# Patient Record
Sex: Male | Born: 1945 | Race: Black or African American | Hispanic: No | Smoking: Former smoker
Health system: Southern US, Community
[De-identification: ages and names within clinical notes are randomized; demographics above are authoritative.]

## PROBLEM LIST (undated history)

## (undated) DIAGNOSIS — C160 Malignant neoplasm of cardia: Secondary | ICD-10-CM

## (undated) DIAGNOSIS — E785 Hyperlipidemia, unspecified: Secondary | ICD-10-CM

## (undated) DIAGNOSIS — G4730 Sleep apnea, unspecified: Secondary | ICD-10-CM

## (undated) DIAGNOSIS — K5790 Diverticulosis of intestine, part unspecified, without perforation or abscess without bleeding: Secondary | ICD-10-CM

## (undated) DIAGNOSIS — R011 Cardiac murmur, unspecified: Secondary | ICD-10-CM

## (undated) DIAGNOSIS — A048 Other specified bacterial intestinal infections: Secondary | ICD-10-CM

## (undated) DIAGNOSIS — Z9221 Personal history of antineoplastic chemotherapy: Secondary | ICD-10-CM

## (undated) DIAGNOSIS — Z923 Personal history of irradiation: Secondary | ICD-10-CM

## (undated) DIAGNOSIS — D126 Benign neoplasm of colon, unspecified: Secondary | ICD-10-CM

## (undated) DIAGNOSIS — D649 Anemia, unspecified: Secondary | ICD-10-CM

## (undated) DIAGNOSIS — E119 Type 2 diabetes mellitus without complications: Secondary | ICD-10-CM

## (undated) DIAGNOSIS — I10 Essential (primary) hypertension: Secondary | ICD-10-CM

## (undated) DIAGNOSIS — K219 Gastro-esophageal reflux disease without esophagitis: Secondary | ICD-10-CM

## (undated) DIAGNOSIS — G709 Myoneural disorder, unspecified: Secondary | ICD-10-CM

## (undated) DIAGNOSIS — I4891 Unspecified atrial fibrillation: Secondary | ICD-10-CM

## (undated) HISTORY — DX: Sleep apnea, unspecified: G47.30

## (undated) HISTORY — PX: TONSILLECTOMY: SUR1361

## (undated) HISTORY — DX: Essential (primary) hypertension: I10

## (undated) HISTORY — DX: Personal history of irradiation: Z92.3

## (undated) HISTORY — DX: Diverticulosis of intestine, part unspecified, without perforation or abscess without bleeding: K57.90

## (undated) HISTORY — PX: ELBOW BURSA SURGERY: SHX615

## (undated) HISTORY — DX: Unspecified atrial fibrillation: I48.91

## (undated) HISTORY — DX: Other specified bacterial intestinal infections: A04.8

## (undated) HISTORY — DX: Myoneural disorder, unspecified: G70.9

## (undated) HISTORY — DX: Malignant neoplasm of cardia: C16.0

## (undated) HISTORY — DX: Cardiac murmur, unspecified: R01.1

## (undated) HISTORY — DX: Type 2 diabetes mellitus without complications: E11.9

## (undated) HISTORY — DX: Anemia, unspecified: D64.9

## (undated) HISTORY — PX: COLONOSCOPY: SHX174

## (undated) HISTORY — DX: Benign neoplasm of colon, unspecified: D12.6

## (undated) HISTORY — DX: Gastro-esophageal reflux disease without esophagitis: K21.9

## (undated) HISTORY — DX: Personal history of antineoplastic chemotherapy: Z92.21

## (undated) HISTORY — DX: Hyperlipidemia, unspecified: E78.5

## (undated) HISTORY — PX: CATARACT EXTRACTION: SUR2

---

## 1978-01-31 HISTORY — PX: LEG SURGERY: SHX1003

## 2002-10-08 ENCOUNTER — Emergency Department (HOSPITAL_COMMUNITY): Admission: EM | Admit: 2002-10-08 | Discharge: 2002-10-09 | Payer: Self-pay | Admitting: Emergency Medicine

## 2002-10-08 ENCOUNTER — Encounter: Payer: Self-pay | Admitting: Emergency Medicine

## 2002-12-11 ENCOUNTER — Encounter: Payer: Self-pay | Admitting: Internal Medicine

## 2004-03-12 ENCOUNTER — Emergency Department (HOSPITAL_COMMUNITY): Admission: EM | Admit: 2004-03-12 | Discharge: 2004-03-12 | Payer: Self-pay | Admitting: Emergency Medicine

## 2004-03-15 ENCOUNTER — Ambulatory Visit (HOSPITAL_BASED_OUTPATIENT_CLINIC_OR_DEPARTMENT_OTHER): Admission: RE | Admit: 2004-03-15 | Discharge: 2004-03-15 | Payer: Self-pay | Admitting: *Deleted

## 2004-03-19 ENCOUNTER — Ambulatory Visit: Payer: Self-pay

## 2004-06-24 ENCOUNTER — Ambulatory Visit: Payer: Self-pay | Admitting: Internal Medicine

## 2004-10-20 ENCOUNTER — Ambulatory Visit: Payer: Self-pay | Admitting: Internal Medicine

## 2004-10-27 ENCOUNTER — Ambulatory Visit: Payer: Self-pay | Admitting: Internal Medicine

## 2005-02-16 ENCOUNTER — Ambulatory Visit: Payer: Self-pay | Admitting: Internal Medicine

## 2005-02-17 ENCOUNTER — Observation Stay (HOSPITAL_COMMUNITY): Admission: EM | Admit: 2005-02-17 | Discharge: 2005-02-18 | Payer: Self-pay | Admitting: Emergency Medicine

## 2005-02-25 ENCOUNTER — Ambulatory Visit: Payer: Self-pay | Admitting: Internal Medicine

## 2005-03-01 ENCOUNTER — Encounter: Admission: RE | Admit: 2005-03-01 | Discharge: 2005-05-30 | Payer: Self-pay | Admitting: Internal Medicine

## 2005-03-11 ENCOUNTER — Ambulatory Visit: Payer: Self-pay | Admitting: Internal Medicine

## 2005-03-19 ENCOUNTER — Encounter: Payer: Self-pay | Admitting: Pulmonary Disease

## 2005-03-19 ENCOUNTER — Ambulatory Visit (HOSPITAL_BASED_OUTPATIENT_CLINIC_OR_DEPARTMENT_OTHER): Admission: RE | Admit: 2005-03-19 | Discharge: 2005-03-19 | Payer: Self-pay | Admitting: Internal Medicine

## 2005-03-21 ENCOUNTER — Ambulatory Visit: Payer: Self-pay

## 2005-03-21 ENCOUNTER — Encounter: Payer: Self-pay | Admitting: Cardiology

## 2005-03-23 ENCOUNTER — Ambulatory Visit: Payer: Self-pay | Admitting: Pulmonary Disease

## 2005-04-14 ENCOUNTER — Ambulatory Visit: Payer: Self-pay | Admitting: Pulmonary Disease

## 2005-04-25 ENCOUNTER — Ambulatory Visit: Payer: Self-pay | Admitting: Internal Medicine

## 2005-05-12 ENCOUNTER — Ambulatory Visit: Payer: Self-pay | Admitting: Pulmonary Disease

## 2005-10-18 ENCOUNTER — Ambulatory Visit: Payer: Self-pay | Admitting: Internal Medicine

## 2005-10-25 ENCOUNTER — Ambulatory Visit: Payer: Self-pay | Admitting: Internal Medicine

## 2005-11-11 ENCOUNTER — Ambulatory Visit: Payer: Self-pay | Admitting: Pulmonary Disease

## 2006-01-02 ENCOUNTER — Ambulatory Visit: Payer: Self-pay | Admitting: Internal Medicine

## 2006-11-02 ENCOUNTER — Ambulatory Visit: Payer: Self-pay | Admitting: Pulmonary Disease

## 2007-10-31 DIAGNOSIS — E785 Hyperlipidemia, unspecified: Secondary | ICD-10-CM | POA: Insufficient documentation

## 2007-10-31 DIAGNOSIS — D126 Benign neoplasm of colon, unspecified: Secondary | ICD-10-CM | POA: Insufficient documentation

## 2007-10-31 DIAGNOSIS — I480 Paroxysmal atrial fibrillation: Secondary | ICD-10-CM | POA: Insufficient documentation

## 2007-10-31 DIAGNOSIS — Z8679 Personal history of other diseases of the circulatory system: Secondary | ICD-10-CM

## 2007-11-01 ENCOUNTER — Ambulatory Visit: Payer: Self-pay | Admitting: Pulmonary Disease

## 2008-02-07 ENCOUNTER — Ambulatory Visit: Payer: Self-pay | Admitting: Internal Medicine

## 2008-02-21 ENCOUNTER — Encounter: Payer: Self-pay | Admitting: Internal Medicine

## 2008-02-21 ENCOUNTER — Ambulatory Visit: Payer: Self-pay | Admitting: Internal Medicine

## 2008-02-21 LAB — HM COLONOSCOPY

## 2008-02-23 ENCOUNTER — Encounter: Payer: Self-pay | Admitting: Internal Medicine

## 2008-06-09 ENCOUNTER — Telehealth: Payer: Self-pay | Admitting: Internal Medicine

## 2008-06-10 ENCOUNTER — Telehealth: Payer: Self-pay | Admitting: Internal Medicine

## 2008-09-22 ENCOUNTER — Ambulatory Visit: Payer: Self-pay | Admitting: Internal Medicine

## 2008-09-22 LAB — CONVERTED CEMR LAB
Alkaline Phosphatase: 50 units/L (ref 39–117)
BUN: 18 mg/dL (ref 6–23)
Basophils Absolute: 0 10*3/uL (ref 0.0–0.1)
Bilirubin, Direct: 0.1 mg/dL (ref 0.0–0.3)
CO2: 29 meq/L (ref 19–32)
Calcium: 9.5 mg/dL (ref 8.4–10.5)
Chloride: 106 meq/L (ref 96–112)
Cholesterol: 129 mg/dL (ref 0–200)
Creatinine, Ser: 1.3 mg/dL (ref 0.4–1.5)
Eosinophils Absolute: 0.2 10*3/uL (ref 0.0–0.7)
HDL: 48.4 mg/dL (ref >39.00)
Hemoglobin, Urine: NEGATIVE
LDL Cholesterol: 62 mg/dL (ref 0–99)
Lymphocytes Relative: 33.6 % (ref 12.0–46.0)
MCHC: 34 g/dL (ref 30.0–36.0)
MCV: 87.8 fL (ref 78.0–100.0)
Monocytes Absolute: 0.7 10*3/uL (ref 0.1–1.0)
Neutrophils Relative %: 51.4 % (ref 43.0–77.0)
Nitrite: NEGATIVE
PSA: 0.72 ng/mL (ref 0.10–4.00)
Platelets: 188 10*3/uL (ref 150.0–400.0)
RDW: 13.4 % (ref 11.5–14.6)
Specific Gravity, Urine: 1.025 (ref 1.000–1.030)
Total Bilirubin: 0.9 mg/dL (ref 0.3–1.2)
Total CHOL/HDL Ratio: 3
Total Protein, Urine: NEGATIVE mg/dL
Total Protein: 7.3 g/dL (ref 6.0–8.3)
Triglycerides: 91 mg/dL (ref 0.0–149.0)
pH: 6 (ref 5.0–8.0)

## 2008-09-29 ENCOUNTER — Ambulatory Visit: Payer: Self-pay | Admitting: Internal Medicine

## 2008-10-31 ENCOUNTER — Ambulatory Visit: Payer: Self-pay | Admitting: Pulmonary Disease

## 2008-10-31 DIAGNOSIS — G4733 Obstructive sleep apnea (adult) (pediatric): Secondary | ICD-10-CM | POA: Insufficient documentation

## 2009-01-31 DIAGNOSIS — A048 Other specified bacterial intestinal infections: Secondary | ICD-10-CM

## 2009-01-31 HISTORY — DX: Other specified bacterial intestinal infections: A04.8

## 2009-06-16 ENCOUNTER — Ambulatory Visit: Payer: Self-pay | Admitting: Internal Medicine

## 2009-06-16 DIAGNOSIS — R079 Chest pain, unspecified: Secondary | ICD-10-CM | POA: Insufficient documentation

## 2009-06-16 DIAGNOSIS — K219 Gastro-esophageal reflux disease without esophagitis: Secondary | ICD-10-CM | POA: Insufficient documentation

## 2009-06-17 ENCOUNTER — Telehealth: Payer: Self-pay | Admitting: Internal Medicine

## 2009-06-17 LAB — CONVERTED CEMR LAB
ALT: 19 units/L (ref 0–53)
Alkaline Phosphatase: 51 units/L (ref 39–117)
Basophils Relative: 0.5 % (ref 0.0–3.0)
Bilirubin Urine: NEGATIVE
Bilirubin, Direct: 0.1 mg/dL (ref 0.0–0.3)
Chloride: 105 meq/L (ref 96–112)
Creatinine, Ser: 1.2 mg/dL (ref 0.4–1.5)
Eosinophils Relative: 2.7 % (ref 0.0–5.0)
Hemoglobin, Urine: NEGATIVE
Hemoglobin: 12.4 g/dL — ABNORMAL LOW (ref 13.0–17.0)
Lymphocytes Relative: 28.6 % (ref 12.0–46.0)
MCV: 85.5 fL (ref 78.0–100.0)
Monocytes Absolute: 0.6 10*3/uL (ref 0.1–1.0)
Neutrophils Relative %: 57.4 % (ref 43.0–77.0)
RBC: 4.19 M/uL — ABNORMAL LOW (ref 4.22–5.81)
Sodium: 141 meq/L (ref 135–145)
Total Protein, Urine: NEGATIVE mg/dL
Total Protein: 7.2 g/dL (ref 6.0–8.3)
Urine Glucose: NEGATIVE mg/dL
WBC: 5.6 10*3/uL (ref 4.5–10.5)

## 2009-06-18 ENCOUNTER — Telehealth (INDEPENDENT_AMBULATORY_CARE_PROVIDER_SITE_OTHER): Payer: Self-pay | Admitting: *Deleted

## 2009-06-22 ENCOUNTER — Ambulatory Visit: Payer: Self-pay

## 2009-06-22 ENCOUNTER — Encounter (HOSPITAL_COMMUNITY): Admission: RE | Admit: 2009-06-22 | Discharge: 2009-09-02 | Payer: Self-pay | Admitting: Internal Medicine

## 2009-06-22 ENCOUNTER — Ambulatory Visit: Payer: Self-pay | Admitting: Cardiology

## 2009-06-30 ENCOUNTER — Ambulatory Visit: Payer: Self-pay | Admitting: Internal Medicine

## 2009-06-30 DIAGNOSIS — A048 Other specified bacterial intestinal infections: Secondary | ICD-10-CM | POA: Insufficient documentation

## 2009-08-17 ENCOUNTER — Ambulatory Visit: Payer: Self-pay | Admitting: Internal Medicine

## 2009-10-30 ENCOUNTER — Ambulatory Visit: Payer: Self-pay | Admitting: Pulmonary Disease

## 2009-11-13 ENCOUNTER — Ambulatory Visit: Payer: Self-pay | Admitting: Internal Medicine

## 2009-11-13 DIAGNOSIS — R42 Dizziness and giddiness: Secondary | ICD-10-CM | POA: Insufficient documentation

## 2009-11-15 ENCOUNTER — Emergency Department (HOSPITAL_COMMUNITY): Admission: EM | Admit: 2009-11-15 | Discharge: 2009-11-15 | Payer: Self-pay | Admitting: Emergency Medicine

## 2009-12-11 ENCOUNTER — Telehealth: Payer: Self-pay | Admitting: Internal Medicine

## 2009-12-22 ENCOUNTER — Ambulatory Visit: Payer: Self-pay | Admitting: Internal Medicine

## 2009-12-22 LAB — CONVERTED CEMR LAB
Alkaline Phosphatase: 47 units/L (ref 39–117)
Basophils Relative: 0.6 % (ref 0.0–3.0)
Bilirubin, Direct: 0.1 mg/dL (ref 0.0–0.3)
CO2: 28 meq/L (ref 19–32)
Calcium: 9.6 mg/dL (ref 8.4–10.5)
Creatinine, Ser: 1.3 mg/dL (ref 0.4–1.5)
Eosinophils Absolute: 0.2 10*3/uL (ref 0.0–0.7)
HDL: 42.5 mg/dL (ref >39.00)
Hemoglobin, Urine: NEGATIVE
LDL Cholesterol: 63 mg/dL (ref 0–99)
Lymphocytes Relative: 39 % (ref 12.0–46.0)
MCHC: 33.1 g/dL (ref 30.0–36.0)
Neutrophils Relative %: 45.6 % (ref 43.0–77.0)
PSA: 0.91 ng/mL (ref 0.10–4.00)
RBC: 4.75 M/uL (ref 4.22–5.81)
Specific Gravity, Urine: 1.02 (ref 1.000–1.030)
Total Bilirubin: 0.4 mg/dL (ref 0.3–1.2)
Total CHOL/HDL Ratio: 3
Total Protein, Urine: NEGATIVE mg/dL
Total Protein: 6.7 g/dL (ref 6.0–8.3)
Triglycerides: 98 mg/dL (ref 0.0–149.0)
Urine Glucose: NEGATIVE mg/dL
WBC: 5.5 10*3/uL (ref 4.5–10.5)
pH: 6.5 (ref 5.0–8.0)

## 2009-12-31 ENCOUNTER — Ambulatory Visit: Payer: Self-pay | Admitting: Internal Medicine

## 2009-12-31 DIAGNOSIS — E162 Hypoglycemia, unspecified: Secondary | ICD-10-CM | POA: Insufficient documentation

## 2009-12-31 DIAGNOSIS — R7309 Other abnormal glucose: Secondary | ICD-10-CM | POA: Insufficient documentation

## 2009-12-31 DIAGNOSIS — H6120 Impacted cerumen, unspecified ear: Secondary | ICD-10-CM | POA: Insufficient documentation

## 2009-12-31 DIAGNOSIS — D485 Neoplasm of uncertain behavior of skin: Secondary | ICD-10-CM | POA: Insufficient documentation

## 2010-01-31 DIAGNOSIS — C160 Malignant neoplasm of cardia: Secondary | ICD-10-CM

## 2010-01-31 HISTORY — DX: Malignant neoplasm of cardia: C16.0

## 2010-02-02 ENCOUNTER — Ambulatory Visit
Admission: RE | Admit: 2010-02-02 | Discharge: 2010-02-02 | Payer: Self-pay | Source: Home / Self Care | Attending: Internal Medicine | Admitting: Internal Medicine

## 2010-02-02 ENCOUNTER — Encounter: Payer: Self-pay | Admitting: Internal Medicine

## 2010-03-02 NOTE — Assessment & Plan Note (Signed)
Summary: CPX / NWS  #   Vital Signs:  Patient profile:   65 year old male Height:      71 inches Weight:      224 pounds BMI:     31.35 Temp:     97.7 degrees F oral Pulse rate:   76 / minute Pulse rhythm:   regular Resp:     16 per minute BP sitting:   120 / 70  (left arm) Cuff size:   regular  Vitals Entered By: Lanier Prude, Beverly Gust) (December 31, 2009 10:38 AM) CC: CPX Is Patient Diabetic? No   Primary Care Provider:  Tresa Garter MD  CC:  CPX.  History of Present Illness: The patient presents for a preventive health examination   Current Medications (verified): 1)  Antara 130 Mg Caps (Fenofibrate Micronized) .... Take 1 Tablet By Mouth Once A Day 2)  Diltiazem Hcl Er Beads 180 Mg Xr24h-Cap (Diltiazem Hcl Er Beads) .... Take 1 Tablet By Mouth Once A Day 3)  Aspirin Low Dose 81 Mg Tabs (Aspirin) .... Take 1 Tablet By Mouth Once A Day 4)  Vitamin D3 1000 Unit  Tabs (Cholecalciferol) .Marland Kitchen.. 1 By Mouth Daily 5)  Meclizine Hcl 12.5 Mg Tabs (Meclizine Hcl) .Marland Kitchen.. 1-2 By Mouth Qid As Needed Dizziness  Allergies (verified): No Known Drug Allergies  Past History:  Past Surgical History: Last updated: 06/16/2009 Tonsillectomy  Family History: Last updated: 09/29/2008 No CAD  Social History: Last updated: 09/29/2008 pt is divorced. pt has children. Patient states former smoker.  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    Physical Exam  General:  Well-developed,well-nourished,in no acute distress; alert,appropriate and cooperative throughout examination Head:  Normocephalic and atraumatic without obvious abnormalities. No apparent alopecia or balding. Eyes:  No corneal or conjunctival inflammation noted. EOMI. Perrla.  Ears:  R wax Nose:  External nasal examination shows no deformity or inflammation. Nasal mucosa are pink and moist without lesions or exudates. Mouth:  Oral mucosa and oropharynx without lesions or exudates.  Teeth in good repair. Neck:  No deformities, masses, or tenderness noted. Lungs:  Normal respiratory effort, chest expands symmetrically. Lungs are clear to auscultation, no crackles or wheezes. Heart:  Normal rate and regular rhythm. S1 and S2 normal without gallop, murmur, click, rub or other extra sounds. Abdomen:  Bowel sounds positive,abdomen soft and non-tender without masses, organomegaly or hernias noted. Rectal:  No external abnormalities noted. Normal sphincter tone. No rectal masses or tenderness. Genitalia:  Testes bilaterally descended without nodularity, tenderness or masses. No scrotal masses or lesions. No penis lesions or urethral discharge. Prostate:  Prostate gland firm and smooth, no enlargement, nodularity, tenderness, mass, asymmetry or induration. Msk:  No deformity or scoliosis noted of thoracic or lumbar spine.   Pulses:  R and L carotid,radial,femoral,dorsalis pedis and posterior tibial pulses are full and equal bilaterally Extremities:  No clubbing, cyanosis, edema, or deformity noted with normal full range of motion  of all joints.   Neurologic:  wnl Skin:  R flank/abd dark mole 4 mm irreg Psych:  Cognition and judgment appear intact. Alert and cooperative with  normal attention span and concentration. No apparent delusions, illusions, hallucinations   Impression & Recommendations:  Problem # 1:  PHYSICAL EXAMINATION (ICD-V70.0) Assessment New Health and age related issues were discussed. Available screening tests and vaccinations were discussed as well. Healthy life style including good diet and exercise was discussed.  The labs were reviewed with the patient.  He had an EKG in Oct 2011 at [facility]  Problem # 2:  VERTIGO (ICD-780.4) resolved Assessment: Improved  His updated medication list for this problem includes:    Meclizine Hcl 12.5 Mg Tabs (Meclizine hcl) .Marland Kitchen... 1-2 by mouth qid as needed dizziness  Problem # 3:  ATRIAL FIBRILLATION, HX OF (ICD-V12.59) Assessment: Comment Only On the regimen of medicine(s) reflected in the chart    Problem # 4:  DYSLIPIDEMIA (ICD-272.4) Assessment: Unchanged  His updated medication list for this problem includes:    Antara 130 Mg Caps (Fenofibrate micronized) .Marland Kitchen... Take 1 tablet by mouth once a day  Labs Reviewed: SGOT: 28 (12/22/2009)   SGPT: 20 (12/22/2009)   HDL:42.50 (12/22/2009), 48.40 (09/22/2008)  LDL:63 (12/22/2009), 62 (09/22/2008)  Chol:125 (12/22/2009), 129 (09/22/2008)  Trig:98.0 (12/22/2009), 91.0 (09/22/2008)  Problem # 5:  HYPERGLYCEMIA (ICD-790.29) Assessment: New Loose wt Recheck in 6 months   Problem # 6:  CERUMEN IMPACTION (ICD-380.4) R irrigate at home  Problem # 7:  NEOPLASM OF UNCERTAIN BEHAVIOR OF SKIN (ICD-238.2) Assessment: New skin biopsy   Complete Medication List: 1)  Antara 130 Mg Caps (Fenofibrate micronized) .... Take 1 tablet by mouth once a day 2)  Diltiazem Hcl Er Beads 180 Mg Xr24h-cap (Diltiazem hcl er beads) .... Take 1 tablet by mouth once a day 3)  Aspirin Low Dose 81 Mg Tabs (Aspirin) .... Take 1 tablet by mouth once a day 4)  Vitamin D3 1000 Unit Tabs (Cholecalciferol) .Marland Kitchen.. 1 by mouth daily 5)  Meclizine Hcl 12.5 Mg Tabs (Meclizine hcl) .Marland Kitchen.. 1-2 by  mouth qid as needed dizziness  Other Orders: Zoster (Shingles) Vaccine Live [phone]) Admin 1st Vaccine (03474)  Patient Instructions: 1)  Skin bx w/me 1-2 months 2)  Please schedule a follow-up appointment in 6 months. 3)  BMP prior to visit, ICD-9: 4)  HbgA1C prior to visit, ICD-9:790.29 Prescriptions: MECLIZINE HCL 12.5 MG TABS (MECLIZINE HCL) 1-2 by mouth qid as needed dizziness  #60 x 1   Entered and Authorized by:   Tresa Garter MD   Signed by:   Tresa Garter MD on 12/31/2009   Method used:   Print then Give to Patient   RxID:   [id number] DILTIAZEM HCL ER BEADS 180 MG XR24H-CAP (DILTIAZEM HCL ER BEADS) Take 1 tablet by mouth once a day  #30 Capsule x 11   Entered and Authorized by:   Tresa Garter MD   Signed by:   Tresa Garter MD on 12/31/2009   Method used:   Print then Give to Patient   RxID:   [id number] ANTARA 130 MG CAPS (FENOFIBRATE MICRONIZED) Take 1 tablet by mouth once a day  #30 Capsule x 11   Entered and Authorized by:   Tresa Garter MD   Signed by:   Tresa Garter MD on 12/31/2009   Method used:   Print then Give to Patient   RxID:   [id number]    Orders Added:  1)  Est. Patient age 57-64 [62] 2)  Zoster (Shingles) Vaccine Live [phone] 3)  Admin 1st Vaccine [phone]   Immunization History:  Tetanus/Td Immunization History:    Tetanus/Td:  historical (03/18/2004)  Immunizations Administered:  Zostavax # 1:    Vaccine Type: Zostavax    Site: left deltoid    Mfr: Merck    Dose: 0.75ml    Route: [place]    Given by: Lanier Prude, CMA(AAMA)    Exp. Date: 09/04/2010    Lot #: [id number]    VIS given: 11/12/04 given December 31, 2009.    Immunization History:  Tetanus/Td Immunization History:    Tetanus/Td:  Historical (03/18/2004)  Immunizations Administered:  Zostavax # 1:    Vaccine Type: Zostavax    Site: left deltoid    Mfr: Merck    Dose: 0.18ml    Route: [place]    Given by: Lanier Prude, CMA(AAMA)    Exp. Date: 09/04/2010    Lot #: [id number]    VIS given: 11/12/04 given December 31, 2009.

## 2010-03-02 NOTE — Assessment & Plan Note (Signed)
Summary: THINGS SPINS WHEN LIE DOWN   STC   Vital Signs:  Patient profile:   65 year old male Height:      71 inches Weight:      224 pounds BMI:     31.35 Temp:     98.0 degrees F oral Pulse rate:   76 / minute Pulse rhythm:   regular Resp:     16 per minute BP sitting:   120 / 80  (left arm) Cuff size:   regular  Vitals Entered By: Lanier Prude, Beverly Gust) (November 13, 2009 2:44 PM) CC: dizziness while bending over and lying down Is Patient Diabetic? No   Primary Care Provider:  Georgina Quint Itzel Mckibbin MD  CC:  dizziness while bending over and lying down.  History of Present Illness: C/o spinning sensation with head turning since Tue - severe. No URI. No falls. No HA  Current Medications (verified): 1)  Antara 130 Mg Caps (Fenofibrate Micronized) .... Take 1 Tablet By Mouth Once A Day 2)  Diltiazem Hcl Er Beads 180 Mg Xr24h-Cap (Diltiazem Hcl Er Beads) .... Take 1 Tablet By Mouth Once A Day 3)  Aspirin Low Dose 81 Mg Tabs (Aspirin) .... Take 1 Tablet By Mouth Once A Day 4)  Vitamin D3 1000 Unit  Tabs (Cholecalciferol) .Marland Kitchen.. 1 By Mouth Daily  Allergies (verified): No Known Drug Allergies  Past History:  Past Medical History: Last updated: 06/30/2009 Current Problems:  ATRIAL FIBRILLATION, HX OF (ICD-V12.59) POLYP, COLON (ICD-211.3) DYSLIPIDEMIA (ICD-272.4) SLEEP APNEA (ICD-780.57) Colon 02/2008 Dr Marina Goodell Glaucoma Cataract GERD H pyloti (+) 2011  Social History: Last updated: 09/29/2008 pt is divorced. pt has children. Patient states former smoker.  Occupation:physical work  Review of Systems  The patient denies fever, chest pain, syncope, and headaches.    Physical Exam  General:  Well-developed,well-nourished,in no acute distress; alert,appropriate and cooperative throughout examination Ears:  External ear exam shows no significant lesions or deformities.  Otoscopic examination reveals clear canals, tympanic membranes are intact bilaterally without  bulging, retraction, inflammation or discharge. Hearing is grossly normal bilaterally. Nose:  External nasal examination shows no deformity or inflammation. Nasal mucosa are pink and moist without lesions or exudates. Mouth:  Oral mucosa and oropharynx without lesions or exudates.  Teeth in good repair. Lungs:  Normal respiratory effort, chest expands symmetrically. Lungs are clear to auscultation, no crackles or wheezes. Heart:  Normal rate and regular rhythm. S1 and S2 normal without gallop, murmur, click, rub or other extra sounds. Abdomen:  Bowel sounds positive,abdomen soft and non-tender without masses, organomegaly or hernias noted. Neurologic:  H-P (+) on R Skin:  Intact without suspicious lesions or rashes Psych:  Cognition and judgment appear intact. Alert and cooperative with normal attention span and concentration. No apparent delusions, illusions, hallucinations   Impression & Recommendations:  Problem # 1:  VERTIGO (ICD-780.4) BPV Assessment New See "Patient Instructions".  His updated medication list for this problem includes:    Meclizine Hcl 12.5 Mg Tabs (Meclizine hcl) .Marland Kitchen... 1-2 by mouth qid as needed dizziness  Problem # 2:  ATRIAL FIBRILLATION, HX OF (ICD-V12.59) Assessment: Comment Only Doing well  Complete Medication List: 1)  Antara 130 Mg Caps (Fenofibrate micronized) .... Take 1 tablet by mouth once a day 2)  Diltiazem Hcl Er Beads 180 Mg Xr24h-cap (Diltiazem hcl er beads) .... Take 1 tablet by mouth once a day 3)  Aspirin Low Dose 81 Mg Tabs (Aspirin) .... Take 1 tablet by mouth once a day 4)  Vitamin D3 1000 Unit Tabs (Cholecalciferol) .Marland Kitchen.. 1 by mouth daily 5)  Meclizine Hcl 12.5 Mg Tabs (Meclizine hcl) .Marland Kitchen.. 1-2 by mouth qid as needed dizziness  Patient Instructions: 1)  Francee Piccolo - Daroff exercise  2)  Call if you are not better in a reasonable amount of time or if worse.  Prescriptions: MECLIZINE HCL 12.5 MG TABS (MECLIZINE HCL) 1-2 by mouth qid as needed  dizziness  #60 x 1   Entered and Authorized by:   Tresa Garter MD   Signed by:   Tresa Garter MD on 11/13/2009   Method used:   Electronically to        [facility]  [address] [phone]* (retail)       [address]       [place], [place]  [address]       Ph: [phone]       Fax: [phone]   RxID:   [id number]

## 2010-03-02 NOTE — Assessment & Plan Note (Signed)
Summary: Cardiology Nuclear Study  Nuclear Med Background Indications for Stress Test: Evaluation for Ischemia   History: Echo  History Comments: '07 Echo:normal LVF; h/o afib, OSA  Symptoms: Chest Pain, Chest Tightness  Symptoms Comments: Last episode of ZO:XWRU week   Nuclear Pre-Procedure Cardiac Risk Factors: History of Smoking, Lipids Caffeine/Decaff Intake: None NPO After: 10:00 PM Lungs: Clear IV 0.9% NS with Angio Cath: 18g     IV Site: (R) AC IV Started by: Stanton Kidney EMT-P Chest Size (in) 46     Height (in): 71 Weight (lb): 221 BMI: 30.93 Tech Comments: No a.m. meds. taken  Nuclear Med Study 1 or 2 day study:  1 day     Stress Test Type:  Stress Reading MD:  Willa Rough, MD     Referring MD:  Sonda Primes, MD Resting Radionuclide:  Technetium 106m Tetrofosmin     Resting Radionuclide Dose:  10.5 mCi  Stress Radionuclide:  Technetium 66m Tetrofosmin     Stress Radionuclide Dose:  33 mCi   Stress Protocol Exercise Time (min):  7:16 min     Max HR:  144 bpm     Predicted Max HR:  156 bpm  Max Systolic BP: 185 mm Hg     Percent Max HR:  92.31 %     METS: 9.1 Rate Pressure Product:  04540    Stress Test Technologist:  Rea College CMA-N     Nuclear Technologist:  Domenic Polite CNMT  Rest Procedure  Myocardial perfusion imaging was performed at rest 45 minutes following the intravenous administration of Myoview Technetium 84m Tetrofosmin.  Stress Procedure  The patient exercised for 7:16.  The patient stopped due to fatigue and denied any chest pain.  There were no diagnostic ST-T wave changes, only occasional PVC's with couplets and occasional PAC's.  Myoview was injected at peak exercise and myocardial perfusion imaging was performed after a brief delay.  QPS Raw Data Images:  Patient motion noted; appropriate software correction applied. Stress Images:  There is decreased activity in the inferoapical wall Rest Images:  Similar to stress Subtraction  (SDS):  Some reversibility in the inferior wall Transient Ischemic Dilatation:  .95  (Normal <1.22)  Lung/Heart Ratio:  .27  (Normal <0.45)  Quantitative Gated Spect Images QGS EDV:  111 ml QGS ESV:  42 ml QGS EF:  62 % QGS cine images:  Good motion.  Findings Abnormal      Overall Impression  Exercise Capacity: Fair exercise capacity. BP Response: Normal blood pressure response. Clinical Symptoms: SOB ECG Impression: No significant ST segment change suggestive of ischemia. Overall Impression Comments: There is interference from visceral nuclear activity that may affect the findings. However, the images and quantitative data suggests that the is inferior ischemia and possibly mild scar also.

## 2010-03-02 NOTE — Progress Notes (Signed)
Summary: RESULTS  Phone Note From Other Clinic   Summary of Call: Pt informed of results, rx sent in  Initial call taken by: Lamar Sprinkles, CMA,  Jun 17, 2009 5:30 PM    New/Updated Medications: PREVPAC  MISC (AMOXICILL-CLARITHRO-LANSOPRAZ) as directed Prescriptions: PREVPAC  MISC (AMOXICILL-CLARITHRO-LANSOPRAZ) as directed  #1 x 0   Entered by:   Lamar Sprinkles, CMA   Authorized by:   Tresa Garter MD   Signed by:   Lamar Sprinkles, CMA on 06/17/2009   Method used:   [facility]        [facility]  [address] [phone]* (retail)       [address]       [place], [place]  [address]       Ph: [phone]       Fax: [phone]   RxID:   [id number]

## 2010-03-02 NOTE — Progress Notes (Signed)
Summary: Nuclear Pre-Procedure  Phone Note Outgoing Call Call back at [facility] Phone [phone]   Call placed by: Stanton Kidney, EMT-P,  Jun 18, 2009 12:16 PM Call placed to: Patient Action Taken: Phone Call Completed Summary of Call: Reviewed information on Myoview Information Sheet (see scanned document for further details).  Spoke with Patient.    Nuclear Med Background Indications for Stress Test: Evaluation for Ischemia   History: Echo  History Comments: '07 Echo: NL LV Fx  Symptoms: Chest Pain, Chest Tightness    Nuclear Pre-Procedure Cardiac Risk Factors: History of Smoking, Lipids Height (in): 71

## 2010-03-02 NOTE — Assessment & Plan Note (Signed)
Summary: 2 WK FU  STC   Vital Signs:  Patient profile:   65 year old male Height:      71 inches (180.34 cm) Weight:      219.2 pounds (99.64 kg) O2 Sat:      96 % on Room air Temp:     98.6 degrees F (37.00 degrees C) oral Pulse rate:   54 / minute BP sitting:   110 / 72  (left arm) Cuff size:   large  Vitals Entered By: Orlan Leavens (Jun 30, 2009 10:24 AM)  O2 Flow:  Room air CC: 2 week f/u Is Patient Diabetic? No Pain Assessment Patient in pain? no        CC:  2 week f/u.  History of Present Illness: F/u CP, GERD and H pilori (+) test Doing better after Rx No CP lately  Current Medications (verified): 1)  Antara 130 Mg Caps (Fenofibrate Micronized) .... Take 1 Tablet By Mouth Once A Day 2)  Diltiazem Hcl Er Beads 180 Mg Xr24h-Cap (Diltiazem Hcl Er Beads) .... Take 1 Tablet By Mouth Once A Day 3)  Aspirin Low Dose 81 Mg Tabs (Aspirin) .... Take 1 Tablet By Mouth Once A Day 4)  Vitamin D3 1000 Unit  Tabs (Cholecalciferol) .Marland Kitchen.. 1 By Mouth Daily 5)  Vimovo 500-20 Mg Tbec (Naproxen-Esomeprazole) .Marland Kitchen.. 1 By Mouth  Two Times A Day Pc As Needed Pain 6)  Prevpac  Misc (Amoxicill-Clarithro-Lansopraz) .... As Directed  Allergies (verified): No Known Drug Allergies  Past History:  Social History: Last updated: 09/29/2008 pt is divorced. pt has children. Patient states former smoker.  Occupation:physical work  Past Medical History: Current Problems:  ATRIAL FIBRILLATION, HX OF (ICD-V12.59) POLYP, COLON (ICD-211.3) DYSLIPIDEMIA (ICD-272.4) SLEEP APNEA (ICD-780.57) Colon 02/2008 Dr Marina Goodell Glaucoma Cataract GERD H pyloti (+) 2011  Review of Systems  The patient denies chest pain, dyspnea on exertion, abdominal pain, and melena.    Physical Exam  General:  Well-developed,well-nourished,in no acute distress; alert,appropriate and cooperative throughout examination Nose:  External nasal examination shows no deformity or inflammation. Nasal mucosa are pink and moist  without lesions or exudates. Mouth:  Oral mucosa and oropharynx without lesions or exudates.  Teeth in good repair. Neck:  No deformities, masses, or tenderness noted. Lungs:  Normal respiratory effort, chest expands symmetrically. Lungs are clear to auscultation, no crackles or wheezes. Heart:  Normal rate and regular rhythm. S1 and S2 normal without gallop, murmur, click, rub or other extra sounds. Abdomen:  Bowel sounds positive,abdomen soft and non-tender without masses, organomegaly or hernias noted. Msk:  No deformity or scoliosis noted of thoracic or lumbar spine.   Extremities:  No clubbing, cyanosis, edema, or deformity noted with normal full range of motion of all joints.   Neurologic:  No cranial nerve deficits noted. Station and gait are normal. Plantar reflexes are down-going bilaterally. DTRs are symmetrical throughout. Sensory, motor and coordinative functions appear intact. Skin:  Intact without suspicious lesions or rashes Psych:  Cognition and judgment appear intact. Alert and cooperative with normal attention span and concentration. No apparent delusions, illusions, hallucinations   Impression & Recommendations:  Problem # 1:  CHEST PAIN (ICD-786.50) Assessment Improved CL with possible inferior ischemia - appt w/Dr Tenny Craw is pending   Problem # 2:  ATRIAL FIBRILLATION, HX OF (ICD-V12.59) Assessment: Comment Only On prescription drug  therapy   Problem # 3:  GERD (ICD-530.81) Assessment: Improved  His updated medication list for this problem includes:    Prevpac  Misc (Amoxicill-clarithro-lansopraz) .Marland Kitchen... As directed    Omeprazole 40 Mg Cpdr (Omeprazole) .Marland Kitchen... 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 Take 1 tablet by mouth once a day 2)  Diltiazem Hcl Er Beads 180 Mg Xr24h-cap (Diltiazem  hcl er beads) .... Take 1 tablet by mouth once a day 3)  Aspirin Low Dose 81 Mg Tabs (Aspirin) .... Take 1 tablet by mouth once a day 4)  Vitamin D3 1000 Unit Tabs (Cholecalciferol) .Marland Kitchen.. 1 by mouth daily 5)  Prevpac Misc (Amoxicill-clarithro-lansopraz) .... As directed 6)  Omeprazole 40 Mg Cpdr (Omeprazole) .Marland Kitchen.. 1 by mouth qam for indigestion x 6 wks then prn  Patient Instructions: 1)  Please schedule a follow-up appointment in 6 months. Prescriptions: OMEPRAZOLE 40 MG CPDR (OMEPRAZOLE) 1 by mouth qam for indigestion x 6 wks then prn  #30 x 3   Entered and Authorized by:   Tresa Garter MD   Signed by:   Tresa Garter MD on 06/30/2009   Method used:   Electronically to        [facility] [phone]* (retail)       [address]       [place], [place]  [address]       Ph: [phone]       Fax: [phone]   RxID:   [id number]

## 2010-03-02 NOTE — Progress Notes (Signed)
Summary: rx refill  Phone Note Refill Request   Refills Requested: Medication #1:  ANTARA 130 MG CAPS Take 1 tablet by mouth once a day  Medication #2:  DILTIAZEM HCL ER BEADS 180 MG XR24H-CAP Take 1 tablet by mouth once a day [facility]  [id number]   Method Requested: Telephone to Pharmacy Initial call taken by: Roe Coombs,  December 11, 2009 11:42 AM  Follow-up for Phone Call       Follow-up by: Judithe Modest CMA,  December 11, 2009 2:03 PM    Prescriptions: DILTIAZEM HCL ER BEADS 180 MG XR24H-CAP (DILTIAZEM HCL ER BEADS) Take 1 tablet by mouth once a day  #30 Each x 4   Entered by:   Judithe Modest CMA   Authorized by:   Sherrill Raring, MD, [facility]   Signed by:   Judithe Modest CMA on 12/11/2009   Method used:   [facility]  [address] [id number]ail[facility]       [address]       [place], [place]  [address]       Ph: [phone]       Fax: [phone]   RxID:   [id number] ANTARA 130 MG CAPS (FENOFIBRATE MICRONIZED) Take 1 tablet by mouth once a day  #30 Each x 4   Entered by:   Judithe Modest CMA   Authorized by:   Sherrill Raring, MD, [facility]   Signed by:   Judithe Modest CMA on 12/11/2009   Method used:   [facility]        [facility]  [address] [id number]ail[facility]       [address]       [place], [place]  [address]       Ph: [phone]       Fax: [phone]   RxID:   [id number]

## 2010-03-02 NOTE — Assessment & Plan Note (Signed)
Summary: rov for osa   Visit Type:  Follow-up Primary Provider/Referring Provider:  Tresa Garter MD  CC:  1 year follow up. Pt states he uses his cpap 7/7 nights x 6-8 hrs a night. Pt states he is needing a new head gear for his cpap. Pt states he has no problems with his machine and pressure is set fine. Marland Kitchen  History of Present Illness: the pt comes in today for f/u of his known osa.  He is wearing cpap compliantly, and has no issues with mask tolerance or pressure.  He feels that he is sleeping well, and has adequate daytime alertness.  His only complaint is concerning his headgear, which he feels needs to be replaced.  His weight is up 3 pounds.  Current Medications (verified): 1)  Antara 130 Mg Caps (Fenofibrate Micronized) .... Take 1 Tablet By Mouth Once A Day 2)  Diltiazem Hcl Er Beads 180 Mg Xr24h-Cap (Diltiazem Hcl Er Beads) .... Take 1 Tablet By Mouth Once A Day 3)  Aspirin Low Dose 81 Mg Tabs (Aspirin) .... Take 1 Tablet By Mouth Once A Day 4)  Vitamin D3 1000 Unit  Tabs (Cholecalciferol) .Marland Kitchen.. 1 By Mouth Daily  Allergies (verified): No Known Drug Allergies  Past History:  Past medical, surgical, family and social histories (including risk factors) reviewed, and no changes noted (except as noted below).  Past Medical History: Reviewed history from 06/30/2009 and no changes required. Current Problems:  ATRIAL FIBRILLATION, HX OF (ICD-V12.59) POLYP, COLON (ICD-211.3) DYSLIPIDEMIA (ICD-272.4) SLEEP APNEA (ICD-780.57) Colon 02/2008 Dr Marina Goodell Glaucoma Cataract GERD H pyloti (+) 2011  Past Surgical History: Reviewed history from 06/16/2009 and no changes required. Tonsillectomy  Family History: Reviewed history from 09/29/2008 and no changes required. No CAD  Social History: Reviewed history from 09/29/2008 and no changes required. pt is divorced. pt has children. Patient states former smoker.  Occupation:physical work  Review of Systems  The patient  denies shortness of breath with activity, shortness of breath at rest, productive cough, non-productive cough, coughing up blood, chest pain, irregular heartbeats, acid heartburn, indigestion, loss of appetite, weight change, abdominal pain, difficulty swallowing, sore throat, tooth/dental problems, headaches, nasal congestion/difficulty breathing through nose, sneezing, itching, ear ache, anxiety, depression, hand/feet swelling, joint stiffness or pain, rash, change in color of mucus, and fever.    Vital Signs:  Patient profile:   65 year old male Height:      71 inches Weight:      225 pounds O2 Sat:      95 % on Room air Temp:     97.9 degrees F oral Pulse rate:   49 / minute BP sitting:   124 / 78  (right arm) Cuff size:   large  Vitals Entered By: Carver Fila (October 30, 2009 9:07 AM)  O2 Flow:  Room air CC: 1 year follow up. Pt states he uses his cpap 7/7 nights x 6-8 hrs a night. Pt states he is needing a new head gear for his cpap. Pt states he has no problems with his machine and pressure is set fine.  Comments meds and allergies updated Phone number updated Carver Fila  October 30, 2009 9:08 AM    Physical Exam  General:  ow male in nad Nose:  no skin breakdown or pressure necrosis from cpap mask Extremities:  no edema or cyanosis  Neurologic:  alert and oriented,  moves all 4. does not appear sleepy.   Impression & Recommendations:  Problem # 1:  OBSTRUCTIVE SLEEP APNEA (ICD-327.23) the pt is doing well with cpap, and feels that he sleeps well with adequate daytime alertness.  Will arrange for him to get a new mask and headgear, and I have encouraged him to work on weight loss.  Will see him back in one year for followup.  Other Orders: Est. Patient Level III ([address]109) DME Referral (DME)  Patient Instructions: 1)  continue on cpap 2)  work on weight loss 3)  followup with in one year.

## 2010-03-02 NOTE — Assessment & Plan Note (Signed)
Summary: EC6   Visit Type:  Follow-up Primary Provider:  Tresa Garter MD  CC:  No complains.  History of Present Illness: IDENTIFICATION:  [name] is a 65 year old gentleman who I saw back in February with history of palpitations, dyslipidemia by report, atrial fibrillation.   I saw him last in 2007. He was seen recenlty by A. Plotinikov.  He had chest pressure, gas senstation.  Swallowing hurt. He was treated for H Pylori and his symptoms resolved.  He denies any problems with exertion.  No dyspnea.  No palpitations.  Current Medications (verified): 1)  Antara 130 Mg Caps (Fenofibrate Micronized) .... Take 1 Tablet By Mouth Once A Day 2)  Diltiazem Hcl Er Beads 180 Mg Xr24h-Cap (Diltiazem Hcl Er Beads) .... Take 1 Tablet By Mouth Once A Day 3)  Aspirin Low Dose 81 Mg Tabs (Aspirin) .... Take 1 Tablet By Mouth Once A Day 4)  Vitamin D3 1000 Unit  Tabs (Cholecalciferol) .Marland Kitchen.. 1 By Mouth Daily  Allergies (verified): No Known Drug Allergies  Past History:  Past medical, surgical, family and social histories (including risk factors) reviewed, and no changes noted (except as noted below).  Past Medical History: Reviewed history from 06/30/2009 and no changes required. Current Problems:  ATRIAL FIBRILLATION, HX OF (ICD-V12.59) POLYP, COLON (ICD-211.3) DYSLIPIDEMIA (ICD-272.4) SLEEP APNEA (ICD-780.57) Colon 02/2008 Dr Marina Goodell Glaucoma Cataract GERD H pyloti (+) 2011  Past Surgical History: Reviewed history from 06/16/2009 and no changes required. Tonsillectomy  Family History: Reviewed history from 09/29/2008 and no changes required. No CAD  Social History: Reviewed history from 09/29/2008 and no changes required. pt is divorced. pt has children. Patient states former smoker.  Occupation:physical work  Review of Systems       All systems reviewed.  negative to the above problem except as noted above.  Vital Signs:  Patient profile:   65 year old  male Height:      71 inches Weight:      222.75 pounds BMI:     31.18 Pulse rate:   52 / minute Pulse rhythm:   regular Resp:     18 per minute BP sitting:   124 / 79  (left arm) Cuff size:   large  Vitals Entered By: Vikki Ports (August 17, 2009 11:47 AM)  Physical Exam  Additional Exam:  Patient is in NAD HEENT:  Normocephalic, atraumatic. EOMI, PERRLA.  Neck: JVP is normal. No thyromegaly. No bruits.  Lungs: clear to auscultation. No rales no wheezes.  Heart: Regular rate and rhythm. Normal S1, S2. No S3.   No significant murmurs. PMI not displaced.  Abdomen:  Supple, nontender. Normal bowel sounds. No masses. No hepatomegaly.  Extremities:   Good distal pulses throughout. No lower extremity edema.  Musculoskeletal :moving all extremities.  Neuro:   alert and oriented x3.    Impression & Recommendations:  Problem # 1:  CHEST PAIN (ICD-786.50) Sounds GI in nature.  Resolved after rx for H pylori.  Problem # 2:  ATRIAL FIBRILLATION, HX OF (ICD-V12.59) No symptomatic recurrence.  Keep on curent regimen.  Problem # 3:  DYSLIPIDEMIA (ICD-272.4) Lipids excellent on current regimen. His updated medication list for this problem includes:    Antara 130 Mg Caps (Fenofibrate micronized) .Marland Kitchen... Take 1 tablet by mouth once a day  Patient Instructions: 1)  Follow up in 2 years or as needed.

## 2010-03-02 NOTE — Assessment & Plan Note (Signed)
Summary: pain in center of chest x1 month/cd   Vital Signs:  Patient profile:   65 year old male Height:      71 inches Weight:      225.50 pounds BMI:     31.56 O2 Sat:      97 % on Room air Temp:     97.0 degrees F oral Pulse rate:   61 / minute BP sitting:   114 / 70  (left arm) Cuff size:   regular  Vitals Entered By: Lucious Groves (Jun 16, 2009 10:21 AM)  O2 Flow:  Room air CC: C/O chest pain x1 month./kb Is Patient Diabetic? No Pain Assessment Patient in pain? no      Comments Patient states that the discomfort is related to his breathing and feels like a chest cold without other symptoms./kb   CC:  C/O chest pain x1 month./kb.  History of Present Illness: C/o tightness in mid chest off and on all day - like a chest cold, nagging. He is having it now. It would last x hrs. No worse w/exertion. No SOB. C/o GERD.  Current Medications (verified): 1)  Antara 130 Mg Caps (Fenofibrate Micronized) .... Take 1 Tablet By Mouth Once A Day 2)  Diltiazem Hcl Er Beads 180 Mg Xr24h-Cap (Diltiazem Hcl Er Beads) .... Take 1 Tablet By Mouth Once A Day 3)  Aspirin Low Dose 81 Mg Tabs (Aspirin) .... Take 1 Tablet By Mouth Once A Day 4)  Vitamin D3 1000 Unit  Tabs (Cholecalciferol) .Marland Kitchen.. 1 By Mouth Daily  Allergies (verified): No Known Drug Allergies  Past History:  Family History: Last updated: 09/29/2008 No CAD  Social History: Last updated: 09/29/2008 pt is divorced. pt has children. Patient states former smoker.  Occupation:physical work  Past Medical History: Current Problems:  ATRIAL FIBRILLATION, HX OF (ICD-V12.59) POLYP, COLON (ICD-211.3) DYSLIPIDEMIA (ICD-272.4) SLEEP APNEA (ICD-780.57) Colon 02/2008 Dr Marina Goodell Glaucoma Cataract GERD  Past Surgical History: Tonsillectomy  Family History: Reviewed history from 09/29/2008 and no changes required. No CAD  Social History: Reviewed history from 09/29/2008 and no changes required. pt is divorced. pt has  children. Patient states former smoker.  Occupation:physical work  Review of Systems       The patient complains of chest pain.  The patient denies fever, hoarseness, syncope, dyspnea on exertion, and abdominal pain.    Physical Exam  General:  Well-developed,well-nourished,in no acute distress; alert,appropriate and cooperative throughout examination Nose:  External nasal examination shows no deformity or inflammation. Nasal mucosa are pink and moist without lesions or exudates. Mouth:  Oral mucosa and oropharynx without lesions or exudates.  Teeth in good repair. Neck:  No deformities, masses, or tenderness noted. Lungs:  Normal respiratory effort, chest expands symmetrically. Lungs are clear to auscultation, no crackles or wheezes. Heart:  Normal rate and regular rhythm. S1 and S2 normal without gallop, murmur, click, rub or other extra sounds. Abdomen:  Bowel sounds positive,abdomen soft and non-tender without masses, organomegaly or hernias noted. Msk:  No deformity or scoliosis noted of thoracic or lumbar spine.   Extremities:  No clubbing, cyanosis, edema, or deformity noted with normal full range of motion of all joints.   Neurologic:  No cranial nerve deficits noted. Station and gait are normal. Plantar reflexes are down-going bilaterally. DTRs are symmetrical throughout. Sensory, motor and coordinative functions appear intact. Skin:  Intact without suspicious lesions or rashes Psych:  Cognition and judgment appear intact. Alert and cooperative with normal attention span and concentration. No  apparent delusions, illusions, hallucinations   Impression & Recommendations:  Problem # 1:  CHEST PAIN (ICD-786.50) likely noncadiac. R/o pUD Assessment New  Just in case: baby ASA  Orders: T-2 View CXR, Same Day (71020.5TC) EKG w/ Interpretation (93000) Cardiolite (Cardiolite) TLB-BMP (Basic Metabolic Panel-BMET) (80048-METABOL) TLB-CBC Platelet - w/Differential  (85025-CBCD) TLB-Hepatic/Liver Function Pnl (80076-HEPATIC) TLB-TSH (Thyroid Stimulating Hormone) (84443-TSH) TLB-Udip ONLY (81003-UDIP) TLB-H. Pylori Abs(Helicobacter Pylori) (86677-HELICO) TLB-CK Total Only(Creatine Kinase/CPK) (82550-CK)  Problem # 2:  GERD (ICD-530.81) Assessment: Deteriorated  see Meds  His updated medication list for this problem includes:    Prevpac Misc (Amoxicill-clarithro-lansopraz) .Marland Kitchen... As directed  Problem # 3:  DYSLIPIDEMIA (ICD-272.4) Assessment: Comment Only  His updated medication list for this problem includes:    Antara 130 Mg Caps (Fenofibrate micronized) .Marland Kitchen... Take 1 tablet by mouth once a day  Problem # 4:  ATRIAL FIBRILLATION, HX OF (ICD-V12.59) Assessment: Comment Only  Complete Medication List: 1)  Antara 130 Mg Caps (Fenofibrate micronized) .... Take 1 tablet by mouth once a day 2)  Diltiazem Hcl Er Beads 180 Mg Xr24h-cap (Diltiazem hcl er beads) .... Take 1 tablet by mouth once a day 3)  Aspirin Low Dose 81 Mg Tabs (Aspirin) .... Take 1 tablet by mouth once a day 4)  Vitamin D3 1000 Unit Tabs (Cholecalciferol) .Marland Kitchen.. 1 by mouth daily 5)  Vimovo 500-20 Mg Tbec (Naproxen-esomeprazole) .Marland Kitchen.. 1 by mouth  two times a day pc as needed pain 6)  Prevpac Misc (Amoxicill-clarithro-lansopraz) .... As directed  Patient Instructions: 1)  Please schedule a follow-up appointment in 2 weeks. 2)  Call if you are not better in a reasonable amount of time or if worse. Go to ER if feeling really bad!  Prescriptions: VIMOVO 500-20 MG TBEC (NAPROXEN-ESOMEPRAZOLE) 1 by mouth  two times a day pc as needed pain  #60 x 3   Entered and Authorized by:   Tresa Garter MD   Signed by:   Tresa Garter MD on 06/16/2009   Method used:   Print then Give to Patient   RxID:   [id number]

## 2010-03-04 ENCOUNTER — Telehealth: Payer: Self-pay | Admitting: Internal Medicine

## 2010-03-04 NOTE — Assessment & Plan Note (Signed)
Summary: 1-2 MTH SKIN BX--D/T--STC   Vital Signs:  Patient profile:   65 year old male Height:      71 inches Weight:      224 pounds BMI:     31.35 Temp:     98.5 degrees F oral Pulse rate:   68 / minute Pulse rhythm:   regular Resp:     16 per minute BP sitting:   130 / 84  (left arm) Cuff size:   regular  Vitals Entered By: Lanier Prude, CMA(AAMA) (February 02, 2010 9:48 AM)  Procedure Note Last Tetanus: Historical (03/18/2004)  Mole Biopsy/Removal: The patient complains of changing mole. Indication: suspicious lesion Consent signed: yes  Procedure # 1: shave biopsy    Size (in cm): 0.3 x 0.4    Region: lateral    Location: R flank    Comment: Risks including but not limited by incomplete procedure, bleeding, infection, recurrence were discussed with the patient. Consent form was signed. Tolerated well. Complicatons - none.      Instrument used: dermablade    Anesthesia: 0.5 ml 1% lidocaine w/epinephrine  Cleaned and prepped with: alcohol and betadine Wound dressing: neosporin and bandaid Instructions: daily dressing changes  CC: mole removal Is Patient Diabetic? No   Primary Care Provider:  Tresa Garter MD  CC:  mole removal.  History of Present Illness: Skin biopsy   Current Medications (verified): 1)  Antara 130 Mg Caps (Fenofibrate Micronized) .... Take 1 Tablet By Mouth Once A Day 2)  Diltiazem Hcl Er Beads 180 Mg Xr24h-Cap (Diltiazem Hcl Er Beads) .... Take 1 Tablet By Mouth Once A Day 3)  Aspirin Low Dose 81 Mg Tabs (Aspirin) .... Take 1 Tablet By Mouth Once A Day 4)  Vitamin D3 1000 Unit  Tabs (Cholecalciferol) .Marland Kitchen.. 1 By Mouth Daily 5)  Meclizine Hcl 12.5 Mg Tabs (Meclizine Hcl) .Marland Kitchen.. 1-2 By Mouth Qid As Needed Dizziness  Allergies (verified): No Known Drug Allergies  Physical Exam  Skin:  3-4 mm R flank mole   Impression & Recommendations:  Problem # 1:  NEOPLASM OF UNCERTAIN BEHAVIOR OF SKIN (ICD-238.2) Assessment  Deteriorated  Orders: Shave Skin Lesion < 0.5 cm/trunk/arm/leg (11300)  Complete Medication List: 1)  Antara 130 Mg Caps (Fenofibrate micronized) .... Take 1 tablet by mouth once a day 2)  Diltiazem Hcl Er Beads 180 Mg Xr24h-cap (Diltiazem hcl er beads) .... Take 1 tablet by mouth once a day 3)  Aspirin Low Dose 81 Mg Tabs (Aspirin) .... Take 1 tablet by mouth once a day 4)  Vitamin D3 1000 Unit Tabs (Cholecalciferol) .Marland Kitchen.. 1 by mouth daily 5)  Meclizine Hcl 12.5 Mg Tabs (Meclizine hcl) .Marland Kitchen.. 1-2 by mouth qid as needed dizziness   Orders Added: 1)  Shave Skin Lesion < 0.5 cm/trunk/arm/leg [11300]

## 2010-03-04 NOTE — Miscellaneous (Signed)
Summary: Mole Removal/[facility]  Mole Removal/[facility]   Imported By: Sherian Rein 02/04/2010 12:27:02  _____________________________________________________________________  External Attachment:    Type:   Image     Comment:   External Document

## 2010-03-10 NOTE — Progress Notes (Signed)
Summary: Rf Omeprazole  Phone Note From Pharmacy   Caller: [facility]  [address] [phone]* Summary of Call: rec Rf req for Omeprazole 40mg . use as needed for indigestion. This med was removed. Please advise ok to RF and siq/quantity. Initial call taken by: Lanier Prude, [facility]),  March 04, 2010 4:39 PM  Follow-up for Phone Call        ok to restart x 6 ref Follow-up by: Tresa Garter MD,  March 05, 2010 9:50 AM    New/Updated Medications: OMEPRAZOLE 40 MG CPDR (OMEPRAZOLE) use as needed for indigestion Prescriptions: OMEPRAZOLE 40 MG CPDR (OMEPRAZOLE) use as needed for indigestion  #30 x 5   Entered by:   Lanier Prude, CMA(AAMA)   Authorized by:   Tresa Garter MD   Signed by:   Lanier Prude, CMA(AAMA) on 03/05/2010   Method used:   [facility] to        [facility]  [address] [phone]* (retail)       [address]       [place], [place]  [address]       Ph: [phone]       Fax: [phone]   RxID:   [id number]

## 2010-04-14 LAB — POCT I-STAT, CHEM 8
Chloride: 105 mEq/L (ref 96–112)
Creatinine, Ser: 1.3 mg/dL (ref 0.4–1.5)
Glucose, Bld: 111 mg/dL — ABNORMAL HIGH (ref 70–99)
HCT: 46 % (ref 39.0–52.0)
Potassium: 4.5 mEq/L (ref 3.5–5.1)
Sodium: 142 mEq/L (ref 135–145)

## 2010-04-14 LAB — POCT CARDIAC MARKERS
CKMB, poc: 1 ng/mL (ref 1.0–8.0)
Myoglobin, poc: 97.9 ng/mL (ref 12–200)
Troponin i, poc: <0.05 ng/mL (ref 0.00–0.09)

## 2010-04-14 LAB — CBC
Hemoglobin: 14 g/dL (ref 13.0–17.0)
Platelets: 269 10*3/uL (ref 150–400)
RBC: 5.37 MIL/uL (ref 4.22–5.81)
WBC: 6.8 10*3/uL (ref 4.0–10.5)

## 2010-04-14 LAB — DIFFERENTIAL
Lymphocytes Relative: 25 % (ref 12–46)
Lymphs Abs: 1.7 10*3/uL (ref 0.7–4.0)
Monocytes Relative: 9 % (ref 3–12)
Neutro Abs: 4.4 10*3/uL (ref 1.7–7.7)
Neutrophils Relative %: 65 % (ref 43–77)

## 2010-06-18 NOTE — Procedures (Signed)
NAME:  Jon [name], Jon [name] NO.:  [id number]   MEDICAL RECORD NO.:  [id number]          PATIENT TYPE:  OUT   LOCATION:  SLEEP CENTER                 FACILITY:  [facility]   PHYSICIAN:  Marcelyn Bruins, M.D. [facility] DATE OF BIRTH:  [birth year]   DATE OF STUDY:  03/19/2005                              NOCTURNAL POLYSOMNOGRAM   REFERRING PHYSICIAN:  Dr. Georgina Quint. Plotnikov.   DATE OF STUDY:  March 19, 2005.   INDICATION FOR STUDY:  Hypersomnia with sleep apnea.   EPWORTH SCORE:  8.   SLEEP ARCHITECTURE:  The patient had a total sleep time of 432 minutes with  very little slow wave sleep and significantly decreased REM. Sleep onset  latency was very short at 3-1/2 minutes and REM onset was prolonged at 173  minutes. Sleep efficiency was normal at 97%.   RESPIRATORY DATA:  The patient was found to have 19 hypopneas and 516 apneas  for a respiratory disturbance index of 74 events per hour. There was very  loud snoring noted throughout and the events were not positional.   OXYGEN DATA:  The patient O2 desaturation as low as 54% with his obstructive  events especially during REM.   CARDIAC DATA:  No clinically significant cardiac arrhythmias.   MOVEMENT/PARASOMNIAS:  None.   IMPRESSION/RECOMMENDATIONS:  1.  Severe obstructive sleep apnea/hypopnea syndrome with a respiratory      disturbance index of 74 events per hour and O2 desaturation as low as      54%. Treatment for this degree of sleep apnea should focus primarily on      weight loss as well as C-PAP. The patient should be counseled regarding      sleepiness while driving or operating other heavy equipment.           ______________________________  Marcelyn Bruins, M.D. [facility]  Diplomate, American Board of Sleep  Medicine     KC/MEDQ  D:  03/28/2005 16:43:27  T:  03/29/2005 00:44:48  Job:  [id number]

## 2010-06-18 NOTE — Discharge Summary (Signed)
NAME:  Jon [name], Jon [name] NO.:  [id number]   MEDICAL RECORD NO.:  [id number]          PATIENT TYPE:  INP   LOCATION:  6526                         FACILITY:  [facility]   PHYSICIAN:  Doylene Canning. Ladona Ridgel, M.D.  DATE OF BIRTH:  July 07, 1945   DATE OF ADMISSION:  02/16/2005  DATE OF DISCHARGE:  02/18/2005                                 DISCHARGE SUMMARY   He has no known drug allergies.   PRINCIPAL DIAGNOSES:  1.  Admitted with atrial fibrillation, rapid ventricular rate.  2.  One prior episode of atrial fibrillation greater than one year ago.  He      was hospitalized.  3.  Converted to sinus rhythm on flecainide 300 mg x1 dose.  4.  No thromboembolic risk factors.  5.  Possible obstructive sleep apnea.  Primary care giver possible referral      for sleep study.   SECONDARY DIAGNOSES:  1.  Ongoing tobacco habituation.  2.  Hypertriglyceridemia.   PROCEDURES:  None.   BRIEF HISTORY:  Mr. Glassco is a 65 year old male.  He has a history of  one documented episode of atrial fibrillation.  He was hospitalized at that  time.  He has had no significant symptoms for over one year.  On the day  prior to admission he was in his usual state of health.  He presented  January 17 secondary to palpitation.  Electrocardiogram in the emergency  room found atrial fibrillation, rapid ventricular rate 130-140 beats per  minute.  IV Cardizem slowed the rate but after several hours he was admitted  for additional evaluation.  Patient denies chest pain, shortness of breath,  or syncope.  He has no thromboembolic risk factors, specifically no  diabetes, no hypertension, no family history of stroke, no history of  hypothyroidism.  Patient is denying fevers, chills, other recent symptoms.  Patient has been in atrial fibrillation the last several hours with no  evidence of heart disease in his medical history.  Plan is to give the  patient a single dose of flecainide 300 mg for possible acute  termination of  his dysrhythmia.  Patient can tell when he is in atrial fibrillation.  He is  sensitive to the palpitation.   HOSPITAL COURSE:  Patient admitted through the emergency room January 17.  He was found to be in atrial fibrillation, rapid ventricular rate on IV  Cardizem.  Cardizem treatment slowed the rate, but did not stop the atrial  fibrillation or slow it dramatically.  Patient was seen in consultation by  Dr. Lewayne Bunting, electrophysiology cardiologist and recommended flecainide  300 mg one-time dose.  Cardiac markers were also checked.  They were in  serial fashion less than 0.05, less than 0.05, then 0.02.  Patient did  convert to sinus rhythm after giving flecainide and back-up medication was  continued as oral Cardizem 180 mg daily.  Patient discharging January 19  maintaining sinus rhythm on oral Cardizem.  We will also maintain on oral  aspirin.  Patient has a history of obesity and pronounced snoring.  It is  possible that he has obstructive sleep  apnea and this will possibly be  assessed as an outpatient.   LABORATORIES:  The BNP on admission was less than 30.  This complete blood  count on admission white cells 8.2, hemoglobin 15.8, hematocrit 45.5, and  platelets 244.  Comprehensive metabolic panel:  Sodium 138, potassium 3.8,  chloride 105, carbonate 26, glucose 164, BUN 13, creatinine 1.3.  PT 12.8,  INR 0.9, lipid profile on January 19:  Cholesterol 165, triglycerides 260.  Patient is on __________ .  HDL cholesterol is 30, LDL cholesterol 83.  Comprehensive panel was taken almost at midnight with glucose of 164  __________  question that this patient has glucose intolerance.  This is  also a possible follow-up opportunity as well.  Patient discharges on the  following medicines.   DISCHARGE MEDICATIONS:  1.  Diltiazem 180 mg daily.  This is a new medication.  2.  Enteric-coated aspirin 325 mg daily.  3.  Continue __________  as before this  admission.   He will follow up with Dr. Posey Rea February 28, 2005 at 9:30.  Once again,  assessment possibly for hyperglycemia and also for obstructive sleep apnea.  He will see Dr. Tenny Craw March 11, 2005 at 4:15.  He is, of course, to call  the office of [facility] or report to the hospital if he has  recurrence of his tachy arrhythmia.      Maple Mirza, P.A.    ______________________________  Doylene Canning. Ladona Ridgel, M.D.    GM/MEDQ  D:  02/18/2005  T:  02/18/2005  Job:  [id number]   cc:   Pricilla Riffle, M.D.  [address]  [address]  [place]  [place] [address]   Georgina Quint. Plotnikov, M.D. [facility]  [address]  [place]  [place] [address]

## 2010-06-18 NOTE — Op Note (Signed)
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   PROCEDURE:  Repair of flexor digitorum profundus tendon and radial digital  nerve right middle finger.   SURGEON:  Lowell Bouton, M.D.   ANESTHESIA:  General.   OPERATIVE FINDINGS:  The patient had a complete transection of the FDP  tendon. The tendon had retracted back to beneath the A2 pulley. The level of  repair was at A5. The radial digital nerve was transected.   PROCEDURE:  Under general anesthesia with a tourniquet on the right arm, the  right hand was prepped and draped in the usual fashion. After elevating the  limb, the tourniquet was inflated to 250 mmHg. The previous sutures were  removed and the laceration was extended proximally and distally in a zigzag  fashion. Blunt dissection was carried through the subcutaneous tissues and 4-  0 silk retention sutures were inserted for retraction. Blunt dissection was  carried down to the flexor sheath and the distal stump of the tendon was  identified. There was approximately 2 cm of the tendon distally. An attempt  was then made to milk the proximal end of the tendon distally in the sheath  while a fine hemostat was inserted beneath the pulley to retrieve the  tendon. This was unsuccessful and so an incision was extended proximally.  The tendon was identified beneath the A2 pulley through a transverse  incision in the flexor sheath at A3. The  tendon was then brought out through  A3 and a 3-0 Ethibond suture in a Kessler fashion was applied. The tendon  was then threaded out beneath the A4 to the level of A5 using a fine  hemostat and the suture. The tendon was easily brought out to the area to be  repaired and a 25 gauge needle was inserted to hold the tendon in place. The  tendon repair was then performed using the 3-0 Ethibond in a Kessler  fashion. It was reinforced with simple 3-0 Ethibond sutures as it was distal  to the A4 pulley. The microscope was then brought in and the radial digital  nerve was dissected out bluntly. An epineurial repair was performed with 9-0  nylon suture using microscope magnification. Three sutures were inserted in  the nerve. The microscope was then removed from the field. The wound was  irrigated copiously with saline and the skin was closed with 4-0 nylon  suture. Sterile dressings were applied followed by a dorsal splint with the  wrist and fingers flexed. 0.5% Marcaine was inserted and the digit for pain  control. The patient went  to the recovery room awake and stable in good  condition.     EMM/MEDQ  D:  03/15/2004  T:  03/15/2004  Job:  [id number]

## 2010-06-18 NOTE — Assessment & Plan Note (Signed)
[facility]                             PRIMARY CARE OFFICE NOTE   Jon [name], Jon [name]                   MRN:          [id number]  DATE:10/25/2005                            DOB:          1945/09/18    The patient is a 65 year old male who presents for a wellness examination.   ALLERGIES:  None.  Past medical history, family history, social history as per October 27, 2004, note.   Current medicines are reviewed with the patient.   REVIEW OF SYSTEMS:  Negative for chest pain or shortness of breath.  No  syncope.  No neurologic complaints.  No GI symptoms.  The rest is negative.   PHYSICAL EXAMINATION:  VITAL SIGNS:  Blood pressure 118/78, pulse 52, temp  97.6, weight 214 pounds (was 254).  GENERAL:  He looks well.  He is in no acute distress.  HEENT:  Moist mucosa.  NECK:  Supple, no thyromegaly or bruit.  LUNGS:  Clear.  No wheezes or rales.  HEART:  S1, S2.  No murmur, no gallop.  ABDOMEN:  Soft, nontender, no organomegaly, no masses felt.  LOWER EXTREMITIES:  Without edema.  NEUROLOGIC:  He is awake, alert, and oriented.  Denies being depressed.  RECTAL:  Normal prostate, stool blood negative.  No masses, no nodules.  SKIN:  Clear.   LABORATORY DATA:  October 18, 2005, CBC normal with glucose of 101.  CMET  normal.  Cholesterol 122, LDL 70.  TSH normal.  Urinalysis normal.  PSA  0.073.   ASSESSMENT/PLAN:  1. Normal wellness examination.  Age/health related issues discussed.      Healthy lifestyle discussed.  Zostavax information discussed.  Last      tetanus shot in 2006, refused a flu shot.  Repeat exam in 12 months.  I      complimented him on his excellent weight loss based on portion      reduction.  2. Colon polyps.  He needs to reschedule colonoscopy with Dr. Marina Goodell.  We      will start paperwork.  3. Elevated glucose.  He lost a lot of weight.  The problem is corrected.      Follow up with me in 12 months.        ______________________________  Georgina Quint. Plotnikov, MD      AVP/MedQ  DD:  11/03/2005  DT:  11/04/2005  Job #:  [id number]   cc:   Wilhemina Bonito. Eda Keys., MD

## 2010-06-18 NOTE — H&P (Signed)
NAME:  KOLTIN, WEHMEYER NO.:  [id number]   MEDICAL RECORD NO.:  [id number]          PATIENT TYPE:  INP   LOCATION:  6526                         FACILITY:  [facility]   PHYSICIAN:  Doylene Canning. Ladona Ridgel, M.D.  DATE OF BIRTH:  02/01/1945   DATE OF ADMISSION:  02/16/2005  DATE OF DISCHARGE:                                HISTORY & PHYSICAL   ADMITTING DIAGNOSES:  Atrial fibrillation with a rapid ventricular response.   CHIEF COMPLAINT:  Palpitations.   HISTORY OF PRESENT ILLNESS:  This patient is a 65 year old male with a  history of one documented episode of atrial fibrillation in the past.  He  has had no significant symptoms for over one year.  He was in his usual  state of health until the day prior to admission when he presented to the  hospital secondary to palpitations.  He was subsequently found to be in  atrial fibrillation with a rapid ventricular response at about 130 to 140  beats per minute.  He was treated with intravenous Cardizem with some  slowing of his ventricular response, but after several hours of this was  admitted for additional evaluation.  The patient denies chest pain or  shortness of breath. He denies syncope. He denies any other thromboembolic  risk factors, specifically no diabetes, no hypertension, no family history  of stroke.  No history of hyperthyroidism.  The patient denies fevers or  chills or any other recent symptoms.   PAST MEDICAL HISTORY:  Notable for his paroxysmal atrial fibrillation which  was initially diagnosed in 2005.  He has had one other hospitalization for  this.   SOCIAL HISTORY:  The patient smokes 1/2 pack of cigarettes a day and has  done so for most of his adult life.  He denies alcohol abuse.  He denies  recreational drug use.   FAMILY HISTORY:  Noncontributory.   REVIEW OF SYSTEMS:  Negative for vision or hearing problems.  Denies nausea,  vomiting, diarrhea, constipation, polyuria, polydipsia, heat or cold  intolerance, recent skin problems, arthritic complaints, hemoptysis,  claudication, night sweats, any neurological problems.  He denies any easy  bruisability or other hematologic problems.   PHYSICAL EXAMINATION:  He is a pleasant, well appearing 65 year old man in  on distress.  VITAL SIGNS:  Blood pressure was 101/70, pulse 100 and irregularly  irregular. Respirations were 18.  Temperature is 98.  HEENT:  Normocephalic, atraumatic.  Pupils equal and round.  Oropharynx is  moist.  Sclerae anicteric.  No jugular venous distention.  There is no  thyromegaly.  Trachea was midline.  Carotids are 2+ and symmetric.  LUNGS:  Clear bilaterally to auscultation.  There is no wheezes, rales or  rhonchi present.  There is no increased work of breathing.  CARDIOVASCULAR:  Irregular regular rhythm with normal S1 and S2.  The PMI  was not laterally displaced, nor was it enlarged.  There were no murmurs,  rubs or gallops present.  ABDOMEN:  Soft, nontender, nondistended.  There is no organomegaly.  Bowel  sounds are present.  EXTREMITIES:  Demonstrate no cyanosis, clubbing or  edema.  Pulses were 2+  and symmetric.  NEUROLOGICAL:  Alert and oriented x 3 with cranial nerves intact.  Strength  was 5/5 and symmetric.   The EKG demonstrates atrial fibrillation with a controlled ventricular  response between 90 and 100 beats per minute on intravenous Cardizem.   IMPRESSION:  1.  Paroxysmal atrial fibrillation.  2.  Tobacco abuse.   DISCUSSION:  The patient has maintained atrial fibrillation for the last  several hours.  He has no any evidence of any heart disease.  Will add a  single dose of flecainide to see if we can acutely terminate him.  The  patient clearly feels when he goes into fibrillation and has only been in  fibrillation for less than 12 hours.  We have given one shot of IV Lovenox.           ______________________________  Doylene Canning. Ladona Ridgel, M.D.     GWT/MEDQ  D:  02/17/2005  T:   02/17/2005  Job:  [id number]   cc:   Pricilla Riffle, M.D.  [address]  [address]  [place]  [place] [address]   Georgina Quint. Plotnikov, M.D. [facility]  [address]  [place]  [place] [address]

## 2010-06-18 NOTE — Assessment & Plan Note (Signed)
[facility]                            CARDIOLOGY OFFICE NOTE   TYNAN, BOESEL                   MRN:          [id number]  DATE:01/02/2006                            DOB:          02/05/1945    IDENTIFICATION:  [name] is a 66 year old gentleman who I saw  back in February with history of palpitations, dyslipidemia by report,  atrial fibrillation.   Since I have seen him, he has done well.  He really denies any  significant palpitations.  He is active and notes no change in the  ability to do things, no chest pain, no shortness of breath.   CURRENT MEDICATIONS:  1. Antara 130 mg daily.  2. Diltiazem 180 mg daily.  3. Aspirin 325 mg daily.   PHYSICAL EXAM:  On exam, the patient is in no distress.  Blood pressure is 133/79.  Pulse is 55 and regular.  Weight is 218, down  from 254.  LUNGS:  Clear.  CARDIAC:  Regular rate and rhythm, S1 and S2.  No S3.  No murmurs.  ABDOMEN:  Benign.  EXTREMITIES:  No edema.   TWELVE-LEAD EKG:  Normal sinus bradycardia, 50 beats per minute.   LABORATORY DATA:  Total cholesterol of 122, triglycerides of 56, HDL of  41, LDL of 70.   IMPRESSION:  1. Mr. Tinnell is a 65 year old gentleman with a history of atrial      fibrillation.  He has a normal echocardiogram.  Left atrium is      minimally dilated on my evaluation.   He has been relatively asymptomatic. I would continue to follow as  needed and would not set a definite followup unless he starts having  more problems.   1. Dyslipidemia, on  Antara, excellent control now and I think the      biggest thing was his weight loss and I have congratulated him on      this.  I would continue this for now and leave this up to Dr. Sonda Primes.   I will not set a definite followup.  Again, his heart rate is a little  on the low side today, but he is asymptomatic.  If it becomes a problem,  I would consider cutting back on the diltiazem,  but would not for now.   Again, followup as needed.     Pricilla Riffle, MD, [facility]  Electronically Signed    PVR/MedQ  DD: 01/02/2006  DT: 01/03/2006  Job #: [id number]   cc:   Georgina Quint. Plotnikov, MD

## 2010-06-24 ENCOUNTER — Other Ambulatory Visit: Payer: Self-pay

## 2010-07-01 ENCOUNTER — Ambulatory Visit: Payer: Self-pay | Admitting: Internal Medicine

## 2010-07-02 ENCOUNTER — Other Ambulatory Visit: Payer: Self-pay | Admitting: Internal Medicine

## 2010-07-02 ENCOUNTER — Other Ambulatory Visit (INDEPENDENT_AMBULATORY_CARE_PROVIDER_SITE_OTHER): Payer: Medicare Other

## 2010-07-02 DIAGNOSIS — E119 Type 2 diabetes mellitus without complications: Secondary | ICD-10-CM

## 2010-07-02 LAB — BASIC METABOLIC PANEL
Chloride: 103 mEq/L (ref 96–112)
Creatinine, Ser: 1.3 mg/dL (ref 0.4–1.5)
GFR: 72.52 mL/min (ref >60.00)
Potassium: 4.3 mEq/L (ref 3.5–5.1)

## 2010-07-02 LAB — HEMOGLOBIN A1C: Hgb A1c MFr Bld: 6.9 % — ABNORMAL HIGH (ref 4.6–6.5)

## 2010-07-09 ENCOUNTER — Encounter: Payer: Self-pay | Admitting: Internal Medicine

## 2010-07-09 ENCOUNTER — Ambulatory Visit (INDEPENDENT_AMBULATORY_CARE_PROVIDER_SITE_OTHER): Payer: Medicare Other | Admitting: Internal Medicine

## 2010-07-09 DIAGNOSIS — R7309 Other abnormal glucose: Secondary | ICD-10-CM

## 2010-07-09 DIAGNOSIS — R1310 Dysphagia, unspecified: Secondary | ICD-10-CM

## 2010-07-09 DIAGNOSIS — Z8679 Personal history of other diseases of the circulatory system: Secondary | ICD-10-CM

## 2010-07-09 DIAGNOSIS — I10 Essential (primary) hypertension: Secondary | ICD-10-CM

## 2010-07-09 MED ORDER — DILTIAZEM HCL ER 240 MG PO CP24: 240.0000 mg | ORAL_CAPSULE | Freq: Every day | ORAL | Status: DC

## 2010-07-09 NOTE — Progress Notes (Signed)
  Subjective:    Patient ID: Jon [name], male    DOB: 11-16-1945, 65 y.o.   MRN: [id number]  HPI  The patient presents for a follow-up of  chronic hypertension, chronic dyslipidemia, type 2 pre-diabetes controlled with medicines   Review of Systems  Constitutional: Negative for appetite change, fatigue and unexpected weight change.  HENT: Negative for nosebleeds, congestion, sore throat, sneezing, trouble swallowing and neck pain.   Eyes: Negative for itching and visual disturbance.  Respiratory: Negative for cough.   Cardiovascular: Negative for chest pain, palpitations and leg swelling.  Gastrointestinal: Negative for nausea, diarrhea, blood in stool and abdominal distention.       Food gets stuck at times  Genitourinary: Negative for frequency and hematuria.  Musculoskeletal: Negative for back pain, joint swelling and gait problem.  Skin: Negative for rash.  Neurological: Negative for dizziness, tremors, speech difficulty and weakness.  Psychiatric/Behavioral: Negative for sleep disturbance, dysphoric mood and agitation. The patient is not nervous/anxious.        Objective:   Physical Exam  Constitutional: He is oriented to person, place, and time. He appears well-developed.  HENT:  Mouth/Throat: Oropharynx is clear and moist.  Eyes: Conjunctivae are normal. Pupils are equal, round, and reactive to light.  Neck: Normal range of motion. No JVD present. No thyromegaly present.  Cardiovascular: Normal rate, regular rhythm, normal heart sounds and intact distal pulses.  Exam reveals no gallop and no friction rub.   No murmur heard. Pulmonary/Chest: Effort normal and breath sounds normal. No respiratory distress. He has no wheezes. He has no rales. He exhibits no tenderness.  Abdominal: Soft. Bowel sounds are normal. He exhibits no distension and no mass. There is no tenderness. There is no rebound and no guarding.  Musculoskeletal: Normal range of motion. He exhibits no edema  and no tenderness.  Lymphadenopathy:    He has no cervical adenopathy.  Neurological: He is alert and oriented to person, place, and time. He has normal reflexes. No cranial nerve deficit. He exhibits normal muscle tone. Coordination normal.  Skin: Skin is warm and dry. No rash noted.  Psychiatric: He has a normal mood and affect. His behavior is normal. Judgment and thought content normal.   Lab Results  Component Value Date   WBC 5.5 12/22/2009   HGB 12.6* 12/22/2009   HCT 38.1* 12/22/2009   PLT 255.0 12/22/2009   CHOL 125 12/22/2009   TRIG 98.0 12/22/2009   HDL 42.50 12/22/2009   ALT 20 12/22/2009   AST 28 12/22/2009   NA 138 07/02/2010   K 4.3 07/02/2010   CL 103 07/02/2010   CREATININE 1.3 07/02/2010   BUN 20 07/02/2010   CO2 27 07/02/2010   TSH 0.72 12/22/2009   PSA 0.91 12/22/2009   HGBA1C 6.9* 07/02/2010            Assessment & Plan:

## 2010-07-09 NOTE — Assessment & Plan Note (Signed)
Watching CBGs 

## 2010-07-09 NOTE — Assessment & Plan Note (Signed)
Increase Dilacor

## 2010-07-09 NOTE — Assessment & Plan Note (Signed)
GI cons Dr Marina Goodell

## 2010-07-09 NOTE — Assessment & Plan Note (Signed)
On Rx 

## 2010-08-17 ENCOUNTER — Encounter: Payer: Self-pay | Admitting: *Deleted

## 2010-08-24 ENCOUNTER — Encounter: Payer: Self-pay | Admitting: Internal Medicine

## 2010-08-24 ENCOUNTER — Ambulatory Visit (INDEPENDENT_AMBULATORY_CARE_PROVIDER_SITE_OTHER): Payer: Medicare Other | Admitting: Internal Medicine

## 2010-08-24 ENCOUNTER — Other Ambulatory Visit: Payer: Self-pay | Admitting: Internal Medicine

## 2010-08-24 VITALS — BP 128/72 | HR 88 | Ht 71.0 in | Wt 218.0 lb

## 2010-08-24 DIAGNOSIS — Z86010 Personal history of colonic polyps: Secondary | ICD-10-CM

## 2010-08-24 DIAGNOSIS — K219 Gastro-esophageal reflux disease without esophagitis: Secondary | ICD-10-CM

## 2010-08-24 DIAGNOSIS — R1310 Dysphagia, unspecified: Secondary | ICD-10-CM

## 2010-08-24 NOTE — Patient Instructions (Signed)
EGD Dil. Scheduled at  [facility] 08/25/10 2:00 pm arrive at 1:00 pm on 4th floor EGD and Dilation brochures given for you to read.

## 2010-08-24 NOTE — Progress Notes (Signed)
HISTORY OF PRESENT ILLNESS:  Jon [name] is a 65 y.o. male with hypertension, dyslipidemia, and GERD. He has been followed in this office previously for a history of adenomatous polyps of the colon in November 2004 and again in January 2010. He is now sent regarding problems with intermittent solid food dysphagia of several years duration. However, the problem has been more frequent in the last 8 months. Currently experiencing several episodes per week. He has been on that resolved 40 mg daily for about 8 months. He denies nausea, vomiting, abdominal pain, or weight loss. His overall health has been stable. Review of laboratories from June 2012 reveal normal comprehensive metabolic panel except for elevated glucose. Hemoglobin A1c 6.9%. Hemoglobin 12.6. Normal LFTs and TSH.  REVIEW OF SYSTEMS:  All non-GI ROS negative.  Past Medical History  Diagnosis Date  . Atrial fibrillation   . Colon polyp   . Other and unspecified hyperlipidemia   . Unspecified sleep apnea   . Glaucoma   . Cataract   . GERD (gastroesophageal reflux disease)   . H. pylori infection 2011  . Diverticulosis     Past Surgical History  Procedure Date  . Tonsillectomy   . Elbow bursa surgery     Social History Jon [name]  reports that he has quit smoking. He has never used smokeless tobacco. He reports that he drinks about 3.6 ounces of alcohol per week. He reports that he does not use illicit drugs.  family history includes Arthritis in his mother and Diabetes in his brother and father.  There is no history of Coronary artery disease and Colon cancer.  No Known Allergies     PHYSICAL EXAMINATION: Vital signs: BP 128/72  Pulse 88  Ht 5\' 11"  (1.803 m)  Wt 218 lb (98.884 kg)  BMI 30.40 kg/m2  Constitutional: generally well-appearing, no acute distress Psychiatric: alert and oriented x3, cooperative Eyes: extraocular movements intact, anicteric, conjunctiva pink Mouth: oral pharynx moist, no  lesions Neck: supple no lymphadenopathy Cardiovascular: heart regular rate and rhythm, no murmur Lungs: clear to auscultation bilaterally Abdomen: soft, nontender, nondistended, no obvious ascites, no peritoneal signs, normal bowel sounds, no organomegaly Extremities: no lower extremity edema bilaterally Skin: no lesions on visible extremities Neuro: No focal deficits. No asterixis.     ASSESSMENT:  #1. Intermittent solid food dysphagia. Progressive. Rule out peptic stricture. Rule out carcinoma #2. GERD. On omeprazole #3. History of adenomatous colon polyps. Surveillance up to date.   PLAN:  #1. Upper endoscopy with probable esophageal dilation.The nature of the procedure, as well as the risks, benefits, and alternatives were carefully and thoroughly reviewed with the patient. Ample time for discussion and questions allowed. The patient understood, was satisfied, and agreed to proceed.  #2. Continue omeprazole #3. Reflux precautions #4. Surveillance colonoscopy due around January 2015

## 2010-08-25 ENCOUNTER — Other Ambulatory Visit (INDEPENDENT_AMBULATORY_CARE_PROVIDER_SITE_OTHER): Payer: Medicare Other

## 2010-08-25 ENCOUNTER — Encounter: Payer: Self-pay | Admitting: Internal Medicine

## 2010-08-25 ENCOUNTER — Other Ambulatory Visit: Payer: Self-pay | Admitting: Internal Medicine

## 2010-08-25 ENCOUNTER — Ambulatory Visit (AMBULATORY_SURGERY_CENTER): Payer: Medicare Other | Admitting: Internal Medicine

## 2010-08-25 ENCOUNTER — Telehealth: Payer: Self-pay

## 2010-08-25 DIAGNOSIS — C160 Malignant neoplasm of cardia: Secondary | ICD-10-CM

## 2010-08-25 DIAGNOSIS — R1310 Dysphagia, unspecified: Secondary | ICD-10-CM

## 2010-08-25 DIAGNOSIS — K228 Other specified diseases of esophagus: Secondary | ICD-10-CM

## 2010-08-25 DIAGNOSIS — K2289 Other specified disease of esophagus: Secondary | ICD-10-CM

## 2010-08-25 DIAGNOSIS — R7309 Other abnormal glucose: Secondary | ICD-10-CM

## 2010-08-25 DIAGNOSIS — Z8679 Personal history of other diseases of the circulatory system: Secondary | ICD-10-CM

## 2010-08-25 DIAGNOSIS — K219 Gastro-esophageal reflux disease without esophagitis: Secondary | ICD-10-CM

## 2010-08-25 DIAGNOSIS — C169 Malignant neoplasm of stomach, unspecified: Secondary | ICD-10-CM

## 2010-08-25 LAB — COMPREHENSIVE METABOLIC PANEL
ALT: 22 U/L (ref 0–53)
CO2: 28 mEq/L (ref 19–32)
Calcium: 8.9 mg/dL (ref 8.4–10.5)
Chloride: 107 mEq/L (ref 96–112)
GFR: 78.85 mL/min (ref >60.00)
Sodium: 141 mEq/L (ref 135–145)
Total Bilirubin: 0.5 mg/dL (ref 0.3–1.2)
Total Protein: 7.1 g/dL (ref 6.0–8.3)

## 2010-08-25 MED ORDER — SODIUM CHLORIDE 0.9 % IV SOLN: 500.0000 mL | INTRAVENOUS | Status: DC

## 2010-08-25 NOTE — Telephone Encounter (Signed)
Pt scheduled for CT of CAP with contrast for 08/27/10@2 :30pm @[facility] . Pt to arrive at 2:15pm. Clydie Braun in recovery in the [facility] to provide pt with contrast and instructions. Pt to go by the lab prior to leaving today for BMET. Orders entered.

## 2010-08-25 NOTE — Progress Notes (Signed)
  CAT Scan arranged for July 27th at 230. Contrast given to carepartner. Instructions for scan given with carepartners verbalizing understanding.

## 2010-08-26 ENCOUNTER — Telehealth: Payer: Self-pay | Admitting: *Deleted

## 2010-08-26 NOTE — Telephone Encounter (Signed)

## 2010-08-27 ENCOUNTER — Ambulatory Visit (HOSPITAL_COMMUNITY)
Admission: RE | Admit: 2010-08-27 | Discharge: 2010-08-27 | Disposition: A | Discharge status: Home / Self Care | Payer: Medicare Other | Source: Ambulatory Visit | Attending: Internal Medicine | Admitting: Internal Medicine

## 2010-08-27 DIAGNOSIS — I2510 Atherosclerotic heart disease of native coronary artery without angina pectoris: Secondary | ICD-10-CM | POA: Insufficient documentation

## 2010-08-27 DIAGNOSIS — K228 Other specified diseases of esophagus: Secondary | ICD-10-CM

## 2010-08-27 DIAGNOSIS — K2289 Other specified disease of esophagus: Secondary | ICD-10-CM

## 2010-08-27 DIAGNOSIS — K229 Disease of esophagus, unspecified: Secondary | ICD-10-CM | POA: Insufficient documentation

## 2010-08-27 MED ORDER — IOHEXOL 300 MG/ML  SOLN
100.0000 mL | Freq: Once | INTRAMUSCULAR | Status: AC | PRN
  Administered 2010-08-27: 100 mL via INTRAVENOUS

## 2010-08-31 ENCOUNTER — Telehealth: Payer: Self-pay

## 2010-08-31 NOTE — Telephone Encounter (Signed)
Referral made to Dr. Truett Perna, record faxed to [phone].

## 2010-09-08 ENCOUNTER — Ambulatory Visit
Admission: RE | Admit: 2010-09-08 | Discharge: 2010-09-08 | Disposition: A | Discharge status: Home / Self Care | Payer: Medicare Other | Source: Ambulatory Visit | Attending: Radiation Oncology | Admitting: Radiation Oncology

## 2010-09-08 ENCOUNTER — Encounter (HOSPITAL_BASED_OUTPATIENT_CLINIC_OR_DEPARTMENT_OTHER): Payer: Medicare Other | Admitting: Oncology

## 2010-09-08 ENCOUNTER — Other Ambulatory Visit: Payer: Self-pay | Admitting: Oncology

## 2010-09-08 DIAGNOSIS — K219 Gastro-esophageal reflux disease without esophagitis: Secondary | ICD-10-CM | POA: Insufficient documentation

## 2010-09-08 DIAGNOSIS — Z510 Encounter for antineoplastic radiation therapy: Secondary | ICD-10-CM | POA: Insufficient documentation

## 2010-09-08 DIAGNOSIS — C160 Malignant neoplasm of cardia: Secondary | ICD-10-CM | POA: Insufficient documentation

## 2010-09-08 DIAGNOSIS — Z7982 Long term (current) use of aspirin: Secondary | ICD-10-CM | POA: Insufficient documentation

## 2010-09-08 DIAGNOSIS — G4733 Obstructive sleep apnea (adult) (pediatric): Secondary | ICD-10-CM

## 2010-09-08 DIAGNOSIS — E785 Hyperlipidemia, unspecified: Secondary | ICD-10-CM

## 2010-09-08 DIAGNOSIS — Z87891 Personal history of nicotine dependence: Secondary | ICD-10-CM | POA: Insufficient documentation

## 2010-09-08 DIAGNOSIS — E119 Type 2 diabetes mellitus without complications: Secondary | ICD-10-CM

## 2010-09-08 DIAGNOSIS — H409 Unspecified glaucoma: Secondary | ICD-10-CM | POA: Insufficient documentation

## 2010-09-08 DIAGNOSIS — Z79899 Other long term (current) drug therapy: Secondary | ICD-10-CM | POA: Insufficient documentation

## 2010-09-08 DIAGNOSIS — C159 Malignant neoplasm of esophagus, unspecified: Secondary | ICD-10-CM

## 2010-09-08 DIAGNOSIS — G4730 Sleep apnea, unspecified: Secondary | ICD-10-CM | POA: Insufficient documentation

## 2010-09-14 ENCOUNTER — Other Ambulatory Visit: Payer: Self-pay | Admitting: Oncology

## 2010-09-14 ENCOUNTER — Encounter (HOSPITAL_BASED_OUTPATIENT_CLINIC_OR_DEPARTMENT_OTHER): Payer: Medicare Other | Admitting: Oncology

## 2010-09-14 DIAGNOSIS — C159 Malignant neoplasm of esophagus, unspecified: Secondary | ICD-10-CM

## 2010-09-14 LAB — COMPREHENSIVE METABOLIC PANEL
AST: 19 U/L (ref 0–37)
Alkaline Phosphatase: 46 U/L (ref 39–117)
BUN: 20 mg/dL (ref 6–23)
Calcium: 9.8 mg/dL (ref 8.4–10.5)
Creatinine, Ser: 1.25 mg/dL (ref 0.50–1.35)
Total Bilirubin: 0.4 mg/dL (ref 0.3–1.2)

## 2010-09-14 LAB — CBC WITH DIFFERENTIAL/PLATELET
Basophils Absolute: 0 10*3/uL (ref 0.0–0.1)
EOS%: 3.4 % (ref 0.0–7.0)
HCT: 32.8 % — ABNORMAL LOW (ref 38.4–49.9)
HGB: 10.9 g/dL — ABNORMAL LOW (ref 13.0–17.1)
LYMPH%: 29.1 % (ref 14.0–49.0)
MCH: 23.2 pg — ABNORMAL LOW (ref 27.2–33.4)
MCV: 70.1 fL — ABNORMAL LOW (ref 79.3–98.0)
MONO%: 9.2 % (ref 0.0–14.0)
NEUT%: 57.8 % (ref 39.0–75.0)

## 2010-09-17 ENCOUNTER — Encounter (HOSPITAL_COMMUNITY)
Admission: RE | Admit: 2010-09-17 | Discharge: 2010-09-17 | Disposition: A | Discharge status: Home / Self Care | Payer: Medicare Other | Source: Ambulatory Visit | Attending: Oncology | Admitting: Oncology

## 2010-09-17 ENCOUNTER — Encounter: Payer: Self-pay | Admitting: *Deleted

## 2010-09-17 DIAGNOSIS — C159 Malignant neoplasm of esophagus, unspecified: Secondary | ICD-10-CM

## 2010-09-17 DIAGNOSIS — C169 Malignant neoplasm of stomach, unspecified: Secondary | ICD-10-CM | POA: Insufficient documentation

## 2010-09-21 ENCOUNTER — Encounter (HOSPITAL_COMMUNITY)
Admission: RE | Admit: 2010-09-21 | Discharge: 2010-09-21 | Disposition: A | Discharge status: Home / Self Care | Payer: Medicare Other | Source: Ambulatory Visit | Attending: Oncology | Admitting: Oncology

## 2010-09-21 DIAGNOSIS — C169 Malignant neoplasm of stomach, unspecified: Secondary | ICD-10-CM | POA: Insufficient documentation

## 2010-09-21 MED ORDER — FLUDEOXYGLUCOSE F - 18 (FDG) INJECTION
17.1000 | Freq: Once | INTRAVENOUS | Status: AC | PRN
  Administered 2010-09-21: 17.1 via INTRAVENOUS

## 2010-09-22 ENCOUNTER — Encounter (HOSPITAL_BASED_OUTPATIENT_CLINIC_OR_DEPARTMENT_OTHER): Payer: Medicare Other | Admitting: Oncology

## 2010-09-22 DIAGNOSIS — C160 Malignant neoplasm of cardia: Secondary | ICD-10-CM

## 2010-09-22 DIAGNOSIS — Z5111 Encounter for antineoplastic chemotherapy: Secondary | ICD-10-CM

## 2010-09-24 ENCOUNTER — Encounter (INDEPENDENT_AMBULATORY_CARE_PROVIDER_SITE_OTHER): Payer: Self-pay | Admitting: General Surgery

## 2010-09-24 ENCOUNTER — Ambulatory Visit (INDEPENDENT_AMBULATORY_CARE_PROVIDER_SITE_OTHER): Payer: Medicare Other | Admitting: General Surgery

## 2010-09-24 VITALS — BP 112/72 | HR 56 | Temp 97.2°F | Ht 71.0 in | Wt 213.1 lb

## 2010-09-24 DIAGNOSIS — C169 Malignant neoplasm of stomach, unspecified: Secondary | ICD-10-CM | POA: Insufficient documentation

## 2010-09-24 LAB — GLUCOSE, CAPILLARY: Glucose-Capillary: 110 mg/dL — ABNORMAL HIGH (ref 70–99)

## 2010-09-24 NOTE — Assessment & Plan Note (Signed)
Getting neoadjuvant chemoradiation. Plan rescan after treatment. Reviewed esophagogastrectomy with patient; risks, benefits, surgical procedure, post op course, and need for dx laparoscopy.   Refer to Dr. Edwyna Shell for combined treatment. Follow up in 5 weeks.

## 2010-09-24 NOTE — Progress Notes (Signed)
Chief Complaint  Patient presents with  . Other    new pt- eval of gastric cancer     HISTORY: Patient presents with dysphagia to solids.  He felt as if his food was sticking in his esophagus.  He had mild anemia.  His primary MD referred him to GI for endoscopy.  Dr. Marina Goodell found a large ulcerated mass at the GE jxn and in the gastric cardia.   This was biopsy proven adenocarcinoma.  He has seen Dr. Truett Perna and Mitzi Hansen and was discussed at GI tumor board.  He is receiving neoadjuvant chemoradiation.  He has no evidence of metastatic disease, but does have questionable lymphatic involvement.  Since finding out about the mass, he has been more careful about his food selection and has not had any more dysphagia.  He has not lost weight.  He has not had nausea or vomiting.    Past Medical History  Diagnosis Date  . Atrial fibrillation   . Colon polyp   . Other and unspecified hyperlipidemia   . Unspecified sleep apnea   . Glaucoma   . Cataract   . GERD (gastroesophageal reflux disease)   . H. pylori infection 2011  . Diverticulosis   . Cancer     Gastroesophageal   . Sleep apnea      Current Outpatient Prescriptions  Medication Sig Dispense Refill  . aspirin 81 MG EC tablet Take 81 mg by mouth daily.        Marland Kitchen BROMDAY 0.09 % (DAILY) SOLN       . Cholecalciferol 1000 UNITS tablet Take 1,000 Units by mouth daily.        Marland Kitchen diltiazem (CARDIZEM CD) 240 MG 24 hr capsule       . diltiazem (DILACOR XR) 240 MG 24 hr capsule Take 1 capsule (240 mg total) by mouth daily.  30 capsule  11  . fenofibrate micronized (ANTARA) 130 MG capsule Take 130 mg by mouth daily.        . IRON PO Take 65 mg elemental calcium/kg/hr by mouth daily.        Marland Kitchen omeprazole (PRILOSEC) 40 MG capsule USE AS NEEDED FOR INDIGESTION  30 capsule  5  . prochlorperazine (COMPAZINE) 10 MG tablet as needed.       Current Facility-Administered Medications  Medication Dose Route Frequency Provider Last Rate Last Dose  . 0.9 %   sodium chloride infusion  500 mL Intravenous Continuous Yancey Flemings, MD         No Known Allergies   Family History  Problem Relation Age of Onset  . Coronary artery disease Neg Hx   . Arthritis Mother   . Diabetes Brother   . Diabetes Father   . Colon cancer Neg Hx      History   Social History  . Marital Status: Divorced    Spouse Name: N/A    Number of Children: 2  . Years of Education: N/A   Occupational History  . Physical wk    Social History Main Topics  . Smoking status: Former Games developer  . Smokeless tobacco: Never Used  . Alcohol Use: 3.6 oz/week    2 Glasses of wine, 2 Cans of beer, 2 Shots of liquor per week  . Drug Use: No  . Sexually Active: Yes   Other Topics Concern  . None   Social History Narrative  . None     REVIEW OF SYSTEMS - PERTINENT POSITIVES ONLY: ROS otherwise negative times 11 systems.  EXAMCeasar Mons Vitals:   09/24/10 0853  BP: 112/72  Pulse: 56  Temp: 97.2 F (36.2 C)    HENT:  Head: Normocephalic and atraumatic.  Eyes: Conjunctivae are normal. Pupils are equal, round, and reactive to light. No scleral icterus.  Neck: Normal range of motion. Neck supple. No tracheal deviation present. No thyromegaly present.  Cardiovascular: Normal rate, regular rhythm, normal heart sounds and intact distal pulses.  Exam reveals no gallop and no friction rub.   No murmur heard. Respiratory: Effort normal and breath sounds normal. No respiratory distress. No wheezes, rales or rhonchi, no chest wall tenderness.  GI: Soft. Bowel sounds are normal. The abdomen is soft and nontender.  There is no rebound and no guarding.  Musculoskeletal: Normal range of motion. Extremities are nontender.  Lymphadenopathy: No cervical, preauricular, postauricular or axillary adenopathy is present Neurological: Alert and oriented to person, place, and time. Coordination normal.  Skin: Skin is warm and dry. No rash noted. No diaphoresis. No erythema. No pallor.    Psychiatric: Normal mood and affect. Behavior is normal. Judgment and thought content normal.    LABORATORY RESULTS: Path and endoscopy reports reviewed.  Adenocarcinoma with intestinal differentiation.     RADIOLOGY RESULTS: Ct scan chest/abd/pelvis images and report reviewed.  Thickening of GE jxn at site of mass.  Gastrohepatic lymph nodes.  Indeterminate retrocrural nodes.   Primary adenocarcinoma of GE junction Getting neoadjuvant chemoradiation. Plan rescan after treatment. Reviewed esophagogastrectomy with patient; risks, benefits, surgical procedure, post op course, and need for dx laparoscopy.   Refer to Dr. Edwyna Shell for combined treatment. Follow up in 5 weeks.       Maudry Diego MD Surgical Oncology, General and Endocrine Surgery [facility]      Visit Diagnoses: 1. Primary adenocarcinoma of GE junction     Primary Care Physician: Sonda Primes, MD, MD

## 2010-09-27 ENCOUNTER — Encounter: Payer: Self-pay | Admitting: Internal Medicine

## 2010-09-27 ENCOUNTER — Ambulatory Visit (INDEPENDENT_AMBULATORY_CARE_PROVIDER_SITE_OTHER): Payer: Medicare Other | Admitting: Internal Medicine

## 2010-09-27 VITALS — BP 110/60 | HR 64 | Ht 71.0 in | Wt 210.0 lb

## 2010-09-27 DIAGNOSIS — R1310 Dysphagia, unspecified: Secondary | ICD-10-CM

## 2010-09-27 DIAGNOSIS — C160 Malignant neoplasm of cardia: Secondary | ICD-10-CM

## 2010-09-27 NOTE — Progress Notes (Signed)
HISTORY OF PRESENT ILLNESS:  Jon [name] is a 65 y.o. male with general medical problems as listed below. He was evaluated in the office 08/24/2010 regarding progressive intermittent solid food dysphagia. He subsequently underwent upper endoscopy on July 25 and was found to have a bulky tumor involving the gastric cardia and distal esophagus. Biopsies revealed adenocarcinoma. He has subsequently been seen by oncology, radiation oncology, and general surgery. CT scan revealed thickening in the area of the tumor as well as gastrohepatic lymph nodes. Current plans are for neoadjuvant chemoradiation therapy with possible surgical resection thereafter. He has undergone 1 session of chemotherapy and is currently undergoing radiation therapy. He tells me that he is tolerating treatment well. He states that his dysphagia is no worse, actually better. No other GI complaints.  REVIEW OF SYSTEMS:  All non-GI ROS negative except for insomnia  Past Medical History  Diagnosis Date  . Atrial fibrillation   . Colon polyp   . Other and unspecified hyperlipidemia   . Unspecified sleep apnea   . Glaucoma   . Cataract   . GERD (gastroesophageal reflux disease)   . H. pylori infection 2011  . Diverticulosis   . Cancer     Gastroesophageal   . Sleep apnea     Past Surgical History  Procedure Date  . Tonsillectomy   . Elbow bursa surgery   . Colonoscopy   . Leg surgery 1980    right leg   . Cataract extraction     bilateral    Social History MONTAVIUS SUBRAMANIAM  reports that he quit smoking about 5 years ago. He has never used smokeless tobacco. He reports that he drinks about 3.6 ounces of alcohol per week. He reports that he does not use illicit drugs.  family history includes Arthritis in his mother and Diabetes in his brother and father.  There is no history of Coronary artery disease and Colon cancer.  No Known Allergies     PHYSICAL EXAMINATION:  Vital signs: BP 110/60  Pulse 64   Ht 5\' 11"  (1.803 m)  Wt 210 lb (95.255 kg)  BMI 29.29 kg/m2 General: Well-developed, well-nourished, no acute distress HEENT: Sclerae are anicteric, conjunctiva pink. Oral mucosa intact Lungs: Clear Heart: Regular Abdomen: soft, nontender, nondistended, no obvious ascites, no peritoneal signs, normal bowel sounds. No organomegaly. Extremities: No edema Psychiatric: alert and oriented x3. Cooperative    ASSESSMENT:  #1. Adenocarcinoma of the gastroesophageal junction. Currently undergoing neoadjuvant therapy which he is tolerating well.   PLAN:  #1. Treatment plans per his primary team. If he were to develop problems with odynophagia or significant dysphagia, he may require temporary PEG placement. However, right now, he is doing well. GI followup as needed per his oncology and surgical oncology team

## 2010-09-29 ENCOUNTER — Encounter (HOSPITAL_BASED_OUTPATIENT_CLINIC_OR_DEPARTMENT_OTHER): Payer: Medicare Other | Admitting: Oncology

## 2010-09-29 ENCOUNTER — Other Ambulatory Visit: Payer: Self-pay | Admitting: Oncology

## 2010-09-29 DIAGNOSIS — C160 Malignant neoplasm of cardia: Secondary | ICD-10-CM

## 2010-09-29 DIAGNOSIS — C159 Malignant neoplasm of esophagus, unspecified: Secondary | ICD-10-CM

## 2010-09-29 DIAGNOSIS — Z5111 Encounter for antineoplastic chemotherapy: Secondary | ICD-10-CM

## 2010-09-29 LAB — CBC WITH DIFFERENTIAL/PLATELET
BASO%: 0.6 % (ref 0.0–2.0)
Basophils Absolute: 0 10*3/uL (ref 0.0–0.1)
EOS%: 4.3 % (ref 0.0–7.0)
HGB: 11.5 g/dL — ABNORMAL LOW (ref 13.0–17.1)
MCH: 22.9 pg — ABNORMAL LOW (ref 27.2–33.4)
NEUT#: 2.5 10*3/uL (ref 1.5–6.5)
RDW: 18.1 % — ABNORMAL HIGH (ref 11.0–14.6)
WBC: 3.3 10*3/uL — ABNORMAL LOW (ref 4.0–10.3)
lymph#: 0.3 10*3/uL — ABNORMAL LOW (ref 0.9–3.3)

## 2010-10-01 ENCOUNTER — Encounter: Payer: Self-pay | Admitting: Thoracic Surgery

## 2010-10-01 ENCOUNTER — Ambulatory Visit (INDEPENDENT_AMBULATORY_CARE_PROVIDER_SITE_OTHER): Payer: Medicare Other | Admitting: Thoracic Surgery

## 2010-10-01 VITALS — BP 108/69 | HR 56 | Resp 20 | Ht 71.0 in | Wt 210.0 lb

## 2010-10-01 DIAGNOSIS — C169 Malignant neoplasm of stomach, unspecified: Secondary | ICD-10-CM

## 2010-10-01 NOTE — Progress Notes (Signed)
HPI this 38 male was having some dysphagia. He underwent endoscopy by Dr. Marina Goodell which revealed A. adenocarcinoma of the gastric gastroesophageal junction. The tumor was at the gastroesophageal junction but also extended into the gastric cardia. A PET scan showed increased uptake at the gastric cardia and the distal most gastroesophageal junction. There was no uptake in the perigastric nodes. He was presented and cancer conference and started on neoadjuvant chemotherapy therapy and radiation therapy. He was seen by Dr. Donell Beers in consultation. He is referred here for consultation regarding resection. Since it was at the gastroesophageal junction ,he will require at least a 40-50% gastrectomy and distal esophagectomy with reconstruction in the right chest. 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 He will complete his neoadjuvant therapy in 5 weeks. I discussed the risk and complications of this surgery with him today. I will see him back again in 5 weeks for further discussion. He will need to have cardiac clearance done by Dr. Dietrich Pates. His PET scan and pulmonary function test at that time. I will coordinate his surgery with Dr. Donell Beers. Current Outpatient Prescriptions  Medication Sig Dispense Refill  . Cholecalciferol 1000 UNITS tablet Take 1,000 Units by mouth daily.        Marland Kitchen diltiazem (DILACOR XR) 240 MG 24 hr capsule Take 1 capsule (240 mg total) by mouth daily.  30 capsule  11  . fenofibrate micronized (ANTARA) 130 MG capsule Take 130 mg by mouth daily.        . IRON PO Take 65 mg elemental calcium/kg/hr by mouth daily.        Marland Kitchen omeprazole (PRILOSEC) 40 MG capsule Take 1 mg by mouth daily.       . prochlorperazine (COMPAZINE) 10 MG tablet Take 10 mg by mouth as needed.       Marland Kitchen aspirin 81 MG EC tablet Take 81 mg by mouth daily.         Current Facility-Administered Medications  Medication Dose Route Frequency Provider Last Rate Last Dose  . 0.9 %  sodium chloride infusion  500 mL Intravenous Continuous  Yancey Flemings, MD          Physical Exam  Constitutional: He is oriented to person, place, and time. He appears well-developed and well-nourished.  HENT:  Head: Normocephalic and atraumatic.  Right Ear: External ear normal.  Left Ear: External ear normal.  Eyes: Pupils are equal, round, and reactive to light.  Neck: Normal range of motion. Neck supple.  Cardiovascular: Normal rate and regular rhythm.   Murmur heard. Pulmonary/Chest: Effort normal and breath sounds normal. He exhibits no tenderness.  Abdominal: Soft. Bowel sounds are normal. He exhibits no distension and no mass. There is no tenderness.  Musculoskeletal: Normal range of motion.  Lymphadenopathy:    He has no cervical adenopathy.  Neurological: He is alert and oriented to person, place, and time.  Skin: Skin is warm and dry.  Psychiatric: He has a normal mood and affect. His behavior is normal. Judgment and thought content normal.     Diagnostic tests: PET scan shows increased uptake in the gastric cardia and the distal gastroesophageal junction.   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

## 2010-10-06 ENCOUNTER — Encounter (HOSPITAL_BASED_OUTPATIENT_CLINIC_OR_DEPARTMENT_OTHER): Payer: Medicare Other | Admitting: Oncology

## 2010-10-06 DIAGNOSIS — Z5111 Encounter for antineoplastic chemotherapy: Secondary | ICD-10-CM

## 2010-10-06 DIAGNOSIS — C160 Malignant neoplasm of cardia: Secondary | ICD-10-CM

## 2010-10-13 ENCOUNTER — Encounter (HOSPITAL_BASED_OUTPATIENT_CLINIC_OR_DEPARTMENT_OTHER): Payer: Medicare Other | Admitting: Oncology

## 2010-10-13 ENCOUNTER — Other Ambulatory Visit: Payer: Self-pay | Admitting: Oncology

## 2010-10-13 DIAGNOSIS — Z5111 Encounter for antineoplastic chemotherapy: Secondary | ICD-10-CM

## 2010-10-13 DIAGNOSIS — C159 Malignant neoplasm of esophagus, unspecified: Secondary | ICD-10-CM

## 2010-10-13 DIAGNOSIS — C160 Malignant neoplasm of cardia: Secondary | ICD-10-CM

## 2010-10-13 LAB — COMPREHENSIVE METABOLIC PANEL
Albumin: 3.9 g/dL (ref 3.5–5.2)
CO2: 26 mEq/L (ref 19–32)
Chloride: 101 mEq/L (ref 96–112)
Glucose, Bld: 107 mg/dL — ABNORMAL HIGH (ref 70–99)
Potassium: 3.9 mEq/L (ref 3.5–5.3)
Sodium: 136 mEq/L (ref 135–145)
Total Protein: 6.9 g/dL (ref 6.0–8.3)

## 2010-10-13 LAB — CBC WITH DIFFERENTIAL/PLATELET
Basophils Absolute: 0 10*3/uL (ref 0.0–0.1)
Eosinophils Absolute: 0.1 10*3/uL (ref 0.0–0.5)
HGB: 12.4 g/dL — ABNORMAL LOW (ref 13.0–17.1)
MCV: 71.7 fL — ABNORMAL LOW (ref 79.3–98.0)
NEUT#: 1.7 10*3/uL (ref 1.5–6.5)
RDW: 19.8 % — ABNORMAL HIGH (ref 11.0–14.6)
lymph#: 0.4 10*3/uL — ABNORMAL LOW (ref 0.9–3.3)

## 2010-10-20 ENCOUNTER — Encounter (HOSPITAL_BASED_OUTPATIENT_CLINIC_OR_DEPARTMENT_OTHER): Payer: Medicare Other | Admitting: Oncology

## 2010-10-20 ENCOUNTER — Other Ambulatory Visit: Payer: Self-pay | Admitting: Oncology

## 2010-10-20 DIAGNOSIS — C160 Malignant neoplasm of cardia: Secondary | ICD-10-CM

## 2010-10-20 DIAGNOSIS — Z5111 Encounter for antineoplastic chemotherapy: Secondary | ICD-10-CM

## 2010-10-20 DIAGNOSIS — C159 Malignant neoplasm of esophagus, unspecified: Secondary | ICD-10-CM

## 2010-10-20 LAB — CBC WITH DIFFERENTIAL/PLATELET
BASO%: 0.6 % (ref 0.0–2.0)
Eosinophils Absolute: 0.1 10*3/uL (ref 0.0–0.5)
MONO#: 0.3 10*3/uL (ref 0.1–0.9)
MONO%: 16.5 % — ABNORMAL HIGH (ref 0.0–14.0)
NEUT#: 1.2 10*3/uL — ABNORMAL LOW (ref 1.5–6.5)
RBC: 4.98 10*6/uL (ref 4.20–5.82)
RDW: 20.6 % — ABNORMAL HIGH (ref 11.0–14.6)
WBC: 1.8 10*3/uL — ABNORMAL LOW (ref 4.0–10.3)
nRBC: 0 % (ref 0–0)

## 2010-10-27 ENCOUNTER — Other Ambulatory Visit: Payer: Self-pay | Admitting: Oncology

## 2010-10-27 ENCOUNTER — Encounter (HOSPITAL_BASED_OUTPATIENT_CLINIC_OR_DEPARTMENT_OTHER): Payer: Medicare Other | Admitting: Oncology

## 2010-10-27 DIAGNOSIS — Z5111 Encounter for antineoplastic chemotherapy: Secondary | ICD-10-CM

## 2010-10-27 DIAGNOSIS — C159 Malignant neoplasm of esophagus, unspecified: Secondary | ICD-10-CM

## 2010-10-27 DIAGNOSIS — C160 Malignant neoplasm of cardia: Secondary | ICD-10-CM

## 2010-10-27 DIAGNOSIS — D709 Neutropenia, unspecified: Secondary | ICD-10-CM

## 2010-10-27 LAB — COMPREHENSIVE METABOLIC PANEL

## 2010-10-27 LAB — CBC WITH DIFFERENTIAL/PLATELET
BASO%: 0.6 % (ref 0.0–2.0)
HCT: 35.9 % — ABNORMAL LOW (ref 38.4–49.9)
HGB: 12 g/dL — ABNORMAL LOW (ref 13.0–17.1)
MCHC: 33.4 g/dL (ref 32.0–36.0)
MONO#: 0.3 10*3/uL (ref 0.1–0.9)
NEUT%: 63.8 % (ref 39.0–75.0)
RDW: 21.2 % — ABNORMAL HIGH (ref 11.0–14.6)
WBC: 1.6 10*3/uL — ABNORMAL LOW (ref 4.0–10.3)
lymph#: 0.2 10*3/uL — ABNORMAL LOW (ref 0.9–3.3)

## 2010-10-29 ENCOUNTER — Ambulatory Visit: Payer: Self-pay | Admitting: Pulmonary Disease

## 2010-11-02 ENCOUNTER — Other Ambulatory Visit: Payer: Self-pay | Admitting: *Deleted

## 2010-11-02 ENCOUNTER — Other Ambulatory Visit: Payer: Medicare Other

## 2010-11-02 DIAGNOSIS — I10 Essential (primary) hypertension: Secondary | ICD-10-CM

## 2010-11-02 DIAGNOSIS — Z8679 Personal history of other diseases of the circulatory system: Secondary | ICD-10-CM

## 2010-11-02 DIAGNOSIS — R7309 Other abnormal glucose: Secondary | ICD-10-CM

## 2010-11-03 ENCOUNTER — Other Ambulatory Visit (INDEPENDENT_AMBULATORY_CARE_PROVIDER_SITE_OTHER): Payer: Medicare Other

## 2010-11-03 ENCOUNTER — Encounter: Payer: Self-pay | Admitting: Thoracic Surgery

## 2010-11-03 ENCOUNTER — Other Ambulatory Visit: Payer: Self-pay | Admitting: Thoracic Surgery

## 2010-11-03 ENCOUNTER — Ambulatory Visit (INDEPENDENT_AMBULATORY_CARE_PROVIDER_SITE_OTHER): Payer: Medicare Other | Admitting: Thoracic Surgery

## 2010-11-03 ENCOUNTER — Encounter (HOSPITAL_BASED_OUTPATIENT_CLINIC_OR_DEPARTMENT_OTHER): Payer: Medicare Other | Admitting: Oncology

## 2010-11-03 ENCOUNTER — Other Ambulatory Visit: Payer: Self-pay | Admitting: Oncology

## 2010-11-03 VITALS — BP 104/68 | HR 64 | Resp 16 | Ht 71.0 in | Wt 210.0 lb

## 2010-11-03 DIAGNOSIS — Z8679 Personal history of other diseases of the circulatory system: Secondary | ICD-10-CM

## 2010-11-03 DIAGNOSIS — C160 Malignant neoplasm of cardia: Secondary | ICD-10-CM

## 2010-11-03 DIAGNOSIS — C169 Malignant neoplasm of stomach, unspecified: Secondary | ICD-10-CM

## 2010-11-03 DIAGNOSIS — Z5111 Encounter for antineoplastic chemotherapy: Secondary | ICD-10-CM

## 2010-11-03 DIAGNOSIS — C159 Malignant neoplasm of esophagus, unspecified: Secondary | ICD-10-CM

## 2010-11-03 DIAGNOSIS — R7309 Other abnormal glucose: Secondary | ICD-10-CM

## 2010-11-03 DIAGNOSIS — I10 Essential (primary) hypertension: Secondary | ICD-10-CM

## 2010-11-03 LAB — CBC WITH DIFFERENTIAL/PLATELET
Basophils Absolute: 0 10*3/uL (ref 0.0–0.1)
Eosinophils Absolute: 0.1 10*3/uL (ref 0.0–0.5)
HGB: 12.3 g/dL — ABNORMAL LOW (ref 13.0–17.1)
LYMPH%: 20.8 % (ref 14.0–49.0)
MCV: 73.1 fL — ABNORMAL LOW (ref 79.3–98.0)
MONO#: 0.3 10*3/uL (ref 0.1–0.9)
MONO%: 17.7 % — ABNORMAL HIGH (ref 0.0–14.0)
NEUT#: 1.1 10*3/uL — ABNORMAL LOW (ref 1.5–6.5)
Platelets: 171 10*3/uL (ref 140–400)
RBC: 5.02 10*6/uL (ref 4.20–5.82)
WBC: 1.9 10*3/uL — ABNORMAL LOW (ref 4.0–10.3)
nRBC: 0 % (ref 0–0)

## 2010-11-03 LAB — COMPREHENSIVE METABOLIC PANEL
ALT: 22 U/L (ref 0–53)
AST: 26 U/L (ref 0–37)
Alkaline Phosphatase: 51 U/L (ref 39–117)
BUN: 10 mg/dL (ref 6–23)
Creatinine, Ser: 1 mg/dL (ref 0.4–1.5)
Total Bilirubin: 0.5 mg/dL (ref 0.3–1.2)

## 2010-11-03 NOTE — Progress Notes (Signed)
HPI the patient returns after receiving radiation and chemotherapy. This stopped 1 week ago. He had minimal symptoms with his radiation and chemotherapy. We plan to repeat his PET scan in 3-4 weeks. We will obtain cardiac clearance from his cardiologist Dr. Tenny Craw. We will get pulmonary function test. We will see him back again in 3-4 weeks and schedule his esophagogastrectomy accordingly.   Current Outpatient Prescriptions  Medication Sig Dispense Refill  . aspirin 81 MG EC tablet Take 81 mg by mouth daily.        . Cholecalciferol 1000 UNITS tablet Take 1,000 Units by mouth daily.        Marland Kitchen diltiazem (DILACOR XR) 240 MG 24 hr capsule Take 1 capsule (240 mg total) by mouth daily.  30 capsule  11  . fenofibrate micronized (ANTARA) 130 MG capsule Take 130 mg by mouth daily.        . IRON PO Take 65 mg elemental calcium/kg/hr by mouth daily.        Marland Kitchen omeprazole (PRILOSEC) 40 MG capsule Take 1 mg by mouth daily.       . prochlorperazine (COMPAZINE) 10 MG tablet Take 10 mg by mouth as needed.        Current Facility-Administered Medications  Medication Dose Route Frequency Provider Last Rate Last Dose  . 0.9 %  sodium chloride infusion  500 mL Intravenous Continuous Yancey Flemings, MD         Review of Systems: Unchanged   Physical Exam  Cardiovascular: Normal rate, regular rhythm and normal heart sounds.   Pulmonary/Chest: Effort normal. No respiratory distress.     Diagnostic Tests: None PET scan ordered   Impression: Gastroesophageal cancer status post chemotherapy and radiate   Plan: Return in 3-4 weeks

## 2010-11-09 ENCOUNTER — Encounter: Payer: Self-pay | Admitting: Pulmonary Disease

## 2010-11-09 ENCOUNTER — Ambulatory Visit (INDEPENDENT_AMBULATORY_CARE_PROVIDER_SITE_OTHER): Payer: Medicare Other | Admitting: Pulmonary Disease

## 2010-11-09 ENCOUNTER — Ambulatory Visit (INDEPENDENT_AMBULATORY_CARE_PROVIDER_SITE_OTHER): Payer: Medicare Other | Admitting: Internal Medicine

## 2010-11-09 ENCOUNTER — Encounter: Payer: Self-pay | Admitting: Internal Medicine

## 2010-11-09 VITALS — BP 108/60 | HR 84 | Temp 97.9°F | Resp 16 | Wt 208.0 lb

## 2010-11-09 VITALS — BP 108/70 | HR 61 | Temp 98.2°F | Ht 71.0 in | Wt 211.6 lb

## 2010-11-09 DIAGNOSIS — C169 Malignant neoplasm of stomach, unspecified: Secondary | ICD-10-CM

## 2010-11-09 DIAGNOSIS — Z8679 Personal history of other diseases of the circulatory system: Secondary | ICD-10-CM

## 2010-11-09 DIAGNOSIS — G4733 Obstructive sleep apnea (adult) (pediatric): Secondary | ICD-10-CM

## 2010-11-09 DIAGNOSIS — K219 Gastro-esophageal reflux disease without esophagitis: Secondary | ICD-10-CM

## 2010-11-09 DIAGNOSIS — R1310 Dysphagia, unspecified: Secondary | ICD-10-CM

## 2010-11-09 NOTE — Assessment & Plan Note (Signed)
The patient is doing well with CPAP, and denies any issues with his mask fit or pressure.  He is sleeping well, and is satisfied with his daytime alertness.  I have asked him to continue on CPAP, and to keep up with the mask changes and supplies.  He will followup with me in one year.

## 2010-11-09 NOTE — Patient Instructions (Signed)
Continue on cpap followup with me in one year.

## 2010-11-09 NOTE — Progress Notes (Signed)
  Subjective:    Patient ID: Jon [name], male    DOB: 11-21-1945, 65 y.o.   MRN: [id number]  HPI The patient comes in today for followup of his obstructive sleep apnea.  He is wearing CPAP compliantly, and is satisfied with his sleep and daytime alertness.  He is due for new nasal pillows, but otherwise has kept up with his supplies.  He is having no issues with his CPAP machine.   Review of Systems  Constitutional: Negative for fever and unexpected weight change.  HENT: Negative for ear pain, nosebleeds, congestion, sore throat, rhinorrhea, sneezing, trouble swallowing, dental problem, postnasal drip and sinus pressure.   Eyes: Negative for redness and itching.  Respiratory: Negative for cough, chest tightness, shortness of breath and wheezing.   Cardiovascular: Negative for palpitations and leg swelling.  Gastrointestinal: Negative for nausea and vomiting.  Genitourinary: Negative for dysuria.  Musculoskeletal: Negative for joint swelling.  Skin: Negative for rash.  Neurological: Negative for headaches.  Hematological: Does not bruise/bleed easily.  Psychiatric/Behavioral: Negative for dysphoric mood. The patient is not nervous/anxious.        Objective:   Physical Exam Well-developed male in no acute distress Nose without purulence or discharge. No skin breakdown or pressure necrosis from the CPAP mask Lower extremities without edema, no cyanosis noted Alert and oriented, does not appear to be sleepy, moves all 4 extremities.       Assessment & Plan:

## 2010-11-09 NOTE — Assessment & Plan Note (Signed)
8/12 PET: IMPRESSION:  Abnormal malignant range F D G uptake at the site of biopsy-proven  gastric cancer. Small perigastric lymph nodes are not FDG avid  above background level, which may reflect benignity or false  negative appearance due to their small size.  No FDG avid metastatic disease is visualized in the neck, chest, or  pelvis.  Original Report Authenticated By: Harrel Lemon, M.D.    Chemo and XRT

## 2010-11-09 NOTE — Assessment & Plan Note (Signed)
Continue with current prescription therapy as reflected on the Med list.  

## 2010-11-09 NOTE — Assessment & Plan Note (Signed)
Controlled Continue with current prescription therapy as reflected on the Med list.  

## 2010-11-09 NOTE — Progress Notes (Signed)
  Subjective:    Patient ID: Jon [name], male    DOB: 1945/05/31, 65 y.o.   MRN: [id number]  HPI  The patient presents for a follow-up of  chronic hypertension, chronic dyslipidemia controlled with medicines. F/u w/gastric ca now treated w/XRT and chemo. No wt loss per pt - good appetite    Review of Systems  Constitutional: Negative for appetite change, fatigue and unexpected weight change.  HENT: Negative for nosebleeds, congestion, sore throat, sneezing, trouble swallowing and neck pain.   Eyes: Negative for itching and visual disturbance.  Respiratory: Negative for cough.   Cardiovascular: Negative for chest pain, palpitations and leg swelling.  Gastrointestinal: Negative for nausea, diarrhea, blood in stool and abdominal distention.  Genitourinary: Negative for frequency and hematuria.  Musculoskeletal: Negative for back pain, joint swelling and gait problem.  Skin: Negative for rash.  Neurological: Negative for dizziness, tremors, speech difficulty and weakness.  Psychiatric/Behavioral: Negative for sleep disturbance, dysphoric mood and agitation. The patient is not nervous/anxious.    Wt Readings from Last 3 Encounters:  11/09/10 208 lb (94.348 kg)  11/03/10 210 lb (95.255 kg)  10/01/10 210 lb (95.255 kg)       Objective:   Physical Exam  Constitutional: He is oriented to person, place, and time. He appears well-developed.  HENT:  Mouth/Throat: Oropharynx is clear and moist.  Eyes: Conjunctivae are normal. Pupils are equal, round, and reactive to light.  Neck: Normal range of motion. No JVD present. No thyromegaly present.  Cardiovascular: Normal rate, regular rhythm, normal heart sounds and intact distal pulses.  Exam reveals no gallop and no friction rub.   No murmur heard. Pulmonary/Chest: Effort normal and breath sounds normal. No respiratory distress. He has no wheezes. He has no rales. He exhibits no tenderness.  Abdominal: Soft. Bowel sounds are normal. He  exhibits no distension and no mass. There is no tenderness. There is no rebound and no guarding.  Musculoskeletal: Normal range of motion. He exhibits no edema and no tenderness.  Lymphadenopathy:    He has no cervical adenopathy.  Neurological: He is alert and oriented to person, place, and time. He has normal reflexes. No cranial nerve deficit. He exhibits normal muscle tone. Coordination normal.  Skin: Skin is warm and dry. No rash noted.  Psychiatric: He has a normal mood and affect. His behavior is normal. Judgment and thought content normal.    Lab Results  Component Value Date   WBC 5.5 12/22/2009   HGB 12.3* 11/03/2010   HCT 36.7* 11/03/2010   PLT 171 11/03/2010   GLUCOSE 112* 11/03/2010   CHOL 125 12/22/2009   TRIG 98.0 12/22/2009   HDL 42.50 12/22/2009   LDLCALC 63 12/22/2009   ALT 22 11/03/2010   AST 26 11/03/2010   NA 138 11/03/2010   K 4.3 11/03/2010   CL 106 11/03/2010   CREATININE 1.0 11/03/2010   BUN 10 11/03/2010   CO2 27 11/03/2010   TSH 0.72 12/22/2009   PSA 0.91 12/22/2009   HGBA1C 7.8* 11/03/2010         Assessment & Plan:

## 2010-11-09 NOTE — Assessment & Plan Note (Signed)
Dr Shelle Iron -- on CPAP

## 2010-11-09 NOTE — Assessment & Plan Note (Signed)
resolved 

## 2010-11-10 ENCOUNTER — Encounter (HOSPITAL_BASED_OUTPATIENT_CLINIC_OR_DEPARTMENT_OTHER): Payer: Medicare Other | Admitting: Oncology

## 2010-11-10 ENCOUNTER — Other Ambulatory Visit: Payer: Self-pay | Admitting: Oncology

## 2010-11-10 DIAGNOSIS — C160 Malignant neoplasm of cardia: Secondary | ICD-10-CM

## 2010-11-10 DIAGNOSIS — C159 Malignant neoplasm of esophagus, unspecified: Secondary | ICD-10-CM

## 2010-11-10 DIAGNOSIS — Z5111 Encounter for antineoplastic chemotherapy: Secondary | ICD-10-CM

## 2010-11-10 LAB — CBC WITH DIFFERENTIAL/PLATELET
BASO%: 1.2 % (ref 0.0–2.0)
LYMPH%: 24.2 % (ref 14.0–49.0)
MCHC: 34.5 g/dL (ref 32.0–36.0)
MCV: 74 fL — ABNORMAL LOW (ref 79.3–98.0)
MONO%: 29.2 % — ABNORMAL HIGH (ref 0.0–14.0)
Platelets: 193 10*3/uL (ref 140–400)
RBC: 4.89 10*6/uL (ref 4.20–5.82)
nRBC: 1 % — ABNORMAL HIGH (ref 0–0)

## 2010-11-15 ENCOUNTER — Ambulatory Visit (INDEPENDENT_AMBULATORY_CARE_PROVIDER_SITE_OTHER): Payer: Medicare Other | Admitting: Internal Medicine

## 2010-11-15 ENCOUNTER — Encounter: Payer: Self-pay | Admitting: Internal Medicine

## 2010-11-15 VITALS — BP 111/73 | HR 69 | Ht 71.0 in | Wt 207.0 lb

## 2010-11-15 DIAGNOSIS — E785 Hyperlipidemia, unspecified: Secondary | ICD-10-CM

## 2010-11-15 DIAGNOSIS — Z8679 Personal history of other diseases of the circulatory system: Secondary | ICD-10-CM

## 2010-11-15 DIAGNOSIS — C169 Malignant neoplasm of stomach, unspecified: Secondary | ICD-10-CM

## 2010-11-15 DIAGNOSIS — I10 Essential (primary) hypertension: Secondary | ICD-10-CM

## 2010-11-15 NOTE — Progress Notes (Signed)
HPI Patient is a 65 year old with a history of PAF and dyslipidemia.  I saw him in clinic in July 2011.  Echo in 2007 showed normal LV function.   He is now being evaluated for surgery. Breathing is good.  Walking  No chest tightness or excess SOB.   No palpitaions.  No dizziness.  No Known Allergies  Current Outpatient Prescriptions  Medication Sig Dispense Refill  . aspirin 81 MG EC tablet Take 81 mg by mouth daily.        . Cholecalciferol 1000 UNITS tablet Take 1,000 Units by mouth daily.        Marland Kitchen diltiazem (CARDIZEM CD) 240 MG 24 hr capsule 1 tab daily      . fenofibrate micronized (ANTARA) 130 MG capsule Take 130 mg by mouth daily.        . IRON PO Take 65 mg elemental calcium/kg/hr by mouth daily.        Marland Kitchen omeprazole (PRILOSEC) 40 MG capsule Take 1 mg by mouth daily.        Current Facility-Administered Medications  Medication Dose Route Frequency Provider Last Rate Last Dose  . 0.9 %  sodium chloride infusion  500 mL Intravenous Continuous Yancey Flemings, MD        Past Medical History  Diagnosis Date  . Atrial fibrillation   . Colon polyp   . Other and unspecified hyperlipidemia   . Unspecified sleep apnea   . Glaucoma   . Cataract   . GERD (gastroesophageal reflux disease)   . H. pylori infection 2011  . Diverticulosis   . Sleep apnea   . Cancer     Gastroesophageal     Past Surgical History  Procedure Date  . Tonsillectomy   . Elbow bursa surgery   . Colonoscopy   . Leg surgery 1980    right leg   . Cataract extraction     bilateral    Family History  Problem Relation Age of Onset  . Coronary artery disease Neg Hx   . Arthritis Mother   . Diabetes Brother   . Diabetes Father   . Colon cancer Neg Hx     History   Social History  . Marital Status: Divorced    Spouse Name: N/A    Number of Children: 2  . Years of Education: N/A   Occupational History  . retired    Social History Main Topics  . Smoking status: Former Smoker -- 1.0 packs/day for  40 years    Types: Cigarettes    Quit date: 01/31/2005  . Smokeless tobacco: Never Used  . Alcohol Use: 3.6 oz/week    2 Glasses of wine, 2 Cans of beer, 2 Shots of liquor per week  . Drug Use: No  . Sexually Active: Yes   Other Topics Concern  . Not on file   Social History Narrative  . No narrative on file    Review of Systems:  All systems reviewed.  They are negative to the above problem except as previously stated.  Vital Signs: BP 111/73  Pulse 69  Ht 5\' 11"  (1.803 m)  Wt 207 lb (93.895 kg)  BMI 28.87 kg/m2  Physical Exam  Patient is in NAD   HEENT:  Normocephalic, atraumatic. EOMI, PERRLA.  Neck: JVP is normal. No thyromegaly. No bruits.  Lungs: clear to auscultation. No rales no wheezes.  Heart: Regular rate and rhythm. Normal S1, S2. No S3.   No significant murmurs. PMI not  displaced.  Abdomen:  Supple, nontender. Normal bowel sounds. No masses. No hepatomegaly.  Extremities:   Good distal pulses throughout. No lower extremity edema.  Musculoskeletal :moving all extremities.  Neuro:   alert and oriented x3.  CN II-XII grossly intact.  EKG:  Sinus rhythm.   Assessment and Plan:

## 2010-11-15 NOTE — Assessment & Plan Note (Signed)
Continue Antara.

## 2010-11-15 NOTE — Assessment & Plan Note (Signed)
Patient has undergone XRT and Chemo.  Being evaluated for possible surgery. From a cardiac standpoint I think he should do OK.  He could have afib in the post op period but this can be controlled medically.   No other cardiac testing needed.

## 2010-11-15 NOTE — Assessment & Plan Note (Signed)
In SR now.  No symptoms to suggest recurrent afib.  Keep on ASA.

## 2010-11-15 NOTE — Assessment & Plan Note (Signed)
Good control

## 2010-11-22 ENCOUNTER — Ambulatory Visit (HOSPITAL_COMMUNITY)
Admission: RE | Admit: 2010-11-22 | Discharge: 2010-11-22 | Disposition: A | Discharge status: Home / Self Care | Payer: Medicare Other | Source: Ambulatory Visit | Attending: Thoracic Surgery | Admitting: Thoracic Surgery

## 2010-11-22 DIAGNOSIS — Z79899 Other long term (current) drug therapy: Secondary | ICD-10-CM | POA: Insufficient documentation

## 2010-11-22 DIAGNOSIS — C159 Malignant neoplasm of esophagus, unspecified: Secondary | ICD-10-CM | POA: Insufficient documentation

## 2010-11-22 DIAGNOSIS — Z006 Encounter for examination for normal comparison and control in clinical research program: Secondary | ICD-10-CM | POA: Insufficient documentation

## 2010-11-22 DIAGNOSIS — C169 Malignant neoplasm of stomach, unspecified: Secondary | ICD-10-CM

## 2010-11-22 LAB — PULMONARY FUNCTION TEST

## 2010-11-22 MED ORDER — FLUDEOXYGLUCOSE F - 18 (FDG) INJECTION
15.1000 | Freq: Once | INTRAVENOUS | Status: AC | PRN
  Administered 2010-11-22: 15.1 via INTRAVENOUS

## 2010-11-24 ENCOUNTER — Encounter: Payer: Self-pay | Admitting: Thoracic Surgery

## 2010-11-24 ENCOUNTER — Ambulatory Visit (INDEPENDENT_AMBULATORY_CARE_PROVIDER_SITE_OTHER): Payer: Medicare Other | Admitting: Thoracic Surgery

## 2010-11-24 VITALS — BP 128/81 | HR 72 | Resp 18 | Ht 71.0 in | Wt 210.0 lb

## 2010-11-24 DIAGNOSIS — C160 Malignant neoplasm of cardia: Secondary | ICD-10-CM

## 2010-11-24 NOTE — Progress Notes (Signed)
HPI the patient returns after his PET scan. The PET scan shows resolution of uptake in the stomach. He appears to got an excellent response and maybe complete response to the radiation and chemotherapy. I last Dr. Yancey Flemings to rescope him. If he has had a complete response the question is whether to proceed with the esophagogastrectomy or not. I will discuss this with his medical oncologist as well as Dr. Donell Beers. I will see him back again in 3 weeks. He saw Dr. Tenny Craw and has a cardiac clearance. His pulmonary function test normal. Current Outpatient Prescriptions  Medication Sig Dispense Refill  . Cholecalciferol 1000 UNITS tablet Take 1,000 Units by mouth daily.        Marland Kitchen diltiazem (CARDIZEM CD) 240 MG 24 hr capsule 1 tab daily      . fenofibrate micronized (ANTARA) 130 MG capsule Take 130 mg by mouth daily.        . IRON PO Take 65 mg elemental calcium/kg/hr by mouth daily.        Marland Kitchen omeprazole (PRILOSEC) 40 MG capsule Take 1 mg by mouth daily.        Current Facility-Administered Medications  Medication Dose Route Frequency Provider Last Rate Last Dose  . 0.9 %  sodium chloride infusion  500 mL Intravenous Continuous Yancey Flemings, MD         Review of Systems: Unchanged   Physical Exam  Cardiovascular: Normal rate, regular rhythm and normal heart sounds.   Pulmonary/Chest: Effort normal and breath sounds normal.     Diagnostic Tests: PET scan shows complete resolution of uptake at GE junction   Impression: Adenocarcinoma of the GE junction status post radiation and chemotherapy   Plan: Return in 3 week repeat endoscopy

## 2010-11-25 ENCOUNTER — Other Ambulatory Visit: Payer: Self-pay | Admitting: Oncology

## 2010-11-25 ENCOUNTER — Encounter (HOSPITAL_BASED_OUTPATIENT_CLINIC_OR_DEPARTMENT_OTHER): Payer: Medicare Other | Admitting: Oncology

## 2010-11-25 DIAGNOSIS — Z923 Personal history of irradiation: Secondary | ICD-10-CM

## 2010-11-25 DIAGNOSIS — D509 Iron deficiency anemia, unspecified: Secondary | ICD-10-CM

## 2010-11-25 DIAGNOSIS — D709 Neutropenia, unspecified: Secondary | ICD-10-CM

## 2010-11-25 DIAGNOSIS — Z5111 Encounter for antineoplastic chemotherapy: Secondary | ICD-10-CM

## 2010-11-25 DIAGNOSIS — C160 Malignant neoplasm of cardia: Secondary | ICD-10-CM

## 2010-11-25 DIAGNOSIS — R970 Elevated carcinoembryonic antigen [CEA]: Secondary | ICD-10-CM

## 2010-11-25 DIAGNOSIS — C159 Malignant neoplasm of esophagus, unspecified: Secondary | ICD-10-CM

## 2010-11-25 LAB — CBC WITH DIFFERENTIAL/PLATELET
BASO%: 0.5 % (ref 0.0–2.0)
Eosinophils Absolute: 0.4 10*3/uL (ref 0.0–0.5)
LYMPH%: 28.8 % (ref 14.0–49.0)
MCHC: 34.3 g/dL (ref 32.0–36.0)
MONO#: 0.8 10*3/uL (ref 0.1–0.9)
MONO%: 17.9 % — ABNORMAL HIGH (ref 0.0–14.0)
NEUT#: 2.1 10*3/uL (ref 1.5–6.5)
Platelets: 241 10*3/uL (ref 140–400)
RBC: 4.64 10*6/uL (ref 4.20–5.82)
RDW: 26 % — ABNORMAL HIGH (ref 11.0–14.6)
WBC: 4.7 10*3/uL (ref 4.0–10.3)

## 2010-11-25 LAB — CEA: CEA: 4.7 ng/mL (ref 0.0–5.0)

## 2010-11-26 ENCOUNTER — Ambulatory Visit
Admission: RE | Admit: 2010-11-26 | Discharge: 2010-11-26 | Disposition: A | Discharge status: Home / Self Care | Payer: Medicare Other | Source: Ambulatory Visit | Attending: Radiation Oncology | Admitting: Radiation Oncology

## 2010-12-03 ENCOUNTER — Encounter: Payer: Self-pay | Admitting: Thoracic Surgery

## 2010-12-15 ENCOUNTER — Encounter: Payer: Self-pay | Admitting: Thoracic Surgery

## 2010-12-15 ENCOUNTER — Ambulatory Visit (INDEPENDENT_AMBULATORY_CARE_PROVIDER_SITE_OTHER): Payer: Medicare Other | Admitting: Thoracic Surgery

## 2010-12-15 ENCOUNTER — Telehealth: Payer: Self-pay | Admitting: Internal Medicine

## 2010-12-15 VITALS — BP 113/76 | HR 59 | Resp 16 | Ht 69.0 in | Wt 209.0 lb

## 2010-12-15 DIAGNOSIS — C160 Malignant neoplasm of cardia: Secondary | ICD-10-CM

## 2010-12-15 NOTE — Telephone Encounter (Signed)
Dr, Edwyna Shell would like Jon [name] to have an EGD in next few days as PET scan after chemoXRT has not shown any activity and trying to determine need for surgery.  He thought he had called about it and sent a note but is not certain of all that.   He called office today but said he was unable to reach Dr. Marina Goodell

## 2010-12-15 NOTE — Progress Notes (Signed)
HPI the patient returns today. Unfortunately he did not get his endoscopy. I called Dr. Leone Payor. Dr. Marina Goodell was not available. Dr. Chancy Hurter will hopefully arrange to do the endoscopy as soon as possible. I will see the patient back after the endoscopy.   Current Outpatient Prescriptions  Medication Sig Dispense Refill  . Cholecalciferol 1000 UNITS tablet Take 1,000 Units by mouth daily.        Marland Kitchen diltiazem (CARDIZEM CD) 240 MG 24 hr capsule 1 tab daily      . fenofibrate micronized (ANTARA) 130 MG capsule Take 130 mg by mouth daily.        . IRON PO Take 65 mg elemental calcium/kg/hr by mouth daily.        Marland Kitchen omeprazole (PRILOSEC) 40 MG capsule Take 1 mg by mouth daily.        Current Facility-Administered Medications  Medication Dose Route Frequency Provider Last Rate Last Dose  . 0.9 %  sodium chloride infusion  500 mL Intravenous Continuous Yancey Flemings, MD         Review of Systems: Unchanged   Physical Exam  Cardiovascular: Normal rate, regular rhythm and normal heart sounds.   Pulmonary/Chest: Effort normal and breath sounds normal.     Diagnostic Tests: None   Impression: Status post radiation and chemotherapy for gastroesophageal cancer.   Plan: Return after endoscopy.

## 2010-12-15 NOTE — Telephone Encounter (Signed)
Pt scheduled for previsit tomorrow at 8am, scheduled for EGD with Dr. Marina Goodell 12/17/10@4pm . Pt aware of appt dates and times.

## 2010-12-16 ENCOUNTER — Ambulatory Visit (AMBULATORY_SURGERY_CENTER): Payer: Medicare Other | Admitting: *Deleted

## 2010-12-16 VITALS — Ht 71.0 in | Wt 214.0 lb

## 2010-12-16 DIAGNOSIS — C160 Malignant neoplasm of cardia: Secondary | ICD-10-CM

## 2010-12-17 ENCOUNTER — Encounter: Payer: Self-pay | Admitting: Internal Medicine

## 2010-12-17 ENCOUNTER — Ambulatory Visit (AMBULATORY_SURGERY_CENTER): Payer: Medicare Other | Admitting: Internal Medicine

## 2010-12-17 VITALS — BP 109/74 | HR 53 | Temp 98.2°F | Resp 18 | Ht 71.0 in | Wt 210.0 lb

## 2010-12-17 DIAGNOSIS — K259 Gastric ulcer, unspecified as acute or chronic, without hemorrhage or perforation: Secondary | ICD-10-CM

## 2010-12-17 DIAGNOSIS — Z85028 Personal history of other malignant neoplasm of stomach: Secondary | ICD-10-CM

## 2010-12-17 DIAGNOSIS — C160 Malignant neoplasm of cardia: Secondary | ICD-10-CM

## 2010-12-17 MED ORDER — SODIUM CHLORIDE 0.9 % IV SOLN: 500.0000 mL | INTRAVENOUS | Status: DC

## 2010-12-17 NOTE — Patient Instructions (Signed)
Please refer to the blue and neon green sheets for instructions regarding diet and activity for the rest of today.  Resume previous medicines Follow soft diet- see handout

## 2010-12-20 ENCOUNTER — Encounter: Payer: Self-pay | Admitting: *Deleted

## 2010-12-20 ENCOUNTER — Telehealth: Payer: Self-pay

## 2010-12-20 NOTE — Telephone Encounter (Signed)

## 2010-12-21 ENCOUNTER — Encounter: Payer: Self-pay | Admitting: Thoracic Surgery

## 2010-12-21 ENCOUNTER — Telehealth: Payer: Self-pay | Admitting: Internal Medicine

## 2010-12-21 ENCOUNTER — Ambulatory Visit (INDEPENDENT_AMBULATORY_CARE_PROVIDER_SITE_OTHER): Payer: Medicare Other | Admitting: Thoracic Surgery

## 2010-12-21 VITALS — BP 112/72 | HR 61 | Resp 16 | Ht 71.0 in | Wt 210.0 lb

## 2010-12-21 DIAGNOSIS — C159 Malignant neoplasm of esophagus, unspecified: Secondary | ICD-10-CM

## 2010-12-21 NOTE — Progress Notes (Signed)
HPI patient returns today after his endoscopy. The endoscopy still shows an ulcer at the GE junction and cardia. Biopsies are still pending. I feel of since the ulcers field there is a patient with the esophagogastrectomy. He is somewhat reluctant to have the surgery. We will wait on the final biopsies. We need to proceed with surgery fairly soon because it has been a while since he had his radiation and chemotherapy he will probably require either transhiatal esophagogastrectomy or an Ivor Lewis reconstruction. I will call the patient is an is negative biopsy results. I discussed the findings with Dr. Marina Goodell.   Current Outpatient Prescriptions  Medication Sig Dispense Refill  . aspirin 81 MG tablet Take 81 mg by mouth daily.        . Cholecalciferol 1000 UNITS tablet Take 1,000 Units by mouth daily.       Marland Kitchen diltiazem (CARDIZEM CD) 240 MG 24 hr capsule 1 tab daily      . fenofibrate micronized (ANTARA) 130 MG capsule Take 130 mg by mouth daily.        . IRON PO Take 65 mg elemental calcium/kg/hr by mouth 2 (two) times daily.       Marland Kitchen omeprazole (PRILOSEC) 40 MG capsule Take 1 mg by mouth daily.        Current Facility-Administered Medications  Medication Dose Route Frequency Provider Last Rate Last Dose  . 0.9 %  sodium chloride infusion  500 mL Intravenous Continuous Yancey Flemings, MD         Review of Systems: No change   Physical Exam abdomen is soft no palpable masses lungs are clear to auscultation   Diagnostic Tests: Endoscopy by Dr. Marina Goodell biopsies pending   Impression: Adenocarcinoma of the gastroesophageal junction   Plan: Will discuss esophagogastrectomy

## 2010-12-21 NOTE — Telephone Encounter (Signed)
Dr. Edwyna Shell wanted to speak with Dr. Marina Goodell. Dr. Marina Goodell aware.

## 2010-12-28 ENCOUNTER — Telehealth: Payer: Self-pay | Admitting: Internal Medicine

## 2010-12-28 NOTE — Telephone Encounter (Signed)
Discussed results letter with pt from his biopsy. Pt aware.

## 2010-12-29 ENCOUNTER — Ambulatory Visit (INDEPENDENT_AMBULATORY_CARE_PROVIDER_SITE_OTHER): Payer: Medicare Other | Admitting: Thoracic Surgery

## 2010-12-29 ENCOUNTER — Encounter: Payer: Self-pay | Admitting: Thoracic Surgery

## 2010-12-29 VITALS — BP 138/83 | HR 62 | Resp 18 | Ht 71.0 in | Wt 218.0 lb

## 2010-12-29 DIAGNOSIS — C159 Malignant neoplasm of esophagus, unspecified: Secondary | ICD-10-CM

## 2010-12-29 NOTE — Progress Notes (Signed)
HPI the patient returns for discussion with biopsy findings the biopsy results were negative. I discussed the risk and benefits of the esophagogastrectomy with the patient. I feel because of persistent ulcer at the gastroesophageal junction then a resection is necessary. However the patient does not want to proceed at the present time. I explained to them that this would make subsequent surgery more difficult. I also explained that the chance of a cure would go down if there is a recurrence at the ulcer site. He is not want surgery at the present time. I plan to see him again in 2 months with a CT scan of the chest. He may need to have a repeat endoscopy at that time.  Current Outpatient Prescriptions  Medication Sig Dispense Refill  . aspirin 81 MG tablet Take 81 mg by mouth daily.        . Cholecalciferol 1000 UNITS tablet Take 1,000 Units by mouth daily.       Marland Kitchen diltiazem (CARDIZEM CD) 240 MG 24 hr capsule 1 tab daily      . fenofibrate micronized (ANTARA) 130 MG capsule Take 130 mg by mouth daily.        . IRON PO Take 65 mg elemental calcium/kg/hr by mouth 2 (two) times daily.       Marland Kitchen omeprazole (PRILOSEC) 40 MG capsule Take 1 mg by mouth daily.        Current Facility-Administered Medications  Medication Dose Route Frequency Provider Last Rate Last Dose  . 0.9 %  sodium chloride infusion  500 mL Intravenous Continuous Yancey Flemings, MD         Review of Systems: Unchanged   Physical Exam  Cardiovascular: Normal rate, regular rhythm and normal heart sounds.   Pulmonary/Chest: Effort normal and breath sounds normal.  Abdominal: Soft. Bowel sounds are normal. He exhibits no mass.     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

## 2010-12-31 ENCOUNTER — Ambulatory Visit (HOSPITAL_BASED_OUTPATIENT_CLINIC_OR_DEPARTMENT_OTHER): Payer: Medicare Other | Admitting: Nurse Practitioner

## 2010-12-31 ENCOUNTER — Other Ambulatory Visit: Payer: Self-pay | Admitting: Oncology

## 2010-12-31 ENCOUNTER — Other Ambulatory Visit (HOSPITAL_BASED_OUTPATIENT_CLINIC_OR_DEPARTMENT_OTHER): Payer: Medicare Other

## 2010-12-31 VITALS — BP 115/79 | HR 50 | Temp 97.4°F | Ht 71.0 in | Wt 215.4 lb

## 2010-12-31 DIAGNOSIS — D709 Neutropenia, unspecified: Secondary | ICD-10-CM

## 2010-12-31 DIAGNOSIS — C169 Malignant neoplasm of stomach, unspecified: Secondary | ICD-10-CM

## 2010-12-31 DIAGNOSIS — C159 Malignant neoplasm of esophagus, unspecified: Secondary | ICD-10-CM

## 2010-12-31 DIAGNOSIS — Z5111 Encounter for antineoplastic chemotherapy: Secondary | ICD-10-CM

## 2010-12-31 DIAGNOSIS — C160 Malignant neoplasm of cardia: Secondary | ICD-10-CM

## 2010-12-31 LAB — CBC WITH DIFFERENTIAL/PLATELET
Basophils Absolute: 0 10*3/uL (ref 0.0–0.1)
EOS%: 4.2 % (ref 0.0–7.0)
Eosinophils Absolute: 0.2 10*3/uL (ref 0.0–0.5)
HGB: 14.3 g/dL (ref 13.0–17.1)
LYMPH%: 23.8 % (ref 14.0–49.0)
MCH: 28.6 pg (ref 27.2–33.4)
MCV: 83.3 fL (ref 79.3–98.0)
MONO%: 15.6 % — ABNORMAL HIGH (ref 0.0–14.0)
NEUT#: 2.2 10*3/uL (ref 1.5–6.5)
NEUT%: 56.1 % (ref 39.0–75.0)
Platelets: 239 10*3/uL (ref 140–400)

## 2010-12-31 NOTE — Progress Notes (Signed)
OFFICE PROGRESS NOTE  Interval history:  [name] returns as scheduled. He underwent an upper endoscopy on 12/17/2010. The very distal portion of the esophagus was ulcerated. The proximal stomach revealed a malignant appearing ulcerative mass in the region of the cardia/gastroesophageal junction. The overall bulk of the lesion was significantly improved but still appeared quite abnormal and large (4 cm). Multiple biopsies were taken. Pathology showed ulcerated squamous and columnar/glandular mucosa. Negative for dysplasia or malignancy.  [name] reports that he feels well. He denies dysphagia. He has a good appetite. He is gaining weight. No nausea or vomiting. Bowels moving regularly. He denies fever, cough or shortness of breath.   Objective: Blood pressure 115/79, pulse 50, temperature 97.4 F (36.3 C), temperature source Oral, height 5\' 11"  (1.803 m), weight 215 lb 6.4 oz (97.705 kg).  Oropharynx is without thrush or ulceration. No palpable cervical, supraclavicular or axillary lymph nodes. Breath sounds slightly coarse at the left base. Regular cardiac rhythm. Abdomen soft and nontender. No organomegaly. Extremities are without edema.   Lab Results: Lab Results  Component Value Date   WBC 3.9* 12/31/2010   HGB 14.3 12/31/2010   HCT 41.7 12/31/2010   MCV 83.3 12/31/2010   PLT 239 12/31/2010    Chemistry:      Component Value Date/Time   NA 138 11/03/2010 0804   K 4.3 11/03/2010 0804   CL 106 11/03/2010 0804   CO2 27 11/03/2010 0804   GLUCOSE 112* 11/03/2010 0804   BUN 10 11/03/2010 0804   CREATININE 1.0 11/03/2010 0804   CALCIUM 9.1 11/03/2010 0804   PROT 6.4 11/03/2010 0804   ALBUMIN 3.8 11/03/2010 0804   AST 26 11/03/2010 0804   ALT 22 11/03/2010 0804   ALKPHOS 51 11/03/2010 0804   BILITOT 0.5 11/03/2010 0804   GFRNONAA 70.11 12/22/2009 0951     Studies/Results: No results found.  Medications: I have reviewed the patient's current  medications.  Assessment/Plan:  1. 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  Staging CT scan 08/27/2010 revealed small gastrohepatic ligament, periportal and retrocrural lymph nodes without other evidence of metastatic disease.  Staging PET scan on 09/21/2010 showed abnormal malignant range FDG uptake at the site of the biopsy-proven gastric cancer.  Small perigastric lymph nodes were not FDG avid above the background level.  The radiologist commented that this may reflect a benign etiology or a false-negative appearance due to the small lymph node size.  There was no evidence for distant metastatic disease.  The CEA was markedly elevated at 482 on 09/14/2010.  He completed radiation 09/20/2010 through 10/29/2010.  He received concurrent weekly Taxol/carboplatin chemotherapy 09/22/2010 through 10/27/2010.  Restaging PET scan 11/22/2010 showed interval resolution of previously demonstrated hypermetabolic activity within the proximal stomach.  There was no evidence of metastatic disease. 2. Status post upper endoscopy 12/17/2010-the very distal portion of the esophagus was ulcerated. The proximal stomach revealed a malignant appearing ulcerative mass in the region of the cardia/gastroesophageal junction. The overall bulk of the lesion was significantly improved but still quite abnormal and large. Multiple biopsies were taken. Pathology was negative for malignancy. 3. Elevated CEA (482) on 09/14/2010.  Repeat CEA on 11/25/2010 was normal at 4.7. 4. Dysphagia secondary to the lower esophagus/upper gastric tumor, resolved. 5. Microcytic anemia.  He continues oral iron. Hemoglobin is in normal range on labs today. 6. Sleep apnea. 7. Early diabetes. 8. History of atrial fibrillation. 9. Hyperlipidemia. 10. History  of colon polyps. 11. History of Helicobacter pylori infection in 2011. 12. History of neutropenia  secondary to chemotherapy, resolved.  Disposition-Jon [name] appears well. He was recently seen by Dr. Edwyna Shell. Per Dr. Scheryl Darter note dated 12/29/2010, resection was recommended due to the persistent ulcer at the gastroesophageal junction. Jon [name] has declined surgery. He will return for a followup visit with Dr. Edwyna Shell in 2 months with CT scans of the chest and abdomen. We are scheduling a followup visit with Dr. Truett Perna in 3 months and will obtain a CEA at that time. Jon [name] will contact the office in the interim with any problems.  Plan reviewed with Dr. Truett Perna.    Lonna Cobb ANP/GNP-BC

## 2011-01-06 ENCOUNTER — Other Ambulatory Visit: Payer: Self-pay | Admitting: Internal Medicine

## 2011-01-10 ENCOUNTER — Other Ambulatory Visit: Payer: Self-pay | Admitting: Thoracic Surgery

## 2011-01-10 DIAGNOSIS — C159 Malignant neoplasm of esophagus, unspecified: Secondary | ICD-10-CM

## 2011-02-15 ENCOUNTER — Encounter: Payer: Self-pay | Admitting: Thoracic Surgery

## 2011-02-15 ENCOUNTER — Other Ambulatory Visit: Payer: Self-pay | Admitting: Thoracic Surgery

## 2011-02-15 ENCOUNTER — Ambulatory Visit (INDEPENDENT_AMBULATORY_CARE_PROVIDER_SITE_OTHER): Payer: Medicare Other | Admitting: Thoracic Surgery

## 2011-02-15 ENCOUNTER — Ambulatory Visit
Admission: RE | Admit: 2011-02-15 | Discharge: 2011-02-15 | Disposition: A | Discharge status: Home / Self Care | Payer: Medicare Other | Source: Ambulatory Visit | Attending: Thoracic Surgery | Admitting: Thoracic Surgery

## 2011-02-15 VITALS — BP 135/81 | HR 50 | Resp 18 | Ht 71.0 in | Wt 218.0 lb

## 2011-02-15 DIAGNOSIS — C159 Malignant neoplasm of esophagus, unspecified: Secondary | ICD-10-CM

## 2011-02-15 MED ORDER — IOHEXOL 300 MG/ML  SOLN: 20.0000 mL | Freq: Once | INTRAMUSCULAR | Status: AC | PRN

## 2011-02-15 MED ORDER — IOHEXOL 300 MG/ML  SOLN
125.0000 mL | Freq: Once | INTRAMUSCULAR | Status: AC | PRN
  Administered 2011-02-15: 125 mL via INTRAVENOUS

## 2011-02-15 NOTE — Progress Notes (Signed)
HPI patient returns for followup. CT scan shows no evidence of recurrence of his cancer. He's had no symptoms. To get a gastric ulcer I think this needs to be rescoped in about 3 months. I also will repeat a PET scan at that time. He will see Dr. Truett Perna this next month.   Current Outpatient Prescriptions  Medication Sig Dispense Refill  . ANTARA 130 MG capsule TAKE ONE CAPSULE BY MOUTH EVERY DAY  30 capsule  11  . aspirin 81 MG tablet Take 81 mg by mouth daily.        . Cholecalciferol 1000 UNITS tablet Take 1,000 Units by mouth daily.       Marland Kitchen diltiazem (CARDIZEM CD) 240 MG 24 hr capsule 1 tab daily      . IRON PO Take 65 mg elemental calcium/kg/hr by mouth 2 (two) times daily.       Marland Kitchen omeprazole (PRILOSEC) 40 MG capsule Take 1 mg by mouth daily.        Current Facility-Administered Medications  Medication Dose Route Frequency Provider Last Rate Last Dose  . 0.9 %  sodium chloride infusion  500 mL Intravenous Continuous Yancey Flemings, MD       Facility-Administered Medications Ordered in Other Visits  Medication Dose Route Frequency Provider Last Rate Last Dose  . iohexol (OMNIPAQUE) 300 MG/ML solution 125 mL  125 mL Intravenous Once PRN Medication Radiologist   125 mL at 02/15/11 1036  . iohexol (OMNIPAQUE) 300 MG/ML solution 20 mL  20 mL Oral Once PRN Medication Radiologist         Review of Systems: No symptoms   Physical Exam lungs are clear to auscultation percussion   Diagnostic Tests: CT scan showed no evidence of recurrence of his esophageal cancer   Impression: Esophageal cancer adenocarcinoma status post chemotherapy and radiation surgery refused   Plan return in 3 months with a PET scan consider endoscopy:

## 2011-02-16 ENCOUNTER — Other Ambulatory Visit: Payer: Self-pay | Admitting: *Deleted

## 2011-02-16 MED ORDER — OMEPRAZOLE 40 MG PO CPDR: 1.0000 mg | DELAYED_RELEASE_CAPSULE | Freq: Every day | ORAL | Status: DC

## 2011-03-01 ENCOUNTER — Encounter: Payer: Self-pay | Admitting: *Deleted

## 2011-03-01 DIAGNOSIS — C160 Malignant neoplasm of cardia: Secondary | ICD-10-CM | POA: Insufficient documentation

## 2011-03-01 DIAGNOSIS — Z9221 Personal history of antineoplastic chemotherapy: Secondary | ICD-10-CM | POA: Insufficient documentation

## 2011-03-01 DIAGNOSIS — J700 Acute pulmonary manifestations due to radiation: Secondary | ICD-10-CM | POA: Insufficient documentation

## 2011-03-04 ENCOUNTER — Ambulatory Visit
Admission: RE | Admit: 2011-03-04 | Discharge: 2011-03-04 | Disposition: A | Discharge status: Home / Self Care | Payer: Medicare Other | Source: Ambulatory Visit | Attending: Radiation Oncology | Admitting: Radiation Oncology

## 2011-03-04 ENCOUNTER — Encounter: Payer: Self-pay | Admitting: Radiation Oncology

## 2011-03-04 VITALS — BP 131/82 | HR 54 | Temp 97.9°F | Resp 20 | Ht 71.0 in | Wt 225.6 lb

## 2011-03-04 DIAGNOSIS — C159 Malignant neoplasm of esophagus, unspecified: Secondary | ICD-10-CM

## 2011-03-04 NOTE — Progress Notes (Signed)
Pt denies fatigue,  nausea, pain, diff w/eating or bm's. States he has been doing well.

## 2011-03-04 NOTE — Progress Notes (Signed)
CC:   Jon [name], M.D. Jon [name], M.D. Jon [name]. Jon Goodell, MD Jon Quint Plotnikov, MD  DIAGNOSIS:  Adenocarcinoma of the GE junction/gastric cardia.  INTERVAL HISTORY:  Jon [name] returns to clinic today for followup. He previously completed his course of chemo radiotherapy and his last radiation date was 10/29/2010.  The patient today states that he is doing very well clinically.  He denies any abdominal pain or distant pain elsewhere.  Likewise, he states his appetite is good, his energy level is quite good and he denies any other GI issues such as nausea or vomiting.  The patient underwent an upper endoscopy on 12/17/2010.  This showed some ulceration in the esophageal region.  There still was a malignant appearing ulcerative mass but this had significantly improved but still remained reasonably bulky at 4 cm.  Multiple biopsies were taken and these were all negative for dysplasia or malignancy.  Jon [name] has decided against resection at this time and he is in followup mode currently with repeat scans planned including it is my understanding a repeat PET scan.  PHYSICAL EXAMINATION:  Vital signs:  Weight 225 pounds.  Blood pressure 131/82, pulse 54, respiratory rate 20, temperature 97.9.  General:  Well- developed male in no acute distress.  Neck:  Supple without any lymphadenopathy.  Cardiovascular:  Regular rate and rhythm. Respiratory:  Clear to auscultation bilaterally.  GI:  Abdomen soft, nontender, normal bowel sounds.  Extremities:  No edema present.  IMPRESSION AND PLAN:  Jon [name] is doing well clinically at this time.  He relates no symptoms today relevant to his diagnosis of cancer. I am pleased with his response and the negative biopsies, although the patient does have a residual mass which certainly remains worrisome for possible recurrence or progression at this site.  I look forward to seeing the results of his upcoming scans and these may  provide some further information and guidance with respect to the appropriate course for him whether surgery would be recommended at some point.  I am going to have Jon [name] return to our clinic in 6 months for continued followup.  I spent 10 minutes with Jon [name] today, the majority of which was spent counseling him on his diagnosis of cancer and coordinating his care.    ______________________________ Radene Gunning, M.D., Ph.D. JSM/MEDQ  D:  03/04/2011  T:  03/04/2011  Job:  [id number]

## 2011-03-15 ENCOUNTER — Ambulatory Visit (INDEPENDENT_AMBULATORY_CARE_PROVIDER_SITE_OTHER): Payer: Medicare Other | Admitting: Internal Medicine

## 2011-03-15 ENCOUNTER — Encounter: Payer: Self-pay | Admitting: Internal Medicine

## 2011-03-15 VITALS — BP 118/70 | HR 64 | Temp 97.3°F | Resp 16 | Wt 223.0 lb

## 2011-03-15 DIAGNOSIS — C169 Malignant neoplasm of stomach, unspecified: Secondary | ICD-10-CM

## 2011-03-15 DIAGNOSIS — Z0000 Encounter for general adult medical examination without abnormal findings: Secondary | ICD-10-CM

## 2011-03-15 DIAGNOSIS — Z8679 Personal history of other diseases of the circulatory system: Secondary | ICD-10-CM

## 2011-03-15 DIAGNOSIS — K219 Gastro-esophageal reflux disease without esophagitis: Secondary | ICD-10-CM

## 2011-03-15 DIAGNOSIS — Z23 Encounter for immunization: Secondary | ICD-10-CM

## 2011-03-15 DIAGNOSIS — N320 Bladder-neck obstruction: Secondary | ICD-10-CM

## 2011-03-15 DIAGNOSIS — E785 Hyperlipidemia, unspecified: Secondary | ICD-10-CM

## 2011-03-15 NOTE — Progress Notes (Signed)
Patient ID: Jon [name], male   DOB: 04-29-1945, 66 y.o.   MRN: [id number]  Subjective:    Patient ID: Jon [name], male    DOB: 1945-11-02, 66 y.o.   MRN: [id number]  HPI  The patient presents for a follow-up of  chronic hypertension, chronic dyslipidemia controlled with medicines. 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 No wt loss per pt - good appetite  Wt Readings from Last 3 Encounters:  03/15/11 223 lb (101.152 kg)  03/04/11 225 lb 9.6 oz (102.331 kg)  02/15/11 218 lb (98.884 kg)     Review of Systems  Constitutional: Negative for appetite change, fatigue and unexpected weight change.  HENT: Negative for nosebleeds, congestion, sore throat, sneezing, trouble swallowing and neck pain.   Eyes: Negative for itching and visual disturbance.  Respiratory: Negative for cough.   Cardiovascular: Negative for chest pain, palpitations and leg swelling.  Gastrointestinal: Negative for nausea, diarrhea, blood in stool and abdominal distention.  Genitourinary: Negative for frequency and hematuria.  Musculoskeletal: Negative for back pain, joint swelling and gait problem.  Skin: Negative for rash.  Neurological: Negative for dizziness, tremors, speech difficulty and weakness.  Psychiatric/Behavioral: Negative for sleep disturbance, dysphoric mood and agitation. The patient is not nervous/anxious.    Wt Readings from Last 3 Encounters:  03/15/11 223 lb (101.152 kg)  03/04/11 225 lb 9.6 oz (102.331 kg)  02/15/11 218 lb (98.884 kg)       Objective:   Physical Exam  Constitutional: He is oriented to person, place, and time. He appears well-developed.  HENT:  Mouth/Throat: Oropharynx is clear and moist.  Eyes: Conjunctivae are normal. Pupils are equal, round, and reactive to light.  Neck: Normal range of motion. No JVD present. No thyromegaly present.  Cardiovascular: Normal rate, regular rhythm, normal heart sounds and intact distal pulses.  Exam reveals no  gallop and no friction rub.   No murmur heard. Pulmonary/Chest: Effort normal and breath sounds normal. No respiratory distress. He has no wheezes. He has no rales. He exhibits no tenderness.  Abdominal: Soft. Bowel sounds are normal. He exhibits no distension and no mass. There is no tenderness. There is no rebound and no guarding.  Musculoskeletal: Normal range of motion. He exhibits no edema and no tenderness.  Lymphadenopathy:    He has no cervical adenopathy.  Neurological: He is alert and oriented to person, place, and time. He has normal reflexes. No cranial nerve deficit. He exhibits normal muscle tone. Coordination normal.  Skin: Skin is warm and dry. No rash noted.  Psychiatric: He has a normal mood and affect. His behavior is normal. Judgment and thought content normal.     Labs ok  CT 1/13 CHEST  Findings: On the lung window images there is parenchymal opacity  within the posterior medial left lower lobe with air bronchograms.  This most likely represents pneumonia. If the patient has  undergone radiation, then this could represent radiation fibrosis.  The right lung is clear. No suspicious lung nodule is seen. No  pleural effusion is noted.  On soft tissue window images, there is a low attenuation nodule in  the lower pole of the right lobe of thyroid posterior medially of  approximately 16 mm in diameter. Ultrasound of the thyroid could  be performed if necessary clinically. The thoracic aorta opacifies  with no complicating features and the origins of the great vessels  are patent. There are coronary artery calcifications noted. The  pulmonary arteries opacify  less well, but no significant  abnormality is seen. No mediastinal or hilar adenopathy is noted.  There is very minimal soft tissue prominence at the  gastroesophageal junction which is of questionable significance.  No definite mass is seen.  IMPRESSION:  1. Very minimal soft tissue prominence at the  gastroesophageal  junction. No definite mass.  2. Parenchymal opacity in the left lower lobe. Probable  pneumonia. Correlate clinically.  3. Right lower lobe thyroid nodule of 16 mm in diameter.  4. Coronary artery calcifications.  CT ABDOMEN  Findings: Somewhat prominent soft tissue is again noted at the  gastroesophageal junction, but on sagittal and coronal images is  less impressive than on axial scans. No definite gastroesophageal  mass is seen. The liver enhances with minimal inhomogeneity and no  focal abnormality. No ductal dilatation is seen. No calcified  gallstones are seen. The pancreas is normal in size and the  pancreatic duct is not dilated. The adrenal glands and spleen are  unremarkable. The kidneys enhance and multiple right renal cysts  again are noted with a left lower pole renal cyst stable as well.  The abdominal aorta is normal in caliber. No adenopathy is seen.  The origins of the celiac axis, SMA, renal arteries and IMA are  patent. No bony abnormality is seen.  IMPRESSION:  1. No definite gastroesophageal mass is evident on sagittal and  coronal images.  2. Stable renal cysts.  Original Report Authenticated By: Juline Patch, M.D.        Assessment & Plan:

## 2011-03-15 NOTE — Assessment & Plan Note (Signed)
Doing well  - cont Rx 

## 2011-03-15 NOTE — Assessment & Plan Note (Signed)
  On diet  

## 2011-03-15 NOTE — Assessment & Plan Note (Signed)
Continue with current prescription therapy as reflected on the Med list.  

## 2011-03-15 NOTE — Assessment & Plan Note (Signed)
CT CHEST 1/13 Findings: On the lung window images there is parenchymal opacity  within the posterior medial left lower lobe with air bronchograms.  This most likely represents pneumonia. If the patient has  undergone radiation, then this could represent radiation fibrosis.  The right lung is clear. No suspicious lung nodule is seen. No  pleural effusion is noted.  On soft tissue window images, there is a low attenuation nodule in  the lower pole of the right lobe of thyroid posterior medially of  approximately 16 mm in diameter. Ultrasound of the thyroid could  be performed if necessary clinically. The thoracic aorta opacifies  with no complicating features and the origins of the great vessels  are patent. There are coronary artery calcifications noted. The  pulmonary arteries opacify less well, but no significant  abnormality is seen. No mediastinal or hilar adenopathy is noted.  There is very minimal soft tissue prominence at the  gastroesophageal junction which is of questionable significance.  No definite mass is seen.  IMPRESSION:  1. Very minimal soft tissue prominence at the gastroesophageal  junction. No definite mass.  2. Parenchymal opacity in the left lower lobe. Probable  pneumonia. Correlate clinically.  3. Right lower lobe thyroid nodule of 16 mm in diameter.  4. Coronary artery calcifications.  CT ABDOMEN  Findings: Somewhat prominent soft tissue is again noted at the  gastroesophageal junction, but on sagittal and coronal images is  less impressive than on axial scans. No definite gastroesophageal  mass is seen. The liver enhances with minimal inhomogeneity and no  focal abnormality. No ductal dilatation is seen. No calcified  gallstones are seen. The pancreas is normal in size and the  pancreatic duct is not dilated. The adrenal glands and spleen are  unremarkable. The kidneys enhance and multiple right renal cysts  again are noted with a left lower pole renal cyst  stable as well.  The abdominal aorta is normal in caliber. No adenopathy is seen.  The origins of the celiac axis, SMA, renal arteries and IMA are  patent. No bony abnormality is seen.  IMPRESSION:  1. No definite gastroesophageal mass is evident on sagittal and  coronal images.  2. Stable renal cysts.  Original Report Authenticated By: Juline Patch, M.D.

## 2011-03-31 ENCOUNTER — Ambulatory Visit (HOSPITAL_BASED_OUTPATIENT_CLINIC_OR_DEPARTMENT_OTHER): Payer: Medicare Other | Admitting: Oncology

## 2011-03-31 ENCOUNTER — Other Ambulatory Visit: Payer: Medicare Other | Admitting: Lab

## 2011-03-31 ENCOUNTER — Telehealth: Payer: Self-pay | Admitting: Oncology

## 2011-03-31 VITALS — BP 129/84 | HR 52 | Temp 97.6°F | Ht 71.0 in | Wt 221.4 lb

## 2011-03-31 DIAGNOSIS — C160 Malignant neoplasm of cardia: Secondary | ICD-10-CM

## 2011-03-31 DIAGNOSIS — C169 Malignant neoplasm of stomach, unspecified: Secondary | ICD-10-CM

## 2011-03-31 DIAGNOSIS — C159 Malignant neoplasm of esophagus, unspecified: Secondary | ICD-10-CM

## 2011-03-31 LAB — CBC WITH DIFFERENTIAL/PLATELET
Basophils Absolute: 0 10*3/uL (ref 0.0–0.1)
Eosinophils Absolute: 0.2 10*3/uL (ref 0.0–0.5)
HCT: 42.8 % (ref 38.4–49.9)
HGB: 15.2 g/dL (ref 13.0–17.1)
MCV: 80 fL (ref 79.3–98.0)
NEUT#: 2.3 10*3/uL (ref 1.5–6.5)
NEUT%: 53 % (ref 39.0–75.0)
RDW: 14.3 % (ref 11.0–14.6)
lymph#: 1.1 10*3/uL (ref 0.9–3.3)

## 2011-03-31 LAB — CEA: CEA: 1.5 ng/mL (ref 0.0–5.0)

## 2011-03-31 NOTE — Progress Notes (Signed)
OFFICE PROGRESS NOTE   INTERVAL HISTORY:   He returns as scheduled. He denies dysphagia. He denies fever, cough, and shortness of breath. A restaging CT of the chest and abdomen on 02/15/2011 revealed a parenchymal opacity in the posterior left lower lobe with air bronchograms. This was felt to represent pneumonia or radiation change. No suspicious lung nodules. No pleural effusion. Very minimal soft tissue prominence at the GE junction. No focal abnormality of the liver. No adenopathy.  Objective:  Vital signs in last 24 hours:  Blood pressure 129/84, pulse 52, temperature 97.6 F (36.4 C), temperature source Oral, height 5\' 11"  (1.803 m), weight 221 lb 6.4 oz (100.426 kg).    HEENT: Neck without mass Lymphatics: No cervical, supraclavicular, or axillary nodes Resp: Inspiratory rhonchi at the left lower posterior chest. No respiratory distress Cardio: Regular rate and rhythm GI: No hepatomegaly, no mass, nontender Vascular:  No leg edema   Lab Results:  Lab Results  Component Value Date   WBC 4.4 03/31/2011   HGB 15.2 03/31/2011   HCT 42.8 03/31/2011   MCV 80.0 03/31/2011   PLT 185 03/31/2011   CEA pending    Medications: I have reviewed the patient's current medications.  Assessment/Plan: 1. 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 Staging CT scan 08/27/2010 revealed small gastrohepatic ligament, periportal and retrocrural lymph nodes without other evidence of metastatic disease. Staging PET scan on 09/21/2010 showed abnormal malignant range FDG uptake at the site of the biopsy-proven gastric cancer. Small perigastric lymph nodes were not FDG avid above the background level. The radiologist commented that this may reflect a benign etiology or a false-negative appearance due to the small lymph node size. There was no evidence for distant metastatic disease. The CEA was markedly elevated at  482 on 09/14/2010. He completed radiation 09/20/2010 through 10/29/2010. He received concurrent weekly Taxol/carboplatin chemotherapy 09/22/2010 through 10/27/2010. Restaging PET scan 11/22/2010 showed interval resolution of previously demonstrated hypermetabolic activity within the proximal stomach. There was no evidence of metastatic disease. 2. Status post upper endoscopy 12/17/2010-the very distal portion of the esophagus was ulcerated. The proximal stomach revealed a malignant appearing ulcerative mass in the region of the cardia/gastroesophageal junction. The overall bulk of the lesion was significantly improved but still quite abnormal and large. Multiple biopsies were taken. Pathology was negative for malignancy. 3. Elevated CEA (482) on 09/14/2010. Repeat CEA on 11/25/2010 was normal at 4.7. 4. Dysphagia secondary to the lower esophagus/upper gastric tumor, resolved. 5. Microcytic anemia. The hemoglobin is now normal. 6. Sleep apnea. 7. Early diabetes. 8. History of atrial fibrillation. 9. Hyperlipidemia. 10. History of colon polyps. 11. History of Helicobacter pylori infection in 2011. 12. History of neutropenia secondary to chemotherapy, resolved. 13. CT of the chest on 02/15/2011 with left lower lung density/air bronchograms-? Radiation change.   Disposition:  He remains in clinical remission from the esophagus cancer. We will followup on the CEA from today. He will return for an office visit and CEA in 3 months. I suspect the left lower lung changes are related to radiation. He will contact us if he develops respiratory symptoms .   Lucile Shutters, MD  03/31/2011  1:27 PM

## 2011-03-31 NOTE — Telephone Encounter (Signed)
Gv pt appt for may2013 

## 2011-03-31 NOTE — Telephone Encounter (Signed)
I called pt and told him Dr Truett Perna wants him to resume his FeSo4 daily for low MCV. He voices understanding

## 2011-04-08 ENCOUNTER — Other Ambulatory Visit: Payer: Self-pay | Admitting: Thoracic Surgery

## 2011-04-08 DIAGNOSIS — C159 Malignant neoplasm of esophagus, unspecified: Secondary | ICD-10-CM

## 2011-05-05 ENCOUNTER — Other Ambulatory Visit (HOSPITAL_COMMUNITY): Payer: Medicare Other

## 2011-05-10 ENCOUNTER — Encounter (HOSPITAL_COMMUNITY)
Admission: RE | Admit: 2011-05-10 | Discharge: 2011-05-10 | Disposition: A | Discharge status: Home / Self Care | Payer: Medicare Other | Source: Ambulatory Visit | Attending: Thoracic Surgery | Admitting: Thoracic Surgery

## 2011-05-10 DIAGNOSIS — C159 Malignant neoplasm of esophagus, unspecified: Secondary | ICD-10-CM | POA: Insufficient documentation

## 2011-05-10 DIAGNOSIS — N281 Cyst of kidney, acquired: Secondary | ICD-10-CM | POA: Insufficient documentation

## 2011-05-10 MED ORDER — FLUDEOXYGLUCOSE F - 18 (FDG) INJECTION
17.9000 | Freq: Once | INTRAVENOUS | Status: AC | PRN
  Administered 2011-05-10: 17.9 via INTRAVENOUS

## 2011-05-18 ENCOUNTER — Encounter: Payer: Self-pay | Admitting: Thoracic Surgery

## 2011-05-18 ENCOUNTER — Ambulatory Visit (INDEPENDENT_AMBULATORY_CARE_PROVIDER_SITE_OTHER): Payer: Medicare Other | Admitting: Thoracic Surgery

## 2011-05-18 VITALS — BP 136/86 | HR 60 | Resp 18 | Ht 71.0 in | Wt 224.0 lb

## 2011-05-18 DIAGNOSIS — C159 Malignant neoplasm of esophagus, unspecified: Secondary | ICD-10-CM

## 2011-05-18 NOTE — Progress Notes (Signed)
HPI the patient returns for followup. He underwent radiation and chemotherapy for his esophageal cancer. PET scan today showed no evidence of any activity now 3 months since his last PET scan. He is swallowing without difficulty. He will be seen by Dr. Truett Perna in the near future. I have called Lonna Cobb to arrange followup with Dr. Truett Perna in the future. If he has any recurrence of his cancer he can be referred back to Dr. Tyrone Sage the   Current Outpatient Prescriptions  Medication Sig Dispense Refill  . ANTARA 130 MG capsule TAKE ONE CAPSULE BY MOUTH EVERY DAY  30 capsule  11  . aspirin 81 MG tablet Take 81 mg by mouth daily.        . Cholecalciferol 1000 UNITS tablet Take 1,000 Units by mouth daily.       Marland Kitchen diltiazem (CARDIZEM CD) 240 MG 24 hr capsule 1 tab daily      . omeprazole (PRILOSEC) 40 MG capsule Take 1 capsule (40 mg total) by mouth daily.  30 capsule  5     Review of Systems: Unchanged   Physical Exam lungs are clear to auscultation percussion   Diagnostic Tests: PET scan showed no evidence of recurrence of his cancer   Impression: Esophageal cancer status post radiation and chemotherapy   Plan: Followup by Dr. Truett Perna

## 2011-06-29 ENCOUNTER — Telehealth: Payer: Self-pay | Admitting: Oncology

## 2011-06-29 ENCOUNTER — Ambulatory Visit (HOSPITAL_BASED_OUTPATIENT_CLINIC_OR_DEPARTMENT_OTHER): Payer: Medicare Other | Admitting: Nurse Practitioner

## 2011-06-29 ENCOUNTER — Other Ambulatory Visit (HOSPITAL_BASED_OUTPATIENT_CLINIC_OR_DEPARTMENT_OTHER): Payer: Medicare Other | Admitting: Lab

## 2011-06-29 ENCOUNTER — Ambulatory Visit: Payer: Medicare Other

## 2011-06-29 VITALS — BP 126/80 | HR 53 | Temp 98.3°F | Ht 71.0 in | Wt 218.7 lb

## 2011-06-29 DIAGNOSIS — C169 Malignant neoplasm of stomach, unspecified: Secondary | ICD-10-CM

## 2011-06-29 DIAGNOSIS — C159 Malignant neoplasm of esophagus, unspecified: Secondary | ICD-10-CM

## 2011-06-29 DIAGNOSIS — R718 Other abnormality of red blood cells: Secondary | ICD-10-CM

## 2011-06-29 LAB — CBC WITH DIFFERENTIAL/PLATELET
BASO%: 0.3 % (ref 0.0–2.0)
Basophils Absolute: 0 10*3/uL (ref 0.0–0.1)
EOS%: 4 % (ref 0.0–7.0)
HCT: 42.7 % (ref 38.4–49.9)
HGB: 14.5 g/dL (ref 13.0–17.1)
LYMPH%: 21 % (ref 14.0–49.0)
MCH: 28.8 pg (ref 27.2–33.4)
MCHC: 34 g/dL (ref 32.0–36.0)
NEUT%: 62.6 % (ref 39.0–75.0)
Platelets: 230 10*3/uL (ref 140–400)

## 2011-06-29 NOTE — Progress Notes (Signed)
OFFICE PROGRESS NOTE  Interval history:  Jon [name] returns as scheduled. He feels well. He has a good appetite. He is gaining weight. He denies dysphagia. No nausea or vomiting. No reflux symptoms. He denies fever, cough and shortness of breath. No bowel or bladder problems.  He discontinued oral iron after approximately 1 month due to "gas".   Objective: Blood pressure 126/80, pulse 53, temperature 98.3 F (36.8 C), temperature source Oral, height 5\' 11"  (1.803 m), weight 218 lb 11.2 oz (99.202 kg).  Oropharynx is without thrush or ulceration. No palpable cervical, supraclavicular or axillary lymph nodes. Inspiratory rales at the left lung base. No respiratory distress. Regular cardiac rhythm. Abdomen soft and nontender. No hepatomegaly. Extremities are without edema.  Lab Results: Lab Results  Component Value Date   WBC 4.4 03/31/2011   HGB 15.2 03/31/2011   HCT 42.8 03/31/2011   MCV 80.0 03/31/2011   PLT 185 03/31/2011    Chemistry:    Chemistry      Component Value Date/Time   NA 138 11/03/2010 0804   K 4.3 11/03/2010 0804   CL 106 11/03/2010 0804   CO2 27 11/03/2010 0804   BUN 15 02/15/2011 0846   CREATININE 1.20 02/15/2011 0846   CREATININE 1.0 11/03/2010 0804      Component Value Date/Time   CALCIUM 9.1 11/03/2010 0804   ALKPHOS 51 11/03/2010 0804   AST 26 11/03/2010 0804   ALT 22 11/03/2010 0804   BILITOT 0.5 11/03/2010 0804       Studies/Results: No results found.  Medications: I have reviewed the patient's current medications.  Assessment/Plan:  1. 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 Staging CT scan 08/27/2010 revealed small gastrohepatic ligament, periportal and retrocrural lymph nodes without other evidence of metastatic disease. Staging PET scan on 09/21/2010 showed abnormal malignant range FDG uptake at the site of the biopsy-proven gastric cancer. Small perigastric  lymph nodes were not FDG avid above the background level. The radiologist commented that this may reflect a benign etiology or a false-negative appearance due to the small lymph node size. There was no evidence for distant metastatic disease. The CEA was markedly elevated at 482 on 09/14/2010. He completed radiation 09/20/2010 through 10/29/2010. He received concurrent weekly Taxol/carboplatin chemotherapy 09/22/2010 through 10/27/2010. Restaging PET scan 11/22/2010 showed interval resolution of previously demonstrated hypermetabolic activity within the proximal stomach. There was no evidence of metastatic disease. Restaging PET scan 05/10/2011 showed no evidence of gastroesophageal carcinoma recurrence or metastasis. 2. Status post upper endoscopy 12/17/2010-the very distal portion of the esophagus was ulcerated. The proximal stomach revealed a malignant appearing ulcerative mass in the region of the cardia/gastroesophageal junction. The overall bulk of the lesion was significantly improved but still quite abnormal and large. Multiple biopsies were taken. Pathology was negative for malignancy. 3. Elevated CEA (482) on 09/14/2010. Repeat CEA on 11/25/2010 was normal at 4.7. CEA on 03/31/2011 normal at 1.5. 4. Dysphagia secondary to the lower esophagus/upper gastric tumor, resolved. 5. Microcytic anemia. The hemoglobin was normal on 03/31/2011. There was persistent red cell microcytosis. He was unable to tolerate oral iron. We are obtaining a repeat CBC today. 6. Sleep apnea. 7. Early diabetes. 8. History of atrial fibrillation. 9. Hyperlipidemia. 10. History of colon polyps. 11. History of Helicobacter pylori infection in 2011. 12. History of neutropenia secondary to chemotherapy, resolved. 13. CT of the chest on 02/15/2011 with left lower lung density/air bronchograms-? Radiation change.  Disposition-Mr.  [name] appears well. He remains in remission from the esophagus cancer. We will followup on  the CEA and CBC from today. He will return for a followup visit in 3 months. He will contact the office in the interim with any problems.  Plan reviewed with Dr. Truett Perna.  Lonna Cobb ANP/GNP-BC

## 2011-06-29 NOTE — Telephone Encounter (Signed)
appt made and printed for pt aom °

## 2011-07-01 ENCOUNTER — Other Ambulatory Visit: Payer: Self-pay | Admitting: Internal Medicine

## 2011-07-04 ENCOUNTER — Telehealth: Payer: Self-pay | Admitting: *Deleted

## 2011-07-04 NOTE — Telephone Encounter (Signed)
Called pt with CEA results. He understands to follow up as scheduled.

## 2011-07-04 NOTE — Telephone Encounter (Signed)
Message copied by Caleb Popp on Mon Jul 04, 2011 10:17 AM ------      Message from: Ladene Artist      Created: Sat Jul 02, 2011  9:35 AM       Please call patient, cea is normal, f/u as scheduled

## 2011-07-06 ENCOUNTER — Other Ambulatory Visit (INDEPENDENT_AMBULATORY_CARE_PROVIDER_SITE_OTHER): Payer: Medicare Other

## 2011-07-06 DIAGNOSIS — Z0000 Encounter for general adult medical examination without abnormal findings: Secondary | ICD-10-CM

## 2011-07-06 DIAGNOSIS — E785 Hyperlipidemia, unspecified: Secondary | ICD-10-CM

## 2011-07-06 DIAGNOSIS — Z125 Encounter for screening for malignant neoplasm of prostate: Secondary | ICD-10-CM

## 2011-07-06 DIAGNOSIS — C169 Malignant neoplasm of stomach, unspecified: Secondary | ICD-10-CM

## 2011-07-06 DIAGNOSIS — Z79899 Other long term (current) drug therapy: Secondary | ICD-10-CM

## 2011-07-06 DIAGNOSIS — Z8679 Personal history of other diseases of the circulatory system: Secondary | ICD-10-CM

## 2011-07-06 DIAGNOSIS — Z23 Encounter for immunization: Secondary | ICD-10-CM

## 2011-07-06 DIAGNOSIS — N320 Bladder-neck obstruction: Secondary | ICD-10-CM

## 2011-07-06 LAB — HEPATIC FUNCTION PANEL
AST: 25 U/L (ref 0–37)
Albumin: 4.3 g/dL (ref 3.5–5.2)
Alkaline Phosphatase: 78 U/L (ref 39–117)
Bilirubin, Direct: 0.1 mg/dL (ref 0.0–0.3)

## 2011-07-06 LAB — URINALYSIS
Hgb urine dipstick: NEGATIVE
Nitrite: NEGATIVE
Total Protein, Urine: NEGATIVE
Urine Glucose: NEGATIVE
pH: 6.5 (ref 5.0–8.0)

## 2011-07-06 LAB — BASIC METABOLIC PANEL
BUN: 19 mg/dL (ref 6–23)
Chloride: 105 mEq/L (ref 96–112)
GFR: 69.77 mL/min (ref >60.00)
Potassium: 4.2 mEq/L (ref 3.5–5.1)
Sodium: 139 mEq/L (ref 135–145)

## 2011-07-06 LAB — CBC WITH DIFFERENTIAL/PLATELET
Basophils Relative: 0.3 % (ref 0.0–3.0)
Eosinophils Relative: 3.9 % (ref 0.0–5.0)
HCT: 44.1 % (ref 39.0–52.0)
Hemoglobin: 14.9 g/dL (ref 13.0–17.0)
Lymphs Abs: 1.1 10*3/uL (ref 0.7–4.0)
MCV: 86.6 fl (ref 78.0–100.0)
Monocytes Absolute: 0.6 10*3/uL (ref 0.1–1.0)
Monocytes Relative: 12.8 % — ABNORMAL HIGH (ref 3.0–12.0)
Neutro Abs: 2.7 10*3/uL (ref 1.4–7.7)
Platelets: 218 10*3/uL (ref 150.0–400.0)
RBC: 5.09 Mil/uL (ref 4.22–5.81)
WBC: 4.6 10*3/uL (ref 4.5–10.5)

## 2011-07-06 LAB — PSA: PSA: 0.92 ng/mL (ref 0.10–4.00)

## 2011-07-06 LAB — LIPID PANEL
HDL: 39.6 mg/dL (ref >39.00)
Total CHOL/HDL Ratio: 4
VLDL: 52.6 mg/dL — ABNORMAL HIGH (ref 0.0–40.0)

## 2011-07-06 LAB — TSH: TSH: 0.66 u[IU]/mL (ref 0.35–5.50)

## 2011-07-13 ENCOUNTER — Ambulatory Visit (INDEPENDENT_AMBULATORY_CARE_PROVIDER_SITE_OTHER): Payer: Medicare Other | Admitting: Internal Medicine

## 2011-07-13 ENCOUNTER — Encounter: Payer: Self-pay | Admitting: Internal Medicine

## 2011-07-13 VITALS — BP 114/68 | HR 52 | Temp 97.2°F | Resp 16 | Wt 221.2 lb

## 2011-07-13 DIAGNOSIS — I10 Essential (primary) hypertension: Secondary | ICD-10-CM

## 2011-07-13 DIAGNOSIS — E785 Hyperlipidemia, unspecified: Secondary | ICD-10-CM

## 2011-07-13 DIAGNOSIS — IMO0002 Reserved for concepts with insufficient information to code with codable children: Secondary | ICD-10-CM

## 2011-07-13 DIAGNOSIS — Z0000 Encounter for general adult medical examination without abnormal findings: Secondary | ICD-10-CM

## 2011-07-13 DIAGNOSIS — C169 Malignant neoplasm of stomach, unspecified: Secondary | ICD-10-CM

## 2011-07-13 DIAGNOSIS — Z8679 Personal history of other diseases of the circulatory system: Secondary | ICD-10-CM

## 2011-07-13 DIAGNOSIS — IMO0001 Reserved for inherently not codable concepts without codable children: Secondary | ICD-10-CM

## 2011-07-13 DIAGNOSIS — E1165 Type 2 diabetes mellitus with hyperglycemia: Secondary | ICD-10-CM

## 2011-07-13 DIAGNOSIS — D485 Neoplasm of uncertain behavior of skin: Secondary | ICD-10-CM

## 2011-07-13 MED ORDER — METFORMIN HCL 500 MG PO TABS: 500.0000 mg | ORAL_TABLET | Freq: Every day | ORAL | Status: DC

## 2011-07-13 NOTE — Assessment & Plan Note (Signed)
Continue with current prescription therapy as reflected on the Med list.  

## 2011-07-13 NOTE — Assessment & Plan Note (Signed)
Per oncology °

## 2011-07-13 NOTE — Assessment & Plan Note (Signed)

## 2011-07-13 NOTE — Progress Notes (Signed)
Subjective:    Patient ID: Jon [name], male    DOB: 1945/11/26, 66 y.o.   MRN: [id number]  HPI  The patient is here for a wellness exam.  The patient presents for a follow-up of  chronic hypertension, chronic dyslipidemia controlled with medicines. 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 No wt loss per pt - good appetite  Wt Readings from Last 3 Encounters:  07/13/11 221 lb 4 oz (100.358 kg)  06/29/11 218 lb 11.2 oz (99.202 kg)  05/18/11 224 lb (101.606 kg)   BP Readings from Last 3 Encounters:  07/13/11 114/68  06/29/11 126/80  05/18/11 136/86     Review of Systems  Constitutional: Negative for appetite change, fatigue and unexpected weight change.  HENT: Negative for nosebleeds, congestion, sore throat, sneezing, trouble swallowing and neck pain.   Eyes: Negative for itching and visual disturbance.  Respiratory: Negative for cough.   Cardiovascular: Negative for chest pain, palpitations and leg swelling.  Gastrointestinal: Negative for nausea, diarrhea, blood in stool and abdominal distention.  Genitourinary: Negative for frequency and hematuria.  Musculoskeletal: Negative for back pain, joint swelling and gait problem.  Skin: Negative for rash.  Neurological: Negative for dizziness, tremors, speech difficulty and weakness.  Psychiatric/Behavioral: Negative for disturbed wake/sleep cycle, dysphoric mood and agitation. The patient is not nervous/anxious.         Objective:   Physical Exam  Constitutional: He is oriented to person, place, and time. He appears well-developed.  HENT:  Mouth/Throat: Oropharynx is clear and moist.  Eyes: Conjunctivae are normal. Pupils are equal, round, and reactive to light.  Neck: Normal range of motion. No JVD present. No thyromegaly present.  Cardiovascular: Normal rate, regular rhythm, normal heart sounds and intact distal pulses.  Exam reveals no gallop and no friction rub.   No murmur heard. Pulmonary/Chest:  Effort normal and breath sounds normal. No respiratory distress. He has no wheezes. He has no rales. He exhibits no tenderness.  Abdominal: Soft. Bowel sounds are normal. He exhibits no distension and no mass. There is no tenderness. There is no rebound and no guarding.  Musculoskeletal: Normal range of motion. He exhibits no edema and no tenderness.  Lymphadenopathy:    He has no cervical adenopathy.  Neurological: He is alert and oriented to person, place, and time. He has normal reflexes. No cranial nerve deficit. He exhibits normal muscle tone. Coordination normal.  Skin: Skin is warm and dry. No rash noted.  Psychiatric: He has a normal mood and affect. His behavior is normal. Judgment and thought content normal.  Growth on R thigh R shoulder is tender w/ROM  Lab Results  Component Value Date   WBC 4.6 07/06/2011   HGB 14.9 07/06/2011   HCT 44.1 07/06/2011   PLT 218.0 07/06/2011   GLUCOSE 177* 07/06/2011   CHOL 140 07/06/2011   TRIG 263.0* 07/06/2011   HDL 39.60 07/06/2011   LDLDIRECT 50.9 07/06/2011   LDLCALC 63 12/22/2009   ALT 27 07/06/2011   AST 25 07/06/2011   NA 139 07/06/2011   K 4.2 07/06/2011   CL 105 07/06/2011   CREATININE 1.3 07/06/2011   BUN 19 07/06/2011   CO2 27 07/06/2011   TSH 0.66 07/06/2011   PSA 0.92 07/06/2011   HGBA1C 7.8* 11/03/2010       CT 1/13 CHEST  Findings: On the lung window images there is parenchymal opacity  within the posterior medial left lower lobe with air bronchograms.  This most  likely represents pneumonia. If the patient has  undergone radiation, then this could represent radiation fibrosis.  The right lung is clear. No suspicious lung nodule is seen. No  pleural effusion is noted.  On soft tissue window images, there is a low attenuation nodule in  the lower pole of the right lobe of thyroid posterior medially of  approximately 16 mm in diameter. Ultrasound of the thyroid could  be performed if necessary clinically. The thoracic aorta opacifies  with no  complicating features and the origins of the great vessels  are patent. There are coronary artery calcifications noted. The  pulmonary arteries opacify less well, but no significant  abnormality is seen. No mediastinal or hilar adenopathy is noted.  There is very minimal soft tissue prominence at the  gastroesophageal junction which is of questionable significance.  No definite mass is seen.  IMPRESSION:  1. Very minimal soft tissue prominence at the gastroesophageal  junction. No definite mass.  2. Parenchymal opacity in the left lower lobe. Probable  pneumonia. Correlate clinically.  3. Right lower lobe thyroid nodule of 16 mm in diameter.  4. Coronary artery calcifications.  CT ABDOMEN  Findings: Somewhat prominent soft tissue is again noted at the  gastroesophageal junction, but on sagittal and coronal images is  less impressive than on axial scans. No definite gastroesophageal  mass is seen. The liver enhances with minimal inhomogeneity and no  focal abnormality. No ductal dilatation is seen. No calcified  gallstones are seen. The pancreas is normal in size and the  pancreatic duct is not dilated. The adrenal glands and spleen are  unremarkable. The kidneys enhance and multiple right renal cysts  again are noted with a left lower pole renal cyst stable as well.  The abdominal aorta is normal in caliber. No adenopathy is seen.  The origins of the celiac axis, SMA, renal arteries and IMA are  patent. No bony abnormality is seen.  IMPRESSION:  1. No definite gastroesophageal mass is evident on sagittal and  coronal images.  2. Stable renal cysts.  Original Report Authenticated By: Juline Patch, M.D.        Assessment & Plan:

## 2011-07-13 NOTE — Assessment & Plan Note (Signed)
Start Metformin

## 2011-07-13 NOTE — Assessment & Plan Note (Signed)
  On diet  

## 2011-07-13 NOTE — Assessment & Plan Note (Signed)
Skin bx w/me 

## 2011-08-22 ENCOUNTER — Encounter: Payer: Self-pay | Admitting: Internal Medicine

## 2011-08-22 ENCOUNTER — Ambulatory Visit (INDEPENDENT_AMBULATORY_CARE_PROVIDER_SITE_OTHER): Payer: Medicare Other | Admitting: Internal Medicine

## 2011-08-22 VITALS — BP 140/80 | HR 72 | Temp 97.5°F | Resp 16 | Wt 221.0 lb

## 2011-08-22 DIAGNOSIS — D485 Neoplasm of uncertain behavior of skin: Secondary | ICD-10-CM

## 2011-08-22 HISTORY — PX: SKIN BIOPSY: SHX1

## 2011-08-22 NOTE — Progress Notes (Signed)
  Subjective:    Patient ID: Jon [name], male    DOB: [birth year], 66 y.o.   MRN: [id number]  HPI  Skin bx  Review of Systems     Objective:   Physical Exam    Procedure Note :     Procedure :  Skin biopsy   Indication:   Suspicious lesion(s)   Risks including unsuccessful procedure , bleeding, infection, bruising, scar, a need for another complete procedure and others were explained to the patient in detail as well as the benefits. Informed consent was obtained and signed.   The patient was placed in a decubitus position.  Lesion #1 on  R anter prox thigh measuring 22x11 mm on a stalk   Skin over lesion #1  was prepped with Betadine and alcohol  and anesthetized with 1 cc of 2% lidocaine and epinephrine, using a 25-gauge 1 inch needle.  Shave biopsy with a sterile Dermablade was carried out in the usual fashion. Hyfrecator was used to destroy the rest of the lesion potentially left behind and for hemostasis. Band-Aid was applied with antibiotic ointment.          Assessment & Plan:

## 2011-08-22 NOTE — Assessment & Plan Note (Signed)
Skin bx 

## 2011-08-22 NOTE — Patient Instructions (Addendum)
Postprocedure instructions :    A Band-Aid should be  changed twice daily. You can take a shower tomorrow.  Keep the wounds clean. You can wash them with liquid soap and water. Pat dry with gauze or a Kleenex tissue  Before applying antibiotic ointment and a Band-Aid.   You need to report immediately  if fever, chills or any signs of infection develop.    The biopsy results should be available in 1 -2 weeks. 

## 2011-08-22 NOTE — Addendum Note (Signed)
Addended by: Merrilyn Puma on: 08/22/2011 01:16 PM   Modules accepted: Orders

## 2011-08-23 ENCOUNTER — Telehealth: Payer: Self-pay | Admitting: Internal Medicine

## 2011-08-23 NOTE — Telephone Encounter (Signed)
Stacey, please, inform patient that his skin bx was benign Thx  

## 2011-08-24 NOTE — Telephone Encounter (Signed)
Left detailed mess informing of below.  

## 2011-08-26 ENCOUNTER — Encounter: Payer: Self-pay | Admitting: Radiation Oncology

## 2011-09-02 ENCOUNTER — Ambulatory Visit
Admission: RE | Admit: 2011-09-02 | Discharge: 2011-09-02 | Disposition: A | Discharge status: Home / Self Care | Payer: Medicare Other | Source: Ambulatory Visit | Attending: Radiation Oncology | Admitting: Radiation Oncology

## 2011-09-02 ENCOUNTER — Encounter: Payer: Self-pay | Admitting: Radiation Oncology

## 2011-09-02 VITALS — BP 116/80 | HR 63 | Temp 98.4°F | Resp 20 | Wt 221.8 lb

## 2011-09-02 DIAGNOSIS — C159 Malignant neoplasm of esophagus, unspecified: Secondary | ICD-10-CM

## 2011-09-02 NOTE — Progress Notes (Signed)
Pt denies pain, fatigue, states his appetite has increased.

## 2011-09-03 NOTE — Progress Notes (Signed)
  Radiation Oncology         [phone] ________________________________  Name: Jon [name] MRN: [id number]  Date: 09/02/2011  DOB: [birth year]  Follow-Up Visit Note  CC: Sonda Primes, MD  Ladene Artist, MD  Diagnosis:   Adenocarcinoma of the GE junction/gastric cardia  Narrative:  The patient returns today for routine follow-up.  Patient indicates that he has continued to do very well. Prior radiotherapy completed in September of 2012. He denies any GI complaints at this time. No difficulty swallowing and no pain in the upper abdominal region. The patient did have a PET scan completed in April of 2013. This looks 3 good with no evidence of recurrence or metastasis at that time.                              ALLERGIES:   has no known allergies.  Meds: Current Outpatient Prescriptions  Medication Sig Dispense Refill  . ANTARA 130 MG capsule TAKE ONE CAPSULE BY MOUTH EVERY DAY  30 capsule  11  . aspirin 81 MG tablet Take 81 mg by mouth daily.        . Cholecalciferol 1000 UNITS tablet Take 1,000 Units by mouth daily.       Marland Kitchen diltiazem (CARDIZEM CD) 240 MG 24 hr capsule TAKE 1 CAPSULE BY MOUTH DAILY  30 capsule  5  . metFORMIN (GLUCOPHAGE) 500 MG tablet Take 1 tablet (500 mg total) by mouth daily with breakfast.  90 tablet  3  . omeprazole (PRILOSEC) 40 MG capsule Take 1 capsule (40 mg total) by mouth daily.  30 capsule  5    Physical Findings: The patient is in no acute distress. Patient is alert and oriented.  weight is 221 lb 12.8 oz (100.608 kg). His oral temperature is 98.4 F (36.9 C). His blood pressure is 116/80 and his pulse is 63. His respiration is 20. Marland Kitchen   General: Well-developed, in no acute distress HEENT: Normocephalic, atraumatic Cardiovascular: Regular rate and rhythm Respiratory: Clear to auscultation bilaterally GI: Soft, nontender, normal bowel sounds Extremities: No edema present   Lab Findings: Lab Results  Component Value Date   WBC 4.6 07/06/2011     HGB 14.9 07/06/2011   HCT 44.1 07/06/2011   MCV 86.6 07/06/2011   PLT 218.0 07/06/2011     Radiographic Findings: No results found.  Impression:    Pleasant 66 year old male status post chemoradiotherapy for his adenocarcinoma of the GE junction.  Plan:  The patient is doing excellent and remains clinically NED at this time. The patient has decided against resection and he is continuing to be followed. Thus far he is done 3 well. We discussed followup options and we will have him return to our clinic on a when necessary basis.  I spent 10 minutes with the patient today, the majority of which was spent counseling the patient on the diagnosis of cancer and coordinating care.   Radene Gunning, M.D., Ph.D.

## 2011-09-29 ENCOUNTER — Telehealth: Payer: Self-pay | Admitting: Oncology

## 2011-09-29 ENCOUNTER — Other Ambulatory Visit (HOSPITAL_BASED_OUTPATIENT_CLINIC_OR_DEPARTMENT_OTHER): Payer: Medicare Other

## 2011-09-29 ENCOUNTER — Ambulatory Visit (HOSPITAL_BASED_OUTPATIENT_CLINIC_OR_DEPARTMENT_OTHER): Payer: Medicare Other | Admitting: Oncology

## 2011-09-29 VITALS — BP 123/79 | HR 51 | Temp 97.0°F | Resp 20 | Ht 71.0 in | Wt 215.4 lb

## 2011-09-29 DIAGNOSIS — C169 Malignant neoplasm of stomach, unspecified: Secondary | ICD-10-CM

## 2011-09-29 DIAGNOSIS — D649 Anemia, unspecified: Secondary | ICD-10-CM

## 2011-09-29 DIAGNOSIS — D509 Iron deficiency anemia, unspecified: Secondary | ICD-10-CM

## 2011-09-29 DIAGNOSIS — R718 Other abnormality of red blood cells: Secondary | ICD-10-CM

## 2011-09-29 DIAGNOSIS — E119 Type 2 diabetes mellitus without complications: Secondary | ICD-10-CM

## 2011-09-29 DIAGNOSIS — C160 Malignant neoplasm of cardia: Secondary | ICD-10-CM

## 2011-09-29 LAB — CBC WITH DIFFERENTIAL/PLATELET
Basophils Absolute: 0 10*3/uL (ref 0.0–0.1)
Eosinophils Absolute: 0.2 10*3/uL (ref 0.0–0.5)
HCT: 41.5 % (ref 38.4–49.9)
HGB: 14.7 g/dL (ref 13.0–17.1)
MCH: 28.7 pg (ref 27.2–33.4)
MCV: 80.9 fL (ref 79.3–98.0)
MONO%: 12.1 % (ref 0.0–14.0)
NEUT#: 2.3 10*3/uL (ref 1.5–6.5)
NEUT%: 57.8 % (ref 39.0–75.0)
RDW: 14.6 % (ref 11.0–14.6)

## 2011-09-29 NOTE — Telephone Encounter (Signed)
Gave pt appt calendar  for December 2013 lab and MD 

## 2011-09-29 NOTE — Progress Notes (Signed)
   [facility]    OFFICE PROGRESS NOTE   INTERVAL HISTORY:   He returns as scheduled. No complaint. No dysphagia. No dyspnea or cough. Good appetite and energy level.  Objective:  Vital signs in last 24 hours:  Blood pressure 123/79, pulse 51, temperature 97 F (36.1 C), temperature source Oral, resp. rate 20, height 5\' 11"  (1.803 m), weight 215 lb 6.4 oz (97.705 kg).    HEENT: Neck without mass, oropharynx without visible mass Lymphatics: No cervical, supraclavicular, or axillary nodes Resp: Inspiratory rails/rhonchi at the left posterior base, no respiratory distress Cardio: Regular rate and rhythm GI: No hepatosplenomegaly, nontender Vascular: No leg edema   Lab Results:  Lab Results  Component Value Date   WBC 4.0 09/29/2011   HGB 14.7 09/29/2011   HCT 41.5 09/29/2011   MCV 80.9 09/29/2011   PLT 189 09/29/2011   ANC 2.3  CEA on 06/29/2011- 0.6  Medications: I have reviewed the patient's current medications.  Assessment/Plan: 1. 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 Staging CT scan 08/27/2010 revealed small gastrohepatic ligament, periportal and retrocrural lymph nodes without other evidence of metastatic disease. Staging PET scan on 09/21/2010 showed abnormal malignant range FDG uptake at the site of the biopsy-proven gastric cancer. Small perigastric lymph nodes were not FDG avid above the background level. The radiologist commented that this may reflect a benign etiology or a false-negative appearance due to the small lymph node size. There was no evidence for distant metastatic disease. The CEA was markedly elevated at 482 on 09/14/2010. He completed radiation 09/20/2010 through 10/29/2010. He received concurrent weekly Taxol/carboplatin chemotherapy 09/22/2010 through 10/27/2010. Restaging PET scan 11/22/2010 showed interval resolution of previously demonstrated  hypermetabolic activity within the proximal stomach. There was no evidence of metastatic disease. Restaging PET scan 05/10/2011 showed no evidence of gastroesophageal carcinoma recurrence or metastasis. 2. Status post upper endoscopy 12/17/2010-the very distal portion of the esophagus was ulcerated. The proximal stomach revealed a malignant appearing ulcerative mass in the region of the cardia/gastroesophageal junction. The overall bulk of the lesion was significantly improved but still quite abnormal and large. Multiple biopsies were taken. Pathology was negative for malignancy. 3. Elevated CEA (482) on 09/14/2010. Repeat CEA on 11/25/2010 was normal at 4.7. CEA normal on 06/29/2011 4. Dysphagia secondary to the lower esophagus/upper gastric tumor, resolved. 5. Microcytic anemia. The hemoglobin was normal on 03/31/2011. There was persistent red cell microcytosis. The hemoglobin is normal today with a borderline low MCV 6. Sleep apnea. 7. Early diabetes. 8. History of atrial fibrillation. 9. Hyperlipidemia. 10. History of colon polyps. 11. History of Helicobacter pylori infection in 2011. 12. History of neutropenia secondary to chemotherapy, resolved. 13. CT of the chest on 02/15/2011 with left lower lung density/air bronchograms-? Radiation change.   Disposition:  He remains in clinical remission from the esophagus cancer. The CEA was not checked today. We will as Dr. Posey Rea to check a CEA with the next scheduled lab. Mr. Mabus will return for an office and lab visit in 4 months.   Thornton Papas, MD  09/29/2011  5:22 PM

## 2011-09-29 NOTE — Patient Instructions (Addendum)
[facility] 1945-08-06 [id number]  [facility] Discharge Instructions  Your exam findings, labs and results were discussed with your MD today.  Filed Vitals:   09/29/11 0941  BP: 123/79  Pulse: 51  Temp: 97 F (36.1 C)  Resp: 20   Current outpatient prescriptions:ANTARA 130 MG capsule, TAKE ONE CAPSULE BY MOUTH EVERY DAY, Disp: 30 capsule, Rfl: 11;  aspirin 81 MG tablet, Take 81 mg by mouth daily.  , Disp: , Rfl: ;  Cholecalciferol 1000 UNITS tablet, Take 1,000 Units by mouth daily. , Disp: , Rfl: ;  diltiazem (CARDIZEM CD) 240 MG 24 hr capsule, TAKE 1 CAPSULE BY MOUTH DAILY, Disp: 30 capsule, Rfl: 5 metFORMIN (GLUCOPHAGE) 500 MG tablet, Take 1 tablet (500 mg total) by mouth daily with breakfast., Disp: 90 tablet, Rfl: 3;  omeprazole (PRILOSEC) 40 MG capsule, Take 1 capsule (40 mg total) by mouth daily., Disp: 30 capsule, Rfl: 5  Please visit scheduling to obtain calendar for future appointments.  Please call the [facility] at [phone] during business hours should you have any further questions or need assistance in obtaining follow-up care. If you have a medical emergency, please dial 911.  Special Instructions:

## 2011-09-30 ENCOUNTER — Other Ambulatory Visit: Payer: Self-pay | Admitting: *Deleted

## 2011-09-30 ENCOUNTER — Telehealth: Payer: Self-pay | Admitting: Internal Medicine

## 2011-09-30 DIAGNOSIS — R1310 Dysphagia, unspecified: Secondary | ICD-10-CM

## 2011-09-30 NOTE — Telephone Encounter (Signed)
Will it be ok for the patient to do a lab at [facility] for Dr Alcide Evener.  The lab is CEA with dx code of 787.2?  They will put the lab in the EPIC system.

## 2011-09-30 NOTE — Telephone Encounter (Signed)
Sure.  Thx.

## 2011-10-05 NOTE — Telephone Encounter (Signed)
LMOM for [name] at Dr. Annabell Howells office

## 2011-10-17 ENCOUNTER — Ambulatory Visit (INDEPENDENT_AMBULATORY_CARE_PROVIDER_SITE_OTHER): Payer: Medicare Other | Admitting: Internal Medicine

## 2011-10-17 ENCOUNTER — Encounter: Payer: Self-pay | Admitting: Internal Medicine

## 2011-10-17 VITALS — BP 138/98 | HR 68 | Temp 97.4°F | Resp 16 | Wt 220.0 lb

## 2011-10-17 DIAGNOSIS — C169 Malignant neoplasm of stomach, unspecified: Secondary | ICD-10-CM

## 2011-10-17 DIAGNOSIS — I10 Essential (primary) hypertension: Secondary | ICD-10-CM

## 2011-10-17 DIAGNOSIS — M25519 Pain in unspecified shoulder: Secondary | ICD-10-CM

## 2011-10-17 DIAGNOSIS — IMO0001 Reserved for inherently not codable concepts without codable children: Secondary | ICD-10-CM

## 2011-10-17 DIAGNOSIS — C160 Malignant neoplasm of cardia: Secondary | ICD-10-CM

## 2011-10-17 DIAGNOSIS — M25511 Pain in right shoulder: Secondary | ICD-10-CM

## 2011-10-17 DIAGNOSIS — E1165 Type 2 diabetes mellitus with hyperglycemia: Secondary | ICD-10-CM

## 2011-10-17 NOTE — Assessment & Plan Note (Signed)
Continue with current prescription therapy as reflected on the Med list.  

## 2011-10-17 NOTE — Progress Notes (Signed)
   Subjective:    Patient ID: Jon [name], male    DOB: 04/18/1945, 66 y.o.   MRN: [id number]  HPI   The patient presents for a follow-up of  chronic hypertension, chronic dyslipidemia controlled with medicines. 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 No wt loss per pt - good appetite.Co R shoulder pain x 1.5 years - no change...  Wt Readings from Last 3 Encounters:  10/17/11 220 lb (99.791 kg)  09/29/11 215 lb 6.4 oz (97.705 kg)  09/02/11 221 lb 12.8 oz (100.608 kg)   BP Readings from Last 3 Encounters:  10/17/11 138/98  09/29/11 123/79  09/02/11 116/80     Review of Systems  Constitutional: Negative for appetite change, fatigue and unexpected weight change.  HENT: Negative for nosebleeds, congestion, sore throat, sneezing, trouble swallowing and neck pain.   Eyes: Negative for itching and visual disturbance.  Respiratory: Negative for cough.   Cardiovascular: Negative for chest pain, palpitations and leg swelling.  Gastrointestinal: Negative for nausea, diarrhea, blood in stool and abdominal distention.  Genitourinary: Negative for frequency and hematuria.  Musculoskeletal: Negative for back pain, joint swelling and gait problem.  Skin: Negative for rash.  Neurological: Negative for dizziness, tremors, speech difficulty and weakness.  Psychiatric/Behavioral: Negative for disturbed wake/sleep cycle, dysphoric mood and agitation. The patient is not nervous/anxious.         Objective:   Physical Exam  Constitutional: He is oriented to person, place, and time. He appears well-developed.  HENT:  Mouth/Throat: Oropharynx is clear and moist.  Eyes: Conjunctivae normal are normal. Pupils are equal, round, and reactive to light.  Neck: Normal range of motion. No JVD present. No thyromegaly present.  Cardiovascular: Normal rate, regular rhythm, normal heart sounds and intact distal pulses.  Exam reveals no gallop and no friction rub.   No murmur  heard. Pulmonary/Chest: Effort normal and breath sounds normal. No respiratory distress. He has no wheezes. He has no rales. He exhibits no tenderness.  Abdominal: Soft. Bowel sounds are normal. He exhibits no distension and no mass. There is no tenderness. There is no rebound and no guarding.  Musculoskeletal: Normal range of motion. He exhibits no edema and no tenderness.  Lymphadenopathy:    He has no cervical adenopathy.  Neurological: He is alert and oriented to person, place, and time. He has normal reflexes. No cranial nerve deficit. He exhibits normal muscle tone. Coordination normal.  Skin: Skin is warm and dry. No rash noted.  Psychiatric: He has a normal mood and affect. His behavior is normal. Judgment and thought content normal.   R shoulder is tender i the bicipital groove  Lab Results  Component Value Date   WBC 4.0 09/29/2011   HGB 14.7 09/29/2011   HCT 41.5 09/29/2011   PLT 189 09/29/2011   GLUCOSE 177* 07/06/2011   CHOL 140 07/06/2011   TRIG 263.0* 07/06/2011   HDL 39.60 07/06/2011   LDLDIRECT 50.9 07/06/2011   LDLCALC 63 12/22/2009   ALT 27 07/06/2011   AST 25 07/06/2011   NA 139 07/06/2011   K 4.2 07/06/2011   CL 105 07/06/2011   CREATININE 1.3 07/06/2011   BUN 19 07/06/2011   CO2 27 07/06/2011   TSH 0.66 07/06/2011   PSA 0.92 07/06/2011   HGBA1C 7.8* 11/03/2010             Assessment & Plan:

## 2011-10-17 NOTE — Assessment & Plan Note (Signed)
F/u w/onc is pending - Dr Truett Perna

## 2011-10-17 NOTE — Assessment & Plan Note (Signed)
F/u w/Dr Truett Perna is pending

## 2011-10-17 NOTE — Assessment & Plan Note (Signed)
2012 chronic biceps tendon is tender He declined injection

## 2011-10-25 ENCOUNTER — Other Ambulatory Visit: Payer: Self-pay | Admitting: Internal Medicine

## 2011-10-25 ENCOUNTER — Other Ambulatory Visit: Payer: Self-pay | Admitting: General Practice

## 2011-10-25 MED ORDER — OMEPRAZOLE 40 MG PO CPDR: 40.0000 mg | DELAYED_RELEASE_CAPSULE | Freq: Every day | ORAL | Status: DC

## 2011-11-10 ENCOUNTER — Encounter: Payer: Self-pay | Admitting: Pulmonary Disease

## 2011-11-10 ENCOUNTER — Ambulatory Visit (INDEPENDENT_AMBULATORY_CARE_PROVIDER_SITE_OTHER): Payer: Medicare Other | Admitting: Pulmonary Disease

## 2011-11-10 VITALS — BP 118/72 | HR 47 | Temp 97.5°F | Ht 71.0 in | Wt 224.2 lb

## 2011-11-10 DIAGNOSIS — G4733 Obstructive sleep apnea (adult) (pediatric): Secondary | ICD-10-CM

## 2011-11-10 NOTE — Patient Instructions (Addendum)
Continue on cpap, and call if having any issues. Work on weight loss followup with me in one year.

## 2011-11-10 NOTE — Progress Notes (Signed)
  Subjective:    Patient ID: Jon [name], male    DOB: [birth year], 66 y.o.   MRN: [id number]  HPI Patient comes in today for followup of his known obstructive sleep apnea.  He is wearing CPAP compliantly, and is having no issues with his mask fit or pressure.  He feels that he sleeps well, and is satisfied with his daytime alertness.   Review of Systems  Constitutional: Negative for fever and unexpected weight change.  HENT: Negative for ear pain, nosebleeds, congestion, sore throat, rhinorrhea, sneezing, trouble swallowing, dental problem, postnasal drip and sinus pressure.   Eyes: Negative for redness and itching.  Respiratory: Negative for cough, chest tightness, shortness of breath and wheezing.   Cardiovascular: Negative for palpitations and leg swelling.  Gastrointestinal: Negative for nausea and vomiting.  Genitourinary: Negative for dysuria.  Musculoskeletal: Negative for joint swelling.  Skin: Negative for rash.  Neurological: Negative for headaches.  Hematological: Does not bruise/bleed easily.  Psychiatric/Behavioral: Negative for dysphoric mood. The patient is not nervous/anxious.        Objective:   Physical Exam Overweight male in no acute distress Nose without purulent discharge noted No skin breakdown or pressure necrosis from the CPAP mask Lower extremities without edema, no cyanosis Alert and oriented, moves all 4 extremities.       Assessment & Plan:

## 2011-11-10 NOTE — Assessment & Plan Note (Signed)
The patient is doing well with CPAP currently.  He is having no issues with his equipment, and feels that he sleeps well.  I have asked him to work on weight loss, and to followup with me in one year.

## 2011-12-19 ENCOUNTER — Other Ambulatory Visit: Payer: Self-pay | Admitting: Internal Medicine

## 2012-01-19 ENCOUNTER — Ambulatory Visit (HOSPITAL_BASED_OUTPATIENT_CLINIC_OR_DEPARTMENT_OTHER): Payer: Medicare Other | Admitting: Oncology

## 2012-01-19 ENCOUNTER — Telehealth: Payer: Self-pay | Admitting: Oncology

## 2012-01-19 ENCOUNTER — Other Ambulatory Visit (HOSPITAL_BASED_OUTPATIENT_CLINIC_OR_DEPARTMENT_OTHER): Payer: Medicare Other | Admitting: Lab

## 2012-01-19 VITALS — BP 130/90 | HR 51 | Temp 97.5°F | Resp 20 | Ht 71.0 in | Wt 225.0 lb

## 2012-01-19 DIAGNOSIS — C159 Malignant neoplasm of esophagus, unspecified: Secondary | ICD-10-CM

## 2012-01-19 DIAGNOSIS — C160 Malignant neoplasm of cardia: Secondary | ICD-10-CM

## 2012-01-19 DIAGNOSIS — C169 Malignant neoplasm of stomach, unspecified: Secondary | ICD-10-CM

## 2012-01-19 DIAGNOSIS — D649 Anemia, unspecified: Secondary | ICD-10-CM

## 2012-01-19 DIAGNOSIS — E119 Type 2 diabetes mellitus without complications: Secondary | ICD-10-CM

## 2012-01-19 LAB — CBC WITH DIFFERENTIAL/PLATELET
Eosinophils Absolute: 0.2 10*3/uL (ref 0.0–0.5)
HCT: 43.8 % (ref 38.4–49.9)
LYMPH%: 26.1 % (ref 14.0–49.0)
MCHC: 33.8 g/dL (ref 32.0–36.0)
MCV: 85.8 fL (ref 79.3–98.0)
MONO#: 0.6 10*3/uL (ref 0.1–0.9)
MONO%: 13.3 % (ref 0.0–14.0)
NEUT#: 2.7 10*3/uL (ref 1.5–6.5)
NEUT%: 55.5 % (ref 39.0–75.0)
Platelets: 199 10*3/uL (ref 140–400)
WBC: 4.8 10*3/uL (ref 4.0–10.3)

## 2012-01-19 NOTE — Progress Notes (Signed)
   [facility]    OFFICE PROGRESS NOTE   INTERVAL HISTORY:   He returns as scheduled. Good appetite and energy level. No complaint. No dysphagia.  Objective:  Vital signs in last 24 hours:  Blood pressure 130/90, pulse 51, temperature 97.5 F (36.4 C), temperature source Oral, resp. rate 20, height 5\' 11"  (1.803 m), weight 225 lb (102.059 kg).    HEENT: Neck without mass Lymphatics: No cervical, supraclavicular, axillary, or inguinal nodes Resp: Inspiratory rails at the left lower posterior chest, no respiratory distress Cardio: Regular rate and rhythm GI: No hepatosplenomegaly, nontender, no mass Vascular: No leg edema   Lab Results:  Lab Results  Component Value Date   WBC 4.8 01/19/2012   HGB 14.8 01/19/2012   HCT 43.8 01/19/2012   MCV 85.8 01/19/2012   PLT 199 01/19/2012   CEA pending    Medications: I have reviewed the patient's current medications.  Assessment/Plan: 1. 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 Staging CT scan 08/27/2010 revealed small gastrohepatic ligament, periportal and retrocrural lymph nodes without other evidence of metastatic disease. Staging PET scan on 09/21/2010 showed abnormal malignant range FDG uptake at the site of the biopsy-proven gastric cancer. Small perigastric lymph nodes were not FDG avid above the background level. The radiologist commented that this may reflect a benign etiology or a false-negative appearance due to the small lymph node size. There was no evidence for distant metastatic disease. The CEA was markedly elevated at 482 on 09/14/2010. He completed radiation 09/20/2010 through 10/29/2010. He received concurrent weekly Taxol/carboplatin chemotherapy 09/22/2010 through 10/27/2010. Restaging PET scan 11/22/2010 showed interval resolution of previously demonstrated hypermetabolic activity within the proximal stomach.  There was no evidence of metastatic disease. Restaging PET scan 05/10/2011 showed no evidence of gastroesophageal carcinoma recurrence or metastasis. 2. Status post upper endoscopy 12/17/2010-the very distal portion of the esophagus was ulcerated. The proximal stomach revealed a malignant appearing ulcerative mass in the region of the cardia/gastroesophageal junction. The overall bulk of the lesion was significantly improved but still quite abnormal and large. Multiple biopsies were taken. Pathology was negative for malignancy. 3. Elevated CEA (482) on 09/14/2010. Repeat CEA on 11/25/2010 was normal at 4.7. CEA normal on 06/29/2011 4. Dysphagia secondary to the lower esophagus/upper gastric tumor, resolved. 5. Microcytic anemia. The hemoglobin was normal on 03/31/2011. There was persistent red cell microcytosis. The hemoglobin is normal today . 6. Sleep apnea. 7. Early diabetes. 8. History of atrial fibrillation. 9. Hyperlipidemia. 10. History of colon polyps. 11. History of Helicobacter pylori infection in 2011. 12. History of neutropenia secondary to chemotherapy, resolved. 13. CT of the chest on 02/15/2011 with left lower lung density/air bronchograms-? Radiation change.  Disposition:  He remains in clinical remission from gastroesophageal cancer. We will followup on the CEA from today. Mr. Nephew will return for an office and lab visit in 4 months.   Thornton Papas, MD  01/19/2012  11:15 AM

## 2012-01-19 NOTE — Telephone Encounter (Signed)
appts made and printed for pt aom °

## 2012-01-24 ENCOUNTER — Telehealth: Payer: Self-pay | Admitting: *Deleted

## 2012-01-24 NOTE — Telephone Encounter (Signed)
Message copied by Phillis Knack on Tue Jan 24, 2012 10:16 AM ------      Message from: Wandalee Ferdinand      Created: Tue Jan 24, 2012 10:11 AM                   ----- Message -----         From: Ladene Artist, MD         Sent: 01/21/2012   8:35 PM           To: Wandalee Ferdinand, RN, Glori Luis, RN, #            Please call patient, cea is normal, f/u as scheduled

## 2012-01-24 NOTE — Telephone Encounter (Signed)
Notified of cea results as noted below.

## 2012-02-16 ENCOUNTER — Ambulatory Visit (INDEPENDENT_AMBULATORY_CARE_PROVIDER_SITE_OTHER): Payer: Medicare Other | Admitting: Internal Medicine

## 2012-02-16 ENCOUNTER — Other Ambulatory Visit (INDEPENDENT_AMBULATORY_CARE_PROVIDER_SITE_OTHER): Payer: Medicare Other

## 2012-02-16 ENCOUNTER — Encounter: Payer: Self-pay | Admitting: Internal Medicine

## 2012-02-16 VITALS — BP 138/80 | HR 72 | Temp 97.4°F | Resp 16 | Wt 226.0 lb

## 2012-02-16 DIAGNOSIS — E1165 Type 2 diabetes mellitus with hyperglycemia: Secondary | ICD-10-CM

## 2012-02-16 DIAGNOSIS — IMO0001 Reserved for inherently not codable concepts without codable children: Secondary | ICD-10-CM

## 2012-02-16 DIAGNOSIS — E119 Type 2 diabetes mellitus without complications: Secondary | ICD-10-CM | POA: Insufficient documentation

## 2012-02-16 DIAGNOSIS — I10 Essential (primary) hypertension: Secondary | ICD-10-CM

## 2012-02-16 DIAGNOSIS — C160 Malignant neoplasm of cardia: Secondary | ICD-10-CM

## 2012-02-16 LAB — BASIC METABOLIC PANEL
BUN: 16 mg/dL (ref 6–23)
CO2: 28 mEq/L (ref 19–32)
Calcium: 9.4 mg/dL (ref 8.4–10.5)
Creatinine, Ser: 1.3 mg/dL (ref 0.4–1.5)
Glucose, Bld: 160 mg/dL — ABNORMAL HIGH (ref 70–99)

## 2012-02-16 LAB — HEPATIC FUNCTION PANEL
ALT: 33 U/L (ref 0–53)
Albumin: 4.3 g/dL (ref 3.5–5.2)
Total Protein: 7.1 g/dL (ref 6.0–8.3)

## 2012-02-16 NOTE — Assessment & Plan Note (Signed)
Doing well F/u with Dr Truett Perna

## 2012-02-16 NOTE — Assessment & Plan Note (Signed)
Labs

## 2012-02-16 NOTE — Assessment & Plan Note (Signed)
Continue with current prescription therapy as reflected on the Med list.  

## 2012-02-16 NOTE — Progress Notes (Signed)
   Subjective:    HPI   The patient presents for a follow-up of  chronic hypertension, chronic dyslipidemia controlled with medicines. 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 No wt loss per pt - good appetite. F/u on R shoulder pain >1.5 years - better...  Wt Readings from Last 3 Encounters:  02/16/12 226 lb (102.513 kg)  01/19/12 225 lb (102.059 kg)  11/10/11 224 lb 3.2 oz (101.696 kg)   BP Readings from Last 3 Encounters:  02/16/12 138/80  01/19/12 130/90  11/10/11 118/72     Review of Systems  Constitutional: Negative for appetite change, fatigue and unexpected weight change.  HENT: Negative for nosebleeds, congestion, sore throat, sneezing, trouble swallowing and neck pain.   Eyes: Negative for itching and visual disturbance.  Respiratory: Negative for cough.   Cardiovascular: Negative for chest pain, palpitations and leg swelling.  Gastrointestinal: Negative for nausea, diarrhea, blood in stool and abdominal distention.  Genitourinary: Negative for frequency and hematuria.  Musculoskeletal: Negative for back pain, joint swelling and gait problem.  Skin: Negative for rash.  Neurological: Negative for dizziness, tremors, speech difficulty and weakness.  Psychiatric/Behavioral: Negative for sleep disturbance, dysphoric mood and agitation. The patient is not nervous/anxious.         Objective:   Physical Exam  Constitutional: He is oriented to person, place, and time. He appears well-developed.  HENT:  Mouth/Throat: Oropharynx is clear and moist.  Eyes: Conjunctivae normal are normal. Pupils are equal, round, and reactive to light.  Neck: Normal range of motion. No JVD present. No thyromegaly present.  Cardiovascular: Normal rate, regular rhythm, normal heart sounds and intact distal pulses.  Exam reveals no gallop and no friction rub.   No murmur heard. Pulmonary/Chest: Effort normal and breath sounds normal. No respiratory distress. He has no wheezes.  He has no rales. He exhibits no tenderness.  Abdominal: Soft. Bowel sounds are normal. He exhibits no distension and no mass. There is no tenderness. There is no rebound and no guarding.  Musculoskeletal: Normal range of motion. He exhibits no edema and no tenderness.  Lymphadenopathy:    He has no cervical adenopathy.  Neurological: He is alert and oriented to person, place, and time. He has normal reflexes. No cranial nerve deficit. He exhibits normal muscle tone. Coordination normal.  Skin: Skin is warm and dry. No rash noted.  Psychiatric: He has a normal mood and affect. His behavior is normal. Judgment and thought content normal.   R shoulder is less tender in the bicipital groove  Lab Results  Component Value Date   WBC 4.8 01/19/2012   HGB 14.8 01/19/2012   HCT 43.8 01/19/2012   PLT 199 01/19/2012   GLUCOSE 177* 07/06/2011   CHOL 140 07/06/2011   TRIG 263.0* 07/06/2011   HDL 39.60 07/06/2011   LDLDIRECT 50.9 07/06/2011   LDLCALC 63 12/22/2009   ALT 27 07/06/2011   AST 25 07/06/2011   NA 139 07/06/2011   K 4.2 07/06/2011   CL 105 07/06/2011   CREATININE 1.3 07/06/2011   BUN 19 07/06/2011   CO2 27 07/06/2011   TSH 0.66 07/06/2011   PSA 0.92 07/06/2011   HGBA1C 7.8* 11/03/2010             Assessment & Plan:

## 2012-02-19 ENCOUNTER — Encounter: Payer: Self-pay | Admitting: Internal Medicine

## 2012-04-13 ENCOUNTER — Other Ambulatory Visit: Payer: Self-pay | Admitting: Internal Medicine

## 2012-05-10 ENCOUNTER — Ambulatory Visit (INDEPENDENT_AMBULATORY_CARE_PROVIDER_SITE_OTHER): Payer: Medicare Other | Admitting: Internal Medicine

## 2012-05-10 ENCOUNTER — Ambulatory Visit (HOSPITAL_BASED_OUTPATIENT_CLINIC_OR_DEPARTMENT_OTHER): Payer: Medicare Other | Admitting: Oncology

## 2012-05-10 ENCOUNTER — Encounter: Payer: Self-pay | Admitting: Internal Medicine

## 2012-05-10 ENCOUNTER — Other Ambulatory Visit (HOSPITAL_BASED_OUTPATIENT_CLINIC_OR_DEPARTMENT_OTHER): Payer: Medicare Other | Admitting: Lab

## 2012-05-10 ENCOUNTER — Telehealth: Payer: Self-pay | Admitting: Oncology

## 2012-05-10 VITALS — BP 131/82 | HR 46 | Temp 97.3°F | Resp 18 | Ht 71.0 in | Wt 221.9 lb

## 2012-05-10 VITALS — BP 131/78 | HR 49 | Ht >= 80 in | Wt 226.5 lb

## 2012-05-10 DIAGNOSIS — C169 Malignant neoplasm of stomach, unspecified: Secondary | ICD-10-CM

## 2012-05-10 DIAGNOSIS — I4891 Unspecified atrial fibrillation: Secondary | ICD-10-CM

## 2012-05-10 DIAGNOSIS — Z8509 Personal history of malignant neoplasm of other digestive organs: Secondary | ICD-10-CM

## 2012-05-10 DIAGNOSIS — I10 Essential (primary) hypertension: Secondary | ICD-10-CM

## 2012-05-10 LAB — CBC WITH DIFFERENTIAL/PLATELET
BASO%: 0.5 % (ref 0.0–2.0)
LYMPH%: 32.3 % (ref 14.0–49.0)
MCHC: 32.9 g/dL (ref 32.0–36.0)
MONO#: 0.6 10*3/uL (ref 0.1–0.9)
Platelets: 226 10*3/uL (ref 140–400)
RBC: 5.45 10*6/uL (ref 4.20–5.82)
RDW: 15.2 % — ABNORMAL HIGH (ref 11.0–14.6)
WBC: 5 10*3/uL (ref 4.0–10.3)
lymph#: 1.6 10*3/uL (ref 0.9–3.3)

## 2012-05-10 LAB — CEA: CEA: 1.2 ng/mL (ref 0.0–5.0)

## 2012-05-10 NOTE — Progress Notes (Addendum)
HPI Patient is a 67 yo with a history of PAF, HTN, DM.  I saw him in 2012. His last spell of documented Afib is in 2007. He denies palpitations  No SOB He is also followed by Mancel Bale for Ca.  Doing well he says.  No GI blood loss.  No Known Allergies  Current Outpatient Prescriptions  Medication Sig Dispense Refill  . aspirin 81 MG tablet Take 81 mg by mouth daily.        . Cholecalciferol 1000 UNITS tablet Take 1,000 Units by mouth daily.       Marland Kitchen diltiazem (CARDIZEM CD) 240 MG 24 hr capsule TAKE 1 CAPSULE BY MOUTH DAILY  30 capsule  5  . fenofibrate micronized (ANTARA) 130 MG capsule TAKE ONE CAPSULE BY MOUTH EVERY DAY  30 capsule  5  . metFORMIN (GLUCOPHAGE) 500 MG tablet Take 1 tablet (500 mg total) by mouth daily with breakfast.  90 tablet  3  . omeprazole (PRILOSEC) 40 MG capsule Take 1 capsule (40 mg total) by mouth daily.  30 capsule  5   No current facility-administered medications for this visit.    Past Medical History  Diagnosis Date  . Atrial fibrillation   . Other and unspecified hyperlipidemia   . Unspecified sleep apnea   . Glaucoma(365)   . Cataract   . GERD (gastroesophageal reflux disease)   . H. pylori infection 2011  . Diverticulosis   . Adenomatous colon polyp   . Anemia   . Heart murmur   . Gastroesophageal cancer 2012    Gastroesophageal /radiation Tx and Chemo  . History of radiation therapy 09/20/10 thru 10/29/10    gastroesophageal junction/gastric cardia  . Status post chemotherapy 09/22/10 thru 10/27/10    concurrent w/radiation    Past Surgical History  Procedure Laterality Date  . Tonsillectomy    . Elbow bursa surgery    . Colonoscopy    . Leg surgery  1980    right leg   . Cataract extraction      bilateral  . Skin biopsy  08/22/11    shave biopsy =benign    Family History  Problem Relation Age of Onset  . Coronary artery disease Neg Hx   . Arthritis Mother   . Diabetes Brother   . Diabetes Father   . Colon cancer Neg Hx      History   Social History  . Marital Status: Divorced    Spouse Name: N/A    Number of Children: 2  . Years of Education: N/A   Occupational History  . retired    Social History Main Topics  . Smoking status: Former Smoker -- 1.00 packs/day for 40 years    Types: Cigarettes    Quit date: 01/31/2005  . Smokeless tobacco: Never Used  . Alcohol Use: 3.6 oz/week    2 Glasses of wine, 2 Cans of beer, 2 Shots of liquor per week  . Drug Use: No  . Sexually Active: Yes   Other Topics Concern  . Not on file   Social History Narrative  . No narrative on file    Review of Systems:  All systems reviewed.  They are negative to the above problem except as previously stated.  Vital Signs: BP 131/78  Pulse 49  Ht 7' (2.134 m)  Wt 226 lb 8 oz (102.74 kg)  BMI 22.56 kg/m2  SpO2 98%  Physical Exam  HEENT:  Normocephalic, atraumatic. EOMI, PERRLA.  Neck: JVP is normal.  No bruits.  Lungs: clear to auscultation. No rales no wheezes.  Heart: Regular rate and rhythm. Normal S1, S2. No S3.   No significant murmurs. PMI not displaced.  Abdomen:  Supple, nontender. Normal bowel sounds. No masses. No hepatomegaly.  Extremities:   Good distal pulses throughout. No lower extremity edema.  Musculoskeletal :moving all extremities.  Neuro:   alert and oriented x3.  CN II-XII grossly intact.  EKG SB 49.  Assessment and Plan:  1.  PAF  Last spell was in 2007  Will review previous records.  I would not change meds now.  2.  HTN  BP is excellent.  F/U 1 year.  05/29/12.  Review of MUSE EKG from 2007 showed atrial fibrillation.  Ptient went to ER with palpitations. No other documented spells.  No symptoms.   I would follow.

## 2012-05-10 NOTE — Telephone Encounter (Signed)
gv pt appt schedule for August.  °

## 2012-05-10 NOTE — Progress Notes (Signed)
   [facility]    OFFICE PROGRESS NOTE   INTERVAL HISTORY:   He returns as scheduled. He feels well. No complaint. No dysphagia. No bleeding.  Objective:  Vital signs in last 24 hours:  Blood pressure 131/82, pulse 46, temperature 97.3 F (36.3 C), temperature source Oral, resp. rate 18, height 5\' 11"  (1.803 m), weight 221 lb 14.4 oz (100.653 kg).    HEENT: neck without mass Lymphatics: no cervical, supraclavicular, or axillary nodes Resp: inspiratory rales at the left posterior base, no respiratory distress Cardio: regular rate and rhythm GI: no hepatomegaly, nontender, no mass Vascular: no leg edema      Lab Results:  Lab Results  Component Value Date   WBC 5.0 05/10/2012   HGB 15.1 05/10/2012   HCT 45.8 05/10/2012   MCV 84.0 05/10/2012   PLT 226 05/10/2012  ANC 2.6    Medications: I have reviewed the patient's current medications.  Assessment/Plan: 1. 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 Staging CT scan 08/27/2010 revealed small gastrohepatic ligament, periportal and retrocrural lymph nodes without other evidence of metastatic disease. Staging PET scan on 09/21/2010 showed abnormal malignant range FDG uptake at the site of the biopsy-proven gastric cancer. Small perigastric lymph nodes were not FDG avid above the background level. The radiologist commented that this may reflect a benign etiology or a false-negative appearance due to the small lymph node size. There was no evidence for distant metastatic disease. The CEA was markedly elevated at 482 on 09/14/2010. He completed radiation 09/20/2010 through 10/29/2010. He received concurrent weekly Taxol/carboplatin chemotherapy 09/22/2010 through 10/27/2010. Restaging PET scan 11/22/2010 showed interval resolution of previously demonstrated hypermetabolic activity within the proximal stomach. There was no evidence of  metastatic disease. Restaging PET scan 05/10/2011 showed no evidence of gastroesophageal carcinoma recurrence or metastasis. 2. Status post upper endoscopy 12/17/2010-the very distal portion of the esophagus was ulcerated. The proximal stomach revealed a malignant appearing ulcerative mass in the region of the cardia/gastroesophageal junction. The overall bulk of the lesion was significantly improved but still quite abnormal and large. Multiple biopsies were taken. Pathology was negative for malignancy. 3. Elevated CEA (482) on 09/14/2010. Repeat CEA on 11/25/2010 was normal at 4.7. CEA normal 05/10/2012 at 1.2 4. Dysphagia secondary to the lower esophagus/upper gastric tumor, resolved. 5. Microcytic anemia. The hemoglobin was normal on 03/31/2011. There was persistent red cell microcytosis. The hemoglobin is normal today . 6. Sleep apnea. 7. Early diabetes. 8. History of atrial fibrillation. 9. Hyperlipidemia. 10. History of colon polyps. 11. History of Helicobacter pylori infection in 2011. 12. History of neutropenia secondary to chemotherapy, resolved. 13. CT of the chest on 02/15/2011 with left lower lung density/air bronchograms-? Radiation change.  Disposition:  He remains in clinical remission from GE junction carcinoma. Mr. Shock will return for an office visit and CEA in 4 months.   Thornton Papas, MD  05/10/2012  9:24 PM

## 2012-05-15 ENCOUNTER — Telehealth: Payer: Self-pay | Admitting: *Deleted

## 2012-05-15 NOTE — Telephone Encounter (Signed)
Message copied by Wandalee Ferdinand on Tue May 15, 2012 11:28 AM ------      Message from: Thornton Papas B      Created: Thu May 10, 2012  9:47 PM       Please call patient, cea is normal ------

## 2012-05-15 NOTE — Telephone Encounter (Signed)
Notified via VM that labs are normal.

## 2012-06-16 ENCOUNTER — Other Ambulatory Visit: Payer: Self-pay | Admitting: Internal Medicine

## 2012-07-14 ENCOUNTER — Other Ambulatory Visit: Payer: Self-pay | Admitting: Internal Medicine

## 2012-07-16 ENCOUNTER — Other Ambulatory Visit: Payer: Self-pay | Admitting: Internal Medicine

## 2012-07-22 IMAGING — PT NM PET TUM IMG SKULL BASE T - THIGH
6 series · 25 of 25 positions shown · non-contrast
Comparison: PET CT 09/21/2010; CTs of the chest, abdomen and pelvis
08/27/2010.

CLINICAL DATA: Restaging subsequent therapy for gastric cancer
status post completion of chemotherapy and radiation therapy

NUCLEAR MEDICINE FDG PET CT TUMOR SUBSEQUENT IMAGING- (SKULL BASE
THROUGH THIGHS)
TECHNIQUE: 15.1 mCi F-18 FDG was injected intravenously via the
left antecubital fossa.  Full-ring PET imaging was performed from
the skull base through the mid-thighs 63  minutes after injection.
CT data was obtained and used for attenuation correction and
anatomic localization only.  (This was not acquired as a diagnostic
CT examination.)
Fasting Blood Glucose:  128

[Series 1: pet ac · axial · 3.3mm · 4.69mm/px · z∈[-899,-29]mm · 5 of 267 slices shown]
[im 1/267]
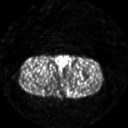
[im 67/267]
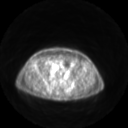
[im 134/267]
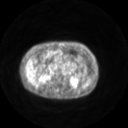
[im 200/267]
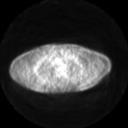
[im 267/267]
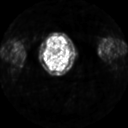

[Series 2: pet nac · axial · 3.3mm · 4.69mm/px · z∈[-899,-29]mm · 6 of 267 slices shown]
[im 1/267]
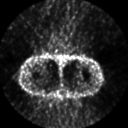
[im 54/267]
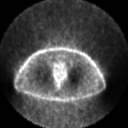
[im 107/267]
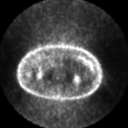
[im 160/267]
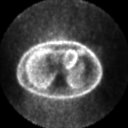
[im 213/267]
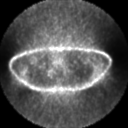
[im 267/267]
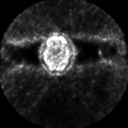

[Series 2: ct images · axial · 3.8mm · 0.98mm/px · z∈[-899,-30]mm · 5 of 260 slices shown]
[im 1/260]
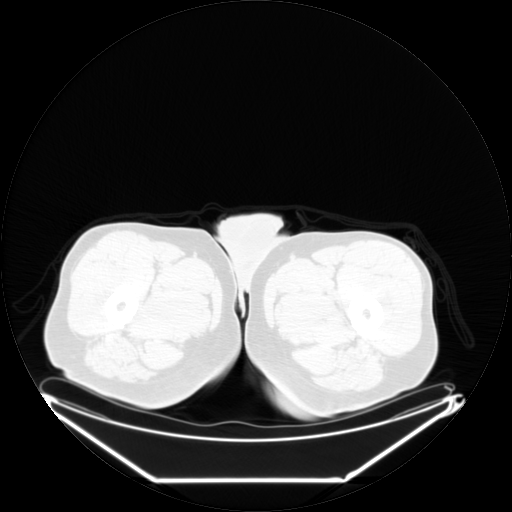
[im 65/260]
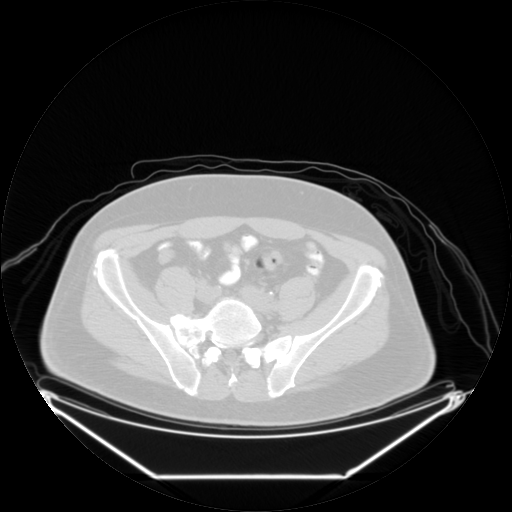
[im 130/260]
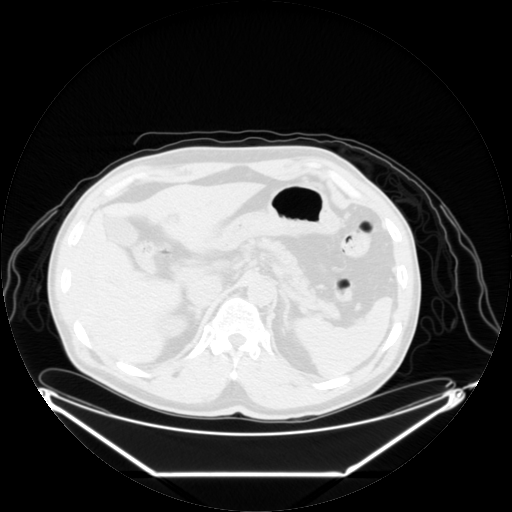
[im 195/260]
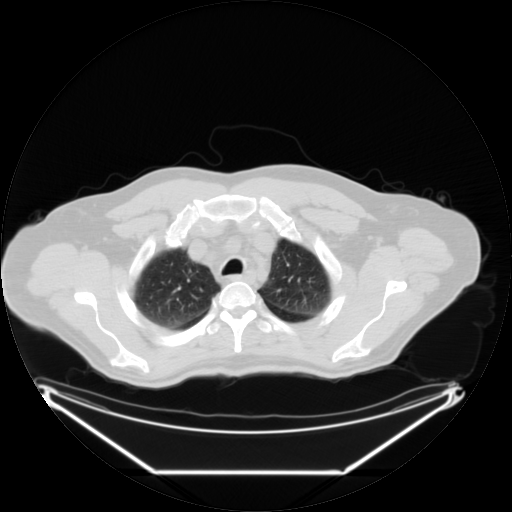
[im 260/260  brain]
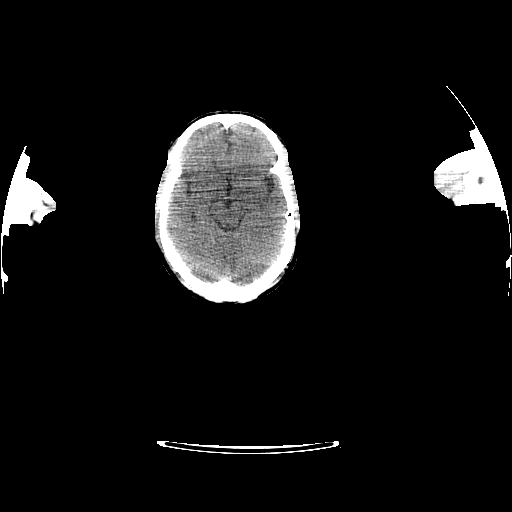

[Series 123: mip · coronal · 3.3mm · 4.69mm/px · 1 of 30 slices shown]
[im 1/30]
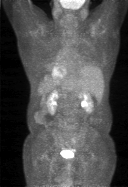

[Series 151: reformatted · axial · 3.3mm · 3.91mm/px · z∈[-899,-29]mm · 6 of 265 slices shown (1 of 2)]
[im 1/265]
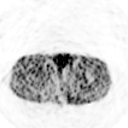
[im 53/265]
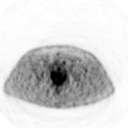
[im 106/265]
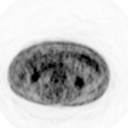
[im 159/265]
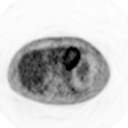
[im 212/265]
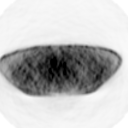
[im 265/265]
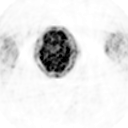

[Series 153: reformatted · coronal · 4.7mm · 6.98mm/px · 2 of 71 slices shown (2 of 2)]
[im 1/71]
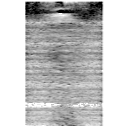
[im 71/71]
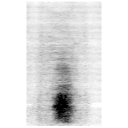

[25 of 25 positions shown; findings below may reference images not displayed]

FINDINGS: There is no significant residual hypermetabolic activity
proximally in the gastric walls.  The activity in this area is
similar to background with an SUV max of 4.4. There is no
hypermetabolic nodal activity within the abdomen.  The hepatic
activity remains slightly heterogeneous without focal lesion.

There is no suspicious hypermetabolic could be within the chest,
abdomen or pelvis.  Slightly heterogeneous thyroid activity is
unchanged.  The lungs are clear.  Coronary artery disease,
bilateral renal cysts and a small paraumbilical hernia containing
fat are again noted. Some fluid is noted within the left iliopsoas
bursa.  There are no suspicious osseous findings.
IMPRESSION: 1.  Interval resolution of previously demonstrated hypermetabolic
activity within the proximal stomach.
2.  No evidence of metastatic disease.
3.  Stable incidental CT findings.

## 2012-08-15 ENCOUNTER — Other Ambulatory Visit: Payer: Self-pay | Admitting: Internal Medicine

## 2012-08-16 ENCOUNTER — Ambulatory Visit (INDEPENDENT_AMBULATORY_CARE_PROVIDER_SITE_OTHER): Payer: Medicare Other | Admitting: Internal Medicine

## 2012-08-16 ENCOUNTER — Other Ambulatory Visit (INDEPENDENT_AMBULATORY_CARE_PROVIDER_SITE_OTHER): Payer: Medicare Other

## 2012-08-16 ENCOUNTER — Encounter: Payer: Self-pay | Admitting: Internal Medicine

## 2012-08-16 VITALS — BP 122/90 | HR 68 | Temp 97.4°F | Resp 16 | Wt 229.0 lb

## 2012-08-16 DIAGNOSIS — C160 Malignant neoplasm of cardia: Secondary | ICD-10-CM

## 2012-08-16 DIAGNOSIS — R202 Paresthesia of skin: Secondary | ICD-10-CM

## 2012-08-16 DIAGNOSIS — E785 Hyperlipidemia, unspecified: Secondary | ICD-10-CM

## 2012-08-16 DIAGNOSIS — R209 Unspecified disturbances of skin sensation: Secondary | ICD-10-CM

## 2012-08-16 DIAGNOSIS — E119 Type 2 diabetes mellitus without complications: Secondary | ICD-10-CM

## 2012-08-16 DIAGNOSIS — Z8679 Personal history of other diseases of the circulatory system: Secondary | ICD-10-CM

## 2012-08-16 DIAGNOSIS — C169 Malignant neoplasm of stomach, unspecified: Secondary | ICD-10-CM

## 2012-08-16 LAB — BASIC METABOLIC PANEL
Calcium: 9.7 mg/dL (ref 8.4–10.5)
GFR: 70.15 mL/min (ref >60.00)
Glucose, Bld: 139 mg/dL — ABNORMAL HIGH (ref 70–99)
Potassium: 3.9 mEq/L (ref 3.5–5.1)
Sodium: 136 mEq/L (ref 135–145)

## 2012-08-16 LAB — HEPATIC FUNCTION PANEL
AST: 26 U/L (ref 0–37)
Albumin: 4.5 g/dL (ref 3.5–5.2)
Alkaline Phosphatase: 75 U/L (ref 39–117)
Total Bilirubin: 0.4 mg/dL (ref 0.3–1.2)

## 2012-08-16 LAB — HEMOGLOBIN A1C: Hgb A1c MFr Bld: 7.8 % — ABNORMAL HIGH (ref 4.6–6.5)

## 2012-08-16 NOTE — Assessment & Plan Note (Signed)
Continue with current prescription therapy as reflected on the Med list.  

## 2012-08-16 NOTE — Progress Notes (Signed)
Patient ID: Jon [name], male   DOB: Jun 06, 1945, 67 y.o.   MRN: [id number]   Subjective:    HPI   The patient presents for a follow-up of  chronic hypertension, chronic dyslipidemia controlled with medicines. 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 No wt loss per pt - good appetite. F/u on R shoulder pain >1.5 years - better...  Wt Readings from Last 3 Encounters:  08/16/12 229 lb (103.874 kg)  05/10/12 221 lb 14.4 oz (100.653 kg)  05/10/12 226 lb 8 oz (102.74 kg)   BP Readings from Last 3 Encounters:  08/16/12 122/90  05/10/12 131/82  05/10/12 131/78     Review of Systems  Constitutional: Negative for appetite change, fatigue and unexpected weight change.  HENT: Negative for nosebleeds, congestion, sore throat, sneezing, trouble swallowing and neck pain.   Eyes: Negative for itching and visual disturbance.  Respiratory: Negative for cough.   Cardiovascular: Negative for chest pain, palpitations and leg swelling.  Gastrointestinal: Negative for nausea, diarrhea, blood in stool and abdominal distention.  Genitourinary: Negative for frequency and hematuria.  Musculoskeletal: Negative for back pain, joint swelling and gait problem.  Skin: Negative for rash.  Neurological: Negative for dizziness, tremors, speech difficulty and weakness.  Psychiatric/Behavioral: Negative for sleep disturbance, dysphoric mood and agitation. The patient is not nervous/anxious.         Objective:   Physical Exam  Constitutional: He is oriented to person, place, and time. He appears well-developed.  HENT:  Mouth/Throat: Oropharynx is clear and moist.  Eyes: Conjunctivae are normal. Pupils are equal, round, and reactive to light.  Neck: Normal range of motion. No JVD present. No thyromegaly present.  Cardiovascular: Normal rate, regular rhythm, normal heart sounds and intact distal pulses.  Exam reveals no gallop and no friction rub.   No murmur heard. Pulmonary/Chest:  Effort normal and breath sounds normal. No respiratory distress. He has no wheezes. He has no rales. He exhibits no tenderness.  Abdominal: Soft. Bowel sounds are normal. He exhibits no distension and no mass. There is no tenderness. There is no rebound and no guarding.  Musculoskeletal: Normal range of motion. He exhibits no edema and no tenderness.  Lymphadenopathy:    He has no cervical adenopathy.  Neurological: He is alert and oriented to person, place, and time. He has normal reflexes. No cranial nerve deficit. He exhibits normal muscle tone. Coordination normal.  Skin: Skin is warm and dry. No rash noted.  Psychiatric: He has a normal mood and affect. His behavior is normal. Judgment and thought content normal.  Thick toenails R shoulder is not tender in the bicipital groove  Lab Results  Component Value Date   WBC 5.0 05/10/2012   HGB 15.1 05/10/2012   HCT 45.8 05/10/2012   PLT 226 05/10/2012   GLUCOSE 160* 02/16/2012   CHOL 140 07/06/2011   TRIG 263.0* 07/06/2011   HDL 39.60 07/06/2011   LDLDIRECT 50.9 07/06/2011   LDLCALC 63 12/22/2009   ALT 33 02/16/2012   AST 32 02/16/2012   NA 136 02/16/2012   K 4.0 02/16/2012   CL 101 02/16/2012   CREATININE 1.3 02/16/2012   BUN 16 02/16/2012   CO2 28 02/16/2012   TSH 0.66 07/06/2011   PSA 0.92 07/06/2011   HGBA1C 7.3* 02/16/2012             Assessment & Plan:

## 2012-08-16 NOTE — Assessment & Plan Note (Signed)
Chemo and XRT s/p 

## 2012-08-16 NOTE — Assessment & Plan Note (Signed)
Chemo and XRT s/p

## 2012-08-16 NOTE — Assessment & Plan Note (Signed)
Doing well 

## 2012-09-10 ENCOUNTER — Ambulatory Visit (HOSPITAL_BASED_OUTPATIENT_CLINIC_OR_DEPARTMENT_OTHER): Payer: Medicare Other | Admitting: Oncology

## 2012-09-10 ENCOUNTER — Other Ambulatory Visit (HOSPITAL_BASED_OUTPATIENT_CLINIC_OR_DEPARTMENT_OTHER): Payer: Medicare Other

## 2012-09-10 ENCOUNTER — Telehealth: Payer: Self-pay | Admitting: Oncology

## 2012-09-10 VITALS — BP 129/76 | HR 51 | Temp 98.3°F | Resp 20 | Ht 71.0 in | Wt 226.8 lb

## 2012-09-10 DIAGNOSIS — C160 Malignant neoplasm of cardia: Secondary | ICD-10-CM

## 2012-09-10 DIAGNOSIS — C169 Malignant neoplasm of stomach, unspecified: Secondary | ICD-10-CM

## 2012-09-10 LAB — CBC WITH DIFFERENTIAL/PLATELET
Basophils Absolute: 0 10*3/uL (ref 0.0–0.1)
Eosinophils Absolute: 0.2 10*3/uL (ref 0.0–0.5)
HGB: 15.3 g/dL (ref 13.0–17.1)
MONO#: 0.7 10*3/uL (ref 0.1–0.9)
NEUT#: 2.6 10*3/uL (ref 1.5–6.5)
Platelets: 238 10*3/uL (ref 140–400)
RBC: 5.26 10*6/uL (ref 4.20–5.82)
RDW: 15 % — ABNORMAL HIGH (ref 11.0–14.6)
WBC: 5.4 10*3/uL (ref 4.0–10.3)

## 2012-09-10 LAB — CEA: CEA: 1.1 ng/mL (ref 0.0–5.0)

## 2012-09-10 NOTE — Progress Notes (Signed)
   [facility]    OFFICE PROGRESS NOTE   INTERVAL HISTORY:   He returns as scheduled. No dysphagia. No complaint.  Objective:  Vital signs in last 24 hours:  Blood pressure 129/76, pulse 51, temperature 98.3 F (36.8 C), temperature source Oral, resp. rate 20, height 5\' 11"  (1.803 m), weight 226 lb 12.8 oz (102.876 kg).    HEENT: Neck without mass Lymphatics: No cervical, supraclavicular, or axillary nodes Resp: Lungs with inspiratory rales at the left base, no respiratory distress Cardio: Regular rate and rhythm GI: No hepatosplenomegaly, nontender Vascular: No leg edema   Lab Results:  Lab Results  Component Value Date   WBC 5.4 09/10/2012   HGB 15.3 09/10/2012   HCT 44.4 09/10/2012   MCV 84.4 09/10/2012   PLT 238 09/10/2012   ANC 2.6 CEA pending   Medications: I have reviewed the patient's current medications.  Assessment/Plan: 1. 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 Staging CT scan 08/27/2010 revealed small gastrohepatic ligament, periportal and retrocrural lymph nodes without other evidence of metastatic disease. Staging PET scan on 09/21/2010 showed abnormal malignant range FDG uptake at the site of the biopsy-proven gastric cancer. Small perigastric lymph nodes were not FDG avid above the background level. The radiologist commented that this may reflect a benign etiology or a false-negative appearance due to the small lymph node size. There was no evidence for distant metastatic disease. The CEA was markedly elevated at 482 on 09/14/2010. He completed radiation 09/20/2010 through 10/29/2010. He received concurrent weekly Taxol/carboplatin chemotherapy 09/22/2010 through 10/27/2010. Restaging PET scan 11/22/2010 showed interval resolution of previously demonstrated hypermetabolic activity within the proximal stomach. There was no evidence of metastatic disease.  Restaging PET scan 05/10/2011 showed no evidence of gastroesophageal carcinoma recurrence or metastasis. 2. Status post upper endoscopy 12/17/2010-the very distal portion of the esophagus was ulcerated. The proximal stomach revealed a malignant appearing ulcerative mass in the region of the cardia/gastroesophageal junction. The overall bulk of the lesion was significantly improved but still quite abnormal and large. Multiple biopsies were taken. Pathology was negative for malignancy. 3. Elevated CEA (482) on 09/14/2010. Repeat CEA on 11/25/2010 was normal at 4.7. CEA normal 05/10/2012 at 1.2 4. Dysphagia secondary to the lower esophagus/upper gastric tumor, resolved. 5. Microcytic anemia. The hemoglobin was normal on 03/31/2011. There was persistent red cell microcytosis. The hemoglobin is normal today . 6. Sleep apnea. 7. Early diabetes. 8. History of atrial fibrillation. 9. Hyperlipidemia. 10. History of colon polyps. 11. History of Helicobacter pylori infection in 2011. 12. History of neutropenia secondary to chemotherapy, resolved. 13. CT of the chest on 02/15/2011 with left lower lung density/air bronchograms-? Radiation change.   Disposition:  He remains in clinical remission from the GE junction /gastric cardia adenocarcinoma. We will followup on the CEA from today. He is now greater than 2 years out from diagnosis. He will return for an office visit in 6 months.  Thornton Papas, MD  09/10/2012  10:06 AM

## 2012-09-10 NOTE — Telephone Encounter (Signed)
Gave pt appt for lab and MD on Februaryb 2015

## 2012-11-13 ENCOUNTER — Encounter: Payer: Self-pay | Admitting: Pulmonary Disease

## 2012-11-13 ENCOUNTER — Ambulatory Visit (INDEPENDENT_AMBULATORY_CARE_PROVIDER_SITE_OTHER): Payer: Medicare Other | Admitting: Pulmonary Disease

## 2012-11-13 VITALS — BP 108/70 | HR 55 | Temp 98.4°F | Ht 71.0 in | Wt 228.6 lb

## 2012-11-13 DIAGNOSIS — G4733 Obstructive sleep apnea (adult) (pediatric): Secondary | ICD-10-CM

## 2012-11-13 NOTE — Patient Instructions (Signed)
Continue with cpap, and keep up with mask changes and supplies. Work on weight loss followup with me in one year if doing well.  

## 2012-11-13 NOTE — Progress Notes (Signed)
  Subjective:    Patient ID: Jon [name], male    DOB: 07/06/1945, 67 y.o.   MRN: [id number]  HPI Patient comes in today for followup of his obstructive sleep apnea.  He is wearing CPAP compliance, and is having no issues with his mask or pressure.  He feels that he sleeps well with the device, and is satisfied with his daytime alertness.  He has been keeping up with his mask changes and supplies.  His weight is up 4 pounds from last visit.   Review of Systems  Constitutional: Negative for fever and unexpected weight change.  HENT: Negative for congestion, dental problem, ear pain, nosebleeds, postnasal drip, rhinorrhea, sinus pressure, sneezing, sore throat and trouble swallowing.   Eyes: Negative for redness and itching.  Respiratory: Negative for cough, chest tightness, shortness of breath and wheezing.   Cardiovascular: Negative for palpitations and leg swelling.  Gastrointestinal: Negative for nausea and vomiting.  Genitourinary: Negative for dysuria.  Musculoskeletal: Negative for joint swelling.  Skin: Negative for rash.  Neurological: Negative for headaches.  Hematological: Does not bruise/bleed easily.  Psychiatric/Behavioral: Negative for dysphoric mood. The patient is not nervous/anxious.        Objective:   Physical Exam Overweight male in no acute distress Nose without purulence or discharge noted Neck without lymphadenopathy or thyromegaly No skin breakdown or pressure necrosis from the CPAP mask Lower extremities without edema, no cyanosis Alert and oriented, does not appear to be sleepy, moves all 4 extremities.       Assessment & Plan:

## 2012-11-13 NOTE — Assessment & Plan Note (Signed)
The patient is doing well with CPAP, and reports no issues with his equipment.  He is satisfied with his sleep and daytime alertness.  I have encouraged him to work aggressively on weight loss.

## 2012-12-07 ENCOUNTER — Other Ambulatory Visit: Payer: Self-pay

## 2012-12-07 MED ORDER — OMEPRAZOLE 40 MG PO CPDR: 40.0000 mg | DELAYED_RELEASE_CAPSULE | Freq: Every day | ORAL | Status: DC

## 2012-12-07 MED ORDER — FENOFIBRATE MICRONIZED 130 MG PO CAPS: ORAL_CAPSULE | ORAL | Status: DC

## 2012-12-07 MED ORDER — DILTIAZEM HCL ER COATED BEADS 240 MG PO CP24: ORAL_CAPSULE | ORAL | Status: DC

## 2012-12-07 NOTE — Telephone Encounter (Signed)
Diltiazem, Prilosec, and Fenofibrate were all refilled.

## 2012-12-18 ENCOUNTER — Encounter: Payer: Self-pay | Admitting: Internal Medicine

## 2012-12-18 ENCOUNTER — Ambulatory Visit (INDEPENDENT_AMBULATORY_CARE_PROVIDER_SITE_OTHER): Payer: Medicare Other | Admitting: Internal Medicine

## 2012-12-18 ENCOUNTER — Other Ambulatory Visit (INDEPENDENT_AMBULATORY_CARE_PROVIDER_SITE_OTHER): Payer: Medicare Other

## 2012-12-18 VITALS — BP 120/88 | HR 72 | Temp 98.3°F | Resp 16 | Ht 71.0 in | Wt 227.0 lb

## 2012-12-18 DIAGNOSIS — E119 Type 2 diabetes mellitus without complications: Secondary | ICD-10-CM

## 2012-12-18 DIAGNOSIS — C169 Malignant neoplasm of stomach, unspecified: Secondary | ICD-10-CM

## 2012-12-18 DIAGNOSIS — Z0000 Encounter for general adult medical examination without abnormal findings: Secondary | ICD-10-CM

## 2012-12-18 DIAGNOSIS — Z136 Encounter for screening for cardiovascular disorders: Secondary | ICD-10-CM

## 2012-12-18 DIAGNOSIS — K219 Gastro-esophageal reflux disease without esophagitis: Secondary | ICD-10-CM

## 2012-12-18 DIAGNOSIS — N320 Bladder-neck obstruction: Secondary | ICD-10-CM

## 2012-12-18 DIAGNOSIS — Z23 Encounter for immunization: Secondary | ICD-10-CM

## 2012-12-18 DIAGNOSIS — C160 Malignant neoplasm of cardia: Secondary | ICD-10-CM

## 2012-12-18 LAB — LDL CHOLESTEROL, DIRECT: Direct LDL: 54.1 mg/dL

## 2012-12-18 LAB — URINALYSIS
Bilirubin Urine: NEGATIVE
Hgb urine dipstick: NEGATIVE
Ketones, ur: NEGATIVE
Leukocytes, UA: NEGATIVE
Total Protein, Urine: NEGATIVE
Urine Glucose: NEGATIVE

## 2012-12-18 LAB — LIPID PANEL
HDL: 36 mg/dL — ABNORMAL LOW (ref >39.00)
Total CHOL/HDL Ratio: 4
Triglycerides: 202 mg/dL — ABNORMAL HIGH (ref 0.0–149.0)
VLDL: 40.4 mg/dL — ABNORMAL HIGH (ref 0.0–40.0)

## 2012-12-18 LAB — BASIC METABOLIC PANEL
BUN: 19 mg/dL (ref 6–23)
CO2: 31 mEq/L (ref 19–32)
Chloride: 102 mEq/L (ref 96–112)
Creatinine, Ser: 1.2 mg/dL (ref 0.4–1.5)
Sodium: 137 mEq/L (ref 135–145)

## 2012-12-18 LAB — CBC WITH DIFFERENTIAL/PLATELET
Basophils Absolute: 0 10*3/uL (ref 0.0–0.1)
Basophils Relative: 0.2 % (ref 0.0–3.0)
Eosinophils Absolute: 0.3 10*3/uL (ref 0.0–0.7)
MCHC: 34.1 g/dL (ref 30.0–36.0)
MCV: 85 fl (ref 78.0–100.0)
Monocytes Absolute: 0.7 10*3/uL (ref 0.1–1.0)
Neutro Abs: 3.5 10*3/uL (ref 1.4–7.7)
Neutrophils Relative %: 55 % (ref 43.0–77.0)
RBC: 5.31 Mil/uL (ref 4.22–5.81)
RDW: 15.1 % — ABNORMAL HIGH (ref 11.5–14.6)

## 2012-12-18 LAB — TSH: TSH: 0.57 u[IU]/mL (ref 0.35–5.50)

## 2012-12-18 LAB — HEMOGLOBIN A1C: Hgb A1c MFr Bld: 7.8 % — ABNORMAL HIGH (ref 4.6–6.5)

## 2012-12-18 MED ORDER — METFORMIN HCL 500 MG PO TABS: 500.0000 mg | ORAL_TABLET | Freq: Two times a day (BID) | ORAL | Status: DC

## 2012-12-18 NOTE — Assessment & Plan Note (Signed)
Per Dr Sherrill 

## 2012-12-18 NOTE — Progress Notes (Signed)
Pre visit review using our clinic review tool, if applicable. No additional management support is needed unless otherwise documented below in the visit note. 

## 2012-12-18 NOTE — Assessment & Plan Note (Signed)

## 2012-12-18 NOTE — Assessment & Plan Note (Addendum)
Increase Metformin to bid 

## 2012-12-18 NOTE — Assessment & Plan Note (Signed)
Continue with current prescription therapy as reflected on the Med list.  

## 2012-12-18 NOTE — Progress Notes (Signed)
Subjective:    HPI  The patient is here for a wellness exam.   The patient presents for a follow-up of  chronic hypertension, chronic dyslipidemia controlled with medicines. 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 No wt loss per pt - good appetite  Wt Readings from Last 3 Encounters:  12/18/12 227 lb (102.967 kg)  11/13/12 228 lb 9.6 oz (103.692 kg)  09/10/12 226 lb 12.8 oz (102.876 kg)   BP Readings from Last 3 Encounters:  12/18/12 120/88  11/13/12 108/70  09/10/12 129/76     Review of Systems  Constitutional: Negative for appetite change, fatigue and unexpected weight change.  HENT: Negative for congestion, nosebleeds, sneezing, sore throat and trouble swallowing.   Eyes: Negative for itching and visual disturbance.  Respiratory: Negative for cough.   Cardiovascular: Negative for chest pain, palpitations and leg swelling.  Gastrointestinal: Negative for nausea, diarrhea, blood in stool and abdominal distention.  Genitourinary: Negative for frequency and hematuria.  Musculoskeletal: Negative for back pain, gait problem, joint swelling and neck pain.  Skin: Negative for rash.  Neurological: Negative for dizziness, tremors, speech difficulty and weakness.  Psychiatric/Behavioral: Negative for sleep disturbance, dysphoric mood and agitation. The patient is not nervous/anxious.         Objective:   Physical Exam  Constitutional: He is oriented to person, place, and time. He appears well-developed.  HENT:  Mouth/Throat: Oropharynx is clear and moist.  Eyes: Conjunctivae are normal. Pupils are equal, round, and reactive to light.  Neck: Normal range of motion. No JVD present. No thyromegaly present.  Cardiovascular: Normal rate, regular rhythm, normal heart sounds and intact distal pulses.  Exam reveals no gallop and no friction rub.   No murmur heard. Pulmonary/Chest: Effort normal and breath sounds normal. No respiratory distress. He has no wheezes. He  has no rales. He exhibits no tenderness.  Abdominal: Soft. Bowel sounds are normal. He exhibits no distension and no mass. There is no tenderness. There is no rebound and no guarding.  Musculoskeletal: Normal range of motion. He exhibits no edema and no tenderness.  Lymphadenopathy:    He has no cervical adenopathy.  Neurological: He is alert and oriented to person, place, and time. He has normal reflexes. No cranial nerve deficit. He exhibits normal muscle tone. Coordination normal.  Skin: Skin is warm and dry. No rash noted.  Psychiatric: He has a normal mood and affect. His behavior is normal. Judgment and thought content normal.     Lab Results  Component Value Date   WBC 5.4 09/10/2012   HGB 15.3 09/10/2012   HCT 44.4 09/10/2012   PLT 238 09/10/2012   GLUCOSE 139* 08/16/2012   CHOL 140 07/06/2011   TRIG 263.0* 07/06/2011   HDL 39.60 07/06/2011   LDLDIRECT 50.9 07/06/2011   LDLCALC 63 12/22/2009   ALT 31 08/16/2012   AST 26 08/16/2012   NA 136 08/16/2012   K 3.9 08/16/2012   CL 100 08/16/2012   CREATININE 1.3 08/16/2012   BUN 15 08/16/2012   CO2 27 08/16/2012   TSH 0.66 07/06/2011   PSA 0.92 07/06/2011   HGBA1C 7.8* 08/16/2012       CT 1/13 CHEST  Findings: On the lung window images there is parenchymal opacity  within the posterior medial left lower lobe with air bronchograms.  This most likely represents pneumonia. If the patient has  undergone radiation, then this could represent radiation fibrosis.  The right lung is clear. No suspicious lung  nodule is seen. No  pleural effusion is noted.  On soft tissue window images, there is a low attenuation nodule in  the lower pole of the right lobe of thyroid posterior medially of  approximately 16 mm in diameter. Ultrasound of the thyroid could  be performed if necessary clinically. The thoracic aorta opacifies  with no complicating features and the origins of the great vessels  are patent. There are coronary artery calcifications noted. The   pulmonary arteries opacify less well, but no significant  abnormality is seen. No mediastinal or hilar adenopathy is noted.  There is very minimal soft tissue prominence at the  gastroesophageal junction which is of questionable significance.  No definite mass is seen.  IMPRESSION:  1. Very minimal soft tissue prominence at the gastroesophageal  junction. No definite mass.  2. Parenchymal opacity in the left lower lobe. Probable  pneumonia. Correlate clinically.  3. Right lower lobe thyroid nodule of 16 mm in diameter.  4. Coronary artery calcifications.  CT ABDOMEN  Findings: Somewhat prominent soft tissue is again noted at the  gastroesophageal junction, but on sagittal and coronal images is  less impressive than on axial scans. No definite gastroesophageal  mass is seen. The liver enhances with minimal inhomogeneity and no  focal abnormality. No ductal dilatation is seen. No calcified  gallstones are seen. The pancreas is normal in size and the  pancreatic duct is not dilated. The adrenal glands and spleen are  unremarkable. The kidneys enhance and multiple right renal cysts  again are noted with a left lower pole renal cyst stable as well.  The abdominal aorta is normal in caliber. No adenopathy is seen.  The origins of the celiac axis, SMA, renal arteries and IMA are  patent. No bony abnormality is seen.  IMPRESSION:  1. No definite gastroesophageal mass is evident on sagittal and  coronal images.  2. Stable renal cysts.  Original Report Authenticated By: Juline Patch, M.D.        Assessment & Plan:

## 2012-12-18 NOTE — Assessment & Plan Note (Signed)
CEA/CT

## 2012-12-19 ENCOUNTER — Other Ambulatory Visit: Payer: Self-pay | Admitting: *Deleted

## 2012-12-19 DIAGNOSIS — E119 Type 2 diabetes mellitus without complications: Secondary | ICD-10-CM

## 2013-01-04 ENCOUNTER — Telehealth: Payer: Self-pay | Admitting: Internal Medicine

## 2013-01-04 NOTE — Telephone Encounter (Signed)
Patient Information:  Caller Name: Cristina  Phone: [phone]  Patient: Jon [name]  Gender: Male  DOB: [birth year]  Age: 67 Years  PCP: Plotnikov, Alex (Adults only)  Office Follow Up:  Does the office need to follow up with this patient?: Yes  Instructions For The Office: No appt available at the office .  Please contact to assist with appt. Care advice provided and call back parameters  RN Note:  No appt available at the office .  Please contact to assist with appt. Care advice provided and call back parameters  Symptoms  Reason For Call & Symptoms: Patient states he is having pain in right hip and pain goes down the front and side of right leg and stops above the ankle.  Onset Thanksgiving 12/27/12 and worsened over the last week . He believes this is sciatic because he has had it on his left side.  He states no comfortable position. During the day he feels fine. Evening worsens.  Denies injury. In office on 12/18/12 and Metformin dosage increased.  Reviewed Health History In EMR: Yes  Reviewed Medications In EMR: Yes  Reviewed Allergies In EMR: Yes  Reviewed Surgeries / Procedures: Yes  Date of Onset of Symptoms: 12/27/2012  Guideline(s) Used:  Leg Pain  Disposition Per Guideline:   See Within 3 Days in Office  Reason For Disposition Reached:   Moderate pain (e.g., interferes with normal activities, limping) and present > 3 days  Advice Given:  Pain Medicines:  For pain relief, you can take either acetaminophen, ibuprofen, or naproxen.  They are over-the-counter (OTC) pain drugs. You can buy them at the drugstore.  Ibuprofen (e.g., Motrin, Advil):  Another choice is to take 600 mg (three 200 mg pills) by mouth every 8 hours.  Call Back If:  Moderate pain (e.g., limping) lasts more than 3 days  Signs of infection occur (e.g., spreading redness, warmth, fever)  You become worse.  Local Heat  (Bathtub option): If stiffness lasts more than 48 hours, relax in a hot  bath for 20 minutes twice a day and gently exercise the involved part under water.  Apply Heat to the Area:   Beginning 48 hours after an injury, apply a warm washcloth or heating pad for 10 minutes 3 times a day.  This will help increase blood flow and improve healing.  RN Overrode Recommendation:  Make Appointment  No appt available at the office .  Please contact to assist with appt. Care advice provided and call back parameters

## 2013-01-06 NOTE — Telephone Encounter (Signed)
OV next wk w/any provider Thx

## 2013-01-07 NOTE — Telephone Encounter (Signed)
Pt scheduled appoint 12.9.14 @ 4:15 w/Plotnikov

## 2013-01-08 ENCOUNTER — Encounter: Payer: Self-pay | Admitting: Internal Medicine

## 2013-01-08 ENCOUNTER — Ambulatory Visit (INDEPENDENT_AMBULATORY_CARE_PROVIDER_SITE_OTHER): Payer: Medicare Other | Admitting: Internal Medicine

## 2013-01-08 VITALS — BP 140/80 | HR 80 | Temp 96.9°F | Resp 16 | Wt 216.0 lb

## 2013-01-08 DIAGNOSIS — E119 Type 2 diabetes mellitus without complications: Secondary | ICD-10-CM

## 2013-01-08 DIAGNOSIS — M5431 Sciatica, right side: Secondary | ICD-10-CM

## 2013-01-08 DIAGNOSIS — M5430 Sciatica, unspecified side: Secondary | ICD-10-CM

## 2013-01-08 DIAGNOSIS — G5701 Lesion of sciatic nerve, right lower limb: Secondary | ICD-10-CM | POA: Insufficient documentation

## 2013-01-08 DIAGNOSIS — G5700 Lesion of sciatic nerve, unspecified lower limb: Secondary | ICD-10-CM

## 2013-01-08 MED ORDER — TRAMADOL HCL 50 MG PO TABS: 50.0000 mg | ORAL_TABLET | Freq: Two times a day (BID) | ORAL | Status: DC | PRN

## 2013-01-08 MED ORDER — PREDNISONE 10 MG PO TABS: ORAL_TABLET | ORAL | Status: DC

## 2013-01-08 NOTE — Progress Notes (Signed)
Subjective:    HPI  C/o severe pain in R hip/buttock irrad down R leg and dorsal foot x 10 days> Not better. Ibuprofen helped a little...   The patient presents for a follow-up of  chronic hypertension, chronic dyslipidemia controlled with medicines. 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 No wt loss per pt - good appetite  Wt Readings from Last 3 Encounters:  01/08/13 216 lb (97.977 kg)  12/18/12 227 lb (102.967 kg)  11/13/12 228 lb 9.6 oz (103.692 kg)   BP Readings from Last 3 Encounters:  01/08/13 140/80  12/18/12 120/88  11/13/12 108/70     Review of Systems  Constitutional: Negative for appetite change, fatigue and unexpected weight change.  HENT: Negative for congestion, nosebleeds, sneezing, sore throat and trouble swallowing.   Eyes: Negative for itching and visual disturbance.  Respiratory: Negative for cough.   Cardiovascular: Negative for chest pain, palpitations and leg swelling.  Gastrointestinal: Negative for nausea, diarrhea, blood in stool and abdominal distention.  Genitourinary: Negative for frequency and hematuria.  Musculoskeletal: Negative for back pain, gait problem, joint swelling and neck pain.  Skin: Negative for rash.  Neurological: Negative for dizziness, tremors, speech difficulty and weakness.  Psychiatric/Behavioral: Negative for sleep disturbance, dysphoric mood and agitation. The patient is not nervous/anxious.         Objective:   Physical Exam  Constitutional: He is oriented to person, place, and time. He appears well-developed.  HENT:  Mouth/Throat: Oropharynx is clear and moist.  Eyes: Conjunctivae are normal. Pupils are equal, round, and reactive to light.  Neck: Normal range of motion. No JVD present. No thyromegaly present.  Cardiovascular: Normal rate, regular rhythm, normal heart sounds and intact distal pulses.  Exam reveals no gallop and no friction rub.   No murmur heard. Pulmonary/Chest: Effort normal and  breath sounds normal. No respiratory distress. He has no wheezes. He has no rales. He exhibits no tenderness.  Abdominal: Soft. Bowel sounds are normal. He exhibits no distension and no mass. There is no tenderness. There is no rebound and no guarding.  Musculoskeletal: Normal range of motion. He exhibits no edema and no tenderness.  Lymphadenopathy:    He has no cervical adenopathy.  Neurological: He is alert and oriented to person, place, and time. He has normal reflexes. No cranial nerve deficit. He exhibits normal muscle tone. Coordination normal.  Skin: Skin is warm and dry. No rash noted.  Psychiatric: He has a normal mood and affect. His behavior is normal. Judgment and thought content normal.   R buttock is tender Strait leg elev is neg B MS/DTRs WNL B No rash  Lab Results  Component Value Date   WBC 6.4 12/18/2012   HGB 15.4 12/18/2012   HCT 45.1 12/18/2012   PLT 260.0 12/18/2012   GLUCOSE 146* 12/18/2012   CHOL 141 12/18/2012   TRIG 202.0* 12/18/2012   HDL 36.00* 12/18/2012   LDLDIRECT 54.1 12/18/2012   LDLCALC 63 12/22/2009   ALT 31 08/16/2012   AST 26 08/16/2012   NA 137 12/18/2012   K 4.6 12/18/2012   CL 102 12/18/2012   CREATININE 1.2 12/18/2012   BUN 19 12/18/2012   CO2 31 12/18/2012   TSH 0.57 12/18/2012   PSA 0.96 12/18/2012   HGBA1C 7.8* 12/18/2012       CT 1/13 CHEST  Findings: On the lung window images there is parenchymal opacity  within the posterior medial left lower lobe with air bronchograms.  This  most likely represents pneumonia. If the patient has  undergone radiation, then this could represent radiation fibrosis.  The right lung is clear. No suspicious lung nodule is seen. No  pleural effusion is noted.  On soft tissue window images, there is a low attenuation nodule in  the lower pole of the right lobe of thyroid posterior medially of  approximately 16 mm in diameter. Ultrasound of the thyroid could  be performed if necessary clinically.  The thoracic aorta opacifies  with no complicating features and the origins of the great vessels  are patent. There are coronary artery calcifications noted. The  pulmonary arteries opacify less well, but no significant  abnormality is seen. No mediastinal or hilar adenopathy is noted.  There is very minimal soft tissue prominence at the  gastroesophageal junction which is of questionable significance.  No definite mass is seen.  IMPRESSION:  1. Very minimal soft tissue prominence at the gastroesophageal  junction. No definite mass.  2. Parenchymal opacity in the left lower lobe. Probable  pneumonia. Correlate clinically.  3. Right lower lobe thyroid nodule of 16 mm in diameter.  4. Coronary artery calcifications.  CT ABDOMEN  Findings: Somewhat prominent soft tissue is again noted at the  gastroesophageal junction, but on sagittal and coronal images is  less impressive than on axial scans. No definite gastroesophageal  mass is seen. The liver enhances with minimal inhomogeneity and no  focal abnormality. No ductal dilatation is seen. No calcified  gallstones are seen. The pancreas is normal in size and the  pancreatic duct is not dilated. The adrenal glands and spleen are  unremarkable. The kidneys enhance and multiple right renal cysts  again are noted with a left lower pole renal cyst stable as well.  The abdominal aorta is normal in caliber. No adenopathy is seen.  The origins of the celiac axis, SMA, renal arteries and IMA are  patent. No bony abnormality is seen.  IMPRESSION:  1. No definite gastroesophageal mass is evident on sagittal and  coronal images.  2. Stable renal cysts.  Original Report Authenticated By: Juline Patch, M.D.        Assessment & Plan:

## 2013-01-08 NOTE — Patient Instructions (Addendum)
Exercises for piriformis syndrome

## 2013-01-08 NOTE — Assessment & Plan Note (Signed)
Continue with current prescription therapy as reflected on the Med list.  

## 2013-01-08 NOTE — Assessment & Plan Note (Addendum)
12/14 severe Exercises for piriformis syndrome Prednisone 10 mg: take 4 tabs a day x 3 days; then 3 tabs a day x 4 days; then 2 tabs a day x 4 days, then 1 tab a day x 6 days, then stop. Take pc. Tramadol prn  Potential benefits of a short term steroid and opioid  use as well as potential risks  and complications were explained to the patient and were aknowledged.  Imaging if not better

## 2013-01-08 NOTE — Progress Notes (Signed)
Pre visit review using our clinic review tool, if applicable. No additional management support is needed unless otherwise documented below in the visit note. 

## 2013-02-05 ENCOUNTER — Encounter: Payer: Self-pay | Admitting: Internal Medicine

## 2013-02-15 ENCOUNTER — Telehealth: Payer: Self-pay | Admitting: *Deleted

## 2013-02-15 NOTE — Telephone Encounter (Signed)
Ok to switch Thx

## 2013-02-15 NOTE — Telephone Encounter (Signed)
Fenofibrate is not covered by ins. Fenofibric 135 mg is covered. Can we change rx?

## 2013-02-18 MED ORDER — FENOFIBRIC ACID 135 MG PO CPDR: 1.0000 | DELAYED_RELEASE_CAPSULE | Freq: Every day | ORAL | Status: DC

## 2013-02-18 NOTE — Telephone Encounter (Signed)
New Rx sent. Left detailed mess informing pt.

## 2013-02-25 ENCOUNTER — Encounter: Payer: Self-pay | Admitting: Internal Medicine

## 2013-03-13 ENCOUNTER — Ambulatory Visit (AMBULATORY_SURGERY_CENTER): Payer: Self-pay | Admitting: *Deleted

## 2013-03-13 VITALS — Ht 71.0 in | Wt 226.4 lb

## 2013-03-13 DIAGNOSIS — Z86010 Personal history of colonic polyps: Secondary | ICD-10-CM

## 2013-03-13 MED ORDER — MOVIPREP 100 G PO SOLR: ORAL | Status: DC

## 2013-03-13 NOTE — Progress Notes (Signed)
Patient denies any allergies to eggs or soy. Patient denies any problems with anesthesia.  

## 2013-03-15 ENCOUNTER — Other Ambulatory Visit (HOSPITAL_BASED_OUTPATIENT_CLINIC_OR_DEPARTMENT_OTHER): Payer: Medicare Other

## 2013-03-15 ENCOUNTER — Ambulatory Visit (HOSPITAL_BASED_OUTPATIENT_CLINIC_OR_DEPARTMENT_OTHER): Payer: Medicare Other | Admitting: Oncology

## 2013-03-15 ENCOUNTER — Telehealth: Payer: Self-pay | Admitting: Oncology

## 2013-03-15 VITALS — BP 121/87 | HR 59 | Temp 98.4°F | Resp 18 | Ht 71.0 in | Wt 224.4 lb

## 2013-03-15 DIAGNOSIS — D509 Iron deficiency anemia, unspecified: Secondary | ICD-10-CM

## 2013-03-15 DIAGNOSIS — C169 Malignant neoplasm of stomach, unspecified: Secondary | ICD-10-CM

## 2013-03-15 DIAGNOSIS — C160 Malignant neoplasm of cardia: Secondary | ICD-10-CM

## 2013-03-15 DIAGNOSIS — E119 Type 2 diabetes mellitus without complications: Secondary | ICD-10-CM

## 2013-03-15 DIAGNOSIS — D649 Anemia, unspecified: Secondary | ICD-10-CM

## 2013-03-15 DIAGNOSIS — I4891 Unspecified atrial fibrillation: Secondary | ICD-10-CM

## 2013-03-15 LAB — CBC WITH DIFFERENTIAL/PLATELET
BASO%: 0.4 % (ref 0.0–2.0)
Basophils Absolute: 0 10*3/uL (ref 0.0–0.1)
EOS%: 3.8 % (ref 0.0–7.0)
Eosinophils Absolute: 0.3 10*3/uL (ref 0.0–0.5)
HCT: 45.7 % (ref 38.4–49.9)
HGB: 15.2 g/dL (ref 13.0–17.1)
LYMPH#: 2.2 10*3/uL (ref 0.9–3.3)
LYMPH%: 32.1 % (ref 14.0–49.0)
MCH: 28.4 pg (ref 27.2–33.4)
MCHC: 33.2 g/dL (ref 32.0–36.0)
MCV: 85.7 fL (ref 79.3–98.0)
MONO#: 0.9 10*3/uL (ref 0.1–0.9)
MONO%: 13.4 % (ref 0.0–14.0)
NEUT#: 3.4 10*3/uL (ref 1.5–6.5)
NEUT%: 50.3 % (ref 39.0–75.0)
Platelets: 283 10*3/uL (ref 140–400)
RBC: 5.33 10*6/uL (ref 4.20–5.82)
RDW: 14.6 % (ref 11.0–14.6)
WBC: 6.8 10*3/uL (ref 4.0–10.3)

## 2013-03-15 NOTE — Progress Notes (Signed)
   Jon [name]    OFFICE PROGRESS NOTE   INTERVAL HISTORY:   He returns for scheduled followup of esophagus cancer. He feels well. Good appetite and energy level. No dysphagia. No complaint. He is scheduled for colonoscopy later this month. Objective:  Vital signs in last 24 hours:  Blood pressure 121/87, pulse 59, temperature 98.4 F (36.9 C), temperature source Oral, resp. rate 18, height 5\' 11"  (1.803 m), weight 224 lb 6.4 oz (101.787 kg), SpO2 98.00%.    HEENT: Neck without mass Lymphatics: No cervical, supraclavicular, or axillary nodes Resp: Decreased breath sounds with end inspiratory coarse rhonchi at the left base, no respiratory distress Cardio: Regular rate and rhythm GI: No hepatosplenomegaly, nontender, no mass Vascular: No leg edema   Lab Results:  Lab Results  Component Value Date   WBC 6.8 03/15/2013   HGB 15.2 03/15/2013   HCT 45.7 03/15/2013   MCV 85.7 03/15/2013   PLT 283 03/15/2013   NEUTROABS 3.4 03/15/2013   CEA on 09/10/2012-1.1   Medications: I have reviewed the patient's current medications.  Assessment/Plan: 1. 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 Staging CT scan 08/27/2010 revealed small gastrohepatic ligament, periportal and retrocrural lymph nodes without other evidence of metastatic disease. Staging PET scan on 09/21/2010 showed abnormal malignant range FDG uptake at the site of the biopsy-proven gastric cancer. Small perigastric lymph nodes were not FDG avid above the background level. The radiologist commented that this may reflect a benign etiology or a false-negative appearance due to the small lymph node size. There was no evidence for distant metastatic disease. The CEA was markedly elevated at 482 on 09/14/2010. He completed radiation 09/20/2010 through 10/29/2010. He received concurrent weekly Taxol/carboplatin chemotherapy  09/22/2010 through 10/27/2010. Restaging PET scan 11/22/2010 showed interval resolution of previously demonstrated hypermetabolic activity within the proximal stomach. There was no evidence of metastatic disease. Restaging PET scan 05/10/2011 showed no evidence of gastroesophageal carcinoma recurrence or metastasis. 2. Status post upper endoscopy 12/17/2010-the very distal portion of the esophagus was ulcerated. The proximal stomach revealed a malignant appearing ulcerative mass in the region of the cardia/gastroesophageal junction. The overall bulk of the lesion was significantly improved but still quite abnormal and large. Multiple biopsies were taken. Pathology was negative for malignancy. 3. Elevated CEA (482) on 09/14/2010. Repeat CEA on 09/10/2012-1.1 4. Dysphagia secondary to the lower esophagus/upper gastric tumor, resolved. 5. Microcytic anemia. The hemoglobin was normal on 03/31/2011. There was persistent red cell microcytosis. The hemoglobin is normal today . 6. Sleep apnea. 7. Early diabetes. 8. History of atrial fibrillation. 9. Hyperlipidemia. 10. History of colon polyps. 11. History of Helicobacter pylori infection in 2011. 12. History of neutropenia secondary to chemotherapy, resolved. 13. CT of the chest on 02/15/2011 with left lower lung density/air bronchograms-? Radiation change.   Disposition:  Jon [name] remains in clinical remission from esophagus cancer. We will followup on the CEA from today. He will return for an office and lab visit in 6 months.   Betsy Coder, MD  03/15/2013  3:42 PM

## 2013-03-15 NOTE — Telephone Encounter (Signed)
gv adn printed appt sched anda vs for pt for Aug °

## 2013-03-16 LAB — CEA: CEA: 1.6 ng/mL (ref 0.0–5.0)

## 2013-03-18 ENCOUNTER — Telehealth: Payer: Self-pay | Admitting: *Deleted

## 2013-03-18 ENCOUNTER — Telehealth: Payer: Self-pay | Admitting: Internal Medicine

## 2013-03-18 NOTE — Telephone Encounter (Signed)
Told patient I would leave a free Moviprep voucher up front for him to pick up.  Patient agreed

## 2013-03-18 NOTE — Telephone Encounter (Signed)
Left VM to call back re: labs  

## 2013-03-18 NOTE — Telephone Encounter (Signed)
Made aware that CEA is normal.

## 2013-03-22 ENCOUNTER — Encounter: Payer: Self-pay | Admitting: Internal Medicine

## 2013-03-27 ENCOUNTER — Ambulatory Visit (AMBULATORY_SURGERY_CENTER): Payer: Medicare Other | Admitting: Internal Medicine

## 2013-03-27 ENCOUNTER — Encounter: Payer: Self-pay | Admitting: Internal Medicine

## 2013-03-27 VITALS — BP 122/69 | HR 52 | Temp 98.4°F | Resp 25 | Ht 71.0 in | Wt 226.0 lb

## 2013-03-27 DIAGNOSIS — Z860100 Personal history of colon polyps, unspecified: Secondary | ICD-10-CM

## 2013-03-27 DIAGNOSIS — D126 Benign neoplasm of colon, unspecified: Secondary | ICD-10-CM

## 2013-03-27 LAB — GLUCOSE, CAPILLARY
Glucose-Capillary: 113 mg/dL — ABNORMAL HIGH (ref 70–99)
Glucose-Capillary: 103 mg/dL — ABNORMAL HIGH (ref 70–99)

## 2013-03-27 MED ORDER — SODIUM CHLORIDE 0.9 % IV SOLN: 500.0000 mL | INTRAVENOUS | Status: DC

## 2013-03-27 NOTE — Patient Instructions (Signed)
YOU HAD AN ENDOSCOPIC PROCEDURE TODAY AT THE [facility]: Refer to the procedure report that was given to you for any specific questions about what was found during the examination.  If the procedure report does not answer your questions, please call your gastroenterologist to clarify.  If you requested that your care partner not be given the details of your procedure findings, then the procedure report has been included in a sealed envelope for you to review at your convenience later.  YOU SHOULD EXPECT: Some feelings of bloating in the abdomen. Passage of more gas than usual.  Walking can help get rid of the air that was put into your GI tract during the procedure and reduce the bloating. If you had a lower endoscopy (such as a colonoscopy or flexible sigmoidoscopy) you may notice spotting of blood in your stool or on the toilet paper. If you underwent a bowel prep for your procedure, then you may not have a normal bowel movement for a few days.  DIET: Your first meal following the procedure should be a light meal and then it is ok to progress to your normal diet.  A half-sandwich or bowl of soup is an example of a good first meal.  Heavy or fried foods are harder to digest and may make you feel nauseous or bloated.  Likewise meals heavy in dairy and vegetables can cause extra gas to form and this can also increase the bloating.  Drink plenty of fluids but you should avoid alcoholic beverages for 24 hours.  ACTIVITY: Your care partner should take you home directly after the procedure.  You should plan to take it easy, moving slowly for the rest of the day.  You can resume normal activity the day after the procedure however you should NOT DRIVE or use heavy machinery for 24 hours (because of the sedation medicines used during the test).    SYMPTOMS TO REPORT IMMEDIATELY: A gastroenterologist can be reached at any hour.  During normal business hours, 8:30 AM to 5:00 PM Monday through Friday,  call [phone].  After hours and on weekends, please call the GI answering service at [phone] who will take a message and have the physician on call contact you.   Following lower endoscopy (colonoscopy or flexible sigmoidoscopy):  Excessive amounts of blood in the stool  Significant tenderness or worsening of abdominal pains  Swelling of the abdomen that is new, acute  Fever of 100F or higher    FOLLOW UP: If any biopsies were taken you will be contacted by phone or by letter within the next 1-3 weeks.  Call your gastroenterologist if you have not heard about the biopsies in 3 weeks.  Our staff will call the home number listed on your records the next business day following your procedure to check on you and address any questions or concerns that you may have at that time regarding the information given to you following your procedure. This is a courtesy call and so if there is no answer at the home number and we have not heard from you through the emergency physician on call, we will assume that you have returned to your regular daily activities without incident.  SIGNATURES/CONFIDENTIALITY: You and/or your care partner have signed paperwork which will be entered into your electronic medical record.  These signatures attest to the fact that that the information above on your After Visit Summary has been reviewed and is understood.  Full responsibility of the confidentiality   this discharge information lies with you and/or your care-partner.   Information on polyps and diverticulosis given to you today 

## 2013-03-27 NOTE — Progress Notes (Signed)
Called to room to assist during endoscopic procedure.  Patient ID and intended procedure confirmed with present staff. Received instructions for my participation in the procedure from the performing physician.  

## 2013-03-27 NOTE — Progress Notes (Signed)
No complaints noted in the recovery room. Maw   

## 2013-03-27 NOTE — Op Note (Signed)
[name]  [address] [place], [id number]   COLONOSCOPY PROCEDURE REPORT  [name]: Reason, Jon  MR#: [id number] BIRTHDATE: 1945-09-12 , 67  yrs. old GENDER: Male ENDOSCOPIST: Eustace Quail, MD REFERRED PX:[id number] Program Recall PROCEDURE DATE:  03/27/2013 PROCEDURE:   Colonoscopy with snare polypectomy x 2 First Screening Colonoscopy - Avg.  risk and is 50 yrs.  old or older - No.  Prior Negative Screening - Now for repeat screening. N/A  History of Adenoma - Now for follow-up colonoscopy & has been > or = to 3 yrs.  Yes hx of adenoma.  Has been 3 or more years since last colonoscopy.  Polyps Removed Today? Yes. ASA CLASS:   Class III INDICATIONS:[name]'s personal history of adenomatous colon polyps right disease November 2004 3 adenomas; January 2010 F/U. MEDICATIONS: MAC sedation, administered by CRNA and propofol (Diprivan) 250mg  IV DESCRIPTION OF PROCEDURE:   After Jon risks benefits and alternatives of Jon procedure were thoroughly explained, informed consent was obtained.  A digital rectal exam revealed no abnormalities of Jon rectum.   Jon LB [id number]  endoscope was introduced through Jon anus and advanced to Jon cecum, which was identified by both Jon appendix and ileocecal valve. No adverse events experienced.   Jon quality of Jon prep was excellent, using MoviPrep  Jon instrument was then slowly withdrawn as Jon colon was fully examined.  COLON FINDINGS: Mild melanosis was found throughout Jon entire examined colon.   Two diminutive polyps were found in Jon descending colon.  A polypectomy was performed with a cold snare. Jon resection was complete and Jon polyp tissue was completely retrieved.   Moderate diverticulosis was noted Jon finding was in Jon left colon.   Jon colon mucosa was otherwise normal. Retroflexed views revealed internal hemorrhoids. Jon time to cecum=6 minutes 55 seconds.  Withdrawal time=13 minutes 30  seconds. Jon scope was withdrawn and Jon procedure completed. COMPLICATIONS: There were no complications. ENDOSCOPIC IMPRESSION: 1.   Mild melanosis was found throughout Jon entire examined colon 2.   Two diminutive polyps were found in Jon descending colon; polypectomy was performed with a cold snare 3.   Moderate diverticulosis was noted in Jon left colon 4.   Jon colon mucosa was otherwise normal RECOMMENDATIONS: 1.  Follow up colonoscopy in 5 years   eSigned:  Eustace Quail, MD 03/27/2013 11:56 AM   cc: [place].  Plotnikov, MD, Kavin Leech, MD, and Jon [name]

## 2013-03-29 ENCOUNTER — Telehealth: Payer: Self-pay | Admitting: *Deleted

## 2013-03-29 NOTE — Telephone Encounter (Signed)
  Follow up Call-  Call back number 03/27/2013 12/17/2010 08/25/2010  Post procedure Call Back phone  # [phone] OK TO LEAVE MESSAGE [phone]  Permission to leave phone message Yes - -     Patient questions:  Do you have a fever, pain , or abdominal swelling? no Pain Score  0 *  Have you tolerated food without any problems? yes  Have you been able to return to your normal activities? yes  Do you have any questions about your discharge instructions: Diet   no Medications  no Follow up visit  no  Do you have questions or concerns about your Care? no  Actions: * If pain score is 4 or above: No action needed, pain <4.

## 2013-04-03 ENCOUNTER — Encounter: Payer: Self-pay | Admitting: Internal Medicine

## 2013-05-31 ENCOUNTER — Encounter: Payer: Self-pay | Admitting: Internal Medicine

## 2013-06-06 ENCOUNTER — Other Ambulatory Visit: Payer: Self-pay | Admitting: Internal Medicine

## 2013-06-18 ENCOUNTER — Encounter: Payer: Self-pay | Admitting: Internal Medicine

## 2013-06-18 ENCOUNTER — Ambulatory Visit (INDEPENDENT_AMBULATORY_CARE_PROVIDER_SITE_OTHER): Payer: Medicare Other | Admitting: Internal Medicine

## 2013-06-18 ENCOUNTER — Other Ambulatory Visit (INDEPENDENT_AMBULATORY_CARE_PROVIDER_SITE_OTHER): Payer: Medicare Other

## 2013-06-18 VITALS — BP 152/88 | HR 72 | Temp 98.2°F | Resp 16 | Wt 223.0 lb

## 2013-06-18 DIAGNOSIS — K219 Gastro-esophageal reflux disease without esophagitis: Secondary | ICD-10-CM

## 2013-06-18 DIAGNOSIS — E119 Type 2 diabetes mellitus without complications: Secondary | ICD-10-CM

## 2013-06-18 DIAGNOSIS — Z8679 Personal history of other diseases of the circulatory system: Secondary | ICD-10-CM

## 2013-06-18 DIAGNOSIS — C169 Malignant neoplasm of stomach, unspecified: Secondary | ICD-10-CM

## 2013-06-18 DIAGNOSIS — C160 Malignant neoplasm of cardia: Secondary | ICD-10-CM

## 2013-06-18 DIAGNOSIS — N529 Male erectile dysfunction, unspecified: Secondary | ICD-10-CM | POA: Insufficient documentation

## 2013-06-18 LAB — BASIC METABOLIC PANEL
BUN: 20 mg/dL (ref 6–23)
CHLORIDE: 104 meq/L (ref 96–112)
CO2: 27 mEq/L (ref 19–32)
Calcium: 9.9 mg/dL (ref 8.4–10.5)
Creatinine, Ser: 1.4 mg/dL (ref 0.4–1.5)
GFR: 63.24 mL/min (ref >60.00)
Glucose, Bld: 127 mg/dL — ABNORMAL HIGH (ref 70–99)
Potassium: 4.1 mEq/L (ref 3.5–5.1)
Sodium: 139 mEq/L (ref 135–145)

## 2013-06-18 LAB — HEPATIC FUNCTION PANEL
ALT: 26 U/L (ref 0–53)
AST: 29 U/L (ref 0–37)
Albumin: 4.4 g/dL (ref 3.5–5.2)
Alkaline Phosphatase: 64 U/L (ref 39–117)
BILIRUBIN DIRECT: 0 mg/dL (ref 0.0–0.3)
BILIRUBIN TOTAL: 0.3 mg/dL (ref 0.2–1.2)
Total Protein: 7.1 g/dL (ref 6.0–8.3)

## 2013-06-18 LAB — TSH: TSH: 0.42 u[IU]/mL (ref 0.35–4.50)

## 2013-06-18 LAB — HEMOGLOBIN A1C: Hgb A1c MFr Bld: 7.7 % — ABNORMAL HIGH (ref 4.6–6.5)

## 2013-06-18 MED ORDER — AVANAFIL 100 MG PO TABS: 100.0000 mg | ORAL_TABLET | Freq: Every day | ORAL | Status: DC | PRN

## 2013-06-18 NOTE — Assessment & Plan Note (Signed)
Wt Readings from Last 3 Encounters:  06/18/13 223 lb (101.152 kg)  03/27/13 226 lb (102.513 kg)  03/15/13 224 lb 6.4 oz (101.787 kg)

## 2013-06-18 NOTE — Assessment & Plan Note (Signed)
Doing well on Rx.

## 2013-06-18 NOTE — Progress Notes (Signed)
Pre visit review using our clinic review tool, if applicable. No additional management support is needed unless otherwise documented below in the visit note. 

## 2013-06-18 NOTE — Progress Notes (Signed)
Subjective:    HPI    The patient presents for a follow-up of  chronic hypertension, chronic dyslipidemia controlled with medicines. 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 No wt loss per pt - good appetite C/o ED  Wt Readings from Last 3 Encounters:  06/18/13 223 lb (101.152 kg)  03/27/13 226 lb (102.513 kg)  03/15/13 224 lb 6.4 oz (101.787 kg)   BP Readings from Last 3 Encounters:  06/18/13 152/88  03/27/13 122/69  03/15/13 121/87     Review of Systems  Constitutional: Negative for appetite change, fatigue and unexpected weight change.  HENT: Negative for congestion, nosebleeds, sneezing, sore throat and trouble swallowing.   Eyes: Negative for itching and visual disturbance.  Respiratory: Negative for cough.   Cardiovascular: Negative for chest pain, palpitations and leg swelling.  Gastrointestinal: Negative for nausea, diarrhea, blood in stool and abdominal distention.  Genitourinary: Negative for frequency and hematuria.  Musculoskeletal: Negative for back pain, gait problem, joint swelling and neck pain.  Skin: Negative for rash.  Neurological: Negative for dizziness, tremors, speech difficulty and weakness.  Psychiatric/Behavioral: Negative for sleep disturbance, dysphoric mood and agitation. The patient is not nervous/anxious.         Objective:   Physical Exam  Constitutional: He is oriented to person, place, and time. He appears well-developed.  HENT:  Mouth/Throat: Oropharynx is clear and moist.  Eyes: Conjunctivae are normal. Pupils are equal, round, and reactive to light.  Neck: Normal range of motion. No JVD present. No thyromegaly present.  Cardiovascular: Normal rate, regular rhythm, normal heart sounds and intact distal pulses.  Exam reveals no gallop and no friction rub.   No murmur heard. Pulmonary/Chest: Effort normal and breath sounds normal. No respiratory distress. He has no wheezes. He has no rales. He exhibits no tenderness.   Abdominal: Soft. Bowel sounds are normal. He exhibits no distension and no mass. There is no tenderness. There is no rebound and no guarding.  Musculoskeletal: Normal range of motion. He exhibits no edema and no tenderness.  Lymphadenopathy:    He has no cervical adenopathy.  Neurological: He is alert and oriented to person, place, and time. He has normal reflexes. No cranial nerve deficit. He exhibits normal muscle tone. Coordination normal.  Skin: Skin is warm and dry. No rash noted.  Psychiatric: He has a normal mood and affect. His behavior is normal. Judgment and thought content normal.     Lab Results  Component Value Date   WBC 6.8 03/15/2013   HGB 15.2 03/15/2013   HCT 45.7 03/15/2013   PLT 283 03/15/2013   GLUCOSE 146* 12/18/2012   CHOL 141 12/18/2012   TRIG 202.0* 12/18/2012   HDL 36.00* 12/18/2012   LDLDIRECT 54.1 12/18/2012   LDLCALC 63 12/22/2009   ALT 31 08/16/2012   AST 26 08/16/2012   NA 137 12/18/2012   K 4.6 12/18/2012   CL 102 12/18/2012   CREATININE 1.2 12/18/2012   BUN 19 12/18/2012   CO2 31 12/18/2012   TSH 0.57 12/18/2012   PSA 0.96 12/18/2012   HGBA1C 7.8* 12/18/2012       CT 1/13 CHEST  Findings: On the lung window images there is parenchymal opacity  within the posterior medial left lower lobe with air bronchograms.  This most likely represents pneumonia. If the patient has  undergone radiation, then this could represent radiation fibrosis.  The right lung is clear. No suspicious lung nodule is seen. No  pleural effusion is  noted.  On soft tissue window images, there is a low attenuation nodule in  the lower pole of the right lobe of thyroid posterior medially of  approximately 16 mm in diameter. Ultrasound of the thyroid could  be performed if necessary clinically. The thoracic aorta opacifies  with no complicating features and the origins of the great vessels  are patent. There are coronary artery calcifications noted. The  pulmonary arteries  opacify less well, but no significant  abnormality is seen. No mediastinal or hilar adenopathy is noted.  There is very minimal soft tissue prominence at the  gastroesophageal junction which is of questionable significance.  No definite mass is seen.  IMPRESSION:  1. Very minimal soft tissue prominence at the gastroesophageal  junction. No definite mass.  2. Parenchymal opacity in the left lower lobe. Probable  pneumonia. Correlate clinically.  3. Right lower lobe thyroid nodule of 16 mm in diameter.  4. Coronary artery calcifications.  CT ABDOMEN  Findings: Somewhat prominent soft tissue is again noted at the  gastroesophageal junction, but on sagittal and coronal images is  less impressive than on axial scans. No definite gastroesophageal  mass is seen. The liver enhances with minimal inhomogeneity and no  focal abnormality. No ductal dilatation is seen. No calcified  gallstones are seen. The pancreas is normal in size and the  pancreatic duct is not dilated. The adrenal glands and spleen are  unremarkable. The kidneys enhance and multiple right renal cysts  again are noted with a left lower pole renal cyst stable as well.  The abdominal aorta is normal in caliber. No adenopathy is seen.  The origins of the celiac axis, SMA, renal arteries and IMA are  patent. No bony abnormality is seen.  IMPRESSION:  1. No definite gastroesophageal mass is evident on sagittal and  coronal images.  2. Stable renal cysts.  Original Report Authenticated By: Joretta Bachelor, M.D.        Assessment & Plan:

## 2013-06-18 NOTE — Assessment & Plan Note (Signed)
Doing well 

## 2013-06-18 NOTE — Assessment & Plan Note (Signed)
Labs

## 2013-06-18 NOTE — Assessment & Plan Note (Signed)
Continue with current prescription therapy as reflected on the Med list.  

## 2013-06-18 NOTE — Assessment & Plan Note (Signed)
Try Stendra 100 mg prn

## 2013-08-14 ENCOUNTER — Other Ambulatory Visit: Payer: Self-pay | Admitting: Internal Medicine

## 2013-09-12 ENCOUNTER — Ambulatory Visit (HOSPITAL_BASED_OUTPATIENT_CLINIC_OR_DEPARTMENT_OTHER): Payer: Medicare Other | Admitting: Nurse Practitioner

## 2013-09-12 ENCOUNTER — Telehealth: Payer: Self-pay | Admitting: Nurse Practitioner

## 2013-09-12 ENCOUNTER — Other Ambulatory Visit (HOSPITAL_BASED_OUTPATIENT_CLINIC_OR_DEPARTMENT_OTHER): Payer: Medicare Other

## 2013-09-12 VITALS — BP 129/81 | HR 51 | Temp 97.0°F | Resp 19 | Ht 71.0 in | Wt 223.0 lb

## 2013-09-12 DIAGNOSIS — I4891 Unspecified atrial fibrillation: Secondary | ICD-10-CM

## 2013-09-12 DIAGNOSIS — C160 Malignant neoplasm of cardia: Secondary | ICD-10-CM

## 2013-09-12 DIAGNOSIS — D509 Iron deficiency anemia, unspecified: Secondary | ICD-10-CM

## 2013-09-12 DIAGNOSIS — K5730 Diverticulosis of large intestine without perforation or abscess without bleeding: Secondary | ICD-10-CM

## 2013-09-12 DIAGNOSIS — R1310 Dysphagia, unspecified: Secondary | ICD-10-CM

## 2013-09-12 DIAGNOSIS — D649 Anemia, unspecified: Secondary | ICD-10-CM

## 2013-09-12 DIAGNOSIS — E119 Type 2 diabetes mellitus without complications: Secondary | ICD-10-CM

## 2013-09-12 DIAGNOSIS — C169 Malignant neoplasm of stomach, unspecified: Secondary | ICD-10-CM

## 2013-09-12 LAB — CBC WITH DIFFERENTIAL/PLATELET
BASO%: 0.9 % (ref 0.0–2.0)
BASOS ABS: 0.1 10*3/uL (ref 0.0–0.1)
EOS ABS: 0.3 10*3/uL (ref 0.0–0.5)
EOS%: 4.5 % (ref 0.0–7.0)
HCT: 45.6 % (ref 38.4–49.9)
HEMOGLOBIN: 14.7 g/dL (ref 13.0–17.1)
LYMPH%: 30.3 % (ref 14.0–49.0)
MCH: 27.7 pg (ref 27.2–33.4)
MCHC: 32.2 g/dL (ref 32.0–36.0)
MCV: 86.1 fL (ref 79.3–98.0)
MONO#: 0.6 10*3/uL (ref 0.1–0.9)
MONO%: 9.5 % (ref 0.0–14.0)
NEUT%: 54.8 % (ref 39.0–75.0)
NEUTROS ABS: 3.6 10*3/uL (ref 1.5–6.5)
PLATELETS: 277 10*3/uL (ref 140–400)
RBC: 5.3 10*6/uL (ref 4.20–5.82)
RDW: 14.9 % — ABNORMAL HIGH (ref 11.0–14.6)
WBC: 6.6 10*3/uL (ref 4.0–10.3)
lymph#: 2 10*3/uL (ref 0.9–3.3)

## 2013-09-12 NOTE — Progress Notes (Signed)
[place] OFFICE PROGRESS NOTE   Diagnosis:  Esophagus cancer.  INTERVAL HISTORY:   Jon [name] returns as scheduled. He feels well. No interim illnesses or infections. He reports a good appetite. His weight is stable. He has a good energy level. He denies pain. No dysphagia or odynophagia. He denies fever, cough and shortness of breath. Bowels moving regularly.  Objective:  Vital signs in last 24 hours:  Blood pressure 129/81, pulse 51, temperature 97 F (36.1 C), temperature source Oral, resp. rate 19, height 5\' 11"  (1.803 m), weight 223 lb (101.152 kg).    HEENT: No thrush or ulcers. No neck mass. Lymphatics: No palpable cervical, supraclavicular or axillary lymph nodes. Resp: Diminished breath sounds and faint rales at the left lung base. No respiratory distress. Cardio: Regular rate and rhythm. GI: Abdomen is soft and nontender. No organomegaly. No mass. Vascular: No leg edema.   Lab Results:  Lab Results  Component Value Date   WBC 6.6 09/12/2013   HGB 14.7 09/12/2013   HCT 45.6 09/12/2013   MCV 86.1 09/12/2013   PLT 277 09/12/2013   NEUTROABS 3.6 09/12/2013   CEA pending. Imaging:  No results found.  Medications: I have reviewed the patient's current medications.  Assessment/Plan: 1. 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 Staging CT scan 08/27/2010 revealed small gastrohepatic ligament, periportal and retrocrural lymph nodes without other evidence of metastatic disease. Staging PET scan on 09/21/2010 showed abnormal malignant range FDG uptake at the site of the biopsy-proven gastric cancer. Small perigastric lymph nodes were not FDG avid above the background level. The radiologist commented that this may reflect a benign etiology or a false-negative appearance due to the small lymph node size. There was no evidence for distant metastatic disease. The CEA  was markedly elevated at 482 on 09/14/2010. He completed radiation 09/20/2010 through 10/29/2010. He received concurrent weekly Taxol/carboplatin chemotherapy 09/22/2010 through 10/27/2010. Restaging PET scan 11/22/2010 showed interval resolution of previously demonstrated hypermetabolic activity within the proximal stomach. There was no evidence of metastatic disease. Restaging PET scan 05/10/2011 showed no evidence of gastroesophageal carcinoma recurrence or metastasis. 2. Status post upper endoscopy 12/17/2010-the very distal portion of the esophagus was ulcerated. The proximal stomach revealed a malignant appearing ulcerative mass in the region of the cardia/gastroesophageal junction. The overall bulk of the lesion was significantly improved but still quite abnormal and large. Multiple biopsies were taken. Pathology was negative for malignancy. 3. Elevated CEA (482) on 09/14/2010. Repeat CEA on 09/10/2012-1.1 4. Dysphagia secondary to the lower esophagus/upper gastric tumor, resolved. 5. Microcytic anemia. The hemoglobin was normal on 03/31/2011. There was persistent red cell microcytosis. The hemoglobin is normal today . 6. Sleep apnea. 7. Early diabetes. 8. History of atrial fibrillation. 9. Hyperlipidemia. 10. History of colon polyps. 11. History of Helicobacter pylori infection in 2011. 12. History of neutropenia secondary to chemotherapy, resolved. 13. CT of the chest on 02/15/2011 with left lower lung density/air bronchograms-? Radiation change. 14. Colonoscopy 03/27/2013. Mild melanosis found throughout the entire examined colon. 2 diminutive polyps found in the descending colon (Tubular adenoma. No high-grade dysplasia or malignancy noted). Moderate diverticulosis in the left colon. Followup colonoscopy in 5 years.   Disposition: Jon [name] remains in clinical remission from esophagus cancer. We will followup on the CEA from today. He will return for a followup visit and labs in 6  months.    Ned Card ANP/GNP-BC   09/12/2013  11:35  AM

## 2013-09-12 NOTE — Telephone Encounter (Signed)
, °

## 2013-09-13 ENCOUNTER — Telehealth: Payer: Self-pay | Admitting: *Deleted

## 2013-09-13 LAB — CEA: CEA: 1.1 ng/mL (ref 0.0–5.0)

## 2013-09-13 NOTE — Telephone Encounter (Signed)
Notified of normal CEA. 

## 2013-09-13 NOTE — Telephone Encounter (Signed)
Message copied by Tania Ade on Fri Sep 13, 2013  1:09 PM ------      Message from: Ladell Pier      Created: Fri Sep 13, 2013  1:06 PM       Please call patient, cea is normal ------

## 2013-11-14 ENCOUNTER — Ambulatory Visit (INDEPENDENT_AMBULATORY_CARE_PROVIDER_SITE_OTHER): Payer: Medicare Other | Admitting: Pulmonary Disease

## 2013-11-14 ENCOUNTER — Encounter: Payer: Self-pay | Admitting: Pulmonary Disease

## 2013-11-14 VITALS — BP 122/78 | HR 67 | Temp 97.6°F | Ht 71.0 in | Wt 225.2 lb

## 2013-11-14 DIAGNOSIS — G4733 Obstructive sleep apnea (adult) (pediatric): Secondary | ICD-10-CM

## 2013-11-14 NOTE — Assessment & Plan Note (Signed)
The pt is doing great on cpap, and no issues with mask or pressure.  He is sleeping well, and feels rested during the day.  I have asked him to continue with this, and to keep up with mask changes and supplies.

## 2013-11-14 NOTE — Patient Instructions (Signed)
Continue on cpap, and keep up with mask changes and supplies. followup with me again in one year 

## 2013-11-14 NOTE — Progress Notes (Signed)
   Subjective:    Patient ID: RAYNOR CALCATERRA, male    DOB: 11/06/1945, 68 y.o.   MRN: [id number]  HPI The patient comes in today for followup of his obstructive sleep apnea. He is wearing CPAP compliantly, and is having no issues with his mask or pressure.  He is sleeping well with his device, and is satisfied with his daytime alertness.   Review of Systems  Constitutional: Negative for fever and unexpected weight change.  HENT: Negative for congestion, dental problem, ear pain, nosebleeds, postnasal drip, rhinorrhea, sinus pressure, sneezing, sore throat and trouble swallowing.   Eyes: Negative for redness and itching.  Respiratory: Negative for cough, chest tightness, shortness of breath and wheezing.   Cardiovascular: Negative for palpitations and leg swelling.  Gastrointestinal: Negative for nausea and vomiting.  Genitourinary: Negative for dysuria.  Musculoskeletal: Negative for joint swelling.  Skin: Negative for rash.  Neurological: Negative for headaches.  Hematological: Does not bruise/bleed easily.  Psychiatric/Behavioral: Negative for dysphoric mood. The patient is not nervous/anxious.        Objective:   Physical Exam Well-developed male in no acute distress Nose without purulence or discharge noted Neck without lymphadenopathy or thyromegaly No skin breakdown or pressure necrosis from the CPAP mask Lower extremities without edema, no cyanosis Alert and oriented, moves all 4 extremities.       Assessment & Plan:

## 2013-11-29 ENCOUNTER — Other Ambulatory Visit: Payer: Self-pay | Admitting: Internal Medicine

## 2013-12-18 ENCOUNTER — Ambulatory Visit (INDEPENDENT_AMBULATORY_CARE_PROVIDER_SITE_OTHER): Payer: Medicare Other | Admitting: Internal Medicine

## 2013-12-18 ENCOUNTER — Encounter: Payer: Self-pay | Admitting: Internal Medicine

## 2013-12-18 ENCOUNTER — Other Ambulatory Visit (INDEPENDENT_AMBULATORY_CARE_PROVIDER_SITE_OTHER): Payer: Medicare Other

## 2013-12-18 VITALS — BP 120/80 | HR 63 | Temp 98.5°F | Wt 222.0 lb

## 2013-12-18 DIAGNOSIS — I10 Essential (primary) hypertension: Secondary | ICD-10-CM

## 2013-12-18 DIAGNOSIS — E119 Type 2 diabetes mellitus without complications: Secondary | ICD-10-CM

## 2013-12-18 DIAGNOSIS — C160 Malignant neoplasm of cardia: Secondary | ICD-10-CM

## 2013-12-18 LAB — HEMOGLOBIN A1C: Hgb A1c MFr Bld: 7.4 % — ABNORMAL HIGH (ref 4.6–6.5)

## 2013-12-18 LAB — BASIC METABOLIC PANEL
BUN: 22 mg/dL (ref 6–23)
CHLORIDE: 104 meq/L (ref 96–112)
CO2: 27 meq/L (ref 19–32)
CREATININE: 1.3 mg/dL (ref 0.4–1.5)
Calcium: 9.7 mg/dL (ref 8.4–10.5)
GFR: 69.26 mL/min (ref >60.00)
Glucose, Bld: 119 mg/dL — ABNORMAL HIGH (ref 70–99)
Potassium: 3.9 mEq/L (ref 3.5–5.1)
Sodium: 140 mEq/L (ref 135–145)

## 2013-12-18 LAB — HEPATIC FUNCTION PANEL
ALT: 22 U/L (ref 0–53)
AST: 23 U/L (ref 0–37)
Albumin: 4.6 g/dL (ref 3.5–5.2)
Alkaline Phosphatase: 54 U/L (ref 39–117)
BILIRUBIN DIRECT: 0.1 mg/dL (ref 0.0–0.3)
Total Bilirubin: 0.8 mg/dL (ref 0.2–1.2)
Total Protein: 7.2 g/dL (ref 6.0–8.3)

## 2013-12-18 NOTE — Assessment & Plan Note (Signed)
Labs  Continue with current prescription therapy as reflected on the Med list.  

## 2013-12-18 NOTE — Progress Notes (Signed)
Subjective:    HPI    The patient presents for a follow-up of  chronic hypertension, chronic dyslipidemia controlled with medicines. 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 No wt loss per pt - good appetite F/u ED  Wt Readings from Last 3 Encounters:  12/18/13 222 lb (100.699 kg)  11/14/13 225 lb 3.2 oz (102.15 kg)  09/12/13 223 lb (101.152 kg)   BP Readings from Last 3 Encounters:  12/18/13 120/80  11/14/13 122/78  09/12/13 129/81     Review of Systems  Constitutional: Negative for appetite change, fatigue and unexpected weight change.  HENT: Negative for congestion, nosebleeds, sneezing, sore throat and trouble swallowing.   Eyes: Negative for itching and visual disturbance.  Respiratory: Negative for cough.   Cardiovascular: Negative for chest pain, palpitations and leg swelling.  Gastrointestinal: Negative for nausea, diarrhea, blood in stool and abdominal distention.  Genitourinary: Negative for frequency and hematuria.  Musculoskeletal: Negative for back pain, joint swelling, gait problem and neck pain.  Skin: Negative for rash.  Neurological: Negative for dizziness, tremors, speech difficulty and weakness.  Psychiatric/Behavioral: Negative for sleep disturbance, dysphoric mood and agitation. The patient is not nervous/anxious.         Objective:   Physical Exam  Constitutional: He is oriented to person, place, and time. He appears well-developed. No distress.  NAD  HENT:  Mouth/Throat: Oropharynx is clear and moist.  Eyes: Conjunctivae are normal. Pupils are equal, round, and reactive to light.  Neck: Normal range of motion. No JVD present. No thyromegaly present.  Cardiovascular: Normal rate, regular rhythm, normal heart sounds and intact distal pulses.  Exam reveals no gallop and no friction rub.   No murmur heard. Pulmonary/Chest: Effort normal and breath sounds normal. No respiratory distress. He has no wheezes. He has no rales. He  exhibits no tenderness.  Abdominal: Soft. Bowel sounds are normal. He exhibits no distension and no mass. There is no tenderness. There is no rebound and no guarding.  Musculoskeletal: Normal range of motion. He exhibits no edema or tenderness.  Lymphadenopathy:    He has no cervical adenopathy.  Neurological: He is alert and oriented to person, place, and time. He has normal reflexes. No cranial nerve deficit. He exhibits normal muscle tone. He displays a negative Romberg sign. Coordination and gait normal.  No meningeal signs  Skin: Skin is warm and dry. No rash noted.  Psychiatric: He has a normal mood and affect. His behavior is normal. Judgment and thought content normal.     Lab Results  Component Value Date   WBC 6.6 09/12/2013   HGB 14.7 09/12/2013   HCT 45.6 09/12/2013   PLT 277 09/12/2013   GLUCOSE 127* 06/18/2013   CHOL 141 12/18/2012   TRIG 202.0* 12/18/2012   HDL 36.00* 12/18/2012   LDLDIRECT 54.1 12/18/2012   LDLCALC 63 12/22/2009   ALT 26 06/18/2013   AST 29 06/18/2013   NA 139 06/18/2013   K 4.1 06/18/2013   CL 104 06/18/2013   CREATININE 1.4 06/18/2013   BUN 20 06/18/2013   CO2 27 06/18/2013   TSH 0.42 06/18/2013   PSA 0.96 12/18/2012   HGBA1C 7.7* 06/18/2013       CT 1/13 CHEST  Findings: On the lung window images there is parenchymal opacity  within the posterior medial left lower lobe with air bronchograms.  This most likely represents pneumonia. If the patient has  undergone radiation, then this could represent radiation fibrosis.  The  right lung is clear. No suspicious lung nodule is seen. No  pleural effusion is noted.  On soft tissue window images, there is a low attenuation nodule in  the lower pole of the right lobe of thyroid posterior medially of  approximately 16 mm in diameter. Ultrasound of the thyroid could  be performed if necessary clinically. The thoracic aorta opacifies  with no complicating features and the origins of the great  vessels  are patent. There are coronary artery calcifications noted. The  pulmonary arteries opacify less well, but no significant  abnormality is seen. No mediastinal or hilar adenopathy is noted.  There is very minimal soft tissue prominence at the  gastroesophageal junction which is of questionable significance.  No definite mass is seen.  IMPRESSION:  1. Very minimal soft tissue prominence at the gastroesophageal  junction. No definite mass.  2. Parenchymal opacity in the left lower lobe. Probable  pneumonia. Correlate clinically.  3. Right lower lobe thyroid nodule of 16 mm in diameter.  4. Coronary artery calcifications.  CT ABDOMEN  Findings: Somewhat prominent soft tissue is again noted at the  gastroesophageal junction, but on sagittal and coronal images is  less impressive than on axial scans. No definite gastroesophageal  mass is seen. The liver enhances with minimal inhomogeneity and no  focal abnormality. No ductal dilatation is seen. No calcified  gallstones are seen. The pancreas is normal in size and the  pancreatic duct is not dilated. The adrenal glands and spleen are  unremarkable. The kidneys enhance and multiple right renal cysts  again are noted with a left lower pole renal cyst stable as well.  The abdominal aorta is normal in caliber. No adenopathy is seen.  The origins of the celiac axis, SMA, renal arteries and IMA are  patent. No bony abnormality is seen.  IMPRESSION:  1. No definite gastroesophageal mass is evident on sagittal and  coronal images.  2. Stable renal cysts.  Original Report Authenticated By: Joretta Bachelor, M.D.        Assessment & Plan:

## 2013-12-18 NOTE — Assessment & Plan Note (Signed)
Clinically doing well F/u w/Dr Benay Spice

## 2013-12-18 NOTE — Progress Notes (Signed)
Pre visit review using our clinic review tool, if applicable. No additional management support is needed unless otherwise documented below in the visit note. 

## 2014-01-22 ENCOUNTER — Other Ambulatory Visit: Payer: Self-pay | Admitting: Internal Medicine

## 2014-01-29 ENCOUNTER — Other Ambulatory Visit: Payer: Self-pay | Admitting: Internal Medicine

## 2014-02-03 ENCOUNTER — Other Ambulatory Visit: Payer: Self-pay

## 2014-03-11 ENCOUNTER — Other Ambulatory Visit: Payer: Self-pay | Admitting: Internal Medicine

## 2014-03-11 MED ORDER — FENOFIBRATE 150 MG PO CAPS: 1.0000 | ORAL_CAPSULE | Freq: Every day | ORAL | Status: DC

## 2014-03-11 NOTE — Telephone Encounter (Signed)
Patient states that the Choline Fenofibrate (FENOFIBRIC ACID) 135 MG CPDR [[id number]] has been removed from his insurance formulary. He is asking that this be changed to an alternative so that he might be able to get insurance covg for the RX

## 2014-03-11 NOTE — Telephone Encounter (Signed)
Called pt no answer x's 10 rings.../lmb 

## 2014-03-11 NOTE — Telephone Encounter (Signed)
OK lipofen 150 mg/d Thx

## 2014-03-11 NOTE — Telephone Encounter (Signed)
Called pt spoke with wife inform md sent [facility].../lmb

## 2014-03-12 ENCOUNTER — Telehealth: Payer: Self-pay | Admitting: Internal Medicine

## 2014-03-12 MED ORDER — GEMFIBROZIL 600 MG PO TABS: 600.0000 mg | ORAL_TABLET | Freq: Two times a day (BID) | ORAL | Status: DC

## 2014-03-12 NOTE — Telephone Encounter (Signed)
Patient states that new RX is not on his formulary either. He has verified that these three medications are on the formulary. gemsibrozil or  omega-3-acid or  fenofibrate

## 2014-03-12 NOTE — Telephone Encounter (Signed)
Ok Gemfibrozil Done Thx

## 2014-03-12 NOTE — Telephone Encounter (Signed)
Called pt no answer LMOM with md response below. rx has been sent to pharmacy...Jon [name]

## 2014-03-25 ENCOUNTER — Telehealth: Payer: Self-pay | Admitting: Oncology

## 2014-03-25 ENCOUNTER — Ambulatory Visit (HOSPITAL_BASED_OUTPATIENT_CLINIC_OR_DEPARTMENT_OTHER): Payer: Medicare Other | Admitting: Oncology

## 2014-03-25 ENCOUNTER — Other Ambulatory Visit (HOSPITAL_BASED_OUTPATIENT_CLINIC_OR_DEPARTMENT_OTHER): Payer: Medicare Other

## 2014-03-25 VITALS — BP 133/80 | HR 52 | Temp 98.5°F | Resp 18 | Ht 71.0 in | Wt 220.7 lb

## 2014-03-25 DIAGNOSIS — C169 Malignant neoplasm of stomach, unspecified: Secondary | ICD-10-CM

## 2014-03-25 DIAGNOSIS — C160 Malignant neoplasm of cardia: Secondary | ICD-10-CM

## 2014-03-25 DIAGNOSIS — Z8509 Personal history of malignant neoplasm of other digestive organs: Secondary | ICD-10-CM

## 2014-03-25 LAB — CBC WITH DIFFERENTIAL/PLATELET
BASO%: 0.6 % (ref 0.0–2.0)
Basophils Absolute: 0 10*3/uL (ref 0.0–0.1)
EOS ABS: 0.2 10*3/uL (ref 0.0–0.5)
EOS%: 3.5 % (ref 0.0–7.0)
HCT: 48.6 % (ref 38.4–49.9)
HGB: 15.8 g/dL (ref 13.0–17.1)
LYMPH%: 33.4 % (ref 14.0–49.0)
MCH: 27.8 pg (ref 27.2–33.4)
MCHC: 32.4 g/dL (ref 32.0–36.0)
MCV: 85.9 fL (ref 79.3–98.0)
MONO#: 0.8 10*3/uL (ref 0.1–0.9)
MONO%: 13.1 % (ref 0.0–14.0)
NEUT#: 2.9 10*3/uL (ref 1.5–6.5)
NEUT%: 49.4 % (ref 39.0–75.0)
Platelets: 332 10*3/uL (ref 140–400)
RBC: 5.66 10*6/uL (ref 4.20–5.82)
RDW: 15 % — AB (ref 11.0–14.6)
WBC: 5.8 10*3/uL (ref 4.0–10.3)
lymph#: 2 10*3/uL (ref 0.9–3.3)

## 2014-03-25 NOTE — Progress Notes (Signed)
[place] OFFICE PROGRESS NOTE   Diagnosis: Gastroesophageal cancer  INTERVAL HISTORY:   Mr. Ropp returns as scheduled. He feels well. No dysphagia. No complaint. He has noted an enlarging mole at the right upper back.  Objective:  Vital signs in last 24 hours:  Blood pressure 133/80, pulse 52, temperature 98.5 F (36.9 C), temperature source Oral, resp. rate 18, height 5\' 11"  (1.803 m), weight 220 lb 11.2 oz (100.109 kg), SpO2 98 %.    HEENT: Neck without mass Lymphatics: No cervical, supra-clavicular, or axillary nodes Resp: End inspiratory rhonchi at the left posterior base, no respiratory distress Cardio: Regular rate and rhythm GI: No hepatosplenomegaly, nontender, no mass Vascular: No leg edema  Skin: 1 cm dark brown/black nodular mole at the right upper back with mild surrounding induration     Lab Results:  Lab Results  Component Value Date   WBC 5.8 03/25/2014   HGB 15.8 03/25/2014   HCT 48.6 03/25/2014   MCV 85.9 03/25/2014   PLT 332 03/25/2014   NEUTROABS 2.9 03/25/2014     Lab Results  Component Value Date   CEA 1.1 09/12/2013   Medications: I have reviewed the patient's current medications.  Assessment/Plan: 1. 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 Staging CT scan 08/27/2010 revealed small gastrohepatic ligament, periportal and retrocrural lymph nodes without other evidence of metastatic disease. Staging PET scan on 09/21/2010 showed abnormal malignant range FDG uptake at the site of the biopsy-proven gastric cancer. Small perigastric lymph nodes were not FDG avid above the background level. The radiologist commented that this may reflect a benign etiology or a false-negative appearance due to the small lymph node size. There was no evidence for distant metastatic disease. The CEA was markedly elevated at 482 on 09/14/2010. He completed  radiation 09/20/2010 through 10/29/2010. He received concurrent weekly Taxol/carboplatin chemotherapy 09/22/2010 through 10/27/2010. Restaging PET scan 11/22/2010 showed interval resolution of previously demonstrated hypermetabolic activity within the proximal stomach. There was no evidence of metastatic disease. Restaging PET scan 05/10/2011 showed no evidence of gastroesophageal carcinoma recurrence or metastasis. 2. Status post upper endoscopy 12/17/2010-the very distal portion of the esophagus was ulcerated. The proximal stomach revealed a malignant appearing ulcerative mass in the region of the cardia/gastroesophageal junction. The overall bulk of the lesion was significantly improved but still quite abnormal and large. Multiple biopsies were taken. Pathology was negative for malignancy. 3. Elevated CEA (482) on 09/14/2010.  4. Dysphagia secondary to the lower esophagus/upper gastric tumor, resolved. 5. Microcytic anemia. The hemoglobin was normal on 03/31/2011. There was persistent red cell microcytosis. The hemoglobin is normal today . 6. Sleep apnea. 7. Early diabetes. 8. History of atrial fibrillation. 9. Hyperlipidemia. 10. History of colon polyps. 11. History of Helicobacter pylori infection in 2011. 12. History of neutropenia secondary to chemotherapy, resolved. 13. CT of the chest on 02/15/2011 with left lower lung density/air bronchograms-? Radiation change. 14. Colonoscopy 03/27/2013. Mild melanosis found throughout the entire examined colon. 2 diminutive polyps found in the descending colon (Tubular adenoma. No high-grade dysplasia or malignancy noted). Moderate diverticulosis in the left colon. Followup colonoscopy in 5 years. 15. Nodular hyperpigmented mole at the right upper back-he is scheduled to see Dr. Alain Marion within the next month   Disposition:  Mr. Boateng remains in clinical remission from gastroesophageal carcinoma. We will follow-up on the CEA from today. He will  return for an office visit and CEA in 6 months.  He has a hyperpigmented nodular mole at the right upper back. I recommended he schedule an appointment  with Dr. Alain Marion for removal of this mole.   Betsy Coder, MD  03/25/2014  11:42 AM

## 2014-03-25 NOTE — Telephone Encounter (Signed)
Gave avs & calendar for August °

## 2014-03-26 LAB — CEA: CEA: 1.6 ng/mL (ref 0.0–5.0)

## 2014-03-27 ENCOUNTER — Telehealth: Payer: Self-pay | Admitting: *Deleted

## 2014-03-27 NOTE — Telephone Encounter (Addendum)
-----   Message from Ladell Pier, MD sent at 03/26/2014 12:02 PM EST ----- Please call patient, cea is normal  03/27/14 @ 2:05 pm:  [name] called back.  Message given that his CEA is normal.

## 2014-04-23 LAB — HM DIABETES EYE EXAM

## 2014-05-19 ENCOUNTER — Other Ambulatory Visit: Payer: Self-pay | Admitting: Internal Medicine

## 2014-06-09 ENCOUNTER — Encounter: Payer: Self-pay | Admitting: Internal Medicine

## 2014-06-18 ENCOUNTER — Encounter: Payer: Self-pay | Admitting: Internal Medicine

## 2014-06-18 ENCOUNTER — Ambulatory Visit (INDEPENDENT_AMBULATORY_CARE_PROVIDER_SITE_OTHER): Payer: Medicare Other | Admitting: Internal Medicine

## 2014-06-18 VITALS — BP 120/80 | HR 60 | Ht 71.0 in | Wt 221.0 lb

## 2014-06-18 DIAGNOSIS — N320 Bladder-neck obstruction: Secondary | ICD-10-CM

## 2014-06-18 DIAGNOSIS — Z0000 Encounter for general adult medical examination without abnormal findings: Secondary | ICD-10-CM | POA: Diagnosis not present

## 2014-06-18 DIAGNOSIS — E119 Type 2 diabetes mellitus without complications: Secondary | ICD-10-CM

## 2014-06-18 DIAGNOSIS — Z23 Encounter for immunization: Secondary | ICD-10-CM | POA: Diagnosis not present

## 2014-06-18 DIAGNOSIS — N528 Other male erectile dysfunction: Secondary | ICD-10-CM | POA: Diagnosis not present

## 2014-06-18 MED ORDER — SILDENAFIL CITRATE 20 MG PO TABS: 20.0000 mg | ORAL_TABLET | Freq: Every day | ORAL | Status: DC | PRN

## 2014-06-18 NOTE — Assessment & Plan Note (Signed)
A1c Metformin

## 2014-06-18 NOTE — Progress Notes (Signed)
   Subjective:    HPI  The patient is here for a wellness exam.   The patient presents for a follow-up of  chronic hypertension, chronic dyslipidemia controlled with medicines. 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 No wt loss per pt - good appetite. C/o ED  Wt Readings from Last 3 Encounters:  06/18/14 221 lb (100.245 kg)  03/25/14 220 lb 11.2 oz (100.109 kg)  12/18/13 222 lb (100.699 kg)   BP Readings from Last 3 Encounters:  06/18/14 120/80  03/25/14 133/80  12/18/13 120/80     Review of Systems  Constitutional: Negative for appetite change, fatigue and unexpected weight change.  HENT: Negative for congestion, nosebleeds, sneezing, sore throat and trouble swallowing.   Eyes: Negative for itching and visual disturbance.  Respiratory: Negative for cough.   Cardiovascular: Negative for chest pain, palpitations and leg swelling.  Gastrointestinal: Negative for nausea, diarrhea, blood in stool and abdominal distention.  Genitourinary: Negative for frequency and hematuria.  Musculoskeletal: Negative for back pain, joint swelling, gait problem and neck pain.  Skin: Negative for rash.  Neurological: Negative for dizziness, tremors, speech difficulty and weakness.  Psychiatric/Behavioral: Negative for sleep disturbance, dysphoric mood and agitation. The patient is not nervous/anxious.         Objective:   Physical Exam  Constitutional: He is oriented to person, place, and time. He appears well-developed.  HENT:  Mouth/Throat: Oropharynx is clear and moist.  Eyes: Conjunctivae are normal. Pupils are equal, round, and reactive to light.  Neck: Normal range of motion. No JVD present. No thyromegaly present.  Cardiovascular: Normal rate, regular rhythm, normal heart sounds and intact distal pulses.  Exam reveals no gallop and no friction rub.   No murmur heard. Pulmonary/Chest: Effort normal and breath sounds normal. No respiratory distress. He has no wheezes.  He has no rales. He exhibits no tenderness.  Abdominal: Soft. Bowel sounds are normal. He exhibits no distension and no mass. There is no tenderness. There is no rebound and no guarding.  Musculoskeletal: Normal range of motion. He exhibits no edema or tenderness.  Lymphadenopathy:    He has no cervical adenopathy.  Neurological: He is alert and oriented to person, place, and time. He has normal reflexes. No cranial nerve deficit. He exhibits normal muscle tone. Coordination normal.  Skin: Skin is warm and dry. No rash noted.  Psychiatric: He has a normal mood and affect. His behavior is normal. Judgment and thought content normal.     Lab Results  Component Value Date   WBC 5.8 03/25/2014   HGB 15.8 03/25/2014   HCT 48.6 03/25/2014   PLT 332 03/25/2014   GLUCOSE 119* 12/18/2013   CHOL 141 12/18/2012   TRIG 202.0* 12/18/2012   HDL 36.00* 12/18/2012   LDLDIRECT 54.1 12/18/2012   LDLCALC 63 12/22/2009   ALT 22 12/18/2013   AST 23 12/18/2013   NA 140 12/18/2013   K 3.9 12/18/2013   CL 104 12/18/2013   CREATININE 1.3 12/18/2013   BUN 22 12/18/2013   CO2 27 12/18/2013   TSH 0.42 06/18/2013   PSA 0.96 12/18/2012   HGBA1C 7.4* 12/18/2013            Assessment & Plan:

## 2014-06-18 NOTE — Assessment & Plan Note (Signed)
Sildanafil 20-100 mg prn

## 2014-06-18 NOTE — Progress Notes (Signed)
Pre visit review using our clinic review tool, if applicable. No additional management support is needed unless otherwise documented below in the visit note. 

## 2014-06-18 NOTE — Patient Instructions (Signed)

## 2014-06-18 NOTE — Assessment & Plan Note (Signed)
Here for medicare wellness/physical  Diet: heart healthy  Physical activity: not sedentary  Depression/mood screen: negative  Hearing: intact to whispered voice  Visual acuity: grossly normal, performs annual eye exam  ADLs: capable  Fall risk: none  Home safety: good  Cognitive evaluation: intact to orientation, naming, recall and repetition  EOL planning: adv directives, full code/ I agree  I have personally reviewed and have noted  1. The patient's medical, surgical and social history  2. Their use of alcohol, tobacco or illicit drugs  3. Their current medications and supplements  4. The patient's functional ability including ADL's, fall risks, home safety risks and hearing or visual impairment.  5. Diet and physical activities  6. Evidence for depression or mood disorders 7. The roster of all physicians providing medical care to patient - is listed in the Snapshot section of the chart and reviewed today.    Today patient counseled on age appropriate routine health concerns for screening and prevention, each reviewed and up to date or declined. Immunizations reviewed and up to date or declined. Labs ordered and reviewed. Risk factors for depression reviewed and negative. Hearing function and visual acuity are intact. ADLs screened and addressed as needed. Functional ability and level of safety reviewed and appropriate. Education, counseling and referrals performed based on assessed risks today. Patient provided with a copy of personalized plan for preventive services.     dT

## 2014-06-19 ENCOUNTER — Other Ambulatory Visit (INDEPENDENT_AMBULATORY_CARE_PROVIDER_SITE_OTHER): Payer: Medicare Other

## 2014-06-19 DIAGNOSIS — N528 Other male erectile dysfunction: Secondary | ICD-10-CM

## 2014-06-19 DIAGNOSIS — Z0000 Encounter for general adult medical examination without abnormal findings: Secondary | ICD-10-CM | POA: Diagnosis not present

## 2014-06-19 DIAGNOSIS — N320 Bladder-neck obstruction: Secondary | ICD-10-CM

## 2014-06-19 DIAGNOSIS — E119 Type 2 diabetes mellitus without complications: Secondary | ICD-10-CM | POA: Diagnosis not present

## 2014-06-19 LAB — CBC WITH DIFFERENTIAL/PLATELET
BASOS PCT: 0.3 % (ref 0.0–3.0)
Basophils Absolute: 0 10*3/uL (ref 0.0–0.1)
EOS ABS: 0.2 10*3/uL (ref 0.0–0.7)
Eosinophils Relative: 2.5 % (ref 0.0–5.0)
HCT: 46.4 % (ref 39.0–52.0)
HEMOGLOBIN: 15.8 g/dL (ref 13.0–17.0)
Lymphocytes Relative: 24.7 % (ref 12.0–46.0)
Lymphs Abs: 1.7 10*3/uL (ref 0.7–4.0)
MCHC: 34.1 g/dL (ref 30.0–36.0)
MCV: 84 fl (ref 78.0–100.0)
MONOS PCT: 11 % (ref 3.0–12.0)
Monocytes Absolute: 0.8 10*3/uL (ref 0.1–1.0)
NEUTROS ABS: 4.3 10*3/uL (ref 1.4–7.7)
Neutrophils Relative %: 61.5 % (ref 43.0–77.0)
Platelets: 327 10*3/uL (ref 150.0–400.0)
RBC: 5.53 Mil/uL (ref 4.22–5.81)
RDW: 15.3 % (ref 11.5–15.5)
WBC: 7 10*3/uL (ref 4.0–10.5)

## 2014-06-19 LAB — URINALYSIS
Bilirubin Urine: NEGATIVE
HGB URINE DIPSTICK: NEGATIVE
Ketones, ur: NEGATIVE
Leukocytes, UA: NEGATIVE
NITRITE: NEGATIVE
PH: 7.5 (ref 5.0–8.0)
Specific Gravity, Urine: 1.015 (ref 1.000–1.030)
TOTAL PROTEIN, URINE-UPE24: NEGATIVE
URINE GLUCOSE: NEGATIVE
UROBILINOGEN UA: 0.2 (ref 0.0–1.0)

## 2014-06-19 LAB — HEPATIC FUNCTION PANEL
ALT: 22 U/L (ref 0–53)
AST: 25 U/L (ref 0–37)
Albumin: 4.7 g/dL (ref 3.5–5.2)
Alkaline Phosphatase: 56 U/L (ref 39–117)
Bilirubin, Direct: 0.1 mg/dL (ref 0.0–0.3)
TOTAL PROTEIN: 7.2 g/dL (ref 6.0–8.3)
Total Bilirubin: 0.6 mg/dL (ref 0.2–1.2)

## 2014-06-19 LAB — PSA: PSA: 1.17 ng/mL (ref 0.10–4.00)

## 2014-06-19 LAB — BASIC METABOLIC PANEL
BUN: 16 mg/dL (ref 6–23)
CALCIUM: 10 mg/dL (ref 8.4–10.5)
CO2: 28 meq/L (ref 19–32)
CREATININE: 1.06 mg/dL (ref 0.40–1.50)
Chloride: 101 mEq/L (ref 96–112)
GFR: 89.08 mL/min (ref >60.00)
Glucose, Bld: 114 mg/dL — ABNORMAL HIGH (ref 70–99)
POTASSIUM: 4.4 meq/L (ref 3.5–5.1)
Sodium: 136 mEq/L (ref 135–145)

## 2014-06-19 LAB — LIPID PANEL
CHOL/HDL RATIO: 3
CHOLESTEROL: 127 mg/dL (ref 0–200)
HDL: 38.7 mg/dL — ABNORMAL LOW (ref >39.00)
LDL CALC: 66 mg/dL (ref 0–99)
NonHDL: 88.3
Triglycerides: 110 mg/dL (ref 0.0–149.0)
VLDL: 22 mg/dL (ref 0.0–40.0)

## 2014-06-19 LAB — TSH: TSH: 0.48 u[IU]/mL (ref 0.35–4.50)

## 2014-06-19 LAB — HEMOGLOBIN A1C: Hgb A1c MFr Bld: 7.3 % — ABNORMAL HIGH (ref 4.6–6.5)

## 2014-06-23 ENCOUNTER — Telehealth: Payer: Self-pay | Admitting: Oncology

## 2014-06-23 NOTE — Telephone Encounter (Signed)
Due to LT PAL - moved 8/19 lb/LT to 8/18. Left message for patient and mailed schedule.

## 2014-06-26 ENCOUNTER — Telehealth: Payer: Self-pay | Admitting: *Deleted

## 2014-06-26 NOTE — Telephone Encounter (Signed)
Sildenafil Citrate 20MG  PA is denied. Pharmacy informed. Please advise.

## 2014-06-27 NOTE — Telephone Encounter (Signed)
No rx is covered for dx ED.

## 2014-06-27 NOTE — Telephone Encounter (Signed)
What is covered? Thx

## 2014-09-18 ENCOUNTER — Telehealth: Payer: Self-pay | Admitting: Oncology

## 2014-09-18 ENCOUNTER — Ambulatory Visit (HOSPITAL_BASED_OUTPATIENT_CLINIC_OR_DEPARTMENT_OTHER): Payer: Medicare Other | Admitting: Nurse Practitioner

## 2014-09-18 ENCOUNTER — Other Ambulatory Visit (HOSPITAL_BASED_OUTPATIENT_CLINIC_OR_DEPARTMENT_OTHER): Payer: Medicare Other

## 2014-09-18 VITALS — BP 129/83 | HR 52 | Temp 98.2°F | Resp 18 | Ht 71.0 in | Wt 218.5 lb

## 2014-09-18 DIAGNOSIS — Z8509 Personal history of malignant neoplasm of other digestive organs: Secondary | ICD-10-CM

## 2014-09-18 DIAGNOSIS — C169 Malignant neoplasm of stomach, unspecified: Secondary | ICD-10-CM

## 2014-09-18 DIAGNOSIS — C160 Malignant neoplasm of cardia: Secondary | ICD-10-CM

## 2014-09-18 LAB — CEA: CEA: 0.9 ng/mL (ref 0.0–5.0)

## 2014-09-18 NOTE — Telephone Encounter (Signed)
Gave and printed appt sched and avs fo rpt for Feb 2017 °

## 2014-09-18 NOTE — Progress Notes (Signed)
[facility] OFFICE PROGRESS NOTE   Diagnosis:  Gastroesophageal cancer  INTERVAL HISTORY:   Jon [name] returns as scheduled. He feels well. He has a good appetite and good energy level. No dysphagia. He denies shortness of breath. No bowel or bladder problems.  Objective:  Vital signs in last 24 hours:  Blood pressure 129/83, pulse 52, temperature 98.2 F (36.8 C), temperature source Oral, resp. rate 18, height 5\' 11"  (1.803 m), weight 218 lb 8 oz (99.111 kg), SpO2 98 %.    HEENT: No thrush or ulcers. No neck mass. Lymphatics: No palpable cervical, supra clavicular, axillary or inguinal lymph nodes. Resp: Faint rales left lung base. No respiratory distress. Cardio: Regular rate and rhythm. GI: Abdomen soft and nontender. No organomegaly. No mass. Vascular: No leg edema. Skin: Approximate 7 mm dark brown mole right upper back.    Lab Results:  Lab Results  Component Value Date   WBC 7.0 06/19/2014   HGB 15.8 06/19/2014   HCT 46.4 06/19/2014   MCV 84.0 06/19/2014   PLT 327.0 06/19/2014   NEUTROABS 4.3 06/19/2014    Imaging:  No results found.  Medications: I have reviewed the patient's current medications.  Assessment/Plan: 1. 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 Staging CT scan 08/27/2010 revealed small gastrohepatic ligament, periportal and retrocrural lymph nodes without other evidence of metastatic disease. Staging PET scan on 09/21/2010 showed abnormal malignant range FDG uptake at the site of the biopsy-proven gastric cancer. Small perigastric lymph nodes were not FDG avid above the background level. The radiologist commented that this may reflect a benign etiology or a false-negative appearance due to the small lymph node size. There was no evidence for distant metastatic disease. The CEA was markedly elevated at 482 on 09/14/2010. He completed  radiation 09/20/2010 through 10/29/2010. He received concurrent weekly Taxol/carboplatin chemotherapy 09/22/2010 through 10/27/2010. Restaging PET scan 11/22/2010 showed interval resolution of previously demonstrated hypermetabolic activity within the proximal stomach. There was no evidence of metastatic disease. Restaging PET scan 05/10/2011 showed no evidence of gastroesophageal carcinoma recurrence or metastasis. 2. Status post upper endoscopy 12/17/2010-the very distal portion of the esophagus was ulcerated. The proximal stomach revealed a malignant appearing ulcerative mass in the region of the cardia/gastroesophageal junction. The overall bulk of the lesion was significantly improved but still quite abnormal and large. Multiple biopsies were taken. Pathology was negative for malignancy. 3. Elevated CEA (482) on 09/14/2010.  4. Dysphagia secondary to the lower esophagus/upper gastric tumor, resolved. 5. Microcytic anemia. The hemoglobin was normal on 03/31/2011. There was persistent red cell microcytosis. Hemoglobin and MCV within normal range on 06/19/2014. 6. Sleep apnea. 7. Early diabetes. 8. History of atrial fibrillation. 9. Hyperlipidemia. 10. History of colon polyps. 11. History of Helicobacter pylori infection in 2011. 12. History of neutropenia secondary to chemotherapy, resolved. 13. CT of the chest on 02/15/2011 with left lower lung density/air bronchograms-? Radiation change. 14. Colonoscopy 03/27/2013. Mild melanosis found throughout the entire examined colon. 2 diminutive polyps found in the descending colon (Tubular adenoma. No high-grade dysplasia or malignancy noted). Moderate diverticulosis in the left colon. Followup colonoscopy in 5 years. 15. Nodular hyperpigmented mole at the right upper back 03/25/2014. He reports the mole has been evaluated.   Disposition: Jon [name] remains in clinical remission from gastroesophageal carcinoma. We will follow-up on the CEA from  today. He will return for a follow-up visit and CEA in 6 months.  Ned Card ANP/GNP-BC   09/18/2014  9:09 AM

## 2014-09-19 ENCOUNTER — Other Ambulatory Visit: Payer: Medicare Other

## 2014-09-19 ENCOUNTER — Ambulatory Visit: Payer: Medicare Other | Admitting: Nurse Practitioner

## 2014-09-19 ENCOUNTER — Telehealth: Payer: Self-pay | Admitting: *Deleted

## 2014-09-19 NOTE — Telephone Encounter (Signed)
-----   Message from Jon Pier, MD sent at 09/18/2014  7:34 PM EDT ----- Please call patient, cea is normal

## 2014-09-19 NOTE — Telephone Encounter (Signed)
Pt made aware of CEA results; appreciated call; no other needs identified.

## 2014-10-03 ENCOUNTER — Other Ambulatory Visit: Payer: Self-pay | Admitting: *Deleted

## 2014-10-03 MED ORDER — DILTIAZEM HCL ER COATED BEADS 240 MG PO CP24: 240.0000 mg | ORAL_CAPSULE | Freq: Every day | ORAL | Status: DC

## 2014-10-07 ENCOUNTER — Other Ambulatory Visit: Payer: Self-pay | Admitting: *Deleted

## 2014-10-07 MED ORDER — OMEPRAZOLE 40 MG PO CPDR: 40.0000 mg | DELAYED_RELEASE_CAPSULE | Freq: Every day | ORAL | Status: DC

## 2014-11-07 ENCOUNTER — Other Ambulatory Visit: Payer: Self-pay | Admitting: Internal Medicine

## 2014-11-17 ENCOUNTER — Ambulatory Visit: Payer: Medicare Other | Admitting: Pulmonary Disease

## 2014-11-17 ENCOUNTER — Ambulatory Visit (INDEPENDENT_AMBULATORY_CARE_PROVIDER_SITE_OTHER): Payer: Medicare Other | Admitting: Pulmonary Disease

## 2014-11-17 ENCOUNTER — Encounter: Payer: Self-pay | Admitting: Pulmonary Disease

## 2014-11-17 ENCOUNTER — Ambulatory Visit (INDEPENDENT_AMBULATORY_CARE_PROVIDER_SITE_OTHER)
Admission: RE | Admit: 2014-11-17 | Discharge: 2014-11-17 | Disposition: A | Discharge status: Home / Self Care | Payer: Medicare Other | Source: Ambulatory Visit | Attending: Pulmonary Disease | Admitting: Pulmonary Disease

## 2014-11-17 VITALS — BP 122/78 | HR 60 | Ht 71.0 in | Wt 219.6 lb

## 2014-11-17 DIAGNOSIS — J700 Acute pulmonary manifestations due to radiation: Secondary | ICD-10-CM | POA: Diagnosis not present

## 2014-11-17 DIAGNOSIS — G4733 Obstructive sleep apnea (adult) (pediatric): Secondary | ICD-10-CM | POA: Diagnosis not present

## 2014-11-17 NOTE — Patient Instructions (Signed)
CPAP supplies will be renewed x 1 year CXR today

## 2014-11-17 NOTE — Progress Notes (Addendum)
   Subjective:    Patient ID: Jon [name], male    DOB: December 13, 1945, 69 y.o.   MRN: [id number]  HPI  Chief Complaint  Patient presents with  . Follow-up    Former [place] pt following for OSA.  pt states he is doing well. pt usijng CPAP every night for about 6 - 8 hours. pilllows and pressure good for pt. no concerns at this time. DME: [facility]   He has resmed Escape device with nasal pillows, doing well No sleepiness, denies snoring. Feels rested, no dryness  mask ok, pr ok, has been getting supplies on time Download in office shows good usage , ? pr  He underwent chemo-RT for esophageal adenoCA in 2012 ([facility] & [facility] Pastor) complicated by LLL radiation pneumonitis  NPSG 2007:  AHI 74/hr On cpap  Review of Systems neg for any significant sore throat, dysphagia, itching, sneezing, nasal congestion or excess/ purulent secretions, fever, chills, sweats, unintended wt loss, pleuritic or exertional cp, hempoptysis, orthopnea pnd or change in chronic leg swelling. Also denies presyncope, palpitations, heartburn, abdominal pain, nausea, vomiting, diarrhea or change in bowel or urinary habits, dysuria,hematuria, rash, arthralgias, visual complaints, headache, numbness weakness or ataxia.     Objective:   Physical Exam  Gen. Pleasant, well-nourished, in no distress ENT - no lesions, no post nasal drip Neck: No JVD, no thyromegaly, no carotid bruits Lungs: no use of accessory muscles, no dullness to percussion, left lower L rales, no rhonchi  Cardiovascular: Rhythm regular, heart sounds  normal, no murmurs or gallops, no peripheral edema Musculoskeletal: No deformities, no cyanosis or clubbing        Assessment & Plan:

## 2014-11-17 NOTE — Assessment & Plan Note (Signed)
FU CXR today

## 2014-11-17 NOTE — Assessment & Plan Note (Signed)
Weight loss encouraged, compliance with goal of at least 4-6 hrs every night is the expectation. Advised against medications with sedative side effects Cautioned against driving when sleepy - understanding that sleepiness will vary on a day to day basis  

## 2014-11-29 ENCOUNTER — Other Ambulatory Visit: Payer: Self-pay | Admitting: Internal Medicine

## 2014-12-12 ENCOUNTER — Other Ambulatory Visit: Payer: Self-pay | Admitting: Pulmonary Disease

## 2014-12-12 DIAGNOSIS — G4733 Obstructive sleep apnea (adult) (pediatric): Secondary | ICD-10-CM

## 2014-12-12 DIAGNOSIS — Z9989 Dependence on other enabling machines and devices: Principal | ICD-10-CM

## 2014-12-19 ENCOUNTER — Ambulatory Visit (INDEPENDENT_AMBULATORY_CARE_PROVIDER_SITE_OTHER): Payer: Medicare Other | Admitting: Internal Medicine

## 2014-12-19 ENCOUNTER — Encounter: Payer: Self-pay | Admitting: Internal Medicine

## 2014-12-19 ENCOUNTER — Other Ambulatory Visit (INDEPENDENT_AMBULATORY_CARE_PROVIDER_SITE_OTHER): Payer: Medicare Other

## 2014-12-19 VITALS — BP 120/68 | HR 51 | Wt 219.0 lb

## 2014-12-19 DIAGNOSIS — E119 Type 2 diabetes mellitus without complications: Secondary | ICD-10-CM

## 2014-12-19 DIAGNOSIS — E785 Hyperlipidemia, unspecified: Secondary | ICD-10-CM

## 2014-12-19 DIAGNOSIS — C169 Malignant neoplasm of stomach, unspecified: Secondary | ICD-10-CM

## 2014-12-19 DIAGNOSIS — I10 Essential (primary) hypertension: Secondary | ICD-10-CM

## 2014-12-19 LAB — HEPATIC FUNCTION PANEL
ALBUMIN: 4.6 g/dL (ref 3.5–5.2)
ALT: 22 U/L (ref 0–53)
AST: 23 U/L (ref 0–37)
Alkaline Phosphatase: 66 U/L (ref 39–117)
Bilirubin, Direct: 0.1 mg/dL (ref 0.0–0.3)
Total Bilirubin: 0.5 mg/dL (ref 0.2–1.2)
Total Protein: 7.3 g/dL (ref 6.0–8.3)

## 2014-12-19 LAB — BASIC METABOLIC PANEL
BUN: 19 mg/dL (ref 6–23)
CALCIUM: 9.9 mg/dL (ref 8.4–10.5)
CO2: 28 mEq/L (ref 19–32)
Chloride: 102 mEq/L (ref 96–112)
Creatinine, Ser: 1.11 mg/dL (ref 0.40–1.50)
GFR: 84.34 mL/min (ref >60.00)
GLUCOSE: 148 mg/dL — AB (ref 70–99)
Potassium: 4.3 mEq/L (ref 3.5–5.1)
SODIUM: 139 meq/L (ref 135–145)

## 2014-12-19 LAB — LIPID PANEL
CHOLESTEROL: 135 mg/dL (ref 0–200)
HDL: 36.7 mg/dL — ABNORMAL LOW (ref >39.00)
LDL CALC: 75 mg/dL (ref 0–99)
NonHDL: 98.53
TRIGLYCERIDES: 116 mg/dL (ref 0.0–149.0)
Total CHOL/HDL Ratio: 4
VLDL: 23.2 mg/dL (ref 0.0–40.0)

## 2014-12-19 LAB — HEMOGLOBIN A1C: Hgb A1c MFr Bld: 8.1 % — ABNORMAL HIGH (ref 4.6–6.5)

## 2014-12-19 NOTE — Assessment & Plan Note (Signed)
Chronic Cardizem 

## 2014-12-19 NOTE — Assessment & Plan Note (Signed)
CEA was nl 

## 2014-12-19 NOTE — Assessment & Plan Note (Signed)
Lopid 

## 2014-12-19 NOTE — Progress Notes (Signed)
Subjective:  Patient ID: Jon [name], male    DOB: 03/05/1945  Age: 69 y.o. MRN: [id number]  CC: No chief complaint on file.   HPI Jon [name] presents for a well exam  Outpatient Prescriptions Prior to Visit  Medication Sig Dispense Refill  . aspirin 325 MG EC tablet Take 325 mg by mouth daily.    . Cholecalciferol 1000 UNITS tablet Take 1,000 Units by mouth daily.     Marland Kitchen diltiazem (CARDIZEM CD) 240 MG 24 hr capsule Take 1 capsule (240 mg total) by mouth daily. 90 capsule 1  . gemfibrozil (LOPID) 600 MG tablet Take 1 tablet (600 mg total) by mouth 2 (two) times daily before a meal. 60 tablet 11  . metFORMIN (GLUCOPHAGE) 500 MG tablet TAKE 1 TABLET BY MOUTH TWICE DAILY WITH A MEAL. 180 tablet 2  . omeprazole (PRILOSEC) 40 MG capsule Take 1 capsule (40 mg total) by mouth daily. 90 capsule 2  . diltiazem (CARDIZEM CD) 240 MG 24 hr capsule TAKE ONE CAPSULE BY MOUTH EVERY DAY 30 capsule 5   No facility-administered medications prior to visit.    ROS Review of Systems  Constitutional: Negative for appetite change, fatigue and unexpected weight change.  HENT: Negative for congestion, nosebleeds, sneezing, sore throat and trouble swallowing.   Eyes: Negative for itching and visual disturbance.  Respiratory: Negative for cough.   Cardiovascular: Negative for chest pain, palpitations and leg swelling.  Gastrointestinal: Negative for nausea, diarrhea, blood in stool and abdominal distention.  Genitourinary: Negative for frequency and hematuria.  Musculoskeletal: Negative for back pain, joint swelling, gait problem and neck pain.  Skin: Negative for rash.  Neurological: Negative for dizziness, tremors, speech difficulty and weakness.  Psychiatric/Behavioral: Negative for suicidal ideas, sleep disturbance, dysphoric mood and agitation. The patient is not nervous/anxious.     Objective:  BP 120/68 mmHg  Pulse 51  Wt 219 lb (99.338 kg)  SpO2 95%  BP Readings from Last 3  Encounters:  12/19/14 120/68  11/17/14 122/78  09/18/14 129/83    Wt Readings from Last 3 Encounters:  12/19/14 219 lb (99.338 kg)  11/17/14 219 lb 9.6 oz (99.61 kg)  09/18/14 218 lb 8 oz (99.111 kg)    Physical Exam  Constitutional: He is oriented to person, place, and time. He appears well-developed. No distress.  NAD  HENT:  Mouth/Throat: Oropharynx is clear and moist.  Eyes: Conjunctivae are normal. Pupils are equal, round, and reactive to light.  Neck: Normal range of motion. No JVD present. No thyromegaly present.  Cardiovascular: Normal rate, regular rhythm, normal heart sounds and intact distal pulses.  Exam reveals no gallop and no friction rub.   No murmur heard. Pulmonary/Chest: Effort normal and breath sounds normal. No respiratory distress. He has no wheezes. He has no rales. He exhibits no tenderness.  Abdominal: Soft. Bowel sounds are normal. He exhibits no distension and no mass. There is no tenderness. There is no rebound and no guarding.  Musculoskeletal: Normal range of motion. He exhibits no edema or tenderness.  Lymphadenopathy:    He has no cervical adenopathy.  Neurological: He is alert and oriented to person, place, and time. He has normal reflexes. No cranial nerve deficit. He exhibits normal muscle tone. He displays a negative Romberg sign. Coordination and gait normal.  Skin: Skin is warm and dry. No rash noted.  Psychiatric: He has a normal mood and affect. His behavior is normal. Judgment and thought content normal.    Lab  Results  Component Value Date   WBC 7.0 06/19/2014   HGB 15.8 06/19/2014   HCT 46.4 06/19/2014   PLT 327.0 06/19/2014   GLUCOSE 114* 06/19/2014   CHOL 127 06/19/2014   TRIG 110.0 06/19/2014   HDL 38.70* 06/19/2014   LDLDIRECT 54.1 12/18/2012   LDLCALC 66 06/19/2014   ALT 22 06/19/2014   AST 25 06/19/2014   NA 136 06/19/2014   K 4.4 06/19/2014   CL 101 06/19/2014   CREATININE 1.06 06/19/2014   BUN 16 06/19/2014   CO2 28  06/19/2014   TSH 0.48 06/19/2014   PSA 1.17 06/19/2014   HGBA1C 7.3* 06/19/2014    Dg Chest 2 View  11/17/2014  CLINICAL DATA:  Radiation pneumonitis. Crackles at the left base. Personal history of gastroesophageal carcinoma and radiation. EXAM: CHEST - 2 VIEW COMPARISON:  CT of the chest 02/15/2011. FINDINGS: Minimal left basilar scarring is stable, compatible with radiation change. Heart size is normal. The lungs are otherwise clear. The visualized soft tissues and bony thorax are unremarkable. IMPRESSION: 1. Stable scarring at the left lung base is likely related to prior radiation therapy. 2. No acute cardiopulmonary disease. Electronically Signed   By: San Morelle M.D.   On: 11/17/2014 10:18    Assessment & Plan:   Diagnoses and all orders for this visit:  Type 2 diabetes mellitus without complication, without long-term current use of insulin (HCC) -     Basic metabolic panel; Future -     Hemoglobin A1c; Future -     Hepatic function panel; Future -     Lipid panel; Future  Essential hypertension -     Basic metabolic panel; Future -     Hemoglobin A1c; Future -     Hepatic function panel; Future -     Lipid panel; Future  Primary adenocarcinoma of GE junction -     Basic metabolic panel; Future -     Hemoglobin A1c; Future -     Hepatic function panel; Future -     Lipid panel; Future  Dyslipidemia -     Basic metabolic panel; Future -     Hemoglobin A1c; Future -     Hepatic function panel; Future -     Lipid panel; Future  I am having Jon [name] maintain his Cholecalciferol, aspirin, gemfibrozil, diltiazem, omeprazole, and metFORMIN.  No orders of the defined types were placed in this encounter.     Follow-up: Return in about 6 months (around 06/18/2015) for Wellness Exam.  Walker Kehr, MD

## 2014-12-19 NOTE — Assessment & Plan Note (Signed)
Metformin Labs 

## 2014-12-19 NOTE — Progress Notes (Signed)
Pre visit review using our clinic review tool, if applicable. No additional management support is needed unless otherwise documented below in the visit note. 

## 2014-12-21 ENCOUNTER — Other Ambulatory Visit: Payer: Self-pay | Admitting: Internal Medicine

## 2014-12-21 MED ORDER — METFORMIN HCL 500 MG PO TABS: 500.0000 mg | ORAL_TABLET | Freq: Three times a day (TID) | ORAL | Status: DC

## 2015-02-09 ENCOUNTER — Other Ambulatory Visit: Payer: Self-pay | Admitting: Internal Medicine

## 2015-03-16 ENCOUNTER — Ambulatory Visit (HOSPITAL_BASED_OUTPATIENT_CLINIC_OR_DEPARTMENT_OTHER): Payer: Medicare Other | Admitting: Oncology

## 2015-03-16 ENCOUNTER — Other Ambulatory Visit (HOSPITAL_BASED_OUTPATIENT_CLINIC_OR_DEPARTMENT_OTHER): Payer: Medicare Other

## 2015-03-16 ENCOUNTER — Telehealth: Payer: Self-pay | Admitting: Oncology

## 2015-03-16 VITALS — BP 125/78 | HR 56 | Temp 97.8°F | Resp 18 | Ht 71.0 in | Wt 217.7 lb

## 2015-03-16 DIAGNOSIS — C160 Malignant neoplasm of cardia: Secondary | ICD-10-CM

## 2015-03-16 DIAGNOSIS — Z8509 Personal history of malignant neoplasm of other digestive organs: Secondary | ICD-10-CM | POA: Diagnosis not present

## 2015-03-16 NOTE — Progress Notes (Signed)
  [facility] OFFICE PROGRESS NOTE   Diagnosis: Gastroesophageal carcinoma  INTERVAL HISTORY:   [name] returns as scheduled. He feels well. No dysphagia. No complaint. Good appetite.  Objective:  Vital signs in last 24 hours:  Blood pressure 125/78, pulse 56, temperature 97.8 F (36.6 C), temperature source Oral, resp. rate 18, height 5\' 11"  (1.803 m), weight 217 lb 11.2 oz (98.748 kg), SpO2 97 %.    HEENT: Neck without mass Lymphatics: No cervical, supraclavicular, or axillary nodes Resp: In inspiratory rhonchi at the left posterior base, no respiratory distress Cardio: Regular rate and rhythm GI: No hepatomegaly, no mass, nontender Vascular: No leg edema  Skin: 1 cm dark brown raised mole at the right upper back    Lab Results:  CEA pending    Medications: I have reviewed the patient's current medications.  Assessment/Plan: 1. 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 Staging CT scan 08/27/2010 revealed small gastrohepatic ligament, periportal and retrocrural lymph nodes without other evidence of metastatic disease. Staging PET scan on 09/21/2010 showed abnormal malignant range FDG uptake at the site of the biopsy-proven gastric cancer. Small perigastric lymph nodes were not FDG avid above the background level. The radiologist commented that this may reflect a benign etiology or a false-negative appearance due to the small lymph node size. There was no evidence for distant metastatic disease. The CEA was markedly elevated at 482 on 09/14/2010. He completed radiation 09/20/2010 through 10/29/2010. He received concurrent weekly Taxol/carboplatin chemotherapy 09/22/2010 through 10/27/2010. Restaging PET scan 11/22/2010 showed interval resolution of previously demonstrated hypermetabolic activity within the proximal stomach. There was no evidence of metastatic disease.  Restaging PET scan 05/10/2011 showed no evidence of gastroesophageal carcinoma recurrence or metastasis. 2. Status post upper endoscopy 12/17/2010-the very distal portion of the esophagus was ulcerated. The proximal stomach revealed a malignant appearing ulcerative mass in the region of the cardia/gastroesophageal junction. The overall bulk of the lesion was significantly improved but still quite abnormal and large. Multiple biopsies were taken. Pathology was negative for malignancy. 3. Elevated CEA (482) on 09/14/2010.  4. Dysphagia secondary to the lower esophagus/upper gastric tumor, resolved. 5. Microcytic anemia. The hemoglobin was normal on 03/31/2011. There was persistent red cell microcytosis. Hemoglobin and MCV within normal range on 06/19/2014. 6. Sleep apnea. 7. Early diabetes. 8. History of atrial fibrillation. 9. Hyperlipidemia. 10. History of colon polyps. 11. History of Helicobacter pylori infection in 2011. 12. History of neutropenia secondary to chemotherapy, resolved. 13. CT of the chest on 02/15/2011 with left lower lung density/air bronchograms-? Radiation change. 14. Colonoscopy 03/27/2013. Mild melanosis found throughout the entire examined colon. 2 diminutive polyps found in the descending colon (Tubular adenoma. No high-grade dysplasia or malignancy noted). Moderate diverticulosis in the left colon. Followup colonoscopy in 5 years. 15. Nodular hyperpigmented mole at the right upper back 03/25/2014. He reports the mole has been evaluated.  Disposition:  Mr. Gruver remains in clinical remission from gastroesophageal carcinoma. We will follow-up on the CEA from today. He will return for an office visit and CEA in 6 months. I recommended he asked Dr.Plotnikov to evaluate the right upper back mole.   Betsy Coder, MD  03/16/2015  9:13 AM

## 2015-03-16 NOTE — Telephone Encounter (Signed)
per pof to sch pt appt-gave pt copy of avs °

## 2015-03-17 LAB — CEA (PARALLEL TESTING): CEA: 1.1 ng/mL

## 2015-03-17 LAB — CEA: CEA: 3.9 ng/mL (ref 0.0–4.7)

## 2015-03-18 ENCOUNTER — Telehealth: Payer: Self-pay | Admitting: *Deleted

## 2015-03-18 NOTE — Telephone Encounter (Signed)
Per Dr. Benay Spice; notified pt that cea is normal, f/u as scheduled.  Pt verbalized understanding.

## 2015-03-18 NOTE — Telephone Encounter (Signed)
-----   Message from Jon Pier, MD sent at 03/17/2015  1:11 PM EST ----- Please call patient, cea is normal, f/u as scheduled

## 2015-03-19 DIAGNOSIS — G4733 Obstructive sleep apnea (adult) (pediatric): Secondary | ICD-10-CM | POA: Diagnosis not present

## 2015-04-10 ENCOUNTER — Telehealth: Payer: Self-pay | Admitting: *Deleted

## 2015-04-10 NOTE — Telephone Encounter (Signed)
Received call pt states he is wanting to see if its ok for him to go back on Fenofibrate (capsule) instead of taking the Gemfibrozil because its better for him to swallow. Inform pt md is out of office today will return back Monday. May be Monday before msg will be address...[name]

## 2015-04-12 NOTE — Telephone Encounter (Signed)
Ok if it is covered Risk manager

## 2015-04-13 MED ORDER — FENOFIBRATE MICRONIZED 130 MG PO CAPS: ORAL_CAPSULE | ORAL | Status: DC

## 2015-04-13 NOTE — Telephone Encounter (Signed)
Called pt was told by lady was not home. Left msg w/her to have him to return call back...Jon [name]

## 2015-04-13 NOTE — Telephone Encounter (Signed)
Pt return call back gave him md response. Inform rx was sent to [facility]...Jon [name]

## 2015-04-14 ENCOUNTER — Telehealth: Payer: Self-pay

## 2015-04-14 NOTE — Telephone Encounter (Signed)
Pt called the insurance and they told him it was denied because when he was on it previously he was taken the 134 mg and this prescription is for 130 mg. Can you please write another one for 134 mg

## 2015-04-14 NOTE — Telephone Encounter (Signed)
PA denied. Pt advised via personal VM

## 2015-04-14 NOTE — Telephone Encounter (Signed)
PA initiated via CoverMyMeds Key [id number]

## 2015-04-16 MED ORDER — FENOFIBRATE MICRONIZED 134 MG PO CAPS: 134.0000 mg | ORAL_CAPSULE | Freq: Every day | ORAL | Status: DC

## 2015-04-16 MED ORDER — FENOFIBRIC ACID 135 MG PO CPDR: 1.0000 | DELAYED_RELEASE_CAPSULE | Freq: Every day | ORAL | Status: DC

## 2015-04-16 NOTE — Telephone Encounter (Addendum)
Ok 134 mg - emailed. It was 135 mg on the chart Done Thx

## 2015-04-16 NOTE — Addendum Note (Signed)
Addended by: [name] on: 04/16/2015 05:56 PM   Modules accepted: Orders, Medications

## 2015-04-24 DIAGNOSIS — H40023 Open angle with borderline findings, high risk, bilateral: Secondary | ICD-10-CM | POA: Diagnosis not present

## 2015-04-24 DIAGNOSIS — H11153 Pinguecula, bilateral: Secondary | ICD-10-CM | POA: Diagnosis not present

## 2015-04-24 DIAGNOSIS — E119 Type 2 diabetes mellitus without complications: Secondary | ICD-10-CM | POA: Diagnosis not present

## 2015-04-24 LAB — HM DIABETES EYE EXAM

## 2015-04-30 ENCOUNTER — Encounter: Payer: Self-pay | Admitting: Internal Medicine

## 2015-06-18 ENCOUNTER — Other Ambulatory Visit: Payer: Self-pay

## 2015-06-18 ENCOUNTER — Ambulatory Visit (INDEPENDENT_AMBULATORY_CARE_PROVIDER_SITE_OTHER): Payer: Medicare Other

## 2015-06-18 VITALS — BP 106/76 | HR 51 | Ht 71.0 in | Wt 217.5 lb

## 2015-06-18 DIAGNOSIS — Z0000 Encounter for general adult medical examination without abnormal findings: Secondary | ICD-10-CM

## 2015-06-18 DIAGNOSIS — Z7289 Other problems related to lifestyle: Secondary | ICD-10-CM | POA: Diagnosis not present

## 2015-06-18 DIAGNOSIS — E119 Type 2 diabetes mellitus without complications: Secondary | ICD-10-CM

## 2015-06-18 MED ORDER — DILTIAZEM HCL ER COATED BEADS 240 MG PO CP24: 240.0000 mg | ORAL_CAPSULE | Freq: Every day | ORAL | Status: DC

## 2015-06-18 NOTE — Progress Notes (Addendum)
Subjective:   Jon [name] is a 70 y.o. male who presents for Medicare Annual/Subsequent preventive examination.  Review of Systems:   HRA assessment completed during visit; [place]  The Patient was informed that this wellness visit is to identify risk and educate on how to reduce risk for increase disease through lifestyle changes.   ROS deferred to CPE exam with physician completed recently Family and medical hx given below;    Feels health is great; still mobile  Lifestyle review:   DM2 8.1 and started on Metformin ac tid; was taking one per day Dyslipidemia (cho 135; trig 116; HDL 36 and LDL 75)  Educated regarding HDL  A1c checks; in the am 100-120; 2 hours pc meal down to 140 depends on what he is doing Compliant with cpap; states he sleeps well and uses every night   Psychosocial Single; has a friend; currently ill   Tobacco: no  ETOH' one drink a week; shot  Medication review/ Adherent;   BMI: 30.2  Diet; not much fruit Breakfast; fiber one cereal; banana Lunch; Meat; salad;  Eat's very seldom Supper; Working on and trying to eat less Loves potatoes and rice; salads in the evening;  Now has salads and lean meats;   Exercise;   Used to ride bikes; 10 miles a day; a lot of hills;  Stopped due to friend having medical issues; Plans to start back as one of his goals   HOME SAFETY; one level home No falls; no  Removal of clutter clearing paths through the home,   Railing as needed discussed / no tub in the home  Community safety; YES Smoke detectors YES Firearms safety reviewed and will keep in a safe place if these exist. Driving accidents; no;   Advised to use sun protection / wears hat   Stressors (1-5) - 0 stress  Depression: Denies feeling depressed or hopeless; voices pleasure in daily life; no   Cognitive;  Manages checkbook, medications; no failures of task Ad8 score reviewed for issues;  Issues making decisions; no  Less  interest in hobbies / activities" no  Repeats questions, stories; family complaining: NO  Trouble using ordinary gadgets; microwave; computer: no  Forgets the month or year: no  Mismanaging finances: no  Missing apt: no but does write them down  Daily problems with thinking of memory NO Ad8 score is 0   Mobilization and Functional losses from last year to this year? Ok   Sleep pattern changes; sleeps well on cpap; gets 70 yo 7 hours of sleep   Urinary or fecal incontinence reviewed: no   Counseling Health Maintenance Hep c; educated  Colonoscopy; 03/2013/ repeat in 5 years   EKG: 05/2012 Hearing: 2000hz  in both ears  Ophthalmology exam: this year; Dr. Rosana Hoes with Dr Herbert Deaner;  No problems; wears glasses  Immunizations Due: up to date x flu; declines flu shot annually  Advanced Directive; yes; will bring copy for chart  Health Recommendations and Referrals Will start exercising in June   Barriers to Success  Current Care Team reviewed and updated  Cardiac Risk Factors include: advanced age (>54men, >21 women);diabetes mellitus;dyslipidemia;male gender;obesity (BMI >30kg/m2) (not sedentary; stay busy but will try to engage in regular exericise starting in June)     Objective:    Vitals: BP 106/76 mmHg  Pulse 51  Ht 5\' 11"  (1.803 m)  Wt 217 lb 8 oz (98.657 kg)  BMI 30.35 kg/m2  SpO2 97%  Body mass index is 30.35 kg/(m^2).  Tobacco History  Smoking status  . Former Smoker -- 1.00 packs/day for 40 years  . Types: Cigarettes  . Quit date: 01/31/2005  Smokeless tobacco  . Never Used     Counseling given: Yes   Past Medical History  Diagnosis Date  . Atrial fibrillation ([place])   . Other and unspecified hyperlipidemia   . Unspecified sleep apnea   . Glaucoma   . Cataract   . GERD (gastroesophageal reflux disease)   . H. pylori infection 2011  . Diverticulosis   . Adenomatous colon polyp   . Anemia   . Heart murmur   . 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[place]) 2012     Gastroesophageal /radiation Tx and Chemo  . History of radiation therapy 09/20/10 thru 10/29/10    gastroesophageal junction/gastric cardia  . Status post chemotherapy 09/22/10 thru 10/27/10    concurrent w/radiation  . Diabetes mellitus without complication [facility])    Past Surgical History  Procedure Laterality Date  . Tonsillectomy    . Elbow bursa surgery    . Colonoscopy    . Leg surgery  1980    right leg   . Cataract extraction      bilateral  . Skin biopsy  08/22/11    shave biopsy =benign   Family History  Problem Relation Age of Onset  . Coronary artery disease Neg Hx   . Arthritis Mother   . Diabetes Brother   . Diabetes Father   . Colon cancer Neg Hx    History  Sexual Activity  . Sexual Activity: Yes    Outpatient Encounter Prescriptions as of 06/18/2015  Medication Sig  . aspirin 325 MG EC tablet Take 325 mg by mouth daily.  . Cholecalciferol 1000 UNITS tablet Take 1,000 Units by mouth daily.   Marland Kitchen diltiazem (CARDIZEM CD) 240 MG 24 hr capsule Take 1 capsule (240 mg total) by mouth daily.  . fenofibrate micronized (LOFIBRA) 134 MG capsule Take 1 capsule (134 mg total) by mouth daily before breakfast.  . metFORMIN (GLUCOPHAGE) 500 MG tablet Take 1 tablet (500 mg total) by mouth 3 (three) times daily.  Marland Kitchen omeprazole (PRILOSEC) 40 MG capsule Take 1 capsule (40 mg total) by mouth daily.  . Choline Fenofibrate (FENOFIBRIC ACID) 135 MG CPDR Take 1 capsule by mouth daily. (Patient not taking: Reported on 06/18/2015)   No facility-administered encounter medications on file as of 06/18/2015.    Activities of Daily Living In your present state of health, do you have any difficulty performing the following activities: 06/18/2015  Hearing? N  Vision? N  Difficulty concentrating or making decisions? N  Walking or climbing stairs? N  Dressing or bathing? N  Doing errands, shopping? N  Preparing Food and eating ? N  Using the Toilet? N  In the past six months, have you  accidently leaked urine? N  Do you have problems with loss of bowel control? N  Managing your Medications? N  Managing your Finances? N  Housekeeping or managing your Housekeeping? N    Patient Care Team: [name], MD as PCP - General Irene Shipper, MD (Gastroenterology) Fay Records, MD (Cardiology) Kathee Delton, MD (Pulmonary Disease) Ladell Pier, MD as Consulting Physician (Internal Medicine) Kyung Rudd, MD as Consulting Physician (Radiation Oncology) Stark Klein, MD as Consulting Physician (General Surgery) Irene Shipper, MD as Consulting Physician (Gastroenterology) Nicanor Alcon, MD (Thoracic Surgery)   Assessment:     Exercise Activities and Dietary recommendations Current Exercise Habits: Home  exercise routine, Intensity: Mild, Exercise limited by: Other - see comments  Goals    . Exercise 150 minutes per week (moderate activity)     Will start riding bike again in the evening; Weight; goal is 205 - 210;  Start date 1st of June; will try to ride 3 time a week  Wt loss 7 to 10 lbs       . 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 reviewed for goal setting: Includes; family hx; BP control <140/80; monitoring renal disease; obesity (BMI >30); sedentary lifestyle Educated regarding Metabolic syndrome / Excess weight around the waist; can lose with walking vigoursly x 30 min or biking x 30 / reach for 5 days a week  Waist circumference for Men >40   Triglycerides > 150 HDL < 50 ( 36 is your score)  BP > 130/85 Glucose > 100 Plan Lifestyle changes  Heart Healthy Diet; less sugar  Mediterranean Diet: eating primarily plant-based food such as fruits and vegetables, whole grains, legumes and nuts; replacing butter with healthy fats such as olive oil and canola oil Using herbs and spices instead of salt to flavor food Limiting red meat to no more than a few times a month Eating fish and poultry  at least 2 times a week Getting plenty of exercise  BP Education: <120/80; any elevation will warrant further checks  BP can be elevated by lack of quality sleep OTC cold medications  Hormone Therapy post menopause  Eating to much sodium Processed foods (daily intake 2500mg  or one teaspoon of salt) Drinking to much alcohol  Smoking Lack of physical activity             Fall Risk Fall Risk  06/18/2015 06/18/2014 09/12/2013  Falls in the past year? No No No   Depression Screen PHQ 2/9 Scores 06/18/2015 06/18/2014  PHQ - 2 Score 0 0    Cognitive Testing No flowsheet data found.  Ad8 score is 0   Immunization History  Administered Date(s) Administered  . Pneumococcal Conjugate-13 12/18/2012  . Pneumococcal Polysaccharide-23 03/15/2011  . Td 03/18/2004, 06/18/2014  . Zoster 12/31/2009   Screening Tests Health Maintenance  Topic Date Due  . Hepatitis C Screening  04-Dec-1945  . URINE MICROALBUMIN  06/08/1955  . FOOT EXAM  12/18/2013  . HEMOGLOBIN A1C  06/18/2015  . INFLUENZA VACCINE  12/19/2015 (Originally 09/01/2015)  . OPHTHALMOLOGY EXAM  04/23/2016  . COLONOSCOPY  03/27/2018  . TETANUS/TDAP  06/17/2024  . ZOSTAVAX  Completed  . PNA vac Low Risk Adult  Completed      Plan:     Put in micro albumin/ hep c for next blood draw Agrees to [facility]; referral made  Goal is to get A1c back down to normal and come off meds  Discussed exercise and DM; agreed to start riding his bike;  Educated regarding the completion of his AD   During the course of the visit the patient was educated and counseled about the following appropriate screening and preventive services:   Vaccines to include Pneumoccal, Influenza, Hepatitis B, Td, Zostavax, HCV/ does not take the flu shots; all others up to date  Electrocardiogram /05/2012  Cardiovascular Disease/ DM; wants to lose 10 lbs;   Colorectal cancer screening/ 5 years 03/2018  Diabetes screening/  inc' d medicine; enc exercise as he would like to come off medicine  Prostate Cancer Screening deferred  Glaucoma screening eye exam this year and was neg ;  Nutrition counseling / Did agree to go to the [facility]; will put in a referral   Smoking cessation counseling/ na/  Patient Instructions (the written plan) was given to the patient.    Wynetta Fines, RN  06/18/2015   Medical screening examination/treatment/procedure(s) were performed by non-physician practitioner and as supervising physician I was immediately available for consultation/collaboration. I agree with above. Walker Kehr, MD

## 2015-06-18 NOTE — Patient Instructions (Addendum)
Mr. Greenhagen , Thank you for taking time to come for your Medicare Wellness Visit. I appreciate your ongoing commitment to your health goals. Please review the following plan we discussed and let me know if I can assist you in the future.   Has AD and will try to complete [place] offers free advance directive forms, as well as assistance in completing the forms themselves.  For assistance, contact the Spiritual Care Department at [phone], or the Clinical Social Work Department at [phone].  [facility] to [facility] Address: [address], [place], [place] [address]  Phone: [phone]    These are the goals we discussed: Goals    . Exercise 150 minutes per week (moderate activity)     Will start riding bike again in the evening; Weight; goal is 205 - 210;  Start date 1st of June; will try to ride 3 time a week  Wt loss 7 to 10 lbs       . 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 reviewed for goal setting: Includes; family hx; BP control <140/80; monitoring renal disease; obesity (BMI >30); sedentary lifestyle Educated regarding Metabolic syndrome / Excess weight around the waist; can lose with walking vigoursly x 30 min or biking x 30 / reach for 5 days a week  Waist circumference for Men >40   Triglycerides > 150 HDL < 50 ( 36 is your score)  BP > 130/85 Glucose > 100 Plan Lifestyle changes  Heart Healthy Diet; less sugar  Mediterranean Diet: eating primarily plant-based food such as fruits and vegetables, whole grains, legumes and nuts; replacing butter with healthy fats such as olive oil and canola oil Using herbs and spices instead of salt to flavor food Limiting red meat to no more than a few times a month Eating fish and poultry at least 2 times a week Getting plenty of exercise  BP Education: <120/80; any elevation will warrant further checks  BP can be  elevated by lack of quality sleep OTC cold medications  Hormone Therapy post menopause  Eating to much sodium Processed foods (daily intake 2500mg  or one teaspoon of salt) Drinking to much alcohol  Smoking Lack of physical activity              This is a list of the screening recommended for you and due dates:  Health Maintenance  Topic Date Due  .  Hepatitis C: One time screening is recommended by Center for Disease Control  (CDC) for  adults born from [birth year] through 1965.   10-20-1945  . Urine Protein Check  06[birth year]08/1955  . Complete foot exam   12/18/2013  . Hemoglobin A1C  06/18/2015  . Flu Shot  12/19/2015*  . Eye exam for diabetics  04/23/2016  . Colon Cancer Screening  03/27/2018  . Tetanus Vaccine  06/17/2024  . Shingles Vaccine  Completed  . Pneumonia vaccines  Completed  *Topic was postponed. The date shown is not the original due date.     Fat and Cholesterol Restricted Diet High levels of fat and cholesterol in your blood may lead to various health problems, such as diseases of the heart, blood vessels, gallbladder, liver, and pancreas. Fats are concentrated sources of energy that come in various forms. Certain types of fat, including saturated fat, may be harmful in excess. Cholesterol is a substance needed by your body in small amounts.  Your body makes all the cholesterol it needs. Excess cholesterol comes from the food you eat. When you have high levels of cholesterol and saturated fat in your blood, health problems can develop because the excess fat and cholesterol will gather along the walls of your blood vessels, causing them to narrow. Choosing the right foods will help you control your intake of fat and cholesterol. This will help keep the levels of these substances in your blood within normal limits and reduce your risk of disease. WHAT IS MY PLAN? Your health care provider recommends that you:  Get no more than __________ % of the total calories in your daily  diet from fat.  Limit your intake of saturated fat to less than ______% of your total calories each day.  Limit the amount of cholesterol in your diet to less than _________mg per day. WHAT TYPES OF FAT SHOULD I CHOOSE?  Choose healthy fats more often. Choose monounsaturated and polyunsaturated fats, such as olive and canola oil, flaxseeds, walnuts, almonds, and seeds.  Eat more omega-3 fats. Good choices include salmon, mackerel, sardines, tuna, flaxseed oil, and ground flaxseeds. Aim to eat fish at least two times a week.  Limit saturated fats. Saturated fats are primarily found in animal products, such as meats, butter, and cream. Plant sources of saturated fats include palm oil, palm kernel oil, and coconut oil.  Avoid foods with partially hydrogenated oils in them. These contain trans fats. Examples of foods that contain trans fats are stick margarine, some tub margarines, cookies, crackers, and other baked goods. WHAT GENERAL GUIDELINES DO I NEED TO FOLLOW? These guidelines for healthy eating will help you control your intake of fat and cholesterol:  Check food labels carefully to identify foods with trans fats or high amounts of saturated fat.  Fill one half of your plate with vegetables and green salads.  Fill one fourth of your plate with whole grains. Look for the word "whole" as the first word in the ingredient list.  Fill one fourth of your plate with lean protein foods.  Limit fruit to two servings a day. Choose fruit instead of juice.  Eat more foods that contain soluble fiber. Examples of foods that contain this type of fiber are apples, broccoli, carrots, beans, peas, and barley. Aim to get 20-30 g of fiber per day.  Eat more home-cooked food and less restaurant, buffet, and fast food.  Limit or avoid alcohol.  Limit foods high in starch and sugar.  Limit fried foods.  Cook foods using methods other than frying. Baking, boiling, grilling, and broiling are all  great options.  Lose weight if you are overweight. Losing just 5-10% of your initial body weight can help your overall health and prevent diseases such as diabetes and heart disease. WHAT FOODS CAN I EAT? Grains Whole grains, such as whole wheat or whole grain breads, crackers, cereals, and pasta. Unsweetened oatmeal, bulgur, barley, quinoa, or brown rice. Corn or whole wheat flour tortillas. Vegetables Fresh or frozen vegetables (raw, steamed, roasted, or grilled). Green salads. Fruits All fresh, canned (in natural juice), or frozen fruits. Meat and Other Protein Products Ground beef (85% or leaner), grass-fed beef, or beef trimmed of fat. Skinless chicken or [place]. Ground chicken or [place]. Pork trimmed of fat. All fish and seafood. Eggs. Dried beans, peas, or lentils. Unsalted nuts or seeds. Unsalted canned or dry beans. Dairy Low-fat dairy products, such as skim or 1% milk, 2% or reduced-fat cheeses, low-fat ricotta or cottage cheese,  or plain low-fat yogurt. Fats and Oils Tub margarines without trans fats. Light or reduced-fat mayonnaise and salad dressings. Avocado. Olive, canola, sesame, or safflower oils. Natural peanut or almond butter (choose ones without added sugar and oil). The items listed above may not be a complete list of recommended foods or beverages. Contact your dietitian for more options. WHAT FOODS ARE NOT RECOMMENDED? Grains White bread. White pasta. White rice. Cornbread. Bagels, pastries, and croissants. Crackers that contain trans fat. Vegetables White potatoes. Corn. Creamed or fried vegetables. Vegetables in a cheese sauce. Fruits Dried fruits. Canned fruit in light or heavy syrup. Fruit juice. Meat and Other Protein Products Fatty cuts of meat. Ribs, chicken wings, bacon, sausage, bologna, salami, chitterlings, fatback, hot dogs, bratwurst, and packaged luncheon meats. Liver and organ meats. Dairy Whole or 2% milk, cream, half-and-half, and cream cheese.  Whole milk cheeses. Whole-fat or sweetened yogurt. Full-fat cheeses. Nondairy creamers and whipped toppings. Processed cheese, cheese spreads, or cheese curds. Sweets and Desserts Corn syrup, sugars, honey, and molasses. Candy. Jam and jelly. Syrup. Sweetened cereals. Cookies, pies, cakes, donuts, muffins, and ice cream. Fats and Oils Butter, stick margarine, lard, shortening, ghee, or bacon fat. Coconut, palm kernel, or palm oils. Beverages Alcohol. Sweetened drinks (such as sodas, lemonade, and fruit drinks or punches). The items listed above may not be a complete list of foods and beverages to avoid. Contact your dietitian for more information.   This information is not intended to replace advice given to you by your health care provider. Make sure you discuss any questions you have with your health care provider.   Document Released: 01/17/2005 Document Revised: 02/07/2014 Document Reviewed: 04/17/2013 Elsevier Interactive Patient Education 2016 [place].  Diabetes and Foot Care Diabetes may cause you to have problems because of poor blood supply (circulation) to your feet and legs. This may cause the skin on your feet to become thinner, break easier, and heal more slowly. Your skin may become dry, and the skin may peel and crack. You may also have nerve damage in your legs and feet causing decreased feeling in them. You may not notice minor injuries to your feet that could lead to infections or more serious problems. Taking care of your feet is one of the most important things you can do for yourself.  HOME CARE INSTRUCTIONS  Wear shoes at all times, even in the house. Do not go barefoot. Bare feet are easily injured.  Check your feet daily for blisters, cuts, and redness. If you cannot see the bottom of your feet, use a mirror or ask someone for help.  Wash your feet with warm water (do not use hot water) and mild soap. Then pat your feet and the areas between your toes until they  are completely dry. Do not soak your feet as this can dry your skin.  Apply a moisturizing lotion or petroleum jelly (that does not contain alcohol and is unscented) to the skin on your feet and to dry, brittle toenails. Do not apply lotion between your toes.  Trim your toenails straight across. Do not dig under them or around the cuticle. File the edges of your nails with an emery board or nail file.  Do not cut corns or calluses or try to remove them with medicine.  Wear clean socks or stockings every day. Make sure they are not too tight. Do not wear knee-high stockings since they may decrease blood flow to your legs.  Wear shoes that fit properly and  have enough cushioning. To break in new shoes, wear them for just a few hours a day. This prevents you from injuring your feet. Always look in your shoes before you put them on to be sure there are no objects inside.  Do not cross your legs. This may decrease the blood flow to your feet.  If you find a minor scrape, cut, or break in the skin on your feet, keep it and the skin around it clean and dry. These areas may be cleansed with mild soap and water. Do not cleanse the area with peroxide, alcohol, or iodine.  When you remove an adhesive bandage, be sure not to damage the skin around it.  If you have a wound, look at it several times a day to make sure it is healing.  Do not use heating pads or hot water bottles. They may burn your skin. If you have lost feeling in your feet or legs, you may not know it is happening until it is too late.  Make sure your health care provider performs a complete foot exam at least annually or more often if you have foot problems. Report any cuts, sores, or bruises to your health care provider immediately. SEEK MEDICAL CARE IF:   You have an injury that is not healing.  You have cuts or breaks in the skin.  You have an ingrown nail.  You notice redness on your legs or feet.  You feel burning or  tingling in your legs or feet.  You have pain or cramps in your legs and feet.  Your legs or feet are numb.  Your feet always feel cold. SEEK IMMEDIATE MEDICAL CARE IF:   There is increasing redness, swelling, or pain in or around a wound.  There is a red line that goes up your leg.  Pus is coming from a wound.  You develop a fever or as directed by your health care provider.  You notice a bad smell coming from an ulcer or wound.   This information is not intended to replace advice given to you by your health care provider. Make sure you discuss any questions you have with your health care provider.   Document Released: 01/15/2000 Document Revised: 09/19/2012 Document Reviewed: 06/26/2012 Elsevier Interactive Patient Education 2016 [place] in the Home  Falls can cause injuries. They can happen to people of all ages. There are many things you can do to make your home safe and to help prevent falls.  WHAT CAN I DO ON THE OUTSIDE OF MY HOME?  Regularly fix the edges of walkways and driveways and fix any cracks.  Remove anything that might make you trip as you walk through a door, such as a raised step or threshold.  Trim any bushes or trees on the path to your home.  Use bright outdoor lighting.  Clear any walking paths of anything that might make someone trip, such as rocks or tools.  Regularly check to see if handrails are loose or broken. Make sure that both sides of any steps have handrails.  Any raised decks and porches should have guardrails on the edges.  Have any leaves, snow, or ice cleared regularly.  Use sand or salt on walking paths during winter.  Clean up any spills in your garage right away. This includes oil or grease spills. WHAT CAN I DO IN THE BATHROOM?   Use night lights.  Install grab bars by the toilet and in the  tub and shower. Do not use towel bars as grab bars.  Use non-skid mats or decals in the tub or shower.  If  you need to sit down in the shower, use a plastic, non-slip stool.  Keep the floor dry. Clean up any water that spills on the floor as soon as it happens.  Remove soap buildup in the tub or shower regularly.  Attach bath mats securely with double-sided non-slip rug tape.  Do not have throw rugs and other things on the floor that can make you trip. WHAT CAN I DO IN THE BEDROOM?  Use night lights.  Make sure that you have a light by your bed that is easy to reach.  Do not use any sheets or blankets that are too big for your bed. They should not hang down onto the floor.  Have a firm chair that has side arms. You can use this for support while you get dressed.  Do not have throw rugs and other things on the floor that can make you trip. WHAT CAN I DO IN THE KITCHEN?  Clean up any spills right away.  Avoid walking on wet floors.  Keep items that you use a lot in easy-to-reach places.  If you need to reach something above you, use a strong step stool that has a grab bar.  Keep electrical cords out of the way.  Do not use floor polish or wax that makes floors slippery. If you must use wax, use non-skid floor wax.  Do not have throw rugs and other things on the floor that can make you trip. WHAT CAN I DO WITH MY STAIRS?  Do not leave any items on the stairs.  Make sure that there are handrails on both sides of the stairs and use them. Fix handrails that are broken or loose. Make sure that handrails are as long as the stairways.  Check any carpeting to make sure that it is firmly attached to the stairs. Fix any carpet that is loose or worn.  Avoid having throw rugs at the top or bottom of the stairs. If you do have throw rugs, attach them to the floor with carpet tape.  Make sure that you have a light switch at the top of the stairs and the bottom of the stairs. If you do not have them, ask someone to add them for you. WHAT ELSE CAN I DO TO HELP PREVENT FALLS?  Wear shoes  that:  Do not have high heels.  Have rubber bottoms.  Are comfortable and fit you well.  Are closed at the toe. Do not wear sandals.  If you use a stepladder:  Make sure that it is fully opened. Do not climb a closed stepladder.  Make sure that both sides of the stepladder are locked into place.  Ask someone to hold it for you, if possible.  Clearly mark and make sure that you can see:  Any grab bars or handrails.  First and last steps.  Where the edge of each step is.  Use tools that help you move around (mobility aids) if they are needed. These include:  Canes.  Walkers.  Scooters.  Crutches.  Turn on the lights when you go into a dark area. Replace any light bulbs as soon as they burn out.  Set up your furniture so you have a clear path. Avoid moving your furniture around.  If any of your floors are uneven, fix them.  If there are any pets  around you, be aware of where they are.  Review your medicines with your doctor. Some medicines can make you feel dizzy. This can increase your chance of falling. Ask your doctor what other things that you can do to help prevent falls.   This information is not intended to replace advice given to you by your health care provider. Make sure you discuss any questions you have with your health care provider.   Document Released: 11/13/2008 Document Revised: 06/03/2014 Document Reviewed: 02/21/2014 Elsevier Interactive Patient Education 2016 [place] Maintenance, Male A healthy lifestyle and preventative care can promote health and wellness.  Maintain regular health, dental, and eye exams.  Eat a healthy diet. Foods like vegetables, fruits, whole grains, low-fat dairy products, and lean protein foods contain the nutrients you need and are low in calories. Decrease your intake of foods high in solid fats, added sugars, and salt. Get information about a proper diet from your health care provider, if  necessary.  Regular physical exercise is one of the most important things you can do for your health. Most adults should get at least 150 minutes of moderate-intensity exercise (any activity that increases your heart rate and causes you to sweat) each week. In addition, most adults need muscle-strengthening exercises on 2 or more days a week.   Maintain a healthy weight. The body mass index (BMI) is a screening tool to identify possible weight problems. It provides an estimate of body fat based on height and weight. Your health care provider can find your BMI and can help you achieve or maintain a healthy weight. For males 20 years and older:  A BMI below 18.5 is considered underweight.  A BMI of 18.5 to 24.9 is normal.  A BMI of 25 to 29.9 is considered overweight.  A BMI of 30 and above is considered obese.  Maintain normal blood lipids and cholesterol by exercising and minimizing your intake of saturated fat. Eat a balanced diet with plenty of fruits and vegetables. Blood tests for lipids and cholesterol should begin at age 21 and be repeated every 5 years. If your lipid or cholesterol levels are high, you are over age 29, or you are at high risk for heart disease, you may need your cholesterol levels checked more frequently.Ongoing high lipid and cholesterol levels should be treated with medicines if diet and exercise are not working.  If you smoke, find out from your health care provider how to quit. If you do not use tobacco, do not start.  Lung cancer screening is recommended for adults aged 40-80 years who are at high risk for developing lung cancer because of a history of smoking. A yearly low-dose CT scan of the lungs is recommended for people who have at least a 30-pack-year history of smoking and are current smokers or have quit within the past 15 years. A pack year of smoking is smoking an average of 1 pack of cigarettes a day for 1 year (for example, a 30-pack-year history of  smoking could mean smoking 1 pack a day for 30 years or 2 packs a day for 15 years). Yearly screening should continue until the smoker has stopped smoking for at least 15 years. Yearly screening should be stopped for people who develop a health problem that would prevent them from having lung cancer treatment.  If you choose to drink alcohol, do not have more than 2 drinks per day. One drink is considered to be 12 oz (360 mL) of  beer, 5 oz (150 mL) of wine, or 1.5 oz (45 mL) of liquor.  Avoid the use of street drugs. Do not share needles with anyone. Ask for help if you need support or instructions about stopping the use of drugs.  High blood pressure causes heart disease and increases the risk of stroke. High blood pressure is more likely to develop in:  People who have blood pressure in the end of the normal range (100-139/85-89 mm Hg).  People who are overweight or obese.  People who are African American.  If you are 52-3 years of age, have your blood pressure checked every 3-5 years. If you are 59 years of age or older, have your blood pressure checked every year. You should have your blood pressure measured twice--once when you are at a hospital or clinic, and once when you are not at a hospital or clinic. Record the average of the two measurements. To check your blood pressure when you are not at a hospital or clinic, you can use:  An automated blood pressure machine at a pharmacy.  A home blood pressure monitor.  If you are 45-53 years old, ask your health care provider if you should take aspirin to prevent heart disease.  Diabetes screening involves taking a blood sample to check your fasting blood sugar level. This should be done once every 3 years after age 64 if you are at a normal weight and without risk factors for diabetes. Testing should be considered at a younger age or be carried out more frequently if you are overweight and have at least 1 risk factor for  diabetes.  Colorectal cancer can be detected and often prevented. Most routine colorectal cancer screening begins at the age of 78 and continues through age 23. However, your health care provider may recommend screening at an earlier age if you have risk factors for colon cancer. On a yearly basis, your health care provider may provide home test kits to check for hidden blood in the stool. A small camera at the end of a tube may be used to directly examine the colon (sigmoidoscopy or colonoscopy) to detect the earliest forms of colorectal cancer. Talk to your health care provider about this at age 38 when routine screening begins. A direct exam of the colon should be repeated every 5-10 years through age 23, unless early forms of precancerous polyps or small growths are found.  People who are at an increased risk for hepatitis B should be screened for this virus. You are considered at high risk for hepatitis B if:  You were born in a country where hepatitis B occurs often. Talk with your health care provider about which countries are considered high risk.  Your parents were born in a high-risk country and you have not received a shot to protect against hepatitis B (hepatitis B vaccine).  You have HIV or AIDS.  You use needles to inject street drugs.  You live with, or have sex with, someone who has hepatitis B.  You are a man who has sex with other men (MSM).  You get hemodialysis treatment.  You take certain medicines for conditions like cancer, organ transplantation, and autoimmune conditions.  Hepatitis C blood testing is recommended for all people born from [birth year] through 1965 and any individual with known risk factors for hepatitis C.  Healthy men should no longer receive prostate-specific antigen (PSA) blood tests as part of routine cancer screening. Talk to your health care provider about prostate cancer  screening.  Testicular cancer screening is not recommended for adolescents or  adult males who have no symptoms. Screening includes self-exam, a health care provider exam, and other screening tests. Consult with your health care provider about any symptoms you have or any concerns you have about testicular cancer.  Practice safe sex. Use condoms and avoid high-risk sexual practices to reduce the spread of sexually transmitted infections (STIs).  You should be screened for STIs, including gonorrhea and chlamydia if:  You are sexually active and are younger than 24 years.  You are older than 24 years, and your health care provider tells you that you are at risk for this type of infection.  Your sexual activity has changed since you were last screened, and you are at an increased risk for chlamydia or gonorrhea. Ask your health care provider if you are at risk.  If you are at risk of being infected with HIV, it is recommended that you take a prescription medicine daily to prevent HIV infection. This is called pre-exposure prophylaxis (PrEP). You are considered at risk if:  You are a man who has sex with other men (MSM).  You are a heterosexual man who is sexually active with multiple partners.  You take drugs by injection.  You are sexually active with a partner who has HIV.  Talk with your health care provider about whether you are at high risk of being infected with HIV. If you choose to begin PrEP, you should first be tested for HIV. You should then be tested every 3 months for as long as you are taking PrEP.  Use sunscreen. Apply sunscreen liberally and repeatedly throughout the day. You should seek shade when your shadow is shorter than you. Protect yourself by wearing long sleeves, pants, a wide-brimmed hat, and sunglasses year round whenever you are outdoors.  Tell your health care provider of new moles or changes in moles, especially if there is a change in shape or color. Also, tell your health care provider if a mole is larger than the size of a pencil  eraser.  A one-time screening for abdominal aortic aneurysm (AAA) and surgical repair of large AAAs by ultrasound is recommended for men aged 21-75 years who are current or former smokers.  Stay current with your vaccines (immunizations).   This information is not intended to replace advice given to you by your health care provider. Make sure you discuss any questions you have with your health care provider.   Document Released: 07/16/2007 Document Revised: 02/07/2014 Document Reviewed: 06/14/2010 Elsevier Interactive Patient Education [employer].

## 2015-07-08 DIAGNOSIS — G4733 Obstructive sleep apnea (adult) (pediatric): Secondary | ICD-10-CM | POA: Diagnosis not present

## 2015-07-14 ENCOUNTER — Encounter: Payer: Medicare Other | Attending: Internal Medicine | Admitting: Dietician

## 2015-07-14 ENCOUNTER — Encounter: Payer: Self-pay | Admitting: Dietician

## 2015-07-14 VITALS — Ht 71.0 in | Wt 214.0 lb

## 2015-07-14 DIAGNOSIS — E119 Type 2 diabetes mellitus without complications: Secondary | ICD-10-CM | POA: Insufficient documentation

## 2015-07-14 DIAGNOSIS — E118 Type 2 diabetes mellitus with unspecified complications: Secondary | ICD-10-CM

## 2015-07-14 NOTE — Progress Notes (Signed)
Diabetes Self-Management Education  Visit Type: First/Initial  Appt. Start Time: 0800 Appt. End Time: 0915  07/14/2015  [name], identified by name and date of birth, is a 70 y.o. male with a diagnosis of Diabetes: Type 2.   Patient is here alone.  He would like to learn how to better control his blood sugar and states that it seems that consistent exercise is the key and has more difficulty with this in the winter.  He states that his father had diabetes and lost a leg.  Other hx includes GERD, hyperlipidemia, OSA on C-pap and a history of esophageal and stomach cancer in 2012.  He has been able to maintain his weight since starting on the C-pap.  He lives with his wife.  They share shopping and cooking.  His wife has a L-VAD.  They prepare foods without added salt and he adds only a little during the summer.  He uses very little refined sugar.  ASSESSMENT  Height 5\' 11"  (1.803 m), weight 214 lb (97.07 kg). Body mass index is 29.86 kg/(m^2).      Diabetes Self-Management Education - 07/14/15 0802    Visit Information   Visit Type First/Initial   Initial Visit   Diabetes Type Type 2   Are you currently following a meal plan? No   Are you taking your medications as prescribed? Yes   Date Diagnosed 2007   Health Coping   How would you rate your overall health? Good   Psychosocial Assessment   Patient Belief/Attitude about Diabetes Afraid   Self-care barriers None   Self-management support Doctor's office   Other persons present Patient   Patient Concerns Glycemic Control;Nutrition/Meal planning   Special Needs None   Preferred Learning Style No preference indicated   Learning Readiness Ready   How often do you need to have someone help you when you read instructions, pamphlets, or other written materials from your doctor or pharmacy? 1 - Never   What is the last grade level you completed in school? 3 years ciollage   Pre-Education Assessment   Patient understands  the diabetes disease and treatment process. Needs Review   Patient understands incorporating nutritional management into lifestyle. Needs Review   Patient undertands incorporating physical activity into lifestyle. Needs Review   Patient understands using medications safely. Demonstrates understanding / competency   Patient understands monitoring blood glucose, interpreting and using results Demonstrates understanding / competency   Patient understands prevention, detection, and treatment of acute complications. Demonstrates understanding / competency   Patient understands prevention, detection, and treatment of chronic complications. Demonstrates understanding / competency   Patient understands how to develop strategies to address psychosocial issues. Demonstrates understanding / competency   Patient understands how to develop strategies to promote health/change behavior. Needs Review   Complications   Last HgB A1C per patient/outside source 8.1 %  11/16   How often do you check your blood sugar? 1-2 times/day   Fasting Blood glucose range (mg/dL) 70-129   Number of hypoglycemic episodes per month 0   Number of hyperglycemic episodes per week 0   Have you had a dilated eye exam in the past 12 months? Yes   Have you had a dental exam in the past 12 months? Yes   Are you checking your feet? Yes   How many days per week are you checking your feet? 7   Dietary Intake   Breakfast fiber one cereal with whole milk, or the cereal in smoothie, grapes or  berries and/or banana  8-9   Lunch lunch meat or leftover sandwich on [employer] or rye bread and soup or veges, potatoes or rice  12-1   Snack (afternoon) nuts, fruit   Dinner vegetables, salad, chicken, cornbread  6   Snack (evening) alcohol mixed with fruit smoothie or juice  before 8   Beverage(s) coffee, diet    green tea, diet soda, water, variety of alcohol (3 shots per week) mixed with juice or tea or diet soda or watermelon or canteloupe  smoothie   Exercise   Exercise Type Light (walking / raking leaves)  stationary bike, light weights, yard work   How many days per week to you exercise? 6   How many minutes per day do you exercise? 30   Total minutes per week of exercise 180   Patient Education   Previous Diabetes Education Yes (please comment)  1- years ago saw RD   Disease state  Definition of diabetes, type 1 and 2, and the diagnosis of diabetes;Explored patient's options for treatment of their diabetes   Nutrition management  Role of diet in the treatment of diabetes and the relationship between the three main macronutrients and blood glucose level;Food label reading, portion sizes and measuring food.;Meal options for control of blood glucose level and chronic complications.   Physical activity and exercise  Role of exercise on diabetes management, blood pressure control and cardiac health.   Monitoring Identified appropriate SMBG and/or A1C goals.;Yearly dilated eye exam   Individualized Goals (developed by patient)   Nutrition General guidelines for healthy choices and portions discussed   Physical Activity Exercise 5-7 days per week;30 minutes per day   Medications take my medication as prescribed   Monitoring  test my blood glucose as discussed  Consider rotating and checking after different meals.   Reducing Risk examine blood glucose patterns   Health Coping Not Applicable   Post-Education Assessment   Patient understands the diabetes disease and treatment process. Demonstrates understanding / competency   Patient understands incorporating nutritional management into lifestyle. Demonstrates understanding / competency   Patient undertands incorporating physical activity into lifestyle. Demonstrates understanding / competency   Patient understands using medications safely. Demonstrates understanding / competency   Patient understands monitoring blood glucose, interpreting and using results Demonstrates  understanding / competency   Patient understands prevention, detection, and treatment of acute complications. Demonstrates understanding / competency   Patient understands prevention, detection, and treatment of chronic complications. Demonstrates understanding / competency   Patient understands how to develop strategies to address psychosocial issues. Demonstrates understanding / competency   Patient understands how to develop strategies to promote health/change behavior. Demonstrates understanding / competency   Outcomes   Expected Outcomes Demonstrated interest in learning. Expect positive outcomes   Future DMSE PRN   Program Status Completed      Individualized Plan for Diabetes Self-Management Training:   Learning Objective:  Patient will have a greater understanding of diabetes self-management. Patient education plan is to attend individual and/or group sessions per assessed needs and concerns.   Plan:   Patient Instructions  Continue your healthy eating habits. Continue to stay active most days.  Be sure in the winter to find something you enjoy for exercise.  Aim for at least 30 minutes most days. Watch your intake of juice and fruit.  Be mindful of the portion size.  Generally avoid drinking carbohydrates.  Aim for 4 Carb Choices per meal (60 grams) +/- 1 either way  Aim for 0-2  Carbs per snack if hungry  Include protein in moderation with your meals and snacks Consider reading food labels for Total Carbohydrate and Fat Grams of foods Consider checking BG at alternate times per day as directed by MD  Consider taking medication as directed by MD    Expected Outcomes:  Demonstrated interest in learning. Expect positive outcomes  Education material provided: Living Well with Diabetes, Food label handouts, A1C conversion sheet, Meal plan card, My Plate and Snack sheet  If problems or questions, patient to contact team via:  Phone and Email  Future DSME appointment: PRN

## 2015-07-14 NOTE — Patient Instructions (Signed)
Continue your healthy eating habits. Continue to stay active most days.  Be sure in the winter to find something you enjoy for exercise.  Aim for at least 30 minutes most days. Watch your intake of juice and fruit.  Be mindful of the portion size.  Generally avoid drinking carbohydrates.  Aim for 4 Carb Choices per meal (60 grams) +/- 1 either way  Aim for 0-2 Carbs per snack if hungry  Include protein in moderation with your meals and snacks Consider reading food labels for Total Carbohydrate and Fat Grams of foods Consider checking BG at alternate times per day as directed by MD  Consider taking medication as directed by MD

## 2015-09-15 ENCOUNTER — Ambulatory Visit: Payer: Medicare Other | Admitting: Nurse Practitioner

## 2015-09-15 ENCOUNTER — Other Ambulatory Visit: Payer: Medicare Other

## 2015-09-16 ENCOUNTER — Telehealth: Payer: Self-pay

## 2015-09-16 NOTE — Telephone Encounter (Signed)
Pt called stating he just forgot his appt and wants to r/s. inbasket sent.

## 2015-09-21 ENCOUNTER — Other Ambulatory Visit: Payer: Self-pay | Admitting: *Deleted

## 2015-09-21 MED ORDER — METFORMIN HCL 500 MG PO TABS: 500.0000 mg | ORAL_TABLET | Freq: Three times a day (TID) | ORAL | 0 refills | Status: DC

## 2015-10-13 ENCOUNTER — Other Ambulatory Visit: Payer: Medicare Other

## 2015-10-13 ENCOUNTER — Ambulatory Visit: Payer: Medicare Other | Admitting: Nurse Practitioner

## 2015-10-21 ENCOUNTER — Other Ambulatory Visit (HOSPITAL_BASED_OUTPATIENT_CLINIC_OR_DEPARTMENT_OTHER): Payer: Medicare Other

## 2015-10-21 ENCOUNTER — Ambulatory Visit (HOSPITAL_BASED_OUTPATIENT_CLINIC_OR_DEPARTMENT_OTHER): Payer: Medicare Other | Admitting: Nurse Practitioner

## 2015-10-21 ENCOUNTER — Telehealth: Payer: Self-pay | Admitting: Nurse Practitioner

## 2015-10-21 VITALS — BP 126/76 | HR 57 | Temp 98.8°F | Resp 16 | Ht 71.0 in | Wt 216.8 lb

## 2015-10-21 DIAGNOSIS — E119 Type 2 diabetes mellitus without complications: Secondary | ICD-10-CM

## 2015-10-21 DIAGNOSIS — C160 Malignant neoplasm of cardia: Secondary | ICD-10-CM

## 2015-10-21 DIAGNOSIS — G4730 Sleep apnea, unspecified: Secondary | ICD-10-CM

## 2015-10-21 DIAGNOSIS — E785 Hyperlipidemia, unspecified: Secondary | ICD-10-CM

## 2015-10-21 DIAGNOSIS — Z8509 Personal history of malignant neoplasm of other digestive organs: Secondary | ICD-10-CM | POA: Diagnosis not present

## 2015-10-21 DIAGNOSIS — D649 Anemia, unspecified: Secondary | ICD-10-CM | POA: Diagnosis not present

## 2015-10-21 LAB — CEA (IN HOUSE-CHCC): CEA (CHCC-In House): 1.37 ng/mL (ref 0.00–5.00)

## 2015-10-21 NOTE — Telephone Encounter (Signed)
Avs report and appointment schedule given to patient,per 10/21/15 los. °

## 2015-10-21 NOTE — Progress Notes (Signed)
[facility] OFFICE PROGRESS NOTE   Diagnosis:  Gastroesophageal carcinoma  INTERVAL HISTORY:   Mr. Dematteo returns as scheduled. He feels well. No complaints. He denies dysphagia. No pain. No shortness of breath. Bowels moving regularly. He has a good appetite.  Objective:  Vital signs in last 24 hours:  Blood pressure 126/76, pulse (!) 57, temperature 98.8 F (37.1 C), temperature source Oral, resp. rate 16, height 5\' 11"  (1.803 m), weight 216 lb 12.8 oz (98.3 kg), SpO2 98 %.    HEENT: No thrush or ulcers. No neck mass. Lymphatics: No palpable cervical, supraclavicular or axillary lymph nodes. Resp: Lungs clear bilaterally. Cardio: Regular rate and rhythm. GI: Abdomen soft and nontender. No hepatomegaly. Vascular: No leg edema.   Lab Results:  Lab Results  Component Value Date   WBC 7.0 06/19/2014   HGB 15.8 06/19/2014   HCT 46.4 06/19/2014   MCV 84.0 06/19/2014   PLT 327.0 06/19/2014   NEUTROABS 4.3 06/19/2014    Imaging:  No results found.  Medications: I have reviewed the patient's current medications.  Assessment/Plan: 1. 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 Staging CT scan 08/27/2010 revealed small gastrohepatic ligament, periportal and retrocrural lymph nodes without other evidence of metastatic disease. Staging PET scan on 09/21/2010 showed abnormal malignant range FDG uptake at the site of the biopsy-proven gastric cancer. Small perigastric lymph nodes were not FDG avid above the background level. The radiologist commented that this may reflect a benign etiology or a false-negative appearance due to the small lymph node size. There was no evidence for distant metastatic disease. The CEA was markedly elevated at 482 on 09/14/2010. He completed radiation 09/20/2010 through 10/29/2010. He received concurrent weekly Taxol/carboplatin chemotherapy  09/22/2010 through 10/27/2010. Restaging PET scan 11/22/2010 showed interval resolution of previously demonstrated hypermetabolic activity within the proximal stomach. There was no evidence of metastatic disease. Restaging PET scan 05/10/2011 showed no evidence of gastroesophageal carcinoma recurrence or metastasis. 2. Status post upper endoscopy 12/17/2010-the very distal portion of the esophagus was ulcerated. The proximal stomach revealed a malignant appearing ulcerative mass in the region of the cardia/gastroesophageal junction. The overall bulk of the lesion was significantly improved but still quite abnormal and large. Multiple biopsies were taken. Pathology was negative for malignancy. 3. Elevated CEA (482) on 09/14/2010.  4. Dysphagia secondary to the lower esophagus/upper gastric tumor, resolved. 5. Microcytic anemia. The hemoglobin was normal on 03/31/2011. There was persistent red cell microcytosis. Hemoglobin and MCV within normal range on 06/19/2014. 6. Sleep apnea. 7. Early diabetes. 8. History of atrial fibrillation. 9. Hyperlipidemia. 10. History of colon polyps. 11. History of Helicobacter pylori infection in 2011. 12. History of neutropenia secondary to chemotherapy, resolved. 13. CT of the chest on 02/15/2011 with left lower lung density/air bronchograms-? Radiation change. 14. Colonoscopy 03/27/2013. Mild melanosis found throughout the entire examined colon. 2 diminutive polyps found in the descending colon (Tubular adenoma. No high-grade dysplasia or malignancy noted). Moderate diverticulosis in the left colon. Followup colonoscopy in 5 years. 15. Nodular hyperpigmented mole at the right upper back 03/25/2014. He reports the mole has been evaluated.   Disposition: Jon [name] remains in clinical remission from gastroesophageal carcinoma. We will follow-up on the CEA from today. He will return for an office visit and CEA in 8 months. He will contact the office in the interim  with any problems.  Plan reviewed with Dr. Benay Spice.    Ned Card ANP/GNP-BC  10/21/2015  2:19 PM

## 2015-10-22 ENCOUNTER — Telehealth: Payer: Self-pay | Admitting: *Deleted

## 2015-10-22 LAB — CEA: CEA1: 3.8 ng/mL (ref 0.0–4.7)

## 2015-10-22 NOTE — Telephone Encounter (Signed)
Per Dr. Benay Spice, patient notified that cea is normal.  Patient appreciative of call and has no questions or concerns at this time.

## 2015-10-22 NOTE — Telephone Encounter (Signed)
-----   Message from Ladell Pier, MD sent at 10/21/2015  9:25 PM EDT ----- Please call patient, Jon [name] is normal

## 2015-10-23 ENCOUNTER — Telehealth: Payer: Self-pay | Admitting: *Deleted

## 2015-10-23 NOTE — Telephone Encounter (Signed)
-----   Message from Ladell Pier, MD sent at 10/22/2015  8:36 PM EDT ----- Please call patient, cea is normal

## 2015-10-23 NOTE — Telephone Encounter (Signed)
Per Dr. Benay Spice, patient notified that cea is normal.  Patient appreciative of call and has no questions or concerns at this time.

## 2015-11-02 ENCOUNTER — Ambulatory Visit: Payer: Medicare Other | Admitting: Pulmonary Disease

## 2015-11-05 ENCOUNTER — Encounter: Payer: Self-pay | Admitting: Pulmonary Disease

## 2015-11-05 ENCOUNTER — Ambulatory Visit (INDEPENDENT_AMBULATORY_CARE_PROVIDER_SITE_OTHER): Payer: Medicare Other | Admitting: Pulmonary Disease

## 2015-11-05 DIAGNOSIS — G4733 Obstructive sleep apnea (adult) (pediatric): Secondary | ICD-10-CM | POA: Diagnosis not present

## 2015-11-05 DIAGNOSIS — J700 Acute pulmonary manifestations due to radiation: Secondary | ICD-10-CM | POA: Diagnosis not present

## 2015-11-05 NOTE — Addendum Note (Signed)
Addended by: Mathis Dad on: 11/05/2015 02:29 PM   Modules accepted: Orders

## 2015-11-05 NOTE — Assessment & Plan Note (Signed)
Stable,  He does not want the flu shot today His pneumonia vaccinations are up-to-date

## 2015-11-05 NOTE — Assessment & Plan Note (Signed)
His current device is 70 years old but seems to be working well. He will call us for a new machine if this malfunctions.  compliance with goal of at least 4-6 hrs every night is the expectation. Advised against medications with sedative side effects Cautioned against driving when sleepy - understanding that sleepiness will vary on a day to day basis

## 2015-11-05 NOTE — Patient Instructions (Signed)
Call us if you have any trouble with your machine

## 2015-11-05 NOTE — Progress Notes (Signed)
   Subjective:    Patient ID: Jon [name], male    DOB: 01-Jun-1945, 70 y.o.   MRN: [id number]  HPI  70 year old for follow-up of OSA He underwent chemo-RT for esophageal adenoCA in 2012 ([facility]rill & Hen[facility] Pastor) complicated by LLL radiation pneumonitis  11/05/2015  Chief Complaint  Patient presents with  . Follow-up    Doing well on CPAP.  no concerns.  (no download available)    He has resmed Escape device with nasal pillows - He has this device for 10 years  Denies any problems with mask or device He has been getting supplies from DME without any issues No sleepiness, denies snoring. Feels rested, no dryness  Download 2016  showed good usage , ? pr No download available this time since he did not bring the cord  He denies any dyspnea or cough Chest x-ray 11/2014 showed minimal left basal scarring   NPSG 2007:  AHI 74/hr On cpap  Review of Systems Patient denies significant dyspnea,cough, hemoptysis,  chest pain, palpitations, pedal edema, orthopnea, paroxysmal nocturnal dyspnea, lightheadedness, nausea, vomiting, abdominal or  leg pains      Objective:   Physical Exam  Gen. Pleasant, well-nourished, in no distress ENT - no lesions, no post nasal drip Neck: No JVD, no thyromegaly, no carotid bruits Lungs: no use of accessory muscles, no dullness to percussion, clear without rales or rhonchi  Cardiovascular: Rhythm regular, heart sounds  normal, no murmurs or gallops, no peripheral edema Musculoskeletal: No deformities, no cyanosis or clubbing        Assessment & Plan:

## 2015-11-16 ENCOUNTER — Other Ambulatory Visit: Payer: Self-pay | Admitting: *Deleted

## 2015-11-16 MED ORDER — METFORMIN HCL 500 MG PO TABS: 500.0000 mg | ORAL_TABLET | Freq: Three times a day (TID) | ORAL | 1 refills | Status: DC

## 2015-11-16 NOTE — Telephone Encounter (Signed)
Rec'd call pt states pharmacy told him his medication refill was denied, and wanting to know why. Verified med inform pt due to record he is over due for appt. He states he has made his yearly appt for Nov 20th. Inform can send enough until he see MD on 11/20. Rx sent to [facility]...Jon [name]

## 2015-11-23 ENCOUNTER — Emergency Department (HOSPITAL_COMMUNITY)
Admission: EM | Admit: 2015-11-23 | Discharge: 2015-11-24 | Disposition: A | Discharge status: Home / Self Care | Payer: Medicare Other | Attending: Emergency Medicine | Admitting: Emergency Medicine

## 2015-11-23 ENCOUNTER — Other Ambulatory Visit: Payer: Self-pay

## 2015-11-23 ENCOUNTER — Encounter (HOSPITAL_COMMUNITY): Payer: Self-pay | Admitting: Emergency Medicine

## 2015-11-23 ENCOUNTER — Emergency Department (HOSPITAL_COMMUNITY): Payer: Medicare Other

## 2015-11-23 DIAGNOSIS — Z7982 Long term (current) use of aspirin: Secondary | ICD-10-CM | POA: Insufficient documentation

## 2015-11-23 DIAGNOSIS — Z8501 Personal history of malignant neoplasm of esophagus: Secondary | ICD-10-CM | POA: Insufficient documentation

## 2015-11-23 DIAGNOSIS — Z7984 Long term (current) use of oral hypoglycemic drugs: Secondary | ICD-10-CM | POA: Diagnosis not present

## 2015-11-23 DIAGNOSIS — E119 Type 2 diabetes mellitus without complications: Secondary | ICD-10-CM | POA: Diagnosis not present

## 2015-11-23 DIAGNOSIS — Z85828 Personal history of other malignant neoplasm of skin: Secondary | ICD-10-CM | POA: Diagnosis not present

## 2015-11-23 DIAGNOSIS — R0789 Other chest pain: Secondary | ICD-10-CM | POA: Diagnosis not present

## 2015-11-23 DIAGNOSIS — T782XXA Anaphylactic shock, unspecified, initial encounter: Secondary | ICD-10-CM | POA: Diagnosis not present

## 2015-11-23 DIAGNOSIS — R21 Rash and other nonspecific skin eruption: Secondary | ICD-10-CM | POA: Diagnosis present

## 2015-11-23 DIAGNOSIS — Z87891 Personal history of nicotine dependence: Secondary | ICD-10-CM | POA: Diagnosis not present

## 2015-11-23 DIAGNOSIS — R0602 Shortness of breath: Secondary | ICD-10-CM | POA: Diagnosis not present

## 2015-11-23 LAB — COMPREHENSIVE METABOLIC PANEL
ALBUMIN: 4.4 g/dL (ref 3.5–5.0)
ALT: 29 U/L (ref 17–63)
ANION GAP: 10 (ref 5–15)
AST: 34 U/L (ref 15–41)
Alkaline Phosphatase: 52 U/L (ref 38–126)
BILIRUBIN TOTAL: 0.8 mg/dL (ref 0.3–1.2)
BUN: 16 mg/dL (ref 6–20)
CHLORIDE: 102 mmol/L (ref 101–111)
CO2: 25 mmol/L (ref 22–32)
Calcium: 9.4 mg/dL (ref 8.9–10.3)
Creatinine, Ser: 1.52 mg/dL — ABNORMAL HIGH (ref 0.61–1.24)
GFR calc Af Amer: 52 mL/min — ABNORMAL LOW (ref >60)
GFR, EST NON AFRICAN AMERICAN: 45 mL/min — AB (ref >60)
Glucose, Bld: 186 mg/dL — ABNORMAL HIGH (ref 65–99)
POTASSIUM: 4.1 mmol/L (ref 3.5–5.1)
Sodium: 137 mmol/L (ref 135–145)
TOTAL PROTEIN: 6.7 g/dL (ref 6.5–8.1)

## 2015-11-23 LAB — CBC WITH DIFFERENTIAL/PLATELET
BASOS ABS: 0 10*3/uL (ref 0.0–0.1)
BASOS PCT: 0 %
EOS PCT: 2 %
Eosinophils Absolute: 0.2 10*3/uL (ref 0.0–0.7)
HEMATOCRIT: 49.6 % (ref 39.0–52.0)
Hemoglobin: 17.4 g/dL — ABNORMAL HIGH (ref 13.0–17.0)
Lymphocytes Relative: 35 %
Lymphs Abs: 3.3 10*3/uL (ref 0.7–4.0)
MCH: 29 pg (ref 26.0–34.0)
MCHC: 35.1 g/dL (ref 30.0–36.0)
MCV: 82.7 fL (ref 78.0–100.0)
MONO ABS: 0.6 10*3/uL (ref 0.1–1.0)
MONOS PCT: 6 %
NEUTROS ABS: 5.4 10*3/uL (ref 1.7–7.7)
Neutrophils Relative %: 57 %
PLATELETS: 342 10*3/uL (ref 150–400)
RBC: 6 MIL/uL — ABNORMAL HIGH (ref 4.22–5.81)
RDW: 14.4 % (ref 11.5–15.5)
WBC: 9.5 10*3/uL (ref 4.0–10.5)

## 2015-11-23 MED ORDER — METHYLPREDNISOLONE SODIUM SUCC 125 MG IJ SOLR
125.0000 mg | Freq: Once | INTRAMUSCULAR | Status: AC
  Administered 2015-11-23: 125 mg via INTRAVENOUS
  Filled 2015-11-23: qty 2

## 2015-11-23 MED ORDER — FAMOTIDINE IN NACL 20-0.9 MG/50ML-% IV SOLN
20.0000 mg | Freq: Once | INTRAVENOUS | Status: AC
  Administered 2015-11-23: 20 mg via INTRAVENOUS
  Filled 2015-11-23: qty 50

## 2015-11-23 MED ORDER — EPINEPHRINE 0.3 MG/0.3ML IJ SOAJ
INTRAMUSCULAR | Status: AC
  Administered 2015-11-23: 0.3 mg
  Filled 2015-11-23: qty 0.3

## 2015-11-23 MED ORDER — DIPHENHYDRAMINE HCL 50 MG/ML IJ SOLN
50.0000 mg | Freq: Once | INTRAMUSCULAR | Status: AC
  Administered 2015-11-23: 50 mg via INTRAVENOUS
  Filled 2015-11-23: qty 1

## 2015-11-23 MED ORDER — EPINEPHRINE PF 1 MG/10ML IJ SOSY
0.3000 mg | PREFILLED_SYRINGE | Freq: Once | INTRAMUSCULAR | Status: DC
  Filled 2015-11-23: qty 10

## 2015-11-23 NOTE — ED Notes (Signed)
Dr. Billy Fischer ( EDP ) notified / evaluated pt. At triage .

## 2015-11-23 NOTE — ED Triage Notes (Signed)
Pt. reports itchy generalized skin rashes/hives this evening with SOB , airway intact / no oral swelling .

## 2015-11-24 MED ORDER — PREDNISONE 10 MG PO TABS: 30.0000 mg | ORAL_TABLET | Freq: Every day | ORAL | 0 refills | Status: AC

## 2015-11-24 MED ORDER — EPINEPHRINE 0.3 MG/0.3ML IJ SOAJ: 0.3000 mg | Freq: Once | INTRAMUSCULAR | 1 refills | Status: AC

## 2015-11-24 NOTE — ED Provider Notes (Signed)
**Jon [name] De-Identified via Obfuscation** Jon Jon [name]   CSN: [id number] Arrival date & time: 11/23/15  2029     History   Chief Complaint Chief Complaint  Patient presents with  . Urticaria  . Shortness of Breath    HPI Jon Jon [name] is a 70 y.o. Jon [name].  HPI   70yo Jon [name] with history of gastroesophageal cancer in remission, hyperlipidemia, atrial fibrillation, DM presents with sudden onset Jon [name], sensation of tightness in upper chest. Symptoms began together around 730PM. Severe skin itching diffusely and Jon [name]. No n/v/lightheadedness/throat swelling/change in voice/dysphagia.  No hx of allergic reaction in past. Had red cabbage at 4PM. Reports he was around a dog at the time but has not had allergic reactions to them in the past. Denies known other trigger related to food, pets, insect bites, nor medications.   Past Medical History:  Diagnosis Date  . Adenomatous colon polyp   . Anemia   . Atrial fibrillation ([place])   . Cataract   . Diabetes mellitus without complication ([place])   . Diverticulosis   . 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[place]) 2012   Gastroesophageal /radiation Tx and Chemo  . GERD (gastroesophageal reflux disease)   . Glaucoma   . H. pylori infection 2011  . Heart murmur   . History of radiation therapy 09/20/10 thru 10/29/10   gastroesophageal junction/gastric cardia  . Other and unspecified hyperlipidemia   . Sleep apnea   . Status post chemotherapy 09/22/10 thru 10/27/10   concurrent w/radiation  . Unspecified sleep apnea     Patient Active Problem List   Diagnosis Date Noted  . Erectile dysfunction 06/18/2013  . Piriformis syndrome of right side 01/08/2013  . Diabetes mellitus, type 2 ([place]) 02/16/2012  . Shoulder pain, right 10/17/2011  . Malignant neoplasm of other specified part of esophagus 09/03/2011  . Well adult exam 07/13/2011  . 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[place])   . 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[place])   . Status post chemotherapy   . 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 Dysphagia 07/09/2010  . Hypertension 07/09/2010  . Neoplasm of uncertain behavior of skin 12/31/2009  . CERUMEN IMPACTION 12/31/2009  . HYPERGLYCEMIA 12/31/2009  . VERTIGO 11/13/2009  . HELICOBACTER PYLORI INFECTION 06/30/2009  . GERD 06/16/2009  . CHEST PAIN 06/16/2009  . Obstructive sleep apnea 10/31/2008  . POLYP, COLON 10/31/2007  . Dyslipidemia 10/31/2007  . ATRIAL FIBRILLATION, HX OF 10/31/2007    Past Surgical History:  Procedure Laterality Date  . CATARACT EXTRACTION     bilateral  . COLONOSCOPY    . ELBOW BURSA SURGERY    . LEG SURGERY  1980   right leg   . SKIN BIOPSY  08/22/11   shave biopsy =benign  . TONSILLECTOMY         Home Medications    Prior to Admission medications   Medication Sig Start Date End Date Taking? Authorizing Provider  aspirin 325 MG EC tablet Take 325 mg by mouth daily. 05/10/12  Yes Historical Provider, MD  Cholecalciferol 1000 UNITS tablet Take 1,000 Units by mouth daily.    Yes Historical Provider, MD  diltiazem (CARDIZEM CD) 240 MG 24 hr capsule Take 1 capsule (240 mg total) by mouth daily. 06/18/15  Yes [name], MD  fenofibrate micronized (LOFIBRA) 134 MG capsule Take 1 capsule (134 mg total) by mouth daily before breakfast. 04/16/15  Yes [name], MD  metFORMIN (GLUCOPHAGE) 500 MG tablet Take 1 tablet (500 mg total) by mouth 3 (three) times daily. Must keep Nov 20th  appt for future refills 11/16/15  Yes [name], MD  Multiple Vitamin (MULTIVITAMIN WITH MINERALS) TABS tablet Take 1 tablet by mouth daily.   Yes Historical Provider, MD  omeprazole (PRILOSEC) 40 MG capsule Take 1 capsule (40 mg total) by mouth daily. 10/07/14  Yes Evie Lacks Plotnikov, MD  EPINEPHrine 0.3 mg/0.3 mL IJ SOAJ injection Inject 0.3 mLs (0.3 mg total) into the muscle once. 11/24/15 11/24/15  Gareth Morgan, MD  predniSONE (DELTASONE) 10 MG tablet Take 3 tablets (30 mg total) by mouth daily. 11/24/15 11/26/15  Gareth Morgan, MD    Family History Family History  Problem Relation Age of Onset  . Arthritis Mother   . Diabetes Father   . Diabetes Brother   . Coronary artery disease Neg Hx   . Colon cancer Neg Hx     Social History Social History  Substance Use Topics  . Smoking status: Former Smoker    Packs/day: 1.00    Years: 40.00    Types: Cigarettes    Quit date: 01/31/2005  . Smokeless tobacco: Never Used  . Alcohol use 0.0 oz/week     Comment: 2 drinks last 6 months per pt     Allergies   Review of patient's allergies indicates no known allergies.   Review of Systems Review of Systems  Constitutional: Negative for fever.  HENT: Negative for sore throat.   Eyes: Negative for visual disturbance.  Respiratory: Positive for chest tightness and shortness of breath.   Cardiovascular: Negative for chest pain.  Gastrointestinal: Negative for abdominal pain, constipation, diarrhea, nausea and vomiting.  Genitourinary: Negative for difficulty urinating.  Musculoskeletal: Negative for back pain and neck stiffness.  Skin: Positive for Jon [name].  Neurological: Negative for syncope, light-headedness and headaches.     Physical Exam Updated Vital Signs BP 126/85   Pulse 64   Temp 98 F (36.7 C) (Oral)   Resp 14   SpO2 95%   Physical Exam  Constitutional: He is oriented to person, place, and time. He appears well-developed and well-nourished. No distress.  HENT:  Head: Normocephalic and atraumatic.  Mouth/Throat: Oropharynx is clear and moist.  Eyes: Conjunctivae and EOM are normal.  Neck: Normal range of motion.  Cardiovascular: Normal rate, regular rhythm, normal heart sounds and intact distal pulses.  Exam reveals no gallop and no friction rub.   No murmur heard. Pulmonary/Chest: Effort normal and breath sounds normal. No stridor. No respiratory distress. He has no wheezes. He has no rales. He exhibits no tenderness.  Abdominal: Soft. He exhibits no distension. There is no  tenderness. There is no guarding.  Musculoskeletal: He exhibits no edema.  Neurological: He is alert and oriented to person, place, and time.  Skin: Skin is warm and dry. Jon [name] (urticaria over arms, abdomen) noted. He is not diaphoretic.  Nursing Jon [name] and vitals reviewed.    ED Treatments / Results  Labs (all labs ordered are listed, but only abnormal results are displayed) Labs Reviewed  CBC WITH DIFFERENTIAL/PLATELET - Abnormal; Notable for the following:       Result Value   RBC 6.00 (*)    Hemoglobin 17.4 (*)    All other components within normal limits  COMPREHENSIVE METABOLIC PANEL - Abnormal; Notable for the following:    Glucose, Bld 186 (*)    Creatinine, Ser 1.52 (*)    GFR calc non Af Amer 45 (*)    GFR calc Af Amer 52 (*)    All other components within normal limits  EKG  EKG Interpretation  Date/Time:  Monday November 23 2015 20:35:12 EDT Ventricular Rate:  69 PR Interval:  172 QRS Duration: 98 QT Interval:  388 QTC Calculation: 415 R Axis:   37 Text Interpretation:  Normal sinus rhythm Normal ECG No significant change since last tracing Confirmed by [facility] MD, [place] ([id number]) on 11/23/2015 11:57:25 PM Also confirmed by [facility] MD, Maurizio Geno ([id number]), editor [place] [facility], Leda Gauze [phone])  on 11/24/2015 7:19:14 AM       Radiology Dg Chest 2 View  Result Date: 11/23/2015 CLINICAL DATA:  60-year-old Jon [name] with shortness of breath EXAM: CHEST  2 VIEW COMPARISON:  Chest radiograph dated 11/17/2014 FINDINGS: The heart size and mediastinal contours are within normal limits. Both lungs are clear. The visualized skeletal structures are unremarkable. IMPRESSION: No active cardiopulmonary disease. Electronically Signed   By: Anner Crete M.D.   On: 11/23/2015 22:41    Procedures Procedures (including critical care time)  Medications Ordered in ED Medications  diphenhydrAMINE (BENADRYL) injection 50 mg (50 mg Intravenous Given 11/23/15 2120)  famotidine (PEPCID) IVPB  20 mg premix (0 mg Intravenous Stopped 11/24/15 0100)  methylPREDNISolone sodium succinate (SOLU-MEDROL) 125 mg/2 mL injection 125 mg (125 mg Intravenous Given 11/23/15 2120)  EPINEPHrine (EPI-PEN) 0.3 mg/0.3 mL injection (0.3 mg  Given 11/23/15 2122)     Initial Impression / Assessment and Plan / ED Course  I have reviewed the triage vital signs and the nursing notes.  Pertinent labs & imaging results that were available during my care of the patient were reviewed by me and considered in my medical decision making (see chart for details).  Clinical Course   70yo Jon [name] with history of gastroesophageal cancer in remission, hyperlipidemia, atrial fibrillation, DM presents with sudden onset Jon [name], sensation of tightness in upper chest. Initial blood pressures 0000000 systolic in triage, with baseline BP in 120s per family and most recent visit.  Given concern for urticaria, hypotension, sensation of tightness/dyspnea, concern for anaphylaxis and patient given .3mg  IM epinephrine, 50mg  benadryl, 125mg  solumedrol, famotidine IV.  Following this, patient with improvement of symptoms. EKG without acute changes, doubt other etiology of chest tightness/dyspnea given clinical findings.  Jon [name] and sensation in chest resolved. Blood pressures improved.  Clinical signs consistent with anaphylaxis however unclear trigger.  Discussed continued evaluation and avoidance of triggers, when to use epipen.Gave rx for prednisone, epipen, recommended benadryl and zantac.Patient discharged in stable condition with understanding of reasons to return.   Final Clinical Impressions(s) / ED Diagnoses   Final diagnoses:  Anaphylaxis, initial encounter    New Prescriptions Discharge Medication List as of 11/24/2015  1:56 AM    START taking these medications   Details  EPINEPHrine 0.3 mg/0.3 mL IJ SOAJ injection Inject 0.3 mLs (0.3 mg total) into the muscle once., Starting Tue 11/24/2015, Print    predniSONE (DELTASONE) 10 MG  tablet Take 3 tablets (30 mg total) by mouth daily., Starting Tue 11/24/2015, Until Thu 11/26/2015, Print         Gareth Morgan, MD 11/24/15 2116

## 2015-12-15 ENCOUNTER — Telehealth: Payer: Self-pay | Admitting: *Deleted

## 2015-12-15 ENCOUNTER — Other Ambulatory Visit (INDEPENDENT_AMBULATORY_CARE_PROVIDER_SITE_OTHER): Payer: Medicare Other

## 2015-12-15 DIAGNOSIS — R7989 Other specified abnormal findings of blood chemistry: Secondary | ICD-10-CM | POA: Diagnosis not present

## 2015-12-15 DIAGNOSIS — Z0000 Encounter for general adult medical examination without abnormal findings: Secondary | ICD-10-CM | POA: Diagnosis not present

## 2015-12-15 LAB — LIPID PANEL
CHOL/HDL RATIO: 4
Cholesterol: 159 mg/dL (ref 0–200)
HDL: 37 mg/dL — AB (ref >39.00)
NonHDL: 122.24
Triglycerides: 222 mg/dL — ABNORMAL HIGH (ref 0.0–149.0)
VLDL: 44.4 mg/dL — AB (ref 0.0–40.0)

## 2015-12-15 LAB — CBC WITH DIFFERENTIAL/PLATELET
BASOS ABS: 0 10*3/uL (ref 0.0–0.1)
Basophils Relative: 0.5 % (ref 0.0–3.0)
EOS PCT: 4.2 % (ref 0.0–5.0)
Eosinophils Absolute: 0.3 10*3/uL (ref 0.0–0.7)
HEMATOCRIT: 45.8 % (ref 39.0–52.0)
HEMOGLOBIN: 15.4 g/dL (ref 13.0–17.0)
LYMPHS PCT: 35.7 % (ref 12.0–46.0)
Lymphs Abs: 2.5 10*3/uL (ref 0.7–4.0)
MCHC: 33.5 g/dL (ref 30.0–36.0)
MCV: 84.8 fl (ref 78.0–100.0)
MONOS PCT: 13.8 % — AB (ref 3.0–12.0)
Monocytes Absolute: 1 10*3/uL (ref 0.1–1.0)
NEUTROS PCT: 45.8 % (ref 43.0–77.0)
Neutro Abs: 3.2 10*3/uL (ref 1.4–7.7)
Platelets: 329 10*3/uL (ref 150.0–400.0)
RBC: 5.41 Mil/uL (ref 4.22–5.81)
RDW: 15.1 % (ref 11.5–15.5)
WBC: 7 10*3/uL (ref 4.0–10.5)

## 2015-12-15 LAB — URINALYSIS, ROUTINE W REFLEX MICROSCOPIC
BILIRUBIN URINE: NEGATIVE
Hgb urine dipstick: NEGATIVE
KETONES UR: NEGATIVE
LEUKOCYTES UA: NEGATIVE
NITRITE: NEGATIVE
PH: 7 (ref 5.0–8.0)
RBC / HPF: NONE SEEN (ref 0–?)
Specific Gravity, Urine: 1.015 (ref 1.000–1.030)
TOTAL PROTEIN, URINE-UPE24: NEGATIVE
UROBILINOGEN UA: 1 (ref 0.0–1.0)
Urine Glucose: NEGATIVE

## 2015-12-15 LAB — BASIC METABOLIC PANEL
BUN: 19 mg/dL (ref 6–23)
CALCIUM: 10.2 mg/dL (ref 8.4–10.5)
CO2: 29 mEq/L (ref 19–32)
Chloride: 100 mEq/L (ref 96–112)
Creatinine, Ser: 1.34 mg/dL (ref 0.40–1.50)
GFR: 67.67 mL/min (ref >60.00)
GLUCOSE: 129 mg/dL — AB (ref 70–99)
Potassium: 4.5 mEq/L (ref 3.5–5.1)
SODIUM: 139 meq/L (ref 135–145)

## 2015-12-15 LAB — HEMOGLOBIN A1C: Hgb A1c MFr Bld: 7.7 % — ABNORMAL HIGH (ref 4.6–6.5)

## 2015-12-15 LAB — HEPATIC FUNCTION PANEL
ALBUMIN: 4.7 g/dL (ref 3.5–5.2)
ALK PHOS: 63 U/L (ref 39–117)
ALT: 27 U/L (ref 0–53)
AST: 22 U/L (ref 0–37)
BILIRUBIN TOTAL: 0.4 mg/dL (ref 0.2–1.2)
Bilirubin, Direct: 0.1 mg/dL (ref 0.0–0.3)
Total Protein: 7 g/dL (ref 6.0–8.3)

## 2015-12-15 LAB — TSH: TSH: 0.55 u[IU]/mL (ref 0.35–4.50)

## 2015-12-15 LAB — PSA: PSA: 1.43 ng/mL (ref 0.10–4.00)

## 2015-12-15 LAB — LDL CHOLESTEROL, DIRECT: Direct LDL: 52 mg/dL

## 2015-12-15 NOTE — Telephone Encounter (Signed)
Pt walked in to office requesting CPE labs. Labs entered. Pt informed by front desk staff.

## 2015-12-17 ENCOUNTER — Other Ambulatory Visit: Payer: Self-pay | Admitting: *Deleted

## 2015-12-17 MED ORDER — OMEPRAZOLE 40 MG PO CPDR: 40.0000 mg | DELAYED_RELEASE_CAPSULE | Freq: Every day | ORAL | 0 refills | Status: DC

## 2015-12-18 ENCOUNTER — Telehealth: Payer: Self-pay | Admitting: Emergency Medicine

## 2015-12-18 MED ORDER — DILTIAZEM HCL ER COATED BEADS 240 MG PO CP24: 240.0000 mg | ORAL_CAPSULE | Freq: Every day | ORAL | 0 refills | Status: DC

## 2015-12-18 NOTE — Telephone Encounter (Signed)
Pt called and needs prescription refills on his Cartia and omeprazole (PRILOSEC) 40 MG capsule. Pharmacy is [facility]. Please advise thanks.

## 2015-12-18 NOTE — Telephone Encounter (Signed)
Omeprazole was sent on 12/17/15. Cardizem sent today. See meds.

## 2015-12-21 ENCOUNTER — Encounter: Payer: Self-pay | Admitting: Internal Medicine

## 2015-12-21 ENCOUNTER — Encounter: Payer: Self-pay | Admitting: Pulmonary Disease

## 2015-12-21 ENCOUNTER — Ambulatory Visit (INDEPENDENT_AMBULATORY_CARE_PROVIDER_SITE_OTHER): Payer: Medicare Other | Admitting: Internal Medicine

## 2015-12-21 VITALS — BP 100/68 | HR 56 | Wt 222.0 lb

## 2015-12-21 DIAGNOSIS — E119 Type 2 diabetes mellitus without complications: Secondary | ICD-10-CM

## 2015-12-21 DIAGNOSIS — D751 Secondary polycythemia: Secondary | ICD-10-CM

## 2015-12-21 DIAGNOSIS — Z0000 Encounter for general adult medical examination without abnormal findings: Secondary | ICD-10-CM | POA: Diagnosis not present

## 2015-12-21 MED ORDER — METFORMIN HCL 1000 MG PO TABS: 1000.0000 mg | ORAL_TABLET | Freq: Two times a day (BID) | ORAL | 3 refills | Status: DC

## 2015-12-21 NOTE — Assessment & Plan Note (Addendum)
Poss error Repeat labs in 3-4 months Denies OSA

## 2015-12-21 NOTE — Assessment & Plan Note (Signed)
We discussed age appropriate health related issues, including available/recomended screening tests and vaccinations. We discussed a need for adhering to healthy diet and exercise. Labs were reviewed . All questions were answered. 

## 2015-12-21 NOTE — Progress Notes (Signed)
Subjective:  Patient ID: Jon [name], male    DOB: December 13, 1945  Age: 70 y.o. MRN: [id number]  CC: No chief complaint on file.   HPI Jon [name] presents for a well exam.  Outpatient Medications Prior to Visit  Medication Sig Dispense Refill  . aspirin 325 MG EC tablet Take 325 mg by mouth daily.    . Cholecalciferol 1000 UNITS tablet Take 1,000 Units by mouth daily.     Marland Kitchen diltiazem (CARDIZEM CD) 240 MG 24 hr capsule Take 1 capsule (240 mg total) by mouth daily. 90 capsule 0  . fenofibrate micronized (LOFIBRA) 134 MG capsule Take 1 capsule (134 mg total) by mouth daily before breakfast. 30 capsule 11  . metFORMIN (GLUCOPHAGE) 500 MG tablet Take 1 tablet (500 mg total) by mouth 3 (three) times daily. Must keep Nov 20th appt for future refills 90 tablet 1  . Multiple Vitamin (MULTIVITAMIN WITH MINERALS) TABS tablet Take 1 tablet by mouth daily.    Marland Kitchen omeprazole (PRILOSEC) 40 MG capsule Take 1 capsule (40 mg total) by mouth daily. Keep physical appt for future refills 30 capsule 0   No facility-administered medications prior to visit.     ROS Review of Systems  Constitutional: Negative for appetite change, fatigue and unexpected weight change.  HENT: Negative for congestion, nosebleeds, sneezing, sore throat and trouble swallowing.   Eyes: Negative for itching and visual disturbance.  Respiratory: Negative for cough.   Cardiovascular: Negative for chest pain, palpitations and leg swelling.  Gastrointestinal: Negative for abdominal distention, blood in stool, diarrhea and nausea.  Genitourinary: Negative for frequency and hematuria.  Musculoskeletal: Negative for back pain, gait problem, joint swelling and neck pain.  Skin: Negative for rash.  Neurological: Negative for dizziness, tremors, speech difficulty and weakness.  Psychiatric/Behavioral: Negative for agitation, dysphoric mood and sleep disturbance. The patient is not nervous/anxious.     Objective:  BP 100/68    Pulse (!) 56   Wt 222 lb (100.7 kg)   SpO2 93%   BMI 30.96 kg/m   BP Readings from Last 3 Encounters:  12/21/15 100/68  11/24/15 126/85  11/05/15 110/72    Wt Readings from Last 3 Encounters:  12/21/15 222 lb (100.7 kg)  11/05/15 221 lb 12.8 oz (100.6 kg)  10/21/15 216 lb 12.8 oz (98.3 kg)    Physical Exam  Constitutional: He is oriented to person, place, and time. He appears well-developed. No distress.  NAD  HENT:  Mouth/Throat: Oropharynx is clear and moist.  Eyes: Conjunctivae are normal. Pupils are equal, round, and reactive to light.  Neck: Normal range of motion. No JVD present. No thyromegaly present.  Cardiovascular: Normal rate, regular rhythm, normal heart sounds and intact distal pulses.  Exam reveals no gallop and no friction rub.   No murmur heard. Pulmonary/Chest: Effort normal and breath sounds normal. No respiratory distress. He has no wheezes. He has no rales. He exhibits no tenderness.  Abdominal: Soft. Bowel sounds are normal. He exhibits no distension and no mass. There is no tenderness. There is no rebound and no guarding.  Genitourinary: Prostate normal. Rectal exam shows guaiac negative stool.  Musculoskeletal: Normal range of motion. He exhibits no edema or tenderness.  Lymphadenopathy:    He has no cervical adenopathy.  Neurological: He is alert and oriented to person, place, and time. He has normal reflexes. No cranial nerve deficit. He exhibits normal muscle tone. He displays a negative Romberg sign. Coordination and gait normal.  Skin: Skin is  warm and dry. No rash noted.  Psychiatric: He has a normal mood and affect. His behavior is normal. Judgment and thought content normal.    Lab Results  Component Value Date   WBC 7.0 12/15/2015   HGB 15.4 12/15/2015   HCT 45.8 12/15/2015   PLT 329.0 12/15/2015   GLUCOSE 129 (H) 12/15/2015   CHOL 159 12/15/2015   TRIG 222.0 (H) 12/15/2015   HDL 37.00 (L) 12/15/2015   LDLDIRECT 52.0 12/15/2015    LDLCALC 75 12/19/2014   ALT 27 12/15/2015   AST 22 12/15/2015   NA 139 12/15/2015   K 4.5 12/15/2015   CL 100 12/15/2015   CREATININE 1.34 12/15/2015   BUN 19 12/15/2015   CO2 29 12/15/2015   TSH 0.55 12/15/2015   PSA 1.43 12/15/2015   HGBA1C 7.7 (H) 12/15/2015    Dg Chest 2 View  Result Date: 11/23/2015 CLINICAL DATA:  58-year-old male with shortness of breath EXAM: CHEST  2 VIEW COMPARISON:  Chest radiograph dated 11/17/2014 FINDINGS: The heart size and mediastinal contours are within normal limits. Both lungs are clear. The visualized skeletal structures are unremarkable. IMPRESSION: No active cardiopulmonary disease. Electronically Signed   By: Anner Crete M.D.   On: 11/23/2015 22:41    Assessment & Plan:   There are no diagnoses linked to this encounter. I am having Jon [name] maintain his Cholecalciferol, aspirin, fenofibrate micronized, metFORMIN, multivitamin with minerals, omeprazole, and diltiazem.  No orders of the defined types were placed in this encounter.    Follow-up: No Follow-up on file.  Jon Kehr, MD

## 2015-12-21 NOTE — Assessment & Plan Note (Signed)
Increase Metformin to 1000 mg bid (not at goal)

## 2015-12-21 NOTE — Progress Notes (Signed)
Pre visit review using our clinic review tool, if applicable. No additional management support is needed unless otherwise documented below in the visit note. 

## 2015-12-21 NOTE — Patient Instructions (Signed)

## 2015-12-28 ENCOUNTER — Telehealth: Payer: Self-pay

## 2015-12-28 MED ORDER — OMEPRAZOLE 40 MG PO CPDR: 40.0000 mg | DELAYED_RELEASE_CAPSULE | Freq: Every day | ORAL | 1 refills | Status: DC

## 2015-12-28 NOTE — Telephone Encounter (Signed)
[facility] rq rf for omeprazole capsules. erx sent

## 2016-01-04 ENCOUNTER — Other Ambulatory Visit: Payer: Self-pay | Admitting: *Deleted

## 2016-01-04 MED ORDER — DILTIAZEM HCL ER COATED BEADS 240 MG PO CP24: 240.0000 mg | ORAL_CAPSULE | Freq: Every day | ORAL | 3 refills | Status: DC

## 2016-01-14 ENCOUNTER — Other Ambulatory Visit: Payer: Self-pay | Admitting: *Deleted

## 2016-01-14 MED ORDER — METFORMIN HCL 1000 MG PO TABS: 1000.0000 mg | ORAL_TABLET | Freq: Two times a day (BID) | ORAL | 3 refills | Status: DC

## 2016-04-11 ENCOUNTER — Ambulatory Visit: Payer: Medicare Other | Admitting: Internal Medicine

## 2016-04-12 ENCOUNTER — Other Ambulatory Visit: Payer: Self-pay | Admitting: *Deleted

## 2016-04-12 MED ORDER — FENOFIBRATE MICRONIZED 134 MG PO CAPS: 134.0000 mg | ORAL_CAPSULE | Freq: Every day | ORAL | 5 refills | Status: DC

## 2016-04-13 ENCOUNTER — Other Ambulatory Visit (INDEPENDENT_AMBULATORY_CARE_PROVIDER_SITE_OTHER): Payer: Medicare Other

## 2016-04-13 ENCOUNTER — Ambulatory Visit (INDEPENDENT_AMBULATORY_CARE_PROVIDER_SITE_OTHER): Payer: Medicare Other | Admitting: Internal Medicine

## 2016-04-13 ENCOUNTER — Encounter: Payer: Self-pay | Admitting: Internal Medicine

## 2016-04-13 DIAGNOSIS — I10 Essential (primary) hypertension: Secondary | ICD-10-CM | POA: Diagnosis not present

## 2016-04-13 DIAGNOSIS — D751 Secondary polycythemia: Secondary | ICD-10-CM | POA: Diagnosis not present

## 2016-04-13 DIAGNOSIS — C160 Malignant neoplasm of cardia: Secondary | ICD-10-CM | POA: Diagnosis not present

## 2016-04-13 DIAGNOSIS — E119 Type 2 diabetes mellitus without complications: Secondary | ICD-10-CM | POA: Diagnosis not present

## 2016-04-13 DIAGNOSIS — G4733 Obstructive sleep apnea (adult) (pediatric): Secondary | ICD-10-CM

## 2016-04-13 DIAGNOSIS — N528 Other male erectile dysfunction: Secondary | ICD-10-CM

## 2016-04-13 LAB — CBC WITH DIFFERENTIAL/PLATELET
BASOS ABS: 0.1 10*3/uL (ref 0.0–0.1)
Basophils Relative: 0.8 % (ref 0.0–3.0)
Eosinophils Absolute: 0.3 10*3/uL (ref 0.0–0.7)
Eosinophils Relative: 3.9 % (ref 0.0–5.0)
HCT: 45.8 % (ref 39.0–52.0)
Hemoglobin: 15.6 g/dL (ref 13.0–17.0)
LYMPHS ABS: 2.3 10*3/uL (ref 0.7–4.0)
Lymphocytes Relative: 34.8 % (ref 12.0–46.0)
MCHC: 34 g/dL (ref 30.0–36.0)
MCV: 84.3 fl (ref 78.0–100.0)
MONOS PCT: 13.7 % — AB (ref 3.0–12.0)
Monocytes Absolute: 0.9 10*3/uL (ref 0.1–1.0)
NEUTROS PCT: 46.8 % (ref 43.0–77.0)
Neutro Abs: 3.1 10*3/uL (ref 1.4–7.7)
Platelets: 363 10*3/uL (ref 150.0–400.0)
RBC: 5.43 Mil/uL (ref 4.22–5.81)
RDW: 14.5 % (ref 11.5–15.5)
WBC: 6.7 10*3/uL (ref 4.0–10.5)

## 2016-04-13 LAB — BASIC METABOLIC PANEL
BUN: 18 mg/dL (ref 6–23)
CHLORIDE: 99 meq/L (ref 96–112)
CO2: 29 meq/L (ref 19–32)
CREATININE: 1.24 mg/dL (ref 0.40–1.50)
Calcium: 10.4 mg/dL (ref 8.4–10.5)
GFR: 73.94 mL/min (ref >60.00)
Glucose, Bld: 147 mg/dL — ABNORMAL HIGH (ref 70–99)
POTASSIUM: 4.4 meq/L (ref 3.5–5.1)
SODIUM: 136 meq/L (ref 135–145)

## 2016-04-13 LAB — HEMOGLOBIN A1C: Hgb A1c MFr Bld: 8.1 % — ABNORMAL HIGH (ref 4.6–6.5)

## 2016-04-13 NOTE — Progress Notes (Signed)
Subjective:  Patient ID: Jon [name], male    DOB: 23-Aug-1945  Age: 71 y.o. MRN: [id number]  CC: Follow-up (DM type 2 )   HPI IZIC STFORT presents for elevated Hgb, elev glu, HTN f/u  Outpatient Medications Prior to Visit  Medication Sig Dispense Refill  . aspirin 325 MG EC tablet Take 325 mg by mouth daily.    . Cholecalciferol 1000 UNITS tablet Take 1,000 Units by mouth daily.     Marland Kitchen diltiazem (CARDIZEM CD) 240 MG 24 hr capsule Take 1 capsule (240 mg total) by mouth daily. 90 capsule 3  . fenofibrate micronized (LOFIBRA) 134 MG capsule Take 1 capsule (134 mg total) by mouth daily before breakfast. 30 capsule 5  . metFORMIN (GLUCOPHAGE) 1000 MG tablet Take 1 tablet (1,000 mg total) by mouth 2 (two) times daily with a meal. 180 tablet 3  . Multiple Vitamin (MULTIVITAMIN WITH MINERALS) TABS tablet Take 1 tablet by mouth daily.    Marland Kitchen omeprazole (PRILOSEC) 40 MG capsule Take 1 capsule (40 mg total) by mouth daily. Keep physical appt for future refills 90 capsule 1   No facility-administered medications prior to visit.     ROS Review of Systems  Constitutional: Negative for appetite change, fatigue and unexpected weight change.  HENT: Negative for congestion, nosebleeds, sneezing, sore throat and trouble swallowing.   Eyes: Negative for itching and visual disturbance.  Respiratory: Negative for cough.   Cardiovascular: Negative for chest pain, palpitations and leg swelling.  Gastrointestinal: Negative for abdominal distention, blood in stool, diarrhea and nausea.  Genitourinary: Negative for frequency and hematuria.  Musculoskeletal: Negative for back pain, gait problem, joint swelling and neck pain.  Skin: Negative for rash.  Neurological: Negative for dizziness, tremors, speech difficulty and weakness.  Psychiatric/Behavioral: Negative for agitation, dysphoric mood and sleep disturbance. The patient is not nervous/anxious.     Objective:  BP 118/72   Pulse 61   Temp  98.4 F (36.9 C) (Oral)   Resp 16   Ht 5\' 11"  (1.803 m)   Wt 225 lb 15 oz (102.5 kg)   SpO2 94%   BMI 31.51 kg/m   BP Readings from Last 3 Encounters:  04/13/16 118/72  12/21/15 100/68  11/24/15 126/85    Wt Readings from Last 3 Encounters:  04/13/16 225 lb 15 oz (102.5 kg)  12/21/15 222 lb (100.7 kg)  11/05/15 221 lb 12.8 oz (100.6 kg)    Physical Exam  Constitutional: He is oriented to person, place, and time. He appears well-developed. No distress.  NAD  HENT:  Mouth/Throat: Oropharynx is clear and moist.  Eyes: Conjunctivae are normal. Pupils are equal, round, and reactive to light.  Neck: Normal range of motion. No JVD present. No thyromegaly present.  Cardiovascular: Normal rate, regular rhythm, normal heart sounds and intact distal pulses.  Exam reveals no gallop and no friction rub.   No murmur heard. Pulmonary/Chest: Effort normal and breath sounds normal. No respiratory distress. He has no wheezes. He has no rales. He exhibits no tenderness.  Abdominal: Soft. Bowel sounds are normal. He exhibits no distension and no mass. There is no tenderness. There is no rebound and no guarding.  Musculoskeletal: Normal range of motion. He exhibits no edema or tenderness.  Lymphadenopathy:    He has no cervical adenopathy.  Neurological: He is alert and oriented to person, place, and time. He has normal reflexes. No cranial nerve deficit. He exhibits normal muscle tone. He displays a negative Romberg sign.  Coordination and gait normal.  Skin: Skin is warm and dry. No rash noted.  Psychiatric: He has a normal mood and affect. His behavior is normal. Judgment and thought content normal.    Lab Results  Component Value Date   WBC 7.0 12/15/2015   HGB 15.4 12/15/2015   HCT 45.8 12/15/2015   PLT 329.0 12/15/2015   GLUCOSE 129 (H) 12/15/2015   CHOL 159 12/15/2015   TRIG 222.0 (H) 12/15/2015   HDL 37.00 (L) 12/15/2015   LDLDIRECT 52.0 12/15/2015   LDLCALC 75 12/19/2014   ALT  27 12/15/2015   AST 22 12/15/2015   NA 139 12/15/2015   K 4.5 12/15/2015   CL 100 12/15/2015   CREATININE 1.34 12/15/2015   BUN 19 12/15/2015   CO2 29 12/15/2015   TSH 0.55 12/15/2015   PSA 1.43 12/15/2015   HGBA1C 7.7 (H) 12/15/2015    Dg Chest 2 View  Result Date: 11/23/2015 CLINICAL DATA:  17-year-old male with shortness of breath EXAM: CHEST  2 VIEW COMPARISON:  Chest radiograph dated 11/17/2014 FINDINGS: The heart size and mediastinal contours are within normal limits. Both lungs are clear. The visualized skeletal structures are unremarkable. IMPRESSION: No active cardiopulmonary disease. Electronically Signed   By: Anner Crete M.D.   On: 11/23/2015 22:41    Assessment & Plan:   There are no diagnoses linked to this encounter. I am having Mr. Cloninger maintain his Cholecalciferol, aspirin, multivitamin with minerals, omeprazole, diltiazem, metFORMIN, and fenofibrate micronized.  No orders of the defined types were placed in this encounter.    Follow-up: No Follow-up on file.  Walker Kehr, MD

## 2016-04-13 NOTE — Assessment & Plan Note (Signed)
He is getting a new CPAP mask/machine CBC

## 2016-04-13 NOTE — Progress Notes (Signed)
Pre-visit discussion using our clinic review tool. No additional management support is needed unless otherwise documented below in the visit note.  

## 2016-04-13 NOTE — Assessment & Plan Note (Signed)
Labs

## 2016-04-13 NOTE — Assessment & Plan Note (Signed)
LABS

## 2016-04-13 NOTE — Assessment & Plan Note (Signed)
On CPAP. ?

## 2016-04-13 NOTE — Assessment & Plan Note (Signed)
Trial of Levitra

## 2016-04-14 ENCOUNTER — Other Ambulatory Visit: Payer: Self-pay | Admitting: Internal Medicine

## 2016-04-14 MED ORDER — REPAGLINIDE 1 MG PO TABS: 1.0000 mg | ORAL_TABLET | Freq: Three times a day (TID) | ORAL | 11 refills | Status: DC

## 2016-04-19 DIAGNOSIS — G4733 Obstructive sleep apnea (adult) (pediatric): Secondary | ICD-10-CM | POA: Diagnosis not present

## 2016-04-22 ENCOUNTER — Telehealth: Payer: Self-pay

## 2016-04-22 MED ORDER — NATEGLINIDE 120 MG PO TABS: 120.0000 mg | ORAL_TABLET | Freq: Three times a day (TID) | ORAL | 11 refills | Status: DC

## 2016-04-22 NOTE — Telephone Encounter (Signed)
PA started via covermymeds. Key: [id number]  Nateglinide is the alternative preferred.

## 2016-04-22 NOTE — Telephone Encounter (Signed)
OK Nateglinide Thx

## 2016-04-22 NOTE — Telephone Encounter (Signed)
What strength?

## 2016-04-22 NOTE — Telephone Encounter (Signed)
LVM for pt to call back as soon as possible.   RE: change in rx sent to pharmacy due to PA denial.

## 2016-04-22 NOTE — Telephone Encounter (Signed)
PA was denied. Nateglinide is the alternative preferred. Please advise

## 2016-04-22 NOTE — Telephone Encounter (Signed)
Rx emailed Thx 

## 2016-04-26 ENCOUNTER — Telehealth: Payer: Self-pay

## 2016-04-26 NOTE — Telephone Encounter (Signed)
Rx request for Repaglinide 1 mg tablet Last refilled 04/14/2016, D/C 04/22/2016 Last ov 04/13/2016 Next ov 12/20/2016 Please advise

## 2016-04-26 NOTE — Telephone Encounter (Signed)
PA is needed  Disregard request..Marland KitchenMarland Kitchen

## 2016-04-28 DIAGNOSIS — H40023 Open angle with borderline findings, high risk, bilateral: Secondary | ICD-10-CM | POA: Diagnosis not present

## 2016-04-28 DIAGNOSIS — Z961 Presence of intraocular lens: Secondary | ICD-10-CM | POA: Diagnosis not present

## 2016-04-28 DIAGNOSIS — E119 Type 2 diabetes mellitus without complications: Secondary | ICD-10-CM | POA: Diagnosis not present

## 2016-04-28 DIAGNOSIS — Q141 Congenital malformation of retina: Secondary | ICD-10-CM | POA: Diagnosis not present

## 2016-04-28 LAB — HM DIABETES EYE EXAM

## 2016-05-03 ENCOUNTER — Encounter: Payer: Self-pay | Admitting: Internal Medicine

## 2016-05-12 ENCOUNTER — Telehealth: Payer: Self-pay

## 2016-05-12 MED ORDER — FENOFIBRATE MICRONIZED 134 MG PO CAPS: 134.0000 mg | ORAL_CAPSULE | Freq: Every day | ORAL | 1 refills | Status: DC

## 2016-05-12 NOTE — Telephone Encounter (Signed)
Rec'd fax from [facility] for Fenofibrate 34mg  #90days

## 2016-06-20 ENCOUNTER — Ambulatory Visit (HOSPITAL_BASED_OUTPATIENT_CLINIC_OR_DEPARTMENT_OTHER): Payer: Medicare Other | Admitting: Oncology

## 2016-06-20 ENCOUNTER — Telehealth: Payer: Self-pay | Admitting: Oncology

## 2016-06-20 ENCOUNTER — Other Ambulatory Visit (HOSPITAL_BASED_OUTPATIENT_CLINIC_OR_DEPARTMENT_OTHER): Payer: Medicare Other

## 2016-06-20 VITALS — BP 136/80 | HR 65 | Temp 98.7°F | Resp 18 | Ht 71.0 in | Wt 225.0 lb

## 2016-06-20 DIAGNOSIS — C160 Malignant neoplasm of cardia: Secondary | ICD-10-CM

## 2016-06-20 DIAGNOSIS — D225 Melanocytic nevi of trunk: Secondary | ICD-10-CM | POA: Diagnosis not present

## 2016-06-20 DIAGNOSIS — E785 Hyperlipidemia, unspecified: Secondary | ICD-10-CM

## 2016-06-20 DIAGNOSIS — Z8509 Personal history of malignant neoplasm of other digestive organs: Secondary | ICD-10-CM

## 2016-06-20 DIAGNOSIS — C169 Malignant neoplasm of stomach, unspecified: Secondary | ICD-10-CM

## 2016-06-20 DIAGNOSIS — E119 Type 2 diabetes mellitus without complications: Secondary | ICD-10-CM | POA: Diagnosis not present

## 2016-06-20 DIAGNOSIS — D649 Anemia, unspecified: Secondary | ICD-10-CM | POA: Diagnosis not present

## 2016-06-20 LAB — CEA (IN HOUSE-CHCC): CEA (CHCC-In House): 1.3 ng/mL (ref 0.00–5.00)

## 2016-06-20 NOTE — Telephone Encounter (Signed)
Gave patient AVS and calender per 5/21 los.  

## 2016-06-20 NOTE — Progress Notes (Signed)
[place] OFFICE PROGRESS NOTE   Diagnosis: Gastroesophageal carcinoma  INTERVAL HISTORY:   Jon [name] returns as scheduled. He feels well. No dysphagia. Good appetite and energy level. He was seen in the emergency room with an anaphylactic reaction in October 2017. No trigger agent was identified.  Objective:  Vital signs in last 24 hours:  Blood pressure 136/80, pulse 65, temperature 98.7 F (37.1 C), temperature source Oral, resp. rate 18, height 5\' 11"  (1.803 m), weight 225 lb (102.1 kg), SpO2 97 %.    HEENT: Neck without mass Lymphatics: No cervical, supraclavicular, axillary, or inguinal nodes Resp: Lungs with inspiratory rhonchi at the left posterior base, no respiratory distress Cardio: Regular rate and rhythm GI: No hepatosplenomegaly, no mass, nontender Vascular: No leg edema Skin: Less than 1 cm hyperpigmented mole at the right upper back   Lab Results:   Lab Results  Component Value Date   CEA1 1.37 10/21/2015   CEA1 3.8 10/21/2015     Medications: I have reviewed the patient's current medications.  Assessment/Plan: 1. 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 Staging CT scan 08/27/2010 revealed small gastrohepatic ligament, periportal and retrocrural lymph nodes without other evidence of metastatic disease. Staging PET scan on 09/21/2010 showed abnormal malignant range FDG uptake at the site of the biopsy-proven gastric cancer. Small perigastric lymph nodes were not FDG avid above the background level. The radiologist commented that this may reflect a benign etiology or a false-negative appearance due to the small lymph node size. There was no evidence for distant metastatic disease. The CEA was markedly elevated at 482 on 09/14/2010. He completed radiation 09/20/2010 through 10/29/2010. He received concurrent weekly Taxol/carboplatin chemotherapy  09/22/2010 through 10/27/2010. Restaging PET scan 11/22/2010 showed interval resolution of previously demonstrated hypermetabolic activity within the proximal stomach. There was no evidence of metastatic disease. Restaging PET scan 05/10/2011 showed no evidence of gastroesophageal carcinoma recurrence or metastasis. 2. Status post upper endoscopy 12/17/2010-the very distal portion of the esophagus was ulcerated. The proximal stomach revealed a malignant appearing ulcerative mass in the region of the cardia/gastroesophageal junction. The overall bulk of the lesion was significantly improved but still quite abnormal and large. Multiple biopsies were taken. Pathology was negative for malignancy. 3. Elevated CEA (482) on 09/14/2010.  4. Dysphagia secondary to the lower esophagus/upper gastric tumor, resolved. 5. Microcytic anemia. The hemoglobin was normal on 03/31/2011. There was persistent red cell microcytosis. Hemoglobin and MCV within normal range on 06/19/2014. 6. Sleep apnea. 7. Early diabetes. 8. History of atrial fibrillation. 9. Hyperlipidemia. 10. History of colon polyps. 11. History of Helicobacter pylori infection in 2011. 12. History of neutropenia secondary to chemotherapy, resolved. 13. CT of the chest on 02/15/2011 with left lower lung density/air bronchograms-? Radiation change. 14. Colonoscopy 03/27/2013. Mild melanosis found throughout the entire examined colon. 2 diminutive polyps found in the descending colon (Tubular adenoma. No high-grade dysplasia or malignancy noted). Moderate diverticulosis in the left colon. Followup colonoscopy in 5 years. 15. Nodular hyperpigmented mole at the right upper back . He reports the mole has been evaluated.   Disposition:  Mr. Maiden remains in clinical remission from gastroesophageal carcinoma. He will return for an office visit and CEA in one year. We will follow-up on the CEA from today.  15 minutes were spent with the patient today.  The majority of the time was used for counseling and coordination of care.  Betsy Coder, MD  06/20/2016  11:33 AM

## 2016-06-21 LAB — CEA: CEA: 4.4 ng/mL (ref 0.0–4.7)

## 2016-06-22 ENCOUNTER — Telehealth: Payer: Self-pay | Admitting: *Deleted

## 2016-06-22 NOTE — Telephone Encounter (Signed)
Patient notified per order of Dr. Benay Spice that cea is normal.  Patient appreciative of call and has no questions at this time.

## 2016-07-21 ENCOUNTER — Other Ambulatory Visit: Payer: Self-pay | Admitting: Internal Medicine

## 2016-10-16 ENCOUNTER — Other Ambulatory Visit: Payer: Self-pay | Admitting: Internal Medicine

## 2016-10-25 ENCOUNTER — Telehealth: Payer: Self-pay | Admitting: Internal Medicine

## 2016-10-25 MED ORDER — OMEPRAZOLE 40 MG PO CPDR: DELAYED_RELEASE_CAPSULE | ORAL | 0 refills | Status: DC

## 2016-10-25 NOTE — Telephone Encounter (Signed)
Pt called for a reflil of his omeprazole (PRILOSEC) 40 MG capsule  Please advise  Last seen 03/2016

## 2016-10-25 NOTE — Telephone Encounter (Signed)
Sent refill to [facility] 

## 2016-10-31 DIAGNOSIS — H40052 Ocular hypertension, left eye: Secondary | ICD-10-CM | POA: Diagnosis not present

## 2016-10-31 DIAGNOSIS — H40023 Open angle with borderline findings, high risk, bilateral: Secondary | ICD-10-CM | POA: Diagnosis not present

## 2016-11-04 ENCOUNTER — Encounter: Payer: Self-pay | Admitting: Adult Health

## 2016-11-04 ENCOUNTER — Ambulatory Visit (INDEPENDENT_AMBULATORY_CARE_PROVIDER_SITE_OTHER): Payer: Medicare Other | Admitting: Adult Health

## 2016-11-04 VITALS — BP 114/74 | HR 60 | Ht 71.0 in | Wt 217.4 lb

## 2016-11-04 DIAGNOSIS — G4733 Obstructive sleep apnea (adult) (pediatric): Secondary | ICD-10-CM

## 2016-11-04 NOTE — Progress Notes (Signed)
@Patient  ID: Jon [name], male    DOB: 04/02/1945, 71 y.o.   MRN: [id number]  Chief Complaint  Patient presents with  . 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 Patient says he is doing very well on C Pap at bedtime. He uses nasal pillows. He says he uses his machine for about 7-8 hours. He feels rested with no significant daytime sleepiness. Patient is machine was unable to be downloaded due to the lack of S, D card. A download from his DME was requested. Patient declines flu shot  No Known Allergies  Immunization History  Administered Date(s) Administered  . Pneumococcal Conjugate-13 12/18/2012  . Pneumococcal Polysaccharide-23 03/15/2011  . Td 03/18/2004, 06/18/2014  . Zoster 12/31/2009    Past Medical History:  Diagnosis Date  . Adenomatous colon polyp   . Anemia   . Atrial fibrillation ([place])   . Cataract   . Diabetes mellitus without complication ([place])   . Diverticulosis   . 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[place]) 2012   Gastroesophageal /radiation Tx and Chemo  . GERD (gastroesophageal reflux disease)   . Glaucoma   . H. pylori infection 2011  . Heart murmur   . History of radiation therapy 09/20/10 thru 10/29/10   gastroesophageal junction/gastric cardia  . Other and unspecified hyperlipidemia   . Sleep apnea   . Status post chemotherapy 09/22/10 thru 10/27/10   concurrent w/radiation  . Unspecified sleep apnea     Tobacco History: History  Smoking Status  . Former Smoker  . Packs/day: 1.00  . Years: 40.00  . Types: Cigarettes  . Quit date: 01/31/2005  Smokeless Tobacco  . Never Used   Counseling given: Not Answered   Outpatient  Encounter Prescriptions as of 11/04/2016  Medication Sig  . aspirin 325 MG EC tablet Take 325 mg by mouth daily.  . Cholecalciferol 1000 UNITS tablet Take 1,000 Units by mouth daily.   Marland Kitchen diltiazem (CARDIZEM CD) 240 MG 24 hr capsule Take 1 capsule (240 mg total) by mouth daily.  . fenofibrate micronized (LOFIBRA) 134 MG capsule Take 1 capsule (134 mg total) by mouth daily before breakfast.  . metFORMIN (GLUCOPHAGE) 1000 MG tablet Take 1 tablet (1,000 mg total) by mouth 2 (two) times daily with a meal.  . Multiple Vitamin (MULTIVITAMIN WITH MINERALS) TABS tablet Take 1 tablet by mouth daily.  . nateglinide (STARLIX) 120 MG tablet Take 1 tablet (120 mg total) by mouth 3 (three) times daily with meals.  Marland Kitchen omeprazole (PRILOSEC) 40 MG capsule Take 1 capsule by mouth daily   No facility-administered encounter medications on file as of 11/04/2016.      Review of Systems  Constitutional:   No  weight loss, night sweats,  Fevers, chills, fatigue, or  lassitude.  HEENT:   No headaches,  Difficulty swallowing,  Tooth/dental problems, or  Sore throat,                No sneezing, itching, ear ache, nasal congestion, post nasal drip,   CV:  No chest pain,  Orthopnea, PND, swelling in lower extremities, anasarca, dizziness, palpitations, syncope.   GI  No heartburn, indigestion, abdominal pain, nausea, vomiting, diarrhea, change  in bowel habits, loss of appetite, bloody stools.   Resp: No shortness of breath with exertion or at rest.  No excess mucus, no productive cough,  No non-productive cough,  No coughing up of blood.  No change in color of mucus.  No wheezing.  No chest wall deformity  Skin: no rash or lesions.  GU: no dysuria, change in color of urine, no urgency or frequency.  No flank pain, no hematuria   MS:  No joint pain or swelling.  No decreased range of motion.  No back pain.    Physical Exam  BP 114/74 (BP Location: Left Arm, Cuff Size: Normal)   Pulse 60   Ht 5\' 11"  (1.803 m)    Wt 217 lb 6.4 oz (98.6 kg)   SpO2 97%   BMI 30.32 kg/m   GEN: A/Ox3; pleasant , NAD, well nourished    HEENT:  [place]/AT,  EACs-clear, TMs-wnl, NOSE-clear, THROAT-clear, no lesions, no postnasal drip or exudate noted. Class 2 MP airway   NECK:  Supple w/ fair ROM; no JVD; normal carotid impulses w/o bruits; no thyromegaly or nodules palpated; no lymphadenopathy.    RESP  Clear  P & A; w/o, wheezes/ rales/ or rhonchi. no accessory muscle use, no dullness to percussion  CARD:  RRR, no m/r/g, no peripheral edema, pulses intact, no cyanosis or clubbing.  GI:   Soft & nt; nml bowel sounds; no organomegaly or masses detected.   Musco: Warm bil, no deformities or joint swelling noted.   Neuro: alert, no focal deficits noted.    Skin: Warm, no lesions or rashes    Lab Results:    BNP No results found for: BNP  ProBNP No results found for: PROBNP  Imaging: No results found.   Assessment & Plan:   Obstructive sleep apnea Controlled on CPAP At bedtime   CPAP download requested.   Plan  Patient Instructions  Keep up the good work. Continue on C Pap at bedtime CPAP download requested.  Do not drive if sleepy Follow up with Dr. Elsworth Soho  In 1 year and As needed          Rexene Edison, NP 11/04/2016

## 2016-11-04 NOTE — Assessment & Plan Note (Signed)
Controlled on CPAP At bedtime   CPAP download requested.   Plan  Patient Instructions  Keep up the good work. Continue on C Pap at bedtime CPAP download requested.  Do not drive if sleepy Follow up with Dr. Elsworth Soho  In 1 year and As needed

## 2016-11-04 NOTE — Patient Instructions (Addendum)
Keep up the good work. Continue on C Pap at bedtime CPAP download requested.  Do not drive if sleepy Follow up with Dr. Elsworth Soho  In 1 year and As needed

## 2016-12-20 ENCOUNTER — Other Ambulatory Visit (INDEPENDENT_AMBULATORY_CARE_PROVIDER_SITE_OTHER): Payer: Medicare Other

## 2016-12-20 ENCOUNTER — Encounter: Payer: Self-pay | Admitting: Internal Medicine

## 2016-12-20 ENCOUNTER — Ambulatory Visit: Payer: Medicare Other | Admitting: Internal Medicine

## 2016-12-20 DIAGNOSIS — D751 Secondary polycythemia: Secondary | ICD-10-CM

## 2016-12-20 DIAGNOSIS — I10 Essential (primary) hypertension: Secondary | ICD-10-CM

## 2016-12-20 DIAGNOSIS — E785 Hyperlipidemia, unspecified: Secondary | ICD-10-CM

## 2016-12-20 DIAGNOSIS — E119 Type 2 diabetes mellitus without complications: Secondary | ICD-10-CM

## 2016-12-20 DIAGNOSIS — C160 Malignant neoplasm of cardia: Secondary | ICD-10-CM | POA: Diagnosis not present

## 2016-12-20 LAB — BASIC METABOLIC PANEL
BUN: 22 mg/dL (ref 6–23)
CO2: 26 mEq/L (ref 19–32)
Calcium: 9.7 mg/dL (ref 8.4–10.5)
Chloride: 100 mEq/L (ref 96–112)
Creatinine, Ser: 1.24 mg/dL (ref 0.40–1.50)
GFR: 73.79 mL/min (ref >60.00)
Glucose, Bld: 174 mg/dL — ABNORMAL HIGH (ref 70–99)
POTASSIUM: 4 meq/L (ref 3.5–5.1)
Sodium: 136 mEq/L (ref 135–145)

## 2016-12-20 LAB — HEMOGLOBIN A1C: HEMOGLOBIN A1C: 7 % — AB (ref 4.6–6.5)

## 2016-12-20 MED ORDER — ZOSTER VAC RECOMB ADJUVANTED 50 MCG/0.5ML IM SUSR: 0.5000 mL | Freq: Once | INTRAMUSCULAR | 1 refills | Status: AC

## 2016-12-20 NOTE — Assessment & Plan Note (Signed)
CBC On CPAP - pillows

## 2016-12-20 NOTE — Assessment & Plan Note (Signed)
Cardizem  

## 2016-12-20 NOTE — Progress Notes (Signed)
   Subjective:  Patient ID: Jon [name], male    DOB: [birth year]  Age: 71 y.o. MRN: [id number]  CC: No chief complaint on file.   HPI Jon [name] presents for DM, HTN, dyslipidemia f/u  Outpatient Medications Prior to Visit  Medication Sig Dispense Refill  . aspirin 325 MG EC tablet Take 325 mg by mouth daily.    . Cholecalciferol 1000 UNITS tablet Take 1,000 Units by mouth daily.     Marland Kitchen diltiazem (CARDIZEM CD) 240 MG 24 hr capsule Take 1 capsule (240 mg total) by mouth daily. 90 capsule 3  . fenofibrate micronized (LOFIBRA) 134 MG capsule Take 1 capsule (134 mg total) by mouth daily before breakfast. 90 capsule 1  . metFORMIN (GLUCOPHAGE) 1000 MG tablet Take 1 tablet (1,000 mg total) by mouth 2 (two) times daily with a meal. 180 tablet 3  . Multiple Vitamin (MULTIVITAMIN WITH MINERALS) TABS tablet Take 1 tablet by mouth daily.    . nateglinide (STARLIX) 120 MG tablet Take 1 tablet (120 mg total) by mouth 3 (three) times daily with meals. 90 tablet 11  . omeprazole (PRILOSEC) 40 MG capsule Take 1 capsule by mouth daily 90 capsule 0   No facility-administered medications prior to visit.     ROS Review of Systems  Objective:  BP 112/68 (BP Location: Left Arm, Patient Position: Sitting, Cuff Size: Large)   Pulse (!) 58   Temp 98.1 F (36.7 C) (Oral)   Ht 5\' 11"  (1.803 m)   Wt 222 lb (100.7 kg)   SpO2 98%   BMI 30.96 kg/m   BP Readings from Last 3 Encounters:  12/20/16 112/68  11/04/16 114/74  06/20/16 136/80    Wt Readings from Last 3 Encounters:  12/20/16 222 lb (100.7 kg)  11/04/16 217 lb 6.4 oz (98.6 kg)  06/20/16 225 lb (102.1 kg)    Physical Exam  Lab Results  Component Value Date   WBC 6.7 04/13/2016   HGB 15.6 04/13/2016   HCT 45.8 04/13/2016   PLT 363.0 04/13/2016   GLUCOSE 147 (H) 04/13/2016   CHOL 159 12/15/2015   TRIG 222.0 (H) 12/15/2015   HDL 37.00 (L) 12/15/2015   LDLDIRECT 52.0 12/15/2015   LDLCALC 75 12/19/2014   ALT 27 12/15/2015    AST 22 12/15/2015   NA 136 04/13/2016   K 4.4 04/13/2016   CL 99 04/13/2016   CREATININE 1.24 04/13/2016   BUN 18 04/13/2016   CO2 29 04/13/2016   TSH 0.55 12/15/2015   PSA 1.43 12/15/2015   HGBA1C 8.1 (H) 04/13/2016    Dg Chest 2 View  Result Date: 11/23/2015 CLINICAL DATA:  59-year-old male with shortness of breath EXAM: CHEST  2 VIEW COMPARISON:  Chest radiograph dated 11/17/2014 FINDINGS: The heart size and mediastinal contours are within normal limits. Both lungs are clear. The visualized skeletal structures are unremarkable. IMPRESSION: No active cardiopulmonary disease. Electronically Signed   By: Anner Crete M.D.   On: 11/23/2015 22:41    Assessment & Plan:   There are no diagnoses linked to this encounter. I am having [name] maintain his Cholecalciferol, aspirin, multivitamin with minerals, diltiazem, metFORMIN, nateglinide, fenofibrate micronized, and omeprazole.  No orders of the defined types were placed in this encounter.    Follow-up: No Follow-up on file.  Walker Kehr, MD

## 2016-12-20 NOTE — Assessment & Plan Note (Addendum)
Lopid Metformin, Starlix Statins discussed - LDL at goal

## 2016-12-20 NOTE — Assessment & Plan Note (Signed)
F/u w/Dr Sherrill 

## 2016-12-20 NOTE — Assessment & Plan Note (Signed)
Metformin, Starlix Statins discussed

## 2017-01-11 ENCOUNTER — Other Ambulatory Visit: Payer: Self-pay | Admitting: Internal Medicine

## 2017-01-11 MED ORDER — METFORMIN HCL 1000 MG PO TABS: 1000.0000 mg | ORAL_TABLET | Freq: Two times a day (BID) | ORAL | 3 refills | Status: DC

## 2017-01-11 NOTE — Telephone Encounter (Signed)
erx sent for 90 day and 3 refills per rf protocol.

## 2017-01-11 NOTE — Telephone Encounter (Signed)
Copied from [place] [id number]. Topic: General - Other >> Jan 11, 2017 10:20 AM Darl Householder, RMA wrote: Reason for CRM: Medication refill request for Metformin 1,000 mg to be sent to [facility]

## 2017-02-03 ENCOUNTER — Telehealth: Payer: Self-pay | Admitting: Internal Medicine

## 2017-02-03 ENCOUNTER — Other Ambulatory Visit: Payer: Self-pay

## 2017-02-03 MED ORDER — OMEPRAZOLE 40 MG PO CPDR: DELAYED_RELEASE_CAPSULE | ORAL | 0 refills | Status: DC

## 2017-02-03 NOTE — Telephone Encounter (Signed)
Copied from [place] [id number]. Topic: Quick Communication - Rx Refill/Question >> Feb 03, 2017  9:57 AM Carolyn Stare wrote: Has the patient contacted their pharmacy  {yes     Rx   omeprazole (PRILOSEC) 40 MG capsule  preferred Pharmacy (with phone number or street name     [facility]  [place]: Please be advised that RX refills may take up to 3 business days. We ask that you follow-up with your pharmacy.

## 2017-02-08 DIAGNOSIS — H40052 Ocular hypertension, left eye: Secondary | ICD-10-CM | POA: Diagnosis not present

## 2017-02-08 DIAGNOSIS — H40051 Ocular hypertension, right eye: Secondary | ICD-10-CM | POA: Diagnosis not present

## 2017-02-08 DIAGNOSIS — H40023 Open angle with borderline findings, high risk, bilateral: Secondary | ICD-10-CM | POA: Diagnosis not present

## 2017-02-16 DIAGNOSIS — H40023 Open angle with borderline findings, high risk, bilateral: Secondary | ICD-10-CM | POA: Diagnosis not present

## 2017-02-16 DIAGNOSIS — H40052 Ocular hypertension, left eye: Secondary | ICD-10-CM | POA: Diagnosis not present

## 2017-03-02 DIAGNOSIS — H40051 Ocular hypertension, right eye: Secondary | ICD-10-CM | POA: Diagnosis not present

## 2017-03-21 ENCOUNTER — Ambulatory Visit: Payer: Medicare Other | Admitting: Internal Medicine

## 2017-03-21 ENCOUNTER — Encounter: Payer: Self-pay | Admitting: Internal Medicine

## 2017-03-21 ENCOUNTER — Other Ambulatory Visit (INDEPENDENT_AMBULATORY_CARE_PROVIDER_SITE_OTHER): Payer: Medicare Other

## 2017-03-21 VITALS — BP 118/72 | HR 60 | Temp 98.8°F | Ht 71.0 in | Wt 224.0 lb

## 2017-03-21 DIAGNOSIS — D751 Secondary polycythemia: Secondary | ICD-10-CM

## 2017-03-21 DIAGNOSIS — E119 Type 2 diabetes mellitus without complications: Secondary | ICD-10-CM | POA: Diagnosis not present

## 2017-03-21 DIAGNOSIS — Z23 Encounter for immunization: Secondary | ICD-10-CM

## 2017-03-21 DIAGNOSIS — I10 Essential (primary) hypertension: Secondary | ICD-10-CM

## 2017-03-21 DIAGNOSIS — K2100 Gastro-esophageal reflux disease with esophagitis, without bleeding: Secondary | ICD-10-CM

## 2017-03-21 DIAGNOSIS — E785 Hyperlipidemia, unspecified: Secondary | ICD-10-CM

## 2017-03-21 DIAGNOSIS — C169 Malignant neoplasm of stomach, unspecified: Secondary | ICD-10-CM

## 2017-03-21 LAB — CBC WITH DIFFERENTIAL/PLATELET
BASOS PCT: 0.9 % (ref 0.0–3.0)
Basophils Absolute: 0.1 10*3/uL (ref 0.0–0.1)
EOS ABS: 0.3 10*3/uL (ref 0.0–0.7)
Eosinophils Relative: 3.9 % (ref 0.0–5.0)
HEMATOCRIT: 43.8 % (ref 39.0–52.0)
HEMOGLOBIN: 14.8 g/dL (ref 13.0–17.0)
LYMPHS PCT: 23.8 % (ref 12.0–46.0)
Lymphs Abs: 1.9 10*3/uL (ref 0.7–4.0)
MCHC: 33.8 g/dL (ref 30.0–36.0)
MCV: 85.4 fl (ref 78.0–100.0)
Monocytes Absolute: 1.1 10*3/uL — ABNORMAL HIGH (ref 0.1–1.0)
Monocytes Relative: 13.9 % — ABNORMAL HIGH (ref 3.0–12.0)
NEUTROS ABS: 4.6 10*3/uL (ref 1.4–7.7)
Neutrophils Relative %: 57.5 % (ref 43.0–77.0)
PLATELETS: 398 10*3/uL (ref 150.0–400.0)
RBC: 5.13 Mil/uL (ref 4.22–5.81)
RDW: 15.2 % (ref 11.5–15.5)
WBC: 8 10*3/uL (ref 4.0–10.5)

## 2017-03-21 LAB — BASIC METABOLIC PANEL
BUN: 20 mg/dL (ref 6–23)
CHLORIDE: 99 meq/L (ref 96–112)
CO2: 28 meq/L (ref 19–32)
Calcium: 10.1 mg/dL (ref 8.4–10.5)
Creatinine, Ser: 1.28 mg/dL (ref 0.40–1.50)
GFR: 71.09 mL/min (ref >60.00)
Glucose, Bld: 157 mg/dL — ABNORMAL HIGH (ref 70–99)
POTASSIUM: 4.4 meq/L (ref 3.5–5.1)
Sodium: 135 mEq/L (ref 135–145)

## 2017-03-21 LAB — HEPATIC FUNCTION PANEL
ALBUMIN: 4.2 g/dL (ref 3.5–5.2)
ALT: 20 U/L (ref 0–53)
AST: 20 U/L (ref 0–37)
Alkaline Phosphatase: 47 U/L (ref 39–117)
BILIRUBIN TOTAL: 0.4 mg/dL (ref 0.2–1.2)
Bilirubin, Direct: 0.1 mg/dL (ref 0.0–0.3)
Total Protein: 7 g/dL (ref 6.0–8.3)

## 2017-03-21 LAB — HEMOGLOBIN A1C: HEMOGLOBIN A1C: 7.1 % — AB (ref 4.6–6.5)

## 2017-03-21 LAB — TSH: TSH: 0.44 u[IU]/mL (ref 0.35–4.50)

## 2017-03-21 MED ORDER — NATEGLINIDE 120 MG PO TABS: 120.0000 mg | ORAL_TABLET | Freq: Three times a day (TID) | ORAL | 3 refills | Status: DC

## 2017-03-21 MED ORDER — DILTIAZEM HCL ER COATED BEADS 240 MG PO CP24: 240.0000 mg | ORAL_CAPSULE | Freq: Every day | ORAL | 3 refills | Status: DC

## 2017-03-21 MED ORDER — FENOFIBRATE MICRONIZED 134 MG PO CAPS: 134.0000 mg | ORAL_CAPSULE | Freq: Every day | ORAL | 3 refills | Status: DC

## 2017-03-21 MED ORDER — METFORMIN HCL 1000 MG PO TABS: 1000.0000 mg | ORAL_TABLET | Freq: Two times a day (BID) | ORAL | 3 refills | Status: DC

## 2017-03-21 MED ORDER — OMEPRAZOLE 40 MG PO CPDR: DELAYED_RELEASE_CAPSULE | ORAL | 3 refills | Status: DC

## 2017-03-21 NOTE — Progress Notes (Signed)
Subjective:  Patient ID: Jon [name], male    DOB: November 19, 1945  Age: 72 y.o. MRN: [id number]  CC: No chief complaint on file.   HPI Jon [name] presents for dyslipidemia, HTN, DM 2 f/u  Outpatient Medications Prior to Visit  Medication Sig Dispense Refill  . aspirin 325 MG EC tablet Take 325 mg by mouth daily.    . Cholecalciferol 1000 UNITS tablet Take 1,000 Units by mouth daily.     Marland Kitchen diltiazem (CARDIZEM CD) 240 MG 24 hr capsule Take 1 capsule (240 mg total) by mouth daily. 90 capsule 3  . fenofibrate micronized (LOFIBRA) 134 MG capsule Take 1 capsule (134 mg total) by mouth daily before breakfast. 90 capsule 1  . metFORMIN (GLUCOPHAGE) 1000 MG tablet Take 1 tablet (1,000 mg total) by mouth 2 (two) times daily with a meal. 180 tablet 3  . Multiple Vitamin (MULTIVITAMIN WITH MINERALS) TABS tablet Take 1 tablet by mouth daily.    . nateglinide (STARLIX) 120 MG tablet Take 1 tablet (120 mg total) by mouth 3 (three) times daily with meals. 90 tablet 11  . omeprazole (PRILOSEC) 40 MG capsule Take 1 capsule by mouth daily 90 capsule 0   No facility-administered medications prior to visit.     ROS Review of Systems  Constitutional: Negative for appetite change, fatigue and unexpected weight change.  HENT: Negative for congestion, nosebleeds, sneezing, sore throat and trouble swallowing.   Eyes: Negative for itching and visual disturbance.  Respiratory: Negative for cough.   Cardiovascular: Negative for chest pain, palpitations and leg swelling.  Gastrointestinal: Negative for abdominal distention, blood in stool, diarrhea and nausea.  Genitourinary: Negative for frequency and hematuria.  Musculoskeletal: Negative for back pain, gait problem, joint swelling and neck pain.  Skin: Negative for rash.  Neurological: Negative for dizziness, tremors, speech difficulty and weakness.  Psychiatric/Behavioral: Negative for agitation, dysphoric mood and sleep disturbance. The patient  is not nervous/anxious.     Objective:  BP 118/72 (BP Location: Left Arm, Patient Position: Sitting, Cuff Size: Large)   Pulse 60   Temp 98.8 F (37.1 C) (Oral)   Ht 5\' 11"  (1.803 m)   Wt 224 lb (101.6 kg)   SpO2 98%   BMI 31.24 kg/m   BP Readings from Last 3 Encounters:  03/21/17 118/72  12/20/16 112/68  11/04/16 114/74    Wt Readings from Last 3 Encounters:  03/21/17 224 lb (101.6 kg)  12/20/16 222 lb (100.7 kg)  11/04/16 217 lb 6.4 oz (98.6 kg)    Physical Exam  Constitutional: He is oriented to person, place, and time. He appears well-developed. No distress.  NAD  HENT:  Mouth/Throat: Oropharynx is clear and moist.  Eyes: Conjunctivae are normal. Pupils are equal, round, and reactive to light.  Neck: Normal range of motion. No JVD present. No thyromegaly present.  Cardiovascular: Normal rate, regular rhythm, normal heart sounds and intact distal pulses. Exam reveals no gallop and no friction rub.  No murmur heard. Pulmonary/Chest: Effort normal and breath sounds normal. No respiratory distress. He has no wheezes. He has no rales. He exhibits no tenderness.  Abdominal: Soft. Bowel sounds are normal. He exhibits no distension and no mass. There is no tenderness. There is no rebound and no guarding.  Musculoskeletal: Normal range of motion. He exhibits no edema or tenderness.  Lymphadenopathy:    He has no cervical adenopathy.  Neurological: He is alert and oriented to person, place, and time. He has normal reflexes. No  cranial nerve deficit. He exhibits normal muscle tone. He displays a negative Romberg sign. Coordination and gait normal.  Skin: Skin is warm and dry. No rash noted.  Psychiatric: He has a normal mood and affect. His behavior is normal. Judgment and thought content normal.    Lab Results  Component Value Date   WBC 6.7 04/13/2016   HGB 15.6 04/13/2016   HCT 45.8 04/13/2016   PLT 363.0 04/13/2016   GLUCOSE 174 (H) 12/20/2016   CHOL 159 12/15/2015    TRIG 222.0 (H) 12/15/2015   HDL 37.00 (L) 12/15/2015   LDLDIRECT 52.0 12/15/2015   LDLCALC 75 12/19/2014   ALT 27 12/15/2015   AST 22 12/15/2015   NA 136 12/20/2016   K 4.0 12/20/2016   CL 100 12/20/2016   CREATININE 1.24 12/20/2016   BUN 22 12/20/2016   CO2 26 12/20/2016   TSH 0.55 12/15/2015   PSA 1.43 12/15/2015   HGBA1C 7.0 (H) 12/20/2016    Dg Chest 2 View  Result Date: 11/23/2015 CLINICAL DATA:  23-year-old male with shortness of breath EXAM: CHEST  2 VIEW COMPARISON:  Chest radiograph dated 11/17/2014 FINDINGS: The heart size and mediastinal contours are within normal limits. Both lungs are clear. The visualized skeletal structures are unremarkable. IMPRESSION: No active cardiopulmonary disease. Electronically Signed   By: Anner Crete M.D.   On: 11/23/2015 22:41    Assessment & Plan:   There are no diagnoses linked to this encounter. I am having Jon [name] maintain his Cholecalciferol, aspirin, multivitamin with minerals, diltiazem, nateglinide, fenofibrate micronized, metFORMIN, and omeprazole.  No orders of the defined types were placed in this encounter.    Follow-up: No Follow-up on file.  Walker Kehr, MD

## 2017-03-21 NOTE — Assessment & Plan Note (Signed)
CBC

## 2017-03-21 NOTE — Addendum Note (Signed)
Addended by: Karren Cobble on: 03/21/2017 08:37 AM   Modules accepted: Orders

## 2017-03-21 NOTE — Assessment & Plan Note (Signed)
Cardizem  

## 2017-03-21 NOTE — Assessment & Plan Note (Signed)
Lopid

## 2017-03-21 NOTE — Assessment & Plan Note (Signed)
Omeprazole

## 2017-03-21 NOTE — Assessment & Plan Note (Signed)
F/u w/GI and Onc Omeprazole

## 2017-03-21 NOTE — Assessment & Plan Note (Signed)
Starlix

## 2017-04-17 DIAGNOSIS — Z961 Presence of intraocular lens: Secondary | ICD-10-CM | POA: Diagnosis not present

## 2017-04-17 DIAGNOSIS — E119 Type 2 diabetes mellitus without complications: Secondary | ICD-10-CM | POA: Diagnosis not present

## 2017-04-17 DIAGNOSIS — H40023 Open angle with borderline findings, high risk, bilateral: Secondary | ICD-10-CM | POA: Diagnosis not present

## 2017-04-17 DIAGNOSIS — H40053 Ocular hypertension, bilateral: Secondary | ICD-10-CM | POA: Diagnosis not present

## 2017-04-17 LAB — HM DIABETES EYE EXAM

## 2017-05-01 ENCOUNTER — Other Ambulatory Visit: Payer: Self-pay | Admitting: Internal Medicine

## 2017-05-25 ENCOUNTER — Encounter: Payer: Self-pay | Admitting: Internal Medicine

## 2017-06-20 ENCOUNTER — Inpatient Hospital Stay: Payer: Medicare Other

## 2017-06-20 ENCOUNTER — Inpatient Hospital Stay: Payer: Medicare Other | Attending: Nurse Practitioner | Admitting: Nurse Practitioner

## 2017-06-20 ENCOUNTER — Encounter: Payer: Self-pay | Admitting: Nurse Practitioner

## 2017-06-20 ENCOUNTER — Telehealth: Payer: Self-pay | Admitting: Emergency Medicine

## 2017-06-20 ENCOUNTER — Telehealth: Payer: Self-pay | Admitting: Nurse Practitioner

## 2017-06-20 VITALS — BP 117/72 | HR 57 | Temp 98.2°F | Resp 17 | Ht 71.0 in | Wt 219.0 lb

## 2017-06-20 DIAGNOSIS — E119 Type 2 diabetes mellitus without complications: Secondary | ICD-10-CM | POA: Diagnosis not present

## 2017-06-20 DIAGNOSIS — C169 Malignant neoplasm of stomach, unspecified: Secondary | ICD-10-CM

## 2017-06-20 DIAGNOSIS — Z923 Personal history of irradiation: Secondary | ICD-10-CM | POA: Insufficient documentation

## 2017-06-20 DIAGNOSIS — Z8509 Personal history of malignant neoplasm of other digestive organs: Secondary | ICD-10-CM

## 2017-06-20 LAB — CEA (IN HOUSE-CHCC): CEA (CHCC-In House): 1.17 ng/mL (ref 0.00–5.00)

## 2017-06-20 NOTE — Telephone Encounter (Signed)
Scheduled appt per 5/21 los -gave patient aVS and calender per los.

## 2017-06-20 NOTE — Progress Notes (Signed)
[place] OFFICE PROGRESS NOTE   Diagnosis: Gastroesophageal carcinoma  INTERVAL HISTORY:   Jon [name] returns as scheduled.  Feels well.  He denies dysphagia.  He has a good appetite.  No weight loss.  He denies pain.  No shortness of breath.  No bowel or bladder problems.  No nausea or vomiting.  Objective:  Vital signs in last 24 hours:  Blood pressure 117/72, pulse (!) 57, temperature 98.2 F (36.8 C), temperature source Oral, resp. rate 17, height 5\' 11"  (1.803 m), weight 219 lb (99.3 kg), SpO2 97 %.    HEENT: Neck without mass.   Lymphatics: No palpable cervical, supraclavicular, axillary or inguinal lymph nodes. Resp: Faint inspiratory rhonchi at the left lung base.  Lungs otherwise clear. Cardio: Regular rate and rhythm. GI: Abdomen soft and nontender.  No hepatomegaly. Vascular: No leg edema.   Lab Results:  Lab Results  Component Value Date   WBC 8.0 03/21/2017   HGB 14.8 03/21/2017   HCT 43.8 03/21/2017   MCV 85.4 03/21/2017   PLT 398.0 03/21/2017   NEUTROABS 4.6 03/21/2017    Imaging:  No results found.  Medications: I have reviewed the patient's current medications.  Assessment/Plan: 1. 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 Staging CT scan 08/27/2010 revealed small gastrohepatic ligament, periportal and retrocrural lymph nodes without other evidence of metastatic disease. Staging PET scan on 09/21/2010 showed abnormal malignant range FDG uptake at the site of the biopsy-proven gastric cancer. Small perigastric lymph nodes were not FDG avid above the background level. The radiologist commented that this may reflect a benign etiology or a false-negative appearance due to the small lymph node size. There was no evidence for distant metastatic disease. The CEA was markedly elevated at 482 on 09/14/2010. He completed radiation 09/20/2010 through  10/29/2010. He received concurrent weekly Taxol/carboplatin chemotherapy 09/22/2010 through 10/27/2010. Restaging PET scan 11/22/2010 showed interval resolution of previously demonstrated hypermetabolic activity within the proximal stomach. There was no evidence of metastatic disease. Restaging PET scan 05/10/2011 showed no evidence of gastroesophageal carcinoma recurrence or metastasis. 2. Status post upper endoscopy 12/17/2010-the very distal portion of the esophagus was ulcerated. The proximal stomach revealed a malignant appearing ulcerative mass in the region of the cardia/gastroesophageal junction. The overall bulk of the lesion was significantly improved but still quite abnormal and large. Multiple biopsies were taken. Pathology was negative for malignancy. 3. Elevated CEA (482) on 09/14/2010.  4. Dysphagia secondary to the lower esophagus/upper gastric tumor, resolved. 5. Microcytic anemia. The hemoglobin was normal on 03/31/2011. There was persistent red cell microcytosis. Hemoglobin and MCV within normal range on 06/19/2014. 6. Sleep apnea. 7. Early diabetes. 8. History of atrial fibrillation. 9. Hyperlipidemia. 10. History of colon polyps. 11. History of Helicobacter pylori infection in 2011. 12. History of neutropenia secondary to chemotherapy, resolved. 13. CT of the chest on 02/15/2011 with left lower lung density/air bronchograms-? Radiation change. 14. Colonoscopy 03/27/2013. Mild melanosis found throughout the entire examined colon. 2 diminutive polyps found in the descending colon (Tubular adenoma. No high-grade dysplasia or malignancy noted). Moderate diverticulosis in the left colon. Followup colonoscopy in 5 years. 15. Nodular hyperpigmented mole at the right upper back . He reports the mole has been evaluated.     Disposition: Jon [name] remains in clinical remission from gastroesophageal carcinoma.  We will follow-up on the CEA from today.  He will return for a  follow-up visit and CEA in  1 year.  He will contact the office in the interim with any problems.    Ned Card ANP/GNP-BC   06/20/2017  9:08 AM

## 2017-06-20 NOTE — Telephone Encounter (Addendum)
Left VM for pt to call back regarding this note.   ----- Message from Owens Shark, NP sent at 06/20/2017 12:45 PM EDT ----- Please let him know the CEA is normal.  Follow-up as scheduled.

## 2017-06-22 ENCOUNTER — Telehealth: Payer: Self-pay

## 2017-06-22 NOTE — Telephone Encounter (Signed)
Spoke with pt regarding message from [facility], LPN. Pt voiced understanding.

## 2017-07-19 ENCOUNTER — Other Ambulatory Visit (INDEPENDENT_AMBULATORY_CARE_PROVIDER_SITE_OTHER): Payer: Medicare Other

## 2017-07-19 ENCOUNTER — Ambulatory Visit: Payer: Medicare Other | Admitting: Internal Medicine

## 2017-07-19 ENCOUNTER — Encounter: Payer: Self-pay | Admitting: Internal Medicine

## 2017-07-19 DIAGNOSIS — C169 Malignant neoplasm of stomach, unspecified: Secondary | ICD-10-CM

## 2017-07-19 DIAGNOSIS — I10 Essential (primary) hypertension: Secondary | ICD-10-CM | POA: Diagnosis not present

## 2017-07-19 DIAGNOSIS — E119 Type 2 diabetes mellitus without complications: Secondary | ICD-10-CM

## 2017-07-19 DIAGNOSIS — D751 Secondary polycythemia: Secondary | ICD-10-CM | POA: Diagnosis not present

## 2017-07-19 DIAGNOSIS — C159 Malignant neoplasm of esophagus, unspecified: Secondary | ICD-10-CM | POA: Diagnosis not present

## 2017-07-19 LAB — BASIC METABOLIC PANEL
BUN: 21 mg/dL (ref 6–23)
CALCIUM: 9.8 mg/dL (ref 8.4–10.5)
CO2: 28 meq/L (ref 19–32)
CREATININE: 1.34 mg/dL (ref 0.40–1.50)
Chloride: 103 mEq/L (ref 96–112)
GFR: 67.37 mL/min (ref >60.00)
GLUCOSE: 108 mg/dL — AB (ref 70–99)
Potassium: 4.1 mEq/L (ref 3.5–5.1)
Sodium: 139 mEq/L (ref 135–145)

## 2017-07-19 LAB — HEMOGLOBIN A1C: Hgb A1c MFr Bld: 7.2 % — ABNORMAL HIGH (ref 4.6–6.5)

## 2017-07-19 NOTE — Progress Notes (Signed)
Subjective:  Patient ID: Jon [name], male    DOB: 15-Sep-1945  Age: 72 y.o. MRN: [id number]  CC: No chief complaint on file.   HPI KYRELL RUACHO presents for HTN, DM, GERD f/u  Outpatient Medications Prior to Visit  Medication Sig Dispense Refill  . aspirin 325 MG EC tablet Take 325 mg by mouth daily.    . Cholecalciferol 1000 UNITS tablet Take 1,000 Units by mouth daily.     Marland Kitchen diltiazem (CARDIZEM CD) 240 MG 24 hr capsule Take 1 capsule (240 mg total) by mouth daily. 90 capsule 3  . fenofibrate micronized (LOFIBRA) 134 MG capsule Take 1 capsule (134 mg total) by mouth daily before breakfast. 90 capsule 3  . metFORMIN (GLUCOPHAGE) 1000 MG tablet Take 1 tablet (1,000 mg total) by mouth 2 (two) times daily with a meal. 180 tablet 3  . Multiple Vitamin (MULTIVITAMIN WITH MINERALS) TABS tablet Take 1 tablet by mouth daily.    . nateglinide (STARLIX) 120 MG tablet Take 1 tablet (120 mg total) by mouth 3 (three) times daily with meals. 90 tablet 3  . omeprazole (PRILOSEC) 40 MG capsule Take 1 capsule by mouth daily 90 capsule 3   No facility-administered medications prior to visit.     ROS: Review of Systems  Constitutional: Negative for appetite change, fatigue and unexpected weight change.  HENT: Negative for congestion, nosebleeds, sneezing, sore throat and trouble swallowing.   Eyes: Negative for itching and visual disturbance.  Respiratory: Negative for cough.   Cardiovascular: Negative for chest pain, palpitations and leg swelling.  Gastrointestinal: Negative for abdominal distention, blood in stool, diarrhea and nausea.  Genitourinary: Negative for frequency and hematuria.  Musculoskeletal: Negative for back pain, gait problem, joint swelling and neck pain.  Skin: Negative for rash.  Neurological: Negative for dizziness, tremors, speech difficulty and weakness.  Psychiatric/Behavioral: Negative for agitation, dysphoric mood and sleep disturbance. The patient is not  nervous/anxious.     Objective:  BP 114/72 (BP Location: Left Arm, Patient Position: Sitting, Cuff Size: Large)   Pulse (!) 53   Temp 98.3 F (36.8 C) (Oral)   Ht 5\' 11"  (1.803 m)   Wt 216 lb (98 kg)   SpO2 98%   BMI 30.13 kg/m   BP Readings from Last 3 Encounters:  07/19/17 114/72  06/20/17 117/72  03/21/17 118/72    Wt Readings from Last 3 Encounters:  07/19/17 216 lb (98 kg)  06/20/17 219 lb (99.3 kg)  03/21/17 224 lb (101.6 kg)    Physical Exam  Constitutional: He is oriented to person, place, and time. He appears well-developed. No distress.  NAD  HENT:  Mouth/Throat: Oropharynx is clear and moist.  Eyes: Pupils are equal, round, and reactive to light. Conjunctivae are normal.  Neck: Normal range of motion. No JVD present. No thyromegaly present.  Cardiovascular: Normal rate, regular rhythm, normal heart sounds and intact distal pulses. Exam reveals no gallop and no friction rub.  No murmur heard. Pulmonary/Chest: Effort normal and breath sounds normal. No respiratory distress. He has no wheezes. He has no rales. He exhibits no tenderness.  Abdominal: Soft. Bowel sounds are normal. He exhibits no distension and no mass. There is no tenderness. There is no rebound and no guarding.  Musculoskeletal: Normal range of motion. He exhibits no edema or tenderness.  Lymphadenopathy:    He has no cervical adenopathy.  Neurological: He is alert and oriented to person, place, and time. He has normal reflexes. No cranial nerve  deficit. He exhibits normal muscle tone. He displays a negative Romberg sign. Coordination and gait normal.  Skin: Skin is warm and dry. No rash noted.  Psychiatric: He has a normal mood and affect. His behavior is normal. Judgment and thought content normal.    Lab Results  Component Value Date   WBC 8.0 03/21/2017   HGB 14.8 03/21/2017   HCT 43.8 03/21/2017   PLT 398.0 03/21/2017   GLUCOSE 157 (H) 03/21/2017   CHOL 159 12/15/2015   TRIG 222.0 (H)  12/15/2015   HDL 37.00 (L) 12/15/2015   LDLDIRECT 52.0 12/15/2015   LDLCALC 75 12/19/2014   ALT 20 03/21/2017   AST 20 03/21/2017   NA 135 03/21/2017   K 4.4 03/21/2017   CL 99 03/21/2017   CREATININE 1.28 03/21/2017   BUN 20 03/21/2017   CO2 28 03/21/2017   TSH 0.44 03/21/2017   PSA 1.43 12/15/2015   HGBA1C 7.1 (H) 03/21/2017    Dg Chest 2 View  Result Date: 11/23/2015 CLINICAL DATA:  74-year-old male with shortness of breath EXAM: CHEST  2 VIEW COMPARISON:  Chest radiograph dated 11/17/2014 FINDINGS: The heart size and mediastinal contours are within normal limits. Both lungs are clear. The visualized skeletal structures are unremarkable. IMPRESSION: No active cardiopulmonary disease. Electronically Signed   By: Anner Crete M.D.   On: 11/23/2015 22:41    Assessment & Plan:   There are no diagnoses linked to this encounter.   No orders of the defined types were placed in this encounter.    Follow-up: No follow-ups on file.  Walker Kehr, MD

## 2017-07-19 NOTE — Assessment & Plan Note (Signed)
CBC

## 2017-07-19 NOTE — Assessment & Plan Note (Signed)
F/u w/Dr Sherrill 

## 2017-07-19 NOTE — Assessment & Plan Note (Signed)
Metformin, Starlix Statins discussed - LDL at goal

## 2017-07-19 NOTE — Assessment & Plan Note (Signed)
Cardizem  

## 2017-07-19 NOTE — Assessment & Plan Note (Signed)
Dilacor

## 2017-07-31 ENCOUNTER — Other Ambulatory Visit: Payer: Self-pay | Admitting: *Deleted

## 2017-07-31 MED ORDER — NATEGLINIDE 120 MG PO TABS: 120.0000 mg | ORAL_TABLET | Freq: Three times a day (TID) | ORAL | 3 refills | Status: DC

## 2017-09-18 ENCOUNTER — Other Ambulatory Visit: Payer: Self-pay | Admitting: Internal Medicine

## 2017-10-16 DIAGNOSIS — H40023 Open angle with borderline findings, high risk, bilateral: Secondary | ICD-10-CM | POA: Diagnosis not present

## 2017-11-10 ENCOUNTER — Ambulatory Visit: Payer: Medicare Other | Admitting: Pulmonary Disease

## 2017-11-10 ENCOUNTER — Encounter: Payer: Self-pay | Admitting: Pulmonary Disease

## 2017-11-10 DIAGNOSIS — G4733 Obstructive sleep apnea (adult) (pediatric): Secondary | ICD-10-CM | POA: Diagnosis not present

## 2017-11-10 NOTE — Patient Instructions (Signed)
Obtain CPAP download from [facility]

## 2017-11-10 NOTE — Progress Notes (Signed)
   Subjective:    Patient ID: Jon [name], male    DOB: August 06, 1945, 72 y.o.   MRN: [id number]  HPI  73 yo for follow-up of OSA He underwent chemo-RT for esophageal adenoCA in 2012 (sherrill & Henrene Pastor) complicated by LLL radiation pneumonitis  His machine is about 72 years old but seems to be working well.  He problems with mask or pressure.  He denies dryness.  Weight is unchanged. He has annual follow-up with oncology for the monitor his CEA levels. No problems with breathing he has been getting his supplies on time as needed DME apparently tried to download last year but this is not available to me at present he does not have a card in his machine He refuses flu shot today  Significant tests/ events reviewed  NPSG 2007: AHI 74/hr  Review of Systems Patient denies significant dyspnea,cough, hemoptysis,  chest pain, palpitations, pedal edema, orthopnea, paroxysmal nocturnal dyspnea, lightheadedness, nausea, vomiting, abdominal or  leg pains      Objective:   Physical Exam   Gen. Pleasant, well-nourished, in no distress ENT - no thrush, no post nasal drip Neck: No JVD, no thyromegaly, no carotid bruits Lungs: no use of accessory muscles, no dullness to percussion, clear without rales or rhonchi  Cardiovascular: Rhythm regular, heart sounds  normal, no murmurs or gallops, no peripheral edema Musculoskeletal: No deformities, no cyanosis or clubbing         Assessment & Plan:

## 2017-11-10 NOTE — Assessment & Plan Note (Signed)
Obtain [facility]AP download from [facility] He is compliant by report.  CPAP is certainly helped improve his daytime somnolence and fatigue. He will be eligible for a new machine as and when needed  Advised against medications with sedative side effects Cautioned against driving when sleepy - understanding that sleepiness will vary on a day to day basis

## 2017-11-22 ENCOUNTER — Ambulatory Visit: Payer: Medicare Other | Admitting: Internal Medicine

## 2017-11-22 ENCOUNTER — Encounter: Payer: Self-pay | Admitting: Internal Medicine

## 2017-11-22 ENCOUNTER — Other Ambulatory Visit (INDEPENDENT_AMBULATORY_CARE_PROVIDER_SITE_OTHER): Payer: Medicare Other

## 2017-11-22 DIAGNOSIS — E119 Type 2 diabetes mellitus without complications: Secondary | ICD-10-CM

## 2017-11-22 DIAGNOSIS — D751 Secondary polycythemia: Secondary | ICD-10-CM

## 2017-11-22 DIAGNOSIS — C169 Malignant neoplasm of stomach, unspecified: Secondary | ICD-10-CM

## 2017-11-22 DIAGNOSIS — K2100 Gastro-esophageal reflux disease with esophagitis, without bleeding: Secondary | ICD-10-CM

## 2017-11-22 LAB — BASIC METABOLIC PANEL
BUN: 17 mg/dL (ref 6–23)
CO2: 28 meq/L (ref 19–32)
Calcium: 9.8 mg/dL (ref 8.4–10.5)
Chloride: 101 mEq/L (ref 96–112)
Creatinine, Ser: 1.36 mg/dL (ref 0.40–1.50)
GFR: 66.16 mL/min (ref >60.00)
GLUCOSE: 132 mg/dL — AB (ref 70–99)
POTASSIUM: 4.1 meq/L (ref 3.5–5.1)
SODIUM: 138 meq/L (ref 135–145)

## 2017-11-22 LAB — CBC WITH DIFFERENTIAL/PLATELET
BASOS PCT: 1 % (ref 0.0–3.0)
Basophils Absolute: 0.1 10*3/uL (ref 0.0–0.1)
EOS PCT: 4.6 % (ref 0.0–5.0)
Eosinophils Absolute: 0.4 10*3/uL (ref 0.0–0.7)
HCT: 45.5 % (ref 39.0–52.0)
Hemoglobin: 15.5 g/dL (ref 13.0–17.0)
LYMPHS ABS: 2.8 10*3/uL (ref 0.7–4.0)
Lymphocytes Relative: 33.2 % (ref 12.0–46.0)
MCHC: 33.9 g/dL (ref 30.0–36.0)
MCV: 85.1 fl (ref 78.0–100.0)
MONO ABS: 1 10*3/uL (ref 0.1–1.0)
Monocytes Relative: 12.5 % — ABNORMAL HIGH (ref 3.0–12.0)
NEUTROS ABS: 4.1 10*3/uL (ref 1.4–7.7)
NEUTROS PCT: 48.7 % (ref 43.0–77.0)
Platelets: 405 10*3/uL — ABNORMAL HIGH (ref 150.0–400.0)
RBC: 5.35 Mil/uL (ref 4.22–5.81)
RDW: 15.4 % (ref 11.5–15.5)
WBC: 8.3 10*3/uL (ref 4.0–10.5)

## 2017-11-22 LAB — HEPATIC FUNCTION PANEL
ALT: 24 U/L (ref 0–53)
AST: 22 U/L (ref 0–37)
Albumin: 4.5 g/dL (ref 3.5–5.2)
Alkaline Phosphatase: 60 U/L (ref 39–117)
BILIRUBIN TOTAL: 0.3 mg/dL (ref 0.2–1.2)
Bilirubin, Direct: 0.1 mg/dL (ref 0.0–0.3)
Total Protein: 7.2 g/dL (ref 6.0–8.3)

## 2017-11-22 NOTE — Assessment & Plan Note (Signed)
Metformin, Starlix Statins discussed - LDL at goal

## 2017-11-22 NOTE — Assessment & Plan Note (Signed)
CBC

## 2017-11-22 NOTE — Progress Notes (Signed)
Subjective:  Patient ID: Jon [name], male    DOB: 05/06/1945  Age: 72 y.o. MRN: [id number]  CC: No chief complaint on file.   HPI WARDEN BUFFA presents for HTN, dyslipidemia, GERD f/u  Outpatient Medications Prior to Visit  Medication Sig Dispense Refill  . aspirin 325 MG EC tablet Take 325 mg by mouth daily.    . Cholecalciferol 1000 UNITS tablet Take 1,000 Units by mouth daily.     Marland Kitchen diltiazem (CARDIZEM CD) 240 MG 24 hr capsule Take 1 capsule (240 mg total) by mouth daily. 90 capsule 3  . fenofibrate micronized (LOFIBRA) 134 MG capsule Take 1 capsule (134 mg total) by mouth daily before breakfast. 90 capsule 3  . metFORMIN (GLUCOPHAGE) 1000 MG tablet Take 1 tablet (1,000 mg total) by mouth 2 (two) times daily with a meal. 180 tablet 3  . Multiple Vitamin (MULTIVITAMIN WITH MINERALS) TABS tablet Take 1 tablet by mouth daily.    . nateglinide (STARLIX) 120 MG tablet Take 1 tablet (120 mg total) by mouth 3 (three) times daily with meals. 90 tablet 3  . omeprazole (PRILOSEC) 40 MG capsule Take 1 capsule by mouth daily 90 capsule 3  . omeprazole (PRILOSEC) 40 MG capsule TAKE 1 CAPSULE BY MOUTH DAILY 90 capsule 1   No facility-administered medications prior to visit.     ROS: Review of Systems  Constitutional: Negative for appetite change, fatigue and unexpected weight change.  HENT: Negative for congestion, nosebleeds, sneezing, sore throat and trouble swallowing.   Eyes: Negative for itching and visual disturbance.  Respiratory: Negative for cough.   Cardiovascular: Negative for chest pain, palpitations and leg swelling.  Gastrointestinal: Negative for abdominal distention, blood in stool, diarrhea and nausea.  Genitourinary: Negative for frequency and hematuria.  Musculoskeletal: Negative for back pain, gait problem, joint swelling and neck pain.  Skin: Negative for rash.  Neurological: Negative for dizziness, tremors, speech difficulty and weakness.    Psychiatric/Behavioral: Negative for agitation, dysphoric mood and sleep disturbance. The patient is not nervous/anxious.     Objective:  BP 116/78 (BP Location: Left Arm, Patient Position: Sitting, Cuff Size: Large)   Pulse (!) 54   Temp 98 F (36.7 C) (Oral)   Ht 5\' 10"  (1.778 m)   Wt 223 lb (101.2 kg)   SpO2 99%   BMI 32.00 kg/m   BP Readings from Last 3 Encounters:  11/22/17 116/78  11/10/17 116/80  07/19/17 114/72    Wt Readings from Last 3 Encounters:  11/22/17 223 lb (101.2 kg)  11/10/17 220 lb 6.4 oz (100 kg)  07/19/17 216 lb (98 kg)    Physical Exam  Constitutional: He is oriented to person, place, and time. He appears well-developed and well-nourished. No distress.  HENT:  Head: Normocephalic and atraumatic.  Right Ear: External ear normal.  Left Ear: External ear normal.  Nose: Nose normal.  Mouth/Throat: Oropharynx is clear and moist. No oropharyngeal exudate.  Eyes: Pupils are equal, round, and reactive to light. Conjunctivae and EOM are normal. Right eye exhibits no discharge. Left eye exhibits no discharge. No scleral icterus.  Neck: Normal range of motion. Neck supple. No JVD present. No tracheal deviation present. No thyromegaly present.  Cardiovascular: Normal rate, regular rhythm, normal heart sounds and intact distal pulses. Exam reveals no gallop and no friction rub.  No murmur heard. Pulmonary/Chest: Effort normal and breath sounds normal. No stridor. No respiratory distress. He has no wheezes. He has no rales. He exhibits no tenderness.  Abdominal: Soft. Bowel sounds are normal. He exhibits no distension and no mass. There is no tenderness. There is no rebound and no guarding.  Musculoskeletal: Normal range of motion. He exhibits no edema or tenderness.  Lymphadenopathy:    He has no cervical adenopathy.  Neurological: He is alert and oriented to person, place, and time. He has normal reflexes. He displays normal reflexes. No cranial nerve deficit.  He exhibits normal muscle tone. Coordination normal.  Skin: Skin is warm and dry. No rash noted. He is not diaphoretic. No erythema. No pallor.  Psychiatric: He has a normal mood and affect. His behavior is normal. Judgment and thought content normal.    Lab Results  Component Value Date   WBC 8.0 03/21/2017   HGB 14.8 03/21/2017   HCT 43.8 03/21/2017   PLT 398.0 03/21/2017   GLUCOSE 108 (H) 07/19/2017   CHOL 159 12/15/2015   TRIG 222.0 (H) 12/15/2015   HDL 37.00 (L) 12/15/2015   LDLDIRECT 52.0 12/15/2015   LDLCALC 75 12/19/2014   ALT 20 03/21/2017   AST 20 03/21/2017   NA 139 07/19/2017   K 4.1 07/19/2017   CL 103 07/19/2017   CREATININE 1.34 07/19/2017   BUN 21 07/19/2017   CO2 28 07/19/2017   TSH 0.44 03/21/2017   PSA 1.43 12/15/2015   HGBA1C 7.2 (H) 07/19/2017    Dg Chest 2 View  Result Date: 11/23/2015 CLINICAL DATA:  77-year-old male with shortness of breath EXAM: CHEST  2 VIEW COMPARISON:  Chest radiograph dated 11/17/2014 FINDINGS: The heart size and mediastinal contours are within normal limits. Both lungs are clear. The visualized skeletal structures are unremarkable. IMPRESSION: No active cardiopulmonary disease. Electronically Signed   By: Anner Crete M.D.   On: 11/23/2015 22:41    Assessment & Plan:   There are no diagnoses linked to this encounter.   No orders of the defined types were placed in this encounter.    Follow-up: No follow-ups on file.  Walker Kehr, MD

## 2017-11-22 NOTE — Assessment & Plan Note (Signed)
On Prilosec

## 2017-11-22 NOTE — Assessment & Plan Note (Signed)
S/p Chemo and XRT

## 2017-12-22 ENCOUNTER — Other Ambulatory Visit: Payer: Self-pay | Admitting: Internal Medicine

## 2017-12-22 MED ORDER — NATEGLINIDE 120 MG PO TABS: 120.0000 mg | ORAL_TABLET | Freq: Three times a day (TID) | ORAL | 3 refills | Status: DC

## 2017-12-22 NOTE — Telephone Encounter (Signed)
Copied from [place] [id number]. Topic: Quick Communication - Rx Refill/Question >> Dec 22, 2017  9:20 AM Judyann Munson wrote: Medication: nateglinide (STARLIX) 120 MG tablet  Has the patient contacted their pharmacy?no   Preferred Pharmacy (with phone number or street name): [facility] [place] [phone] (Phone) [phone] (Fax)    Agent: Please be advised that RX refills may take up to 3 business days. We ask that you follow-up with your pharmacy.

## 2018-02-07 ENCOUNTER — Telehealth: Payer: Self-pay | Admitting: Internal Medicine

## 2018-02-07 NOTE — Telephone Encounter (Signed)
Copied from [place] [phone]. Topic: Quick Communication - See Telephone Encounter >> Feb 07, 2018  2:30 PM Burchel, Abbi R wrote: CRM for notification. See Telephone encounter for: 02/07/18.  Margreta Journey Engineer, manufacturing) requesting call back from clinical staff re: pt's medication list  [phone]

## 2018-02-13 NOTE — Telephone Encounter (Signed)
Pharmacist was wonder about statin use, she was informed pt is not on one and if provider wanted him on one, he would be

## 2018-03-13 ENCOUNTER — Other Ambulatory Visit: Payer: Self-pay | Admitting: Internal Medicine

## 2018-03-13 MED ORDER — OMEPRAZOLE 40 MG PO CPDR: 40.0000 mg | DELAYED_RELEASE_CAPSULE | Freq: Every day | ORAL | 3 refills | Status: DC

## 2018-03-13 MED ORDER — FENOFIBRATE MICRONIZED 134 MG PO CAPS: 134.0000 mg | ORAL_CAPSULE | Freq: Every day | ORAL | 0 refills | Status: DC

## 2018-03-13 MED ORDER — DILTIAZEM HCL ER COATED BEADS 240 MG PO CP24: 240.0000 mg | ORAL_CAPSULE | Freq: Every day | ORAL | 1 refills | Status: DC

## 2018-03-13 MED ORDER — NATEGLINIDE 120 MG PO TABS: 120.0000 mg | ORAL_TABLET | Freq: Three times a day (TID) | ORAL | 1 refills | Status: DC

## 2018-03-13 MED ORDER — METFORMIN HCL 1000 MG PO TABS: 1000.0000 mg | ORAL_TABLET | Freq: Two times a day (BID) | ORAL | 1 refills | Status: DC

## 2018-03-13 NOTE — Telephone Encounter (Signed)
Requested medication (s) are due for refill today:  Yes   Requested medication (s) are on the active medication list:  yes  Future visit scheduled:  yes  Last Refill: 03/21/17; # 90; RF x 3 *last lipid panel 2017    Requested Prescriptions  Pending Prescriptions Disp Refills   fenofibrate micronized (LOFIBRA) 134 MG capsule 90 capsule 3    Sig: Take 1 capsule (134 mg total) by mouth daily before breakfast.     Cardiovascular:  Antilipid - Fibric Acid Derivatives Failed - 03/13/2018  9:19 AM      Failed - Total Cholesterol in normal range and within 360 days    Cholesterol  Date Value Ref Range Status  12/15/2015 159 0 - 200 mg/dL Final    Comment:    ATP III Classification       Desirable:  < 200 mg/dL               Borderline High:  200 - 239 mg/dL          High:  > = 240 mg/dL         Failed - LDL in normal range and within 360 days    LDL Cholesterol  Date Value Ref Range Status  12/19/2014 75 0 - 99 mg/dL Final         Failed - HDL in normal range and within 360 days    HDL  Date Value Ref Range Status  12/15/2015 37.00 (L) >39.00 mg/dL Final         Failed - Triglycerides in normal range and within 360 days    Triglycerides  Date Value Ref Range Status  12/15/2015 222.0 (H) 0.0 - 149.0 mg/dL Final    Comment:    Normal:  <150 mg/dLBorderline High:  150 - 199 mg/dL         Passed - ALT in normal range and within 180 days    ALT  Date Value Ref Range Status  11/22/2017 24 0 - 53 U/L Final         Passed - AST in normal range and within 180 days    AST  Date Value Ref Range Status  11/22/2017 22 0 - 37 U/L Final         Passed - Cr in normal range and within 180 days    Creat  Date Value Ref Range Status  02/15/2011 1.20 0.50 - 1.35 mg/dL Final   Creatinine, Ser  Date Value Ref Range Status  11/22/2017 1.36 0.40 - 1.50 mg/dL Final         Passed - eGFR in normal range and within 180 days    GFR calc Af Amer  Date Value Ref Range Status  11/23/2015  52 (L) >60 mL/min Final    Comment:    (NOTE) The eGFR has been calculated using the CKD EPI equation. This calculation has not been validated in all clinical situations. eGFR's persistently <60 mL/min signify possible Chronic Kidney Disease.    GFR calc non Af Amer  Date Value Ref Range Status  11/23/2015 45 (L) >60 mL/min Final   GFR  Date Value Ref Range Status  11/22/2017 66.16 >60.00 mL/min Final         Passed - Valid encounter within last 12 months    Recent Outpatient Visits          3 months ago Gastroesophageal reflux disease with esophagitis   [facility], Evie Lacks, MD  7 months ago Essential hypertension   [place] Plotnikov, Evie Lacks, MD   11 months ago Need for pneumococcal vaccination   [facility], Evie Lacks, MD   1 year ago Essential hypertension   [facility], Evie Lacks, MD   1 year ago Essential hypertension   [facility], Evie Lacks, MD      Future Appointments            In 2 weeks Plotnikov, Evie Lacks, MD [facility], [place]         Signed Prescriptions Disp Refills   diltiazem (CARDIZEM CD) 240 MG 24 hr capsule 90 capsule 1    Sig: Take 1 capsule (240 mg total) by mouth daily.     Cardiovascular:  Calcium Channel Blockers Passed - 03/13/2018  9:19 AM      Passed - Last BP in normal range    BP Readings from Last 1 Encounters:  11/22/17 116/78         Passed - Valid encounter within last 6 months    Recent Outpatient Visits          3 months ago Gastroesophageal reflux disease with esophagitis   [facility], Evie Lacks, MD   7 months ago Essential hypertension   [place] Plotnikov, Evie Lacks, MD   11 months ago Need for pneumococcal vaccination   [place] Plotnikov, Evie Lacks, MD   1 year ago Essential hypertension   [facility], Evie Lacks, MD   1 year ago Essential hypertension   [place] Plotnikov, Evie Lacks, MD      Future Appointments            In 2 weeks Plotnikov, Evie Lacks, MD [place]          metFORMIN (GLUCOPHAGE) 1000 MG tablet 180 tablet 1    Sig: Take 1 tablet (1,000 mg total) by mouth 2 (two) times daily with a meal.     Endocrinology:  Diabetes - Biguanides Failed - 03/13/2018  9:19 AM      Failed - HBA1C is between 0 and 7.9 and within 180 days    Hgb A1c MFr Bld  Date Value Ref Range Status  07/19/2017 7.2 (H) 4.6 - 6.5 % Final    Comment:    Glycemic Control Guidelines for People with Diabetes:Non Diabetic:  <6%Goal of Therapy: <7%Additional Action Suggested:  >8%          Passed - Cr in normal range and within 360 days    Creat  Date Value Ref Range Status  02/15/2011 1.20 0.50 - 1.35 mg/dL Final   Creatinine, Ser  Date Value Ref Range Status  11/22/2017 1.36 0.40 - 1.50 mg/dL Final         Passed - eGFR in normal range and within 360 days    GFR calc Af Amer  Date Value Ref Range Status  11/23/2015 52 (L) >60 mL/min Final    Comment:    (NOTE) The eGFR has been calculated using the CKD EPI equation. This calculation has not been validated in all clinical situations. eGFR's persistently <60 mL/min signify possible Chronic Kidney Disease.    GFR calc non Af Amer  Date Value Ref Range Status  11/23/2015 45 (L) >60 mL/min  Final   GFR  Date Value Ref Range Status  11/22/2017 66.16 >60.00 mL/min Final         Passed - Valid encounter within last 6 months    Recent Outpatient Visits          3 months ago Gastroesophageal reflux disease with esophagitis   [facility], Evie Lacks, MD   7 months ago Essential hypertension   [place] Plotnikov, Evie Lacks, MD   11 months ago Need for pneumococcal vaccination   [facility], Evie Lacks, MD   1 year ago Essential hypertension   [place] Plotnikov, Evie Lacks, MD   1 year ago Essential hypertension   [facility], Evie Lacks, MD      Future Appointments            In 2 weeks Plotnikov, Evie Lacks, MD [facility]          nateglinide (STARLIX) 120 MG tablet 270 tablet 1    Sig: Take 1 tablet (120 mg total) by mouth 3 (three) times daily with meals.     Endocrinology:  Diabetes - Meglitinides Failed - 03/13/2018  9:19 AM      Failed - HBA1C is between 0 and 7.9 and within 180 days    Hgb A1c MFr Bld  Date Value Ref Range Status  07/19/2017 7.2 (H) 4.6 - 6.5 % Final    Comment:    Glycemic Control Guidelines for People with Diabetes:Non Diabetic:  <6%Goal of Therapy: <7%Additional Action Suggested:  >8%          Passed - Valid encounter within last 6 months    Recent Outpatient Visits          3 months ago Gastroesophageal reflux disease with esophagitis   [place] Plotnikov, Evie Lacks, MD   7 months ago Essential hypertension   [place] Plotnikov, Evie Lacks, MD   11 months ago Need for pneumococcal vaccination   [place] Plotnikov, Evie Lacks, MD   1 year ago Essential hypertension   [facility], Evie Lacks, MD   1 year ago Essential hypertension   [place] Plotnikov, Evie Lacks, MD      Future Appointments            In 2 weeks Plotnikov, Evie Lacks, MD [place]          omeprazole (PRILOSEC) 40 MG capsule 90 capsule 3    Sig: Take 1 capsule (40 mg total) by mouth daily.     Gastroenterology: Proton Pump Inhibitors Passed - 03/13/2018  9:19  AM      Passed - Valid encounter within last 12 months    Recent Outpatient Visits          3 months ago Gastroesophageal reflux disease with esophagitis   [place], MD   7 months ago Essential hypertension   [facility], Evie Lacks, MD   11 months ago Need for pneumococcal vaccination   [facility], Evie Lacks, MD   1 year ago Essential hypertension   [facility], Evie Lacks, MD   1 year ago Essential hypertension   [facility], Evie Lacks, MD      Future Appointments            In 2 weeks Plotnikov, Evie Lacks, MD [place], [facility]          Requested Prescriptions  Pending Prescriptions Disp Refills   fenofibrate micronized (LOFIBRA) 134 MG capsule 90 capsule 3    Sig: Take 1 capsule (134 mg total) by mouth daily before breakfast.     Cardiovascular:  Antilipid - Fibric Acid Derivatives Failed - 03/13/2018  9:19 AM      Failed - Total Cholesterol in normal range and within 360 days    Cholesterol  Date Value Ref Range Status  12/15/2015 159 0 - 200 mg/dL Final    Comment:    ATP III Classification       Desirable:  < 200 mg/dL               Borderline High:  200 - 239 mg/dL          High:  > = 240 mg/dL         Failed - LDL in normal range and within 360 days    LDL Cholesterol  Date Value Ref Range Status  12/19/2014 75 0 - 99 mg/dL Final         Failed - HDL in normal range and within 360 days    HDL  Date Value Ref Range Status  12/15/2015 37.00 (L) >39.00 mg/dL Final         Failed - Triglycerides in normal range and within 360 days    Triglycerides  Date Value Ref Range Status  12/15/2015 222.0 (H) 0.0 - 149.0 mg/dL Final    Comment:    Normal:  <150 mg/dLBorderline High:  150 - 199 mg/dL         Passed - ALT in normal range and within 180  days    ALT  Date Value Ref Range Status  11/22/2017 24 0 - 53 U/L Final         Passed - AST in normal range and within 180 days    AST  Date Value Ref Range Status  11/22/2017 22 0 - 37 U/L Final         Passed - Cr in normal range and within 180 days    Creat  Date Value Ref Range Status  02/15/2011 1.20 0.50 - 1.35 mg/dL Final   Creatinine, Ser  Date Value Ref Range Status  11/22/2017 1.36 0.40 - 1.50 mg/dL Final         Passed - eGFR in normal range and within 180 days    GFR calc Af Amer  Date Value Ref Range Status  11/23/2015 52 (L) >60 mL/min Final    Comment:    (NOTE) The eGFR has been calculated using the CKD EPI equation. This calculation has not been validated in all clinical situations. eGFR's persistently <60 mL/min signify possible Chronic Kidney Disease.    GFR calc non Af Amer  Date Value Ref Range Status  11/23/2015 45 (L) >60 mL/min Final   GFR  Date Value Ref Range Status  11/22/2017 66.16 >60.00 mL/min Final         Passed - Valid encounter within last 12 months    Recent Outpatient Visits          3 months ago Gastroesophageal reflux disease with esophagitis   [facility], [place]  V, MD   7 months ago Essential hypertension   [place] Plotnikov, Evie Lacks, MD   11 months ago Need for pneumococcal vaccination   [facility], Evie Lacks, MD   1 year ago Essential hypertension   [facility], Evie Lacks, MD   1 year ago Essential hypertension   [facility], Evie Lacks, MD      Future Appointments            In 2 weeks Plotnikov, Evie Lacks, MD [facility], [place]         Signed Prescriptions Disp Refills   diltiazem (CARDIZEM CD) 240 MG 24 hr capsule 90 capsule 1    Sig: Take 1 capsule (240 mg total) by mouth daily.     Cardiovascular:   Calcium Channel Blockers Passed - 03/13/2018  9:19 AM      Passed - Last BP in normal range    BP Readings from Last 1 Encounters:  11/22/17 116/78         Passed - Valid encounter within last 6 months    Recent Outpatient Visits          3 months ago Gastroesophageal reflux disease with esophagitis   [facility], Evie Lacks, MD   7 months ago Essential hypertension   [place] Plotnikov, Evie Lacks, MD   11 months ago Need for pneumococcal vaccination   [place] Plotnikov, Evie Lacks, MD   1 year ago Essential hypertension   [facility], Evie Lacks, MD   1 year ago Essential hypertension   [place] Plotnikov, Evie Lacks, MD      Future Appointments            In 2 weeks Plotnikov, Evie Lacks, MD [place]          metFORMIN (GLUCOPHAGE) 1000 MG tablet 180 tablet 1    Sig: Take 1 tablet (1,000 mg total) by mouth 2 (two) times daily with a meal.     Endocrinology:  Diabetes - Biguanides Failed - 03/13/2018  9:19 AM      Failed - HBA1C is between 0 and 7.9 and within 180 days    Hgb A1c MFr Bld  Date Value Ref Range Status  07/19/2017 7.2 (H) 4.6 - 6.5 % Final    Comment:    Glycemic Control Guidelines for People with Diabetes:Non Diabetic:  <6%Goal of Therapy: <7%Additional Action Suggested:  >8%          Passed - Cr in normal range and within 360 days    Creat  Date Value Ref Range Status  02/15/2011 1.20 0.50 - 1.35 mg/dL Final   Creatinine, Ser  Date Value Ref Range Status  11/22/2017 1.36 0.40 - 1.50 mg/dL Final         Passed - eGFR in normal range and within 360 days    GFR calc Af Amer  Date Value Ref Range Status  11/23/2015 52 (L) >60 mL/min Final    Comment:    (NOTE) The eGFR has been calculated using the CKD EPI equation. This calculation has not been validated in all  clinical situations. eGFR's persistently <60 mL/min signify possible Chronic Kidney Disease.    GFR calc non Af Amer  Date Value Ref Range Status  11/23/2015 45 (L) >60 mL/min Final   GFR  Date Value Ref Range Status  11/22/2017 66.16 >60.00 mL/min Final         Passed - Valid encounter within last 6 months    Recent Outpatient Visits          3 months ago Gastroesophageal reflux disease with esophagitis   [facility], Evie Lacks, MD   7 months ago Essential hypertension   [place] Plotnikov, Evie Lacks, MD   11 months ago Need for pneumococcal vaccination   [facility], Evie Lacks, MD   1 year ago Essential hypertension   [place] Plotnikov, Evie Lacks, MD   1 year ago Essential hypertension   [facility], Evie Lacks, MD      Future Appointments            In 2 weeks Plotnikov, Evie Lacks, MD [facility]          nateglinide (STARLIX) 120 MG tablet 270 tablet 1    Sig: Take 1 tablet (120 mg total) by mouth 3 (three) times daily with meals.     Endocrinology:  Diabetes - Meglitinides Failed - 03/13/2018  9:19 AM      Failed - HBA1C is between 0 and 7.9 and within 180 days    Hgb A1c MFr Bld  Date Value Ref Range Status  07/19/2017 7.2 (H) 4.6 - 6.5 % Final    Comment:    Glycemic Control Guidelines for People with Diabetes:Non Diabetic:  <6%Goal of Therapy: <7%Additional Action Suggested:  >8%          Passed - Valid encounter within last 6 months    Recent Outpatient Visits          3 months ago Gastroesophageal reflux disease with esophagitis   [place] Plotnikov, Evie Lacks, MD   7 months ago Essential hypertension   [place] Plotnikov, Evie Lacks, MD   11 months ago Need for pneumococcal vaccination   [place] Plotnikov, Evie Lacks, MD   1 year ago Essential hypertension   [facility], Evie Lacks, MD   1 year ago Essential hypertension   [place] Plotnikov, Evie Lacks, MD      Future Appointments            In 2 weeks Plotnikov, Evie Lacks, MD [place]          omeprazole (PRILOSEC) 40 MG capsule 90 capsule 3    Sig: Take 1 capsule (40 mg total) by mouth daily.     Gastroenterology: Proton Pump Inhibitors Passed - 03/13/2018  9:19 AM      Passed - Valid encounter within last 12 months    Recent Outpatient Visits          3 months ago Gastroesophageal reflux disease with esophagitis   [facility], Evie Lacks, MD   7 months ago Essential hypertension   [facility], Evie Lacks, MD   11 months ago Need for pneumococcal vaccination   [place], Evie Lacks, MD   1 year ago Essential hypertension   [place], Evie Lacks, MD   1 year ago Essential hypertension  [facility], Evie Lacks, MD      Future Appointments            In 2 weeks Plotnikov, Evie Lacks, MD [place], [place]

## 2018-03-13 NOTE — Telephone Encounter (Signed)
Copied from [place] [phone]. Topic: Quick Communication - Rx Refill/Question >> Mar 13, 2018  9:07 AM Virl Axe D wrote: Medication: diltiazem (CARDIZEM CD) 240 MG 24 hr capsule / fenofibrate micronized (LOFIBRA) 134 MG capsule / metFORMIN (GLUCOPHAGE) 1000 MG tablet / omeprazole (PRILOSEC) 40 MG capsule / nateglinide (STARLIX) 120 MG tablet / Pt stated the pharmacy sent requests with no result for refills. Please advise.  Has the patient contacted their pharmacy? Yes (Agent: If no, request that the patient contact the pharmacy for the refill.) (Agent: If yes, when and what did the pharmacy advise?)  Preferred Pharmacy (with phone number or street name): [facility] [place], [place] - [place] [phone] (Phone) [phone] (Fax)  Agent: Please be advised that RX refills may take up to 3 business days. We ask that you follow-up with your pharmacy.

## 2018-03-28 ENCOUNTER — Other Ambulatory Visit (INDEPENDENT_AMBULATORY_CARE_PROVIDER_SITE_OTHER): Payer: Medicare Other

## 2018-03-28 ENCOUNTER — Encounter: Payer: Self-pay | Admitting: Internal Medicine

## 2018-03-28 ENCOUNTER — Ambulatory Visit (INDEPENDENT_AMBULATORY_CARE_PROVIDER_SITE_OTHER): Payer: Medicare Other | Admitting: Internal Medicine

## 2018-03-28 DIAGNOSIS — R202 Paresthesia of skin: Secondary | ICD-10-CM | POA: Insufficient documentation

## 2018-03-28 DIAGNOSIS — C169 Malignant neoplasm of stomach, unspecified: Secondary | ICD-10-CM

## 2018-03-28 DIAGNOSIS — D751 Secondary polycythemia: Secondary | ICD-10-CM | POA: Diagnosis not present

## 2018-03-28 DIAGNOSIS — E119 Type 2 diabetes mellitus without complications: Secondary | ICD-10-CM | POA: Diagnosis not present

## 2018-03-28 DIAGNOSIS — I10 Essential (primary) hypertension: Secondary | ICD-10-CM

## 2018-03-28 DIAGNOSIS — I2583 Coronary atherosclerosis due to lipid rich plaque: Secondary | ICD-10-CM

## 2018-03-28 DIAGNOSIS — E785 Hyperlipidemia, unspecified: Secondary | ICD-10-CM | POA: Diagnosis not present

## 2018-03-28 DIAGNOSIS — I2510 Atherosclerotic heart disease of native coronary artery without angina pectoris: Secondary | ICD-10-CM | POA: Diagnosis not present

## 2018-03-28 LAB — HEMOGLOBIN A1C: Hgb A1c MFr Bld: 7.4 % — ABNORMAL HIGH (ref 4.6–6.5)

## 2018-03-28 LAB — BASIC METABOLIC PANEL
BUN: 19 mg/dL (ref 6–23)
CO2: 26 meq/L (ref 19–32)
Calcium: 9.8 mg/dL (ref 8.4–10.5)
Chloride: 101 mEq/L (ref 96–112)
Creatinine, Ser: 1.33 mg/dL (ref 0.40–1.50)
GFR: 63.81 mL/min (ref >60.00)
Glucose, Bld: 101 mg/dL — ABNORMAL HIGH (ref 70–99)
Potassium: 4.1 mEq/L (ref 3.5–5.1)
Sodium: 137 mEq/L (ref 135–145)

## 2018-03-28 NOTE — Assessment & Plan Note (Signed)
F/u w/Dr Sherrill 

## 2018-03-28 NOTE — Progress Notes (Signed)
Subjective:  Patient ID: Jon [name], male    DOB: 1945/08/06  Age: 73 y.o. MRN: [id number]  CC: No chief complaint on file.   HPI Jon [name] presents for L hand numbness off and on since 1985 - had a nerve block for anesthesia (L elbow bursa removal) -- ?complication  F/u HTN, dyslipidemia  Outpatient Medications Prior to Visit  Medication Sig Dispense Refill  . aspirin 325 MG EC tablet Take 325 mg by mouth daily.    . Cholecalciferol 1000 UNITS tablet Take 1,000 Units by mouth daily.     Marland Kitchen diltiazem (CARDIZEM CD) 240 MG 24 hr capsule Take 1 capsule (240 mg total) by mouth daily. 90 capsule 1  . fenofibrate micronized (LOFIBRA) 134 MG capsule TAKE ONE CAPSULE BY MOUTH DAILY BEFORE BREAKFAST. MUST SCHEDULE ANNUAL APPOINTMENT FOR MORE REFILLS 90 capsule 3  . metFORMIN (GLUCOPHAGE) 1000 MG tablet Take 1 tablet (1,000 mg total) by mouth 2 (two) times daily with a meal. 180 tablet 1  . Multiple Vitamin (MULTIVITAMIN WITH MINERALS) TABS tablet Take 1 tablet by mouth daily.    . nateglinide (STARLIX) 120 MG tablet Take 1 tablet (120 mg total) by mouth 3 (three) times daily with meals. 270 tablet 1  . omeprazole (PRILOSEC) 40 MG capsule Take 1 capsule (40 mg total) by mouth daily. 90 capsule 3   No facility-administered medications prior to visit.     ROS: Review of Systems  Constitutional: Negative for appetite change, fatigue and unexpected weight change.  HENT: Negative for congestion, nosebleeds, sneezing, sore throat and trouble swallowing.   Eyes: Negative for itching and visual disturbance.  Respiratory: Negative for cough.   Cardiovascular: Negative for chest pain, palpitations and leg swelling.  Gastrointestinal: Negative for abdominal distention, blood in stool, diarrhea and nausea.  Genitourinary: Negative for frequency and hematuria.  Musculoskeletal: Negative for back pain, gait problem, joint swelling and neck pain.  Skin: Negative for rash.  Neurological:  Positive for numbness. Negative for dizziness, tremors, speech difficulty and weakness.  Psychiatric/Behavioral: Negative for agitation, dysphoric mood and sleep disturbance. The patient is not nervous/anxious.     Objective:  BP 114/74 (BP Location: Left Arm, Patient Position: Sitting, Cuff Size: Large)   Pulse (!) 52   Temp 98.1 F (36.7 C) (Oral)   Ht 5\' 10"  (1.778 m)   Wt 225 lb (102.1 kg)   SpO2 94%   BMI 32.28 kg/m   BP Readings from Last 3 Encounters:  03/28/18 114/74  11/22/17 116/78  11/10/17 116/80    Wt Readings from Last 3 Encounters:  03/28/18 225 lb (102.1 kg)  11/22/17 223 lb (101.2 kg)  11/10/17 220 lb 6.4 oz (100 kg)    Physical Exam Constitutional:      General: He is not in acute distress.    Appearance: He is well-developed.     Comments: NAD  Eyes:     Conjunctiva/sclera: Conjunctivae normal.     Pupils: Pupils are equal, round, and reactive to light.  Neck:     Musculoskeletal: Normal range of motion.     Thyroid: No thyromegaly.     Vascular: No JVD.  Cardiovascular:     Rate and Rhythm: Normal rate and regular rhythm.     Heart sounds: Normal heart sounds. No murmur. No friction rub. No gallop.   Pulmonary:     Effort: Pulmonary effort is normal. No respiratory distress.     Breath sounds: Normal breath sounds. No wheezing or rales.  Chest:     Chest wall: No tenderness.  Abdominal:     General: Bowel sounds are normal. There is no distension.     Palpations: Abdomen is soft. There is no mass.     Tenderness: There is no abdominal tenderness. There is no guarding or rebound.  Musculoskeletal: Normal range of motion.        General: No tenderness.  Lymphadenopathy:     Cervical: No cervical adenopathy.  Skin:    General: Skin is warm and dry.     Findings: No rash.  Neurological:     Mental Status: He is alert and oriented to person, place, and time.     Cranial Nerves: No cranial nerve deficit.     Motor: No abnormal muscle tone.      Coordination: Coordination normal.     Gait: Gait normal.     Deep Tendon Reflexes: Reflexes are normal and symmetric.  Psychiatric:        Behavior: Behavior normal.        Thought Content: Thought content normal.        Judgment: Judgment normal.    CTS signs (-) B, good hand strength Axilla NT, no mass B  Lab Results  Component Value Date   WBC 8.3 11/22/2017   HGB 15.5 11/22/2017   HCT 45.5 11/22/2017   PLT 405.0 (H) 11/22/2017   GLUCOSE 132 (H) 11/22/2017   CHOL 159 12/15/2015   TRIG 222.0 (H) 12/15/2015   HDL 37.00 (L) 12/15/2015   LDLDIRECT 52.0 12/15/2015   LDLCALC 75 12/19/2014   ALT 24 11/22/2017   AST 22 11/22/2017   NA 138 11/22/2017   K 4.1 11/22/2017   CL 101 11/22/2017   CREATININE 1.36 11/22/2017   BUN 17 11/22/2017   CO2 28 11/22/2017   TSH 0.44 03/21/2017   PSA 1.43 12/15/2015   HGBA1C 7.2 (H) 07/19/2017    Dg Chest 2 View  Result Date: 11/23/2015 CLINICAL DATA:  46-year-old male with shortness of breath EXAM: CHEST  2 VIEW COMPARISON:  Chest radiograph dated 11/17/2014 FINDINGS: The heart size and mediastinal contours are within normal limits. Both lungs are clear. The visualized skeletal structures are unremarkable. IMPRESSION: No active cardiopulmonary disease. Electronically Signed   By: Anner Crete M.D.   On: 11/23/2015 22:41    Assessment & Plan:   There are no diagnoses linked to this encounter.   No orders of the defined types were placed in this encounter.    Follow-up: No follow-ups on file.  Walker Kehr, MD

## 2018-03-28 NOTE — Assessment & Plan Note (Signed)
Coronary artery calcifications on chest CT 2013 ASA, fenofibrate

## 2018-03-28 NOTE — Assessment & Plan Note (Signed)
L hand - worse L hand numbness off and on since 1985 - had a nerve block for anesthesia (L elbow bursa removal) -- ?complication

## 2018-03-28 NOTE — Assessment & Plan Note (Signed)
CBC

## 2018-03-28 NOTE — Assessment & Plan Note (Signed)
Cardizem  

## 2018-03-31 ENCOUNTER — Encounter: Payer: Self-pay | Admitting: Internal Medicine

## 2018-04-06 DIAGNOSIS — M79602 Pain in left arm: Secondary | ICD-10-CM | POA: Diagnosis not present

## 2018-04-06 DIAGNOSIS — R202 Paresthesia of skin: Secondary | ICD-10-CM | POA: Diagnosis not present

## 2018-04-06 DIAGNOSIS — G5602 Carpal tunnel syndrome, left upper limb: Secondary | ICD-10-CM | POA: Diagnosis not present

## 2018-04-17 ENCOUNTER — Encounter: Payer: Self-pay | Admitting: Internal Medicine

## 2018-04-23 DIAGNOSIS — Q141 Congenital malformation of retina: Secondary | ICD-10-CM | POA: Diagnosis not present

## 2018-04-23 DIAGNOSIS — E119 Type 2 diabetes mellitus without complications: Secondary | ICD-10-CM | POA: Diagnosis not present

## 2018-04-23 DIAGNOSIS — Z961 Presence of intraocular lens: Secondary | ICD-10-CM | POA: Diagnosis not present

## 2018-04-23 DIAGNOSIS — H40023 Open angle with borderline findings, high risk, bilateral: Secondary | ICD-10-CM | POA: Diagnosis not present

## 2018-04-23 LAB — HM DIABETES EYE EXAM

## 2018-05-10 ENCOUNTER — Ambulatory Visit (AMBULATORY_SURGERY_CENTER): Payer: Self-pay

## 2018-05-10 ENCOUNTER — Other Ambulatory Visit: Payer: Self-pay

## 2018-05-10 VITALS — Ht 71.0 in | Wt 218.0 lb

## 2018-05-10 DIAGNOSIS — Z86010 Personal history of colonic polyps: Secondary | ICD-10-CM

## 2018-05-10 MED ORDER — PEG-KCL-NACL-NASULF-NA ASC-C 140 G PO SOLR: 1.0000 | Freq: Once | ORAL | 0 refills | Status: AC

## 2018-05-10 NOTE — Progress Notes (Signed)
Denies allergies to eggs or soy products. Denies complication of anesthesia or sedation. Denies use of weight loss medication. Denies use of O2.   Emmi instructions given for colonoscopy.   Pre-Visit was conducted by phone due to Covid 19. Insurance number was verified. Patient is going to pick up a sample of Plenvu and his instruction package. Patient was encouraged to call us with any questions or concerns.

## 2018-05-23 ENCOUNTER — Encounter: Payer: Medicare Other | Admitting: Internal Medicine

## 2018-06-04 DIAGNOSIS — G5602 Carpal tunnel syndrome, left upper limb: Secondary | ICD-10-CM | POA: Insufficient documentation

## 2018-06-04 DIAGNOSIS — G5622 Lesion of ulnar nerve, left upper limb: Secondary | ICD-10-CM | POA: Diagnosis not present

## 2018-06-06 ENCOUNTER — Encounter: Payer: Medicare Other | Admitting: Internal Medicine

## 2018-06-06 ENCOUNTER — Encounter: Payer: Self-pay | Admitting: Internal Medicine

## 2018-06-08 DIAGNOSIS — G5602 Carpal tunnel syndrome, left upper limb: Secondary | ICD-10-CM | POA: Diagnosis not present

## 2018-06-14 ENCOUNTER — Telehealth: Payer: Self-pay | Admitting: Oncology

## 2018-06-14 NOTE — Telephone Encounter (Signed)
Tried to reach regarding reschedule °

## 2018-06-18 ENCOUNTER — Telehealth: Payer: Self-pay

## 2018-06-18 NOTE — Telephone Encounter (Signed)
Covid-19 travel screening questions  Have you traveled in the last 14 days?no If yes where?  Do you now or have you had a fever in the last 14 days?no  Do you have any respiratory symptoms of shortness of breath or cough now or in the last 14 days?no Do you have a medical history of Congestive Heart Failure?  Do you have a medical history of lung disease?  Do you have any family members or close contacts with diagnosed or suspected Covid-19?no  Pt aware of care partner policy and the need to wear a mask.

## 2018-06-20 ENCOUNTER — Other Ambulatory Visit: Payer: Self-pay

## 2018-06-20 ENCOUNTER — Ambulatory Visit (AMBULATORY_SURGERY_CENTER): Payer: Medicare Other | Admitting: Internal Medicine

## 2018-06-20 ENCOUNTER — Encounter: Payer: Self-pay | Admitting: Internal Medicine

## 2018-06-20 VITALS — BP 127/67 | HR 54 | Temp 98.4°F | Resp 15 | Ht 71.0 in | Wt 218.0 lb

## 2018-06-20 DIAGNOSIS — D124 Benign neoplasm of descending colon: Secondary | ICD-10-CM | POA: Diagnosis not present

## 2018-06-20 DIAGNOSIS — D123 Benign neoplasm of transverse colon: Secondary | ICD-10-CM | POA: Diagnosis not present

## 2018-06-20 DIAGNOSIS — Z860100 Personal history of colon polyps, unspecified: Secondary | ICD-10-CM

## 2018-06-20 MED ORDER — SODIUM CHLORIDE 0.9 % IV SOLN: 500.0000 mL | Freq: Once | INTRAVENOUS | Status: DC

## 2018-06-20 NOTE — Progress Notes (Signed)
Pt's states no medical or surgical changes since previsit or office visit. 

## 2018-06-20 NOTE — Op Note (Signed)
[facility] Patient Name: Jon [name] Procedure Date: 06/20/2018 11:33 AM MRN: [id number] Endoscopist: Docia Chuck. Henrene Pastor , MD Age: 73 Referring MD:  Date of Birth: 1945/02/04 Gender: Male Account #: [id number] Procedure:                Colonoscopy with cold snare polypectomy x 2 Indications:              High risk colon cancer surveillance: Personal                            history of multiple (3 or more) adenomas. Previous                            examinations 2004, 2010, 2015 Medicines:                Monitored Anesthesia Care Procedure:                Pre-Anesthesia Assessment:                           - Prior to the procedure, a History and Physical                            was performed, and patient medications and                            allergies were reviewed. The patient's tolerance of                            previous anesthesia was also reviewed. The risks                            and benefits of the procedure and the sedation                            options and risks were discussed with the patient.                            All questions were answered, and informed consent                            was obtained. Prior Anticoagulants: The patient has                            taken no previous anticoagulant or antiplatelet                            agents. ASA Grade Assessment: II - A patient with                            mild systemic disease. After reviewing the risks                            and benefits, the patient was deemed in  satisfactory condition to undergo the procedure.                           After obtaining informed consent, the colonoscope                            was passed under direct vision. Throughout the                            procedure, the patient's blood pressure, pulse, and                            oxygen saturations were monitored continuously. The   Colonoscope was introduced through the anus and                            advanced to the the cecum, identified by                            appendiceal orifice and ileocecal valve. The                            ileocecal valve, appendiceal orifice, and rectum                            were photographed. The quality of the bowel                            preparation was excellent. The colonoscopy was                            performed without difficulty. The patient tolerated                            the procedure well. The bowel preparation used was                            SUPREP via split dose instruction. Scope In: 11:39:37 AM Scope Out: 11:58:19 AM Scope Withdrawal Time: 0 hours 12 minutes 21 seconds  Total Procedure Duration: 0 hours 18 minutes 42 seconds  Findings:                 Two polyps were found in the descending colon and                            transverse colon. The polyps were 2 to 4 mm in                            size. These polyps were removed with a cold snare.                            Resection and retrieval were complete.  Diverticula were found in the sigmoid colon.                           Internal hemorrhoids were found during retroflexion.                           The exam was otherwise without abnormality on                            direct and retroflexion views. Complications:            No immediate complications. Estimated blood loss:                            None. Estimated Blood Loss:     Estimated blood loss: none. Impression:               - Two 2 to 4 mm polyps in the descending colon and                            in the transverse colon, removed with a cold snare.                            Resected and retrieved.                           - Diverticulosis in the sigmoid colon.                           - Internal hemorrhoids.                           - The examination was otherwise normal on direct                             and retroflexion views. Recommendation:           - Repeat colonoscopy in 5 years for surveillance.                           - Patient has a contact number available for                            emergencies. The signs and symptoms of potential                            delayed complications were discussed with the                            patient. Return to normal activities tomorrow.                            Written discharge instructions were provided to the                            patient.                           -  Resume previous diet.                           - Continue present medications.                           - Await pathology results. Docia Chuck. Henrene Pastor, MD 06/20/2018 12:04:56 PM This report has been signed electronically.

## 2018-06-20 NOTE — Patient Instructions (Signed)
YOU HAD AN ENDOSCOPIC PROCEDURE TODAY AT [facility]:   Refer to the procedure report that was given to you for any specific questions about what was found during the examination.  If the procedure report does not answer your questions, please call your gastroenterologist to clarify.  If you requested that your care partner not be given the details of your procedure findings, then the procedure report has been included in a sealed envelope for you to review at your convenience later.  YOU SHOULD EXPECT: Some feelings of bloating in the abdomen. Passage of more gas than usual.  Walking can help get rid of the air that was put into your GI tract during the procedure and reduce the bloating. If you had a lower endoscopy (such as a colonoscopy or flexible sigmoidoscopy) you may notice spotting of blood in your stool or on the toilet paper. If you underwent a bowel prep for your procedure, you may not have a normal bowel movement for a few days.  Please Note:  You might notice some irritation and congestion in your nose or some drainage.  This is from the oxygen used during your procedure.  There is no need for concern and it should clear up in a day or so.  SYMPTOMS TO REPORT IMMEDIATELY:   Following lower endoscopy (colonoscopy or flexible sigmoidoscopy):  Excessive amounts of blood in the stool  Significant tenderness or worsening of abdominal pains  Swelling of the abdomen that is new, acute  Fever of 100F or higher  Please see handouts given to you on Polyps, Diverticulosis and Hemorrhoids.   For urgent or emergent issues, a gastroenterologist can be reached at any hour by calling [phone].   DIET:  We do recommend a small meal at first, but then you may proceed to your regular diet.  Drink plenty of fluids but you should avoid alcoholic beverages for 24 hours.  ACTIVITY:  You should plan to take it easy for the rest of today and you should NOT DRIVE or use heavy  machinery until tomorrow (because of the sedation medicines used during the test).    FOLLOW UP: Our staff will call the number listed on your records 48-72 hours following your procedure to check on you and address any questions or concerns that you may have regarding the information given to you following your procedure. If we do not reach you, we will leave a message.  We will attempt to reach you two times.  During this call, we will ask if you have developed any symptoms of COVID 19. If you develop any symptoms (for example fever, flu-like symptoms, shortness of breath, cough etc.) before then, please call [phone].  If any biopsies were taken you will be contacted by phone or by letter within the next 1-3 weeks.  Please call us at [phone] if you have not heard about the biopsies in 3 weeks.    SIGNATURES/CONFIDENTIALITY: You and/or your care partner have signed paperwork which will be entered into your electronic medical record.  These signatures attest to the fact that that the information above on your After Visit Summary has been reviewed and is understood.  Full responsibility of the confidentiality of this discharge information lies with you and/or your care-partner.   Thank you for letting us take care of your healthcare needs today.

## 2018-06-20 NOTE — Progress Notes (Signed)
A/ox3, pleased with MAC, report to RN 

## 2018-06-20 NOTE — Progress Notes (Signed)
Called to room to assist during endoscopic procedure.  Patient ID and intended procedure confirmed with present staff. Received instructions for my participation in the procedure from the performing physician.  

## 2018-06-20 NOTE — Progress Notes (Signed)
Courtney Wshington took temp and Rica Mote took vitals.

## 2018-06-21 ENCOUNTER — Other Ambulatory Visit: Payer: Medicare Other

## 2018-06-21 ENCOUNTER — Ambulatory Visit: Payer: Medicare Other | Admitting: Oncology

## 2018-06-22 ENCOUNTER — Telehealth: Payer: Self-pay | Admitting: *Deleted

## 2018-06-22 ENCOUNTER — Encounter: Payer: Self-pay | Admitting: Internal Medicine

## 2018-06-22 NOTE — Telephone Encounter (Signed)
First follow up call attempt.  Reached non-identifying answering machine.  No message left. 

## 2018-06-22 NOTE — Telephone Encounter (Signed)
  Follow up Call-  Call back number 06/20/2018  Post procedure Call Back phone  # [phone]  Permission to leave phone message Yes  Some recent data might be hidden     Patient questions:  Do you have a fever, pain , or abdominal swelling? No. Pain Score  0 *  Have you tolerated food without any problems? Yes.    Have you been able to return to your normal activities? Yes.    Do you have any questions about your discharge instructions: Diet   No. Medications  No. Follow up visit  Yes.    Do you have questions or concerns about your Care? No.  Actions: * If pain score is 4 or above: No action needed, pain <4.  1. Have you developed a fever since your procedure? No   2.   Have you had an respiratory symptoms (SOB or cough) since your procedure? No  3.   Have you tested positive for COVID 19 since your procedure no  4.   Have you had any family members/close contacts diagnosed with the COVID 19 since your procedure?  no   If any of these questions are a yes, please inquire if patient has been seen by family doctor and route this note to Joylene John, Therapist, sports.

## 2018-07-31 ENCOUNTER — Encounter: Payer: Self-pay | Admitting: Internal Medicine

## 2018-07-31 ENCOUNTER — Other Ambulatory Visit (INDEPENDENT_AMBULATORY_CARE_PROVIDER_SITE_OTHER): Payer: Medicare Other

## 2018-07-31 ENCOUNTER — Ambulatory Visit (INDEPENDENT_AMBULATORY_CARE_PROVIDER_SITE_OTHER): Payer: Medicare Other | Admitting: Internal Medicine

## 2018-07-31 ENCOUNTER — Other Ambulatory Visit: Payer: Self-pay

## 2018-07-31 VITALS — BP 120/74 | HR 55 | Temp 97.5°F | Ht 71.0 in | Wt 218.0 lb

## 2018-07-31 DIAGNOSIS — D751 Secondary polycythemia: Secondary | ICD-10-CM

## 2018-07-31 DIAGNOSIS — I10 Essential (primary) hypertension: Secondary | ICD-10-CM | POA: Diagnosis not present

## 2018-07-31 DIAGNOSIS — E119 Type 2 diabetes mellitus without complications: Secondary | ICD-10-CM

## 2018-07-31 DIAGNOSIS — I480 Paroxysmal atrial fibrillation: Secondary | ICD-10-CM

## 2018-07-31 DIAGNOSIS — Z0000 Encounter for general adult medical examination without abnormal findings: Secondary | ICD-10-CM

## 2018-07-31 DIAGNOSIS — I2510 Atherosclerotic heart disease of native coronary artery without angina pectoris: Secondary | ICD-10-CM | POA: Diagnosis not present

## 2018-07-31 DIAGNOSIS — Z125 Encounter for screening for malignant neoplasm of prostate: Secondary | ICD-10-CM

## 2018-07-31 DIAGNOSIS — I2583 Coronary atherosclerosis due to lipid rich plaque: Secondary | ICD-10-CM

## 2018-07-31 LAB — PSA: PSA: 1.67 ng/mL (ref 0.10–4.00)

## 2018-07-31 LAB — HEPATIC FUNCTION PANEL
ALT: 21 U/L (ref 0–53)
AST: 23 U/L (ref 0–37)
Albumin: 4.9 g/dL (ref 3.5–5.2)
Alkaline Phosphatase: 55 U/L (ref 39–117)
Bilirubin, Direct: 0.1 mg/dL (ref 0.0–0.3)
Total Bilirubin: 0.4 mg/dL (ref 0.2–1.2)
Total Protein: 7.4 g/dL (ref 6.0–8.3)

## 2018-07-31 LAB — URINALYSIS
Bilirubin Urine: NEGATIVE
Hgb urine dipstick: NEGATIVE
Ketones, ur: NEGATIVE
Leukocytes,Ua: NEGATIVE
Nitrite: NEGATIVE
Specific Gravity, Urine: 1.015 (ref 1.000–1.030)
Total Protein, Urine: NEGATIVE
Urine Glucose: NEGATIVE
Urobilinogen, UA: 0.2 (ref 0.0–1.0)
pH: 7 (ref 5.0–8.0)

## 2018-07-31 LAB — CBC WITH DIFFERENTIAL/PLATELET
Basophils Absolute: 0.1 10*3/uL (ref 0.0–0.1)
Basophils Relative: 0.9 % (ref 0.0–3.0)
Eosinophils Absolute: 0.3 10*3/uL (ref 0.0–0.7)
Eosinophils Relative: 4.1 % (ref 0.0–5.0)
HCT: 46.4 % (ref 39.0–52.0)
Hemoglobin: 15.3 g/dL (ref 13.0–17.0)
Lymphocytes Relative: 29.1 % (ref 12.0–46.0)
Lymphs Abs: 2.3 10*3/uL (ref 0.7–4.0)
MCHC: 33 g/dL (ref 30.0–36.0)
MCV: 86.5 fl (ref 78.0–100.0)
Monocytes Absolute: 1 10*3/uL (ref 0.1–1.0)
Monocytes Relative: 12.6 % — ABNORMAL HIGH (ref 3.0–12.0)
Neutro Abs: 4.1 10*3/uL (ref 1.4–7.7)
Neutrophils Relative %: 53.3 % (ref 43.0–77.0)
Platelets: 382 10*3/uL (ref 150.0–400.0)
RBC: 5.37 Mil/uL (ref 4.22–5.81)
RDW: 15.6 % — ABNORMAL HIGH (ref 11.5–15.5)
WBC: 7.8 10*3/uL (ref 4.0–10.5)

## 2018-07-31 LAB — BASIC METABOLIC PANEL
BUN: 21 mg/dL (ref 6–23)
CO2: 28 mEq/L (ref 19–32)
Calcium: 10 mg/dL (ref 8.4–10.5)
Chloride: 100 mEq/L (ref 96–112)
Creatinine, Ser: 1.32 mg/dL (ref 0.40–1.50)
GFR: 64.31 mL/min (ref >60.00)
Glucose, Bld: 98 mg/dL (ref 70–99)
Potassium: 4.5 mEq/L (ref 3.5–5.1)
Sodium: 139 mEq/L (ref 135–145)

## 2018-07-31 LAB — HEMOGLOBIN A1C: Hgb A1c MFr Bld: 7 % — ABNORMAL HIGH (ref 4.6–6.5)

## 2018-07-31 LAB — TSH: TSH: 0.69 u[IU]/mL (ref 0.35–4.50)

## 2018-07-31 NOTE — Assessment & Plan Note (Signed)
Here for medicare wellness/physical  Diet: heart healthy  Physical activity: not sedentary  Depression/mood screen: negative  Hearing: intact to whispered voice  Visual acuity: grossly normal w/glasses, performs annual eye exam  ADLs: capable  Fall risk: none  Home safety: good  Cognitive evaluation: intact to orientation, naming, recall and repetition  EOL planning: adv directives, full code/ I agree  I have personally reviewed and have noted  1. The patient's medical, surgical and social history  2. Their use of alcohol, tobacco or illicit drugs  3. Their current medications and supplements  4. The patient's functional ability including ADL's, fall risks, home safety risks and hearing or visual impairment.  5. Diet and physical activities  6. Evidence for depression or mood disorders 7. The roster of all physicians providing medical care to patient - is listed in the Snapshot section of the chart and reviewed today.    Today patient counseled on age appropriate routine health concerns for screening and prevention, each reviewed and up to date or declined. Immunizations reviewed and up to date or declined. Labs ordered and reviewed. Risk factors for depression reviewed and negative. Hearing function and visual acuity are intact. ADLs screened and addressed as needed. Functional ability and level of safety reviewed and appropriate. Education, counseling and referrals performed based on assessed risks today. Patient provided with a copy of personalized plan for preventive services.

## 2018-07-31 NOTE — Assessment & Plan Note (Signed)
CPAP.  

## 2018-07-31 NOTE — Progress Notes (Signed)
Subjective:  Patient ID: Jon [name], male    DOB: 12-22-1945  Age: 73 y.o. MRN: [id number]  CC: No chief complaint on file.   HPI Jon [name] presents for a well exam  Outpatient Medications Prior to Visit  Medication Sig Dispense Refill  . aspirin 325 MG EC tablet Take 325 mg by mouth daily.    . Cholecalciferol 1000 UNITS tablet Take 1,000 Units by mouth daily.     Marland Kitchen diltiazem (CARDIZEM CD) 240 MG 24 hr capsule Take 1 capsule (240 mg total) by mouth daily. 90 capsule 1  . fenofibrate micronized (LOFIBRA) 134 MG capsule TAKE ONE CAPSULE BY MOUTH DAILY BEFORE BREAKFAST. MUST SCHEDULE ANNUAL APPOINTMENT FOR MORE REFILLS 90 capsule 3  . metFORMIN (GLUCOPHAGE) 1000 MG tablet Take 1 tablet (1,000 mg total) by mouth 2 (two) times daily with a meal. 180 tablet 1  . Multiple Vitamin (MULTIVITAMIN WITH MINERALS) TABS tablet Take 1 tablet by mouth daily.    . nateglinide (STARLIX) 120 MG tablet Take 1 tablet (120 mg total) by mouth 3 (three) times daily with meals. 270 tablet 1  . omeprazole (PRILOSEC) 40 MG capsule Take 1 capsule (40 mg total) by mouth daily. 90 capsule 3   Facility-Administered Medications Prior to Visit  Medication Dose Route Frequency Provider Last Rate Last Dose  . 0.9 %  sodium chloride infusion  500 mL Intravenous Once Irene Shipper, MD        ROS: Review of Systems  Constitutional: Negative for appetite change, fatigue and unexpected weight change.  HENT: Negative for congestion, nosebleeds, sneezing, sore throat and trouble swallowing.   Eyes: Negative for itching and visual disturbance.  Respiratory: Negative for cough.   Cardiovascular: Negative for chest pain, palpitations and leg swelling.  Gastrointestinal: Negative for abdominal distention, blood in stool, diarrhea and nausea.  Genitourinary: Negative for frequency and hematuria.  Musculoskeletal: Negative for back pain, gait problem, joint swelling and neck pain.  Skin: Negative for rash.   Neurological: Negative for dizziness, tremors, speech difficulty and weakness.  Psychiatric/Behavioral: Negative for agitation, dysphoric mood, sleep disturbance and suicidal ideas. The patient is not nervous/anxious.     Objective:  BP 120/74 (BP Location: Left Arm, Patient Position: Sitting, Cuff Size: Large)   Pulse (!) 55   Temp (!) 97.5 F (36.4 C) (Oral)   Ht 5\' 11"  (1.803 m)   Wt 218 lb (98.9 kg)   SpO2 98%   BMI 30.40 kg/m   BP Readings from Last 3 Encounters:  07/31/18 120/74  06/20/18 127/67  03/28/18 114/74    Wt Readings from Last 3 Encounters:  07/31/18 218 lb (98.9 kg)  06/20/18 218 lb (98.9 kg)  05/10/18 218 lb (98.9 kg)    Physical Exam Constitutional:      General: He is not in acute distress.    Appearance: He is well-developed.     Comments: NAD  Eyes:     Conjunctiva/sclera: Conjunctivae normal.     Pupils: Pupils are equal, round, and reactive to light.  Neck:     Musculoskeletal: Normal range of motion.     Thyroid: No thyromegaly.     Vascular: No JVD.  Cardiovascular:     Rate and Rhythm: Normal rate and regular rhythm.     Heart sounds: Normal heart sounds. No murmur. No friction rub. No gallop.   Pulmonary:     Effort: Pulmonary effort is normal. No respiratory distress.     Breath sounds: Normal breath  sounds. No wheezing or rales.  Chest:     Chest wall: No tenderness.  Abdominal:     General: Bowel sounds are normal. There is no distension.     Palpations: Abdomen is soft. There is no mass.     Tenderness: There is no abdominal tenderness. There is no guarding or rebound.  Musculoskeletal: Normal range of motion.        General: No tenderness.  Lymphadenopathy:     Cervical: No cervical adenopathy.  Skin:    General: Skin is warm and dry.     Findings: No rash.  Neurological:     Mental Status: He is alert and oriented to person, place, and time.     Cranial Nerves: No cranial nerve deficit.     Motor: No abnormal muscle tone.      Coordination: Coordination normal.     Gait: Gait normal.     Deep Tendon Reflexes: Reflexes are normal and symmetric.  Psychiatric:        Behavior: Behavior normal.        Thought Content: Thought content normal.        Judgment: Judgment normal.   rectal- per GI  Lab Results  Component Value Date   WBC 8.3 11/22/2017   HGB 15.5 11/22/2017   HCT 45.5 11/22/2017   PLT 405.0 (H) 11/22/2017   GLUCOSE 101 (H) 03/28/2018   CHOL 159 12/15/2015   TRIG 222.0 (H) 12/15/2015   HDL 37.00 (L) 12/15/2015   LDLDIRECT 52.0 12/15/2015   LDLCALC 75 12/19/2014   ALT 24 11/22/2017   AST 22 11/22/2017   NA 137 03/28/2018   K 4.1 03/28/2018   CL 101 03/28/2018   CREATININE 1.33 03/28/2018   BUN 19 03/28/2018   CO2 26 03/28/2018   TSH 0.44 03/21/2017   PSA 1.43 12/15/2015   HGBA1C 7.4 (H) 03/28/2018    Dg Chest 2 View  Result Date: 11/23/2015 CLINICAL DATA:  40-year-old male with shortness of breath EXAM: CHEST  2 VIEW COMPARISON:  Chest radiograph dated 11/17/2014 FINDINGS: The heart size and mediastinal contours are within normal limits. Both lungs are clear. The visualized skeletal structures are unremarkable. IMPRESSION: No active cardiopulmonary disease. Electronically Signed   By: Anner Crete M.D.   On: 11/23/2015 22:41    Assessment & Plan:   There are no diagnoses linked to this encounter.   No orders of the defined types were placed in this encounter.    Follow-up: No follow-ups on file.  Walker Kehr, MD

## 2018-07-31 NOTE — Assessment & Plan Note (Signed)
Labs

## 2018-07-31 NOTE — Assessment & Plan Note (Signed)
ASA, fenofibrate

## 2018-07-31 NOTE — Assessment & Plan Note (Addendum)
Cardizem CD, ASA

## 2018-08-01 ENCOUNTER — Other Ambulatory Visit: Payer: Self-pay

## 2018-08-01 DIAGNOSIS — C169 Malignant neoplasm of stomach, unspecified: Secondary | ICD-10-CM

## 2018-08-02 ENCOUNTER — Other Ambulatory Visit: Payer: Self-pay

## 2018-08-02 ENCOUNTER — Inpatient Hospital Stay: Payer: Medicare Other | Attending: Oncology

## 2018-08-02 ENCOUNTER — Inpatient Hospital Stay (HOSPITAL_BASED_OUTPATIENT_CLINIC_OR_DEPARTMENT_OTHER): Payer: Medicare Other | Admitting: Oncology

## 2018-08-02 VITALS — BP 126/85 | HR 58 | Temp 98.2°F | Resp 18 | Ht 71.0 in | Wt 217.8 lb

## 2018-08-02 DIAGNOSIS — G4730 Sleep apnea, unspecified: Secondary | ICD-10-CM

## 2018-08-02 DIAGNOSIS — Z9221 Personal history of antineoplastic chemotherapy: Secondary | ICD-10-CM | POA: Diagnosis not present

## 2018-08-02 DIAGNOSIS — C169 Malignant neoplasm of stomach, unspecified: Secondary | ICD-10-CM

## 2018-08-02 DIAGNOSIS — E119 Type 2 diabetes mellitus without complications: Secondary | ICD-10-CM | POA: Insufficient documentation

## 2018-08-02 DIAGNOSIS — D123 Benign neoplasm of transverse colon: Secondary | ICD-10-CM

## 2018-08-02 DIAGNOSIS — Z85028 Personal history of other malignant neoplasm of stomach: Secondary | ICD-10-CM

## 2018-08-02 DIAGNOSIS — E785 Hyperlipidemia, unspecified: Secondary | ICD-10-CM

## 2018-08-02 DIAGNOSIS — Z8619 Personal history of other infectious and parasitic diseases: Secondary | ICD-10-CM | POA: Insufficient documentation

## 2018-08-02 DIAGNOSIS — D124 Benign neoplasm of descending colon: Secondary | ICD-10-CM | POA: Insufficient documentation

## 2018-08-02 DIAGNOSIS — D225 Melanocytic nevi of trunk: Secondary | ICD-10-CM

## 2018-08-02 DIAGNOSIS — Z923 Personal history of irradiation: Secondary | ICD-10-CM | POA: Insufficient documentation

## 2018-08-02 LAB — CEA (IN HOUSE-CHCC): CEA (CHCC-In House): 1.1 ng/mL (ref 0.00–5.00)

## 2018-08-02 NOTE — Progress Notes (Signed)
Jon OFFICE PROGRESS NOTE   Diagnosis: Gastroesophageal cancer  INTERVAL HISTORY:   Jon [name] returns for scheduled visit.  He feels well.  No dysphasia.  No complaint.  Objective:  Vital signs in last 24 hours:  Blood pressure (!) 127/94, pulse (!) 58, temperature 98.2 F (36.8 C), temperature source Oral, resp. rate 18, height 5\' 11"  (1.803 m), weight 217 lb 12.8 oz (98.8 kg), SpO2 100 %.    HEENT: Neck without mass Lymphatics: No cervical, supraclavicular, axillary, or inguinal nodes GI: No hepatosplenomegaly, no mass, nontender Vascular: No leg edema  Skin: Hyperpigmented mole at the right upper back    Lab Results:  Lab Results  Component Value Date   WBC 7.8 07/31/2018   HGB 15.3 07/31/2018   HCT 46.4 07/31/2018   MCV 86.5 07/31/2018   PLT 382.0 07/31/2018   NEUTROABS 4.1 07/31/2018    CMP  Lab Results  Component Value Date   NA 139 07/31/2018   K 4.5 07/31/2018   CL 100 07/31/2018   CO2 28 07/31/2018   GLUCOSE 98 07/31/2018   BUN 21 07/31/2018   CREATININE 1.32 07/31/2018   CALCIUM 10.0 07/31/2018   PROT 7.4 07/31/2018   ALBUMIN 4.9 07/31/2018   AST 23 07/31/2018   ALT 21 07/31/2018   ALKPHOS 55 07/31/2018   BILITOT 0.4 07/31/2018   GFRNONAA 45 (L) 11/23/2015   GFRAA 52 (L) 11/23/2015    Lab Results  Component Value Date   CEA1 1.17 06/20/2017     Medications: I have reviewed the patient's current medications.   Assessment/Plan:  1. 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 Staging CT scan 08/27/2010 revealed small gastrohepatic ligament, periportal and retrocrural lymph nodes without other evidence of metastatic disease. Staging PET scan on 09/21/2010 showed abnormal malignant range FDG uptake at the site of the biopsy-proven gastric cancer. Small perigastric lymph nodes were not FDG avid above the background level. The  radiologist commented that this may reflect a benign etiology or a false-negative appearance due to the small lymph node size. There was no evidence for distant metastatic disease. The CEA was markedly elevated at 482 on 09/14/2010. He completed radiation 09/20/2010 through 10/29/2010. He received concurrent weekly Taxol/carboplatin chemotherapy 09/22/2010 through 10/27/2010. Restaging PET scan 11/22/2010 showed interval resolution of previously demonstrated hypermetabolic activity within the proximal stomach. There was no evidence of metastatic disease. Restaging PET scan 05/10/2011 showed no evidence of gastroesophageal carcinoma recurrence or metastasis. 2. Status post upper endoscopy 12/17/2010-the very distal portion of the esophagus was ulcerated. The proximal stomach revealed a malignant appearing ulcerative mass in the region of the cardia/gastroesophageal junction. The overall bulk of the lesion was significantly improved but still quite abnormal and large. Multiple biopsies were taken. Pathology was negative for malignancy. 3. Elevated CEA (482) on 09/14/2010.  4. Dysphagia secondary to the lower esophagus/upper gastric tumor, resolved. 5. Microcytic anemia. The hemoglobin was normal on 03/31/2011. There was persistent red cell microcytosis. Hemoglobin and MCV are normal 6. Sleep apnea. 7. Early diabetes. 8. History of atrial fibrillation. 9. Hyperlipidemia. 10. History of colon polyps. 11. History of Helicobacter pylori infection in 2011. 12. History of neutropenia secondary to chemotherapy, resolved. 13. CT of the chest on 02/15/2011 with left lower lung density/air bronchograms-? Radiation change. 14. Colonoscopy 03/27/2013. Mild melanosis found throughout the entire examined colon. 2 diminutive polyps found in the descending colon (Tubular adenoma. No high-grade dysplasia or malignancy  noted). Moderate diverticulosis in the left colon. Followup colonoscopy 06/20/2018- polyps removed from  the descending and transverse colon, tubular adenomas 15. Nodular hyperpigmented mole at the right upper back . He reports the mole is unchanged and has been evaluated.     Disposition: Jon [name] remains in clinical remission from gastroesophageal cancer.  He is now 8 years out from diagnosis.  We will follow-up on the CEA from today.  He would like to continue follow-up at the Cancer center.  He will return for an office visit in 1 year.  Betsy Coder, MD  08/02/2018  9:39 AM

## 2018-08-06 ENCOUNTER — Telehealth: Payer: Self-pay

## 2018-08-06 ENCOUNTER — Telehealth: Payer: Self-pay | Admitting: Oncology

## 2018-08-06 NOTE — Telephone Encounter (Signed)
-----   Message from Ladell Pier, MD sent at 08/02/2018  3:30 PM EDT ----- Please call patient, CEA is normal

## 2018-08-06 NOTE — Telephone Encounter (Signed)
Spoke with pt advised per Dr Benay Spice CEA is normal. Pt verbalized understanding.

## 2018-08-06 NOTE — Telephone Encounter (Signed)
Scheduled per los. Mailed schedule

## 2018-09-06 ENCOUNTER — Other Ambulatory Visit: Payer: Self-pay | Admitting: Internal Medicine

## 2018-10-06 ENCOUNTER — Other Ambulatory Visit: Payer: Self-pay | Admitting: Internal Medicine

## 2018-10-23 DIAGNOSIS — H40023 Open angle with borderline findings, high risk, bilateral: Secondary | ICD-10-CM | POA: Diagnosis not present

## 2018-10-30 ENCOUNTER — Other Ambulatory Visit: Payer: Self-pay

## 2018-10-30 ENCOUNTER — Ambulatory Visit (INDEPENDENT_AMBULATORY_CARE_PROVIDER_SITE_OTHER): Payer: Medicare Other | Admitting: Internal Medicine

## 2018-10-30 ENCOUNTER — Encounter: Payer: Self-pay | Admitting: Internal Medicine

## 2018-10-30 DIAGNOSIS — R600 Localized edema: Secondary | ICD-10-CM | POA: Diagnosis not present

## 2018-10-30 MED ORDER — NAPROXEN 500 MG PO TABS: 500.0000 mg | ORAL_TABLET | Freq: Two times a day (BID) | ORAL | 0 refills | Status: DC

## 2018-10-30 MED ORDER — NAPROXEN 500 MG PO TABS: 500.0000 mg | ORAL_TABLET | Freq: Two times a day (BID) | ORAL | 2 refills | Status: DC

## 2018-10-30 NOTE — Progress Notes (Signed)
Subjective:  Patient ID: Jon [name], male    DOB: Dec 12, 1945  Age: 73 y.o. MRN: [id number]  CC: No chief complaint on file.   HPI FERMAN UMBERGER presents for R leg and R knee swelling x 1 week. He was working on the garage roof 1200 sq ft. No injury, but up and down scaffolds   Outpatient Medications Prior to Visit  Medication Sig Dispense Refill  . aspirin 325 MG EC tablet Take 325 mg by mouth daily.    Marland Kitchen CARTIA XT 240 MG 24 hr capsule TAKE 1 CAPSULE(240 MG) BY MOUTH DAILY 90 capsule 1  . Cholecalciferol 1000 UNITS tablet Take 1,000 Units by mouth daily.     . fenofibrate micronized (LOFIBRA) 134 MG capsule TAKE ONE CAPSULE BY MOUTH DAILY BEFORE BREAKFAST. MUST SCHEDULE ANNUAL APPOINTMENT FOR MORE REFILLS 90 capsule 3  . metFORMIN (GLUCOPHAGE) 1000 MG tablet TAKE 1 TABLET(1000 MG) BY MOUTH TWICE DAILY WITH A MEAL 180 tablet 1  . Multiple Vitamin (MULTIVITAMIN WITH MINERALS) TABS tablet Take 1 tablet by mouth daily.    . nateglinide (STARLIX) 120 MG tablet TAKE 1 TABLET(120 MG) BY MOUTH THREE TIMES DAILY WITH MEALS 270 tablet 1  . omeprazole (PRILOSEC) 40 MG capsule TAKE 1 CAPSULE BY MOUTH DAILY 90 capsule 3   Facility-Administered Medications Prior to Visit  Medication Dose Route Frequency Provider Last Rate Last Dose  . 0.9 %  sodium chloride infusion  500 mL Intravenous Once Irene Shipper, MD        ROS: Review of Systems  Constitutional: Negative for appetite change, fatigue and unexpected weight change.  HENT: Negative for congestion, nosebleeds, sneezing, sore throat and trouble swallowing.   Eyes: Negative for itching and visual disturbance.  Respiratory: Negative for cough.   Cardiovascular: Positive for leg swelling. Negative for chest pain and palpitations.  Gastrointestinal: Negative for abdominal distention, blood in stool, diarrhea and nausea.  Genitourinary: Negative for frequency and hematuria.  Musculoskeletal: Negative for back pain, gait problem, joint  swelling and neck pain.  Skin: Negative for rash.  Neurological: Negative for dizziness, tremors, speech difficulty and weakness.  Psychiatric/Behavioral: Negative for agitation, dysphoric mood and sleep disturbance. The patient is not nervous/anxious.     Objective:  BP 128/76 (BP Location: Left Arm, Patient Position: Sitting, Cuff Size: Large)   Pulse 65   Temp 98.7 F (37.1 C) (Oral)   Ht 5\' 11"  (1.803 m)   Wt 214 lb (97.1 kg)   SpO2 97%   BMI 29.85 kg/m   BP Readings from Last 3 Encounters:  10/30/18 128/76  08/02/18 126/85  07/31/18 120/74    Wt Readings from Last 3 Encounters:  10/30/18 214 lb (97.1 kg)  08/02/18 217 lb 12.8 oz (98.8 kg)  07/31/18 218 lb (98.9 kg)    Physical Exam Constitutional:      General: He is not in acute distress.    Appearance: He is well-developed.     Comments: NAD  Eyes:     Conjunctiva/sclera: Conjunctivae normal.     Pupils: Pupils are equal, round, and reactive to light.  Neck:     Musculoskeletal: Normal range of motion.     Thyroid: No thyromegaly.     Vascular: No JVD.  Cardiovascular:     Rate and Rhythm: Normal rate and regular rhythm.     Heart sounds: Normal heart sounds. No murmur. No friction rub. No gallop.   Pulmonary:     Effort: Pulmonary effort is normal.  No respiratory distress.     Breath sounds: Normal breath sounds. No wheezing or rales.  Chest:     Chest wall: No tenderness.  Abdominal:     General: Bowel sounds are normal. There is no distension.     Palpations: Abdomen is soft. There is no mass.     Tenderness: There is no abdominal tenderness. There is no guarding or rebound.  Musculoskeletal: Normal range of motion.        General: No tenderness.     Right lower leg: Edema present.     Left lower leg: No edema.  Lymphadenopathy:     Cervical: No cervical adenopathy.  Skin:    General: Skin is warm and dry.     Findings: No rash.  Neurological:     Mental Status: He is alert and oriented to  person, place, and time.     Cranial Nerves: No cranial nerve deficit.     Motor: No abnormal muscle tone.     Coordination: Coordination normal.     Gait: Gait normal.     Deep Tendon Reflexes: Reflexes are normal and symmetric.  Psychiatric:        Behavior: Behavior normal.        Thought Content: Thought content normal.        Judgment: Judgment normal.   lower R leg edema Calves NT B Knees NT R knee w/trace fluid, NT  Lab Results  Component Value Date   WBC 7.8 07/31/2018   HGB 15.3 07/31/2018   HCT 46.4 07/31/2018   PLT 382.0 07/31/2018   GLUCOSE 98 07/31/2018   CHOL 159 12/15/2015   TRIG 222.0 (H) 12/15/2015   HDL 37.00 (L) 12/15/2015   LDLDIRECT 52.0 12/15/2015   LDLCALC 75 12/19/2014   ALT 21 07/31/2018   AST 23 07/31/2018   NA 139 07/31/2018   K 4.5 07/31/2018   CL 100 07/31/2018   CREATININE 1.32 07/31/2018   BUN 21 07/31/2018   CO2 28 07/31/2018   TSH 0.69 07/31/2018   PSA 1.67 07/31/2018   HGBA1C 7.0 (H) 07/31/2018    Dg Chest 2 View  Result Date: 11/23/2015 CLINICAL DATA:  41-year-old male with shortness of breath EXAM: CHEST  2 VIEW COMPARISON:  Chest radiograph dated 11/17/2014 FINDINGS: The heart size and mediastinal contours are within normal limits. Both lungs are clear. The visualized skeletal structures are unremarkable. IMPRESSION: No active cardiopulmonary disease. Electronically Signed   By: Anner Crete M.D.   On: 11/23/2015 22:41    Assessment & Plan:   There are no diagnoses linked to this encounter.   No orders of the defined types were placed in this encounter.    Follow-up: No follow-ups on file.  Walker Kehr, MD

## 2018-10-30 NOTE — Assessment & Plan Note (Signed)
9/20 x1 week ?etiology R/o DVT, baker cyst Naproxen po [place] ven Doppler

## 2018-10-31 ENCOUNTER — Ambulatory Visit (HOSPITAL_COMMUNITY)
Admission: RE | Admit: 2018-10-31 | Discharge: 2018-10-31 | Disposition: A | Discharge status: Home / Self Care | Payer: Medicare Other | Source: Ambulatory Visit | Attending: Cardiovascular Disease | Admitting: Cardiovascular Disease

## 2018-10-31 DIAGNOSIS — R600 Localized edema: Secondary | ICD-10-CM | POA: Diagnosis not present

## 2018-11-12 ENCOUNTER — Encounter: Payer: Self-pay | Admitting: Adult Health

## 2018-11-12 ENCOUNTER — Ambulatory Visit: Payer: Medicare Other | Admitting: Adult Health

## 2018-11-12 ENCOUNTER — Other Ambulatory Visit: Payer: Self-pay

## 2018-11-12 DIAGNOSIS — G4733 Obstructive sleep apnea (adult) (pediatric): Secondary | ICD-10-CM

## 2018-11-12 NOTE — Addendum Note (Signed)
Addended by: Parke Poisson E on: 11/12/2018 10:22 AM   Modules accepted: Orders

## 2018-11-12 NOTE — Assessment & Plan Note (Signed)
Severe OSA -well controlled on CPAP  Needs new CPAP .   Plan  Patient Instructions  Keep up the good work. Order for new CPAP machine.  Continue on C Pap at bedtime Do not drive if sleepy Remain active, healthy weight.  Follow up with Dr. Elsworth Soho  In 1 year and As needed

## 2018-11-12 NOTE — Patient Instructions (Addendum)
Keep up the good work. Order for new CPAP machine.  Continue on C Pap at bedtime Do not drive if sleepy Remain active, healthy weight.  Follow up with Dr. Elsworth Soho  In 1 year and As needed

## 2018-11-12 NOTE — Progress Notes (Signed)
@Patient  ID: Jon [name], male    DOB: 1945-10-24, 73 y.o.   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  Patient has underlying severe obstructive sleep apnea.  He is on nocturnal CPAP.  Says he does doing very well.  He wears his CPAP each night.  Never misses a night.  Work gets in about 7 hours of sleep.  He denies any daytime sleepiness.  Feels that he benefits from CPAP. Patient CPAP machine is greater than 54 years old.  Unable to get CPAP downloads. Says he is very active.  Says he never sits around.  Is currently put no roof on his house. Declines flu shot.    No Known Allergies  Immunization History  Administered Date(s) Administered   Pneumococcal Conjugate-13 12/18/2012   Pneumococcal Polysaccharide-23 03/15/2011, 03/21/2017   Td 03/18/2004, 06/18/2014   Zoster 12/31/2009   Zoster Recombinat (Shingrix) 01/30/2017, 04/05/2017    Past Medical History:  Diagnosis Date   Adenomatous colon polyp    Anemia    Atrial fibrillation ([place])    Cataract    Diabetes mellitus without complication ([place])    Diverticulosis    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[place]) 2012   Gastroesophageal /radiation Tx and Chemo   GERD (gastroesophageal reflux disease)    Glaucoma    H. pylori infection 2011   Heart murmur    History of radiation therapy 09/20/10 thru 10/29/10   gastroesophageal junction/gastric cardia   Hypertension    Neuromuscular disorder ([place])    Other and unspecified hyperlipidemia    Sleep apnea    On CPAP   Status post chemotherapy 09/22/10  thru 10/27/10   concurrent w/radiation   Unspecified sleep apnea     Tobacco History: Social History   Tobacco Use  Smoking Status Former Smoker   Packs/day: 1.00   Years: 40.00   Pack years: 40.00   Types: Cigarettes   Quit date: 01/31/2005   Years since quitting: 13.7  Smokeless Tobacco Never Used   Counseling given: Not Answered   Outpatient Medications Prior to Visit  Medication Sig Dispense Refill   aspirin 325 MG EC tablet Take 325 mg by mouth daily.     CARTIA XT 240 MG 24 hr capsule TAKE 1 CAPSULE(240 MG) BY MOUTH DAILY 90 capsule 1   Cholecalciferol 1000 UNITS tablet Take 1,000 Units by mouth daily.      fenofibrate micronized (LOFIBRA) 134 MG capsule TAKE ONE CAPSULE BY MOUTH DAILY BEFORE BREAKFAST. MUST SCHEDULE ANNUAL APPOINTMENT FOR MORE REFILLS 90 capsule 3   metFORMIN (GLUCOPHAGE) 1000 MG tablet TAKE 1 TABLET(1000 MG) BY MOUTH TWICE DAILY WITH A MEAL 180 tablet 1   Multiple Vitamin (MULTIVITAMIN WITH MINERALS) TABS tablet Take 1 tablet by mouth daily.     naproxen (NAPROSYN) 500 MG tablet Take 1 tablet (500 mg total) by mouth 2 (two) times daily with a meal. 30 tablet 0   nateglinide (STARLIX) 120 MG tablet TAKE 1 TABLET(120 MG) BY MOUTH THREE TIMES DAILY WITH MEALS 270 tablet 1   omeprazole (PRILOSEC) 40 MG capsule TAKE 1 CAPSULE BY MOUTH DAILY  90 capsule 3   Facility-Administered Medications Prior to Visit  Medication Dose Route Frequency Provider Last Rate Last Dose   0.9 %  sodium chloride infusion  500 mL Intravenous Once Irene Shipper, MD         Review of Systems:   Constitutional:   No  weight loss, night sweats,  Fevers, chills, fatigue, or  lassitude.  HEENT:   No headaches,  Difficulty swallowing,  Tooth/dental problems, or  Sore throat,                No sneezing, itching, ear ache, nasal congestion, post nasal drip,   CV:  No chest pain,  Orthopnea, PND, swelling in lower extremities, anasarca, dizziness, palpitations, syncope.     GI  No heartburn, indigestion, abdominal pain, nausea, vomiting, diarrhea, change in bowel habits, loss of appetite, bloody stools.   Resp: No shortness of breath with exertion or at rest.  No excess mucus, no productive cough,  No non-productive cough,  No coughing up of blood.  No change in color of mucus.  No wheezing.  No chest wall deformity  Skin: no rash or lesions.  GU: no dysuria, change in color of urine, no urgency or frequency.  No flank pain, no hematuria   MS:  No joint pain or swelling.  No decreased range of motion.  No back pain.    Physical Exam  BP 122/70 (BP Location: Left Arm, Cuff Size: Normal)    Pulse 71    SpO2 96%   GEN: A/Ox3; pleasant , NAD, well nourished    HEENT:  /AT,   NOSE-clear, THROAT-clear, no lesions, no postnasal drip or exudate noted. Class 2-3 MP airway   NECK:  Supple w/ fair ROM; no JVD; normal carotid impulses w/o bruits; no thyromegaly or nodules palpated; no lymphadenopathy.    RESP  Clear  P & A; w/o, wheezes/ rales/ or rhonchi. no accessory muscle use, no dullness to percussion  CARD:  RRR, no m/r/g, no peripheral edema, pulses intact, no cyanosis or clubbing.  GI:   Soft & nt; nml bowel sounds; no organomegaly or masses detected.   Musco: Warm bil, no deformities or joint swelling noted.   Neuro: alert, no focal deficits noted.    Skin: Warm, no lesions or rashes    Lab Results:  CBC  BNP No results found for: BNP  ProBNP No results found for: PROBNP  Imaging:    No flowsheet data found.  No results found for: NITRICOXIDE      Assessment & Plan:   Obstructive sleep apnea Severe OSA -well controlled on CPAP  Needs new CPAP .   Plan  Patient Instructions  Keep up the good work. Order for new CPAP machine.  Continue on C Pap at bedtime Do not drive if sleepy Remain active, healthy weight.  Follow up with Dr. Elsworth Soho  In 1 year and As needed           Rexene Edison, NP 11/12/2018

## 2018-11-12 NOTE — Addendum Note (Signed)
Addended by: Parke Poisson E on: 11/12/2018 10:35 AM   Modules accepted: Orders

## 2018-11-13 ENCOUNTER — Ambulatory Visit: Payer: Medicare Other | Admitting: Internal Medicine

## 2018-11-13 ENCOUNTER — Ambulatory Visit (INDEPENDENT_AMBULATORY_CARE_PROVIDER_SITE_OTHER): Payer: Medicare Other | Admitting: Internal Medicine

## 2018-11-13 ENCOUNTER — Encounter: Payer: Self-pay | Admitting: Internal Medicine

## 2018-11-13 ENCOUNTER — Other Ambulatory Visit (INDEPENDENT_AMBULATORY_CARE_PROVIDER_SITE_OTHER): Payer: Medicare Other

## 2018-11-13 VITALS — BP 124/72 | HR 61 | Temp 98.2°F | Ht 71.0 in | Wt 217.0 lb

## 2018-11-13 DIAGNOSIS — R600 Localized edema: Secondary | ICD-10-CM | POA: Diagnosis not present

## 2018-11-13 DIAGNOSIS — R3989 Other symptoms and signs involving the genitourinary system: Secondary | ICD-10-CM

## 2018-11-13 NOTE — Progress Notes (Signed)
Subjective:  Patient ID: Jon [name], male    DOB: 03/30/1945  Age: 73 y.o. MRN: [id number]  CC: No chief complaint on file.   HPI Jon [name] presents for R leg pain/swelling - much better C/o urine changed color  Outpatient Medications Prior to Visit  Medication Sig Dispense Refill  . aspirin 325 MG EC tablet Take 325 mg by mouth daily.    Marland Kitchen CARTIA XT 240 MG 24 hr capsule TAKE 1 CAPSULE(240 MG) BY MOUTH DAILY 90 capsule 1  . Cholecalciferol 1000 UNITS tablet Take 1,000 Units by mouth daily.     . fenofibrate micronized (LOFIBRA) 134 MG capsule TAKE ONE CAPSULE BY MOUTH DAILY BEFORE BREAKFAST. MUST SCHEDULE ANNUAL APPOINTMENT FOR MORE REFILLS 90 capsule 3  . metFORMIN (GLUCOPHAGE) 1000 MG tablet TAKE 1 TABLET(1000 MG) BY MOUTH TWICE DAILY WITH A MEAL 180 tablet 1  . Multiple Vitamin (MULTIVITAMIN WITH MINERALS) TABS tablet Take 1 tablet by mouth daily.    . naproxen (NAPROSYN) 500 MG tablet Take 1 tablet (500 mg total) by mouth 2 (two) times daily with a meal. 30 tablet 0  . nateglinide (STARLIX) 120 MG tablet TAKE 1 TABLET(120 MG) BY MOUTH THREE TIMES DAILY WITH MEALS 270 tablet 1  . omeprazole (PRILOSEC) 40 MG capsule TAKE 1 CAPSULE BY MOUTH DAILY 90 capsule 3   Facility-Administered Medications Prior to Visit  Medication Dose Route Frequency Provider Last Rate Last Dose  . 0.9 %  sodium chloride infusion  500 mL Intravenous Once Irene Shipper, MD        ROS: Review of Systems  Constitutional: Negative for appetite change, fatigue and unexpected weight change.  HENT: Negative for congestion, nosebleeds, sneezing, sore throat and trouble swallowing.   Eyes: Negative for itching and visual disturbance.  Respiratory: Negative for cough.   Cardiovascular: Negative for chest pain, palpitations and leg swelling.  Gastrointestinal: Negative for abdominal distention, blood in stool, diarrhea and nausea.  Genitourinary: Negative for frequency and hematuria.   Musculoskeletal: Negative for back pain, gait problem, joint swelling and neck pain.  Skin: Negative for rash.  Neurological: Negative for dizziness, tremors, speech difficulty and weakness.  Psychiatric/Behavioral: Negative for agitation, dysphoric mood and sleep disturbance. The patient is not nervous/anxious.     Objective:  BP 124/72 (BP Location: Left Arm, Patient Position: Sitting, Cuff Size: Large)   Pulse 61   Temp 98.2 F (36.8 C) (Oral)   Ht 5\' 11"  (1.803 m)   Wt 217 lb (98.4 kg)   SpO2 94%   BMI 30.27 kg/m   BP Readings from Last 3 Encounters:  11/13/18 124/72  11/12/18 122/70  10/30/18 128/76    Wt Readings from Last 3 Encounters:  11/13/18 217 lb (98.4 kg)  11/12/18 214 lb 9.6 oz (97.3 kg)  10/30/18 214 lb (97.1 kg)    Physical Exam  Lab Results  Component Value Date   WBC 7.8 07/31/2018   HGB 15.3 07/31/2018   HCT 46.4 07/31/2018   PLT 382.0 07/31/2018   GLUCOSE 98 07/31/2018   CHOL 159 12/15/2015   TRIG 222.0 (H) 12/15/2015   HDL 37.00 (L) 12/15/2015   LDLDIRECT 52.0 12/15/2015   LDLCALC 75 12/19/2014   ALT 21 07/31/2018   AST 23 07/31/2018   NA 139 07/31/2018   K 4.5 07/31/2018   CL 100 07/31/2018   CREATININE 1.32 07/31/2018   BUN 21 07/31/2018   CO2 28 07/31/2018   TSH 0.69 07/31/2018   PSA 1.67 07/31/2018  HGBA1C 7.0 (H) 07/31/2018    Vas [place] Lower Extremity Venous (dvt)  Result Date: 11/01/2018  Lower Venous Study Indications: Swelling in the right lower leg without pain x 1 week. He denies SOB.  Risk Factors: None identified. Performing Technologist: Sharlett Iles RVT  Examination Guidelines: A complete evaluation includes B-mode imaging, spectral Doppler, color Doppler, and power Doppler as needed of all accessible portions of each vessel. Bilateral testing is considered an integral part of a complete examination. Limited examinations for reoccurring indications may be performed as noted.   +---------+---------------+---------+-----------+----------+--------------+ RIGHT    CompressibilityPhasicitySpontaneityPropertiesThrombus Aging +---------+---------------+---------+-----------+----------+--------------+ CFV      Full           Yes      Yes                                 +---------+---------------+---------+-----------+----------+--------------+ SFJ      Full           Yes      Yes                                 +---------+---------------+---------+-----------+----------+--------------+ FV Prox  Full           Yes      Yes                                 +---------+---------------+---------+-----------+----------+--------------+ FV Mid   Full                                                        +---------+---------------+---------+-----------+----------+--------------+ FV DistalFull           Yes      Yes                                 +---------+---------------+---------+-----------+----------+--------------+ PFV      Full           Yes      Yes                                 +---------+---------------+---------+-----------+----------+--------------+ POP      Full           Yes      Yes                                 +---------+---------------+---------+-----------+----------+--------------+ PTV      Full           Yes      Yes                                 +---------+---------------+---------+-----------+----------+--------------+ PERO     Full                    No                                  +---------+---------------+---------+-----------+----------+--------------+  Gastroc  Full                                                        +---------+---------------+---------+-----------+----------+--------------+ GSV      Full           Yes      Yes                                 +---------+---------------+---------+-----------+----------+--------------+    +----+---------------+---------+-----------+----------+--------------+ LEFTCompressibilityPhasicitySpontaneityPropertiesThrombus Aging +----+---------------+---------+-----------+----------+--------------+ CFV Full           Yes      Yes                                 +----+---------------+---------+-----------+----------+--------------+   Summary: Right: No evidence of deep vein thrombosis in the lower extremity. No indirect evidence of obstruction proximal to the inguinal ligament. 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  Left: No evidence of common femoral vein obstruction.  *See table(s) above for measurements and observations. Electronically signed by Kathlyn Sacramento MD on 11/01/2018 at 1:05:11 PM.    Final     Assessment & Plan:   There are no diagnoses linked to this encounter.   No orders of the defined types were placed in this encounter.    Follow-up: No follow-ups on file.  Walker Kehr, MD

## 2018-11-13 NOTE — Assessment & Plan Note (Signed)
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

## 2018-11-13 NOTE — Assessment & Plan Note (Signed)
Yellow - check UA

## 2018-11-14 LAB — URINALYSIS
Bilirubin Urine: NEGATIVE
Hgb urine dipstick: NEGATIVE
Ketones, ur: NEGATIVE
Leukocytes,Ua: NEGATIVE
Nitrite: NEGATIVE
Specific Gravity, Urine: 1.02 (ref 1.000–1.030)
Total Protein, Urine: NEGATIVE
Urine Glucose: NEGATIVE
Urobilinogen, UA: 0.2 (ref 0.0–1.0)
pH: 7.5 (ref 5.0–8.0)

## 2018-11-16 ENCOUNTER — Ambulatory Visit: Payer: Self-pay | Admitting: *Deleted

## 2018-11-16 NOTE — Telephone Encounter (Signed)
Pt called in and given urine result message from Dr. Alain Marion dated 11/14/2018 at 11:01 PM. No questions from pt

## 2018-12-04 ENCOUNTER — Telehealth: Payer: Self-pay | Admitting: Internal Medicine

## 2018-12-04 NOTE — Telephone Encounter (Signed)
Provider would place pt on medication if he thought it was best course of action. They do not need to know this information.

## 2018-12-04 NOTE — Telephone Encounter (Signed)
Naja, from [place], called and is requesting to know if pt is allergic to statin medications. Naja states pt is many diabetes medications. Please advise.    Callback # [phone]  Fax # [phone]

## 2018-12-10 ENCOUNTER — Other Ambulatory Visit (INDEPENDENT_AMBULATORY_CARE_PROVIDER_SITE_OTHER): Payer: Medicare Other

## 2018-12-10 ENCOUNTER — Ambulatory Visit (INDEPENDENT_AMBULATORY_CARE_PROVIDER_SITE_OTHER): Payer: Medicare Other | Admitting: Internal Medicine

## 2018-12-10 ENCOUNTER — Other Ambulatory Visit: Payer: Self-pay

## 2018-12-10 ENCOUNTER — Encounter: Payer: Self-pay | Admitting: Internal Medicine

## 2018-12-10 DIAGNOSIS — C169 Malignant neoplasm of stomach, unspecified: Secondary | ICD-10-CM | POA: Diagnosis not present

## 2018-12-10 DIAGNOSIS — I2510 Atherosclerotic heart disease of native coronary artery without angina pectoris: Secondary | ICD-10-CM | POA: Diagnosis not present

## 2018-12-10 DIAGNOSIS — I480 Paroxysmal atrial fibrillation: Secondary | ICD-10-CM

## 2018-12-10 DIAGNOSIS — I2583 Coronary atherosclerosis due to lipid rich plaque: Secondary | ICD-10-CM

## 2018-12-10 DIAGNOSIS — E119 Type 2 diabetes mellitus without complications: Secondary | ICD-10-CM

## 2018-12-10 DIAGNOSIS — I10 Essential (primary) hypertension: Secondary | ICD-10-CM

## 2018-12-10 DIAGNOSIS — E785 Hyperlipidemia, unspecified: Secondary | ICD-10-CM

## 2018-12-10 LAB — BASIC METABOLIC PANEL
BUN: 21 mg/dL (ref 6–23)
CO2: 25 mEq/L (ref 19–32)
Calcium: 9.4 mg/dL (ref 8.4–10.5)
Chloride: 102 mEq/L (ref 96–112)
Creatinine, Ser: 1.28 mg/dL (ref 0.40–1.50)
GFR: 66.56 mL/min (ref >60.00)
Glucose, Bld: 110 mg/dL — ABNORMAL HIGH (ref 70–99)
Potassium: 4 mEq/L (ref 3.5–5.1)
Sodium: 137 mEq/L (ref 135–145)

## 2018-12-10 LAB — HEMOGLOBIN A1C: Hgb A1c MFr Bld: 7 % — ABNORMAL HIGH (ref 4.6–6.5)

## 2018-12-10 NOTE — Patient Instructions (Signed)
If you have medicare related insurance (such as traditional Medicare, Blue Cross Medicare, United HealthCare Medicare, or similar), Please make an appointment at the scheduling desk with Jill, the Wellness Health Coach, for your Wellness visit in this office, which is a benefit with your insurance.  

## 2018-12-10 NOTE — Progress Notes (Signed)
Subjective:  Patient ID: Jon [name], male    DOB: 1946/01/16  Age: 73 y.o. MRN: [id number]  CC: No chief complaint on file.   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 aspirin 325 MG EC tablet Take 325 mg by mouth daily.    Marland Kitchen CARTIA XT 240 MG 24 hr capsule TAKE 1 CAPSULE(240 MG) BY MOUTH DAILY 90 capsule 1  . Cholecalciferol 1000 UNITS tablet Take 1,000 Units by mouth daily.     . fenofibrate micronized (LOFIBRA) 134 MG capsule TAKE ONE CAPSULE BY MOUTH DAILY BEFORE BREAKFAST. MUST SCHEDULE ANNUAL APPOINTMENT FOR MORE REFILLS 90 capsule 3  . metFORMIN (GLUCOPHAGE) 1000 MG tablet TAKE 1 TABLET(1000 MG) BY MOUTH TWICE DAILY WITH A MEAL 180 tablet 1  . Multiple Vitamin (MULTIVITAMIN WITH MINERALS) TABS tablet Take 1 tablet by mouth daily.    . naproxen (NAPROSYN) 500 MG tablet Take 1 tablet (500 mg total) by mouth 2 (two) times daily with a meal. 30 tablet 0  . nateglinide (STARLIX) 120 MG tablet TAKE 1 TABLET(120 MG) BY MOUTH THREE TIMES DAILY WITH MEALS 270 tablet 1  . omeprazole (PRILOSEC) 40 MG capsule TAKE 1 CAPSULE BY MOUTH DAILY 90 capsule 3   Facility-Administered Medications Prior to Visit  Medication Dose Route Frequency Provider Last Rate Last Dose  . 0.9 %  sodium chloride infusion  500 mL Intravenous Once Irene Shipper, MD        ROS: Review of Systems  Constitutional: Negative for appetite change, fatigue and unexpected weight change.  HENT: Negative for congestion, nosebleeds, sneezing, sore throat and trouble swallowing.   Eyes: Negative for itching and visual disturbance.  Respiratory: Negative for cough.   Cardiovascular: Negative for chest pain, palpitations and leg swelling.  Gastrointestinal: Negative for abdominal distention, blood in stool, diarrhea and nausea.  Genitourinary: Negative for frequency and hematuria.  Musculoskeletal: Negative for  back pain, gait problem, joint swelling and neck pain.  Skin: Negative for rash.  Neurological: Negative for dizziness, tremors, speech difficulty and weakness.  Psychiatric/Behavioral: Negative for agitation, dysphoric mood and sleep disturbance. The patient is not nervous/anxious.     Objective:  BP 122/68 (BP Location: Left Arm, Patient Position: Sitting, Cuff Size: Large)   Pulse (!) 59   Temp 98.2 F (36.8 C) (Oral)   Ht 5\' 11"  (1.803 m)   Wt 215 lb (97.5 kg)   SpO2 99%   BMI 29.99 kg/m   BP Readings from Last 3 Encounters:  12/10/18 122/68  11/13/18 124/72  11/12/18 122/70    Wt Readings from Last 3 Encounters:  12/10/18 215 lb (97.5 kg)  11/13/18 217 lb (98.4 kg)  11/12/18 214 lb 9.6 oz (97.3 kg)    Physical Exam Constitutional:      General: He is not in acute distress.    Appearance: He is well-developed.     Comments: NAD  Eyes:     Conjunctiva/sclera: Conjunctivae normal.     Pupils: Pupils are equal, round, and reactive to light.  Neck:     Musculoskeletal: Normal range of motion.     Thyroid: No thyromegaly.     Vascular: No JVD.  Cardiovascular:     Rate and Rhythm: Normal rate and regular rhythm.     Heart sounds: Normal heart sounds. No murmur. No friction rub. No gallop.   Pulmonary:     Effort: Pulmonary  effort is normal. No respiratory distress.     Breath sounds: Normal breath sounds. No wheezing or rales.  Chest:     Chest wall: No tenderness.  Abdominal:     General: Bowel sounds are normal. There is no distension.     Palpations: Abdomen is soft. There is no mass.     Tenderness: There is no abdominal tenderness. There is no guarding or rebound.  Musculoskeletal: Normal range of motion.        General: No tenderness.  Lymphadenopathy:     Cervical: No cervical adenopathy.  Skin:    General: Skin is warm and dry.     Findings: No rash.  Neurological:     Mental Status: He is alert and oriented to person, place, and time.     Cranial  Nerves: No cranial nerve deficit.     Motor: No abnormal muscle tone.     Coordination: Coordination normal.     Gait: Gait normal.     Deep Tendon Reflexes: Reflexes are normal and symmetric.  Psychiatric:        Behavior: Behavior normal.        Thought Content: Thought content normal.        Judgment: Judgment normal.     Lab Results  Component Value Date   WBC 7.8 07/31/2018   HGB 15.3 07/31/2018   HCT 46.4 07/31/2018   PLT 382.0 07/31/2018   GLUCOSE 98 07/31/2018   CHOL 159 12/15/2015   TRIG 222.0 (H) 12/15/2015   HDL 37.00 (L) 12/15/2015   LDLDIRECT 52.0 12/15/2015   LDLCALC 75 12/19/2014   ALT 21 07/31/2018   AST 23 07/31/2018   NA 139 07/31/2018   K 4.5 07/31/2018   CL 100 07/31/2018   CREATININE 1.32 07/31/2018   BUN 21 07/31/2018   CO2 28 07/31/2018   TSH 0.69 07/31/2018   PSA 1.67 07/31/2018   HGBA1C 7.0 (H) 07/31/2018    Vas [place] Lower Extremity Venous (dvt)  Result Date: 11/01/2018  Lower Venous Study Indications: Swelling in the right lower leg without pain x 1 week. He denies SOB.  Risk Factors: None identified. Performing Technologist: Sharlett Iles RVT  Examination Guidelines: A complete evaluation includes B-mode imaging, spectral Doppler, color Doppler, and power Doppler as needed of all accessible portions of each vessel. Bilateral testing is considered an integral part of a complete examination. Limited examinations for reoccurring indications may be performed as noted.  +---------+---------------+---------+-----------+----------+--------------+ RIGHT    CompressibilityPhasicitySpontaneityPropertiesThrombus Aging +---------+---------------+---------+-----------+----------+--------------+ CFV      Full           Yes      Yes                                 +---------+---------------+---------+-----------+----------+--------------+ SFJ      Full           Yes      Yes                                  +---------+---------------+---------+-----------+----------+--------------+ FV Prox  Full           Yes      Yes                                 +---------+---------------+---------+-----------+----------+--------------+ FV Mid  Full                                                        +---------+---------------+---------+-----------+----------+--------------+ FV DistalFull           Yes      Yes                                 +---------+---------------+---------+-----------+----------+--------------+ PFV      Full           Yes      Yes                                 +---------+---------------+---------+-----------+----------+--------------+ POP      Full           Yes      Yes                                 +---------+---------------+---------+-----------+----------+--------------+ PTV      Full           Yes      Yes                                 +---------+---------------+---------+-----------+----------+--------------+ PERO     Full                    No                                  +---------+---------------+---------+-----------+----------+--------------+ Gastroc  Full                                                        +---------+---------------+---------+-----------+----------+--------------+ GSV      Full           Yes      Yes                                 +---------+---------------+---------+-----------+----------+--------------+   +----+---------------+---------+-----------+----------+--------------+ LEFTCompressibilityPhasicitySpontaneityPropertiesThrombus Aging +----+---------------+---------+-----------+----------+--------------+ CFV Full           Yes      Yes                                 +----+---------------+---------+-----------+----------+--------------+   Summary: Right: No evidence of deep vein thrombosis in the lower extremity. No indirect evidence of obstruction proximal to the inguinal ligament.  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  Left: No evidence of common femoral vein obstruction.  *See table(s) above for measurements and observations. Electronically signed by Kathlyn Sacramento MD on 11/01/2018 at 1:05:11 PM.    Final     Assessment & Plan:  There are no diagnoses linked to this encounter.   No orders of the defined types were placed in this encounter.    Follow-up: No follow-ups on file.  Walker Kehr, MD

## 2018-12-10 NOTE — Assessment & Plan Note (Signed)
Metformin, Starlix Statins discussed - LDL at goal

## 2018-12-10 NOTE — Assessment & Plan Note (Addendum)
S/p Chemo and XRT F/u w/Onc once a year CEA once a year per Onc

## 2018-12-10 NOTE — Assessment & Plan Note (Signed)
LDL at goal 

## 2018-12-10 NOTE — Assessment & Plan Note (Addendum)
ASA, fenofibrate Statins discussed - LDL at goal

## 2018-12-10 NOTE — Assessment & Plan Note (Signed)
Cardizem CD, ASA 

## 2018-12-17 ENCOUNTER — Telehealth: Payer: Self-pay | Admitting: Internal Medicine

## 2018-12-17 NOTE — Telephone Encounter (Signed)
Pt says that he was suggest by pharmacy to request a statin for his blood sugar, pt would like to discuss further.   CB: [phone]

## 2018-12-18 NOTE — Telephone Encounter (Signed)
Pt notified if Dr Alain Marion wanted pt on medication, he would be on it.

## 2019-01-09 ENCOUNTER — Telehealth: Payer: Self-pay | Admitting: Internal Medicine

## 2019-01-09 NOTE — Telephone Encounter (Signed)
Jon [name], from [place], called and is requesting to know why pt is not on a statin for his diabetes. Please advise.  [phone]

## 2019-01-09 NOTE — Telephone Encounter (Signed)
If provider wanted pt to be on statin, pt would be.

## 2019-03-12 ENCOUNTER — Telehealth: Payer: Self-pay

## 2019-03-12 MED ORDER — FENOFIBRATE 134 MG PO CAPS: ORAL_CAPSULE | ORAL | 3 refills | Status: DC

## 2019-03-12 MED ORDER — METFORMIN HCL 1000 MG PO TABS: ORAL_TABLET | ORAL | 1 refills | Status: DC

## 2019-03-12 NOTE — Telephone Encounter (Signed)
New message    Medication Requested:  Is medication on med list (if no, inform pt they may need an appointment):  metFORMIN (GLUCOPHAGE) 1000 MG tablet  fenofibrate micronized (LOFIBRA) 134 MG capsule  Diltiazem  240 mg   Is medication a controled (yes = last OV with PCP):   Is the OV > than 4 months (yes = schedule an appt if one is not already made):   Pharmacy (Name [place]): [facility] on [address]

## 2019-03-12 NOTE — Telephone Encounter (Signed)
RX sent

## 2019-03-18 ENCOUNTER — Telehealth: Payer: Self-pay

## 2019-03-18 NOTE — Telephone Encounter (Signed)
New message    Checking on prior authorization  CARTIA XT 240 MG 24 hr capsule

## 2019-03-20 NOTE — Telephone Encounter (Signed)
Key: [id number]

## 2019-03-21 NOTE — Telephone Encounter (Signed)
Outcome Cancelledon February 17  Will have to check office for fax Monday

## 2019-03-23 ENCOUNTER — Ambulatory Visit: Payer: Medicare Other | Attending: Internal Medicine

## 2019-03-23 DIAGNOSIS — Z23 Encounter for immunization: Secondary | ICD-10-CM

## 2019-03-23 NOTE — Progress Notes (Signed)
   Covid-19 Vaccination Clinic  Name:  Jon [name]    MRN: [id number] DOB: 07/07/1945  03/23/2019  [name] was observed post Covid-19 immunization for 15 minutes without incidence. He was provided with Vaccine Information Sheet and instruction to access the V-Safe system.   [name] was instructed to call 911 with any severe reactions post vaccine: Marland Kitchen Difficulty breathing  . Swelling of your face and throat  . A fast heartbeat  . A bad rash all over your body  . Dizziness and weakness    Immunizations Administered    Name Date Dose VIS Date Route   Pfizer COVID-19 Vaccine 03/23/2019  9:05 AM 0.3 mL 01/11/2019 Intramuscular   Manufacturer: [place]   Lot: [id number]   [place]: [id number]

## 2019-04-04 ENCOUNTER — Other Ambulatory Visit: Payer: Self-pay | Admitting: Internal Medicine

## 2019-04-16 ENCOUNTER — Ambulatory Visit: Payer: Medicare Other | Attending: Internal Medicine

## 2019-04-16 ENCOUNTER — Ambulatory Visit: Payer: Medicare Other

## 2019-04-16 DIAGNOSIS — Z23 Encounter for immunization: Secondary | ICD-10-CM

## 2019-04-16 NOTE — Progress Notes (Signed)
   Covid-19 Vaccination Clinic  Name:  Jon [name]    MRN: [id number] DOB: [birth year]  04/16/2019  Jon [name] was observed post Covid-19 immunization for 15 minutes without incident. He was provided with Vaccine Information Sheet and instruction to access the V-Safe system.   Jon [name] was instructed to call 911 with any severe reactions post vaccine: Marland Kitchen Difficulty breathing  . Swelling of face and throat  . A fast heartbeat  . A bad rash all over body  . Dizziness and weakness   Immunizations Administered    Name Date Dose VIS Date Route   Pfizer COVID-19 Vaccine 04/16/2019  2:50 AM 0.3 mL 01/11/2019 Intramuscular   Manufacturer: [place]   Lot: [id number]   [place]: [id number]

## 2019-04-23 ENCOUNTER — Encounter: Payer: Self-pay | Admitting: Internal Medicine

## 2019-04-23 DIAGNOSIS — E119 Type 2 diabetes mellitus without complications: Secondary | ICD-10-CM | POA: Diagnosis not present

## 2019-04-23 DIAGNOSIS — Z961 Presence of intraocular lens: Secondary | ICD-10-CM | POA: Diagnosis not present

## 2019-04-23 DIAGNOSIS — Q141 Congenital malformation of retina: Secondary | ICD-10-CM | POA: Diagnosis not present

## 2019-04-23 DIAGNOSIS — H40023 Open angle with borderline findings, high risk, bilateral: Secondary | ICD-10-CM | POA: Diagnosis not present

## 2019-06-10 ENCOUNTER — Other Ambulatory Visit: Payer: Self-pay

## 2019-06-10 ENCOUNTER — Ambulatory Visit (INDEPENDENT_AMBULATORY_CARE_PROVIDER_SITE_OTHER): Payer: Medicare Other | Admitting: Internal Medicine

## 2019-06-10 ENCOUNTER — Encounter: Payer: Self-pay | Admitting: Internal Medicine

## 2019-06-10 VITALS — BP 124/82 | HR 92 | Temp 97.8°F | Ht 71.0 in | Wt 222.0 lb

## 2019-06-10 DIAGNOSIS — I480 Paroxysmal atrial fibrillation: Secondary | ICD-10-CM | POA: Diagnosis not present

## 2019-06-10 DIAGNOSIS — N320 Bladder-neck obstruction: Secondary | ICD-10-CM

## 2019-06-10 DIAGNOSIS — E119 Type 2 diabetes mellitus without complications: Secondary | ICD-10-CM | POA: Diagnosis not present

## 2019-06-10 DIAGNOSIS — L84 Corns and callosities: Secondary | ICD-10-CM | POA: Diagnosis not present

## 2019-06-10 DIAGNOSIS — E785 Hyperlipidemia, unspecified: Secondary | ICD-10-CM | POA: Diagnosis not present

## 2019-06-10 LAB — CBC WITH DIFFERENTIAL/PLATELET
Basophils Absolute: 0 10*3/uL (ref 0.0–0.1)
Basophils Relative: 0.7 % (ref 0.0–3.0)
Eosinophils Absolute: 0.3 10*3/uL (ref 0.0–0.7)
Eosinophils Relative: 4.2 % (ref 0.0–5.0)
HCT: 47.6 % (ref 39.0–52.0)
Hemoglobin: 15.9 g/dL (ref 13.0–17.0)
Lymphocytes Relative: 32.8 % (ref 12.0–46.0)
Lymphs Abs: 2.2 10*3/uL (ref 0.7–4.0)
MCHC: 33.5 g/dL (ref 30.0–36.0)
MCV: 86 fl (ref 78.0–100.0)
Monocytes Absolute: 0.9 10*3/uL (ref 0.1–1.0)
Monocytes Relative: 12.8 % — ABNORMAL HIGH (ref 3.0–12.0)
Neutro Abs: 3.4 10*3/uL (ref 1.4–7.7)
Neutrophils Relative %: 49.5 % (ref 43.0–77.0)
Platelets: 352 10*3/uL (ref 150.0–400.0)
RBC: 5.54 Mil/uL (ref 4.22–5.81)
RDW: 15.6 % — ABNORMAL HIGH (ref 11.5–15.5)
WBC: 6.8 10*3/uL (ref 4.0–10.5)

## 2019-06-10 LAB — URINALYSIS
Bilirubin Urine: NEGATIVE
Hgb urine dipstick: NEGATIVE
Ketones, ur: NEGATIVE
Leukocytes,Ua: NEGATIVE
Nitrite: NEGATIVE
Specific Gravity, Urine: 1.02 (ref 1.000–1.030)
Total Protein, Urine: NEGATIVE
Urine Glucose: NEGATIVE
Urobilinogen, UA: 1 (ref 0.0–1.0)
pH: 6.5 (ref 5.0–8.0)

## 2019-06-10 LAB — LIPID PANEL
Cholesterol: 137 mg/dL (ref 0–200)
HDL: 30.1 mg/dL — ABNORMAL LOW (ref >39.00)
NonHDL: 107.28
Total CHOL/HDL Ratio: 5
Triglycerides: 211 mg/dL — ABNORMAL HIGH (ref 0.0–149.0)
VLDL: 42.2 mg/dL — ABNORMAL HIGH (ref 0.0–40.0)

## 2019-06-10 LAB — LDL CHOLESTEROL, DIRECT: Direct LDL: 44 mg/dL

## 2019-06-10 LAB — BASIC METABOLIC PANEL
BUN: 23 mg/dL (ref 6–23)
CO2: 28 mEq/L (ref 19–32)
Calcium: 10.2 mg/dL (ref 8.4–10.5)
Chloride: 101 mEq/L (ref 96–112)
Creatinine, Ser: 1.56 mg/dL — ABNORMAL HIGH (ref 0.40–1.50)
GFR: 52.91 mL/min — ABNORMAL LOW (ref >60.00)
Glucose, Bld: 105 mg/dL — ABNORMAL HIGH (ref 70–99)
Potassium: 4.7 mEq/L (ref 3.5–5.1)
Sodium: 138 mEq/L (ref 135–145)

## 2019-06-10 LAB — TSH: TSH: 0.38 u[IU]/mL (ref 0.35–4.50)

## 2019-06-10 LAB — HEPATIC FUNCTION PANEL
ALT: 20 U/L (ref 0–53)
AST: 23 U/L (ref 0–37)
Albumin: 4.6 g/dL (ref 3.5–5.2)
Alkaline Phosphatase: 48 U/L (ref 39–117)
Bilirubin, Direct: 0.1 mg/dL (ref 0.0–0.3)
Total Bilirubin: 0.5 mg/dL (ref 0.2–1.2)
Total Protein: 7 g/dL (ref 6.0–8.3)

## 2019-06-10 LAB — PSA: PSA: 1.38 ng/mL (ref 0.10–4.00)

## 2019-06-10 LAB — HEMOGLOBIN A1C: Hgb A1c MFr Bld: 7 % — ABNORMAL HIGH (ref 4.6–6.5)

## 2019-06-10 NOTE — Assessment & Plan Note (Signed)
Labs

## 2019-06-10 NOTE — Progress Notes (Signed)
Subjective:  Patient ID: Jon [name], male    DOB: [birth year]  Age: 74 y.o. MRN: [id number]  CC: No chief complaint on file.   HPI RANALD BATZ presents for cancer, dyslipidemia, DM C/o R leg weakness - worse due to heel pain   Outpatient Medications Prior to Visit  Medication Sig Dispense Refill  . aspirin 325 MG EC tablet Take 325 mg by mouth daily.    . Cholecalciferol 1000 UNITS tablet Take 1,000 Units by mouth daily.     . fenofibrate micronized (LOFIBRA) 134 MG capsule TAKE ONE CAPSULE BY MOUTH DAILY BEFORE BREAKFAST. 90 capsule 3  . metFORMIN (GLUCOPHAGE) 1000 MG tablet TAKE 1 TABLET(1000 MG) BY MOUTH TWICE DAILY WITH A MEAL 180 tablet 1  . Multiple Vitamin (MULTIVITAMIN WITH MINERALS) TABS tablet Take 1 tablet by mouth daily.    . naproxen (NAPROSYN) 500 MG tablet Take 1 tablet (500 mg total) by mouth 2 (two) times daily with a meal. 30 tablet 0  . nateglinide (STARLIX) 120 MG tablet TAKE 1 TABLET(120 MG) BY MOUTH THREE TIMES DAILY WITH MEALS 270 tablet 1  . omeprazole (PRILOSEC) 40 MG capsule TAKE 1 CAPSULE BY MOUTH DAILY 90 capsule 3  . CARTIA XT 240 MG 24 hr capsule TAKE 1 CAPSULE(240 MG) BY MOUTH DAILY (Patient not taking: Reported on 06/10/2019) 90 capsule 1   Facility-Administered Medications Prior to Visit  Medication Dose Route Frequency Provider Last Rate Last Admin  . 0.9 %  sodium chloride infusion  500 mL Intravenous Once Irene Shipper, MD        ROS: Review of Systems  Constitutional: Negative for appetite change, fatigue and unexpected weight change.  HENT: Negative for congestion, nosebleeds, sneezing, sore throat and trouble swallowing.   Eyes: Negative for itching and visual disturbance.  Respiratory: Negative for cough.   Cardiovascular: Negative for chest pain, palpitations and leg swelling.  Gastrointestinal: Negative for abdominal distention, blood in stool, diarrhea and nausea.  Genitourinary: Negative for frequency and hematuria.    Musculoskeletal: Positive for gait problem. Negative for back pain, joint swelling and neck pain.  Skin: Negative for rash.  Neurological: Positive for weakness. Negative for dizziness, tremors and speech difficulty.  Psychiatric/Behavioral: Negative for agitation, dysphoric mood and sleep disturbance. The patient is not nervous/anxious.     Objective:  BP 124/82 (BP Location: Left Arm, Patient Position: Sitting, Cuff Size: Large)   Pulse 92   Temp 97.8 F (36.6 C) (Oral)   Ht 5\' 11"  (1.803 m)   Wt 222 lb (100.7 kg)   SpO2 91%   BMI 30.96 kg/m   BP Readings from Last 3 Encounters:  06/10/19 124/82  12/10/18 122/68  11/13/18 124/72    Wt Readings from Last 3 Encounters:  06/10/19 222 lb (100.7 kg)  12/10/18 215 lb (97.5 kg)  11/13/18 217 lb (98.4 kg)    Physical Exam Constitutional:      General: He is not in acute distress.    Appearance: He is well-developed.     Comments: NAD  Eyes:     Conjunctiva/sclera: Conjunctivae normal.     Pupils: Pupils are equal, round, and reactive to light.  Neck:     Thyroid: No thyromegaly.     Vascular: No JVD.  Cardiovascular:     Rate and Rhythm: Normal rate and regular rhythm.     Heart sounds: Normal heart sounds. No murmur. No friction rub. No gallop.   Pulmonary:     Effort: Pulmonary  effort is normal. No respiratory distress.     Breath sounds: Normal breath sounds. No wheezing or rales.  Chest:     Chest wall: No tenderness.  Abdominal:     General: Bowel sounds are normal. There is no distension.     Palpations: Abdomen is soft. There is no mass.     Tenderness: There is no abdominal tenderness. There is no guarding or rebound.  Musculoskeletal:        General: No swelling or tenderness. Normal range of motion.     Cervical back: Normal range of motion.  Lymphadenopathy:     Cervical: No cervical adenopathy.  Skin:    General: Skin is warm and dry.     Findings: No rash.  Neurological:     Mental Status: He is  alert and oriented to person, place, and time.     Cranial Nerves: No cranial nerve deficit.     Motor: No abnormal muscle tone.     Coordination: Coordination normal.     Gait: Gait abnormal.     Deep Tendon Reflexes: Reflexes are normal and symmetric.  Psychiatric:        Behavior: Behavior normal.        Thought Content: Thought content normal.        Judgment: Judgment normal.   R heel w/callus MS is ok in B legs. Str leg elev (-) B Limping   Procedure: R   foot callus paring/cutting  Indication:  R  foot callus, painful  Consent: verbal  Risks and benefits were explained to the patient. Skin was cleaned with alcohol. I removed a large callus carefully with a round blade. Skin remained intact. Pain is better. Tolerated well. Complications: none. Bandaid applied      Lab Results  Component Value Date   WBC 7.8 07/31/2018   HGB 15.3 07/31/2018   HCT 46.4 07/31/2018   PLT 382.0 07/31/2018   GLUCOSE 110 (H) 12/10/2018   CHOL 159 12/15/2015   TRIG 222.0 (H) 12/15/2015   HDL 37.00 (L) 12/15/2015   LDLDIRECT 52.0 12/15/2015   LDLCALC 75 12/19/2014   ALT 21 07/31/2018   AST 23 07/31/2018   NA 137 12/10/2018   K 4.0 12/10/2018   CL 102 12/10/2018   CREATININE 1.28 12/10/2018   BUN 21 12/10/2018   CO2 25 12/10/2018   TSH 0.69 07/31/2018   PSA 1.67 07/31/2018   HGBA1C 7.0 (H) 12/10/2018    VAS [place] LOWER EXTREMITY VENOUS (DVT)  Result Date: 11/01/2018  Lower Venous Study Indications: Swelling in the right lower leg without pain x 1 week. He denies SOB.  Risk Factors: None identified. Performing Technologist: Sharlett Iles RVT  Examination Guidelines: A complete evaluation includes B-mode imaging, spectral Doppler, color Doppler, and power Doppler as needed of all accessible portions of each vessel. Bilateral testing is considered an integral part of a complete examination. Limited examinations for reoccurring indications may be performed as noted.   +---------+---------------+---------+-----------+----------+--------------+ RIGHT    CompressibilityPhasicitySpontaneityPropertiesThrombus Aging +---------+---------------+---------+-----------+----------+--------------+ CFV      Full           Yes      Yes                                 +---------+---------------+---------+-----------+----------+--------------+ SFJ      Full           Yes      Yes                                 +---------+---------------+---------+-----------+----------+--------------+  FV Prox  Full           Yes      Yes                                 +---------+---------------+---------+-----------+----------+--------------+ FV Mid   Full                                                        +---------+---------------+---------+-----------+----------+--------------+ FV DistalFull           Yes      Yes                                 +---------+---------------+---------+-----------+----------+--------------+ PFV      Full           Yes      Yes                                 +---------+---------------+---------+-----------+----------+--------------+ POP      Full           Yes      Yes                                 +---------+---------------+---------+-----------+----------+--------------+ PTV      Full           Yes      Yes                                 +---------+---------------+---------+-----------+----------+--------------+ PERO     Full                    No                                  +---------+---------------+---------+-----------+----------+--------------+ Gastroc  Full                                                        +---------+---------------+---------+-----------+----------+--------------+ GSV      Full           Yes      Yes                                 +---------+---------------+---------+-----------+----------+--------------+    +----+---------------+---------+-----------+----------+--------------+ LEFTCompressibilityPhasicitySpontaneityPropertiesThrombus Aging +----+---------------+---------+-----------+----------+--------------+ CFV Full           Yes      Yes                                 +----+---------------+---------+-----------+----------+--------------+   Summary: Right: No evidence of deep vein thrombosis in the lower extremity. No indirect evidence of obstruction proximal to the inguinal ligament. 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  Left: No evidence of common femoral vein obstruction.  *See table(s) above for measurements and observations. Electronically signed by Kathlyn Sacramento MD on 11/01/2018 at 1:05:11 PM.    Final     Assessment & Plan:   There are no diagnoses linked to this encounter.   No orders of the defined types were placed in this encounter.    Follow-up: No follow-ups on file.  Walker Kehr, MD

## 2019-06-10 NOTE — Assessment & Plan Note (Signed)
R w/pain - see paring

## 2019-06-10 NOTE — Addendum Note (Signed)
Addended by: Boris Lown B on: 06/10/2019 09:54 AM   Modules accepted: Orders

## 2019-06-10 NOTE — Assessment & Plan Note (Signed)
Remote. The pt stopped cardizem in 2/21 - no issues Cont w/ ASA

## 2019-06-27 LAB — HM DIABETES EYE EXAM

## 2019-08-01 ENCOUNTER — Other Ambulatory Visit: Payer: Self-pay

## 2019-08-01 ENCOUNTER — Inpatient Hospital Stay: Payer: Medicare Other | Attending: Oncology

## 2019-08-01 DIAGNOSIS — C169 Malignant neoplasm of stomach, unspecified: Secondary | ICD-10-CM

## 2019-08-01 DIAGNOSIS — Z8501 Personal history of malignant neoplasm of esophagus: Secondary | ICD-10-CM | POA: Insufficient documentation

## 2019-08-01 DIAGNOSIS — R970 Elevated carcinoembryonic antigen [CEA]: Secondary | ICD-10-CM | POA: Insufficient documentation

## 2019-08-01 LAB — CEA (IN HOUSE-CHCC): CEA (CHCC-In House): <1 ng/mL (ref 0.00–5.00)

## 2019-08-07 ENCOUNTER — Telehealth: Payer: Self-pay

## 2019-08-07 ENCOUNTER — Other Ambulatory Visit: Payer: Self-pay | Admitting: *Deleted

## 2019-08-07 DIAGNOSIS — C169 Malignant neoplasm of stomach, unspecified: Secondary | ICD-10-CM

## 2019-08-07 NOTE — Telephone Encounter (Signed)
Spoke with patient CEA normal per MD can f/u x6 months patient agreeable no questions or concerns voiced

## 2019-08-08 ENCOUNTER — Telehealth: Payer: Self-pay | Admitting: Oncology

## 2019-08-08 NOTE — Telephone Encounter (Signed)
Scheduled appt per 7/7 sch msg - pt is aware of apt date and time

## 2019-08-16 ENCOUNTER — Other Ambulatory Visit: Payer: Self-pay | Admitting: Internal Medicine

## 2019-10-13 ENCOUNTER — Other Ambulatory Visit: Payer: Self-pay | Admitting: Internal Medicine

## 2019-10-14 ENCOUNTER — Ambulatory Visit: Payer: Medicare Other | Admitting: Internal Medicine

## 2019-10-24 DIAGNOSIS — H40023 Open angle with borderline findings, high risk, bilateral: Secondary | ICD-10-CM | POA: Diagnosis not present

## 2019-11-14 ENCOUNTER — Encounter: Payer: Self-pay | Admitting: Pulmonary Disease

## 2019-11-14 ENCOUNTER — Other Ambulatory Visit: Payer: Self-pay

## 2019-11-14 ENCOUNTER — Ambulatory Visit: Payer: Medicare Other | Admitting: Pulmonary Disease

## 2019-11-14 VITALS — BP 122/76 | HR 61 | Temp 96.0°F | Ht 70.0 in | Wt 220.6 lb

## 2019-11-14 DIAGNOSIS — G4733 Obstructive sleep apnea (adult) (pediatric): Secondary | ICD-10-CM

## 2019-11-14 DIAGNOSIS — J700 Acute pulmonary manifestations due to radiation: Secondary | ICD-10-CM

## 2019-11-14 NOTE — Assessment & Plan Note (Signed)
Stable

## 2019-11-14 NOTE — Assessment & Plan Note (Signed)
Prescription for new auto CPAP 8 to 15 cm will be sent to DME. We do not know his exact pressure settings. Download has been reviewed in the past and has shown good compliance.  He is very compliant by history and cannot sleep without his machine.  CPAP is certainly helped improve his daytime somnolence and fatigue  We will check download in 1 month and tweak settings as needed

## 2019-11-14 NOTE — Patient Instructions (Signed)
Prescription for auto CPAP 8 to 15 cm will be sent to DME/[facility] Call us if you do not receive this in 1 week Call us in 1 month so that we can pull report and tweak settings

## 2019-11-14 NOTE — Progress Notes (Signed)
   Subjective:    Patient ID: Jon [name], male    DOB: 1945/03/17, 74 y.o.   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  Cannot sleep without it.  He once went on vacation and did not take his machine and had to drive ride back home to pick it up the next day. No problems with mask or pressure.   Significant tests/ events reviewed  NPSG 2007: AHI 74/hr  Review of Systems neg for any significant sore throat, dysphagia, itching, sneezing, nasal congestion or excess/ purulent secretions, fever, chills, sweats, unintended wt loss, pleuritic or exertional cp, hempoptysis, orthopnea pnd or change in chronic leg swelling. Also denies presyncope, palpitations, heartburn, abdominal pain, nausea, vomiting, diarrhea or change in bowel or urinary habits, dysuria,hematuria, rash, arthralgias, visual complaints, headache, numbness weakness or ataxia.     Objective:   Physical Exam  Gen. Pleasant, well-nourished, in no distress ENT - no thrush, no pallor/icterus,no post nasal drip Neck: No JVD, no thyromegaly, no carotid bruits Lungs: no use of accessory muscles, no dullness to percussion, clear without rales or rhonchi  Cardiovascular: Rhythm regular, heart sounds  normal, no murmurs or gallops, no peripheral edema Musculoskeletal: No deformities, no cyanosis or clubbing        Assessment & Plan:

## 2019-11-19 ENCOUNTER — Telehealth: Payer: Self-pay | Admitting: Internal Medicine

## 2019-11-19 NOTE — Telephone Encounter (Signed)
   Baxter Flattery from [facility] calling to suggest patient may need to be on a "statin" drug. She is also sending a fax for completion  Phone [phone], option 3 then option 4

## 2019-12-16 ENCOUNTER — Other Ambulatory Visit: Payer: Self-pay

## 2019-12-16 ENCOUNTER — Ambulatory Visit (INDEPENDENT_AMBULATORY_CARE_PROVIDER_SITE_OTHER): Payer: Medicare Other | Admitting: Internal Medicine

## 2019-12-16 ENCOUNTER — Encounter: Payer: Self-pay | Admitting: Internal Medicine

## 2019-12-16 DIAGNOSIS — I480 Paroxysmal atrial fibrillation: Secondary | ICD-10-CM

## 2019-12-16 DIAGNOSIS — I2583 Coronary atherosclerosis due to lipid rich plaque: Secondary | ICD-10-CM

## 2019-12-16 DIAGNOSIS — E119 Type 2 diabetes mellitus without complications: Secondary | ICD-10-CM

## 2019-12-16 DIAGNOSIS — I10 Essential (primary) hypertension: Secondary | ICD-10-CM | POA: Diagnosis not present

## 2019-12-16 DIAGNOSIS — Z23 Encounter for immunization: Secondary | ICD-10-CM

## 2019-12-16 DIAGNOSIS — I2510 Atherosclerotic heart disease of native coronary artery without angina pectoris: Secondary | ICD-10-CM

## 2019-12-16 DIAGNOSIS — C169 Malignant neoplasm of stomach, unspecified: Secondary | ICD-10-CM

## 2019-12-16 DIAGNOSIS — R202 Paresthesia of skin: Secondary | ICD-10-CM

## 2019-12-16 LAB — COMPREHENSIVE METABOLIC PANEL
ALT: 21 U/L (ref 0–53)
AST: 26 U/L (ref 0–37)
Albumin: 4.5 g/dL (ref 3.5–5.2)
Alkaline Phosphatase: 49 U/L (ref 39–117)
BUN: 21 mg/dL (ref 6–23)
CO2: 29 mEq/L (ref 19–32)
Calcium: 9.5 mg/dL (ref 8.4–10.5)
Chloride: 102 mEq/L (ref 96–112)
Creatinine, Ser: 1.44 mg/dL (ref 0.40–1.50)
GFR: 47.86 mL/min — ABNORMAL LOW (ref >60.00)
Glucose, Bld: 95 mg/dL (ref 70–99)
Potassium: 4 mEq/L (ref 3.5–5.1)
Sodium: 138 mEq/L (ref 135–145)
Total Bilirubin: 0.4 mg/dL (ref 0.2–1.2)
Total Protein: 7.1 g/dL (ref 6.0–8.3)

## 2019-12-16 LAB — CBC WITH DIFFERENTIAL/PLATELET
Basophils Absolute: 0.1 10*3/uL (ref 0.0–0.1)
Basophils Relative: 1.1 % (ref 0.0–3.0)
Eosinophils Absolute: 0.4 10*3/uL (ref 0.0–0.7)
Eosinophils Relative: 5.1 % — ABNORMAL HIGH (ref 0.0–5.0)
HCT: 46.3 % (ref 39.0–52.0)
Hemoglobin: 15.4 g/dL (ref 13.0–17.0)
Lymphocytes Relative: 34.4 % (ref 12.0–46.0)
Lymphs Abs: 2.4 10*3/uL (ref 0.7–4.0)
MCHC: 33.2 g/dL (ref 30.0–36.0)
MCV: 85.8 fl (ref 78.0–100.0)
Monocytes Absolute: 1 10*3/uL (ref 0.1–1.0)
Monocytes Relative: 14.4 % — ABNORMAL HIGH (ref 3.0–12.0)
Neutro Abs: 3.2 10*3/uL (ref 1.4–7.7)
Neutrophils Relative %: 45 % (ref 43.0–77.0)
Platelets: 362 10*3/uL (ref 150.0–400.0)
RBC: 5.4 Mil/uL (ref 4.22–5.81)
RDW: 15.1 % (ref 11.5–15.5)
WBC: 7 10*3/uL (ref 4.0–10.5)

## 2019-12-16 LAB — LIPID PANEL
Cholesterol: 120 mg/dL (ref 0–200)
HDL: 37.8 mg/dL — ABNORMAL LOW (ref >39.00)
LDL Cholesterol: 51 mg/dL (ref 0–99)
NonHDL: 81.75
Total CHOL/HDL Ratio: 3
Triglycerides: 156 mg/dL — ABNORMAL HIGH (ref 0.0–149.0)
VLDL: 31.2 mg/dL (ref 0.0–40.0)

## 2019-12-16 LAB — HEMOGLOBIN A1C: Hgb A1c MFr Bld: 7.1 % — ABNORMAL HIGH (ref 4.6–6.5)

## 2019-12-16 NOTE — Addendum Note (Signed)
Addended by: Earnstine Regal on: 12/16/2019 08:38 AM   Modules accepted: Orders

## 2019-12-16 NOTE — Assessment & Plan Note (Signed)
CEA in Jan

## 2019-12-16 NOTE — Assessment & Plan Note (Signed)
  Statins discussed and declined by pt - LDL at goal: 44. On fenofibrate.

## 2019-12-16 NOTE — Assessment & Plan Note (Signed)
F/u w/Dr Amedeo Plenty

## 2019-12-16 NOTE — Assessment & Plan Note (Signed)
Metformin, Starlix Statins discussed and declined by pt - LDL at goal: 44. On fenofibrate.

## 2019-12-16 NOTE — Addendum Note (Signed)
Addended by: Trenda Moots on: 70/02/7492 08:35 AM   Modules accepted: Orders

## 2019-12-16 NOTE — Progress Notes (Signed)
Subjective:  Patient ID: Jon [name], male    DOB: 1945/10/21  Age: 74 y.o. MRN: [id number]  CC: Follow-up (6 Month F/U)   HPI Jon [name] presents for DM, dyslipidemia, L arm pain f/u  Outpatient Medications Prior to Visit  Medication Sig Dispense Refill  . aspirin 325 MG EC tablet Take 325 mg by mouth daily.    . Cholecalciferol 1000 UNITS tablet Take 1,000 Units by mouth daily.     . fenofibrate micronized (LOFIBRA) 134 MG capsule TAKE ONE CAPSULE BY MOUTH DAILY BEFORE BREAKFAST. 90 capsule 3  . metFORMIN (GLUCOPHAGE) 1000 MG tablet TAKE 1 TABLET(1000 MG) BY MOUTH TWICE DAILY WITH A MEAL 180 tablet 3  . Multiple Vitamin (MULTIVITAMIN WITH MINERALS) TABS tablet Take 1 tablet by mouth daily.    . nateglinide (STARLIX) 120 MG tablet TAKE 1 TABLET(120 MG) BY MOUTH THREE TIMES DAILY WITH MEALS 270 tablet 3  . omeprazole (PRILOSEC) 40 MG capsule TAKE 1 CAPSULE BY MOUTH DAILY 90 capsule 2  . naproxen (NAPROSYN) 500 MG tablet Take 1 tablet (500 mg total) by mouth 2 (two) times daily with a meal. (Patient not taking: Reported on 12/16/2019) 30 tablet 0   No facility-administered medications prior to visit.    ROS: Review of Systems  Constitutional: Negative for appetite change, fatigue and unexpected weight change.  HENT: Negative for congestion, nosebleeds, sneezing, sore throat and trouble swallowing.   Eyes: Negative for itching and visual disturbance.  Respiratory: Negative for cough.   Cardiovascular: Negative for chest pain, palpitations and leg swelling.  Gastrointestinal: Negative for abdominal distention, blood in stool, diarrhea and nausea.  Genitourinary: Negative for frequency and hematuria.  Musculoskeletal: Negative for back pain, gait problem, joint swelling and neck pain.  Skin: Negative for rash.  Neurological: Positive for numbness. Negative for dizziness, tremors, speech difficulty and weakness.  Psychiatric/Behavioral: Negative for agitation, dysphoric  mood and sleep disturbance. The patient is not nervous/anxious.     Objective:  BP 122/80 (BP Location: Left Arm)   Pulse (!) 56   Temp 98 F (36.7 C) (Oral)   Wt 220 lb 6.4 oz (100 kg)   SpO2 97%   BMI 31.62 kg/m   BP Readings from Last 3 Encounters:  12/16/19 122/80  11/14/19 122/76  06/10/19 124/82    Wt Readings from Last 3 Encounters:  12/16/19 220 lb 6.4 oz (100 kg)  11/14/19 220 lb 9.6 oz (100.1 kg)  06/10/19 222 lb (100.7 kg)    Physical Exam Constitutional:      General: He is not in acute distress.    Appearance: He is well-developed.     Comments: NAD  Eyes:     Conjunctiva/sclera: Conjunctivae normal.     Pupils: Pupils are equal, round, and reactive to light.  Neck:     Thyroid: No thyromegaly.     Vascular: No JVD.  Cardiovascular:     Rate and Rhythm: Normal rate and regular rhythm.     Heart sounds: Normal heart sounds. No murmur heard.  No friction rub. No gallop.   Pulmonary:     Effort: Pulmonary effort is normal. No respiratory distress.     Breath sounds: Normal breath sounds. No wheezing or rales.  Chest:     Chest wall: No tenderness.  Abdominal:     General: Bowel sounds are normal. There is no distension.     Palpations: Abdomen is soft. There is no mass.     Tenderness: There is no  abdominal tenderness. There is no guarding or rebound.  Musculoskeletal:        General: No tenderness. Normal range of motion.     Cervical back: Normal range of motion.  Lymphadenopathy:     Cervical: No cervical adenopathy.  Skin:    General: Skin is warm and dry.     Findings: No rash.  Neurological:     Mental Status: He is alert and oriented to person, place, and time.     Cranial Nerves: No cranial nerve deficit.     Motor: No abnormal muscle tone.     Coordination: Coordination normal.     Gait: Gait normal.     Deep Tendon Reflexes: Reflexes are normal and symmetric.  Psychiatric:        Behavior: Behavior normal.        Thought Content:  Thought content normal.        Judgment: Judgment normal.     Lab Results  Component Value Date   WBC 6.8 06/10/2019   HGB 15.9 06/10/2019   HCT 47.6 06/10/2019   PLT 352.0 06/10/2019   GLUCOSE 105 (H) 06/10/2019   CHOL 137 06/10/2019   TRIG 211.0 (H) 06/10/2019   HDL 30.10 (L) 06/10/2019   LDLDIRECT 44.0 06/10/2019   LDLCALC 75 12/19/2014   ALT 20 06/10/2019   AST 23 06/10/2019   NA 138 06/10/2019   K 4.7 06/10/2019   CL 101 06/10/2019   CREATININE 1.56 (H) 06/10/2019   BUN 23 06/10/2019   CO2 28 06/10/2019   TSH 0.38 06/10/2019   PSA 1.38 06/10/2019   HGBA1C 7.0 (H) 06/10/2019    VAS [place] LOWER EXTREMITY VENOUS (DVT)  Result Date: 11/01/2018  Lower Venous Study Indications: Swelling in the right lower leg without pain x 1 week. He denies SOB.  Risk Factors: None identified. Performing Technologist: Sharlett Iles RVT  Examination Guidelines: A complete evaluation includes B-mode imaging, spectral Doppler, color Doppler, and power Doppler as needed of all accessible portions of each vessel. Bilateral testing is considered an integral part of a complete examination. Limited examinations for reoccurring indications may be performed as noted.  +---------+---------------+---------+-----------+----------+--------------+ RIGHT    CompressibilityPhasicitySpontaneityPropertiesThrombus Aging +---------+---------------+---------+-----------+----------+--------------+ CFV      Full           Yes      Yes                                 +---------+---------------+---------+-----------+----------+--------------+ SFJ      Full           Yes      Yes                                 +---------+---------------+---------+-----------+----------+--------------+ FV Prox  Full           Yes      Yes                                 +---------+---------------+---------+-----------+----------+--------------+ FV Mid   Full                                                         +---------+---------------+---------+-----------+----------+--------------+  FV DistalFull           Yes      Yes                                 +---------+---------------+---------+-----------+----------+--------------+ PFV      Full           Yes      Yes                                 +---------+---------------+---------+-----------+----------+--------------+ POP      Full           Yes      Yes                                 +---------+---------------+---------+-----------+----------+--------------+ PTV      Full           Yes      Yes                                 +---------+---------------+---------+-----------+----------+--------------+ PERO     Full                    No                                  +---------+---------------+---------+-----------+----------+--------------+ Gastroc  Full                                                        +---------+---------------+---------+-----------+----------+--------------+ GSV      Full           Yes      Yes                                 +---------+---------------+---------+-----------+----------+--------------+   +----+---------------+---------+-----------+----------+--------------+ LEFTCompressibilityPhasicitySpontaneityPropertiesThrombus Aging +----+---------------+---------+-----------+----------+--------------+ CFV Full           Yes      Yes                                 +----+---------------+---------+-----------+----------+--------------+   Summary: Right: No evidence of deep vein thrombosis in the lower extremity. No indirect evidence of obstruction proximal to the inguinal ligament. 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  Left: No evidence of common femoral vein obstruction.  *See table(s) above for measurements and observations. Electronically signed by Kathlyn Sacramento MD on 11/01/2018 at 1:05:11 PM.     Final     Assessment & Plan:   There are no diagnoses linked to this encounter.   No orders of the defined types were placed in this encounter.    Follow-up: No follow-ups on file.  Walker Kehr, MD

## 2019-12-16 NOTE — Assessment & Plan Note (Signed)
Cardizem was stopped in 2020 BP Readings from Last 3 Encounters:  12/16/19 122/80  11/14/19 122/76  06/10/19 124/82

## 2019-12-16 NOTE — Assessment & Plan Note (Signed)
ASA

## 2019-12-17 ENCOUNTER — Ambulatory Visit (INDEPENDENT_AMBULATORY_CARE_PROVIDER_SITE_OTHER): Payer: Medicare Other

## 2019-12-17 DIAGNOSIS — Z0000 Encounter for general adult medical examination without abnormal findings: Secondary | ICD-10-CM | POA: Diagnosis not present

## 2019-12-17 NOTE — Progress Notes (Addendum)
Subjective:   Jon [name] is a 74 y.o. male who presents for Medicare Annual/Subsequent preventive examination.  Review of Systems    No ROS. Medicare Wellness Visit. Cardiac Risk Factors include: advanced age (>31men, >31 women);diabetes mellitus;dyslipidemia;hypertension;male gender;obesity (BMI >30kg/m2)     Objective:    There were no vitals filed for this visit. There is no height or weight on file to calculate BMI.  Advanced Directives 12/17/2019 08/02/2018 06/20/2016 11/23/2015 10/21/2015 07/14/2015 06/18/2015  Does Patient Have a Medical Advance Directive? No Yes Yes No No No (No Data)  Type of Advance Directive - Living will Living will - - - -  Does patient want to make changes to medical advance directive? - No - Patient declined - - - - -  Copy of [place] in Chart? - - - - - - -  Would patient like information on creating a medical advance directive? No - Patient declined - - - No - patient declined information No - patient declined information -    Current Medications (verified) Outpatient Encounter Medications as of 12/17/2019  Medication Sig   aspirin 325 MG EC tablet Take 325 mg by mouth daily.   Cholecalciferol 1000 UNITS tablet Take 1,000 Units by mouth daily.    fenofibrate micronized (LOFIBRA) 134 MG capsule TAKE ONE CAPSULE BY MOUTH DAILY BEFORE BREAKFAST.   metFORMIN (GLUCOPHAGE) 1000 MG tablet TAKE 1 TABLET(1000 MG) BY MOUTH TWICE DAILY WITH A MEAL   Multiple Vitamin (MULTIVITAMIN WITH MINERALS) TABS tablet Take 1 tablet by mouth daily.   nateglinide (STARLIX) 120 MG tablet TAKE 1 TABLET(120 MG) BY MOUTH THREE TIMES DAILY WITH MEALS   omeprazole (PRILOSEC) 40 MG capsule TAKE 1 CAPSULE BY MOUTH DAILY   No facility-administered encounter medications on file as of 12/17/2019.    Allergies (verified) Patient has no known allergies.   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 pylori infection 2011   Heart murmur    History of radiation therapy 09/20/10 thru 10/29/10   gastroesophageal junction/gastric cardia   Hypertension    Neuromuscular disorder ([place])    Other and unspecified hyperlipidemia    Sleep apnea    On CPAP   Status post chemotherapy 09/22/10 thru 10/27/10   concurrent w/radiation   Unspecified sleep apnea    Past Surgical History:  Procedure Laterality Date   CATARACT EXTRACTION     bilateral   COLONOSCOPY     ELBOW BURSA SURGERY     LEG SURGERY  1980   right leg    SKIN BIOPSY  08/22/11   shave biopsy =benign   TONSILLECTOMY     Family History  Problem Relation Age of Onset   Arthritis Mother    Diabetes Father    Diabetes Brother    Coronary artery disease Neg Hx    Colon cancer Neg Hx    Esophageal cancer Neg Hx    Rectal cancer Neg Hx    Stomach cancer Neg Hx    Social History   Socioeconomic History   Marital status: Divorced    Spouse name: Not on file   Number of children: 2  Years of education: Not on file   Highest education level: Not on file  Occupational History   Occupation: retired    Fish farm manager: RETIRED  Tobacco Use   Smoking status: Former Smoker    Packs/day: 1.00    Years: 40.00    Pack years: 40.00    Types: Cigarettes    Quit date: 01/31/2005    Years since quitting: 14.8   Smokeless tobacco: Never Used  Substance and Sexual Activity   Alcohol use: Yes    Alcohol/week: 0.0 standard drinks    Comment: 2 drinks last 6 months per pt   Drug use: No   Sexual activity: Yes  Other Topics Concern   Not on file  Social History Narrative   Not on file   Social Determinants of Health   Financial Resource Strain: Low Risk    Difficulty of Paying Living Expenses: Not  hard at all  Food Insecurity: No Food Insecurity   Worried About Charity fundraiser in the Last Year: Never true   [place] in the Last Year: Never true  Transportation Needs: No Transportation Needs   Lack of Transportation (Medical): No   Lack of Transportation (Non-Medical): No  Physical Activity: Sufficiently Active   Days of Exercise per Week: 5 days   Minutes of Exercise per Session: 30 min  Stress: No Stress Concern Present   Feeling of Stress : Not at all  Social Connections: Moderately Integrated   Frequency of Communication with Friends and Family: More than three times a week   Frequency of Social Gatherings with Friends and Family: Once a week   Attends Religious Services: Never   Marine scientist or Organizations: Yes   Attends Music therapist: 1 to 4 times per year   Marital Status: Married    Tobacco Counseling Counseling given: Not Answered   Clinical Intake:  Pre-visit preparation completed: Yes  Pain : No/denies pain     Nutritional Risks: None Diabetes: Yes CBG done?: No Did pt. bring in CBG monitor from home?: No  How often do you need to have someone help you when you read instructions, pamphlets, or other written materials from your doctor or pharmacy?: 1 - Never What is the last grade level you completed in school?: Associate's Degree  Diabetic? yes  Interpreter Needed?: No  Information entered by :: Lisette Abu, LPN   Activities of Daily Living In your present state of health, do you have any difficulty performing the following activities: 12/17/2019  Hearing? N  Vision? N  Difficulty concentrating or making decisions? N  Walking or climbing stairs? N  Dressing or bathing? N  Doing errands, shopping? N  Preparing Food and eating ? N  Using the Toilet? N  In the past six months, have you accidently leaked urine? N  Do you have problems with loss of bowel control? N  Managing your Medications? N    Managing your Finances? N  Housekeeping or managing your Housekeeping? N  Some recent data might be hidden    Patient Care Team: Plotnikov, Evie Lacks, MD as PCP - General Henrene Pastor Docia Chuck, MD (Gastroenterology) Fay Records, MD (Cardiology) Clance, Armando Reichert, MD (Pulmonary Disease) Ladell Pier, MD as Consulting Physician (Internal Medicine) Kyung Rudd, MD as Consulting Physician (Radiation Oncology) Stark Klein, MD as Consulting Physician (General Surgery) Irene Shipper, MD as Consulting Physician (Gastroenterology) Nicanor Alcon, MD (Thoracic Surgery)  Indicate any recent Medical Services you  may have received from other than [facility] providers in the past year (date may be approximate).     Assessment:   This is a routine wellness examination for Abhimanyu.  Hearing/Vision screen No exam data present  Dietary issues and exercise activities discussed: Current Exercise Habits: Home exercise routine, Type of exercise: walking;Other - see comments (yard work, still Corporate treasurer, plumbing, roofing), Time (Minutes): 30, Frequency (Times/Week): 5, Weekly Exercise (Minutes/Week): 150, Intensity: Moderate, Exercise limited by: cardiac condition(s)  Goals      Exercise 150 minutes per week (moderate activity)     Will start riding bike again in the evening; Weight; goal is 205 - 210;  Start date 1st of June; will try to ride 3 time a week  Wt loss 7 to 10 lbs        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 reviewed for goal setting: Includes; family hx; BP control <140/80; monitoring renal disease; obesity (BMI >30); sedentary lifestyle Educated regarding Metabolic syndrome / Excess weight around the waist; can lose with walking vigoursly x 30 min or biking x 30 / reach for 5 days a week  Waist circumference for Men >40   Triglycerides > 150 HDL < 50 ( 36 is your score)  BP > 130/85 Glucose > 100 Plan Lifestyle  changes  Heart Healthy Diet; less sugar  Mediterranean Diet: eating primarily plant-based food such as fruits and vegetables, whole grains, legumes and nuts; replacing butter with healthy fats such as olive oil and canola oil Using herbs and spices instead of salt to flavor food Limiting red meat to no more than a few times a month Eating fish and poultry at least 2 times a week Getting plenty of exercise  BP Education: <120/80; any elevation will warrant further checks  BP can be elevated by lack of quality sleep OTC cold medications  Hormone Therapy post menopause  Eating to much sodium Processed foods (daily intake 2500mg  or one teaspoon of salt) Drinking to much alcohol  Smoking Lack of physical activity              Depression Screen PHQ 2/9 Scores 12/17/2019 06/10/2019 03/28/2018 12/20/2016 07/14/2015 06/18/2015 06/18/2014  PHQ - 2 Score 0 0 0 0 0 0 0    Fall Risk Fall Risk  12/17/2019 06/10/2019 03/28/2018 12/20/2016 07/14/2015  Falls in the past year? 0 0 0 No No  Number falls in past yr: 0 - - - -  Injury with Fall? 0 - - - -  Risk for fall due to : No Fall Risks - - - -  Follow up Falls evaluation completed Falls evaluation completed Falls evaluation completed - -    Any stairs in or around the home? No  If so, are there any without handrails? No  Home free of loose throw rugs in walkways, pet beds, electrical cords, etc? Yes  Adequate lighting in your home to reduce risk of falls? Yes   ASSISTIVE DEVICES UTILIZED TO PREVENT FALLS:  Life alert? No  Use of a cane, walker or w/c? No  Grab bars in the bathroom? No  Shower chair or bench in shower? Yes  Elevated toilet seat or a handicapped toilet? Yes   TIMED UP AND GO:  Was the test performed? No .  Length of time to ambulate 10 feet: 0 sec.   Gait steady and fast without use of assistive device  Cognitive Function:  MMSE - Mini Mental State Exam 06/18/2015  Not completed: (No Data)         Immunizations Immunization History  Administered Date(s) Administered   PFIZER SARS-COV-2 Vaccination 03/23/2019, 04/16/2019   Pneumococcal Conjugate-13 12/18/2012, 12/16/2019   Pneumococcal Polysaccharide-23 03/15/2011, 03/21/2017   Td 03/18/2004, 06/18/2014   Zoster 12/31/2009   Zoster Recombinat (Shingrix) 01/30/2017, 04/05/2017    TDAP status: Up to date Flu Vaccine status: Declined, Education has been provided regarding the importance of this vaccine but patient still declined. Advised may receive this vaccine at local pharmacy or Health Dept. Aware to provide a copy of the vaccination record if obtained from local pharmacy or Health Dept. Verbalized acceptance and understanding. Pneumococcal vaccine status: Up to date Covid-19 vaccine status: Completed vaccines  Qualifies for Shingles Vaccine? Yes   Zostavax completed Yes   Shingrix Completed?: Yes  Screening Tests Health Maintenance  Topic Date Due   Hepatitis C Screening  Never done   URINE MICROALBUMIN  Never done   FOOT EXAM  12/18/2013   INFLUENZA VACCINE  04/30/2020 (Originally 09/01/2019)   HEMOGLOBIN A1C  06/14/2020   OPHTHALMOLOGY EXAM  06/26/2020   COLONOSCOPY  06/20/2023   TETANUS/TDAP  06/17/2024   COVID-19 Vaccine  Completed   PNA vac Low Risk Adult  Completed    Health Maintenance  Health Maintenance Due  Topic Date Due   Hepatitis C Screening  Never done   URINE MICROALBUMIN  Never done   FOOT EXAM  12/18/2013    Colorectal cancer screening: Completed 06/20/2018. Repeat every 5 years  Lung Cancer Screening: (Low Dose CT Chest recommended if Age 23-80 years, 30 pack-year currently smoking OR have quit w/in 15years.) does qualify.   Lung Cancer Screening Referral: no  Additional Screening:  Hepatitis C Screening: does qualify; Completed: never done  Vision Screening: Recommended annual ophthalmology exams for early detection of glaucoma and other disorders of the eye. Is the patient up to  date with their annual eye exam?  Yes  Who is the provider or what is the name of the office in which the patient attends annual eye exams? Dr[facility] Marshall [facility] at [facility] If pt is not established with a provider, would they like to be referred to a provider to establish care? No .   Dental Screening: Recommended annual dental exams for proper oral hygiene  Community Resource Referral / Chronic Care Management: CRR required this visit?  No   CCM required this visit?  No      Plan:     I have personally reviewed and noted the following in the patient's chart:   Medical and social history Use of alcohol, tobacco or illicit drugs  Current medications and supplements Functional ability and status Nutritional status Physical activity Advanced directives List of other physicians Hospitalizations, surgeries, and ER visits in previous 12 months Vitals Screenings to include cognitive, depression, and falls Referrals and appointments  In addition, I have reviewed and discussed with patient certain preventive protocols, quality metrics, and best practice recommendations. A written personalized care plan for preventive services as well as general preventive health recommendations were provided to patient.     Sheral Flow, LPN   18/84/1660   Nurse Notes:  Patient is cogitatively intact. There were no vitals filed for this visit. There is no height or weight on file to calculate BMI.  Medical screening examination/treatment/procedure(s) were performed by non-physician practitioner and as supervising physician I was immediately available for consultation/collaboration.  I agree  with above. Lew Dawes, MD

## 2019-12-17 NOTE — Patient Instructions (Signed)
Jon [name] , Thank you for taking time to come for your Medicare Wellness Visit. I appreciate your ongoing commitment to your health goals. Please review the following plan we discussed and let me know if I can assist you in the future.   Screening recommendations/referrals: Colonoscopy: 06/20/2018; due every 5 years Recommended yearly ophthalmology/optometry visit for glaucoma screening and checkup Recommended yearly dental visit for hygiene and checkup  Vaccinations: Influenza vaccine: declined Pneumococcal vaccine: up to date Tdap vaccine: 06/18/2014; due every 10 years Shingles vaccine: up to date   Covid-19: up to date  Advanced directives: Advance directive discussed with you today. Even though you declined this today please call our office should you change your mind and we can give you the proper paperwork for you to fill out.  Conditions/risks identified: Yes; Reviewed health maintenance screenings with patient today and relevant education, vaccines, and/or referrals were provided. Please continue to do your personal lifestyle choices by: daily care of teeth and gums, regular physical activity (goal should be 5 days a week for 30 minutes), eat a healthy diet, avoid tobacco and drug use, limiting any alcohol intake, taking a low-dose aspirin (if not allergic or have been advised by your provider otherwise) and taking vitamins and minerals as recommended by your provider. Continue doing brain stimulating activities (puzzles, reading, adult coloring books, staying active) to keep memory sharp. Continue to eat heart healthy diet (full of fruits, vegetables, whole grains, lean protein, water--limit salt, fat, and sugar intake) and increase physical activity as tolerated.  Next appointment: Please schedule your next Medicare Wellness Visit with your Nurse Health Advisor in 1 year by calling [phone].  Preventive Care 29 Years and Older, Male Preventive care refers to lifestyle choices  and visits with your health care provider that can promote health and wellness. What does preventive care include?  A yearly physical exam. This is also called an annual well check.  Dental exams once or twice a year.  Routine eye exams. Ask your health care provider how often you should have your eyes checked.  Personal lifestyle choices, including:  Daily care of your teeth and gums.  Regular physical activity.  Eating a healthy diet.  Avoiding tobacco and drug use.  Limiting alcohol use.  Practicing safe sex.  Taking low doses of aspirin every day.  Taking vitamin and mineral supplements as recommended by your health care provider. What happens during an annual well check? The services and screenings done by your health care provider during your annual well check will depend on your age, overall health, lifestyle risk factors, and family history of disease. Counseling  Your health care provider may ask you questions about your:  Alcohol use.  Tobacco use.  Drug use.  Emotional well-being.  Home and relationship well-being.  Sexual activity.  Eating habits.  History of falls.  Memory and ability to understand (cognition).  Work and work Statistician. Screening  You may have the following tests or measurements:  Height, weight, and BMI.  Blood pressure.  Lipid and cholesterol levels. These may be checked every 5 years, or more frequently if you are over 40 years old.  Skin check.  Lung cancer screening. You may have this screening every year starting at age 65 if you have a 30-pack-year history of smoking and currently smoke or have quit within the past 15 years.  Fecal occult blood test (FOBT) of the stool. You may have this test every year starting at age 24.  Flexible sigmoidoscopy or  colonoscopy. You may have a sigmoidoscopy every 5 years or a colonoscopy every 10 years starting at age 70.  Prostate cancer screening. Recommendations will vary  depending on your family history and other risks.  Hepatitis C blood test.  Hepatitis B blood test.  Sexually transmitted disease (STD) testing.  Diabetes screening. This is done by checking your blood sugar (glucose) after you have not eaten for a while (fasting). You may have this done every 1-3 years.  Abdominal aortic aneurysm (AAA) screening. You may need this if you are a current or former smoker.  Osteoporosis. You may be screened starting at age 31 if you are at high risk. Talk with your health care provider about your test results, treatment options, and if necessary, the need for more tests. Vaccines  Your health care provider may recommend certain vaccines, such as:  Influenza vaccine. This is recommended every year.  Tetanus, diphtheria, and acellular pertussis (Tdap, Td) vaccine. You may need a Td booster every 10 years.  Zoster vaccine. You may need this after age 82.  Pneumococcal 13-valent conjugate (PCV13) vaccine. One dose is recommended after age 73.  Pneumococcal polysaccharide (PPSV23) vaccine. One dose is recommended after age 20. Talk to your health care provider about which screenings and vaccines you need and how often you need them. This information is not intended to replace advice given to you by your health care provider. Make sure you discuss any questions you have with your health care provider. Document Released: 02/13/2015 Document Revised: 10/07/2015 Document Reviewed: 11/18/2014 Elsevier Interactive Patient Education  2017 [place] Prevention in the Home Falls can cause injuries. They can happen to people of all ages. There are many things you can do to make your home safe and to help prevent falls. What can I do on the outside of my home?  Regularly fix the edges of walkways and driveways and fix any cracks.  Remove anything that might make you trip as you walk through a door, such as a raised step or threshold.  Trim any bushes  or trees on the path to your home.  Use bright outdoor lighting.  Clear any walking paths of anything that might make someone trip, such as rocks or tools.  Regularly check to see if handrails are loose or broken. Make sure that both sides of any steps have handrails.  Any raised decks and porches should have guardrails on the edges.  Have any leaves, snow, or ice cleared regularly.  Use sand or salt on walking paths during winter.  Clean up any spills in your garage right away. This includes oil or grease spills. What can I do in the bathroom?  Use night lights.  Install grab bars by the toilet and in the tub and shower. Do not use towel bars as grab bars.  Use non-skid mats or decals in the tub or shower.  If you need to sit down in the shower, use a plastic, non-slip stool.  Keep the floor dry. Clean up any water that spills on the floor as soon as it happens.  Remove soap buildup in the tub or shower regularly.  Attach bath mats securely with double-sided non-slip rug tape.  Do not have throw rugs and other things on the floor that can make you trip. What can I do in the bedroom?  Use night lights.  Make sure that you have a light by your bed that is easy to reach.  Do not use any  sheets or blankets that are too big for your bed. They should not hang down onto the floor.  Have a firm chair that has side arms. You can use this for support while you get dressed.  Do not have throw rugs and other things on the floor that can make you trip. What can I do in the kitchen?  Clean up any spills right away.  Avoid walking on wet floors.  Keep items that you use a lot in easy-to-reach places.  If you need to reach something above you, use a strong step stool that has a grab bar.  Keep electrical cords out of the way.  Do not use floor polish or wax that makes floors slippery. If you must use wax, use non-skid floor wax.  Do not have throw rugs and other things on  the floor that can make you trip. What can I do with my stairs?  Do not leave any items on the stairs.  Make sure that there are handrails on both sides of the stairs and use them. Fix handrails that are broken or loose. Make sure that handrails are as long as the stairways.  Check any carpeting to make sure that it is firmly attached to the stairs. Fix any carpet that is loose or worn.  Avoid having throw rugs at the top or bottom of the stairs. If you do have throw rugs, attach them to the floor with carpet tape.  Make sure that you have a light switch at the top of the stairs and the bottom of the stairs. If you do not have them, ask someone to add them for you. What else can I do to help prevent falls?  Wear shoes that:  Do not have high heels.  Have rubber bottoms.  Are comfortable and fit you well.  Are closed at the toe. Do not wear sandals.  If you use a stepladder:  Make sure that it is fully opened. Do not climb a closed stepladder.  Make sure that both sides of the stepladder are locked into place.  Ask someone to hold it for you, if possible.  Clearly mark and make sure that you can see:  Any grab bars or handrails.  First and last steps.  Where the edge of each step is.  Use tools that help you move around (mobility aids) if they are needed. These include:  Canes.  Walkers.  Scooters.  Crutches.  Turn on the lights when you go into a dark area. Replace any light bulbs as soon as they burn out.  Set up your furniture so you have a clear path. Avoid moving your furniture around.  If any of your floors are uneven, fix them.  If there are any pets around you, be aware of where they are.  Review your medicines with your doctor. Some medicines can make you feel dizzy. This can increase your chance of falling. Ask your doctor what other things that you can do to help prevent falls. This information is not intended to replace advice given to you by  your health care provider. Make sure you discuss any questions you have with your health care provider. Document Released: 11/13/2008 Document Revised: 06/25/2015 Document Reviewed: 02/21/2014 Elsevier Interactive Patient Education  2017 [employer].

## 2019-12-30 DIAGNOSIS — G4733 Obstructive sleep apnea (adult) (pediatric): Secondary | ICD-10-CM | POA: Diagnosis not present

## 2020-01-29 DIAGNOSIS — G4733 Obstructive sleep apnea (adult) (pediatric): Secondary | ICD-10-CM | POA: Diagnosis not present

## 2020-02-07 ENCOUNTER — Inpatient Hospital Stay: Payer: Medicare Other | Attending: Oncology | Admitting: Oncology

## 2020-02-07 ENCOUNTER — Telehealth: Payer: Self-pay

## 2020-02-07 ENCOUNTER — Telehealth: Payer: Self-pay | Admitting: Oncology

## 2020-02-07 ENCOUNTER — Inpatient Hospital Stay: Payer: Medicare Other

## 2020-02-07 ENCOUNTER — Other Ambulatory Visit: Payer: Self-pay

## 2020-02-07 VITALS — BP 132/89 | HR 71 | Temp 97.8°F | Resp 18 | Ht 70.0 in | Wt 213.0 lb

## 2020-02-07 DIAGNOSIS — D509 Iron deficiency anemia, unspecified: Secondary | ICD-10-CM | POA: Diagnosis not present

## 2020-02-07 DIAGNOSIS — I4891 Unspecified atrial fibrillation: Secondary | ICD-10-CM | POA: Insufficient documentation

## 2020-02-07 DIAGNOSIS — E785 Hyperlipidemia, unspecified: Secondary | ICD-10-CM | POA: Insufficient documentation

## 2020-02-07 DIAGNOSIS — C169 Malignant neoplasm of stomach, unspecified: Secondary | ICD-10-CM | POA: Diagnosis not present

## 2020-02-07 DIAGNOSIS — E119 Type 2 diabetes mellitus without complications: Secondary | ICD-10-CM | POA: Diagnosis not present

## 2020-02-07 DIAGNOSIS — Z85028 Personal history of other malignant neoplasm of stomach: Secondary | ICD-10-CM | POA: Insufficient documentation

## 2020-02-07 DIAGNOSIS — G4730 Sleep apnea, unspecified: Secondary | ICD-10-CM | POA: Insufficient documentation

## 2020-02-07 DIAGNOSIS — R970 Elevated carcinoembryonic antigen [CEA]: Secondary | ICD-10-CM | POA: Diagnosis not present

## 2020-02-07 DIAGNOSIS — Z923 Personal history of irradiation: Secondary | ICD-10-CM | POA: Diagnosis not present

## 2020-02-07 LAB — CEA (IN HOUSE-CHCC): CEA (CHCC-In House): 1.02 ng/mL (ref 0.00–5.00)

## 2020-02-07 NOTE — Progress Notes (Signed)
[facility] OFFICE PROGRESS NOTE   Diagnosis: Gastroesophageal cancer  INTERVAL HISTORY:   [name] was last seen at the cancer center in July 2020.  He reports feeling well.  No dysphagia.  His wife recently died unexpectedly.  He relates weight loss to this.  He is being followed by pulmonary medicine for sleep apnea.  Objective:  Vital signs in last 24 hours:  Blood pressure 132/89, pulse 71, temperature 97.8 F (36.6 C), temperature source Tympanic, resp. rate 18, height 5\' 10"  (1.778 m), weight 213 lb (96.6 kg), SpO2 98 %.    Lymphatics: No cervical, supraclavicular, axillary, or inguinal nodes Resp: End inspiratory rhonchi at the left posterior base, good air movement bilaterally, no respiratory distress Cardio: Distant heart sounds, regular rate and rhythm GI: No mass, nontender, no hepatosplenomegaly Vascular: No leg edema  Skin: 1 cm oval brown slightly raised mole with mild surrounding erythema at the right upper back  Portacath/PICC-without erythema  Lab Results:  Lab Results  Component Value Date   WBC 7.0 12/16/2019   HGB 15.4 12/16/2019   HCT 46.3 12/16/2019   MCV 85.8 12/16/2019   PLT 362.0 12/16/2019   NEUTROABS 3.2 12/16/2019    CMP  Lab Results  Component Value Date   NA 138 12/16/2019   K 4.0 12/16/2019   CL 102 12/16/2019   CO2 29 12/16/2019   GLUCOSE 95 12/16/2019   BUN 21 12/16/2019   CREATININE 1.44 12/16/2019   CALCIUM 9.5 12/16/2019   PROT 7.1 12/16/2019   ALBUMIN 4.5 12/16/2019   AST 26 12/16/2019   ALT 21 12/16/2019   ALKPHOS 49 12/16/2019   BILITOT 0.4 12/16/2019   GFRNONAA 45 (L) 11/23/2015   GFRAA 52 (L) 11/23/2015    Lab Results  Component Value Date   CEA1 <1.00 08/01/2019   Medications: I have reviewed the patient's current medications.   Assessment/Plan: 1. 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  Staging CT scan 08/27/2010 revealed small gastrohepatic ligament, periportal and retrocrural lymph nodes without other evidence of metastatic disease. Staging PET scan on 09/21/2010 showed abnormal malignant range FDG uptake at the site of the biopsy-proven gastric cancer. Small perigastric lymph nodes were not FDG avid above the background level. The radiologist commented that this may reflect a benign etiology or a false-negative appearance due to the small lymph node size. There was no evidence for distant metastatic disease. The CEA was markedly elevated at 482 on 09/14/2010. He completed radiation 09/20/2010 through 10/29/2010. He received concurrent weekly Taxol/carboplatin chemotherapy 09/22/2010 through 10/27/2010. Restaging PET scan 11/22/2010 showed interval resolution of previously demonstrated hypermetabolic activity within the proximal stomach. There was no evidence of metastatic disease. Restaging PET scan 05/10/2011 showed no evidence of gastroesophageal carcinoma recurrence or metastasis. 2. Status post upper endoscopy 12/17/2010-the very distal portion of the esophagus was ulcerated. The proximal stomach revealed a malignant appearing ulcerative mass in the region of the cardia/gastroesophageal junction. The overall bulk of the lesion was significantly improved but still quite abnormal and large. Multiple biopsies were taken. Pathology was negative for malignancy. 3. Elevated CEA (482) on 09/14/2010.  4. Dysphagia secondary to the lower esophagus/upper gastric tumor, resolved. 5. Microcytic anemia. The hemoglobin was normal on 03/31/2011. There was persistent red cell microcytosis. Hemoglobin and MCV are normal 6. Sleep apnea. 7. Early diabetes. 8. History of atrial fibrillation. 9. Hyperlipidemia. 10. History of colon polyps. 11. History of Helicobacter pylori infection  in 2011. 12. History of neutropenia secondary to chemotherapy, resolved. 13. CT of the chest on 02/15/2011 with left  lower lung density/air bronchograms-? Radiation change. 14. Colonoscopy 03/27/2013. Mild melanosis found throughout the entire examined colon. 2 diminutive polyps found in the descending colon (Tubular adenoma. No high-grade dysplasia or malignancy noted). Moderate diverticulosis in the left colon. Followup colonoscopy 06/20/2018- polyps removed from the descending and transverse colon, tubular adenomas 15. Nodular hyperpigmented mole at the right upper back .   Referred to dermatology 02/07/2020     Disposition: [name] is in clinical remission from gastroesophageal cancer.  We will follow up on the CEA from today.  He is now almost 10 years out from diagnosis.  He would like to continue follow-up at the Cancer center.  He will return for an office visit in 1 year.  I referred him to dermatology to evaluate the mole at the right upper back.  Betsy Coder, MD  02/07/2020  11:36 AM

## 2020-02-07 NOTE — Telephone Encounter (Signed)
Unable to reach patient regarding CEA results. Will attempt again

## 2020-02-07 NOTE — Telephone Encounter (Signed)
Scheduled appointments per 1/7 los. Spoke to patient who is aware of appointments dates and times.  

## 2020-02-11 ENCOUNTER — Telehealth: Payer: Self-pay | Admitting: *Deleted

## 2020-02-11 NOTE — Telephone Encounter (Signed)
-----   Message from Ladell Pier, MD sent at 02/07/2020  2:30 PM EST ----- Please call patient, the CEA is normal, follow-up as scheduled

## 2020-02-11 NOTE — Telephone Encounter (Signed)
Notified of normal CEA. F/U as scheduled. 

## 2020-02-12 ENCOUNTER — Other Ambulatory Visit: Payer: Self-pay | Admitting: Internal Medicine

## 2020-02-24 ENCOUNTER — Ambulatory Visit (INDEPENDENT_AMBULATORY_CARE_PROVIDER_SITE_OTHER): Payer: Medicare Other | Admitting: Adult Health

## 2020-02-24 ENCOUNTER — Other Ambulatory Visit: Payer: Self-pay

## 2020-02-24 ENCOUNTER — Encounter: Payer: Self-pay | Admitting: Adult Health

## 2020-02-24 DIAGNOSIS — G4733 Obstructive sleep apnea (adult) (pediatric): Secondary | ICD-10-CM | POA: Diagnosis not present

## 2020-02-24 DIAGNOSIS — E669 Obesity, unspecified: Secondary | ICD-10-CM | POA: Diagnosis not present

## 2020-02-24 NOTE — Patient Instructions (Signed)
Keep up the good work. Continue on C Pap at bedtime Do not drive if sleepy Remain active, healthy weight.  Follow up with Dr. Elsworth Soho or Dravyn Severs NP  In 1 year and As needed

## 2020-02-24 NOTE — Progress Notes (Signed)
@Patient  ID: Jon [name], male    DOB: [birth year], 75 y.o.   MRN: [id number]  Chief Complaint  Patient presents with  . Sleep Apnea    Referring provider: [name], MD  HPI: 75 year old male followed for obstructive sleep apnea Medical history significant for esophageal adenocarcinoma 2012 treated with chemoradiation complicated by left lower lobe radiation pneumonitis  TEST/EVENTS :  NPSG 2007: AHI 74/hr  02/24/2020 Follow up: OSA  Patient presents for a follow-up visit.  Patient has underlying severe obstructive sleep apnea.  He is on total CPAP.  He got a CPAP machine.  Says he is doing very well.  Feels it is working well.  He benefits from CPAP and has no significant daytime sleepiness.  Says he cannot sleep without his CPAP machine.  CPAP download shows excellent compliance with 100% usage.  Daily average usage at 6 hours.  Patient is on auto CPAP 8 to 15 cm H2O.  Daily average pressure at 11.9 cm H2O.  AHI 0.8.  Patient is using nasal pillows.  Patient says he remains very active. Covid vaccines x3 up-to-date.  No Known Allergies  Immunization History  Administered Date(s) Administered  . PFIZER(Purple Top)SARS-COV-2 Vaccination 03/23/2019, 04/16/2019, 12/23/2019  . Pneumococcal Conjugate-13 12/18/2012, 12/16/2019  . Pneumococcal Polysaccharide-23 03/15/2011, 03/21/2017  . Td 03/18/2004, 06/18/2014  . Zoster 12/31/2009  . Zoster Recombinat (Shingrix) 01/30/2017, 04/05/2017    Past Medical History:  Diagnosis Date  . Adenomatous colon polyp   . Anemia   . Atrial fibrillation ([place])   . Cataract   . Diabetes mellitus without complication ([place])   . Diverticulosis   . 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[place]) 2012   Gastroesophageal /radiation Tx and Chemo  . GERD (gastroesophageal reflux disease)   . Glaucoma   . H. pylori infection 2011  . Heart murmur   . History of radiation therapy 09/20/10 thru 10/29/10   gastroesophageal junction/gastric cardia  .  Hypertension   . Neuromuscular disorder ([place])   . Other and unspecified hyperlipidemia   . Sleep apnea    On CPAP  . Status post chemotherapy 09/22/10 thru 10/27/10   concurrent w/radiation  . Unspecified sleep apnea     Tobacco History: Social History   Tobacco Use  Smoking Status Former Smoker  . Packs/day: 1.00  . Years: 40.00  . Pack years: 40.00  . Types: Cigarettes  . Quit date: 01/31/2005  . Years since quitting: 15.0  Smokeless Tobacco Never Used   Counseling given: Not Answered   Outpatient Medications Prior to Visit  Medication Sig Dispense Refill  . aspirin 325 MG EC tablet Take 325 mg by mouth daily.    . Cholecalciferol 1000 UNITS tablet Take 1,000 Units by mouth daily.    . fenofibrate micronized (LOFIBRA) 134 MG capsule TAKE 1 CAPSULE BY MOUTH DAILY BEFORE BREAKFAST 90 capsule 3  . metFORMIN (GLUCOPHAGE) 1000 MG tablet TAKE 1 TABLET(1000 MG) BY MOUTH TWICE DAILY WITH A MEAL 180 tablet 3  . Multiple Vitamin (MULTIVITAMIN WITH MINERALS) TABS tablet Take 1 tablet by mouth daily.    . nateglinide (STARLIX) 120 MG tablet TAKE 1 TABLET(120 MG) BY MOUTH THREE TIMES DAILY WITH MEALS 270 tablet 3  . omeprazole (PRILOSEC) 40 MG capsule TAKE 1 CAPSULE BY MOUTH DAILY 90 capsule 2   No facility-administered medications prior to visit.     Review of Systems:   Constitutional:   No  weight loss, night sweats,  Fevers, chills, fatigue, or  lassitude.  HEENT:  No headaches,  Difficulty swallowing,  Tooth/dental problems, or  Sore throat,                No sneezing, itching, ear ache, nasal congestion, post nasal drip,   CV:  No chest pain,  Orthopnea, PND, swelling in lower extremities, anasarca, dizziness, palpitations, syncope.   GI  No heartburn, indigestion, abdominal pain, nausea, vomiting, diarrhea, change in bowel habits, loss of appetite, bloody stools.   Resp: No shortness of breath with exertion or at rest.  No excess mucus, no productive cough,  No  non-productive cough,  No coughing up of blood.  No change in color of mucus.  No wheezing.  No chest wall deformity  Skin: no rash or lesions.  GU: no dysuria, change in color of urine, no urgency or frequency.  No flank pain, no hematuria   MS:  No joint pain or swelling.  No decreased range of motion.  No back pain.    Physical Exam  BP 112/72 (BP Location: Left Arm, Cuff Size: Normal)   Pulse 61   Temp (!) 97.1 F (36.2 C) (Oral)   Ht 5' 10.5" (1.791 m)   Wt 219 lb 6.4 oz (99.5 kg)   SpO2 97%   BMI 31.04 kg/m   GEN: A/Ox3; pleasant , NAD, well nourished    HEENT:  [place]/AT,  , NOSE-clear, THROAT-clear, no lesions, no postnasal drip or exudate noted.   NECK:  Supple w/ fair ROM; no JVD; normal carotid impulses w/o bruits; no thyromegaly or nodules palpated; no lymphadenopathy.    RESP  Clear  P & A; w/o, wheezes/ rales/ or rhonchi. no accessory muscle use, no dullness to percussion  CARD:  RRR, no m/r/g, no peripheral edema, pulses intact, no cyanosis or clubbing.  GI:   Soft & nt; nml bowel sounds; no organomegaly or masses detected.   Musco: Warm bil, no deformities or joint swelling noted.   Neuro: alert, no focal deficits noted.    Skin: Warm, no lesions or rashes    Lab Results:   BMET   BNP No results found for: BNP  ProBNP No results found for: PROBNP  Imaging: No results found.    No flowsheet data found.  No results found for: NITRICOXIDE      Assessment & Plan:   Obstructive sleep apnea Excellent control and compliance on nocturnal CPAP  Plan  Patient Instructions  Keep up the good work. Continue on C Pap at bedtime Do not drive if sleepy Remain active, healthy weight.  Follow up with Dr. Elsworth Soho or [place] NP  In 1 year and As needed        Obesity (BMI 30.0-34.9) Work on healthy [employer], NP 02/24/2020

## 2020-02-24 NOTE — Assessment & Plan Note (Signed)
Work on healthy weight 

## 2020-02-24 NOTE — Assessment & Plan Note (Signed)
Excellent control and compliance on nocturnal CPAP  Plan  Patient Instructions  Keep up the good work. Continue on C Pap at bedtime Do not drive if sleepy Remain active, healthy weight.  Follow up with Dr. Elsworth Soho or Oluwatobi Visser NP  In 1 year and As needed

## 2020-02-29 DIAGNOSIS — G4733 Obstructive sleep apnea (adult) (pediatric): Secondary | ICD-10-CM | POA: Diagnosis not present

## 2020-03-31 DIAGNOSIS — G4733 Obstructive sleep apnea (adult) (pediatric): Secondary | ICD-10-CM | POA: Diagnosis not present

## 2020-04-01 DIAGNOSIS — G4733 Obstructive sleep apnea (adult) (pediatric): Secondary | ICD-10-CM | POA: Diagnosis not present

## 2020-04-23 DIAGNOSIS — H40023 Open angle with borderline findings, high risk, bilateral: Secondary | ICD-10-CM | POA: Diagnosis not present

## 2020-04-23 DIAGNOSIS — Z961 Presence of intraocular lens: Secondary | ICD-10-CM | POA: Diagnosis not present

## 2020-04-23 DIAGNOSIS — E119 Type 2 diabetes mellitus without complications: Secondary | ICD-10-CM | POA: Diagnosis not present

## 2020-04-23 DIAGNOSIS — H43811 Vitreous degeneration, right eye: Secondary | ICD-10-CM | POA: Diagnosis not present

## 2020-05-19 ENCOUNTER — Other Ambulatory Visit: Payer: Self-pay | Admitting: Internal Medicine

## 2020-05-21 DIAGNOSIS — G4733 Obstructive sleep apnea (adult) (pediatric): Secondary | ICD-10-CM | POA: Diagnosis not present

## 2020-06-24 ENCOUNTER — Ambulatory Visit: Payer: Medicare Other | Admitting: Internal Medicine

## 2020-07-03 ENCOUNTER — Ambulatory Visit (INDEPENDENT_AMBULATORY_CARE_PROVIDER_SITE_OTHER): Payer: Medicare Other | Admitting: Internal Medicine

## 2020-07-03 ENCOUNTER — Encounter: Payer: Self-pay | Admitting: Internal Medicine

## 2020-07-03 ENCOUNTER — Other Ambulatory Visit: Payer: Self-pay

## 2020-07-03 DIAGNOSIS — K2100 Gastro-esophageal reflux disease with esophagitis, without bleeding: Secondary | ICD-10-CM | POA: Diagnosis not present

## 2020-07-03 DIAGNOSIS — E119 Type 2 diabetes mellitus without complications: Secondary | ICD-10-CM

## 2020-07-03 DIAGNOSIS — E785 Hyperlipidemia, unspecified: Secondary | ICD-10-CM

## 2020-07-03 DIAGNOSIS — C169 Malignant neoplasm of stomach, unspecified: Secondary | ICD-10-CM | POA: Diagnosis not present

## 2020-07-03 DIAGNOSIS — D751 Secondary polycythemia: Secondary | ICD-10-CM | POA: Diagnosis not present

## 2020-07-03 LAB — CBC WITH DIFFERENTIAL/PLATELET
Basophils Absolute: 0 10*3/uL (ref 0.0–0.1)
Basophils Relative: 0.5 % (ref 0.0–3.0)
Eosinophils Absolute: 0.2 10*3/uL (ref 0.0–0.7)
Eosinophils Relative: 3.3 % (ref 0.0–5.0)
HCT: 45.9 % (ref 39.0–52.0)
Hemoglobin: 15.4 g/dL (ref 13.0–17.0)
Lymphocytes Relative: 32.4 % (ref 12.0–46.0)
Lymphs Abs: 2.3 10*3/uL (ref 0.7–4.0)
MCHC: 33.5 g/dL (ref 30.0–36.0)
MCV: 86.5 fl (ref 78.0–100.0)
Monocytes Absolute: 0.8 10*3/uL (ref 0.1–1.0)
Monocytes Relative: 12 % (ref 3.0–12.0)
Neutro Abs: 3.6 10*3/uL (ref 1.4–7.7)
Neutrophils Relative %: 51.8 % (ref 43.0–77.0)
Platelets: 353 10*3/uL (ref 150.0–400.0)
RBC: 5.31 Mil/uL (ref 4.22–5.81)
RDW: 15.1 % (ref 11.5–15.5)
WBC: 7 10*3/uL (ref 4.0–10.5)

## 2020-07-03 LAB — HEMOGLOBIN A1C: Hgb A1c MFr Bld: 6.8 % — ABNORMAL HIGH (ref 4.6–6.5)

## 2020-07-03 NOTE — Assessment & Plan Note (Signed)
On CPAP Check CBC

## 2020-07-03 NOTE — Progress Notes (Signed)
Subjective:  Patient ID: Jon [name], male    DOB: 11/28/1945  Age: 75 y.o. MRN: [id number]  CC: Follow-up (6 month check)   HPI Jon [name] presents for DM, dyslipidemia, GERD Wife died in 24-Jan-2020 Pt lost wt on diet  Outpatient Medications Prior to Visit  Medication Sig Dispense Refill  . aspirin 325 MG EC tablet Take 325 mg by mouth daily.    . Cholecalciferol 1000 UNITS tablet Take 1,000 Units by mouth daily.    . fenofibrate micronized (LOFIBRA) 134 MG capsule TAKE 1 CAPSULE BY MOUTH DAILY BEFORE BREAKFAST 90 capsule 3  . metFORMIN (GLUCOPHAGE) 1000 MG tablet TAKE 1 TABLET(1000 MG) BY MOUTH TWICE DAILY WITH A MEAL 180 tablet 3  . Multiple Vitamin (MULTIVITAMIN WITH MINERALS) TABS tablet Take 1 tablet by mouth daily.    . nateglinide (STARLIX) 120 MG tablet TAKE 1 TABLET(120 MG) BY MOUTH THREE TIMES DAILY WITH MEALS 270 tablet 3  . omeprazole (PRILOSEC) 40 MG capsule TAKE 1 CAPSULE BY MOUTH DAILY 90 capsule 2   No facility-administered medications prior to visit.    ROS: Review of Systems  Constitutional: Positive for unexpected weight change. Negative for appetite change and fatigue.  HENT: Negative for congestion, nosebleeds, sneezing, sore throat and trouble swallowing.   Eyes: Negative for itching and visual disturbance.  Respiratory: Negative for cough.   Cardiovascular: Negative for chest pain, palpitations and leg swelling.  Gastrointestinal: Negative for abdominal distention, blood in stool, diarrhea and nausea.  Genitourinary: Negative for frequency and hematuria.  Musculoskeletal: Negative for back pain, gait problem, joint swelling and neck pain.  Skin: Negative for rash.  Neurological: Negative for dizziness, tremors, speech difficulty and weakness.  Psychiatric/Behavioral: Negative for agitation, dysphoric mood, sleep disturbance and suicidal ideas. The patient is not nervous/anxious.     Objective:  BP 112/68   Pulse (!) 55   Temp 97.7 F  (36.5 C) (Oral)   Ht 5\' 10"  (1.778 m)   Wt 206 lb (93.4 kg)   SpO2 97%   BMI 29.56 kg/m   BP Readings from Last 3 Encounters:  07/03/20 112/68  02/24/20 112/72  02/07/20 132/89    Wt Readings from Last 3 Encounters:  07/03/20 206 lb (93.4 kg)  02/24/20 219 lb 6.4 oz (99.5 kg)  02/07/20 213 lb (96.6 kg)    Physical Exam Constitutional:      General: He is not in acute distress.    Appearance: He is well-developed.     Comments: NAD  Eyes:     Conjunctiva/sclera: Conjunctivae normal.     Pupils: Pupils are equal, round, and reactive to light.  Neck:     Thyroid: No thyromegaly.     Vascular: No JVD.  Cardiovascular:     Rate and Rhythm: Normal rate and regular rhythm.     Heart sounds: Normal heart sounds. No murmur heard. No friction rub. No gallop.   Pulmonary:     Effort: Pulmonary effort is normal. No respiratory distress.     Breath sounds: Normal breath sounds. No wheezing or rales.  Chest:     Chest wall: No tenderness.  Abdominal:     General: Bowel sounds are normal. There is no distension.     Palpations: Abdomen is soft. There is no mass.     Tenderness: There is no abdominal tenderness. There is no guarding or rebound.  Musculoskeletal:        General: No tenderness. Normal range of motion.  Cervical back: Normal range of motion.  Lymphadenopathy:     Cervical: No cervical adenopathy.  Skin:    General: Skin is warm and dry.     Findings: No rash.  Neurological:     Mental Status: He is alert and oriented to person, place, and time.     Cranial Nerves: No cranial nerve deficit.     Motor: No abnormal muscle tone.     Coordination: Coordination normal.     Gait: Gait normal.     Deep Tendon Reflexes: Reflexes are normal and symmetric.  Psychiatric:        Behavior: Behavior normal.        Thought Content: Thought content normal.        Judgment: Judgment normal.     Lab Results  Component Value Date   WBC 7.0 12/16/2019   HGB 15.4  12/16/2019   HCT 46.3 12/16/2019   PLT 362.0 12/16/2019   GLUCOSE 95 12/16/2019   CHOL 120 12/16/2019   TRIG 156.0 (H) 12/16/2019   HDL 37.80 (L) 12/16/2019   LDLDIRECT 44.0 06/10/2019   LDLCALC 51 12/16/2019   ALT 21 12/16/2019   AST 26 12/16/2019   NA 138 12/16/2019   K 4.0 12/16/2019   CL 102 12/16/2019   CREATININE 1.44 12/16/2019   BUN 21 12/16/2019   CO2 29 12/16/2019   TSH 0.38 06/10/2019   PSA 1.38 06/10/2019   HGBA1C 7.1 (H) 12/16/2019    VAS [place] LOWER EXTREMITY VENOUS (DVT)  Result Date: 11/01/2018  Lower Venous Study Indications: Swelling in the right lower leg without pain x 1 week. He denies SOB.  Risk Factors: None identified. Performing Technologist: Sharlett Iles RVT  Examination Guidelines: A complete evaluation includes B-mode imaging, spectral Doppler, color Doppler, and power Doppler as needed of all accessible portions of each vessel. Bilateral testing is considered an integral part of a complete examination. Limited examinations for reoccurring indications may be performed as noted.  +---------+---------------+---------+-----------+----------+--------------+ RIGHT    CompressibilityPhasicitySpontaneityPropertiesThrombus Aging +---------+---------------+---------+-----------+----------+--------------+ CFV      Full           Yes      Yes                                 +---------+---------------+---------+-----------+----------+--------------+ SFJ      Full           Yes      Yes                                 +---------+---------------+---------+-----------+----------+--------------+ FV Prox  Full           Yes      Yes                                 +---------+---------------+---------+-----------+----------+--------------+ FV Mid   Full                                                        +---------+---------------+---------+-----------+----------+--------------+ FV DistalFull           Yes      Yes                                  +---------+---------------+---------+-----------+----------+--------------+  PFV      Full           Yes      Yes                                 +---------+---------------+---------+-----------+----------+--------------+ POP      Full           Yes      Yes                                 +---------+---------------+---------+-----------+----------+--------------+ PTV      Full           Yes      Yes                                 +---------+---------------+---------+-----------+----------+--------------+ PERO     Full                    No                                  +---------+---------------+---------+-----------+----------+--------------+ Gastroc  Full                                                        +---------+---------------+---------+-----------+----------+--------------+ GSV      Full           Yes      Yes                                 +---------+---------------+---------+-----------+----------+--------------+   +----+---------------+---------+-----------+----------+--------------+ LEFTCompressibilityPhasicitySpontaneityPropertiesThrombus Aging +----+---------------+---------+-----------+----------+--------------+ CFV Full           Yes      Yes                                 +----+---------------+---------+-----------+----------+--------------+   Summary: Right: No evidence of deep vein thrombosis in the lower extremity. No indirect evidence of obstruction proximal to the inguinal ligament. 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  Left: No evidence of common femoral vein obstruction.  *See table(s) above for measurements and observations. Electronically signed by Kathlyn Sacramento MD on 11/01/2018 at 1:05:11 PM.    Final     Assessment & Plan:    Walker Kehr, MD

## 2020-07-03 NOTE — Assessment & Plan Note (Signed)
Cont w/ASA, fenofibrate

## 2020-07-03 NOTE — Assessment & Plan Note (Signed)
Cont w/Metformin, Starlix Check A1c

## 2020-07-03 NOTE — Assessment & Plan Note (Signed)
S/p Chemo and XRT F/u w/Dr Henrene Pastor

## 2020-07-03 NOTE — Assessment & Plan Note (Signed)
On Prilosec

## 2020-07-06 LAB — COMPREHENSIVE METABOLIC PANEL
ALT: 15 U/L (ref 0–53)
AST: 30 U/L (ref 0–37)
Albumin: 4.5 g/dL (ref 3.5–5.2)
Alkaline Phosphatase: 40 U/L (ref 39–117)
BUN: 24 mg/dL — ABNORMAL HIGH (ref 6–23)
CO2: 27 mEq/L (ref 19–32)
Calcium: 10 mg/dL (ref 8.4–10.5)
Chloride: 101 mEq/L (ref 96–112)
Creatinine, Ser: 1.33 mg/dL (ref 0.40–1.50)
GFR: 52.45 mL/min — ABNORMAL LOW (ref >60.00)
Glucose, Bld: 78 mg/dL (ref 70–99)
Potassium: 4.6 mEq/L (ref 3.5–5.1)
Sodium: 137 mEq/L (ref 135–145)
Total Bilirubin: 0.6 mg/dL (ref 0.2–1.2)
Total Protein: 7.2 g/dL (ref 6.0–8.3)

## 2020-07-20 ENCOUNTER — Ambulatory Visit (HOSPITAL_COMMUNITY)
Admission: EM | Admit: 2020-07-20 | Discharge: 2020-07-20 | Disposition: A | Discharge status: Home / Self Care | Payer: Medicare Other | Attending: Emergency Medicine | Admitting: Emergency Medicine

## 2020-07-20 ENCOUNTER — Other Ambulatory Visit: Payer: Self-pay

## 2020-07-20 ENCOUNTER — Encounter (HOSPITAL_COMMUNITY): Payer: Self-pay

## 2020-07-20 DIAGNOSIS — L509 Urticaria, unspecified: Secondary | ICD-10-CM | POA: Diagnosis not present

## 2020-07-20 MED ORDER — DIPHENHYDRAMINE HCL 25 MG PO TABS: 25.0000 mg | ORAL_TABLET | Freq: Four times a day (QID) | ORAL | 0 refills | Status: DC | PRN

## 2020-07-20 MED ORDER — METHYLPREDNISOLONE SODIUM SUCC 125 MG IJ SOLR
60.0000 mg | Freq: Once | INTRAMUSCULAR | Status: AC
  Administered 2020-07-20: 60 mg via INTRAMUSCULAR

## 2020-07-20 MED ORDER — LORATADINE 10 MG PO TABS: 10.0000 mg | ORAL_TABLET | Freq: Every day | ORAL | 0 refills | Status: DC

## 2020-07-20 MED ORDER — METHYLPREDNISOLONE SODIUM SUCC 125 MG IJ SOLR
INTRAMUSCULAR | Status: AC
  Filled 2020-07-20: qty 2

## 2020-07-20 NOTE — ED Triage Notes (Signed)
Pt presents with unknown rash on both arms, legs, and abdomen; pt states it started off itching and now it is no longer itching.

## 2020-07-20 NOTE — ED Provider Notes (Signed)
[facility]    CSN: [id number] Arrival date & time: 07/20/20  2778      History   Chief Complaint Chief Complaint  Patient presents with   Rash    HPI ZEV BLUE is a 75 y.o. male.   Patient presents with rash over bilateral arms, bilateral upper legs, and abdomen beginning yesterday afternoon after moving dusty box in garages. Rash began 1 hour after contact with dust. Was pruritic but has lessened greatly. Denies itchy throat, swollen throat, difficulty swallowing, shortness or breath or chest pain. Used alcohol and muscle relaxer spray over area with some relief.   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 pylori infection 2011   Heart murmur    History of radiation therapy 09/20/10 thru 10/29/10   gastroesophageal junction/gastric cardia   Hypertension    Neuromuscular disorder ([place])    Other and unspecified hyperlipidemia    Sleep apnea    On CPAP   Status post chemotherapy 09/22/10 thru 10/27/10   concurrent w/radiation   Unspecified sleep apnea     Patient Active Problem List   Diagnosis Date Noted   Obesity (BMI 30.0-34.9) 02/24/2020   Callus of heel 06/10/2019   Abnormal urine color 11/13/2018   Leg edema, right 10/30/2018   Coronary artery disease 03/28/2018   Paresthesia 03/28/2018   Polycythemia 12/21/2015   Erectile dysfunction 06/18/2013   Piriformis syndrome of right side 01/08/2013   Diabetes mellitus, type 2 ([place]) 02/16/2012   Shoulder pain, right 10/17/2011   Well adult exam 07/13/2011   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[place])    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[place])    Status post chemotherapy    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   Dysphagia  07/09/2010   Hypertension 07/09/2010   Neoplasm of uncertain behavior of skin 12/31/2009   CERUMEN IMPACTION 12/31/2009   HYPERGLYCEMIA 12/31/2009   VERTIGO 24/23/5361   HELICOBACTER PYLORI INFECTION 06/30/2009   GERD 06/16/2009   CHEST PAIN 06/16/2009   Obstructive sleep apnea 10/31/2008   POLYP, COLON 10/31/2007   Dyslipidemia 10/31/2007   PAF (paroxysmal atrial fibrillation) ([place]) 10/31/2007    Past Surgical History:  Procedure Laterality Date   CATARACT EXTRACTION     bilateral   COLONOSCOPY     ELBOW BURSA SURGERY     LEG SURGERY  1980   right leg    SKIN BIOPSY  08/22/11   shave biopsy =benign   TONSILLECTOMY         Home Medications    Prior to Admission medications   Medication Sig Start Date End Date Taking? Authorizing Provider  diphenhydrAMINE (BENADRYL) 25 MG tablet Take 1 tablet (25 mg total) by mouth every 6 (six) hours as needed. 07/20/20  Yes Noreen Mackintosh R, NP  loratadine (CLARITIN) 10 MG tablet Take 1 tablet (10 mg total) by mouth daily. 07/20/20  Yes Lowella Petties R, NP  aspirin 325 MG EC tablet Take 325 mg by mouth daily. 05/10/12   [provider]  Cholecalciferol 1000 UNITS tablet Take 1,000 Units by mouth daily.    [provider]  fenofibrate micronized (LOFIBRA) 134 MG capsule TAKE 1 CAPSULE BY MOUTH DAILY BEFORE  BREAKFAST 02/12/20   Plotnikov, Evie Lacks, MD  metFORMIN (GLUCOPHAGE) 1000 MG tablet TAKE 1 TABLET(1000 MG) BY MOUTH TWICE DAILY WITH A MEAL 08/16/19   Plotnikov, Evie Lacks, MD  Multiple Vitamin (MULTIVITAMIN WITH MINERALS) TABS tablet Take 1 tablet by mouth daily.    [provider]  nateglinide (STARLIX) 120 MG tablet TAKE 1 TABLET(120 MG) BY MOUTH THREE TIMES DAILY WITH MEALS 10/15/19   Plotnikov, Evie Lacks, MD  omeprazole (PRILOSEC) 40 MG capsule TAKE 1 CAPSULE BY MOUTH DAILY 05/19/20   Plotnikov, Evie Lacks, MD    Family History Family History  Problem Relation Age of Onset   Arthritis Mother    Diabetes  Father    Diabetes Brother    Coronary artery disease Neg Hx    Colon cancer Neg Hx    Esophageal cancer Neg Hx    Rectal cancer Neg Hx    Stomach cancer Neg Hx     Social History Social History   Tobacco Use   Smoking status: Former    Packs/day: 1.00    Years: 40.00    Pack years: 40.00    Types: Cigarettes    Quit date: 01/31/2005    Years since quitting: 15.4   Smokeless tobacco: Never  Substance Use Topics   Alcohol use: Yes    Alcohol/week: 0.0 standard drinks    Comment: 2 drinks last 6 months per pt   Drug use: No     Allergies   Patient has no known allergies.   Review of Systems Review of Systems Defer to HPI    Physical Exam Triage Vital Signs ED Triage Vitals  Enc Vitals Group     BP 07/20/20 1008 125/78     Pulse Rate 07/20/20 1008 (!) 59     Resp 07/20/20 1008 17     Temp 07/20/20 1008 98.6 F (37 C)     Temp Source 07/20/20 1008 Oral     SpO2 07/20/20 1008 97 %     Weight --      Height --      Head Circumference --      Peak Flow --      Pain Score 07/20/20 1010 0     Pain Loc --      Pain Edu? --      Excl. in [place]? --    No data found.  Updated Vital Signs BP 125/78 (BP Location: Left Arm)   Pulse (!) 59   Temp 98.6 F (37 C) (Oral)   Resp 17   SpO2 97%   Visual Acuity Right Eye Distance:   Left Eye Distance:   Bilateral Distance:    Right Eye Near:   Left Eye Near:    Bilateral Near:     Physical Exam Constitutional:      Appearance: Normal appearance. He is normal weight.  Eyes:     Extraocular Movements: Extraocular movements intact.  Pulmonary:     Effort: Pulmonary effort is normal.     Breath sounds: Normal breath sounds.  Musculoskeletal:        General: Normal range of motion.     Cervical back: Normal range of motion.  Skin:    Comments: Hives over bilateral arm, legs and abdomen   Neurological:     Mental Status: He is alert and oriented to person, place, and time.  Psychiatric:        Mood and  Affect: Mood normal.  Behavior: Behavior normal.     UC Treatments / Results  Labs (all labs ordered are listed, but only abnormal results are displayed) Labs Reviewed - No data to display  EKG   Radiology No results found.  Procedures Procedures (including critical care time)  Medications Ordered in UC Medications  methylPREDNISolone sodium succinate (SOLU-MEDROL) 125 mg/2 mL injection 60 mg (has no administration in time range)    Initial Impression / Assessment and Plan / UC Course  I have reviewed the triage vital signs and the nursing notes.  Pertinent labs & imaging results that were available during my care of the patient were reviewed by me and considered in my medical decision making (see chart for details).  Hives  Methylprednisolone 60 mg IM once Claritin 10 mg daily for 5 days, benadryl 25 mg every 6 hours in addition  Strict return precautions given  Final Clinical Impressions(s) / UC Diagnoses   Final diagnoses:  Hives     Discharge Instructions      Take Claritin daily for the next 5 days, Can use benadryl in addition every 6 hours, be mindful this benadryl may make you drowsy   If rash worsens or continues to spread, start to have difficulty breathing or swallow please follow up for reevaluation    ED Prescriptions     Medication Sig Dispense Auth. Provider   loratadine (CLARITIN) 10 MG tablet Take 1 tablet (10 mg total) by mouth daily. 14 tablet Marjo Grosvenor R, NP   diphenhydrAMINE (BENADRYL) 25 MG tablet Take 1 tablet (25 mg total) by mouth every 6 (six) hours as needed. 15 tablet Tiwana Chavis, Leitha Schuller, NP      PDMP not reviewed this encounter.   Hans Eden, NP 07/20/20 1039

## 2020-07-20 NOTE — Discharge Instructions (Addendum)
Take Claritin daily for the next 5 days, Can use benadryl in addition every 6 hours, be mindful this benadryl may make you drowsy   If rash worsens or continues to spread, start to have difficulty breathing or swallow please follow up for reevaluation

## 2020-08-10 ENCOUNTER — Other Ambulatory Visit: Payer: Self-pay | Admitting: Internal Medicine

## 2020-08-24 DIAGNOSIS — G4733 Obstructive sleep apnea (adult) (pediatric): Secondary | ICD-10-CM | POA: Diagnosis not present

## 2020-10-26 DIAGNOSIS — H524 Presbyopia: Secondary | ICD-10-CM | POA: Diagnosis not present

## 2020-10-26 DIAGNOSIS — H40023 Open angle with borderline findings, high risk, bilateral: Secondary | ICD-10-CM | POA: Diagnosis not present

## 2020-11-10 ENCOUNTER — Other Ambulatory Visit: Payer: Self-pay

## 2020-11-10 ENCOUNTER — Encounter: Payer: Self-pay | Admitting: Internal Medicine

## 2020-11-10 ENCOUNTER — Ambulatory Visit (INDEPENDENT_AMBULATORY_CARE_PROVIDER_SITE_OTHER): Payer: Medicare Other | Admitting: Internal Medicine

## 2020-11-10 DIAGNOSIS — K2100 Gastro-esophageal reflux disease with esophagitis, without bleeding: Secondary | ICD-10-CM

## 2020-11-10 DIAGNOSIS — E119 Type 2 diabetes mellitus without complications: Secondary | ICD-10-CM | POA: Diagnosis not present

## 2020-11-10 DIAGNOSIS — E785 Hyperlipidemia, unspecified: Secondary | ICD-10-CM

## 2020-11-10 DIAGNOSIS — C160 Malignant neoplasm of cardia: Secondary | ICD-10-CM

## 2020-11-10 DIAGNOSIS — D751 Secondary polycythemia: Secondary | ICD-10-CM | POA: Diagnosis not present

## 2020-11-10 LAB — HEMOGLOBIN A1C: Hgb A1c MFr Bld: 6.8 % — ABNORMAL HIGH (ref 4.6–6.5)

## 2020-11-10 LAB — LDL CHOLESTEROL, DIRECT: Direct LDL: 30 mg/dL

## 2020-11-10 LAB — COMPREHENSIVE METABOLIC PANEL
ALT: 17 U/L (ref 0–53)
AST: 25 U/L (ref 0–37)
Albumin: 4.5 g/dL (ref 3.5–5.2)
Alkaline Phosphatase: 43 U/L (ref 39–117)
BUN: 21 mg/dL (ref 6–23)
CO2: 26 mEq/L (ref 19–32)
Calcium: 9.6 mg/dL (ref 8.4–10.5)
Chloride: 101 mEq/L (ref 96–112)
Creatinine, Ser: 1.25 mg/dL (ref 0.40–1.50)
GFR: 56.36 mL/min — ABNORMAL LOW (ref >60.00)
Glucose, Bld: 94 mg/dL (ref 70–99)
Potassium: 4.1 mEq/L (ref 3.5–5.1)
Sodium: 138 mEq/L (ref 135–145)
Total Bilirubin: 0.6 mg/dL (ref 0.2–1.2)
Total Protein: 7 g/dL (ref 6.0–8.3)

## 2020-11-10 LAB — LIPID PANEL
Cholesterol: 101 mg/dL (ref 0–200)
HDL: 40.4 mg/dL (ref >39.00)
NonHDL: 60.53
Total CHOL/HDL Ratio: 2
Triglycerides: 201 mg/dL — ABNORMAL HIGH (ref 0.0–149.0)
VLDL: 40.2 mg/dL — ABNORMAL HIGH (ref 0.0–40.0)

## 2020-11-10 NOTE — Assessment & Plan Note (Signed)
Check CBC 

## 2020-11-10 NOTE — Assessment & Plan Note (Signed)
Cont on ASA, fenofibrate

## 2020-11-10 NOTE — Assessment & Plan Note (Signed)
Cont on Prilosec 

## 2020-11-10 NOTE — Addendum Note (Signed)
Addended by: Jacobo Forest on: 11/10/2020 10:17 AM   Modules accepted: Orders

## 2020-11-10 NOTE — Assessment & Plan Note (Signed)
Check CBC, LFTs

## 2020-11-10 NOTE — Progress Notes (Signed)
Subjective:  Patient ID: Jon [name], male    DOB: Dec 02, 1945  Age: 75 y.o. MRN: [id number]  CC: Follow-up (4 month f/u)   HPI Jon [name] presents for GERD, DM, primary adenocarcinoma of GE junction  Outpatient Medications Prior to Visit  Medication Sig Dispense Refill   aspirin 325 MG EC tablet Take 325 mg by mouth daily.     Cholecalciferol 1000 UNITS tablet Take 1,000 Units by mouth daily.     fenofibrate micronized (LOFIBRA) 134 MG capsule TAKE 1 CAPSULE BY MOUTH DAILY BEFORE BREAKFAST 90 capsule 3   metFORMIN (GLUCOPHAGE) 1000 MG tablet TAKE 1 TABLET(1000 MG) BY MOUTH TWICE DAILY WITH A MEAL 180 tablet 3   Multiple Vitamin (MULTIVITAMIN WITH MINERALS) TABS tablet Take 1 tablet by mouth daily.     nateglinide (STARLIX) 120 MG tablet TAKE 1 TABLET(120 MG) BY MOUTH THREE TIMES DAILY WITH MEALS 270 tablet 3   omeprazole (PRILOSEC) 40 MG capsule TAKE 1 CAPSULE BY MOUTH DAILY 90 capsule 2   diphenhydrAMINE (BENADRYL) 25 MG tablet Take 1 tablet (25 mg total) by mouth every 6 (six) hours as needed. (Patient not taking: Reported on 11/10/2020) 15 tablet 0   loratadine (CLARITIN) 10 MG tablet Take 1 tablet (10 mg total) by mouth daily. (Patient not taking: Reported on 11/10/2020) 14 tablet 0   No facility-administered medications prior to visit.    ROS: Review of Systems  Constitutional:  Negative for appetite change, fatigue and unexpected weight change.  HENT:  Negative for congestion, nosebleeds, sneezing, sore throat and trouble swallowing.   Eyes:  Negative for itching and visual disturbance.  Respiratory:  Negative for cough.   Cardiovascular:  Negative for chest pain, palpitations and leg swelling.  Gastrointestinal:  Negative for abdominal distention, blood in stool, diarrhea and nausea.  Genitourinary:  Negative for frequency and hematuria.  Musculoskeletal:  Negative for back pain, gait problem, joint swelling and neck pain.  Skin:  Negative for rash.   Neurological:  Negative for dizziness, tremors, speech difficulty and weakness.  Psychiatric/Behavioral:  Negative for agitation, dysphoric mood and sleep disturbance. The patient is not nervous/anxious.    Objective:  BP 120/72 (BP Location: Left Arm)   Pulse (!) 58   Temp 98.8 F (37.1 C) (Oral)   Ht 5\' 10"  (1.778 m)   Wt 209 lb 12.8 oz (95.2 kg)   SpO2 96%   BMI 30.10 kg/m   BP Readings from Last 3 Encounters:  11/10/20 120/72  07/20/20 125/78  07/03/20 112/68    Wt Readings from Last 3 Encounters:  11/10/20 209 lb 12.8 oz (95.2 kg)  07/03/20 206 lb (93.4 kg)  02/24/20 219 lb 6.4 oz (99.5 kg)    Physical Exam Constitutional:      General: He is not in acute distress.    Appearance: He is well-developed.     Comments: NAD  Eyes:     Conjunctiva/sclera: Conjunctivae normal.     Pupils: Pupils are equal, round, and reactive to light.  Neck:     Thyroid: No thyromegaly.     Vascular: No JVD.  Cardiovascular:     Rate and Rhythm: Regular rhythm. Bradycardia present.     Heart sounds: Normal heart sounds. No murmur heard.   No friction rub. No gallop.  Pulmonary:     Effort: Pulmonary effort is normal. No respiratory distress.     Breath sounds: Normal breath sounds. No wheezing or rales.  Chest:     Chest wall:  No tenderness.  Abdominal:     General: Bowel sounds are normal. There is no distension.     Palpations: Abdomen is soft. There is no mass.     Tenderness: There is no abdominal tenderness. There is no guarding or rebound.  Musculoskeletal:        General: No tenderness. Normal range of motion.     Cervical back: Normal range of motion.  Lymphadenopathy:     Cervical: No cervical adenopathy.  Skin:    General: Skin is warm and dry.     Findings: No rash.  Neurological:     Mental Status: He is alert and oriented to person, place, and time.     Cranial Nerves: No cranial nerve deficit.     Motor: No abnormal muscle tone.     Coordination:  Coordination normal.     Gait: Gait normal.     Deep Tendon Reflexes: Reflexes are normal and symmetric.  Psychiatric:        Behavior: Behavior normal.        Thought Content: Thought content normal.        Judgment: Judgment normal.    Lab Results  Component Value Date   WBC 7.0 07/03/2020   HGB 15.4 07/03/2020   HCT 45.9 07/03/2020   PLT 353.0 07/03/2020   GLUCOSE 78 07/03/2020   CHOL 120 12/16/2019   TRIG 156.0 (H) 12/16/2019   HDL 37.80 (L) 12/16/2019   LDLDIRECT 44.0 06/10/2019   LDLCALC 51 12/16/2019   ALT 15 07/03/2020   AST 30 07/03/2020   NA 137 07/03/2020   K 4.6 07/03/2020   CL 101 07/03/2020   CREATININE 1.33 07/03/2020   BUN 24 (H) 07/03/2020   CO2 27 07/03/2020   TSH 0.38 06/10/2019   PSA 1.38 06/10/2019   HGBA1C 6.8 (H) 07/03/2020    No results found.  Assessment & Plan:   Problem List Items Addressed This Visit     Diabetes mellitus, type 2 ([place])    Cont on Metformin, Starlix Check A1c      Relevant Orders   Lipid panel   Comprehensive metabolic panel   Hemoglobin A1c   Dyslipidemia    Cont on ASA, fenofibrate      Relevant Orders   Lipid panel   Comprehensive metabolic panel   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[facility])    Check CBC, LFTs      GERD    Cont on Prilosec      Polycythemia    Check CBC         Follow-up: Return in about 6 months (around 05/11/2021) for Wellness Exam.  Walker Kehr, MD

## 2020-11-10 NOTE — Assessment & Plan Note (Signed)
Cont on Metformin, Starlix Check A1c

## 2020-11-24 DIAGNOSIS — G4733 Obstructive sleep apnea (adult) (pediatric): Secondary | ICD-10-CM | POA: Diagnosis not present

## 2020-12-25 DIAGNOSIS — G4733 Obstructive sleep apnea (adult) (pediatric): Secondary | ICD-10-CM | POA: Diagnosis not present

## 2021-01-24 DIAGNOSIS — G4733 Obstructive sleep apnea (adult) (pediatric): Secondary | ICD-10-CM | POA: Diagnosis not present

## 2021-01-26 ENCOUNTER — Ambulatory Visit: Payer: Medicare Other

## 2021-02-06 ENCOUNTER — Other Ambulatory Visit: Payer: Self-pay | Admitting: Internal Medicine

## 2021-02-08 ENCOUNTER — Ambulatory Visit: Payer: Medicare Other | Attending: Internal Medicine

## 2021-02-08 ENCOUNTER — Inpatient Hospital Stay: Payer: Medicare Other

## 2021-02-08 ENCOUNTER — Encounter: Payer: Self-pay | Admitting: Oncology

## 2021-02-08 ENCOUNTER — Other Ambulatory Visit: Payer: Self-pay

## 2021-02-08 ENCOUNTER — Other Ambulatory Visit: Payer: Medicare Other

## 2021-02-08 ENCOUNTER — Inpatient Hospital Stay: Payer: Medicare Other | Attending: Oncology | Admitting: Oncology

## 2021-02-08 ENCOUNTER — Other Ambulatory Visit (HOSPITAL_BASED_OUTPATIENT_CLINIC_OR_DEPARTMENT_OTHER): Payer: Self-pay

## 2021-02-08 VITALS — BP 136/90 | HR 60 | Temp 97.7°F | Resp 18 | Ht 70.0 in | Wt 209.0 lb

## 2021-02-08 DIAGNOSIS — Z8501 Personal history of malignant neoplasm of esophagus: Secondary | ICD-10-CM | POA: Diagnosis not present

## 2021-02-08 DIAGNOSIS — I4891 Unspecified atrial fibrillation: Secondary | ICD-10-CM | POA: Diagnosis not present

## 2021-02-08 DIAGNOSIS — Z79899 Other long term (current) drug therapy: Secondary | ICD-10-CM | POA: Insufficient documentation

## 2021-02-08 DIAGNOSIS — D509 Iron deficiency anemia, unspecified: Secondary | ICD-10-CM | POA: Diagnosis not present

## 2021-02-08 DIAGNOSIS — E785 Hyperlipidemia, unspecified: Secondary | ICD-10-CM | POA: Insufficient documentation

## 2021-02-08 DIAGNOSIS — C169 Malignant neoplasm of stomach, unspecified: Secondary | ICD-10-CM | POA: Diagnosis not present

## 2021-02-08 DIAGNOSIS — R970 Elevated carcinoembryonic antigen [CEA]: Secondary | ICD-10-CM | POA: Diagnosis not present

## 2021-02-08 DIAGNOSIS — Z23 Encounter for immunization: Secondary | ICD-10-CM

## 2021-02-08 DIAGNOSIS — Z923 Personal history of irradiation: Secondary | ICD-10-CM | POA: Insufficient documentation

## 2021-02-08 DIAGNOSIS — E119 Type 2 diabetes mellitus without complications: Secondary | ICD-10-CM | POA: Insufficient documentation

## 2021-02-08 LAB — CEA (ACCESS): CEA (CHCC): 1.11 ng/mL (ref 0.00–5.00)

## 2021-02-08 MED ORDER — PFIZER COVID-19 VAC BIVALENT 30 MCG/0.3ML IM SUSP
INTRAMUSCULAR | 0 refills | Status: DC
  Filled 2021-02-08: qty 0.3, 1d supply, fill #0

## 2021-02-08 NOTE — Progress Notes (Signed)
[facility] OFFICE PROGRESS NOTE   Diagnosis: Gastroesophageal cancer  INTERVAL HISTORY:   Mr. Gribble returns as scheduled.  He feels well.  Good appetite.  No dysphagia.  No complaint.  Objective:  Vital signs in last 24 hours:  Blood pressure 136/90, pulse 60, temperature 97.7 F (36.5 C), temperature source Oral, resp. rate 18, height 5\' 10"  (1.778 m), weight 209 lb (94.8 kg), SpO2 98 %.    HEENT: Neck without mass Lymphatics: No cervical, supraclavicular, axillary, or inguinal nodes Resp: Lungs with end inspiratory rhonchi at the left greater than right posterior base, no respiratory distress Cardio: Regular rate and rhythm GI: No hepatosplenomegaly Vascular: No leg edema  Lab Results:  Lab Results  Component Value Date   WBC 7.0 07/03/2020   HGB 15.4 07/03/2020   HCT 45.9 07/03/2020   MCV 86.5 07/03/2020   PLT 353.0 07/03/2020   NEUTROABS 3.6 07/03/2020    CMP  Lab Results  Component Value Date   NA 138 11/10/2020   K 4.1 11/10/2020   CL 101 11/10/2020   CO2 26 11/10/2020   GLUCOSE 94 11/10/2020   BUN 21 11/10/2020   CREATININE 1.25 11/10/2020   CALCIUM 9.6 11/10/2020   PROT 7.0 11/10/2020   ALBUMIN 4.5 11/10/2020   AST 25 11/10/2020   ALT 17 11/10/2020   ALKPHOS 43 11/10/2020   BILITOT 0.6 11/10/2020   GFRNONAA 45 (L) 11/23/2015   GFRAA 52 (L) 11/23/2015    Lab Results  Component Value Date   CEA1 1.02 02/07/2020   CEA 1.1 03/16/2015    Medications: I have reviewed the patient's current medications.   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 Staging CT scan 08/27/2010 revealed small gastrohepatic ligament, periportal and retrocrural lymph nodes without other evidence of metastatic disease. Staging PET scan on 09/21/2010 showed abnormal malignant range FDG uptake at the site of the biopsy-proven gastric cancer. Small  perigastric lymph nodes were not FDG avid above the background level. The radiologist commented that this may reflect a benign etiology or a false-negative appearance due to the small lymph node size. There was no evidence for distant metastatic disease. The CEA was markedly elevated at 482 on 09/14/2010. He completed radiation 09/20/2010 through 10/29/2010. He received concurrent weekly Taxol/carboplatin chemotherapy 09/22/2010 through 10/27/2010. Restaging PET scan 11/22/2010 showed interval resolution of previously demonstrated hypermetabolic activity within the proximal stomach. There was no evidence of metastatic disease. Restaging PET scan 05/10/2011 showed no evidence of gastroesophageal carcinoma recurrence or metastasis. Status post upper endoscopy 12/17/2010-the very distal portion of the esophagus was ulcerated. The proximal stomach revealed a malignant appearing ulcerative mass in the region of the cardia/gastroesophageal junction. The overall bulk of the lesion was significantly improved but still quite abnormal and large. Multiple biopsies were taken. Pathology was negative for malignancy. Elevated CEA (482) on 09/14/2010.   Dysphagia secondary to the lower esophagus/upper gastric tumor, resolved. Microcytic anemia. The hemoglobin was normal on 03/31/2011. There was persistent red cell microcytosis. Hemoglobin and MCV are normal Sleep apnea. Early diabetes. History of atrial fibrillation. Hyperlipidemia. History of colon polyps. History of Helicobacter pylori infection in 2011. History of neutropenia secondary to chemotherapy, resolved. CT of the chest on 02/15/2011 with left lower lung density/air bronchograms-? Radiation change. Colonoscopy 03/27/2013. Mild melanosis found throughout the entire examined colon. 2 diminutive polyps found in the descending colon (Tubular adenoma. No high-grade dysplasia or malignancy noted). Moderate diverticulosis in  the left colon. Followup colonoscopy  06/20/2018- polyps removed from the descending and transverse colon, tubular adenomas Nodular hyperpigmented mole at the right upper back .   Referred to dermatology 02/07/2020      Disposition: Jon [name] is an remission from gastroesophageal cancer.  The CEA remains normal he would like continue follow-up with the Cancer center.  He will return for an office visit in 1 year.  He plans to obtain another COVID-19 vaccine.  Betsy Coder, MD  02/08/2021  8:50 AM

## 2021-02-08 NOTE — Progress Notes (Signed)
° °  Covid-19 Vaccination Clinic  Name:  Jon [name]    MRN: [id number] DOB: 03-26-1945  02/08/2021  Mr. Bellis was observed post Covid-19 immunization for 15 minutes without incident. He was provided with Vaccine Information Sheet and instruction to access the V-Safe system.   Mr. Sobol was instructed to call 911 with any severe reactions post vaccine: Difficulty breathing  Swelling of face and throat  A fast heartbeat  A bad rash all over body  Dizziness and weakness   Immunizations Administered     Name Date Dose VIS Date Route   Pfizer Covid-19 Vaccine Bivalent Booster 02/08/2021  9:13 AM 0.3 mL 09/30/2020 Intramuscular   Manufacturer: Rosalie   Lot: [id number]   [place]: [phone]

## 2021-02-10 ENCOUNTER — Encounter: Payer: Self-pay | Admitting: *Deleted

## 2021-02-10 NOTE — Progress Notes (Unsigned)
Faxed referral order, demographics and chart information to [facility] [phone].

## 2021-02-28 DIAGNOSIS — G4733 Obstructive sleep apnea (adult) (pediatric): Secondary | ICD-10-CM | POA: Diagnosis not present

## 2021-03-04 ENCOUNTER — Ambulatory Visit: Payer: Medicare Other | Admitting: Pulmonary Disease

## 2021-03-04 ENCOUNTER — Encounter: Payer: Self-pay | Admitting: Pulmonary Disease

## 2021-03-04 ENCOUNTER — Other Ambulatory Visit: Payer: Self-pay

## 2021-03-04 DIAGNOSIS — G4733 Obstructive sleep apnea (adult) (pediatric): Secondary | ICD-10-CM | POA: Diagnosis not present

## 2021-03-04 DIAGNOSIS — I10 Essential (primary) hypertension: Secondary | ICD-10-CM

## 2021-03-04 NOTE — Assessment & Plan Note (Signed)
CPAP download was reviewed which shows excellent compliance.  6.5 hours every night, good control of events on auto settings 8 to 15 cm with average pressure of 12.5 cm, maximum pressure 13.5 cm with moderate leak I have asked him to adjust nasal pillows, events are well controlled. He is very compliant and CPAP is certainly helped improve his daytime somnolence and fatigue  Weight loss encouraged, compliance with goal of at least 4-6 hrs every night is the expectation. Advised against medications with sedative side effects Cautioned against driving when sleepy - understanding that sleepiness will vary on a day to day basis

## 2021-03-04 NOTE — Patient Instructions (Signed)
CPAP is working well 

## 2021-03-04 NOTE — Assessment & Plan Note (Signed)
Controlled.  

## 2021-03-04 NOTE — Progress Notes (Signed)
° °  Subjective:    Patient ID: Jon [name], male    DOB: 1945-07-07, 76 y.o.   MRN: [id number]  HPI  76 yo for follow-up of OSA He underwent chemo-RT for esophageal adenoCA in 2012 ([facility]herrill & Henrene Pastor) complicated by LLL radiation pneumonitis     Chief Complaint  Patient presents with   Follow-up    1 year follow up. Pt states he still wears his cpap at night. Pt states that he wears the cpap all night long with no issues    He got a new machine in 2022.  His machine was for 14 years. It is quieter and he likes it.  He is settled down with nasal pillows. Pressure is okay, no dryness.  Wakes up feeling rested, no daytime somnolence. Blood pressure is controlled, no cardiac issues interim  Last PCP and oncology visits reviewed  Significant tests/ events reviewed  NPSG 2007:  AHI 74/hr  Review of Systems neg for any significant sore throat, dysphagia, itching, sneezing, nasal congestion or excess/ purulent secretions, fever, chills, sweats, unintended wt loss, pleuritic or exertional cp, hempoptysis, orthopnea pnd or change in chronic leg swelling. Also denies presyncope, palpitations, heartburn, abdominal pain, nausea, vomiting, diarrhea or change in bowel or urinary habits, dysuria,hematuria, rash, arthralgias, visual complaints, headache, numbness weakness or ataxia.     Objective:   Physical Exam  Gen. Pleasant, well-nourished, in no distress ENT - no thrush, no pallor/icterus,no post nasal drip Neck: No JVD, no thyromegaly, no carotid bruits Lungs: no use of accessory muscles, no dullness to percussion, clear without rales or rhonchi  Cardiovascular: Rhythm regular, heart sounds  normal, no murmurs or gallops, no peripheral edema Musculoskeletal: No deformities, no cyanosis or clubbing        Assessment & Plan:

## 2021-03-11 ENCOUNTER — Telehealth: Payer: Self-pay | Admitting: *Deleted

## 2021-03-11 NOTE — Telephone Encounter (Signed)
Left VM for patient to inquire if he has received an appointment yet re: referral to [facility] for hyperpigmented mole on back.

## 2021-03-12 ENCOUNTER — Telehealth: Payer: Self-pay | Admitting: *Deleted

## 2021-03-12 NOTE — Telephone Encounter (Signed)
Patient left VM requesting RN call back ID:[id number]. Called back and left voice mail again. Inquired if he has appointment w/[facility] yet?

## 2021-03-15 NOTE — Telephone Encounter (Signed)
Called patient and confirmed he did schedule an appointment at [facility].

## 2021-04-26 DIAGNOSIS — H40023 Open angle with borderline findings, high risk, bilateral: Secondary | ICD-10-CM | POA: Diagnosis not present

## 2021-04-26 DIAGNOSIS — Z961 Presence of intraocular lens: Secondary | ICD-10-CM | POA: Diagnosis not present

## 2021-04-26 DIAGNOSIS — E119 Type 2 diabetes mellitus without complications: Secondary | ICD-10-CM | POA: Diagnosis not present

## 2021-04-26 DIAGNOSIS — Q141 Congenital malformation of retina: Secondary | ICD-10-CM | POA: Diagnosis not present

## 2021-04-26 LAB — HM DIABETES EYE EXAM

## 2021-04-28 ENCOUNTER — Encounter: Payer: Self-pay | Admitting: Internal Medicine

## 2021-05-12 ENCOUNTER — Ambulatory Visit (INDEPENDENT_AMBULATORY_CARE_PROVIDER_SITE_OTHER): Payer: Medicare Other | Admitting: Internal Medicine

## 2021-05-12 ENCOUNTER — Encounter: Payer: Self-pay | Admitting: Internal Medicine

## 2021-05-12 DIAGNOSIS — I2510 Atherosclerotic heart disease of native coronary artery without angina pectoris: Secondary | ICD-10-CM

## 2021-05-12 DIAGNOSIS — C169 Malignant neoplasm of stomach, unspecified: Secondary | ICD-10-CM | POA: Diagnosis not present

## 2021-05-12 DIAGNOSIS — K2100 Gastro-esophageal reflux disease with esophagitis, without bleeding: Secondary | ICD-10-CM

## 2021-05-12 DIAGNOSIS — I2583 Coronary atherosclerosis due to lipid rich plaque: Secondary | ICD-10-CM

## 2021-05-12 DIAGNOSIS — E119 Type 2 diabetes mellitus without complications: Secondary | ICD-10-CM | POA: Diagnosis not present

## 2021-05-12 DIAGNOSIS — D751 Secondary polycythemia: Secondary | ICD-10-CM

## 2021-05-12 LAB — CBC WITH DIFFERENTIAL/PLATELET
Basophils Absolute: 0 10*3/uL (ref 0.0–0.1)
Basophils Relative: 0.6 % (ref 0.0–3.0)
Eosinophils Absolute: 0.3 10*3/uL (ref 0.0–0.7)
Eosinophils Relative: 3.9 % (ref 0.0–5.0)
HCT: 46.6 % (ref 39.0–52.0)
Hemoglobin: 15.3 g/dL (ref 13.0–17.0)
Lymphocytes Relative: 28.2 % (ref 12.0–46.0)
Lymphs Abs: 2 10*3/uL (ref 0.7–4.0)
MCHC: 32.9 g/dL (ref 30.0–36.0)
MCV: 88.3 fl (ref 78.0–100.0)
Monocytes Absolute: 0.8 10*3/uL (ref 0.1–1.0)
Monocytes Relative: 11.9 % (ref 3.0–12.0)
Neutro Abs: 3.9 10*3/uL (ref 1.4–7.7)
Neutrophils Relative %: 55.4 % (ref 43.0–77.0)
Platelets: 319 10*3/uL (ref 150.0–400.0)
RBC: 5.27 Mil/uL (ref 4.22–5.81)
RDW: 15 % (ref 11.5–15.5)
WBC: 7 10*3/uL (ref 4.0–10.5)

## 2021-05-12 LAB — COMPREHENSIVE METABOLIC PANEL
ALT: 21 U/L (ref 0–53)
AST: 32 U/L (ref 0–37)
Albumin: 4.7 g/dL (ref 3.5–5.2)
Alkaline Phosphatase: 41 U/L (ref 39–117)
BUN: 18 mg/dL (ref 6–23)
CO2: 30 mEq/L (ref 19–32)
Calcium: 9.7 mg/dL (ref 8.4–10.5)
Chloride: 101 mEq/L (ref 96–112)
Creatinine, Ser: 1.25 mg/dL (ref 0.40–1.50)
GFR: 56.16 mL/min — ABNORMAL LOW (ref >60.00)
Glucose, Bld: 104 mg/dL — ABNORMAL HIGH (ref 70–99)
Potassium: 4.1 mEq/L (ref 3.5–5.1)
Sodium: 139 mEq/L (ref 135–145)
Total Bilirubin: 0.6 mg/dL (ref 0.2–1.2)
Total Protein: 7.1 g/dL (ref 6.0–8.3)

## 2021-05-12 LAB — LIPID PANEL
Cholesterol: 125 mg/dL (ref 0–200)
HDL: 43.4 mg/dL (ref >39.00)
LDL Cholesterol: 56 mg/dL (ref 0–99)
NonHDL: 81.2
Total CHOL/HDL Ratio: 3
Triglycerides: 128 mg/dL (ref 0.0–149.0)
VLDL: 25.6 mg/dL (ref 0.0–40.0)

## 2021-05-12 LAB — HEMOGLOBIN A1C: Hgb A1c MFr Bld: 6.8 % — ABNORMAL HIGH (ref 4.6–6.5)

## 2021-05-12 NOTE — Assessment & Plan Note (Signed)
F/u w/Dr Henrene Pastor,  ?Dr Benay Spice in Jan ?

## 2021-05-12 NOTE — Assessment & Plan Note (Addendum)
On CPAP for OSA ?Check CBC ?

## 2021-05-12 NOTE — Assessment & Plan Note (Signed)
Cont on Metformin, Starlix ?Check A1c ?

## 2021-05-12 NOTE — Assessment & Plan Note (Signed)
Cont on fenofibrate. ?Check Lipids ?

## 2021-05-12 NOTE — Assessment & Plan Note (Signed)
Cont on Prilosec 

## 2021-05-12 NOTE — Progress Notes (Signed)
? ?Subjective:  ?Patient ID: Jon [name], male    DOB: January 26, 1946  Age: 76 y.o. MRN: [id number] ? ?CC: No chief complaint on file. ? ? ?HPI ?Jon [name] presents for DM, GERD, polycythemia ? ?Outpatient Medications Prior to Visit  ?Medication Sig Dispense Refill  ? aspirin 325 MG EC tablet Take 325 mg by mouth daily.    ? Cholecalciferol 1000 UNITS tablet Take 1,000 Units by mouth daily.    ? fenofibrate micronized (LOFIBRA) 134 MG capsule TAKE 1 CAPSULE BY MOUTH DAILY BEFORE AND BREAKFAST 90 capsule 3  ? metFORMIN (GLUCOPHAGE) 1000 MG tablet TAKE 1 TABLET(1000 MG) BY MOUTH TWICE DAILY WITH A MEAL 180 tablet 3  ? Multiple Vitamin (MULTIVITAMIN WITH MINERALS) TABS tablet Take 1 tablet by mouth daily.    ? nateglinide (STARLIX) 120 MG tablet TAKE 1 TABLET(120 MG) BY MOUTH THREE TIMES DAILY WITH MEALS 270 tablet 3  ? omeprazole (PRILOSEC) 40 MG capsule TAKE 1 CAPSULE BY MOUTH DAILY 90 capsule 3  ? COVID-19 mRNA bivalent vaccine, Pfizer, (PFIZER COVID-19 VAC BIVALENT) injection Inject into the muscle. 0.3 mL 0  ? ?No facility-administered medications prior to visit.  ? ? ?ROS: ?Review of Systems  ?Constitutional:  Negative for appetite change, fatigue and unexpected weight change.  ?HENT:  Negative for congestion, nosebleeds, sneezing, sore throat and trouble swallowing.   ?Eyes:  Negative for itching and visual disturbance.  ?Respiratory:  Negative for cough.   ?Cardiovascular:  Negative for chest pain, palpitations and leg swelling.  ?Gastrointestinal:  Negative for abdominal distention, blood in stool, diarrhea and nausea.  ?Genitourinary:  Negative for frequency and hematuria.  ?Musculoskeletal:  Negative for back pain, gait problem, joint swelling and neck pain.  ?Skin:  Negative for rash.  ?Neurological:  Negative for dizziness, tremors, speech difficulty and weakness.  ?Psychiatric/Behavioral:  Negative for agitation, dysphoric mood and sleep disturbance. The patient is not nervous/anxious.    ? ?Objective:  ?BP 102/78 (BP Location: Left Arm, Patient Position: Sitting, Cuff Size: Large)   Pulse (!) 52   Temp 98.4 ?F (36.9 ?C) (Oral)   Ht '5\' 10"'$  (1.778 m)   Wt 204 lb (92.5 kg)   SpO2 97%   BMI 29.27 kg/m?  ? ?BP Readings from Last 3 Encounters:  ?05/12/21 102/78  ?03/04/21 118/64  ?02/08/21 136/90  ? ? ?Wt Readings from Last 3 Encounters:  ?05/12/21 204 lb (92.5 kg)  ?03/04/21 207 lb 3.2 oz (94 kg)  ?02/08/21 209 lb (94.8 kg)  ? ? ?Physical Exam ?Constitutional:   ?   General: He is not in acute distress. ?   Appearance: He is well-developed.  ?   Comments: NAD  ?Eyes:  ?   Conjunctiva/sclera: Conjunctivae normal.  ?   Pupils: Pupils are equal, round, and reactive to light.  ?Neck:  ?   Thyroid: No thyromegaly.  ?   Vascular: No JVD.  ?Cardiovascular:  ?   Rate and Rhythm: Normal rate and regular rhythm.  ?   Heart sounds: Normal heart sounds. No murmur heard. ?  No friction rub. No gallop.  ?Pulmonary:  ?   Effort: Pulmonary effort is normal. No respiratory distress.  ?   Breath sounds: Normal breath sounds. No wheezing or rales.  ?Chest:  ?   Chest wall: No tenderness.  ?Abdominal:  ?   General: Bowel sounds are normal. There is no distension.  ?   Palpations: Abdomen is soft. There is no mass.  ?   Tenderness: There  is no abdominal tenderness. There is no guarding or rebound.  ?Musculoskeletal:     ?   General: No tenderness. Normal range of motion.  ?   Cervical back: Normal range of motion.  ?Lymphadenopathy:  ?   Cervical: No cervical adenopathy.  ?Skin: ?   General: Skin is warm and dry.  ?   Findings: No rash.  ?Neurological:  ?   Mental Status: He is alert and oriented to person, place, and time.  ?   Cranial Nerves: No cranial nerve deficit.  ?   Motor: No abnormal muscle tone.  ?   Coordination: Coordination normal.  ?   Gait: Gait normal.  ?   Deep Tendon Reflexes: Reflexes are normal and symmetric.  ?Psychiatric:     ?   Behavior: Behavior normal.     ?   Thought Content: Thought  content normal.     ?   Judgment: Judgment normal.  ? ? ?Lab Results  ?Component Value Date  ? WBC 7.0 07/03/2020  ? HGB 15.4 07/03/2020  ? HCT 45.9 07/03/2020  ? PLT 353.0 07/03/2020  ? GLUCOSE 94 11/10/2020  ? CHOL 101 11/10/2020  ? TRIG 201.0 (H) 11/10/2020  ? HDL 40.40 11/10/2020  ? LDLDIRECT 30.0 11/10/2020  ? [place] 51 12/16/2019  ? ALT 17 11/10/2020  ? AST 25 11/10/2020  ? NA 138 11/10/2020  ? K 4.1 11/10/2020  ? CL 101 11/10/2020  ? CREATININE 1.25 11/10/2020  ? BUN 21 11/10/2020  ? CO2 26 11/10/2020  ? TSH 0.38 06/10/2019  ? PSA 1.38 06/10/2019  ? HGBA1C 6.8 (H) 11/10/2020  ? ? ?No results found. ? ?Assessment & Plan:  ? ?Problem List Items Addressed This Visit   ? ? GERD  ?  Cont on Prilosec ?  ?  ? Primary adenocarcinoma of GE junction  ?  F/u w/Dr Henrene Pastor,  ?Dr Benay Spice in Jan ?  ?  ? Diabetes mellitus, type 2 ([place])  ?  Cont on Metformin, Starlix ?Check A1c ?  ?  ? Relevant Orders  ? Comprehensive metabolic panel  ? Lipid panel  ? Hemoglobin A1c  ? CBC with Differential/Platelet  ? Polycythemia  ?  On CPAP for OSA ?Check CBC ?  ?  ? Relevant Orders  ? Comprehensive metabolic panel  ? CBC with Differential/Platelet  ? Coronary artery disease  ?  Cont on fenofibrate. ?Check Lipids ?  ?  ? Relevant Orders  ? Lipid panel  ?  ? ? ?No orders of the defined types were placed in this encounter. ?  ? ? ?Follow-up: Return in about 6 months (around 11/11/2021) for Wellness Exam. ? ?Walker Kehr, MD ?

## 2021-06-02 DIAGNOSIS — G4733 Obstructive sleep apnea (adult) (pediatric): Secondary | ICD-10-CM | POA: Diagnosis not present

## 2021-06-16 DIAGNOSIS — D485 Neoplasm of uncertain behavior of skin: Secondary | ICD-10-CM | POA: Diagnosis not present

## 2021-06-16 DIAGNOSIS — D2261 Melanocytic nevi of right upper limb, including shoulder: Secondary | ICD-10-CM | POA: Diagnosis not present

## 2021-06-16 DIAGNOSIS — D1801 Hemangioma of skin and subcutaneous tissue: Secondary | ICD-10-CM | POA: Diagnosis not present

## 2021-06-16 DIAGNOSIS — D224 Melanocytic nevi of scalp and neck: Secondary | ICD-10-CM | POA: Diagnosis not present

## 2021-06-16 DIAGNOSIS — D235 Other benign neoplasm of skin of trunk: Secondary | ICD-10-CM | POA: Diagnosis not present

## 2021-07-03 DIAGNOSIS — G4733 Obstructive sleep apnea (adult) (pediatric): Secondary | ICD-10-CM | POA: Diagnosis not present

## 2021-07-08 DIAGNOSIS — G5602 Carpal tunnel syndrome, left upper limb: Secondary | ICD-10-CM | POA: Diagnosis not present

## 2021-08-02 DIAGNOSIS — G4733 Obstructive sleep apnea (adult) (pediatric): Secondary | ICD-10-CM | POA: Diagnosis not present

## 2021-08-04 ENCOUNTER — Other Ambulatory Visit: Payer: Self-pay | Admitting: Internal Medicine

## 2021-08-22 ENCOUNTER — Ambulatory Visit (HOSPITAL_COMMUNITY)
Admission: EM | Admit: 2021-08-22 | Discharge: 2021-08-22 | Disposition: A | Discharge status: Home / Self Care | Payer: Medicare Other | Attending: Emergency Medicine | Admitting: Emergency Medicine

## 2021-08-22 ENCOUNTER — Encounter (HOSPITAL_COMMUNITY): Payer: Self-pay

## 2021-08-22 DIAGNOSIS — L509 Urticaria, unspecified: Secondary | ICD-10-CM | POA: Diagnosis not present

## 2021-08-22 MED ORDER — EPINEPHRINE 0.3 MG/0.3ML IJ SOAJ: 0.3000 mg | INTRAMUSCULAR | 0 refills | Status: AC | PRN

## 2021-08-22 MED ORDER — METHYLPREDNISOLONE SODIUM SUCC 125 MG IJ SOLR
125.0000 mg | Freq: Once | INTRAMUSCULAR | Status: AC
  Administered 2021-08-22: 125 mg via INTRAMUSCULAR

## 2021-08-22 MED ORDER — DIPHENHYDRAMINE HCL 50 MG/ML IJ SOLN
INTRAMUSCULAR | Status: AC
  Filled 2021-08-22: qty 1

## 2021-08-22 MED ORDER — METHYLPREDNISOLONE ACETATE 40 MG/ML IJ SUSP
INTRAMUSCULAR | Status: AC
  Filled 2021-08-22: qty 1

## 2021-08-22 MED ORDER — PREDNISONE 10 MG (21) PO TBPK: ORAL_TABLET | Freq: Every day | ORAL | 0 refills | Status: DC

## 2021-08-22 MED ORDER — METHYLPREDNISOLONE SODIUM SUCC 125 MG IJ SOLR
INTRAMUSCULAR | Status: AC
  Filled 2021-08-22: qty 2

## 2021-08-22 MED ORDER — DIPHENHYDRAMINE HCL 12.5 MG/5ML PO ELIX
25.0000 mg | ORAL_SOLUTION | Freq: Once | ORAL | Status: AC
  Administered 2021-08-22: 25 mg via ORAL

## 2021-08-22 MED ORDER — EPINEPHRINE 0.3 MG/0.3ML IJ SOAJ: 0.3000 mg | INTRAMUSCULAR | 0 refills | Status: DC | PRN

## 2021-08-22 NOTE — Discharge Instructions (Addendum)
Today you are being treated for an allergic reaction, most likely to dust as this has occurred before  You have been given an injection of steroids today in the office today to help reduce the inflammatory process that occurs with this rash which will help minimize your itching as well as begin to clear  Starting tomorrow take prednisone every morning with food as directed, to continue the above process  You may continue use of topical calamine or Benadryl cream to help manage itching, you may also continue oral Benadryl  Please avoid long exposures to heat such as a hot steamy shower or being outside as this may cause further irritation to your rash  You may follow-up with his urgent care as needed if symptoms persist or worsen

## 2021-08-22 NOTE — ED Triage Notes (Signed)
Patient presents to Urgent Care with complaints of allergic reaction since 1 hour ago. Patient reports he is unsure what caused this reaction.

## 2021-08-22 NOTE — ED Provider Notes (Addendum)
[facility]    CSN: [id number] Arrival date & time: 08/22/21  1701      History   Chief Complaint No chief complaint on file.   HPI Jon [name] is a 76 y.o. male.   Patient presents with hives after unknown reaction.  Hives initially began on the lower abdomen but has spread to the bilateral arms, posterior neck and chest.  Was moving a TV when symptoms began.  Has eaten a hamburger and cereal at home which she has eaten before.  Denies any allergies.  Endorses that similar symptoms have been 9 years ago due to an unknown cause and he was given an EpiPen at that time, issue since.  Denies shortness of breath, wheezing, cough, itchy throat, difficulty swallowing, visual disturbance.  Past Medical History:  Diagnosis Date   Adenomatous colon polyp    Anemia    Atrial fibrillation ([place])    Cataract    Diabetes mellitus without complication ([place])    Diverticulosis    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[place]) 2012   Gastroesophageal /radiation Tx and Chemo   GERD (gastroesophageal reflux disease)    Glaucoma    H. pylori infection 2011   Heart murmur    History of radiation therapy 09/20/10 thru 10/29/10   gastroesophageal junction/gastric cardia   Hypertension    Neuromuscular disorder ([place])    Other and unspecified hyperlipidemia    Sleep apnea    On CPAP   Status post chemotherapy 09/22/10 thru 10/27/10   concurrent w/radiation   Unspecified sleep apnea     Patient Active Problem List   Diagnosis Date Noted   Obesity (BMI 30.0-34.9) 02/24/2020   Callus of heel 06/10/2019   Abnormal urine color 11/13/2018   Leg edema, right 10/30/2018   Coronary artery disease 03/28/2018   Paresthesia 03/28/2018   Polycythemia 12/21/2015   Erectile dysfunction 06/18/2013   Piriformis syndrome of right side 01/08/2013   Diabetes mellitus, type 2 ([place]) 02/16/2012   Shoulder pain, right 10/17/2011   Well adult exam 07/13/2011   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[place])    Radiation  pneumonitis ([place])    Status post chemotherapy    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   Dysphagia 07/09/2010   Hypertension 07/09/2010   Neoplasm of uncertain behavior of skin 12/31/2009   CERUMEN IMPACTION 12/31/2009   HYPERGLYCEMIA 12/31/2009   VERTIGO 40/09/6759   HELICOBACTER PYLORI INFECTION 06/30/2009   GERD 06/16/2009   CHEST PAIN 06/16/2009   Obstructive sleep apnea 10/31/2008   POLYP, COLON 10/31/2007   Dyslipidemia 10/31/2007   PAF (paroxysmal atrial fibrillation) ([place]) 10/31/2007    Past Surgical History:  Procedure Laterality Date   CATARACT EXTRACTION     bilateral   COLONOSCOPY     ELBOW BURSA SURGERY     LEG SURGERY  1980   right leg    SKIN BIOPSY  08/22/11   shave biopsy =benign   TONSILLECTOMY         Home Medications    Prior to Admission medications   Medication Sig Start Date End Date Taking? Authorizing Provider  aspirin 325 MG EC tablet Take 325 mg by mouth daily. 05/10/12   [provider]  Cholecalciferol 1000 UNITS tablet Take 1,000 Units by mouth daily.    [provider]  fenofibrate micronized (LOFIBRA) 134 MG capsule TAKE 1 CAPSULE BY MOUTH DAILY BEFORE AND BREAKFAST 02/08/21   Plotnikov, Evie Lacks, MD  metFORMIN (GLUCOPHAGE) 1000 MG tablet TAKE 1 TABLET(1000 MG) BY MOUTH TWICE DAILY  WITH A MEAL 08/04/21   Plotnikov, Evie Lacks, MD  Multiple Vitamin (MULTIVITAMIN WITH MINERALS) TABS tablet Take 1 tablet by mouth daily.    [provider]  nateglinide (STARLIX) 120 MG tablet TAKE 1 TABLET(120 MG) BY MOUTH THREE TIMES DAILY WITH MEALS 08/10/20   Plotnikov, Evie Lacks, MD  omeprazole (PRILOSEC) 40 MG capsule TAKE 1 CAPSULE BY MOUTH DAILY 02/08/21   Plotnikov, Evie Lacks, MD    Family History Family History  Problem Relation Age of Onset   Arthritis Mother    Diabetes Father    Diabetes Brother    Coronary artery disease Neg Hx    Colon cancer Neg Hx    Esophageal cancer Neg Hx    Rectal cancer Neg Hx     Stomach cancer Neg Hx     Social History Social History   Tobacco Use   Smoking status: Former    Packs/day: 1.00    Years: 40.00    Total pack years: 40.00    Types: Cigarettes    Quit date: 01/31/2005    Years since quitting: 16.5   Smokeless tobacco: Never  Substance Use Topics   Alcohol use: Yes    Alcohol/week: 0.0 standard drinks of alcohol    Comment: 2 drinks last 6 months per pt   Drug use: No     Allergies   Patient has no known allergies.   Review of Systems Review of Systems  Constitutional: Negative.   Respiratory: Negative.    Cardiovascular: Negative.   Skin:  Positive for rash. Negative for color change, pallor and wound.  Neurological: Negative.      Physical Exam Triage Vital Signs ED Triage Vitals  Enc Vitals Group     BP 08/22/21 1703 (!) 120/57     Pulse Rate 08/22/21 1703 76     Resp 08/22/21 1703 18     Temp 08/22/21 1703 98.1 F (36.7 C)     Temp src --      SpO2 08/22/21 1703 93 %     Weight --      Height --      Head Circumference --      Peak Flow --      Pain Score 08/22/21 1705 0     Pain Loc --      Pain Edu? --      Excl. in [place]? --    No data found.  Updated Vital Signs BP (!) 120/57   Pulse 76   Temp 98.1 F (36.7 C)   Resp 18   SpO2 93%   Visual Acuity Right Eye Distance:   Left Eye Distance:   Bilateral Distance:    Right Eye Near:   Left Eye Near:    Bilateral Near:     Physical Exam Constitutional:      Appearance: Normal appearance.  HENT:     Head: Normocephalic.     Mouth/Throat:     Mouth: Mucous membranes are moist.     Pharynx: Oropharynx is clear.  Eyes:     Extraocular Movements: Extraocular movements intact.  Cardiovascular:     Rate and Rhythm: Normal rate and regular rhythm.     Pulses: Normal pulses.     Heart sounds: Normal heart sounds.  Pulmonary:     Effort: Pulmonary effort is normal.     Breath sounds: Normal breath sounds.  Skin:    Comments: Hives present to the  bilateral arms, chest, neck and the abdomen  Neurological:     Mental Status: He is alert and oriented to person, place, and time. Mental status is at baseline.      UC Treatments / Results  Labs (all labs ordered are listed, but only abnormal results are displayed) Labs Reviewed - No data to display  EKG   Radiology No results found.  Procedures Procedures (including critical care time)  Medications Ordered in UC Medications  methylPREDNISolone sodium succinate (SOLU-MEDROL) 125 mg/2 mL injection 125 mg (has no administration in time range)  diphenhydrAMINE (BENADRYL) 12.5 MG/5ML elixir 25 mg (has no administration in time range)    Initial Impression / Assessment and Plan / UC Course  I have reviewed the triage vital signs and the nursing notes.  Pertinent labs & imaging results that were available during my care of the patient were reviewed by me and considered in my medical decision making (see chart for details).  Hives  Allergies most likely to dust, per chart review patient had similar event 1 year ago after moving boxes, discussed with patient, methylprednisolone injection and Benadryl given in office,  pharynx is clear and lungs are clear to auscultation, patient is in no signs of distress, prednisone 60 mg taper prescribed for outpatient and EpiPen prescribed as well due to unknown allergy, given strict precautions that if any signs of respiratory distress begin to use to give EpiPen and go to the nearest emergency department for further evaluation Final Clinical Impressions(s) / UC Diagnoses   Final diagnoses:  None   Discharge Instructions   None    ED Prescriptions   None    PDMP not reviewed this encounter.   Hans Eden, NP 08/22/21 1711    Hans Eden, NP 08/22/21 [phone]

## 2021-08-27 DIAGNOSIS — G5602 Carpal tunnel syndrome, left upper limb: Secondary | ICD-10-CM | POA: Diagnosis not present

## 2021-08-30 ENCOUNTER — Encounter (HOSPITAL_COMMUNITY): Payer: Self-pay

## 2021-08-30 ENCOUNTER — Emergency Department (HOSPITAL_COMMUNITY)
Admission: EM | Admit: 2021-08-30 | Discharge: 2021-08-30 | Disposition: A | Discharge status: Home / Self Care | Payer: Medicare Other | Attending: Emergency Medicine | Admitting: Emergency Medicine

## 2021-08-30 ENCOUNTER — Ambulatory Visit (HOSPITAL_COMMUNITY)
Admission: EM | Admit: 2021-08-30 | Discharge: 2021-08-30 | Disposition: A | Discharge status: Home / Self Care | Payer: Medicare Other | Attending: Family Medicine | Admitting: Family Medicine

## 2021-08-30 ENCOUNTER — Emergency Department (HOSPITAL_COMMUNITY): Payer: Medicare Other

## 2021-08-30 DIAGNOSIS — R110 Nausea: Secondary | ICD-10-CM | POA: Diagnosis not present

## 2021-08-30 DIAGNOSIS — I10 Essential (primary) hypertension: Secondary | ICD-10-CM | POA: Insufficient documentation

## 2021-08-30 DIAGNOSIS — Z7982 Long term (current) use of aspirin: Secondary | ICD-10-CM | POA: Insufficient documentation

## 2021-08-30 DIAGNOSIS — R519 Headache, unspecified: Secondary | ICD-10-CM

## 2021-08-30 DIAGNOSIS — E119 Type 2 diabetes mellitus without complications: Secondary | ICD-10-CM | POA: Insufficient documentation

## 2021-08-30 DIAGNOSIS — H53149 Visual discomfort, unspecified: Secondary | ICD-10-CM | POA: Diagnosis not present

## 2021-08-30 DIAGNOSIS — Z79899 Other long term (current) drug therapy: Secondary | ICD-10-CM | POA: Insufficient documentation

## 2021-08-30 DIAGNOSIS — Z7984 Long term (current) use of oral hypoglycemic drugs: Secondary | ICD-10-CM | POA: Insufficient documentation

## 2021-08-30 LAB — BASIC METABOLIC PANEL
Anion gap: 10 (ref 5–15)
BUN: 16 mg/dL (ref 8–23)
CO2: 28 mmol/L (ref 22–32)
Calcium: 9.2 mg/dL (ref 8.9–10.3)
Chloride: 95 mmol/L — ABNORMAL LOW (ref 98–111)
Creatinine, Ser: 1.1 mg/dL (ref 0.61–1.24)
GFR, Estimated: >60 mL/min (ref >60)
Glucose, Bld: 127 mg/dL — ABNORMAL HIGH (ref 70–99)
Potassium: 4.7 mmol/L (ref 3.5–5.1)
Sodium: 133 mmol/L — ABNORMAL LOW (ref 135–145)

## 2021-08-30 LAB — CBC
HCT: 47.9 % (ref 39.0–52.0)
Hemoglobin: 16 g/dL (ref 13.0–17.0)
MCH: 29.1 pg (ref 26.0–34.0)
MCHC: 33.4 g/dL (ref 30.0–36.0)
MCV: 87.2 fL (ref 80.0–100.0)
Platelets: 371 10*3/uL (ref 150–400)
RBC: 5.49 MIL/uL (ref 4.22–5.81)
RDW: 14.3 % (ref 11.5–15.5)
WBC: 13.3 10*3/uL — ABNORMAL HIGH (ref 4.0–10.5)
nRBC: 0 % (ref 0.0–0.2)

## 2021-08-30 LAB — CBG MONITORING, ED: Glucose-Capillary: 112 mg/dL — ABNORMAL HIGH (ref 70–99)

## 2021-08-30 MED ORDER — ONDANSETRON 4 MG PO TBDP
ORAL_TABLET | ORAL | Status: AC
  Filled 2021-08-30: qty 1

## 2021-08-30 MED ORDER — METOCLOPRAMIDE HCL 5 MG/ML IJ SOLN
5.0000 mg | Freq: Once | INTRAMUSCULAR | Status: AC
Start: 2021-08-30 — End: 2021-08-30
  Administered 2021-08-30: 5 mg via INTRAMUSCULAR

## 2021-08-30 MED ORDER — DEXAMETHASONE SODIUM PHOSPHATE 10 MG/ML IJ SOLN
10.0000 mg | Freq: Once | INTRAMUSCULAR | Status: AC
Start: 2021-08-30 — End: 2021-08-30
  Administered 2021-08-30: 10 mg via INTRAMUSCULAR

## 2021-08-30 MED ORDER — KETOROLAC TROMETHAMINE 30 MG/ML IJ SOLN
INTRAMUSCULAR | Status: AC
  Filled 2021-08-30: qty 1

## 2021-08-30 MED ORDER — KETOROLAC TROMETHAMINE 30 MG/ML IJ SOLN
30.0000 mg | Freq: Once | INTRAMUSCULAR | Status: AC
Start: 2021-08-30 — End: 2021-08-30
  Administered 2021-08-30: 30 mg via INTRAMUSCULAR

## 2021-08-30 MED ORDER — ACETAMINOPHEN 500 MG PO TABS
1000.0000 mg | ORAL_TABLET | Freq: Once | ORAL | Status: AC
  Administered 2021-08-30: 1000 mg via ORAL
  Filled 2021-08-30: qty 2

## 2021-08-30 MED ORDER — ONDANSETRON 4 MG PO TBDP
4.0000 mg | ORAL_TABLET | Freq: Once | ORAL | Status: AC
  Administered 2021-08-30: 4 mg via ORAL

## 2021-08-30 MED ORDER — METOCLOPRAMIDE HCL 5 MG/ML IJ SOLN
INTRAMUSCULAR | Status: AC
  Filled 2021-08-30: qty 2

## 2021-08-30 MED ORDER — DEXAMETHASONE SODIUM PHOSPHATE 10 MG/ML IJ SOLN
INTRAMUSCULAR | Status: AC
  Filled 2021-08-30: qty 1

## 2021-08-30 NOTE — ED Provider Notes (Signed)
[facility] EMERGENCY DEPARTMENT Provider Note   CSN: [id number] Arrival date & time: 08/30/21  1441     History {Add pertinent medical, surgical, social history, OB history to HPI:1} Chief Complaint  Patient presents with   Headache   Hypertension   Nausea    Jon [name] is a 76 y.o. male.  With a past medical history of diabetes, hypertension, who presents to the emergency department with gradual onset headache over the past 24 hours.  The patient states that he had woken up with a headache approximately 0300 the prior evening and had ultimately presented to urgent care today and was subsequently instructed to come to the emergency department for evaluation.  The patient describes the headache as throbbing and mostly behind his right eye.  He takes no blood thinning medication has had no associated fever, chills, or recent trauma.  There is associated photophobia without phonophobia.  He has had some nausea however has not had any emesis.  He is mentating at baseline per his son in the room.   Headache Associated symptoms: nausea   Associated symptoms: no abdominal pain, no back pain, no cough, no ear pain, no eye pain, no fever, no seizures, no sore throat and no vomiting   Hypertension Associated symptoms include headaches. Pertinent negatives include no chest pain, no abdominal pain and no shortness of breath.       Home Medications Prior to Admission medications   Medication Sig Start Date End Date Taking? Authorizing Provider  aspirin 325 MG EC tablet Take 325 mg by mouth daily. 05/10/12   [provider]  Cholecalciferol 1000 UNITS tablet Take 1,000 Units by mouth daily.    [provider]  EPINEPHrine 0.3 mg/0.3 mL IJ SOAJ injection Inject 0.3 mg into the muscle as needed for anaphylaxis. 08/22/21   White, Leitha Schuller, NP  fenofibrate micronized (LOFIBRA) 134 MG capsule TAKE 1 CAPSULE BY MOUTH DAILY BEFORE AND BREAKFAST 02/08/21    Plotnikov, Evie Lacks, MD  metFORMIN (GLUCOPHAGE) 1000 MG tablet TAKE 1 TABLET(1000 MG) BY MOUTH TWICE DAILY WITH A MEAL 08/04/21   Plotnikov, Evie Lacks, MD  Multiple Vitamin (MULTIVITAMIN WITH MINERALS) TABS tablet Take 1 tablet by mouth daily.    [provider]  nateglinide (STARLIX) 120 MG tablet TAKE 1 TABLET(120 MG) BY MOUTH THREE TIMES DAILY WITH MEALS 08/10/20   Plotnikov, Evie Lacks, MD  omeprazole (PRILOSEC) 40 MG capsule TAKE 1 CAPSULE BY MOUTH DAILY 02/08/21   Plotnikov, Evie Lacks, MD  predniSONE (STERAPRED UNI-PAK 21 TAB) 10 MG (21) TBPK tablet Take by mouth daily. Take 6 tabs by mouth daily  for 2 days, then 5 tabs for 2 days, then 4 tabs for 2 days, then 3 tabs for 2 days, 2 tabs for 2 days, then 1 tab by mouth daily for 2 days 08/22/21   Hans Eden, NP      Allergies    Patient has no known allergies.    Review of Systems   Review of Systems  Constitutional:  Negative for chills and fever.  HENT:  Negative for ear pain and sore throat.   Eyes:  Negative for pain and visual disturbance.  Respiratory:  Negative for cough and shortness of breath.   Cardiovascular:  Negative for chest pain and palpitations.  Gastrointestinal:  Positive for nausea. Negative for abdominal pain and vomiting.  Genitourinary:  Negative for dysuria and hematuria.  Musculoskeletal:  Negative for arthralgias and back pain.  Skin:  Negative for color change and rash.  Neurological:  Positive for headaches. Negative for seizures and syncope.  All other systems reviewed and are negative.   Physical Exam Updated Vital Signs BP (!) 145/90   Pulse (!) 56   Temp 98.5 F (36.9 C) (Oral)   Resp 16   SpO2 99%  Physical Exam  ED Results / Procedures / Treatments   Labs (all labs ordered are listed, but only abnormal results are displayed) Labs Reviewed  CBC - Abnormal; Notable for the following components:      Result Value   WBC 13.3 (*)    All other components within normal limits   BASIC METABOLIC PANEL - Abnormal; Notable for the following components:   Sodium 133 (*)    Chloride 95 (*)    Glucose, Bld 127 (*)    All other components within normal limits    EKG None  Radiology CT Head Wo Contrast  Result Date: 08/30/2021 CLINICAL DATA:  Headache.  Worsening headache EXAM: CT HEAD WITHOUT CONTRAST TECHNIQUE: Contiguous axial images were obtained from the base of the skull through the vertex without intravenous contrast. RADIATION DOSE REDUCTION: This exam was performed according to the departmental dose-optimization program which includes automated exposure control, adjustment of the mA and/or kV according to patient size and/or use of iterative reconstruction technique. COMPARISON:  None Available. FINDINGS: Brain: No acute intracranial hemorrhage. No focal mass lesion. No CT evidence of acute infarction. No midline shift or mass effect. No hydrocephalus. Basilar cisterns are patent. Minimal periventricular and subcortical white matter hypodensities. Mild generalized cortical atrophy. Vascular: No hyperdense vessel or unexpected calcification. Skull: Normal. Negative for fracture or focal lesion. Sinuses/Orbits: Paranasal sinuses and mastoid air cells are clear. Orbits are clear. Other: None. IMPRESSION: 1. No acute intracranial findings. 2. Minimal atrophy and white matter microvascular disease. Electronically Signed   By: Suzy Bouchard M.D.   On: 08/30/2021 17:42    Procedures Procedures  {Document cardiac monitor, telemetry assessment procedure when appropriate:1}  Medications Ordered in ED Medications - No data to display  ED Course/ Medical Decision Making/ A&P                           Medical Decision Making Risk OTC drugs.   ***  {Document critical care time when appropriate:1} {Document review of labs and clinical decision tools ie heart score, Chads2Vasc2 etc:1}  {Document your independent review of radiology images, and any outside  records:1} {Document your discussion with family members, caretakers, and with consultants:1} {Document social determinants of health affecting pt's care:1} {Document your decision making why or why not admission, treatments were needed:1} Final Clinical Impression(s) / ED Diagnoses Final diagnoses:  None    Rx / DC Orders ED Discharge Orders     None

## 2021-08-30 NOTE — ED Provider Triage Note (Signed)
Emergency Medicine Provider Triage Evaluation Note  Jon [name] , a 76 y.o. male  was evaluated in triage.  Pt complains of severe headache that woke him up from his sleep around 3 AM.  He states the pain is got worse since.  Denies history of migraines.  Does not routinely get headaches.  Denies visual change.  Received medications in urgent care with only minimal improvement.  Review of Systems  Positive: As above Negative: As above  Physical Exam  BP (!) 141/94 (BP Location: Right Arm)   Pulse (!) 53   Temp 98.3 F (36.8 C) (Oral)   Resp 16   SpO2 97%  Gen:   Awake, no distress   Resp:  Normal effort  MSK:   Moves extremities without difficulty  Other:  Neuro exam without focal deficits.  Medical Decision Making  Medically screening exam initiated at 3:53 PM.  Appropriate orders placed.  [name] was informed that the remainder of the evaluation will be completed by another provider, this initial triage assessment does not replace that evaluation, and the importance of remaining in the ED until their evaluation is complete.     Evlyn Courier, PA-C 08/30/21 1555

## 2021-08-30 NOTE — ED Notes (Signed)
Patient is being discharged from the Urgent Care and sent to the Emergency Department via POV . Per Sharyn Lull NP, patient is in need of higher level of care due to headache unrelieved by medication.  Patient is aware and verbalizes understanding of plan of care.  Vitals:   08/30/21 1259 08/30/21 1315  BP: (!) 197/97 (!) 157/99  Pulse: (!) 51   Resp: 20   Temp: 98.7 F (37.1 C)   SpO2: 99%

## 2021-08-30 NOTE — Discharge Instructions (Addendum)
Please go directly to the nearest emergency department for further evaluation and management of your headache.  You would benefit from urgent lab work and work-up in the emergency department since your headache did not improve after intervention in urgent care with ketorolac, Decadron, and Reglan headache cocktail.

## 2021-08-30 NOTE — ED Triage Notes (Signed)
Pt c/o HTN, HA beginning 3a that woke him from sleep, kept getting worse as day has gone on, associated nausea. Denies blurred vision, emesis, dizziness, weakness, stroke-like symptoms. Reports pulsating/throbbing behind eyes, worse in R side. Seen at [facility] for same, given toradol, decadron & reglan w "some" benefit, from 8 to a 6. Sent to ED for further eval

## 2021-08-30 NOTE — ED Triage Notes (Addendum)
Pt presents to uc with c/o vomiting and high blood pressure. Pt reports his symptoms started this morning. Pt reports a severe headache.

## 2021-08-30 NOTE — ED Provider Notes (Signed)
[name]    CSN: [id number] Arrival date & time: 08/30/21  1242      History   Chief Complaint Chief Complaint  Patient presents with   Emesis   Headache    HPI Jon [name] is a 76 y.o. male.   Patient presents to urgent care for evaluation of severe headache that woke him up out of his sleep at 3 AM this morning with associated symptom of nausea.  Patient states that his head pain is mostly behind both of his eyes and worse to the right side than the left.  Pain is currently an 8 on a scale of 0-10 and he describes his pain as "pulsating".  Patient wears glasses and states that his vision is normal and without blurry vision/decreased visual acuity.  No dizziness, fever/chills, vomiting, abdominal pain, ear pain, sore throat, nasal congestion, cough, chest pain, shortness of breath, or one-sided weakness reported.  Patient does not have a history of CVA.  No recent falls or trauma to the head.  He has high blood pressure and used to take blood pressure medication but does not anymore.  He states that his blood pressures are usually "normal" and much lower than reading today.  Blood pressure upon arrival to urgent care was 197/97 and 157/99 on repeat.  Patient has a significant medical history of diabetes and states that his blood sugars have been running in the 100s.  He took all of his normal daily medications today as prescribed prior to arriving urgent care but has not had any medications to treat his headache.  Pain improves when he sits up and leans forward and worsens when he lays back.  Cannot identify any other aggravating or relieving factors at this time for his symptoms.   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 pylori infection 2011   Heart murmur    History of radiation therapy 09/20/10 thru 10/29/10   gastroesophageal junction/gastric cardia   Hypertension    Neuromuscular disorder ([place])    Other and unspecified hyperlipidemia    Sleep apnea    On CPAP   Status post chemotherapy 09/22/10 thru 10/27/10   concurrent w/radiation   Unspecified sleep apnea     Patient Active Problem List   Diagnosis Date Noted   Obesity (BMI 30.0-34.9) 02/24/2020   Callus of heel 06/10/2019   Abnormal urine color 11/13/2018   Leg edema, right 10/30/2018   Coronary artery disease 03/28/2018   Paresthesia 03/28/2018   Polycythemia 12/21/2015   Erectile dysfunction 06/18/2013   Piriformis syndrome of right side 01/08/2013   Diabetes mellitus, type 2 ([place]) 02/16/2012   Shoulder pain, right 10/17/2011   Well adult exam 07/13/2011   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[place])    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[place])    Status post chemotherapy    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   Dysphagia 07/09/2010   Hypertension 07/09/2010   Neoplasm of uncertain behavior of skin 12/31/2009   CERUMEN IMPACTION 12/31/2009   HYPERGLYCEMIA 12/31/2009   VERTIGO 97/67/3419   HELICOBACTER PYLORI INFECTION 06/30/2009   GERD  06/16/2009   CHEST PAIN 06/16/2009   Obstructive sleep apnea 10/31/2008   POLYP, COLON 10/31/2007   Dyslipidemia 10/31/2007   PAF (paroxysmal atrial fibrillation) ([place]) 10/31/2007    Past Surgical History:  Procedure Laterality Date   CATARACT EXTRACTION     bilateral   COLONOSCOPY     ELBOW BURSA SURGERY     LEG SURGERY  1980   right leg    SKIN BIOPSY  08/22/11   shave biopsy =benign   TONSILLECTOMY         Home Medications    Prior to Admission medications   Medication Sig Start Date End Date Taking? Authorizing Provider  aspirin 325 MG EC tablet Take  325 mg by mouth daily. 05/10/12   [provider]  Cholecalciferol 1000 UNITS tablet Take 1,000 Units by mouth daily.    [provider]  EPINEPHrine 0.3 mg/0.3 mL IJ SOAJ injection Inject 0.3 mg into the muscle as needed for anaphylaxis. 08/22/21   White, Leitha Schuller, NP  fenofibrate micronized (LOFIBRA) 134 MG capsule TAKE 1 CAPSULE BY MOUTH DAILY BEFORE AND BREAKFAST 02/08/21   Plotnikov, Evie Lacks, MD  metFORMIN (GLUCOPHAGE) 1000 MG tablet TAKE 1 TABLET(1000 MG) BY MOUTH TWICE DAILY WITH A MEAL 08/04/21   Plotnikov, Evie Lacks, MD  Multiple Vitamin (MULTIVITAMIN WITH MINERALS) TABS tablet Take 1 tablet by mouth daily.    [provider]  nateglinide (STARLIX) 120 MG tablet TAKE 1 TABLET(120 MG) BY MOUTH THREE TIMES DAILY WITH MEALS 08/10/20   Plotnikov, Evie Lacks, MD  omeprazole (PRILOSEC) 40 MG capsule TAKE 1 CAPSULE BY MOUTH DAILY 02/08/21   Plotnikov, Evie Lacks, MD  predniSONE (STERAPRED UNI-PAK 21 TAB) 10 MG (21) TBPK tablet Take by mouth daily. Take 6 tabs by mouth daily  for 2 days, then 5 tabs for 2 days, then 4 tabs for 2 days, then 3 tabs for 2 days, 2 tabs for 2 days, then 1 tab by mouth daily for 2 days 08/22/21   Hans Eden, NP    Family History Family History  Problem Relation Age of Onset   Arthritis Mother    Diabetes Father    Diabetes Brother    Coronary artery disease Neg Hx    Colon cancer Neg Hx    Esophageal cancer Neg Hx    Rectal cancer Neg Hx    Stomach cancer Neg Hx     Social History Social History   Tobacco Use   Smoking status: Former    Packs/day: 1.00    Years: 40.00    Total pack years: 40.00    Types: Cigarettes    Quit date: 01/31/2005    Years since quitting: 16.5   Smokeless tobacco: Never  Substance Use Topics   Alcohol use: Yes    Alcohol/week: 0.0 standard drinks of alcohol    Comment: 2 drinks last 6 months per pt   Drug use: No     Allergies   Patient has no known allergies.   Review of Systems Review of  Systems  Gastrointestinal:  Positive for vomiting.  Neurological:  Positive for headaches.  Per HPI   Physical Exam Triage Vital Signs ED Triage Vitals [08/30/21 1259]  Enc Vitals Group     BP (!) 197/97     Pulse Rate (!) 51     Resp 20     Temp 98.7 F (37.1 C)     Temp Source Oral     SpO2 99 %  Weight      Height      Head Circumference      Peak Flow      Pain Score      Pain Loc      Pain Edu?      Excl. in [place]?    No data found.  Updated Vital Signs BP (!) 157/99   Pulse (!) 51   Temp 98.7 F (37.1 C) (Oral)   Resp 20   SpO2 99%   Visual Acuity Right Eye Distance:   Left Eye Distance:   Bilateral Distance:    Right Eye Near:   Left Eye Near:    Bilateral Near:     Physical Exam Vitals and nursing note reviewed.  Constitutional:      Appearance: Normal appearance. He is not ill-appearing or toxic-appearing.     Comments: Very pleasant patient sitting on exam in position of comfort table in no acute distress.   HENT:     Head: Normocephalic and atraumatic.     Right Ear: Hearing, tympanic membrane, ear canal and external ear normal.     Left Ear: Hearing, tympanic membrane, ear canal and external ear normal.     Nose: Nose normal.     Mouth/Throat:     Lips: Pink.     Mouth: Mucous membranes are moist.     Comments: No erythema to the posterior oropharynx present.  Airway is intact. Eyes:     General: Lids are normal. Vision grossly intact. Gaze aligned appropriately. No visual field deficit or scleral icterus.       Right eye: No discharge.        Left eye: No discharge.     Extraocular Movements: Extraocular movements intact.     Conjunctiva/sclera: Conjunctivae normal.     Right eye: Right conjunctiva is not injected.     Left eye: Left conjunctiva is not injected.     Pupils: Pupils are equal, round, and reactive to light.  Cardiovascular:     Rate and Rhythm: Normal rate and regular rhythm.     Heart sounds: Normal heart sounds, S1  normal and S2 normal.  Pulmonary:     Effort: Pulmonary effort is normal. No respiratory distress.     Breath sounds: Normal breath sounds and air entry.     Comments: Clear to auscultation.  No respiratory distress. Abdominal:     General: Bowel sounds are normal.     Palpations: Abdomen is soft.     Tenderness: There is no abdominal tenderness. There is no right CVA tenderness, left CVA tenderness or guarding.  Musculoskeletal:     Cervical back: Normal range of motion and neck supple. No muscular tenderness.  Lymphadenopathy:     Cervical: No cervical adenopathy.  Skin:    General: Skin is warm and dry.     Capillary Refill: Capillary refill takes less than 2 seconds.     Findings: No rash.     Comments: Skin tenting normal.  Neurological:     General: No focal deficit present.     Mental Status: He is alert and oriented to person, place, and time. Mental status is at baseline.     Cranial Nerves: Cranial nerves 2-12 are intact. No dysarthria or facial asymmetry.     Sensory: Sensation is intact.     Motor: Motor function is intact. No weakness, tremor or pronator drift.     Coordination: Coordination is intact. Romberg sign negative. Finger-Nose-Finger Test and Heel  to [place] Test normal.     Gait: Gait is intact.     Comments: Patient sitting on exam table with head in his hands and appears to be uncomfortable.  Nonfocal neuro exam.  Psychiatric:        Mood and Affect: Mood normal.        Speech: Speech normal.        Behavior: Behavior normal.        Thought Content: Thought content normal.        Judgment: Judgment normal.      UC Treatments / Results  Labs (all labs ordered are listed, but only abnormal results are displayed) Labs Reviewed  CBG MONITORING, ED - Abnormal; Notable for the following components:      Result Value   Glucose-Capillary 112 (*)    All other components within normal limits    EKG   Radiology No results found.  Procedures Procedures  (including critical care time)  Medications Ordered in UC Medications  ondansetron (ZOFRAN-ODT) disintegrating tablet 4 mg (4 mg Oral Given 08/30/21 1258)  ketorolac (TORADOL) 30 MG/ML injection 30 mg (30 mg Intramuscular Given 08/30/21 1354)  metoCLOPramide (REGLAN) injection 5 mg (5 mg Intramuscular Given 08/30/21 1353)  dexamethasone (DECADRON) injection 10 mg (10 mg Intramuscular Given 08/30/21 1350)    Initial Impression / Assessment and Plan / UC Course  I have reviewed the triage vital signs and the nursing notes.  Pertinent labs & imaging results that were available during my care of the patient were reviewed by me and considered in my medical decision making (see chart for details).  1.  Acute intractable headache Unknown etiology headache.  Head pain did not respond to headache cocktail in clinic or urgent care with 30 mg ketorolac, 5 mg Reglan, and 10 mg Decadron injections IM.  Neurologic exam is without focal deficit and patient is neurologically intact at this time.  Recommend patient go to the emergency department for further evaluation and management of his head pain at this time as it has not responded well to conservative symptomatic treatment in the urgent care setting.  Blood pressure at previous visits has been low 100s over 70s and is now acutely elevated.  Unsure if this is responsive or causative to head pain.  Regardless, patient would benefit from emergency department visit at this time.  Discussed risks of deferring emergency department visit to which patient and family member verbalized understanding and agreement.  Patient is stable for transport to the ER by personal vehicle.  Patient discharged from urgent care with stable vital signs to [facility] emergency department.  Follow-up with urgent care as needed.  Final Clinical Impressions(s) / UC Diagnoses   Final diagnoses:  Acute intractable headache, unspecified headache type  Hypertension, unspecified type  Nausea  without vomiting     Discharge Instructions      Please go directly to the nearest emergency department for further evaluation and management of your headache.  You would benefit from urgent lab work and work-up in the emergency department since your headache did not improve after intervention in urgent care with ketorolac, Decadron, and Reglan headache cocktail.      ED Prescriptions   None    PDMP not reviewed this encounter.   Talbot Grumbling, [place] 08/30/21 1436

## 2021-08-30 NOTE — ED Notes (Signed)
Patient verbalizes understanding of d/c instructions. Opportunities for questions and answers were provided. Pt d/c from ED and ambulated to lobby with son.

## 2021-08-30 NOTE — Discharge Instructions (Signed)
You were seen in the emergency room today for headache.  We did a CT scan of your head that did not show anything wrong.  Your blood work looks good.  Within your safe to go home.  If your headache suddenly worsens or you are not to keep things down by mouth please come back to the emergency department.  We would like you to see your regular doctor in the next several days to talk about your high blood pressure and potential prescribing you an antimigraine medicine.

## 2021-08-31 ENCOUNTER — Ambulatory Visit (INDEPENDENT_AMBULATORY_CARE_PROVIDER_SITE_OTHER): Payer: Medicare Other

## 2021-08-31 DIAGNOSIS — Z0000 Encounter for general adult medical examination without abnormal findings: Secondary | ICD-10-CM

## 2021-08-31 NOTE — Patient Instructions (Signed)
[name] , Thank you for taking time to come for your Medicare Wellness Visit. I appreciate your ongoing commitment to your health goals. Please review the following plan we discussed and let me know if I can assist you in the future.   Screening recommendations/referrals: Colonoscopy: 5//20/2020; due every 5 years Recommended yearly ophthalmology/optometry visit for glaucoma screening and checkup Recommended yearly dental visit for hygiene and checkup  Vaccinations: Influenza vaccine: declined Pneumococcal vaccine: 03/21/2017, 12/16/2019 Tdap vaccine: 06/18/2014; due every 10 years Shingles vaccine: 01/30/2017, 04/05/2017   Covid-19: 03/23/2019, 04/16/2019, 12/23/2019, 09/02/2020, 02/08/2021  Advanced directives: No  Conditions/risks identified: Yes  Next appointment: Please schedule your next Medicare Wellness Visit with your Nurse Health Advisor in 1 year by calling [phone].  Preventive Care 76 Years and Older, Male Preventive care refers to lifestyle choices and visits with your health care provider that can promote health and wellness. What does preventive care include? A yearly physical exam. This is also called an annual well check. Dental exams once or twice a year. Routine eye exams. Ask your health care provider how often you should have your eyes checked. Personal lifestyle choices, including: Daily care of your teeth and gums. Regular physical activity. Eating a healthy diet. Avoiding tobacco and drug use. Limiting alcohol use. Practicing safe sex. Taking low doses of aspirin every day. Taking vitamin and mineral supplements as recommended by your health care provider. What happens during an annual well check? The services and screenings done by your health care provider during your annual well check will depend on your age, overall health, lifestyle risk factors, and family history of disease. Counseling  Your health care provider may ask you questions about  your: Alcohol use. Tobacco use. Drug use. Emotional well-being. Home and relationship well-being. Sexual activity. Eating habits. History of falls. Memory and ability to understand (cognition). Work and work Statistician. Screening  You may have the following tests or measurements: Height, weight, and BMI. Blood pressure. Lipid and cholesterol levels. These may be checked every 5 years, or more frequently if you are over 54 years old. Skin check. Lung cancer screening. You may have this screening every year starting at age 46 if you have a 30-pack-year history of smoking and currently smoke or have quit within the past 15 years. Fecal occult blood test (FOBT) of the stool. You may have this test every year starting at age 55. Flexible sigmoidoscopy or colonoscopy. You may have a sigmoidoscopy every 5 years or a colonoscopy every 10 years starting at age 72. Prostate cancer screening. Recommendations will vary depending on your family history and other risks. Hepatitis C blood test. Hepatitis B blood test. Sexually transmitted disease (STD) testing. Diabetes screening. This is done by checking your blood sugar (glucose) after you have not eaten for a while (fasting). You may have this done every 1-3 years. Abdominal aortic aneurysm (AAA) screening. You may need this if you are a current or former smoker. Osteoporosis. You may be screened starting at age 71 if you are at high risk. Talk with your health care provider about your test results, treatment options, and if necessary, the need for more tests. Vaccines  Your health care provider may recommend certain vaccines, such as: Influenza vaccine. This is recommended every year. Tetanus, diphtheria, and acellular pertussis (Tdap, Td) vaccine. You may need a Td booster every 10 years. Zoster vaccine. You may need this after age 73. Pneumococcal 13-valent conjugate (PCV13) vaccine. One dose is recommended after age 34. Pneumococcal  polysaccharide (PPSV23) vaccine. One dose is recommended after age 30. Talk to your health care provider about which screenings and vaccines you need and how often you need them. This information is not intended to replace advice given to you by your health care provider. Make sure you discuss any questions you have with your health care provider. Document Released: 02/13/2015 Document Revised: 10/07/2015 Document Reviewed: 11/18/2014 Elsevier Interactive Patient Education  2017 [employer] Prevention in the Home Falls can cause injuries. They can happen to people of all ages. There are many things you can do to make your home safe and to help prevent falls. What can I do on the outside of my home? Regularly fix the edges of walkways and driveways and fix any cracks. Remove anything that might make you trip as you walk through a door, such as a raised step or threshold. Trim any bushes or trees on the path to your home. Use bright outdoor lighting. Clear any walking paths of anything that might make someone trip, such as rocks or tools. Regularly check to see if handrails are loose or broken. Make sure that both sides of any steps have handrails. Any raised decks and porches should have guardrails on the edges. Have any leaves, snow, or ice cleared regularly. Use sand or salt on walking paths during winter. Clean up any spills in your garage right away. This includes oil or grease spills. What can I do in the bathroom? Use night lights. Install grab bars by the toilet and in the tub and shower. Do not use towel bars as grab bars. Use non-skid mats or decals in the tub or shower. If you need to sit down in the shower, use a plastic, non-slip stool. Keep the floor dry. Clean up any water that spills on the floor as soon as it happens. Remove soap buildup in the tub or shower regularly. Attach bath mats securely with double-sided non-slip rug tape. Do not have throw rugs and other  things on the floor that can make you trip. What can I do in the bedroom? Use night lights. Make sure that you have a light by your bed that is easy to reach. Do not use any sheets or blankets that are too big for your bed. They should not hang down onto the floor. Have a firm chair that has side arms. You can use this for support while you get dressed. Do not have throw rugs and other things on the floor that can make you trip. What can I do in the kitchen? Clean up any spills right away. Avoid walking on wet floors. Keep items that you use a lot in easy-to-reach places. If you need to reach something above you, use a strong step stool that has a grab bar. Keep electrical cords out of the way. Do not use floor polish or wax that makes floors slippery. If you must use wax, use non-skid floor wax. Do not have throw rugs and other things on the floor that can make you trip. What can I do with my stairs? Do not leave any items on the stairs. Make sure that there are handrails on both sides of the stairs and use them. Fix handrails that are broken or loose. Make sure that handrails are as long as the stairways. Check any carpeting to make sure that it is firmly attached to the stairs. Fix any carpet that is loose or worn. Avoid having throw rugs at the top or  bottom of the stairs. If you do have throw rugs, attach them to the floor with carpet tape. Make sure that you have a light switch at the top of the stairs and the bottom of the stairs. If you do not have them, ask someone to add them for you. What else can I do to help prevent falls? Wear shoes that: Do not have high heels. Have rubber bottoms. Are comfortable and fit you well. Are closed at the toe. Do not wear sandals. If you use a stepladder: Make sure that it is fully opened. Do not climb a closed stepladder. Make sure that both sides of the stepladder are locked into place. Ask someone to hold it for you, if possible. Clearly  mark and make sure that you can see: Any grab bars or handrails. First and last steps. Where the edge of each step is. Use tools that help you move around (mobility aids) if they are needed. These include: Canes. Walkers. Scooters. Crutches. Turn on the lights when you go into a dark area. Replace any light bulbs as soon as they burn out. Set up your furniture so you have a clear path. Avoid moving your furniture around. If any of your floors are uneven, fix them. If there are any pets around you, be aware of where they are. Review your medicines with your doctor. Some medicines can make you feel dizzy. This can increase your chance of falling. Ask your doctor what other things that you can do to help prevent falls. This information is not intended to replace advice given to you by your health care provider. Make sure you discuss any questions you have with your health care provider. Document Released: 11/13/2008 Document Revised: 06/25/2015 Document Reviewed: 02/21/2014 Elsevier Interactive Patient Education  2017 [employer].

## 2021-08-31 NOTE — Progress Notes (Addendum)
I connected with Jon [name] today by telephone and verified that I am speaking with the correct person using two identifiers. Location patient: home Location provider: work Persons participating in the virtual visit: patient, provider.   I discussed the limitations, risks, security and privacy concerns of performing an evaluation and management service by telephone and the availability of in person appointments. I also discussed with the patient that there may be a patient responsible charge related to this service. The patient expressed understanding and verbally consented to this telephonic visit.    Interactive audio and video telecommunications were attempted between this provider and patient, however failed, due to patient having technical difficulties OR patient did not have access to video capability.  We continued and completed visit with audio only.  Some vital signs may be absent or patient reported.   Time Spent with patient on telephone encounter: 30 minutes  Subjective:   Jon [name] is a 76 y.o. male who presents for Medicare Annual/Subsequent preventive examination.  Review of Systems     Cardiac Risk Factors include: advanced age (>63mn, >>92women);diabetes mellitus;dyslipidemia;hypertension;male gender     Objective:    Today's Vitals   08/31/21 0848  PainSc: 0-No pain   There is no height or weight on file to calculate BMI.     08/31/2021    8:50 AM 02/08/2021    8:38 AM 02/07/2020   11:26 AM 12/17/2019   11:24 AM 08/02/2018    9:31 AM 06/20/2016   11:26 AM 11/23/2015    8:41 PM  Advanced Directives  Does Patient Have a Medical Advance Directive? No No Yes No Yes Yes No  Type of Advance Directive     Living will Living will   Does patient want to make changes to medical advance directive?   No - Patient declined  No - Patient declined    Would patient like information on creating a medical advance directive? No - Patient declined No - Patient  declined  No - Patient declined       Current Medications (verified) Outpatient Encounter Medications as of 08/31/2021  Medication Sig   aspirin 325 MG EC tablet Take 325 mg by mouth daily.   Cholecalciferol 1000 UNITS tablet Take 1,000 Units by mouth daily.   EPINEPHrine 0.3 mg/0.3 mL IJ SOAJ injection Inject 0.3 mg into the muscle as needed for anaphylaxis.   fenofibrate micronized (LOFIBRA) 134 MG capsule TAKE 1 CAPSULE BY MOUTH DAILY BEFORE AND BREAKFAST   metFORMIN (GLUCOPHAGE) 1000 MG tablet TAKE 1 TABLET(1000 MG) BY MOUTH TWICE DAILY WITH A MEAL   Multiple Vitamin (MULTIVITAMIN WITH MINERALS) TABS tablet Take 1 tablet by mouth daily.   nateglinide (STARLIX) 120 MG tablet TAKE 1 TABLET(120 MG) BY MOUTH THREE TIMES DAILY WITH MEALS   omeprazole (PRILOSEC) 40 MG capsule TAKE 1 CAPSULE BY MOUTH DAILY   predniSONE (STERAPRED UNI-PAK 21 TAB) 10 MG (21) TBPK tablet Take by mouth daily. Take 6 tabs by mouth daily  for 2 days, then 5 tabs for 2 days, then 4 tabs for 2 days, then 3 tabs for 2 days, 2 tabs for 2 days, then 1 tab by mouth daily for 2 days   No facility-administered encounter medications on file as of 08/31/2021.    Allergies (verified) Patient has no known allergies.   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 pylori infection 2011   Heart murmur    History of radiation therapy 09/20/10 thru 10/29/10   gastroesophageal junction/gastric cardia   Hypertension    Neuromuscular disorder ([place])    Other and unspecified hyperlipidemia    Sleep apnea    On CPAP   Status post chemotherapy 09/22/10 thru 10/27/10   concurrent w/radiation   Unspecified sleep apnea    Past Surgical History:   Procedure Laterality Date   CATARACT EXTRACTION     bilateral   COLONOSCOPY     ELBOW BURSA SURGERY     LEG SURGERY  1980   right leg    SKIN BIOPSY  08/22/11   shave biopsy =benign   TONSILLECTOMY     Family History  Problem Relation Age of Onset   Arthritis Mother    Diabetes Father    Diabetes Brother    Coronary artery disease Neg Hx    Colon cancer Neg Hx    Esophageal cancer Neg Hx    Rectal cancer Neg Hx    Stomach cancer Neg Hx    Social History   Socioeconomic History   Marital status: Widowed    Spouse name: Not on file   Number of children: 2   Years of education: Not on file   Highest education level: Not on file  Occupational History   Occupation: retired    Fish farm manager: RETIRED  Tobacco Use   Smoking status: Former    Packs/day: 1.00    Years: 40.00    Total pack years: 40.00    Types: Cigarettes    Quit date: 01/31/2005    Years since quitting: 16.5   Smokeless tobacco: Never  Substance and Sexual Activity   Alcohol use: Yes    Alcohol/week: 0.0 standard drinks of alcohol    Comment: 2 drinks last 6 months per pt   Drug use: No   Sexual activity: Yes  Other Topics Concern   Not on file  Social History Narrative   Not on file   Social Determinants of Health   Financial Resource Strain: Low Risk  (08/31/2021)   Overall Financial Resource Strain (CARDIA)    Difficulty of Paying Living Expenses: Not hard at all  Food Insecurity: No Food Insecurity (08/31/2021)   Hunger Vital Sign    Worried About Running Out of Food in the Last Year: Never true    Ran Out of Food in the Last Year: Never true  Transportation Needs: No Transportation Needs (08/31/2021)   PRAPARE - Hydrologist (Medical): No    Lack of Transportation (Non-Medical): No  Physical Activity: Sufficiently Active (08/31/2021)   Exercise Vital Sign    Days of Exercise per Week: 3 days    Minutes of Exercise per Session: 60 min  Stress: No Stress Concern Present  (08/31/2021)   [place]    Feeling of Stress : Not at all  Social Connections: Moderately Integrated (08/31/2021)   Social Connection and Isolation Panel [NHANES]    Frequency of Communication with Friends and Family: More than three times a week    Frequency of Social Gatherings with Friends and Family: Once a week    Attends Religious Services: 1 to 4 times per year  Active Member of Clubs or Organizations: Yes    Attends Archivist Meetings: 1 to 4 times per year    Marital Status: Widowed    Tobacco Counseling Counseling given: Not Answered   Clinical Intake:  Pre-visit preparation completed: Yes  Pain : No/denies pain Pain Score: 0-No pain     Nutritional Risks: None Diabetes: Yes CBG done?: No Did pt. bring in CBG monitor from home?: No  How often do you need to have someone help you when you read instructions, pamphlets, or other written materials from your doctor or pharmacy?: 1 - Never What is the last grade level you completed in school?: [employer]  Diabetic? yes  Interpreter Needed?: No  Information entered by :: Lisette Abu, LPN.   Activities of Daily Living    08/31/2021    9:01 AM  In your present state of health, do you have any difficulty performing the following activities:  Hearing? 0  Vision? 0  Difficulty concentrating or making decisions? 0  Walking or climbing stairs? 0  Dressing or bathing? 0  Doing errands, shopping? 0  Preparing Food and eating ? N  Using the Toilet? N  In the past six months, have you accidently leaked urine? N  Do you have problems with loss of bowel control? N  Managing your Medications? N  Managing your Finances? N  Housekeeping or managing your Housekeeping? N    Patient Care Team: Plotnikov, Evie Lacks, MD as PCP - General Henrene Pastor Docia Chuck, MD (Gastroenterology) Fay Records, MD (Cardiology) Clance, Armando Reichert, MD (Pulmonary  Disease) Ladell Pier, MD as Consulting Physician (Internal Medicine) Kyung Rudd, MD as Consulting Physician (Radiation Oncology) Stark Klein, MD as Consulting Physician (General Surgery) Irene Shipper, MD as Consulting Physician (Gastroenterology) Nicanor Alcon, MD (Thoracic Surgery) Hortencia Pilar, MD as Consulting Physician (Ophthalmology)  Indicate any recent Medical Services you may have received from other than [facility] providers in the past year (date may be approximate).     Assessment:   This is a routine wellness examination for Kunal.  Hearing/Vision screen Hearing Screening - Comments:: Patient denied any hearing difficulty.   No hearing aids.  Vision Screening - Comments:: Patient does wear corrective lenses/contacts.  Eye exam done by: [facility]    Dietary issues and exercise activities discussed: Current Exercise Habits: Home exercise routine, Type of exercise: walking;treadmill;stretching;strength training/weights, Time (Minutes): 60, Frequency (Times/Week): 3, Weekly Exercise (Minutes/Week): 180, Intensity: Moderate   Goals Addressed             This Visit's Progress    My goal is to get back int [facility] routine at the [facility].        Depression Screen    08/31/2021    8:53 AM 07/03/2020    9:24 AM 12/17/2019   11:26 AM 06/10/2019    9:13 AM 03/28/2018    8:03 AM 12/20/2016    7:52 AM 07/14/2015    7:59 AM  PHQ 2/9 Scores  PHQ - 2 Score 0 0 0 0 0 0 0    Fall Risk    08/31/2021    8:51 AM 07/03/2020    9:24 AM 12/17/2019   11:25 AM 06/10/2019    9:13 AM 03/28/2018    8:03 AM  Fall Risk   Falls in the past year? 0 0 0 0 0  Number falls in past yr: 0 0 0    Injury with Fall? 0 0 0  Risk for fall due to : No Fall Risks  No Fall Risks    Follow up Falls evaluation completed  Falls evaluation completed Falls evaluation completed Falls evaluation completed    [place]:  Any stairs in or around the  home? No  If so, are there any without handrails? No  Home free of loose throw rugs in walkways, pet beds, electrical cords, etc? yes Adequate lighting in your home to reduce risk of falls? Yes   ASSISTIVE DEVICES UTILIZED TO PREVENT FALLS:  Life alert? No  Use of a cane, walker or w/c? No  Grab bars in the bathroom? No  Shower chair or bench in shower? Yes  Elevated toilet seat or a handicapped toilet? Yes   TIMED UP AND GO:  Was the test performed? No .  Length of time to ambulate 10 feet: n/a sec.   Appearance of gait: Gait not evaluated during this visit.  Cognitive Function:        08/31/2021    9:02 AM  6CIT Screen  What Year? 0 points  What month? 0 points  What time? 0 points  Count back from 20 0 points  Months in reverse 0 points  Repeat phrase 0 points  Total Score 0 points    Immunizations Immunization History  Administered Date(s) Administered   PFIZER(Purple Top)SARS-COV-2 Vaccination 03/23/2019, 04/16/2019, 12/23/2019, 09/02/2020   Pfizer Covid-19 Vaccine Bivalent Booster 5yr & up 02/08/2021   Pneumococcal Conjugate-13 12/18/2012, 12/16/2019   Pneumococcal Polysaccharide-23 03/15/2011, 03/21/2017   Td 03/18/2004, 06/18/2014   Zoster Recombinat (Shingrix) 01/30/2017, 04/05/2017   Zoster, Live 12/31/2009    TDAP status: Up to date  Flu Vaccine status: Declined, Education has been provided regarding the importance of this vaccine but patient still declined. Advised may receive this vaccine at local pharmacy or Health Dept. Aware to provide a copy of the vaccination record if obtained from local pharmacy or Health Dept. Verbalized acceptance and understanding.  Pneumococcal vaccine status: Up to date  Covid-19 vaccine status: Completed vaccines  Qualifies for Shingles Vaccine? Yes   Zostavax completed Yes   Shingrix Completed?: Yes  Screening Tests Health Maintenance  Topic Date Due   Diabetic kidney evaluation - Urine ACR  Never done    Hepatitis C Screening  Never done   FOOT EXAM  12/18/2013   HEMOGLOBIN A1C  11/11/2021   OPHTHALMOLOGY EXAM  04/27/2022   Diabetic kidney evaluation - GFR measurement  08/31/2022   COLONOSCOPY (Pts 45-484yrInsurance coverage will need to be confirmed)  06/20/2023   TETANUS/TDAP  06/17/2024   Pneumonia Vaccine [age]Years old  Completed   COVID-19 Vaccine  Completed   Zoster Vaccines- Shingrix  Completed   HPV VACCINES  Aged Out   INFLUENZA VACCINE  Discontinued    Health Maintenance  Health Maintenance Due  Topic Date Due   Diabetic kidney evaluation - Urine ACR  Never done   Hepatitis C Screening  Never done   FOOT EXAM  12/18/2013    Colorectal cancer screening: Type of screening: Colonoscopy. Completed 5//20/2020. Repeat every 5 years  Lung Cancer Screening: (Low Dose CT Chest recommended if Age 76-80ears, 30 pack-year currently smoking OR have quit w/in 15years.) does not qualify.   Lung Cancer Screening Referral: no  Additional Screening:  Hepatitis C Screening: does qualify; Completed no  Vision Screening: Recommended annual ophthalmology exams for early detection of glaucoma and other disorders of the eye. Is the patient up to  date with their annual eye exam?  Yes  Who is the provider or what is the name of the office in which the patient attends annual eye exams? [facility] If pt is not established with a provider, would they like to be referred to a provider to establish care? No .   Dental Screening: Recommended annual dental exams for proper oral hygiene  Community Resource Referral / Chronic Care Management: CRR required this visit?  No   CCM required this visit?  No      Plan:     I have personally reviewed and noted the following in the patient's chart:   Medical and social history Use of alcohol, tobacco or illicit drugs  Current medications and supplements including opioid prescriptions. Patient is not currently taking opioid  prescriptions. Functional ability and status Nutritional status Physical activity Advanced directives List of other physicians Hospitalizations, surgeries, and ER visits in previous 12 months Vitals Screenings to include cognitive, depression, and falls Referrals and appointments  In addition, I have reviewed and discussed with patient certain preventive protocols, quality metrics, and best practice recommendations. A written personalized care plan for preventive services as well as general preventive health recommendations were provided to patient.     Sheral Flow, LPN   10/06/452   Nurse Notes:  Patient is cogitatively intact. There were no vitals filed for this visit. There is no height or weight on file to calculate BMI. Patient stated that he has no issues with gait or balance; does not use any assistive devices.   Medical screening examination/treatment/procedure(s) were performed by non-physician practitioner and as supervising physician I was immediately available for consultation/collaboration.  I agree with above. Lew Dawes, MD

## 2021-09-02 ENCOUNTER — Other Ambulatory Visit: Payer: Self-pay

## 2021-09-02 ENCOUNTER — Encounter (HOSPITAL_COMMUNITY): Payer: Self-pay | Admitting: Emergency Medicine

## 2021-09-02 ENCOUNTER — Emergency Department (HOSPITAL_COMMUNITY)
Admission: EM | Admit: 2021-09-02 | Discharge: 2021-09-02 | Disposition: A | Discharge status: Home / Self Care | Payer: Medicare Other | Attending: Emergency Medicine | Admitting: Emergency Medicine

## 2021-09-02 ENCOUNTER — Ambulatory Visit (INDEPENDENT_AMBULATORY_CARE_PROVIDER_SITE_OTHER): Payer: Medicare Other | Admitting: Nurse Practitioner

## 2021-09-02 VITALS — BP 132/80 | HR 64 | Temp 99.0°F | Ht 70.0 in | Wt 194.0 lb

## 2021-09-02 DIAGNOSIS — R799 Abnormal finding of blood chemistry, unspecified: Secondary | ICD-10-CM | POA: Insufficient documentation

## 2021-09-02 DIAGNOSIS — Z7984 Long term (current) use of oral hypoglycemic drugs: Secondary | ICD-10-CM | POA: Diagnosis not present

## 2021-09-02 DIAGNOSIS — Z79899 Other long term (current) drug therapy: Secondary | ICD-10-CM | POA: Diagnosis not present

## 2021-09-02 DIAGNOSIS — R519 Headache, unspecified: Secondary | ICD-10-CM

## 2021-09-02 DIAGNOSIS — R61 Generalized hyperhidrosis: Secondary | ICD-10-CM | POA: Diagnosis not present

## 2021-09-02 DIAGNOSIS — E119 Type 2 diabetes mellitus without complications: Secondary | ICD-10-CM | POA: Diagnosis not present

## 2021-09-02 DIAGNOSIS — R21 Rash and other nonspecific skin eruption: Secondary | ICD-10-CM

## 2021-09-02 DIAGNOSIS — I10 Essential (primary) hypertension: Secondary | ICD-10-CM | POA: Diagnosis not present

## 2021-09-02 DIAGNOSIS — Z7951 Long term (current) use of inhaled steroids: Secondary | ICD-10-CM | POA: Diagnosis not present

## 2021-09-02 DIAGNOSIS — R899 Unspecified abnormal finding in specimens from other organs, systems and tissues: Secondary | ICD-10-CM

## 2021-09-02 HISTORY — DX: Headache, unspecified: R51.9

## 2021-09-02 LAB — COMPREHENSIVE METABOLIC PANEL
ALT: 36 U/L (ref 0–53)
AST: 25 U/L (ref 0–37)
Albumin: 4.4 g/dL (ref 3.5–5.2)
Alkaline Phosphatase: 50 U/L (ref 39–117)
BUN: 18 mg/dL (ref 6–23)
CO2: 27 mEq/L (ref 19–32)
Calcium: 9.9 mg/dL (ref 8.4–10.5)
Chloride: 91 mEq/L — ABNORMAL LOW (ref 96–112)
Creatinine, Ser: 1.16 mg/dL (ref 0.40–1.50)
GFR: 61.3 mL/min (ref >60.00)
Glucose, Bld: 114 mg/dL — ABNORMAL HIGH (ref 70–99)
Potassium: 4.5 mEq/L (ref 3.5–5.1)
Sodium: 126 mEq/L — ABNORMAL LOW (ref 135–145)
Total Bilirubin: 1.3 mg/dL — ABNORMAL HIGH (ref 0.2–1.2)
Total Protein: 7.6 g/dL (ref 6.0–8.3)

## 2021-09-02 LAB — BASIC METABOLIC PANEL
Anion gap: 12 (ref 5–15)
BUN: 22 mg/dL (ref 8–23)
CO2: 23 mmol/L (ref 22–32)
Calcium: 9.7 mg/dL (ref 8.9–10.3)
Chloride: 93 mmol/L — ABNORMAL LOW (ref 98–111)
Creatinine, Ser: 1.37 mg/dL — ABNORMAL HIGH (ref 0.61–1.24)
GFR, Estimated: 53 mL/min — ABNORMAL LOW (ref >60)
Glucose, Bld: 83 mg/dL (ref 70–99)
Potassium: 4.3 mmol/L (ref 3.5–5.1)
Sodium: 128 mmol/L — ABNORMAL LOW (ref 135–145)

## 2021-09-02 LAB — CBC
HCT: 50.1 % (ref 39.0–52.0)
Hemoglobin: 17.6 g/dL — ABNORMAL HIGH (ref 13.0–17.0)
MCH: 29.9 pg (ref 26.0–34.0)
MCHC: 35.1 g/dL (ref 30.0–36.0)
MCV: 85.1 fL (ref 80.0–100.0)
Platelets: 337 10*3/uL (ref 150–400)
RBC: 5.89 MIL/uL — ABNORMAL HIGH (ref 4.22–5.81)
RDW: 13.8 % (ref 11.5–15.5)
WBC: 17 10*3/uL — ABNORMAL HIGH (ref 4.0–10.5)
nRBC: 0 % (ref 0.0–0.2)

## 2021-09-02 LAB — C-REACTIVE PROTEIN: CRP: 4.6 mg/dL (ref 0.5–20.0)

## 2021-09-02 LAB — HEMOGLOBIN A1C: Hgb A1c MFr Bld: 7.1 % — ABNORMAL HIGH (ref 4.6–6.5)

## 2021-09-02 LAB — SEDIMENTATION RATE: Sed Rate: 29 mm/hr — ABNORMAL HIGH (ref 0–20)

## 2021-09-02 MED ORDER — DIPHENHYDRAMINE HCL 25 MG PO CAPS
25.0000 mg | ORAL_CAPSULE | Freq: Once | ORAL | Status: AC
  Administered 2021-09-02: 25 mg via ORAL

## 2021-09-02 MED ORDER — KETOROLAC TROMETHAMINE 30 MG/ML IJ SOLN
30.0000 mg | Freq: Once | INTRAMUSCULAR | Status: AC
  Administered 2021-09-02: 30 mg via INTRAMUSCULAR

## 2021-09-02 MED ORDER — PREDNISONE 10 MG (21) PO TBPK: ORAL_TABLET | ORAL | 0 refills | Status: AC

## 2021-09-02 MED ORDER — DIPHENHYDRAMINE HCL 50 MG/ML IJ SOLN: 25.0000 mg | Freq: Once | INTRAMUSCULAR | Status: DC

## 2021-09-02 NOTE — Patient Instructions (Signed)
IF YOU HAVE ANY LOSS OF VISION, NEW BLURRY VISION, OR DOUBLE VISION CALL 911 IF YOU SEE ANY BLOOD IN STOOL, DARK/TARRY STOOL, BLOOD IN VOMIT, OR COFFEE-GROUND VOMIT CALL 911  DO NOT TAKE ANY NSAIDS WITH YOUR PREDNISONE (AVOID ALEVE, NAPROXEN, GOODY POWDER, ASPIRIN, MOTRIN, IBUPROFEN). TAKE ONLY TYLENOL IF NEEDING SOMETHING EXTRA FOR PAIN  WE WILL NOTIFY YOU OF YOUR LAB RESULTS ONCE THEY RETURN AND RECOMMEND NEXT STEPS BASED ON THOSE.

## 2021-09-02 NOTE — ED Provider Triage Note (Signed)
Emergency Medicine Provider Triage Evaluation Note  Jon [name] , a 76 y.o. male  was evaluated in triage.  Pt sent in for low sodium on routine lab work from PCP.  Sodium of 126.  Patient without symptoms he denies any headaches, seizures, weakness or confusion.  Review of Systems  Positive: None Negative: Headache, seizure, weakness, confusion  Physical Exam  BP 119/84   Pulse 60   Temp 98.7 F (37.1 C) (Oral)   Resp 20   SpO2 95%  Gen:   Awake, no distress   Resp:  Normal effort  MSK:   Moves extremities without difficulty  Other:    Medical Decision Making  Medically screening exam initiated at 3:16 PM.  Appropriate orders placed.  Jon [name] was informed that the remainder of the evaluation will be completed by another provider, this initial triage assessment does not replace that evaluation, and the importance of remaining in the ED until their evaluation is complete.  Will recheck sodium levels   Jon [name], [place] 09/02/21 1519

## 2021-09-02 NOTE — Assessment & Plan Note (Addendum)
Acute, ongoing for 3 to 4 days.   Etiology unclear may be migraine versus GCA.  Consulted with supervising physician (Dr. Quay Burow).  Vital signs stable, no obvious abnormalities on neurologic evaluation, ER eval ruled out brain bleed.  Will retreat today with clinic administered Toradol injection 30 mg, and 25 mg of p.o. Benadryl.  Will restart patient on prednisone taper due to concerns of GCA while awaiting blood work results including ESR and CRP level (we will also get metabolic panel and W8E).  Although unlikely, Lyme disease also is on differential diagnoses list especially considering recent rash (patient reported rash was not bull's-eye and more generalized to arms abdomen and legs).  Will order Lyme disease antibodies, for possible evaluation.  Plan is to determine next steps based on lab results.  May consider MRI of head versus referral to neurology/rheumatology/vascular surgeon.  Patient counseled to proceed to the emergency department if he experiences visual changes or signs of GI bleed.  Patient also warned to avoid use of NSAIDs while taking prednisone.

## 2021-09-02 NOTE — Discharge Instructions (Signed)
You were seen today for abnormal lab value.  Your blood work demonstrated improvement in your sodium levels.  Please follow-up with your primary care doctor in the next 2 to 3 days regarding today's visit and to re-check your blood work. Please return to the emergency department if you experience any chest pain, difficulty breathing, abdominal pain, altered mental status, severe fever, or any other concerning symptom.  Thank you for allowing [place] to participate in your care.

## 2021-09-02 NOTE — Progress Notes (Signed)
Established Patient Office Visit  Subjective   Patient ID: Jon [name], male    DOB: 30-May-1945  Age: 76 y.o. MRN: [id number]  Chief Complaint  Patient presents with   Headache    4 days, went to [facility]    Patient arrives for acute visit for the above. He is accompanied by his family member. He reports a new, severe headache that woke him out of sleep 4 days ago around 3AM.  Pain is rated as 8/10 and is a aching that seems to move across his forehead.  It is intractable.  No weakness or sensory changes noted.  Patient was evaluated in urgent care and ER on 08/30/2021.  Here ports having no symptomatic relief despite evaluation.  He also reports recent history of hives from unknown origin which was treated with prednisone.  He reports he stopped the prednisone prior to headache starting.  He is a diabetic last A1c 6.8.  Headache  This is a new problem. The current episode started in the past 7 days. The problem occurs constantly. The problem has been unchanged. The pain is located in the Bilateral and temporal region. The pain quality is not similar to prior headaches. The pain is severe. Associated symptoms include nausea. Pertinent negatives include no blurred vision, dizziness, eye pain, fever, photophobia, vomiting or weakness. The symptoms are aggravated by bright light. Treatments tried: tylenol, dexamethasone, metclopramide, and ketorolac in UC/ER - without much relief. His past medical history is significant for cancer and hypertension. There is no history of recent head traumas.  CT scan in ER showed no evidence of brain bleed or mass on 08/30/21.  Estimated Creatinine Clearance: 51.3 mL/min (A) (by C-G formula based on SCr of 1.37 mg/dL (H)).     Review of Systems  Constitutional:  Positive for malaise/fatigue. Negative for fever.  Eyes:  Negative for blurred vision, double vision, photophobia and pain.  Respiratory:  Negative for shortness of breath.   Cardiovascular:  Negative  for chest pain.  Gastrointestinal:  Positive for nausea. Negative for vomiting.  Musculoskeletal:  Negative for myalgias (no hip or shoulder pain).  Neurological:  Positive for headaches. Negative for dizziness, sensory change and weakness.      Objective:     BP 132/80 (BP Location: Left Arm, Patient Position: Sitting, Cuff Size: Large)   Pulse 64   Temp 99 F (37.2 C) (Oral)   Ht 5' 10" (1.778 m)   Wt 194 lb (88 kg)   SpO2 96%   BMI 27.84 kg/m    Physical Exam Vitals reviewed.  Constitutional:      Appearance: Normal appearance.  HENT:     Head: Normocephalic and atraumatic.     Comments: Tenderness to temples/frontal scalp with palpation (mild) Cardiovascular:     Rate and Rhythm: Normal rate and regular rhythm.  Pulmonary:     Effort: Pulmonary effort is normal.     Breath sounds: Normal breath sounds.  Musculoskeletal:     Cervical back: Neck supple.  Skin:    General: Skin is warm and dry.  Neurological:     General: No focal deficit present.     Mental Status: He is alert and oriented to person, place, and time.     Sensory: Sensation is intact.     Motor: Motor function is intact.  Psychiatric:        Mood and Affect: Mood normal.        Behavior: Behavior normal.  Thought Content: Thought content normal.        Judgment: Judgment normal.      Results for orders placed or performed during the hospital encounter of 09/02/21  Basic metabolic panel  Result Value Ref Range   Sodium 128 (L) 135 - 145 mmol/L   Potassium 4.3 3.5 - 5.1 mmol/L   Chloride 93 (L) 98 - 111 mmol/L   CO2 23 22 - 32 mmol/L   Glucose, Bld 83 70 - 99 mg/dL   BUN 22 8 - 23 mg/dL   Creatinine, Ser 1.37 (H) 0.61 - 1.24 mg/dL   Calcium 9.7 8.9 - 10.3 mg/dL   GFR, Estimated 53 (L) >60 mL/min   Anion gap 12 5 - 15  CBC  Result Value Ref Range   WBC 17.0 (H) 4.0 - 10.5 K/uL   RBC 5.89 (H) 4.22 - 5.81 MIL/uL   Hemoglobin 17.6 (H) 13.0 - 17.0 g/dL   HCT 50.1 39.0 - 52.0 %    MCV 85.1 80.0 - 100.0 fL   MCH 29.9 26.0 - 34.0 pg   MCHC 35.1 30.0 - 36.0 g/dL   RDW 13.8 11.5 - 15.5 %   Platelets 337 150 - 400 K/uL   nRBC 0.0 0.0 - 0.2 %  Results for orders placed or performed in visit on 09/02/21  Comprehensive metabolic panel  Result Value Ref Range   Sodium 126 (L) 135 - 145 mEq/L   Potassium 4.5 3.5 - 5.1 mEq/L   Chloride 91 (L) 96 - 112 mEq/L   CO2 27 19 - 32 mEq/L   Glucose, Bld 114 (H) 70 - 99 mg/dL   BUN 18 6 - 23 mg/dL   Creatinine, Ser 1.16 0.40 - 1.50 mg/dL   Total Bilirubin 1.3 (H) 0.2 - 1.2 mg/dL   Alkaline Phosphatase 50 39 - 117 U/L   AST 25 0 - 37 U/L   ALT 36 0 - 53 U/L   Total Protein 7.6 6.0 - 8.3 g/dL   Albumin 4.4 3.5 - 5.2 g/dL   GFR 61.30 >60.00 mL/min   Calcium 9.9 8.4 - 10.5 mg/dL  Hemoglobin A1c  Result Value Ref Range   Hgb A1c MFr Bld 7.1 (H) 4.6 - 6.5 %  C-reactive protein  Result Value Ref Range   CRP 4.6 0.5 - 20.0 mg/dL  Sedimentation rate  Result Value Ref Range   Sed Rate 29 (H) 0 - 20 mm/hr      The ASCVD Risk score (Arnett DK, et al., 2019) failed to calculate for the following reasons:   The valid total cholesterol range is 130 to 320 mg/dL    Assessment & Plan:   Problem List Items Addressed This Visit       Musculoskeletal and Integument   Rash   Relevant Orders   B. burgdorfi antibodies     Other   Acute intractable headache - Primary    Acute, ongoing for 3 to 4 days.   Etiology unclear may be migraine versus GCA.  Consulted with supervising physician (Dr. Burns).  Vital signs stable, no obvious abnormalities on neurologic evaluation, ER eval ruled out brain bleed.  Will retreat today with clinic administered Toradol injection 30 mg, and 25 mg of p.o. Benadryl.  Will restart patient on prednisone taper due to concerns of GCA while awaiting blood work results including ESR and CRP level (we will also get metabolic panel and A1c).  Although unlikely, Lyme disease also is on differential diagnoses list  especially   considering recent rash (patient reported rash was not bull's-eye and more generalized to arms abdomen and legs).  Will order Lyme disease antibodies, for possible evaluation.  Plan is to determine next steps based on lab results.  May consider MRI of head versus referral to neurology/rheumatology/vascular surgeon.  Patient counseled to proceed to the emergency department if he experiences visual changes or signs of GI bleed.  Patient also warned to avoid use of NSAIDs while taking prednisone.      Relevant Medications   HYDROcodone-acetaminophen (NORCO/VICODIN) 5-325 MG tablet   HYDROcodone-acetaminophen (NORCO/VICODIN) 5-325 MG tablet   predniSONE (STERAPRED UNI-PAK 21 TAB) 10 MG (21) TBPK tablet   Other Relevant Orders   Sedimentation rate (Completed)   C-reactive protein (Completed)   Hemoglobin A1c (Completed)   Comprehensive metabolic panel (Completed)   B. burgdorfi antibodies    Return in about 1 week (around 09/09/2021) for close monitoring with myself or Dr. Plotnikov.   Total time spent on visit today is 67 minutes including face-to-face evaluation, review of previous medical records, consultation with supervising physician, and formulation/discussion of plan.   E , NP  

## 2021-09-02 NOTE — ED Provider Notes (Signed)
[place] EMERGENCY DEPARTMENT Provider Note   CSN: [id number] Arrival date & time: 09/02/21  1324     History  Chief Complaint  Patient presents with   abnormal labs     HPI 76 year old male past medical history notable for T2DM, OSA, hypertension who is presenting with abnormal labs (Na 126).  Patient reports that earlier this week, he had a headache, which has resolved.  Because of the headache, he has not been sleeping well.  Additionally, patient has not been eating normally, and has been working outside.  He says that he normally eats "food with lots of salt" when he sweats, but that he did not do this due to a "lack of appetite".  Currently, patient reports that he feels "tired".  He denies any other symptoms including vision changes, chest pain, difficulty breathing, fevers, cough, congestion, sore throat, nausea, vomiting, abdominal pain, dysuria, new rashes.  He reports that he had a rash earlier this week, that was due to his laundry detergent.  He took some Benadryl and received a "shot" of something here; the rash has resolved and not recurred.    Home Medications Prior to Admission medications   Medication Sig Start Date End Date Taking? Authorizing Provider  aspirin 325 MG EC tablet Take 325 mg by mouth daily. 05/10/12   [provider]  Cholecalciferol 1000 UNITS tablet Take 1,000 Units by mouth daily.    [provider]  EPINEPHrine 0.3 mg/0.3 mL IJ SOAJ injection Inject 0.3 mg into the muscle as needed for anaphylaxis. 08/22/21   White, Leitha Schuller, NP  fenofibrate micronized (LOFIBRA) 134 MG capsule TAKE 1 CAPSULE BY MOUTH DAILY BEFORE AND BREAKFAST 02/08/21   Plotnikov, Evie Lacks, MD  HYDROcodone-acetaminophen (NORCO/VICODIN) 5-325 MG tablet Take 1 tablet every 6 hours by oral route. 08/26/21   [provider]  HYDROcodone-acetaminophen (NORCO/VICODIN) 5-325 MG tablet Take by mouth. 08/27/21   [provider]  loratadine  (CLARITIN) 10 MG tablet     [provider]  metFORMIN (GLUCOPHAGE) 1000 MG tablet TAKE 1 TABLET(1000 MG) BY MOUTH TWICE DAILY WITH A MEAL 08/04/21   Plotnikov, Evie Lacks, MD  Multiple Vitamin (MULTIVITAMIN WITH MINERALS) TABS tablet Take 1 tablet by mouth daily.    [provider]  nateglinide (STARLIX) 120 MG tablet TAKE 1 TABLET(120 MG) BY MOUTH THREE TIMES DAILY WITH MEALS 08/10/20   Plotnikov, Evie Lacks, MD  omeprazole (PRILOSEC) 40 MG capsule TAKE 1 CAPSULE BY MOUTH DAILY 02/08/21   Plotnikov, Evie Lacks, MD  predniSONE (STERAPRED UNI-PAK 21 TAB) 10 MG (21) TBPK tablet Take 6 tablets (60 mg total) by mouth daily for 1 day, THEN 5 tablets (50 mg total) daily for 1 day, THEN 4 tablets (40 mg total) daily for 1 day, THEN 3 tablets (30 mg total) daily for 1 day, THEN 2 tablets (20 mg total) daily for 1 day, THEN 1 tablet (10 mg total) daily for 1 day. 09/02/21 09/08/21  Ailene Ards, NP      Allergies    Patient has no known allergies.    Review of Systems   Review of Systems As in HPI.  Physical Exam Updated Vital Signs BP 115/80   Pulse (!) 54   Temp 98.8 F (37.1 C) (Oral)   Resp 18   SpO2 99%  Physical Exam Vitals and nursing note reviewed.  Constitutional:      General: He is not in acute distress.    Appearance: Normal appearance. He  is well-developed. He is not ill-appearing or diaphoretic.  HENT:     Head: Normocephalic and atraumatic.     Right Ear: External ear normal.     Left Ear: External ear normal.     Nose: Nose normal.     Mouth/Throat:     Mouth: Mucous membranes are moist.  Eyes:     Conjunctiva/sclera: Conjunctivae normal.     Pupils: Pupils are equal, round, and reactive to light.  Neck:     Comments: Full range of motion of cervical spine.  No nuchal rigidity. Cardiovascular:     Rate and Rhythm: Normal rate and regular rhythm.     Heart sounds: No murmur heard. Pulmonary:     Effort: Pulmonary effort is normal. No respiratory distress.      Breath sounds: Normal breath sounds.  Abdominal:     Palpations: Abdomen is soft.     Tenderness: There is no abdominal tenderness.  Musculoskeletal:     Cervical back: Normal range of motion and neck supple. No tenderness.     Right lower leg: No edema.     Left lower leg: No edema.  Skin:    General: Skin is warm and dry.     Capillary Refill: Capillary refill takes less than 2 seconds.  Neurological:     Mental Status: He is alert.     Gait: Gait normal.     ED Results / Procedures / Treatments   Labs (all labs ordered are listed, but only abnormal results are displayed) Labs Reviewed  BASIC METABOLIC PANEL - Abnormal; Notable for the following components:      Result Value   Sodium 128 (*)    Chloride 93 (*)    Creatinine, Ser 1.37 (*)    GFR, Estimated 53 (*)    All other components within normal limits  CBC - Abnormal; Notable for the following components:   WBC 17.0 (*)    RBC 5.89 (*)    Hemoglobin 17.6 (*)    All other components within normal limits    EKG None  Radiology No results found.  Procedures Procedures    Medications Ordered in ED Medications - No data to display  ED Course/ Medical Decision Making/ A&P                           Medical Decision Making  Jon [name] is a 76 y.o. male with past medical history notable for headache and rash, on steroids, who presented to the ED with abnormal lab value (na 126).  Vitals at presentation within normal limits.  Patient is hemodynamically stable, afebrile, satting well on room air.  Physical exam reassuring.  Normal heart sounds.  Lungs clear to auscultation bilaterally.  Abdomen is soft, nontender to palpation.  No pitting edema.  Normal gait.  Lab work obtained, resulted notable for improving sodium, 128.  Creatinine mildly elevated, at 1.37 from prior.  Patient was offered IV fluids, as he may be dehydrated in the setting of decreased p.o. intake and elevated creatinine, however  declined.  Lab work also notable for CBC with leukocytosis, 17 and hemoglobin 17.6, again potentially consistent in the setting of dehydration.  Additionally, patient is on steroids, which is likely responsible for his leukocytosis today, particularly as patient denies infectious type symptoms including URI symptoms, GI symptoms such as diarrhea, nausea, vomiting, dysuria, and rash.    Patient is declining additional interventions.  Patient overall appears  well, and I believe that the patient is safe and stable for discharge at this time with return precautions provided and a plan for follow-up care in place as needed in the next 2 to 3 days for repeat BMP.  Strict return precautions provided.  The plan for this patient was discussed with Dr. Regenia Skeeter, who voiced agreement and who oversaw evaluation and treatment of this patient.   Final Clinical Impression(s) / ED Diagnoses Final diagnoses:  Abnormal laboratory test    Rx / DC Orders ED Discharge Orders     None         Faylene Million, MD 09/02/21 2020    Sherwood Gambler, MD 09/02/21 2351

## 2021-09-02 NOTE — ED Triage Notes (Addendum)
Patient seen at PCP had labs drawn and was told he had a low sodium.  Pt with no complaints other wise. AOX4. In results NA+ is 126.

## 2021-09-03 LAB — B. BURGDORFI ANTIBODIES: B burgdorferi Ab IgG+IgM: <0.9 index

## 2021-09-10 ENCOUNTER — Encounter: Payer: Self-pay | Admitting: Nurse Practitioner

## 2021-09-10 ENCOUNTER — Ambulatory Visit: Payer: Medicare Other | Admitting: Nurse Practitioner

## 2021-09-10 VITALS — BP 128/76 | HR 67 | Temp 98.8°F | Ht 70.0 in | Wt 198.0 lb

## 2021-09-10 DIAGNOSIS — R519 Headache, unspecified: Secondary | ICD-10-CM

## 2021-09-10 DIAGNOSIS — E871 Hypo-osmolality and hyponatremia: Secondary | ICD-10-CM

## 2021-09-10 LAB — CBC
HCT: 47 % (ref 39.0–52.0)
Hemoglobin: 15.7 g/dL (ref 13.0–17.0)
MCHC: 33.3 g/dL (ref 30.0–36.0)
MCV: 88.4 fl (ref 78.0–100.0)
Platelets: 376 10*3/uL (ref 150.0–400.0)
RBC: 5.32 Mil/uL (ref 4.22–5.81)
RDW: 14.6 % (ref 11.5–15.5)
WBC: 11 10*3/uL — ABNORMAL HIGH (ref 4.0–10.5)

## 2021-09-10 LAB — BASIC METABOLIC PANEL
BUN: 23 mg/dL (ref 6–23)
CO2: 29 mEq/L (ref 19–32)
Calcium: 9.8 mg/dL (ref 8.4–10.5)
Chloride: 99 mEq/L (ref 96–112)
Creatinine, Ser: 1.35 mg/dL (ref 0.40–1.50)
GFR: 51.09 mL/min — ABNORMAL LOW (ref >60.00)
Glucose, Bld: 63 mg/dL — ABNORMAL LOW (ref 70–99)
Potassium: 4.3 mEq/L (ref 3.5–5.1)
Sodium: 135 mEq/L (ref 135–145)

## 2021-09-10 NOTE — Assessment & Plan Note (Signed)
Will check BMP and CBC to monitor patient's most recent hyponatremia and leukocytosis seen on blood work.  Further recommendations may be made based upon these results.

## 2021-09-10 NOTE — Assessment & Plan Note (Signed)
Now resolved.  No additional workup recommended at this time.  Will check metabolic panel and CBC to monitor sodium levels as well as elevated white blood cell count seen in the emergency department.  Patient encouraged to focus on hydration and p.o. intake especially during days of excessive heat.  He reports understanding.  Also encouraged to consider neurologic referral if headache returns.

## 2021-09-10 NOTE — Progress Notes (Signed)
Established Patient Office Visit  Subjective   Patient ID: Jon [name], male    DOB: Jun 22, 1945  Age: 76 y.o. MRN: [id number]  Chief Complaint  Patient presents with   1wk follow up    No concerns or questions    Seen 1 week ago for intractable headache of 4 days.  Given Toradol shot in the office and Benadryl by mouth.  Labs that day showed hyponatremia at 126.  He was then called and told to proceed to the ER in case he had any cerebral edema.  He was not admitted to the hospital and discharged to home with recommendations to focus on oral intake.  Today he reports that he is feeling much better denies any recurrence of headache.  He feels that maybe he had been sweating significantly prior to headache onset and had not been replenishing his salt or water intake appropriately.  Labs were also drawn to screen for GCA including sed rate and CRP when I saw him last week.  Sed rate marginally elevated at 29 CRP normal.  He did take prednisone taper pack which he has now completed.    Review of Systems  Eyes:  Negative for blurred vision.  Respiratory:  Negative for shortness of breath.   Cardiovascular:  Negative for chest pain.  Gastrointestinal:  Negative for nausea.  Neurological:  Negative for headaches.      Objective:     BP 128/76 (BP Location: Right Arm, Patient Position: Sitting, Cuff Size: Large)   Pulse 67   Temp 98.8 F (37.1 C) (Oral)   Ht '5\' 10"'$  (1.778 m)   Wt 198 lb (89.8 kg)   SpO2 96%   BMI 28.41 kg/m    Physical Exam Vitals reviewed.  Constitutional:      Appearance: Normal appearance.  HENT:     Head: Normocephalic and atraumatic.  Cardiovascular:     Rate and Rhythm: Normal rate and regular rhythm.  Pulmonary:     Effort: Pulmonary effort is normal.     Breath sounds: Normal breath sounds.  Musculoskeletal:     Cervical back: Neck supple.  Skin:    General: Skin is warm and dry.  Neurological:     Mental Status: He is alert and oriented  to person, place, and time.  Psychiatric:        Mood and Affect: Mood normal.        Behavior: Behavior normal.        Thought Content: Thought content normal.        Judgment: Judgment normal.      No results found for any visits on 09/10/21.    The ASCVD Risk score (Arnett DK, et al., 2019) failed to calculate for the following reasons:   The valid total cholesterol range is 130 to 320 mg/dL    Assessment & Plan:   Problem List Items Addressed This Visit       Other   Low sodium levels - Primary    Will check BMP and CBC to monitor patient's most recent hyponatremia and leukocytosis seen on blood work.  Further recommendations may be made based upon these results.      Relevant Orders   Basic metabolic panel   CBC   RESOLVED: Acute intractable headache    Now resolved.  No additional workup recommended at this time.  Will check metabolic panel and CBC to monitor sodium levels as well as elevated white blood cell count seen in the emergency  department.  Patient encouraged to focus on hydration and p.o. intake especially during days of excessive heat.  He reports understanding.  Also encouraged to consider neurologic referral if headache returns.       Return if symptoms worsen or fail to improve.    Ailene Ards, NP

## 2021-09-20 DIAGNOSIS — G4733 Obstructive sleep apnea (adult) (pediatric): Secondary | ICD-10-CM | POA: Diagnosis not present

## 2021-09-27 DIAGNOSIS — M7030 Other bursitis of elbow, unspecified elbow: Secondary | ICD-10-CM | POA: Insufficient documentation

## 2021-09-27 DIAGNOSIS — M7022 Olecranon bursitis, left elbow: Secondary | ICD-10-CM | POA: Insufficient documentation

## 2021-09-27 DIAGNOSIS — M25522 Pain in left elbow: Secondary | ICD-10-CM | POA: Insufficient documentation

## 2021-10-11 DIAGNOSIS — M7022 Olecranon bursitis, left elbow: Secondary | ICD-10-CM | POA: Diagnosis not present

## 2021-10-13 ENCOUNTER — Emergency Department (HOSPITAL_COMMUNITY): Payer: Medicare Other

## 2021-10-13 ENCOUNTER — Inpatient Hospital Stay (HOSPITAL_COMMUNITY)
Admission: EM | Disposition: A | Discharge status: Home / Self Care | Payer: Self-pay | Source: Home / Self Care | Attending: Thoracic Surgery (Cardiothoracic Vascular Surgery)

## 2021-10-13 ENCOUNTER — Inpatient Hospital Stay (HOSPITAL_COMMUNITY)
Admission: EM | Admit: 2021-10-13 | Discharge: 2021-11-02 | DRG: 216 | Disposition: A | Discharge status: Home Health | Payer: Medicare Other | Attending: Thoracic Surgery (Cardiothoracic Vascular Surgery) | Admitting: Thoracic Surgery (Cardiothoracic Vascular Surgery)

## 2021-10-13 ENCOUNTER — Inpatient Hospital Stay (HOSPITAL_COMMUNITY): Payer: Medicare Other

## 2021-10-13 ENCOUNTER — Other Ambulatory Visit: Payer: Self-pay

## 2021-10-13 ENCOUNTER — Encounter (HOSPITAL_COMMUNITY): Payer: Self-pay | Admitting: Emergency Medicine

## 2021-10-13 DIAGNOSIS — R079 Chest pain, unspecified: Secondary | ICD-10-CM | POA: Diagnosis present

## 2021-10-13 DIAGNOSIS — Z8679 Personal history of other diseases of the circulatory system: Secondary | ICD-10-CM

## 2021-10-13 DIAGNOSIS — C169 Malignant neoplasm of stomach, unspecified: Secondary | ICD-10-CM | POA: Diagnosis present

## 2021-10-13 DIAGNOSIS — N179 Acute kidney failure, unspecified: Secondary | ICD-10-CM | POA: Diagnosis not present

## 2021-10-13 DIAGNOSIS — I4892 Unspecified atrial flutter: Secondary | ICD-10-CM | POA: Diagnosis not present

## 2021-10-13 DIAGNOSIS — R570 Cardiogenic shock: Secondary | ICD-10-CM | POA: Diagnosis not present

## 2021-10-13 DIAGNOSIS — Z9889 Other specified postprocedural states: Secondary | ICD-10-CM

## 2021-10-13 DIAGNOSIS — Z923 Personal history of irradiation: Secondary | ICD-10-CM | POA: Diagnosis not present

## 2021-10-13 DIAGNOSIS — I959 Hypotension, unspecified: Secondary | ICD-10-CM | POA: Diagnosis not present

## 2021-10-13 DIAGNOSIS — I4891 Unspecified atrial fibrillation: Secondary | ICD-10-CM

## 2021-10-13 DIAGNOSIS — I213 ST elevation (STEMI) myocardial infarction of unspecified site: Secondary | ICD-10-CM | POA: Diagnosis not present

## 2021-10-13 DIAGNOSIS — Z9221 Personal history of antineoplastic chemotherapy: Secondary | ICD-10-CM

## 2021-10-13 DIAGNOSIS — I200 Unstable angina: Secondary | ICD-10-CM | POA: Diagnosis not present

## 2021-10-13 DIAGNOSIS — D72829 Elevated white blood cell count, unspecified: Secondary | ICD-10-CM | POA: Diagnosis not present

## 2021-10-13 DIAGNOSIS — I3139 Other pericardial effusion (noninflammatory): Secondary | ICD-10-CM | POA: Diagnosis present

## 2021-10-13 DIAGNOSIS — I700 Atherosclerosis of aorta: Secondary | ICD-10-CM | POA: Diagnosis not present

## 2021-10-13 DIAGNOSIS — I214 Non-ST elevation (NSTEMI) myocardial infarction: Secondary | ICD-10-CM

## 2021-10-13 DIAGNOSIS — I4510 Unspecified right bundle-branch block: Secondary | ICD-10-CM | POA: Diagnosis present

## 2021-10-13 DIAGNOSIS — I314 Cardiac tamponade: Secondary | ICD-10-CM | POA: Diagnosis present

## 2021-10-13 DIAGNOSIS — Z833 Family history of diabetes mellitus: Secondary | ICD-10-CM

## 2021-10-13 DIAGNOSIS — G4733 Obstructive sleep apnea (adult) (pediatric): Secondary | ICD-10-CM | POA: Diagnosis present

## 2021-10-13 DIAGNOSIS — R001 Bradycardia, unspecified: Secondary | ICD-10-CM | POA: Diagnosis not present

## 2021-10-13 DIAGNOSIS — I25110 Atherosclerotic heart disease of native coronary artery with unstable angina pectoris: Secondary | ICD-10-CM | POA: Diagnosis present

## 2021-10-13 DIAGNOSIS — D62 Acute posthemorrhagic anemia: Secondary | ICD-10-CM | POA: Diagnosis not present

## 2021-10-13 DIAGNOSIS — E785 Hyperlipidemia, unspecified: Secondary | ICD-10-CM | POA: Diagnosis not present

## 2021-10-13 DIAGNOSIS — I350 Nonrheumatic aortic (valve) stenosis: Secondary | ICD-10-CM | POA: Diagnosis not present

## 2021-10-13 DIAGNOSIS — D75838 Other thrombocytosis: Secondary | ICD-10-CM | POA: Diagnosis not present

## 2021-10-13 DIAGNOSIS — Z951 Presence of aortocoronary bypass graft: Secondary | ICD-10-CM

## 2021-10-13 DIAGNOSIS — R55 Syncope and collapse: Secondary | ICD-10-CM | POA: Diagnosis present

## 2021-10-13 DIAGNOSIS — R072 Precordial pain: Secondary | ICD-10-CM | POA: Diagnosis not present

## 2021-10-13 DIAGNOSIS — Z87891 Personal history of nicotine dependence: Secondary | ICD-10-CM | POA: Diagnosis not present

## 2021-10-13 DIAGNOSIS — N170 Acute kidney failure with tubular necrosis: Secondary | ICD-10-CM | POA: Diagnosis not present

## 2021-10-13 DIAGNOSIS — Z8261 Family history of arthritis: Secondary | ICD-10-CM

## 2021-10-13 DIAGNOSIS — I2510 Atherosclerotic heart disease of native coronary artery without angina pectoris: Secondary | ICD-10-CM | POA: Diagnosis present

## 2021-10-13 DIAGNOSIS — R911 Solitary pulmonary nodule: Secondary | ICD-10-CM | POA: Diagnosis not present

## 2021-10-13 DIAGNOSIS — I2119 ST elevation (STEMI) myocardial infarction involving other coronary artery of inferior wall: Secondary | ICD-10-CM | POA: Diagnosis not present

## 2021-10-13 DIAGNOSIS — Z8249 Family history of ischemic heart disease and other diseases of the circulatory system: Secondary | ICD-10-CM

## 2021-10-13 DIAGNOSIS — Z9989 Dependence on other enabling machines and devices: Secondary | ICD-10-CM | POA: Diagnosis not present

## 2021-10-13 DIAGNOSIS — R9431 Abnormal electrocardiogram [ECG] [EKG]: Secondary | ICD-10-CM

## 2021-10-13 DIAGNOSIS — I080 Rheumatic disorders of both mitral and aortic valves: Secondary | ICD-10-CM | POA: Diagnosis not present

## 2021-10-13 DIAGNOSIS — I493 Ventricular premature depolarization: Secondary | ICD-10-CM | POA: Diagnosis not present

## 2021-10-13 DIAGNOSIS — I2583 Coronary atherosclerosis due to lipid rich plaque: Secondary | ICD-10-CM | POA: Diagnosis not present

## 2021-10-13 DIAGNOSIS — J9601 Acute respiratory failure with hypoxia: Secondary | ICD-10-CM | POA: Diagnosis not present

## 2021-10-13 DIAGNOSIS — I480 Paroxysmal atrial fibrillation: Secondary | ICD-10-CM | POA: Diagnosis not present

## 2021-10-13 DIAGNOSIS — R0602 Shortness of breath: Secondary | ICD-10-CM | POA: Diagnosis not present

## 2021-10-13 DIAGNOSIS — K219 Gastro-esophageal reflux disease without esophagitis: Secondary | ICD-10-CM | POA: Diagnosis present

## 2021-10-13 DIAGNOSIS — Z79899 Other long term (current) drug therapy: Secondary | ICD-10-CM

## 2021-10-13 DIAGNOSIS — Z8501 Personal history of malignant neoplasm of esophagus: Secondary | ICD-10-CM

## 2021-10-13 DIAGNOSIS — I10 Essential (primary) hypertension: Secondary | ICD-10-CM | POA: Diagnosis present

## 2021-10-13 DIAGNOSIS — J9811 Atelectasis: Secondary | ICD-10-CM | POA: Diagnosis not present

## 2021-10-13 DIAGNOSIS — E119 Type 2 diabetes mellitus without complications: Secondary | ICD-10-CM | POA: Diagnosis not present

## 2021-10-13 DIAGNOSIS — I2111 ST elevation (STEMI) myocardial infarction involving right coronary artery: Secondary | ICD-10-CM | POA: Diagnosis not present

## 2021-10-13 DIAGNOSIS — I252 Old myocardial infarction: Secondary | ICD-10-CM

## 2021-10-13 DIAGNOSIS — Z85028 Personal history of other malignant neoplasm of stomach: Secondary | ICD-10-CM | POA: Diagnosis not present

## 2021-10-13 DIAGNOSIS — E11649 Type 2 diabetes mellitus with hypoglycemia without coma: Secondary | ICD-10-CM | POA: Diagnosis not present

## 2021-10-13 DIAGNOSIS — I340 Nonrheumatic mitral (valve) insufficiency: Secondary | ICD-10-CM

## 2021-10-13 DIAGNOSIS — Z7982 Long term (current) use of aspirin: Secondary | ICD-10-CM

## 2021-10-13 DIAGNOSIS — Z01810 Encounter for preprocedural cardiovascular examination: Secondary | ICD-10-CM | POA: Diagnosis not present

## 2021-10-13 DIAGNOSIS — J90 Pleural effusion, not elsewhere classified: Secondary | ICD-10-CM | POA: Diagnosis not present

## 2021-10-13 DIAGNOSIS — Z7984 Long term (current) use of oral hypoglycemic drugs: Secondary | ICD-10-CM | POA: Diagnosis not present

## 2021-10-13 DIAGNOSIS — E1159 Type 2 diabetes mellitus with other circulatory complications: Secondary | ICD-10-CM | POA: Diagnosis not present

## 2021-10-13 DIAGNOSIS — Z952 Presence of prosthetic heart valve: Secondary | ICD-10-CM | POA: Diagnosis not present

## 2021-10-13 HISTORY — PX: CORONARY BALLOON ANGIOPLASTY: CATH118233

## 2021-10-13 HISTORY — PX: LEFT HEART CATH AND CORONARY ANGIOGRAPHY: CATH118249

## 2021-10-13 LAB — I-STAT CHEM 8, ED
BUN: 15 mg/dL (ref 8–23)
Calcium, Ion: 1.17 mmol/L (ref 1.15–1.40)
Chloride: 106 mmol/L (ref 98–111)
Creatinine, Ser: 1.6 mg/dL — ABNORMAL HIGH (ref 0.61–1.24)
Glucose, Bld: 138 mg/dL — ABNORMAL HIGH (ref 70–99)
HCT: 47 % (ref 39.0–52.0)
Hemoglobin: 16 g/dL (ref 13.0–17.0)
Potassium: 3.9 mmol/L (ref 3.5–5.1)
Sodium: 140 mmol/L (ref 135–145)
TCO2: 21 mmol/L — ABNORMAL LOW (ref 22–32)

## 2021-10-13 LAB — URINALYSIS, ROUTINE W REFLEX MICROSCOPIC
Bilirubin Urine: NEGATIVE
Glucose, UA: NEGATIVE mg/dL
Hgb urine dipstick: NEGATIVE
Ketones, ur: NEGATIVE mg/dL
Leukocytes,Ua: NEGATIVE
Nitrite: NEGATIVE
Protein, ur: NEGATIVE mg/dL
Specific Gravity, Urine: 1.01 (ref 1.005–1.030)
pH: 7 (ref 5.0–8.0)

## 2021-10-13 LAB — ECHOCARDIOGRAM COMPLETE
AR max vel: 1.73 cm2
AV Area VTI: 1.81 cm2
AV Area mean vel: 1.67 cm2
AV Mean grad: 3 mmHg
AV Peak grad: 6.4 mmHg
Ao pk vel: 1.26 m/s
Area-P 1/2: 3.6 cm2
Height: 70 in
MV M vel: 4.66 m/s
MV Peak grad: 86.9 mmHg
S' Lateral: 2.8 cm
Weight: 3040 oz

## 2021-10-13 LAB — BASIC METABOLIC PANEL
Anion gap: 7 (ref 5–15)
BUN: 13 mg/dL (ref 8–23)
CO2: 22 mmol/L (ref 22–32)
Calcium: 8.6 mg/dL — ABNORMAL LOW (ref 8.9–10.3)
Chloride: 108 mmol/L (ref 98–111)
Creatinine, Ser: 1.49 mg/dL — ABNORMAL HIGH (ref 0.61–1.24)
GFR, Estimated: 48 mL/min — ABNORMAL LOW (ref >60)
Glucose, Bld: 123 mg/dL — ABNORMAL HIGH (ref 70–99)
Potassium: 4.6 mmol/L (ref 3.5–5.1)
Sodium: 137 mmol/L (ref 135–145)

## 2021-10-13 LAB — COMPREHENSIVE METABOLIC PANEL
ALT: 19 U/L (ref 0–44)
AST: 30 U/L (ref 15–41)
Albumin: 4.2 g/dL (ref 3.5–5.0)
Alkaline Phosphatase: 45 U/L (ref 38–126)
Anion gap: 10 (ref 5–15)
BUN: 16 mg/dL (ref 8–23)
CO2: 25 mmol/L (ref 22–32)
Calcium: 9.9 mg/dL (ref 8.9–10.3)
Chloride: 104 mmol/L (ref 98–111)
Creatinine, Ser: 1.87 mg/dL — ABNORMAL HIGH (ref 0.61–1.24)
GFR, Estimated: 37 mL/min — ABNORMAL LOW (ref >60)
Glucose, Bld: 123 mg/dL — ABNORMAL HIGH (ref 70–99)
Potassium: 4.1 mmol/L (ref 3.5–5.1)
Sodium: 139 mmol/L (ref 135–145)
Total Bilirubin: 0.7 mg/dL (ref 0.3–1.2)
Total Protein: 6.9 g/dL (ref 6.5–8.1)

## 2021-10-13 LAB — POCT I-STAT 7, (LYTES, BLD GAS, ICA,H+H)
Acid-base deficit: 4 mmol/L — ABNORMAL HIGH (ref 0.0–2.0)
Acid-base deficit: 5 mmol/L — ABNORMAL HIGH (ref 0.0–2.0)
Bicarbonate: 21.1 mmol/L (ref 20.0–28.0)
Bicarbonate: 22.5 mmol/L (ref 20.0–28.0)
Calcium, Ion: 1.14 mmol/L — ABNORMAL LOW (ref 1.15–1.40)
Calcium, Ion: 1.17 mmol/L (ref 1.15–1.40)
HCT: 40 % (ref 39.0–52.0)
HCT: 41 % (ref 39.0–52.0)
Hemoglobin: 13.6 g/dL (ref 13.0–17.0)
Hemoglobin: 13.9 g/dL (ref 13.0–17.0)
O2 Saturation: 100 %
O2 Saturation: 76 %
Potassium: 4.5 mmol/L (ref 3.5–5.1)
Potassium: 4.8 mmol/L (ref 3.5–5.1)
Sodium: 139 mmol/L (ref 135–145)
Sodium: 140 mmol/L (ref 135–145)
TCO2: 22 mmol/L (ref 22–32)
TCO2: 24 mmol/L (ref 22–32)
pCO2 arterial: 39.4 mmHg (ref 32–48)
pCO2 arterial: 48.3 mmHg — ABNORMAL HIGH (ref 32–48)
pH, Arterial: 7.276 — ABNORMAL LOW (ref 7.35–7.45)
pH, Arterial: 7.336 — ABNORMAL LOW (ref 7.35–7.45)
pO2, Arterial: 271 mmHg — ABNORMAL HIGH (ref 83–108)
pO2, Arterial: 46 mmHg — ABNORMAL LOW (ref 83–108)

## 2021-10-13 LAB — POCT I-STAT EG7
Acid-base deficit: 6 mmol/L — ABNORMAL HIGH (ref 0.0–2.0)
Bicarbonate: 19.2 mmol/L — ABNORMAL LOW (ref 20.0–28.0)
Calcium, Ion: 1.18 mmol/L (ref 1.15–1.40)
HCT: 42 % (ref 39.0–52.0)
Hemoglobin: 14.3 g/dL (ref 13.0–17.0)
O2 Saturation: 96 %
Potassium: 5 mmol/L (ref 3.5–5.1)
Sodium: 139 mmol/L (ref 135–145)
TCO2: 20 mmol/L — ABNORMAL LOW (ref 22–32)
pCO2, Ven: 35.4 mmHg — ABNORMAL LOW (ref 44–60)
pH, Ven: 7.343 (ref 7.25–7.43)
pO2, Ven: 83 mmHg — ABNORMAL HIGH (ref 32–45)

## 2021-10-13 LAB — CBC WITH DIFFERENTIAL/PLATELET
Abs Immature Granulocytes: 0.04 10*3/uL (ref 0.00–0.07)
Basophils Absolute: 0.1 10*3/uL (ref 0.0–0.1)
Basophils Relative: 1 %
Eosinophils Absolute: 0.4 10*3/uL (ref 0.0–0.5)
Eosinophils Relative: 5 %
HCT: 47.4 % (ref 39.0–52.0)
Hemoglobin: 15.7 g/dL (ref 13.0–17.0)
Immature Granulocytes: 0 %
Lymphocytes Relative: 42 %
Lymphs Abs: 3.8 10*3/uL (ref 0.7–4.0)
MCH: 29.3 pg (ref 26.0–34.0)
MCHC: 33.1 g/dL (ref 30.0–36.0)
MCV: 88.4 fL (ref 80.0–100.0)
Monocytes Absolute: 1 10*3/uL (ref 0.1–1.0)
Monocytes Relative: 11 %
Neutro Abs: 3.6 10*3/uL (ref 1.7–7.7)
Neutrophils Relative %: 41 %
Platelets: 362 10*3/uL (ref 150–400)
RBC: 5.36 MIL/uL (ref 4.22–5.81)
RDW: 14.9 % (ref 11.5–15.5)
WBC: 9 10*3/uL (ref 4.0–10.5)
nRBC: 0 % (ref 0.0–0.2)

## 2021-10-13 LAB — TROPONIN I (HIGH SENSITIVITY)
Troponin I (High Sensitivity): 11 ng/L (ref <18)
Troponin I (High Sensitivity): 397 ng/L (ref <18)
Troponin I (High Sensitivity): 1101 ng/L (ref <18)
Troponin I (High Sensitivity): 9868 ng/L (ref <18)
Troponin I (High Sensitivity): 9340 ng/L (ref <18)
Troponin I (High Sensitivity): 15166 ng/L (ref <18)

## 2021-10-13 LAB — CBG MONITORING, ED
Glucose-Capillary: 111 mg/dL — ABNORMAL HIGH (ref 70–99)
Glucose-Capillary: 118 mg/dL — ABNORMAL HIGH (ref 70–99)

## 2021-10-13 LAB — TSH: TSH: 0.466 u[IU]/mL (ref 0.350–4.500)

## 2021-10-13 LAB — MRSA NEXT GEN BY PCR, NASAL: MRSA by PCR Next Gen: NOT DETECTED

## 2021-10-13 LAB — HEMOGLOBIN AND HEMATOCRIT, BLOOD
HCT: 45.6 % (ref 39.0–52.0)
Hemoglobin: 15.3 g/dL (ref 13.0–17.0)

## 2021-10-13 LAB — POCT ACTIVATED CLOTTING TIME
Activated Clotting Time: 251 seconds
Activated Clotting Time: 239 seconds

## 2021-10-13 LAB — GLUCOSE, CAPILLARY: Glucose-Capillary: 110 mg/dL — ABNORMAL HIGH (ref 70–99)

## 2021-10-13 LAB — LIPASE, BLOOD: Lipase: 32 U/L (ref 11–51)

## 2021-10-13 SURGERY — LEFT HEART CATH AND CORONARY ANGIOGRAPHY
Anesthesia: LOCAL

## 2021-10-13 MED ORDER — HYDROCODONE-ACETAMINOPHEN 5-325 MG PO TABS: 1.0000 | ORAL_TABLET | Freq: Four times a day (QID) | ORAL | Status: DC | PRN

## 2021-10-13 MED ORDER — HEPARIN (PORCINE) IN NACL 1000-0.9 UT/500ML-% IV SOLN
INTRAVENOUS | Status: DC | PRN
  Administered 2021-10-13 (×2): 500 mL

## 2021-10-13 MED ORDER — SODIUM CHLORIDE 0.9 % IV BOLUS
INTRAVENOUS | Status: AC | PRN
  Administered 2021-10-13: 250 mL via INTRAVENOUS

## 2021-10-13 MED ORDER — ASPIRIN 81 MG PO CHEW: 81.0000 mg | CHEWABLE_TABLET | ORAL | Status: DC

## 2021-10-13 MED ORDER — HEPARIN SODIUM (PORCINE) 1000 UNIT/ML IJ SOLN
INTRAMUSCULAR | Status: AC
  Filled 2021-10-13: qty 10

## 2021-10-13 MED ORDER — NOREPINEPHRINE 4 MG/250ML-% IV SOLN
INTRAVENOUS | Status: AC
  Filled 2021-10-13: qty 250

## 2021-10-13 MED ORDER — SODIUM CHLORIDE 0.9% FLUSH: 3.0000 mL | Freq: Two times a day (BID) | INTRAVENOUS | Status: DC

## 2021-10-13 MED ORDER — ASPIRIN 81 MG PO CHEW
324.0000 mg | CHEWABLE_TABLET | Freq: Once | ORAL | Status: AC
  Administered 2021-10-13: 324 mg via ORAL
  Filled 2021-10-13: qty 4

## 2021-10-13 MED ORDER — NITROGLYCERIN 1 MG/10 ML FOR IR/CATH LAB
INTRA_ARTERIAL | Status: AC
  Filled 2021-10-13: qty 10

## 2021-10-13 MED ORDER — INSULIN ASPART 100 UNIT/ML IJ SOLN
0.0000 [IU] | Freq: Three times a day (TID) | INTRAMUSCULAR | Status: DC
  Administered 2021-10-16: 3 [IU] via SUBCUTANEOUS
  Administered 2021-10-16 – 2021-10-17 (×2): 2 [IU] via SUBCUTANEOUS

## 2021-10-13 MED ORDER — HEPARIN BOLUS VIA INFUSION
5000.0000 [IU] | Freq: Once | INTRAVENOUS | Status: AC
  Administered 2021-10-13: 5000 [IU] via INTRAVENOUS
  Filled 2021-10-13: qty 5000

## 2021-10-13 MED ORDER — LORATADINE 10 MG PO TABS: 10.0000 mg | ORAL_TABLET | Freq: Every day | ORAL | Status: DC | PRN

## 2021-10-13 MED ORDER — IPRATROPIUM-ALBUTEROL 0.5-2.5 (3) MG/3ML IN SOLN
3.0000 mL | Freq: Once | RESPIRATORY_TRACT | Status: AC
  Administered 2021-10-13: 3 mL via RESPIRATORY_TRACT
  Filled 2021-10-13: qty 3

## 2021-10-13 MED ORDER — FENOFIBRATE 160 MG PO TABS
160.0000 mg | ORAL_TABLET | Freq: Every day | ORAL | Status: DC
  Administered 2021-10-14 – 2021-10-16 (×3): 160 mg via ORAL
  Filled 2021-10-13 (×4): qty 1

## 2021-10-13 MED ORDER — PANTOPRAZOLE SODIUM 40 MG PO TBEC
40.0000 mg | DELAYED_RELEASE_TABLET | Freq: Every day | ORAL | Status: DC
  Administered 2021-10-14 – 2021-10-21 (×8): 40 mg via ORAL
  Filled 2021-10-13 (×8): qty 1

## 2021-10-13 MED ORDER — HYDRALAZINE HCL 20 MG/ML IJ SOLN: 10.0000 mg | INTRAMUSCULAR | Status: AC | PRN

## 2021-10-13 MED ORDER — FENTANYL CITRATE (PF) 100 MCG/2ML IJ SOLN
INTRAMUSCULAR | Status: DC | PRN
  Administered 2021-10-13: 25 ug via INTRAVENOUS

## 2021-10-13 MED ORDER — MIDAZOLAM HCL 2 MG/2ML IJ SOLN
INTRAMUSCULAR | Status: AC
  Filled 2021-10-13: qty 2

## 2021-10-13 MED ORDER — FENOFIBRATE 134 MG PO CAPS: 134.0000 mg | ORAL_CAPSULE | Freq: Every day | ORAL | Status: DC

## 2021-10-13 MED ORDER — SODIUM CHLORIDE 0.9 % IV BOLUS
1000.0000 mL | Freq: Once | INTRAVENOUS | Status: AC
  Administered 2021-10-13: 1000 mL via INTRAVENOUS

## 2021-10-13 MED ORDER — SODIUM CHLORIDE 0.9 % IV BOLUS
250.0000 mL | Freq: Once | INTRAVENOUS | Status: AC
  Administered 2021-10-13: 250 mL via INTRAVENOUS

## 2021-10-13 MED ORDER — NOREPINEPHRINE BITARTRATE 1 MG/ML IV SOLN
INTRAVENOUS | Status: AC | PRN
  Administered 2021-10-13: 10 ug/min via INTRAVENOUS

## 2021-10-13 MED ORDER — HEPARIN SODIUM (PORCINE) 1000 UNIT/ML IJ SOLN
INTRAMUSCULAR | Status: DC | PRN
  Administered 2021-10-13: 5000 [IU] via INTRAVENOUS
  Administered 2021-10-13 (×2): 3000 [IU] via INTRAVENOUS

## 2021-10-13 MED ORDER — HEPARIN (PORCINE) IN NACL 1000-0.9 UT/500ML-% IV SOLN
INTRAVENOUS | Status: AC
  Filled 2021-10-13: qty 500

## 2021-10-13 MED ORDER — ACETAMINOPHEN 325 MG PO TABS: 650.0000 mg | ORAL_TABLET | ORAL | Status: DC | PRN

## 2021-10-13 MED ORDER — CHLORHEXIDINE GLUCONATE CLOTH 2 % EX PADS
6.0000 | MEDICATED_PAD | Freq: Every day | CUTANEOUS | Status: DC
  Administered 2021-10-13: 6 via TOPICAL

## 2021-10-13 MED ORDER — TIROFIBAN HCL IV 12.5 MG/250 ML
0.0750 ug/kg/min | INTRAVENOUS | Status: DC
  Filled 2021-10-13: qty 250

## 2021-10-13 MED ORDER — LIDOCAINE HCL (PF) 1 % IJ SOLN
INTRAMUSCULAR | Status: DC | PRN
  Administered 2021-10-13: 2 mL via INTRADERMAL
  Administered 2021-10-13: 10 mL via INTRADERMAL

## 2021-10-13 MED ORDER — ASPIRIN 81 MG PO CHEW
81.0000 mg | CHEWABLE_TABLET | Freq: Every day | ORAL | Status: DC
  Administered 2021-10-14 – 2021-10-21 (×7): 81 mg via ORAL
  Filled 2021-10-13 (×7): qty 1

## 2021-10-13 MED ORDER — ASPIRIN 325 MG PO TBEC: 325.0000 mg | DELAYED_RELEASE_TABLET | Freq: Every day | ORAL | Status: DC

## 2021-10-13 MED ORDER — IOHEXOL 350 MG/ML SOLN
INTRAVENOUS | Status: DC | PRN
  Administered 2021-10-13: 130 mL

## 2021-10-13 MED ORDER — SODIUM CHLORIDE 0.9 % IV SOLN: 250.0000 mL | INTRAVENOUS | Status: DC | PRN

## 2021-10-13 MED ORDER — LABETALOL HCL 5 MG/ML IV SOLN: 10.0000 mg | INTRAVENOUS | Status: AC | PRN

## 2021-10-13 MED ORDER — ATROPINE SULFATE 1 MG/10ML IJ SOSY
PREFILLED_SYRINGE | INTRAMUSCULAR | Status: DC | PRN
  Administered 2021-10-13: 1 mg via INTRAVENOUS

## 2021-10-13 MED ORDER — SODIUM CHLORIDE 0.9 % WEIGHT BASED INFUSION
3.0000 mL/kg/h | INTRAVENOUS | Status: DC
  Administered 2021-10-13: 3 mL/kg/h via INTRAVENOUS

## 2021-10-13 MED ORDER — TIROFIBAN HCL IN NACL 5-0.9 MG/100ML-% IV SOLN
INTRAVENOUS | Status: AC
  Filled 2021-10-13: qty 100

## 2021-10-13 MED ORDER — VERAPAMIL HCL 2.5 MG/ML IV SOLN
INTRAVENOUS | Status: AC
  Filled 2021-10-13: qty 2

## 2021-10-13 MED ORDER — SODIUM CHLORIDE 0.9 % IV SOLN: INTRAVENOUS | Status: AC

## 2021-10-13 MED ORDER — SODIUM CHLORIDE 0.9% FLUSH
3.0000 mL | Freq: Two times a day (BID) | INTRAVENOUS | Status: DC
  Administered 2021-10-14 – 2021-10-21 (×10): 3 mL via INTRAVENOUS

## 2021-10-13 MED ORDER — TICAGRELOR 90 MG PO TABS
180.0000 mg | ORAL_TABLET | Freq: Once | ORAL | Status: AC
  Administered 2021-10-13: 180 mg via ORAL
  Filled 2021-10-13: qty 2

## 2021-10-13 MED ORDER — TICAGRELOR 90 MG PO TABS
90.0000 mg | ORAL_TABLET | Freq: Two times a day (BID) | ORAL | Status: DC
  Administered 2021-10-14 – 2021-10-15 (×3): 90 mg via ORAL
  Filled 2021-10-13 (×3): qty 1

## 2021-10-13 MED ORDER — "THROMBI-PAD 3""X3"" EX PADS"
1.0000 | MEDICATED_PAD | Freq: Once | CUTANEOUS | Status: AC
  Administered 2021-10-13: 1 via TOPICAL

## 2021-10-13 MED ORDER — SODIUM CHLORIDE 0.9% FLUSH: 3.0000 mL | INTRAVENOUS | Status: DC | PRN

## 2021-10-13 MED ORDER — SODIUM CHLORIDE 0.9 % IV BOLUS
500.0000 mL | Freq: Once | INTRAVENOUS | Status: AC
  Administered 2021-10-13: 500 mL via INTRAVENOUS

## 2021-10-13 MED ORDER — ONDANSETRON HCL 4 MG/2ML IJ SOLN: 4.0000 mg | Freq: Four times a day (QID) | INTRAMUSCULAR | Status: DC | PRN

## 2021-10-13 MED ORDER — ATROPINE SULFATE 1 MG/10ML IJ SOSY: 0.5000 mg | PREFILLED_SYRINGE | Freq: Once | INTRAMUSCULAR | Status: DC

## 2021-10-13 MED ORDER — SODIUM CHLORIDE 0.9 % WEIGHT BASED INFUSION: 1.0000 mL/kg/h | INTRAVENOUS | Status: DC

## 2021-10-13 MED ORDER — HEPARIN (PORCINE) 25000 UT/250ML-% IV SOLN
1200.0000 [IU]/h | INTRAVENOUS | Status: DC
  Administered 2021-10-13: 1200 [IU]/h via INTRAVENOUS
  Filled 2021-10-13: qty 250

## 2021-10-13 MED ORDER — VERAPAMIL HCL 2.5 MG/ML IV SOLN
INTRAVENOUS | Status: DC | PRN
  Administered 2021-10-13: 10 mL via INTRA_ARTERIAL

## 2021-10-13 MED ORDER — MIDAZOLAM HCL 2 MG/2ML IJ SOLN
INTRAMUSCULAR | Status: DC | PRN
  Administered 2021-10-13: 1 mg via INTRAVENOUS

## 2021-10-13 MED ORDER — LIDOCAINE HCL (PF) 1 % IJ SOLN
INTRAMUSCULAR | Status: AC
  Filled 2021-10-13: qty 30

## 2021-10-13 MED ORDER — TIROFIBAN (AGGRASTAT) BOLUS VIA INFUSION
INTRAVENOUS | Status: DC | PRN
  Administered 2021-10-13: 2155 ug via INTRAVENOUS

## 2021-10-13 MED ORDER — TIROFIBAN HCL IN NACL 5-0.9 MG/100ML-% IV SOLN
INTRAVENOUS | Status: AC | PRN
  Administered 2021-10-13: .15 ug/kg/min via INTRAVENOUS

## 2021-10-13 MED ORDER — ATORVASTATIN CALCIUM 80 MG PO TABS
80.0000 mg | ORAL_TABLET | Freq: Every day | ORAL | Status: DC
  Administered 2021-10-14 – 2021-11-02 (×19): 80 mg via ORAL
  Filled 2021-10-13 (×19): qty 1

## 2021-10-13 MED ORDER — FENTANYL CITRATE (PF) 100 MCG/2ML IJ SOLN
INTRAMUSCULAR | Status: AC
  Filled 2021-10-13: qty 2

## 2021-10-13 SURGICAL SUPPLY — 22 items
BALLN SAPPHIRE 2.0X12 (BALLOONS) ×1
BALLOON SAPPHIRE 2.0X12 (BALLOONS) IMPLANT
CATH 5FR JL3.5 JR4 ANG PIG MP (CATHETERS) IMPLANT
CATH EXTRAC PRONTO 5.5F 138CM (CATHETERS) IMPLANT
CATH LAUNCHER 6FR AL.75 (CATHETERS) IMPLANT
CATH VISTA GUIDE 6FR AL1 (CATHETERS) IMPLANT
CATH VISTA GUIDE 6FR JR4 (CATHETERS) IMPLANT
DEVICE RAD COMP TR BAND LRG (VASCULAR PRODUCTS) IMPLANT
GLIDESHEATH SLEND SS 6F .021 (SHEATH) IMPLANT
GUIDEWIRE INQWIRE 1.5J.035X260 (WIRE) IMPLANT
INQWIRE 1.5J .035X260CM (WIRE) ×1
KIT ENCORE 26 ADVANTAGE (KITS) IMPLANT
KIT HEART LEFT (KITS) ×2 IMPLANT
KIT MICROPUNCTURE NIT STIFF (SHEATH) IMPLANT
PACK CARDIAC CATHETERIZATION (CUSTOM PROCEDURE TRAY) ×2 IMPLANT
SHEATH PINNACLE 8F 10CM (SHEATH) IMPLANT
SHEATH PROBE COVER 6X72 (BAG) IMPLANT
TRANSDUCER W/STOPCOCK (MISCELLANEOUS) ×2 IMPLANT
TUBING CIL FLEX 10 FLL-RA (TUBING) ×2 IMPLANT
WIRE HI TORQ VERSACORE-J 145CM (WIRE) IMPLANT
WIRE HI TORQ WHISPER MS 190CM (WIRE) IMPLANT
WIRE RUNTHROUGH .014X180CM (WIRE) IMPLANT

## 2021-10-13 NOTE — Progress Notes (Signed)
Patient placed on CPAP with 3 L oxygen at this time.

## 2021-10-13 NOTE — ED Notes (Signed)
Pt returns to room 26, pale diaphoretic.  MD to bedside for assessment.

## 2021-10-13 NOTE — Progress Notes (Signed)
Cath Lab/IC Note:  Called to see pt due to oozing around venous sheath in right IJ. Nursing holding pressure when I arrived. Aggrastat drip stopped. With continued oozing around the sheath, we have elected to remove his IJ sheath and infuse the low dose Levophed through the peripheral IV. He had been off of Levophed prior to the manual pressure being applied to his neck at which time low dose Levophed was restarted.   Lauree Chandler, MD, [facility] 10/13/2021 6:20 PM

## 2021-10-13 NOTE — Progress Notes (Signed)
Received patient from cath lab. 1715: Right IJ venous sheath oozing and has hematoma. MD Keturah Barre, MD [facility] to bedside. Per MD Burt Knack, discontinue Aggrastat drip Pressure held to site for 20 minutes, oozing continued. MD McAlhany returned to bedside. 1755: Orders to remove sheath, pressure help for 30 minutes post sheath removal and pressure dressing applied. 1845: Site continued to ooze, MD Gardiner Rhyme notified. Orders for thrombi pad to be placed.  Patient hemodynamically stable, AOx4.

## 2021-10-13 NOTE — Interval H&P Note (Signed)
Cath Lab Visit (complete for each Cath Lab visit)  Clinical Evaluation Leading to the Procedure:   ACS: Yes.    Non-ACS:    Anginal Classification: CCS IV  Anti-ischemic medical therapy: No Therapy  Non-Invasive Test Results: No non-invasive testing performed  Prior CABG: No previous CABG      History and Physical Interval Note:  10/13/2021 2:50 PM  Jon [name]  has presented today for surgery, with the diagnosis of nstemi.  The various methods of treatment have been discussed with the patient and family. After consideration of risks, benefits and other options for treatment, the patient has consented to  Procedure(s): LEFT HEART CATH AND CORONARY ANGIOGRAPHY (N/A) as a surgical intervention.  The patient's history has been reviewed, patient examined, no change in status, stable for surgery.  I have reviewed the patient's chart and labs.  Questions were answered to the patient's satisfaction.     Sherren Mocha

## 2021-10-13 NOTE — H&P (View-Only) (Signed)
Cardiology Consultation   Patient ID: Jon [name] MRN: [id number]; DOB: 16-Jan-1946  Admit date: 10/13/2021 Date of Consult: 10/13/2021  PCP:  [name], MD   [facility] Providers Cardiologist: New   Patient Profile:   Jon [name] is a 76 y.o. male with a hx of paroxysmal atrial fibrillation, hypertension, diabetes, obviously sleep apnea on CPAP and prior tobacco smoking who is being seen 10/13/2021 for the evaluation of dizziness and near syncope at the request of Dr. Roosevelt Locks.   Patient with remote history of atrial fibrillation with rapid ventricular rate about 10 to 15 years ago.  He reports this was resolved after placed on CPAP for sleep apnea.  Also carries diagnosis of hypertension and diabetes but denies taking any medication.  40-pack-year tobacco smoking history.  Quit 5 years ago. Brother had an MI at age 13 requiring stenting.  He also has pacemaker/ICD. He drinks alcohol twice per week. Walks 3 miles every morning without any problem.  History of Present Illness:   Jon [name] was in usual state of health up until this morning when woke up from sleep at 4 AM with substernal burning chest discomfort.  Associated with shortness of breath.  Felt weak and fatigued.  Came to ER for further evaluation.  He was found in A-fib with slow ventricular rate.  Initial troponin negative but then trended up to 300.  He signed AMA, while walking in triage he had a severe episode of dizziness with diaphoresis and shortness of breath.  Brought him back in room.  Troponin trended up at 1100.  Cardiology is consulted for evaluation.  Currently chest pain-free.  He is hypotensive.   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 pylori infection 2011   Heart murmur    History of radiation therapy 09/20/10 thru 10/29/10   gastroesophageal junction/gastric cardia   Hypertension    Neuromuscular disorder ([place])    Other and unspecified hyperlipidemia    Sleep apnea    On CPAP   Status post chemotherapy 09/22/10 thru 10/27/10   concurrent w/radiation   Unspecified sleep apnea     Past Surgical History:  Procedure Laterality Date   CATARACT EXTRACTION     bilateral   COLONOSCOPY     ELBOW BURSA SURGERY     LEG SURGERY  1980   right leg    SKIN BIOPSY  08/22/11   shave biopsy =benign   TONSILLECTOMY       Inpatient Medications: Scheduled Meds:  [START ON 10/14/2021] aspirin  81 mg Oral Pre-Cath   aspirin EC  325 mg Oral Daily   atropine  0.5 mg Intravenous Once   fenofibrate micronized  134 mg Oral Q breakfast   insulin aspart  0-15 Units Subcutaneous TID WC   pantoprazole  40 mg Oral Daily   sodium chloride flush  3 mL Intravenous Q12H   sodium chloride flush  3 mL Intravenous Q12H   Continuous Infusions:  sodium chloride 125 mL/hr at 10/13/21 0915   sodium chloride     Followed by  sodium chloride     heparin 1,200 Units/hr (10/13/21 0905)   PRN Meds: HYDROcodone-acetaminophen, loratadine  Allergies:   No Known Allergies  Social History:   Social History   Socioeconomic History   Marital status: Widowed    Spouse name: Not on file   Number of children: 2   Years of education: Not on file   Highest education level: Not on file  Occupational History   Occupation: retired    Fish farm manager: RETIRED  Tobacco Use   Smoking status: Former    Packs/day: 1.00    Years: 40.00    Total pack years: 40.00    Types: Cigarettes    Quit date: 01/31/2005    Years since quitting: 16.7   Smokeless tobacco: Never  Substance and Sexual Activity    Alcohol use: Yes    Alcohol/week: 0.0 standard drinks of alcohol    Comment: 2 drinks last 6 months per pt   Drug use: No   Sexual activity: Yes  Other Topics Concern   Not on file  Social History Narrative   Not on file   Social Determinants of Health   Financial Resource Strain: Low Risk  (08/31/2021)   Overall Financial Resource Strain (CARDIA)    Difficulty of Paying Living Expenses: Not hard at all  Food Insecurity: No Food Insecurity (08/31/2021)   Hunger Vital Sign    Worried About Running Out of Food in the Last Year: Never true    Ran Out of Food in the Last Year: Never true  Transportation Needs: No Transportation Needs (08/31/2021)   PRAPARE - Hydrologist (Medical): No    Lack of Transportation (Non-Medical): No  Physical Activity: Sufficiently Active (08/31/2021)   Exercise Vital Sign    Days of Exercise per Week: 3 days    Minutes of Exercise per Session: 60 min  Stress: No Stress Concern Present (08/31/2021)   [place]    Feeling of Stress : Not at all  Social Connections: Moderately Integrated (08/31/2021)   Social Connection and Isolation Panel [NHANES]    Frequency of Communication with Friends and Family: More than three times a week    Frequency of Social Gatherings with Friends and Family: Once a week    Attends Religious Services: 1 to 4 times per year    Active Member of [employer] or Organizations: Yes    Attends Archivist Meetings: 1 to 4 times per year    Marital Status: Widowed  Intimate Partner Violence: Not At Risk (08/31/2021)   Humiliation, Afraid, Rape, and Kick questionnaire    Fear of Current or Ex-Partner: No    Emotionally Abused: No    Physically Abused: No    Sexually Abused: No    Family History:   Family History  Problem Relation Age of Onset   Arthritis Mother    Diabetes Father    Diabetes Brother    Coronary artery disease Neg Hx     Colon cancer Neg Hx    Esophageal cancer Neg Hx    Rectal cancer Neg Hx    Stomach cancer Neg Hx      ROS:  Please see the history of present illness.  All other ROS reviewed and negative.     Physical Exam/Data:   Vitals:   10/13/21 1215 10/13/21 1230 10/13/21 1245 10/13/21 1300  BP: (!) 85/65 (!) 114/98 (!) 86/63 (!) 89/70  Pulse: (!) 51 62 (!)  58 (!) 57  Resp: 20 (!) '26 18 16  '$ Temp:      TempSrc:      SpO2: 92% 98% 98% 97%  Weight:      Height:       No intake or output data in the 24 hours ending 10/13/21 1333    10/13/2021    5:03 AM 09/10/2021    8:58 AM 09/02/2021    8:38 AM  Last 3 Weights  Weight (lbs) 190 lb 198 lb 194 lb  Weight (kg) 86.183 kg 89.812 kg 87.998 kg     Body mass index is 27.26 kg/m.  General:  Well nourished, well developed, in no acute distress HEENT: normal Neck: no JVD Vascular: No carotid bruits; Distal pulses 2+ bilaterally Cardiac:  normal S1, S2; RRR;+ murmur  Lungs:  clear to auscultation bilaterally, no wheezing, rhonchi or rales  Abd: soft, nontender, no hepatomegaly  Ext: no edema Musculoskeletal:  No deformities, BUE and BLE strength normal and equal Skin: warm and dry  Neuro:  CNs 2-12 intact, no focal abnormalities noted Psych:  Normal affect   EKG:  The EKG was personally reviewed and demonstrates: Atrial fibrillation, minimal ST elevation in inferior lead, ST depression in lead V1 to V3 Telemetry:  Telemetry was personally reviewed and demonstrates: Sinus rhythm, atrial fibrillation  Relevant CV Studies: Echocardiogram pending  Laboratory Data:  High Sensitivity Troponin:   Recent Labs  Lab 10/13/21 0510 10/13/21 0714 10/13/21 0836 10/13/21 1150  TROPONINIHS 11 397* 1,101* 9,868*     Chemistry Recent Labs  Lab 10/13/21 0510 10/13/21 0845  NA 139 140  K 4.1 3.9  CL 104 106  CO2 25  --   GLUCOSE 123* 138*  BUN 16 15  CREATININE 1.87* 1.60*  CALCIUM 9.9  --   GFRNONAA 37*  --   ANIONGAP 10  --      Recent Labs  Lab 10/13/21 0510  PROT 6.9  ALBUMIN 4.2  AST 30  ALT 19  ALKPHOS 45  BILITOT 0.7   Lipids No results for input(s): "CHOL", "TRIG", "HDL", "LABVLDL", "LDLCALC", "CHOLHDL" in the last 168 hours.  Hematology Recent Labs  Lab 10/13/21 0510 10/13/21 0836 10/13/21 0845  WBC 9.0  --   --   RBC 5.36  --   --   HGB 15.7 15.3 16.0  HCT 47.4 45.6 47.0  MCV 88.4  --   --   MCH 29.3  --   --   MCHC 33.1  --   --   RDW 14.9  --   --   PLT 362  --   --    Thyroid  Recent Labs  Lab 10/13/21 0714  TSH 0.466    BNPNo results for input(s): "BNP", "PROBNP" in the last 168 hours.  DDimer No results for input(s): "DDIMER" in the last 168 hours.   Radiology/Studies:  DG Chest Port 1 View  Result Date: 10/13/2021 CLINICAL DATA:  Chest pain. EXAM: PORTABLE CHEST 1 VIEW COMPARISON:  11/23/2015 FINDINGS: 0522 hours. Low volume film. The lungs are clear without focal pneumonia, edema, pneumothorax or pleural effusion. The cardiopericardial silhouette is within normal limits for size. The visualized bony structures of the thorax are unremarkable. Telemetry leads overlie the chest. IMPRESSION: No active disease. Electronically Signed   By: Misty Stanley M.D.   On: 10/13/2021 06:03     Assessment and Plan:   Non-STEMI Classic anginal symptoms that woke him up from sleep. Repeat EKG at 8:30 am  showed minimally ST elevation in inferior leads with reciprocal ST depression in leads V1-V3.  Repeat EKG few minutes ago showing patient in sinus rhythm with persistent changes.  Does not meet STEMI criteria but concern for inferior/posterior MI. - Hs-troponin new trended up to 9868 - Continue heparin - ASA '324mg'$  given this morning - No BB given bradycardia - Start statin   -05/12/2021: Cholesterol 125; HDL 43.40; LDL Cholesterol 56; Triglycerides 128.0; VLDL 25.6  - Check lipid panel   Shared Decision Making/Informed Consent The risks [stroke (1 in 1000), death (1 in 1000), kidney  failure [usually temporary] (1 in 500), bleeding (1 in 200), allergic reaction [possibly serious] (1 in 200)], benefits (diagnostic support and management of coronary artery disease) and alternatives of a cardiac catheterization were discussed in detail with Mr. Rayman and he is willing to proceed.   2.  Paroxysmal atrial fibrillation -Patient with prior history of A-fib RVR but never placed on anticoagulation -Now presenting with symptomatic atrial fibrillation with slow ventricular rate -I think his dizzy spells due to slow rate.  Slow rate could be due to inferior MI. -Avoid AV nodal blocking agent -Continue IV heparin -He will need anticoagulation at discharge -TSH normal -Pending echocardiogram  3.  Diabetes mellitus -Hemoglobin A1c was 7.1 about a month ago -Per primary team  4.  Hypertension -Currently hypotensive  5. AKi - Scr 1.87>>1.6 - follow up closely    Risk Assessment/Risk Scores:     TIMI Risk Score for Unstable Angina or Non-ST Elevation MI:   The patient's TIMI risk score is 5, which indicates a 26% risk of all cause mortality, new or recurrent myocardial infarction or need for urgent revascularization in the next 14 days.{   CHA2DS2-VASc Score = 4   This indicates a 4.8% annual risk of stroke. The patient's score is based upon: CHF History: 0 HTN History: 1 Diabetes History: 1 Stroke History: 0 Vascular Disease History: 0 Age Score: 2 Gender Score: 0     For questions or updates, please contact [facility] Please consult [email or web address] for contact info under    Jarrett Soho, PA  10/13/2021 1:33 PM   I have personally seen and examined this patient. I agree with the assessment and plan as outlined above. 76 yo male with HTN, DM, prior tobacco abuse and sleep apnea who presented to the ED with c/o chest pain. He had dizziness and near syncope. EKG this am with inferior ST elevation, now resolved on most recent EKG. Labs  reviewed. Troponin up to 9800.  EKG reviewed My exam:  General: Well developed, well nourished, NAD  HEENT: OP clear, mucus membranes moist  SKIN: warm, dry. No rashes. Neuro: No focal deficits  Musculoskeletal: Muscle strength 5/5 all ext  Psychiatric: Mood and affect normal  Neck: No JVD, no carotid bruits, no thyromegaly, no lymphadenopathy.  Lungs:Clear bilaterally, no wheezes, rhonci, crackles Cardiovascular: Regular rate and rhythm. No murmurs, gallops or rubs. Abdomen:Soft. Bowel sounds present. Non-tender.  Extremities: No lower extremity edema. Pulses are 2 + in the bilateral DP/PT.  Plan: NSTEMI: Cardiac cath is indicated. Likely inferior MI this am.  I have reviewed the risks, indications, and alternatives to cardiac catheterization, possible angioplasty, and stenting with the patient. Risks include but are not limited to bleeding, infection, vascular injury, stroke, myocardial infection, arrhythmia, kidney injury, radiation-related injury in the case of prolonged fluoroscopy use, emergency cardiac surgery, and death. The patient understands the risks of serious complication is 1-2 in 6789  with diagnostic cardiac cath and 1-2% or less with angioplasty/stenting.  -Continue IV heparin -Fluid bolus with soft BP -Cardiac cath today.   Lauree Chandler, MD, [facility] 10/13/2021. 1:49 PM

## 2021-10-13 NOTE — ED Notes (Signed)
Admitting MD made aware of the pt's Hr and BP. No new orders

## 2021-10-13 NOTE — ED Provider Notes (Signed)
[facility] EMERGENCY DEPARTMENT Provider Note   CSN: [id number] Arrival date & time: 10/13/21  0455     History  Chief Complaint  Patient presents with   Chest Pain   Shortness of Breath    Jon [name] is a 76 y.o. male.  The history is provided by the patient and medical records.  Chest Pain Associated symptoms: shortness of breath   Shortness of Breath Associated symptoms: chest pain   Jon [name] is a 76 y.o. male who presents to the Emergency Department complaining of chest pain.  He presents to the emergency department complaining of burning diffuse chest pain that woke him from sleep at 4 AM.  Overall pain is resolving.  He had associated discomfort with breathing.  No fever, cough, abdominal pain, nausea, vomiting, hematochezia, melena.  No prior similar symptoms.  He has a history of diabetes.  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  No known sick contacts.   No tobacco, occasional alcohol, no drugs.  Home Medications Prior to Admission medications   Medication Sig Start Date End Date Taking? Authorizing Provider  aspirin 325 MG EC tablet Take 325 mg by mouth daily. 05/10/12   [provider]  Cholecalciferol 1000 UNITS tablet Take 1,000 Units by mouth daily.    [provider]  EPINEPHrine 0.3 mg/0.3 mL IJ SOAJ injection Inject 0.3 mg into the muscle as needed for anaphylaxis. 08/22/21   White, Leitha Schuller, NP  fenofibrate micronized (LOFIBRA) 134 MG capsule TAKE 1 CAPSULE BY MOUTH DAILY BEFORE AND BREAKFAST 02/08/21   Plotnikov, Evie Lacks, MD  HYDROcodone-acetaminophen (NORCO/VICODIN) 5-325 MG tablet Take 1 tablet every 6 hours by oral route. 08/26/21   [provider]  HYDROcodone-acetaminophen (NORCO/VICODIN) 5-325 MG tablet Take by mouth. 08/27/21   [provider]  loratadine (CLARITIN) 10 MG tablet     [provider]  metFORMIN (GLUCOPHAGE) 1000 MG  tablet TAKE 1 TABLET(1000 MG) BY MOUTH TWICE DAILY WITH A MEAL 08/04/21   Plotnikov, Evie Lacks, MD  Multiple Vitamin (MULTIVITAMIN WITH MINERALS) TABS tablet Take 1 tablet by mouth daily.    [provider]  nateglinide (STARLIX) 120 MG tablet TAKE 1 TABLET(120 MG) BY MOUTH THREE TIMES DAILY WITH MEALS 08/10/20   Plotnikov, Evie Lacks, MD  omeprazole (PRILOSEC) 40 MG capsule TAKE 1 CAPSULE BY MOUTH DAILY 02/08/21   Plotnikov, Evie Lacks, MD      Allergies    Patient has no known allergies.    Review of Systems   Review of Systems  Respiratory:  Positive for shortness of breath.   Cardiovascular:  Positive for chest pain.  All other systems reviewed and are negative.   Physical Exam Updated Vital Signs BP 103/85   Pulse (!) 40   Temp 98 F (36.7 C) (Oral)   Resp 19   Ht '5\' 10"'$  (1.778 m)   Wt 86.2 kg   SpO2 97%   BMI 27.26 kg/m  Physical Exam Vitals and nursing note reviewed.  Constitutional:      Appearance: He is well-developed.  HENT:     Head: Normocephalic and atraumatic.  Cardiovascular:     Rate and Rhythm: Normal rate. Rhythm irregular.     Heart sounds: No murmur heard. Pulmonary:     Effort: Pulmonary effort is normal. No respiratory distress.     Breath sounds: Normal breath sounds.  Abdominal:     Palpations: Abdomen is soft.  Tenderness: There is no abdominal tenderness. There is no guarding or rebound.  Musculoskeletal:        General: No swelling or tenderness.  Skin:    General: Skin is warm and dry.  Neurological:     Mental Status: He is alert and oriented to person, place, and time.  Psychiatric:        Behavior: Behavior normal.     ED Results / Procedures / Treatments   Labs (all labs ordered are listed, but only abnormal results are displayed) Labs Reviewed  COMPREHENSIVE METABOLIC PANEL - Abnormal; Notable for the following components:      Result Value   Glucose, Bld 123 (*)    Creatinine, Ser 1.87 (*)    GFR, Estimated 37 (*)     All other components within normal limits  CBC WITH DIFFERENTIAL/PLATELET  LIPASE, BLOOD  URINALYSIS, ROUTINE W REFLEX MICROSCOPIC  TROPONIN I (HIGH SENSITIVITY)  TROPONIN I (HIGH SENSITIVITY)    EKG EKG Interpretation  Date/Time:  Wednesday October 13 2021 05:07:15 EDT Ventricular Rate:  50 PR Interval:    QRS Duration: 108 QT Interval:  440 QTC Calculation: 402 R Axis:   24 Text Interpretation: Atrial fibrillation Borderline repolarization abnormality Confirmed by Quintella Reichert [id number]) on 10/13/2021 5:28:41 AM  Radiology DG Chest Port 1 View  Result Date: 10/13/2021 CLINICAL DATA:  Chest pain. EXAM: PORTABLE CHEST 1 VIEW COMPARISON:  11/23/2015 FINDINGS: 0522 hours. Low volume film. The lungs are clear without focal pneumonia, edema, pneumothorax or pleural effusion. The cardiopericardial silhouette is within normal limits for size. The visualized bony structures of the thorax are unremarkable. Telemetry leads overlie the chest. IMPRESSION: No active disease. Electronically Signed   By: Misty Stanley M.D.   On: 10/13/2021 06:03    Procedures Procedures    Medications Ordered in ED Medications  aspirin chewable tablet 324 mg (324 mg Oral Given 10/13/21 0525)  sodium chloride 0.9 % bolus 500 mL (500 mLs Intravenous New Bag/Given 10/13/21 2119)    ED Course/ Medical Decision Making/ A&P                           Medical Decision Making Amount and/or Complexity of Data Reviewed Labs: ordered. Radiology: ordered.  Risk OTC drugs.   Patient with history of diabetes here for evaluation of chest pain that woke him from sleep.  Patient is found to be in atrial fibrillation.  Patient denies history of atrial fibrillation but on record review he did have atrial fibrillation on EKGs in the past.  Labs significant for AKI with elevation his creatinine when compared to baseline.  Given his new onset chest pain, A-fib and AKI recommend observation admission.  Patient does have  borderline low blood pressures and he was treated with IV fluid.  Discussed with patient recommendation given multiple concurrent issues.  Patient initially agrees to admission but on repeat evaluation patient refuses admission for ongoing work-up stating that he has things that he needs to do.  Discussed concerns and risks of leaving without further evaluation and treatment patient acknowledges these risks.  He does have close follow-up available.  Discussed that patient is able to return at any time for further evaluation.  Return precautions discussed.  CHA2DS2/VAS Stroke Risk Points  Current as of 12 minutes ago     5 >= 2 Points: High Risk  1 - 1.99 Points: Medium Risk  0 Points: Low Risk    Last Change: N/A  Details    This score determines the patient's risk of having a stroke if the  patient has atrial fibrillation.       Points Metrics  0 Has Congestive Heart Failure:  No    Current as of 12 minutes ago  1 Has Vascular Disease:  Yes    Current as of 12 minutes ago  1 Has Hypertension:  Yes    Current as of 12 minutes ago  2 Age:  20    Current as of 12 minutes ago  1 Has Diabetes:  Yes    Current as of 12 minutes ago  0 Had Stroke:  No  Had TIA:  No  Had Thromboembolism:  No    Current as of 12 minutes ago  0 Male:  No    Current as of 12 minutes ago                    Final Clinical Impression(s) / ED Diagnoses Final diagnoses:  Precordial chest pain  AKI (acute kidney injury) ([place])  Atrial fibrillation, unspecified type [facility])    Rx / DC Orders ED Discharge Orders     None         Quintella Reichert, MD 10/13/21 0745

## 2021-10-13 NOTE — H&P (Signed)
History and Physical    Jon [name] CHE:[id number] DOB: 1945-06-19 DOA: 10/13/2021  PCP: [name], MD (Confirm with patient/family/NH records and if not entered, this has to be entered at [facility] point of entry) Patient coming from: Home  I have personally briefly reviewed patient's old medical records in [place]  Chief Complaint: Chest pain, lightheadedness  HPI: Jon [name] is a 76 y.o. 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  Patient woke up this morning with severe burning back chest pain, centrally located, associated with lightheadedness and blurry vision, 7-8/10, nonradiating, persisted for 1 hour and subsided by itself.  In the ED, patient blood pressure borderline low, and bradycardia heart rate ranging from 30s-50s, and work-up showed patient has a new AKI with creatinine 1.8 compared to baseline 1.3.  EKG showed patient has a flutter with various transduction 5-7:1.  Troponin 11>397.  During the ED stay, patient signed AMA.  While in the ED waiting area, patient started to have another episode of lightheadedness and slumped over in the chair.  He was not for how much time he lost his consciousness.  Denies any fall or head injury.  2 weeks ago, patient went to see his doctor regarding her left elbow cyst with pain, he was diagnosed with " local infection" and completed 2 weeks of antibiotics.  He reported partial improvement with the pain but not much with the swelling, denies any fever chills.  He has had to see his on and off for many years.  In 2018, patient was found to have bradycardia and went to see cardiology, but he was diagnosed with OSA, he said his problem of bradycardia resolved after starting using CPAP.   Review of  Systems: As per HPI otherwise 14 point review of systems negative.    Past Medical History:  Diagnosis Date   Acute intractable headache 09/02/2021   Adenomatous colon polyp    Anemia    Atrial fibrillation ([place])    Cataract    Diabetes mellitus without complication ([place])    Diverticulosis    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[place]) 2012   Gastroesophageal /radiation Tx and Chemo   GERD (gastroesophageal reflux disease)    Glaucoma    H. pylori infection 2011   Heart murmur    History of radiation therapy 09/20/10 thru 10/29/10   gastroesophageal junction/gastric cardia   Hypertension    Neuromuscular disorder ([place])    Other and unspecified hyperlipidemia    Sleep apnea    On CPAP   Status post chemotherapy 09/22/10 thru 10/27/10   concurrent w/radiation   Unspecified sleep apnea     Past Surgical History:  Procedure Laterality Date   CATARACT EXTRACTION     bilateral   COLONOSCOPY     ELBOW BURSA SURGERY     LEG SURGERY  1980   right leg    SKIN BIOPSY  08/22/11   shave biopsy =benign   TONSILLECTOMY       reports that he quit smoking about 16 years ago. His smoking use included cigarettes. He has a 40.00 pack-year smoking history. He has never used smokeless tobacco. He reports current alcohol use. He reports that he does not use drugs.  No Known Allergies  Family History  Problem Relation Age of Onset   Arthritis Mother  Diabetes Father    Diabetes Brother    Coronary artery disease Neg Hx    Colon cancer Neg Hx    Esophageal cancer Neg Hx    Rectal cancer Neg Hx    Stomach cancer Neg Hx      Prior to Admission medications   Medication Sig Start Date End Date Taking? Authorizing Provider  aspirin 325 MG EC tablet Take 325 mg by mouth daily. 05/10/12   [provider]  Cholecalciferol 1000 UNITS tablet Take 1,000 Units by mouth daily.    [provider]  EPINEPHrine 0.3 mg/0.3 mL IJ SOAJ injection Inject 0.3 mg into the muscle as needed for  anaphylaxis. 08/22/21   White, Leitha Schuller, NP  fenofibrate micronized (LOFIBRA) 134 MG capsule TAKE 1 CAPSULE BY MOUTH DAILY BEFORE AND BREAKFAST 02/08/21   Plotnikov, Evie Lacks, MD  HYDROcodone-acetaminophen (NORCO/VICODIN) 5-325 MG tablet Take 1 tablet every 6 hours by oral route. 08/26/21   [provider]  HYDROcodone-acetaminophen (NORCO/VICODIN) 5-325 MG tablet Take by mouth. 08/27/21   [provider]  loratadine (CLARITIN) 10 MG tablet     [provider]  metFORMIN (GLUCOPHAGE) 1000 MG tablet TAKE 1 TABLET(1000 MG) BY MOUTH TWICE DAILY WITH A MEAL 08/04/21   Plotnikov, Evie Lacks, MD  Multiple Vitamin (MULTIVITAMIN WITH MINERALS) TABS tablet Take 1 tablet by mouth daily.    [provider]  nateglinide (STARLIX) 120 MG tablet TAKE 1 TABLET(120 MG) BY MOUTH THREE TIMES DAILY WITH MEALS 08/10/20   Plotnikov, Evie Lacks, MD  omeprazole (PRILOSEC) 40 MG capsule TAKE 1 CAPSULE BY MOUTH DAILY 02/08/21   Plotnikov, Evie Lacks, MD    Physical Exam: Vitals:   10/13/21 0818 10/13/21 0821 10/13/21 0830 10/13/21 0845  BP:  (!) 81/63 (!) 82/66 (!) 80/60  Pulse: (!) 41 (!) 37 (!) 50 (!) 42  Resp: 15 (!) 22 19 (!) 21  Temp:      TempSrc:      SpO2: 98% 97% 99% 93%  Weight:      Height:        Constitutional: NAD, calm, comfortable Vitals:   10/13/21 0818 10/13/21 0821 10/13/21 0830 10/13/21 0845  BP:  (!) 81/63 (!) 82/66 (!) 80/60  Pulse: (!) 41 (!) 37 (!) 50 (!) 42  Resp: 15 (!) 22 19 (!) 21  Temp:      TempSrc:      SpO2: 98% 97% 99% 93%  Weight:      Height:       Eyes: PERRL, lids and conjunctivae normal ENMT: Mucous membranes are moist. Posterior pharynx clear of any exudate or lesions.Normal dentition.  Neck: normal, supple, no masses, no thyromegaly Respiratory: clear to auscultation bilaterally, no wheezing, no crackles. Normal respiratory effort. No accessory muscle use.  Cardiovascular: Irregular heart rate, no murmurs / rubs / gallops. No extremity  edema. 2+ pedal pulses. No carotid bruits.  Abdomen: no tenderness, no masses palpated. No hepatosplenomegaly. Bowel sounds positive.  Musculoskeletal: no clubbing / cyanosis. No joint deformity upper and lower extremities. Good ROM, no contractures. Normal muscle tone.  Skin: no rashes, lesions, ulcers. No induration.  Left elbow large cyst, mild tenderness, movable Neurologic: CN 2-12 grossly intact. Sensation intact, DTR normal. Strength 5/5 in all 4.  Psychiatric: Normal judgment and insight. Alert and oriented x 3. Normal mood.     Labs on Admission: I have personally reviewed following labs and imaging studies  CBC: Recent Labs  Lab 10/13/21 0510 10/13/21 0845  WBC 9.0  --   NEUTROABS 3.6  --   HGB 15.7 16.0  HCT 47.4 47.0  MCV 88.4  --   PLT 362  --    Basic Metabolic Panel: Recent Labs  Lab 10/13/21 0510 10/13/21 0845  NA 139 140  K 4.1 3.9  CL 104 106  CO2 25  --   GLUCOSE 123* 138*  BUN 16 15  CREATININE 1.87* 1.60*  CALCIUM 9.9  --    GFR: Estimated Creatinine Clearance: 40.6 mL/min (A) (by C-G formula based on SCr of 1.6 mg/dL (H)). Liver Function Tests: Recent Labs  Lab 10/13/21 0510  AST 30  ALT 19  ALKPHOS 45  BILITOT 0.7  PROT 6.9  ALBUMIN 4.2   Recent Labs  Lab 10/13/21 0510  LIPASE 32   No results for input(s): "AMMONIA" in the last 168 hours. Coagulation Profile: No results for input(s): "INR", "PROTIME" in the last 168 hours. Cardiac Enzymes: No results for input(s): "CKTOTAL", "CKMB", "CKMBINDEX", "TROPONINI" in the last 168 hours. BNP (last 3 results) No results for input(s): "PROBNP" in the last 8760 hours. HbA1C: No results for input(s): "HGBA1C" in the last 72 hours. CBG: Recent Labs  Lab 10/13/21 0815  GLUCAP 111*   Lipid Profile: No results for input(s): "CHOL", "HDL", "LDLCALC", "TRIG", "CHOLHDL", "LDLDIRECT" in the last 72 hours. Thyroid Function Tests: No results for input(s): "TSH", "T4TOTAL", "FREET4", "T3FREE",  "THYROIDAB" in the last 72 hours. Anemia Panel: No results for input(s): "VITAMINB12", "FOLATE", "FERRITIN", "TIBC", "IRON", "RETICCTPCT" in the last 72 hours. Urine analysis:    Component Value Date/Time   COLORURINE YELLOW 06/10/2019 0955   APPEARANCEUR CLEAR 06/10/2019 0955   LABSPEC 1.020 06/10/2019 0955   PHURINE 6.5 06/10/2019 0955   GLUCOSEU NEGATIVE 06/10/2019 0955   HGBUR NEGATIVE 06/10/2019 0955   BILIRUBINUR NEGATIVE 06/10/2019 0955   KETONESUR NEGATIVE 06/10/2019 0955   UROBILINOGEN 1.0 06/10/2019 0955   NITRITE NEGATIVE 06/10/2019 0955   LEUKOCYTESUR NEGATIVE 06/10/2019 0955    Radiological Exams on Admission: DG Chest Port 1 View  Result Date: 10/13/2021 CLINICAL DATA:  Chest pain. EXAM: PORTABLE CHEST 1 VIEW COMPARISON:  11/23/2015 FINDINGS: 0522 hours. Low volume film. The lungs are clear without focal pneumonia, edema, pneumothorax or pleural effusion. The cardiopericardial silhouette is within normal limits for size. The visualized bony structures of the thorax are unremarkable. Telemetry leads overlie the chest. IMPRESSION: No active disease. Electronically Signed   By: Misty Stanley M.D.   On: 10/13/2021 06:03    EKG: Independently reviewed.  A flutter with heart rates transduction block, no acute ST changes.  Assessment/Plan Principal Problem:   Syncope Active Problems:   CHEST PAIN   New onset a-fib [facility])   Near syncope  (please populate well all problems here in Problem List. (For example, if patient is on BP meds at home and you resume or decide to hold them, it is a problem that needs to be her. Same for CAD, COPD, HLD and so on)  Syncope -Likely secondary to bradycardia, clinically suspect A-fib with sick sinus syndrome -Discussed with cardiology, who will evaluate the patient for PPM.  Briefly discussed with cardiology regarding recent left elbow infection, as patient completed 2 weeks of antibiotics recently, and no significant symptoms signs of active  infection at this point as patient does not have fever, no leukocytosis.  Symptomatic bradycardia -As above. -Trial of albuterol nebulizer x1, as needed atropine -Admitted to PCU for close monitoring, may need bedside pacing -With work-up:  TSH sent, appears that his OSA has been sufficiently treated.  Echocardiogram also ordered  Hypotension -Likely secondary to symptomatic bradycardia, management as above.  Start maintenance IV fluid.  A flutter with various transduction blockage -Patient not on any beta-blocker or calcium channel blocker. -Suspect underlying sick sinus syndrome, management as above. -Has a history of iron deficiency anemia however resolved after stomach/esophageal junction cancer was treated.  Removed CT and colonoscopy showed moderate to diverticulosis but no significant bleeding history from lower GI.  H&H normal limits.  CHADS2= 2, on heparin drip now, consider switch to p.o. anticoagulation once cardiac work-up completed.  Elevated troponins -Had a brief episode of chest pain, but no EKG changes -Will be treated as unstable angina with systemic anticoagulation  AKI -Probably prerenal secondary to hypotension, start IV fluid -Trend kidney function  IIDM -Hold off p.o. diabetic medications, start sliding scale  OSA -CPAP at bedtime  DVT prophylaxis: Heparin drip Code Status: Full code Family Communication: None at bedside Disposition Plan: Expect more than 2 midnight hospital stay with symptoms of high risk syncope, may require PPM. Consults called: Cardiology Admission status: PCU   Lequita Halt MD [facility] Pager [phone]  10/13/2021, 9:28 AM

## 2021-10-13 NOTE — Progress Notes (Signed)
Rt IJ site bleeding with large hematoma and bruising. Pt AOx4, vitals per flow sheet. Direct pressure held to site x 30 min. New pressure wedge dressing placed with thrombi pad to site. Md at bedside to observe site. Will continue to observe for bleeding.

## 2021-10-13 NOTE — ED Notes (Signed)
Got patient into a gown back on the monitor patient is resting with call bell in reach

## 2021-10-13 NOTE — Progress Notes (Signed)
ANTICOAGULATION CONSULT NOTE - Initial Consult  Pharmacy Consult for heparin Indication: atrial fibrillation  No Known Allergies  Patient Measurements: Height: '5\' 10"'$  (177.8 cm) Weight: 86.2 kg (190 lb) IBW/kg (Calculated) : 73 Heparin Dosing Weight: 86.2kg  Vital Signs: Temp: 98.1 F (36.7 C) (09/13 0759) Temp Source: Oral (09/13 0759) BP: 80/60 (09/13 0845) Pulse Rate: 42 (09/13 0845)  Labs: Recent Labs    10/13/21 0510 10/13/21 0714 10/13/21 0845  HGB 15.7  --  16.0  HCT 47.4  --  47.0  PLT 362  --   --   CREATININE 1.87*  --  1.60*  TROPONINIHS 11 397*  --     Estimated Creatinine Clearance: 40.6 mL/min (A) (by C-G formula based on SCr of 1.6 mg/dL (H)).   Medical History: Past Medical History:  Diagnosis Date   Acute intractable headache 09/02/2021   Adenomatous colon polyp    Anemia    Atrial fibrillation ([place])    Cataract    Diabetes mellitus without complication ([place])    Diverticulosis    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[place]) 2012   Gastroesophageal /radiation Tx and Chemo   GERD (gastroesophageal reflux disease)    Glaucoma    H. pylori infection 2011   Heart murmur    History of radiation therapy 09/20/10 thru 10/29/10   gastroesophageal junction/gastric cardia   Hypertension    Neuromuscular disorder ([place])    Other and unspecified hyperlipidemia    Sleep apnea    On CPAP   Status post chemotherapy 09/22/10 thru 10/27/10   concurrent w/radiation   Unspecified sleep apnea     Medications:  Infusions:   sodium chloride     heparin      Assessment: 13 yom presented to the ED with CP and SOB. Found to be in afib and now starting IV heparin. Baseline CBC is WNL and she is not on anticoagulation PTA.   Goal of Therapy:  Heparin level 0.3-0.7 units/ml Monitor platelets by anticoagulation protocol: Yes   Plan:  Heparin bolus 5000 units IV x 1 Heparin gtt 1200 units/hr Check an 8 hr heparin level Daily heparin level and CBC  Jenan Ellegood, Rande Lawman 10/13/2021,8:58 AM

## 2021-10-13 NOTE — ED Notes (Signed)
Dr. Angelena Form verbal order for 250 bolus for pt's BP. Consent signed by pt and witness at bedside

## 2021-10-13 NOTE — ED Provider Notes (Signed)
  Physical Exam  BP (!) 80/60   Pulse (!) 42   Temp 98.1 F (36.7 C) (Oral)   Resp (!) 21   Ht '5\' 10"'$  (1.778 m)   Wt 86.2 kg   SpO2 93%   BMI 27.26 kg/m   Physical Exam  Procedures  Procedures  ED Course / MDM   Clinical Course as of 10/13/21 0928  Wed Oct 13, 2021  0816 Had received signout from previous physician.  76 year old who presented with new A-fib, new onset chest pain with initial reassuring troponin and AKI creatinine 1.87.  He had signed out AMA with the previous physician and then collapsed leaving the ER.  He is neurologically intact and awake alert mentating but initial brought blood pressure is noted for hypotension systolic in the 94W with A-fib bradycardia in the 40s.  Weak palpable radial pulses.  Point-of-care blood glucose 111.  He is denying any active chest pain.  We will start fluids and trial 0.5 mg atropine. [VB]  0839 Rpt trop 397.Hospitalist was present at bedside. He will consult cardiology. [VB]    Clinical Course User Index [VB] Elgie Congo, MD   Medical Decision Making Amount and/or Complexity of Data Reviewed Labs: ordered. Radiology: ordered.  Risk OTC drugs. Prescription drug management. Decision regarding hospitalization.          Elgie Congo, MD 10/13/21 [phone]

## 2021-10-13 NOTE — ED Notes (Signed)
Pt walking down hall toward lobby, noted to be pale, diaphoretic; pt helped to wheelchair, taken back to room; Dr. Nechama Guard notified; CBG 111

## 2021-10-13 NOTE — ED Notes (Signed)
Informed [facility] in [facility] doc box about patient's return to Rm 26 and plan for admittance per Dr. Trula Slade verbal order.

## 2021-10-13 NOTE — ED Triage Notes (Signed)
Patient with chest burning sensation that started at 4am.  Patient states that he is having some shortness of breath with it.  Denies any nausea or vomiting.  States that he had some palpitations years ago, but not since.

## 2021-10-13 NOTE — Consult Note (Addendum)
Cardiology Consultation   Patient ID: Jon [name] MRN: [id number]; DOB: [birth year]  Admit date: 10/13/2021 Date of Consult: 10/13/2021  PCP:  [name], MD   [facility] Prov[facility] Cardiologist: New   Patient Profile:   Jon [name] is a 76 y.o. male with a hx of paroxysmal atrial fibrillation, hypertension, diabetes, obviously sleep apnea on CPAP and prior tobacco smoking who is being seen 10/13/2021 for the evaluation of dizziness and near syncope at the request of Dr. Roosevelt Locks.   Patient with remote history of atrial fibrillation with rapid ventricular rate about 10 to 15 years ago.  He reports this was resolved after placed on CPAP for sleep apnea.  Also carries diagnosis of hypertension and diabetes but denies taking any medication.  40-pack-year tobacco smoking history.  Quit 5 years ago. Brother had an MI at age 81 requiring stenting.  He also has pacemaker/ICD. He drinks alcohol twice per week. Walks 3 miles every morning without any problem.  History of Present Illness:   Jon [name] was in usual state of health up until this morning when woke up from sleep at 4 AM with substernal burning chest discomfort.  Associated with shortness of breath.  Felt weak and fatigued.  Came to ER for further evaluation.  He was found in A-fib with slow ventricular rate.  Initial troponin negative but then trended up to 300.  He signed AMA, while walking in triage he had a severe episode of dizziness with diaphoresis and shortness of breath.  Brought him back in room.  Troponin trended up at 1100.  Cardiology is consulted for evaluation.  Currently chest pain-free.  He is hypotensive.   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 pylori infection 2011   Heart murmur    History of radiation therapy 09/20/10 thru 10/29/10   gastroesophageal junction/gastric cardia   Hypertension    Neuromuscular disorder ([place])    Other and unspecified hyperlipidemia    Sleep apnea    On CPAP   Status post chemotherapy 09/22/10 thru 10/27/10   concurrent w/radiation   Unspecified sleep apnea     Past Surgical History:  Procedure Laterality Date   CATARACT EXTRACTION     bilateral   COLONOSCOPY     ELBOW BURSA SURGERY     LEG SURGERY  1980   right leg    SKIN BIOPSY  08/22/11   shave biopsy =benign   TONSILLECTOMY       Inpatient Medications: Scheduled Meds:  [START ON 10/14/2021] aspirin  81 mg Oral Pre-Cath   aspirin EC  325 mg Oral Daily   atropine  0.5 mg Intravenous Once   fenofibrate micronized  134 mg Oral Q breakfast   insulin aspart  0-15 Units Subcutaneous TID WC   pantoprazole  40 mg Oral Daily   sodium chloride flush  3 mL Intravenous Q12H   sodium chloride flush  3 mL Intravenous Q12H   Continuous Infusions:  sodium chloride 125 mL/hr at 10/13/21 0915   sodium chloride     Followed by  sodium chloride     heparin 1,200 Units/hr (10/13/21 0905)   PRN Meds: HYDROcodone-acetaminophen, loratadine  Allergies:   No Known Allergies  Social History:   Social History   Socioeconomic History   Marital status: Widowed    Spouse name: Not on file   Number of children: 2   Years of education: Not on file   Highest education level: Not on file  Occupational History   Occupation: retired    Fish farm manager: RETIRED  Tobacco Use   Smoking status: Former    Packs/day: 1.00    Years: 40.00    Total pack years: 40.00    Types: Cigarettes    Quit date: 01/31/2005    Years since quitting: 16.7   Smokeless tobacco: Never  Substance and Sexual Activity    Alcohol use: Yes    Alcohol/week: 0.0 standard drinks of alcohol    Comment: 2 drinks last 6 months per pt   Drug use: No   Sexual activity: Yes  Other Topics Concern   Not on file  Social History Narrative   Not on file   Social Determinants of Health   Financial Resource Strain: Low Risk  (08/31/2021)   Overall Financial Resource Strain (CARDIA)    Difficulty of Paying Living Expenses: Not hard at all  Food Insecurity: No Food Insecurity (08/31/2021)   Hunger Vital Sign    Worried About Running Out of Food in the Last Year: Never true    Ran Out of Food in the Last Year: Never true  Transportation Needs: No Transportation Needs (08/31/2021)   PRAPARE - Hydrologist (Medical): No    Lack of Transportation (Non-Medical): No  Physical Activity: Sufficiently Active (08/31/2021)   Exercise Vital Sign    Days of Exercise per Week: 3 days    Minutes of Exercise per Session: 60 min  Stress: No Stress Concern Present (08/31/2021)   [place]    Feeling of Stress : Not at all  Social Connections: Moderately Integrated (08/31/2021)   Social Connection and Isolation Panel [NHANES]    Frequency of Communication with Friends and Family: More than three times a week    Frequency of Social Gatherings with Friends and Family: Once a week    Attends Religious Services: 1 to 4 times per year    Active Member of [employer] or Organizations: Yes    Attends Archivist Meetings: 1 to 4 times per year    Marital Status: Widowed  Intimate Partner Violence: Not At Risk (08/31/2021)   Humiliation, Afraid, Rape, and Kick questionnaire    Fear of Current or Ex-Partner: No    Emotionally Abused: No    Physically Abused: No    Sexually Abused: No    Family History:   Family History  Problem Relation Age of Onset   Arthritis Mother    Diabetes Father    Diabetes Brother    Coronary artery disease Neg Hx     Colon cancer Neg Hx    Esophageal cancer Neg Hx    Rectal cancer Neg Hx    Stomach cancer Neg Hx      ROS:  Please see the history of present illness.  All other ROS reviewed and negative.     Physical Exam/Data:   Vitals:   10/13/21 1215 10/13/21 1230 10/13/21 1245 10/13/21 1300  BP: (!) 85/65 (!) 114/98 (!) 86/63 (!) 89/70  Pulse: (!) 51 62 (!)  58 (!) 57  Resp: 20 (!) '26 18 16  '$ Temp:      TempSrc:      SpO2: 92% 98% 98% 97%  Weight:      Height:       No intake or output data in the 24 hours ending 10/13/21 1333    10/13/2021    5:03 AM 09/10/2021    8:58 AM 09/02/2021    8:38 AM  Last 3 Weights  Weight (lbs) 190 lb 198 lb 194 lb  Weight (kg) 86.183 kg 89.812 kg 87.998 kg     Body mass index is 27.26 kg/m.  General:  Well nourished, well developed, in no acute distress HEENT: normal Neck: no JVD Vascular: No carotid bruits; Distal pulses 2+ bilaterally Cardiac:  normal S1, S2; RRR;+ murmur  Lungs:  clear to auscultation bilaterally, no wheezing, rhonchi or rales  Abd: soft, nontender, no hepatomegaly  Ext: no edema Musculoskeletal:  No deformities, BUE and BLE strength normal and equal Skin: warm and dry  Neuro:  CNs 2-12 intact, no focal abnormalities noted Psych:  Normal affect   EKG:  The EKG was personally reviewed and demonstrates: Atrial fibrillation, minimal ST elevation in inferior lead, ST depression in lead V1 to V3 Telemetry:  Telemetry was personally reviewed and demonstrates: Sinus rhythm, atrial fibrillation  Relevant CV Studies: Echocardiogram pending  Laboratory Data:  High Sensitivity Troponin:   Recent Labs  Lab 10/13/21 0510 10/13/21 0714 10/13/21 0836 10/13/21 1150  TROPONINIHS 11 397* 1,101* 9,868*     Chemistry Recent Labs  Lab 10/13/21 0510 10/13/21 0845  NA 139 140  K 4.1 3.9  CL 104 106  CO2 25  --   GLUCOSE 123* 138*  BUN 16 15  CREATININE 1.87* 1.60*  CALCIUM 9.9  --   GFRNONAA 37*  --   ANIONGAP 10  --      Recent Labs  Lab 10/13/21 0510  PROT 6.9  ALBUMIN 4.2  AST 30  ALT 19  ALKPHOS 45  BILITOT 0.7   Lipids No results for input(s): "CHOL", "TRIG", "HDL", "LABVLDL", "LDLCALC", "CHOLHDL" in the last 168 hours.  Hematology Recent Labs  Lab 10/13/21 0510 10/13/21 0836 10/13/21 0845  WBC 9.0  --   --   RBC 5.36  --   --   HGB 15.7 15.3 16.0  HCT 47.4 45.6 47.0  MCV 88.4  --   --   MCH 29.3  --   --   MCHC 33.1  --   --   RDW 14.9  --   --   PLT 362  --   --    Thyroid  Recent Labs  Lab 10/13/21 0714  TSH 0.466    BNPNo results for input(s): "BNP", "PROBNP" in the last 168 hours.  DDimer No results for input(s): "DDIMER" in the last 168 hours.   Radiology/Studies:  DG Chest Port 1 View  Result Date: 10/13/2021 CLINICAL DATA:  Chest pain. EXAM: PORTABLE CHEST 1 VIEW COMPARISON:  11/23/2015 FINDINGS: 0522 hours. Low volume film. The lungs are clear without focal pneumonia, edema, pneumothorax or pleural effusion. The cardiopericardial silhouette is within normal limits for size. The visualized bony structures of the thorax are unremarkable. Telemetry leads overlie the chest. IMPRESSION: No active disease. Electronically Signed   By: Misty Stanley M.D.   On: 10/13/2021 06:03     Assessment and Plan:   Non-STEMI Classic anginal symptoms that woke him up from sleep. Repeat EKG at 8:30 am  showed minimally ST elevation in inferior leads with reciprocal ST depression in leads V1-V3.  Repeat EKG few minutes ago showing patient in sinus rhythm with persistent changes.  Does not meet STEMI criteria but concern for inferior/posterior MI. - Hs-troponin new trended up to 9868 - Continue heparin - ASA '324mg'$  given this morning - No BB given bradycardia - Start statin   -05/12/2021: Cholesterol 125; HDL 43.40; LDL Cholesterol 56; Triglycerides 128.0; VLDL 25.6  - Check lipid panel   Shared Decision Making/Informed Consent The risks [stroke (1 in 1000), death (1 in 1000), kidney  failure [usually temporary] (1 in 500), bleeding (1 in 200), allergic reaction [possibly serious] (1 in 200)], benefits (diagnostic support and management of coronary artery disease) and alternatives of a cardiac catheterization were discussed in detail with Jon [name] and he is willing to proceed.   2.  Paroxysmal atrial fibrillation -Patient with prior history of A-fib RVR but never placed on anticoagulation -Now presenting with symptomatic atrial fibrillation with slow ventricular rate -I think his dizzy spells due to slow rate.  Slow rate could be due to inferior MI. -Avoid AV nodal blocking agent -Continue IV heparin -He will need anticoagulation at discharge -TSH normal -Pending echocardiogram  3.  Diabetes mellitus -Hemoglobin A1c was 7.1 about a month ago -Per primary team  4.  Hypertension -Currently hypotensive  5. AKi - Scr 1.87>>1.6 - follow up closely    Risk Assessment/Risk Scores:     TIMI Risk Score for Unstable Angina or Non-ST Elevation MI:   The patient's TIMI risk score is 5, which indicates a 26% risk of all cause mortality, new or recurrent myocardial infarction or need for urgent revascularization in the next 14 days.{   CHA2DS2-VASc Score = 4   This indicates a 4.8% annual risk of stroke. The patient's score is based upon: CHF History: 0 HTN History: 1 Diabetes History: 1 Stroke History: 0 Vascular Disease History: 0 Age Score: 2 Gender Score: 0     For questions or updates, please contact [place] Please consult [email or web address] for contact info under    Jarrett Soho, PA  10/13/2021 1:33 PM   I have personally seen and examined this patient. I agree with the assessment and plan as outlined above. 76 yo male with HTN, DM, prior tobacco abuse and sleep apnea who presented to the ED with c/o chest pain. He had dizziness and near syncope. EKG this am with inferior ST elevation, now resolved on most recent EKG. Labs  reviewed. Troponin up to 9800.  EKG reviewed My exam:  General: Well developed, well nourished, NAD  HEENT: OP clear, mucus membranes moist  SKIN: warm, dry. No rashes. Neuro: No focal deficits  Musculoskeletal: Muscle strength 5/5 all ext  Psychiatric: Mood and affect normal  Neck: No JVD, no carotid bruits, no thyromegaly, no lymphadenopathy.  Lungs:Clear bilaterally, no wheezes, rhonci, crackles Cardiovascular: Regular rate and rhythm. No murmurs, gallops or rubs. Abdomen:Soft. Bowel sounds present. Non-tender.  Extremities: No lower extremity edema. Pulses are 2 + in the bilateral DP/PT.  Plan: NSTEMI: Cardiac cath is indicated. Likely inferior MI this am.  I have reviewed the risks, indications, and alternatives to cardiac catheterization, possible angioplasty, and stenting with the patient. Risks include but are not limited to bleeding, infection, vascular injury, stroke, myocardial infection, arrhythmia, kidney injury, radiation-related injury in the case of prolonged fluoroscopy use, emergency cardiac surgery, and death. The patient understands the risks of serious complication is 1-2 in 3151  with diagnostic cardiac cath and 1-2% or less with angioplasty/stenting.  -Continue IV heparin -Fluid bolus with soft BP -Cardiac cath today.   Lauree Chandler, MD, [facility] 10/13/2021. 1:49 PM

## 2021-10-14 ENCOUNTER — Encounter (HOSPITAL_COMMUNITY): Payer: Self-pay | Admitting: Cardiovascular Disease

## 2021-10-14 ENCOUNTER — Other Ambulatory Visit (HOSPITAL_COMMUNITY): Payer: Self-pay

## 2021-10-14 ENCOUNTER — Telehealth (HOSPITAL_COMMUNITY): Payer: Self-pay | Admitting: Pharmacy Technician

## 2021-10-14 DIAGNOSIS — R9431 Abnormal electrocardiogram [ECG] [EKG]: Secondary | ICD-10-CM

## 2021-10-14 DIAGNOSIS — E1159 Type 2 diabetes mellitus with other circulatory complications: Secondary | ICD-10-CM | POA: Diagnosis not present

## 2021-10-14 DIAGNOSIS — R001 Bradycardia, unspecified: Secondary | ICD-10-CM

## 2021-10-14 DIAGNOSIS — I480 Paroxysmal atrial fibrillation: Secondary | ICD-10-CM | POA: Diagnosis not present

## 2021-10-14 DIAGNOSIS — I2583 Coronary atherosclerosis due to lipid rich plaque: Secondary | ICD-10-CM

## 2021-10-14 DIAGNOSIS — R55 Syncope and collapse: Secondary | ICD-10-CM

## 2021-10-14 DIAGNOSIS — I200 Unstable angina: Secondary | ICD-10-CM

## 2021-10-14 DIAGNOSIS — C169 Malignant neoplasm of stomach, unspecified: Secondary | ICD-10-CM

## 2021-10-14 DIAGNOSIS — I2510 Atherosclerotic heart disease of native coronary artery without angina pectoris: Secondary | ICD-10-CM

## 2021-10-14 DIAGNOSIS — I2111 ST elevation (STEMI) myocardial infarction involving right coronary artery: Secondary | ICD-10-CM | POA: Diagnosis not present

## 2021-10-14 LAB — GLUCOSE, CAPILLARY
Glucose-Capillary: 89 mg/dL (ref 70–99)
Glucose-Capillary: 92 mg/dL (ref 70–99)
Glucose-Capillary: 96 mg/dL (ref 70–99)

## 2021-10-14 LAB — LIPID PANEL
Cholesterol: 135 mg/dL (ref 0–200)
HDL: 42 mg/dL (ref >40)
LDL Cholesterol: 60 mg/dL (ref 0–99)
Total CHOL/HDL Ratio: 3.2 RATIO
Triglycerides: 164 mg/dL — ABNORMAL HIGH (ref <150)
VLDL: 33 mg/dL (ref 0–40)

## 2021-10-14 LAB — CBC
HCT: 43.9 % (ref 39.0–52.0)
Hemoglobin: 14.9 g/dL (ref 13.0–17.0)
MCH: 29.7 pg (ref 26.0–34.0)
MCHC: 33.9 g/dL (ref 30.0–36.0)
MCV: 87.6 fL (ref 80.0–100.0)
Platelets: 351 10*3/uL (ref 150–400)
RBC: 5.01 MIL/uL (ref 4.22–5.81)
RDW: 15.3 % (ref 11.5–15.5)
WBC: 11.5 10*3/uL — ABNORMAL HIGH (ref 4.0–10.5)
nRBC: 0 % (ref 0.0–0.2)

## 2021-10-14 LAB — BASIC METABOLIC PANEL
Anion gap: 10 (ref 5–15)
BUN: 11 mg/dL (ref 8–23)
CO2: 24 mmol/L (ref 22–32)
Calcium: 9.3 mg/dL (ref 8.9–10.3)
Chloride: 106 mmol/L (ref 98–111)
Creatinine, Ser: 1.33 mg/dL — ABNORMAL HIGH (ref 0.61–1.24)
GFR, Estimated: 55 mL/min — ABNORMAL LOW (ref >60)
Glucose, Bld: 130 mg/dL — ABNORMAL HIGH (ref 70–99)
Potassium: 3.6 mmol/L (ref 3.5–5.1)
Sodium: 140 mmol/L (ref 135–145)

## 2021-10-14 MED ORDER — SODIUM CHLORIDE 0.9 % IV SOLN: 250.0000 mL | INTRAVENOUS | Status: DC | PRN

## 2021-10-14 MED ORDER — SODIUM CHLORIDE 0.9% FLUSH: 3.0000 mL | INTRAVENOUS | Status: DC | PRN

## 2021-10-14 MED ORDER — SODIUM CHLORIDE 0.9% FLUSH
3.0000 mL | Freq: Two times a day (BID) | INTRAVENOUS | Status: DC
  Administered 2021-10-14 – 2021-10-15 (×2): 3 mL via INTRAVENOUS

## 2021-10-14 MED ORDER — ORAL CARE MOUTH RINSE: 15.0000 mL | OROMUCOSAL | Status: DC | PRN

## 2021-10-14 MED ORDER — SODIUM CHLORIDE 0.9 % WEIGHT BASED INFUSION
3.0000 mL/kg/h | INTRAVENOUS | Status: AC
  Administered 2021-10-15: 3 mL/kg/h via INTRAVENOUS

## 2021-10-14 MED ORDER — ENOXAPARIN SODIUM 40 MG/0.4ML IJ SOSY
40.0000 mg | PREFILLED_SYRINGE | Freq: Every day | INTRAMUSCULAR | Status: DC
  Administered 2021-10-14: 40 mg via SUBCUTANEOUS
  Filled 2021-10-14: qty 0.4

## 2021-10-14 MED ORDER — CHLORHEXIDINE GLUCONATE CLOTH 2 % EX PADS
6.0000 | MEDICATED_PAD | Freq: Every day | CUTANEOUS | Status: DC
  Administered 2021-10-14 – 2021-10-19 (×6): 6 via TOPICAL

## 2021-10-14 MED ORDER — ASPIRIN 81 MG PO CHEW
81.0000 mg | CHEWABLE_TABLET | ORAL | Status: AC
  Administered 2021-10-15: 81 mg via ORAL
  Filled 2021-10-14: qty 1

## 2021-10-14 MED ORDER — SODIUM CHLORIDE 0.9 % WEIGHT BASED INFUSION
1.0000 mL/kg/h | INTRAVENOUS | Status: DC
  Administered 2021-10-15: 1 mL/kg/h via INTRAVENOUS

## 2021-10-14 MED FILL — Nitroglycerin IV Soln 100 MCG/ML in D5W: INTRA_ARTERIAL | Qty: 10 | Status: AC

## 2021-10-14 NOTE — TOC Benefit Eligibility Note (Signed)
Patient Teacher, English as a foreign language completed.    The patient is currently admitted and upon discharge could be taking Eliquis 5 mg.  The current 30 day co-pay is $37.00.   The patient is currently admitted and upon discharge could be taking Brilinta 90 mg.  The current 30 day co-pay is $37.00.   The patient is currently admitted and upon discharge could be taking Farxiga 10 mg.  The current 30 day co-pay is $37.00.   The patient is currently admitted and upon discharge could be taking Jardiance 10 mg.  The current 30 day co-pay is $37.00.   The patient is insured through  [place], [place] Patient Advocate Specialist [place] Patient Advocate Team Direct Number: [phone]  Fax: [phone]

## 2021-10-14 NOTE — Progress Notes (Signed)
PROGRESS NOTE  Jon [name] LNL:[id number] DOB: 02/21/1945   PCP: [name], MD  Patient is from: Home.  Lives alone.  Independently ambulates at baseline.  DOA: 10/13/2021 LOS: 1  Chief complaints Chief Complaint  Patient presents with   Chest Pain   Shortness of Breath     Brief Narrative / Interim history: 76 year old M with PMH of DM-2, OSA on CPAP, paroxysmal A-fib, GEJ adenocarcinoma s/p XRT in 2012 and diverticulosis presented with new onset chest pain and near syncope associated with lightheadedness and blurry vision, and found to have inferior STEMI, bradycardia, hypotension and AKI.  Initial EKG showed atrial flutter with various transduction. Troponin trended from 11-15,000.  Subsequent EKG concerning for inferior ST elevation.  Cardiology consulted. LHC with occluded posterolateral branch felt to be the culprit lesion but flow could not be restored with balloon angioplasty.  LHC also showed severe serial mid LAD lesions.    Cardiology planning a staged PCI of the calcified mid LAD lesion on 9/15.  Patient is on DAPT with aspirin and Brilinta.  TTE with LVEF of 60 to 65%, RWMA and G2-DD  Subjective: Seen and examined earlier this morning.  No major events overnight of this morning.  He had bleeding from cath site that was managed with pressure dressing.  No complaint this morning.  He denies chest pain or discomfort.  He denies dyspnea, palpitation, dizziness, GI or UTI symptoms.  Objective: Vitals:   10/14/21 1100 10/14/21 1130 10/14/21 1156 10/14/21 1200  BP: (!) 89/67 92/72  (!) 84/58  Pulse: (!) 51 63  63  Resp: (!) 22 19  (!) 22  Temp:   98 F (36.7 C)   TempSrc:   Oral   SpO2: 100% 99%  95%  Weight:      Height:        Examination:  GENERAL: No apparent distress.  Nontoxic. HEENT: MMM.  Vision and hearing grossly intact.  NECK: Supple.  No apparent JVD.  Pressure dressing over right neck RESP:  No IWOB.  Fair aeration bilaterally. CVS:  Sinus bradycardia to 50s.  Heart sounds normal.  ABD/GI/GU: BS+. Abd soft, NTND.  MSK/EXT:  Moves extremities. No apparent deformity. No edema.  SKIN: no apparent skin lesion or wound NEURO: Awake, alert and oriented appropriately.  No apparent focal neuro deficit. PSYCH: Calm. Normal affect.   Procedures:  9/13-LHC with occluded posterolateral branch felt to be the culprit lesion but flow could not be restored with balloon angioplasty.  LHC also showed severe serial mid LAD lesions.     Microbiology summarized: MRSA PCR screen nonreactive.  Assessment and plan: Principal Problem:   Inferior ST segment elevation Active Problems:   CHEST PAIN   PAF (paroxysmal atrial fibrillation) (HCC)   Primary adenocarcinoma of GE junction   Diabetes mellitus, type 2 (HCC)   Coronary artery disease   New onset a-fib ([place])   Near syncope   Syncope   Non-ST elevation (NSTEMI) myocardial infarction (HCC)  Inferior STEMI/CAD: Troponin trended from 11-15,000.  EKG raises concern for inferior STEMI.  LHC with occluded posterolateral branch felt to be the culprit lesion but flow could not be restored with balloon angioplasty.  LHC also showed severe serial mid LAD lesions.  TTE with LVEF of 60 to 65%, RWMA and G2 DD.  No further chest pain or cardiopulmonary symptoms.  Still with bradycardia to 50s. -Cardiology managing -Continue DAPT (ASA and Brilinta), statin -Staged PCI of the calcified mid LAD lesion tomorrow with  orbital atherectomy.  -Will continue DAPT with ASA and Brilinta.  -Hypotension and bradycardia precludes addition of BB or ARB/ACEI -N.p.o. after midnight   Sinus symptomatic bradycardia/syncope: In the setting of the above.  Not on nodal blocking agent. -Continue telemetry monitoring -Optimize electrolytes -Defer to cardiology  Hypotension: In the setting of the above.  Now normotensive but soft. -Monitor  Paroxysmal A-fib/flutter: CHA2DS2-VASc score > 3.  Currently in sinus  bradycardia. -Defer anticoagulation to cardiology    AKI: Likely due to hypotension.  Improving. Recent Labs    11/10/20 1018 05/12/21 0902 08/30/21 1556 09/02/21 0954 09/02/21 1527 09/10/21 0914 10/13/21 0510 10/13/21 0845 10/13/21 1327 10/14/21 0623  BUN '21 18 16 18 22 23 16 15 13 11  '$ CREATININE 1.25 1.25 1.10 1.16 1.37* 1.35 1.87* 1.60* 1.49* 1.33*  -Continue monitoring -Avoid nephrotoxic meds -IVF   Uncontrolled NIDDM-2: A1c 7.1% on 09/02/2021. Recent Labs  Lab 10/13/21 0815 10/13/21 1226 10/13/21 2122 10/14/21 0648 10/14/21 1154  GLUCAP 111* 118* 110* 89 92  -Continue SSI -Continue high intensity statin   OSA on CPAP -CPAP at bedtime  Body mass index is 28.15 kg/m.           DVT prophylaxis:  enoxaparin (LOVENOX) injection 40 mg Start: 10/14/21 1400  Code Status: Full code Family Communication: None at bedside Level of care: ICU Status is: Inpatient Remains inpatient appropriate because: Inferior STEMI, symptomatic sinus bradycardia with hypotension   Final disposition: Likely home once medically cleared Consultants:  Cardiology  Sch Meds:  Scheduled Meds:  aspirin  81 mg Oral Daily   [START ON 10/15/2021] aspirin  81 mg Oral Pre-Cath   atorvastatin  80 mg Oral Daily   atropine  0.5 mg Intravenous Once   Chlorhexidine Gluconate Cloth  6 each Topical Daily   enoxaparin (LOVENOX) injection  40 mg Subcutaneous Q24H   fenofibrate  160 mg Oral Daily   insulin aspart  0-15 Units Subcutaneous TID WC   pantoprazole  40 mg Oral Daily   sodium chloride flush  3 mL Intravenous Q12H   sodium chloride flush  3 mL Intravenous Q12H   ticagrelor  90 mg Oral BID   Continuous Infusions:  sodium chloride     sodium chloride     sodium chloride     [START ON 10/15/2021] sodium chloride     Followed by   Derrill Memo ON 10/15/2021] sodium chloride     PRN Meds:.sodium chloride, sodium chloride, sodium chloride, acetaminophen, HYDROcodone-acetaminophen,  loratadine, ondansetron (ZOFRAN) IV, mouth rinse, sodium chloride flush, sodium chloride flush, sodium chloride flush  Antimicrobials: Anti-infectives (From admission, onward)    None        I have personally reviewed the following labs and images: CBC: Recent Labs  Lab 10/13/21 0510 10/13/21 0836 10/13/21 0845 10/13/21 1547 10/13/21 1614 10/13/21 1628 10/14/21 0623  WBC 9.0  --   --   --   --   --  11.5*  NEUTROABS 3.6  --   --   --   --   --   --   HGB 15.7   < > 16.0 13.6 13.9 14.3 14.9  HCT 47.4   < > 47.0 40.0 41.0 42.0 43.9  MCV 88.4  --   --   --   --   --  87.6  PLT 362  --   --   --   --   --  351   < > = values in this interval not displayed.  BMP &GFR Recent Labs  Lab 10/13/21 0510 10/13/21 0845 10/13/21 1327 10/13/21 1547 10/13/21 1614 10/13/21 1628 10/14/21 0623  NA 139 140 137 139 140 139 140  K 4.1 3.9 4.6 4.5 4.8 5.0 3.6  CL 104 106 108  --   --   --  106  CO2 25  --  22  --   --   --  24  GLUCOSE 123* 138* 123*  --   --   --  130*  BUN '16 15 13  '$ --   --   --  11  CREATININE 1.87* 1.60* 1.49*  --   --   --  1.33*  CALCIUM 9.9  --  8.6*  --   --   --  9.3   Estimated Creatinine Clearance: 53.1 mL/min (A) (by C-G formula based on SCr of 1.33 mg/dL (H)). Liver & Pancreas: Recent Labs  Lab 10/13/21 0510  AST 30  ALT 19  ALKPHOS 45  BILITOT 0.7  PROT 6.9  ALBUMIN 4.2   Recent Labs  Lab 10/13/21 0510  LIPASE 32   No results for input(s): "AMMONIA" in the last 168 hours. Diabetic: No results for input(s): "HGBA1C" in the last 72 hours. Recent Labs  Lab 10/13/21 0815 10/13/21 1226 10/13/21 2122 10/14/21 0648 10/14/21 1154  GLUCAP 111* 118* 110* 89 92   Cardiac Enzymes: No results for input(s): "CKTOTAL", "CKMB", "CKMBINDEX", "TROPONINI" in the last 168 hours. No results for input(s): "PROBNP" in the last 8760 hours. Coagulation Profile: No results for input(s): "INR", "PROTIME" in the last 168 hours. Thyroid Function  Tests: Recent Labs    10/13/21 0714  TSH 0.466   Lipid Profile: Recent Labs    10/14/21 0623  CHOL 135  HDL 42  LDLCALC 60  TRIG 164*  CHOLHDL 3.2   Anemia Panel: No results for input(s): "VITAMINB12", "FOLATE", "FERRITIN", "TIBC", "IRON", "RETICCTPCT" in the last 72 hours. Urine analysis:    Component Value Date/Time   COLORURINE YELLOW 10/13/2021 0630   APPEARANCEUR CLEAR 10/13/2021 0630   LABSPEC 1.010 10/13/2021 0630   PHURINE 7.0 10/13/2021 0630   GLUCOSEU NEGATIVE 10/13/2021 0630   GLUCOSEU NEGATIVE 06/10/2019 0955   HGBUR NEGATIVE 10/13/2021 0630   BILIRUBINUR NEGATIVE 10/13/2021 0630   KETONESUR NEGATIVE 10/13/2021 0630   PROTEINUR NEGATIVE 10/13/2021 0630   UROBILINOGEN 1.0 06/10/2019 0955   NITRITE NEGATIVE 10/13/2021 0630   LEUKOCYTESUR NEGATIVE 10/13/2021 0630   Sepsis Labs: Invalid input(s): "PROCALCITONIN", "LACTICIDVEN"  Microbiology: Recent Results (from the past 240 hour(s))  MRSA Next Gen by PCR, Nasal     Status: None   Collection Time: 10/13/21  5:03 PM   Specimen: Nasal Mucosa; Nasal Swab  Result Value Ref Range Status   MRSA by PCR Next Gen NOT DETECTED NOT DETECTED Final    Comment: (NOTE) The GeneXpert MRSA Assay (FDA approved for NASAL specimens only), is one component of a comprehensive MRSA colonization surveillance program. It is not intended to diagnose MRSA infection nor to guide or monitor treatment for MRSA infections. Test performance is not FDA approved in patients less than 76 years old. Performed at [facility], [place] [address], [place],  [address]     Radiology Studies: CARDIAC CATHETERIZATION  Result Date: 10/13/2021 1.  Total occlusion of the right posterolateral branch with associated thrombus, unsuccessful PTCA due to inability to establish antegrade flow 2.  Marked calcification, ectasia, and tortuosity of the native RCA with mild to moderate nonobstructive plaquing throughout the  vessel 3.  Patent left  main 4.  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  Patent left circumflex without significant stenosis 6.  Successful placement of right internal jugular central venous line with CVP 11 mmHg and mixed venous oxygen saturation 76% on norepinephrine 5 mics Recommendations: Dual antiplatelet therapy with aspirin and ticagrelor at least 12 months without interruption.  Consider staged atherectomy and PCI of the LAD prior to discharge.  Findings and plan discussed with patient's son.      Tierney Behl T. [place]  If 7PM-7AM, please contact night-coverage [email or web address] 10/14/2021, 1:12 PM

## 2021-10-14 NOTE — Telephone Encounter (Signed)
Pharmacy Patient Advocate Encounter  Insurance verification completed.    The patient is insured through [facility] [employer] [facility] [employer] Part D   The patient is currently admitted and ran test claims for the following: Eliquis, Brilinta, Jardiance, Farxiga.  Copays and coinsurance results were relayed to Inpatient clinical team.

## 2021-10-14 NOTE — Progress Notes (Signed)
Rounding Note    Patient Name: Jon [name] Date of Encounter: 10/14/2021  [place] Cardiologist: Harrington Challenger  Subjective   No chest pain. Neck sore at site of IJ line  Inpatient Medications    Scheduled Meds:  aspirin  81 mg Oral Daily   atorvastatin  80 mg Oral Daily   atropine  0.5 mg Intravenous Once   Chlorhexidine Gluconate Cloth  6 each Topical Daily   fenofibrate  160 mg Oral Daily   insulin aspart  0-15 Units Subcutaneous TID WC   pantoprazole  40 mg Oral Daily   sodium chloride flush  3 mL Intravenous Q12H   ticagrelor  90 mg Oral BID   Continuous Infusions:  sodium chloride 125 mL/hr at 10/14/21 0600   sodium chloride     PRN Meds: sodium chloride, acetaminophen, HYDROcodone-acetaminophen, loratadine, ondansetron (ZOFRAN) IV, sodium chloride flush   Vital Signs    Vitals:   10/14/21 0334 10/14/21 0400 10/14/21 0500 10/14/21 0600  BP:  102/75 112/68 (!) 86/64  Pulse:  (!) 51 (!) 51 (!) 52  Resp:  '13 19 16  '$ Temp: 97.9 F (36.6 C)     TempSrc: Axillary     SpO2:  90% 98% 97%  Weight:   89 kg   Height:        Intake/Output Summary (Last 24 hours) at 10/14/2021 0658 Last data filed at 10/14/2021 0600 Gross per 24 hour  Intake 1763.28 ml  Output 2000 ml  Net -236.72 ml      10/14/2021    5:00 AM 10/13/2021    5:03 AM 09/10/2021    8:58 AM  Last 3 Weights  Weight (lbs) 196 lb 3.4 oz 190 lb 198 lb  Weight (kg) 89 kg 86.183 kg 89.812 kg      Telemetry    Sinus brady - Personally Reviewed  ECG    Sinus brady, subtle ST abn inf leads - Personally Reviewed  Physical Exam   GEN: No acute distress.   Neck: No JVD. Bruising over IJ site. Soft Cardiac: RRR, no murmurs, rubs, or gallops.  Respiratory: Clear to auscultation bilaterally. GI: Soft, nontender, non-distended  MS: No edema; No deformity. Neuro:  Nonfocal  Psych: Normal affect   Labs    High Sensitivity Troponin:   Recent Labs  Lab 10/13/21 0714 10/13/21 0836  10/13/21 1150 10/13/21 1327 10/13/21 1854  TROPONINIHS 397* 1,101* 9,868* 9,340* 15,166*     Chemistry Recent Labs  Lab 10/13/21 0510 10/13/21 0845 10/13/21 1327 10/13/21 1547 10/13/21 1614 10/13/21 1628  NA 139 140 137 139 140 139  K 4.1 3.9 4.6 4.5 4.8 5.0  CL 104 106 108  --   --   --   CO2 25  --  22  --   --   --   GLUCOSE 123* 138* 123*  --   --   --   BUN '16 15 13  '$ --   --   --   CREATININE 1.87* 1.60* 1.49*  --   --   --   CALCIUM 9.9  --  8.6*  --   --   --   PROT 6.9  --   --   --   --   --   ALBUMIN 4.2  --   --   --   --   --   AST 30  --   --   --   --   --   ALT 19  --   --   --   --   --  ALKPHOS 45  --   --   --   --   --   BILITOT 0.7  --   --   --   --   --   GFRNONAA 37*  --  48*  --   --   --   ANIONGAP 10  --  7  --   --   --     Lipids No results for input(s): "CHOL", "TRIG", "HDL", "LABVLDL", "LDLCALC", "CHOLHDL" in the last 168 hours.  Hematology Recent Labs  Lab 10/13/21 0510 10/13/21 0836 10/13/21 1547 10/13/21 1614 10/13/21 1628  WBC 9.0  --   --   --   --   RBC 5.36  --   --   --   --   HGB 15.7   < > 13.6 13.9 14.3  HCT 47.4   < > 40.0 41.0 42.0  MCV 88.4  --   --   --   --   MCH 29.3  --   --   --   --   MCHC 33.1  --   --   --   --   RDW 14.9  --   --   --   --   PLT 362  --   --   --   --    < > = values in this interval not displayed.   Thyroid  Recent Labs  Lab 10/13/21 0714  TSH 0.466    BNPNo results for input(s): "BNP", "PROBNP" in the last 168 hours.  DDimer No results for input(s): "DDIMER" in the last 168 hours.   Radiology    CARDIAC CATHETERIZATION  Result Date: 10/13/2021 1.  Total occlusion of the right posterolateral branch with associated thrombus, unsuccessful PTCA due to inability to establish antegrade flow 2.  Marked calcification, ectasia, and tortuosity of the native RCA with mild to moderate nonobstructive plaquing throughout the vessel 3.  Patent left main 4.  Severe proximal and mid LAD stenosis  with tandem calcific lesions 5.  Patent left circumflex without significant stenosis 6.  Successful placement of right internal jugular central venous line with CVP 11 mmHg and mixed venous oxygen saturation 76% on norepinephrine 5 mics Recommendations: Dual antiplatelet therapy with aspirin and ticagrelor at least 12 months without interruption.  Consider staged atherectomy and PCI of the LAD prior to discharge.  Findings and plan discussed with patient's son.   ECHOCARDIOGRAM COMPLETE  Result Date: 10/13/2021    ECHOCARDIOGRAM REPORT   Patient Name:   Jon [name] Date of Exam: 10/13/2021 Medical Rec #:  [id number]          Height:       70.0 in Accession #:    [id number]         Weight:       190.0 lb Date of Birth:  July 02, 1945           BSA:          2.042 m Patient Age:    51 years           BP:           128/76 mmHg Patient Gender: M                  HR:           58 bpm. Exam Location:  Inpatient Procedure: 2D Echo, Cardiac Doppler and Color Doppler Indications:    Syncope  History:  Patient has no prior history of Echocardiogram examinations.                 Arrythmias:Atrial Fibrillation, Signs/Symptoms:Murmur; Risk                 Factors:Diabetes and Hypertension.  Sonographer:    Memory Argue Referring Phys: Lequita Halt IMPRESSIONS  1. Left ventricular ejection fraction, by estimation, is 60 to 65%. The left ventricle has normal function. The left ventricle demonstrates regional wall motion abnormalities with basal inferior and basal inferolateral severe hypokinesis. There is mild concentric left ventricular hypertrophy. Left ventricular diastolic parameters are consistent with Grade II diastolic dysfunction (pseudonormalization).  2. Right ventricular systolic function is normal. The right ventricular size is normal. Tricuspid regurgitation signal is inadequate for assessing PA pressure.  3. The mitral valve is normal in structure. Mild mitral valve regurgitation. No evidence of mitral  stenosis.  4. The aortic valve is tricuspid. There is severe calcification of the aortic valve. Aortic valve regurgitation is not visualized. There appears to be some degree of aortic stenosis but the aortic valve was not fully interrogated by doppler. Would consider repeat limited echo with attention to aortic valve doppler interrogation.  5. The inferior vena cava is normal in size with greater than 50% respiratory variability, suggesting right atrial pressure of 3 mmHg. FINDINGS  Left Ventricle: Left ventricular ejection fraction, by estimation, is 60 to 65%. The left ventricle has normal function. The left ventricle demonstrates regional wall motion abnormalities. The left ventricular internal cavity size was normal in size. There is mild concentric left ventricular hypertrophy. Left ventricular diastolic parameters are consistent with Grade II diastolic dysfunction (pseudonormalization). Right Ventricle: The right ventricular size is normal. No increase in right ventricular wall thickness. Right ventricular systolic function is normal. Tricuspid regurgitation signal is inadequate for assessing PA pressure. Left Atrium: Left atrial size was normal in size. Right Atrium: Right atrial size was normal in size. Pericardium: There is no evidence of pericardial effusion. Mitral Valve: The mitral valve is normal in structure. There is mild calcification of the mitral valve leaflet(s). Mild mitral annular calcification. Mild mitral valve regurgitation. No evidence of mitral valve stenosis. Tricuspid Valve: The tricuspid valve is normal in structure. Tricuspid valve regurgitation is not demonstrated. Aortic Valve: The aortic valve is tricuspid. There is severe calcifcation of the aortic valve. Aortic valve regurgitation is not visualized. Aortic valve mean gradient measures 3.0 mmHg. Aortic valve peak gradient measures 6.4 mmHg. Aortic valve area, by  VTI measures 1.81 cm. Pulmonic Valve: The pulmonic valve was normal  in structure. Pulmonic valve regurgitation is not visualized. Aorta: The aortic root is normal in size and structure. Venous: The inferior vena cava is normal in size with greater than 50% respiratory variability, suggesting right atrial pressure of 3 mmHg. IAS/Shunts: No atrial level shunt detected by color flow Doppler.  LEFT VENTRICLE PLAX 2D LVIDd:         4.10 cm   Diastology LVIDs:         2.80 cm   LV e' medial:    5.44 cm/s LV PW:         1.20 cm   LV E/e' medial:  16.0 LV IVS:        1.30 cm   LV e' lateral:   10.00 cm/s LVOT diam:     2.00 cm   LV E/e' lateral: 8.7 LV SV:         48 LV SV Index:  24 LVOT Area:     3.14 cm  RIGHT VENTRICLE TAPSE (M-mode): 1.7 cm LEFT ATRIUM             Index        RIGHT ATRIUM           Index LA diam:        3.20 cm 1.57 cm/m   RA Area:     15.30 cm LA Vol (A2C):   59.1 ml 28.94 ml/m  RA Volume:   35.40 ml  17.33 ml/m LA Vol (A4C):   50.1 ml 24.53 ml/m LA Biplane Vol: 54.6 ml 26.73 ml/m  AORTIC VALVE AV Area (Vmax):    1.73 cm AV Area (Vmean):   1.67 cm AV Area (VTI):     1.81 cm AV Vmax:           126.00 cm/s AV Vmean:          82.500 cm/s AV VTI:            0.266 m AV Peak Grad:      6.4 mmHg AV Mean Grad:      3.0 mmHg LVOT Vmax:         69.30 cm/s LVOT Vmean:        43.900 cm/s LVOT VTI:          0.153 m LVOT/AV VTI ratio: 0.58  AORTA Ao Root diam: 3.50 cm MITRAL VALVE MV Area (PHT): 3.60 cm    SHUNTS MV Decel Time: 211 msec    Systemic VTI:  0.15 m MR Peak grad: 86.9 mmHg    Systemic Diam: 2.00 cm MR Vmax:      466.00 cm/s MV E velocity: 87.00 cm/s MV A velocity: 78.50 cm/s MV E/A ratio:  1.11 Dalton McleanMD Electronically signed by Franki Monte Signature Date/Time: 10/13/2021/3:25:43 PM    Final    DG Chest Port 1 View  Result Date: 10/13/2021 CLINICAL DATA:  Chest pain. EXAM: PORTABLE CHEST 1 VIEW COMPARISON:  11/23/2015 FINDINGS: 0522 hours. Low volume film. The lungs are clear without focal pneumonia, edema, pneumothorax or pleural effusion. The  cardiopericardial silhouette is within normal limits for size. The visualized bony structures of the thorax are unremarkable. Telemetry leads overlie the chest. IMPRESSION: No active disease. Electronically Signed   By: Misty Stanley M.D.   On: 10/13/2021 06:03    Cardiac Studies   See above  Patient Profile     76 y.o. male with h/o PAF, HTN, DM, prior tobacco abuse and sleep apnea admitted 10/13/21 with chest pain, dizziness and near syncope. EKG with inferior ST elevation. Cardiac cath with occluded posterolateral branch felt to be the culprit lesion but flow could not be restored with balloon angioplasty. Also with severe serial mid LAD lesions.   Assessment & Plan    CAD/Inferior STEMI: Doing well this am with no chest pain. BP is stable. Culprit vessel for MI is the right posterolateral branch. Flow could not be restored in this branch despite balloon angioplasty and medical therapy. -Planning for staged PCI of the calcified mid LAD lesion tomorrow with orbital atherectomy.        -Will continue DAPT with ASA and Brilinta.        -Continue high intensity statin.        -No beta blocker with bradycardia. BP too soft for          addition of ARB or Ace/inh.        -NPO at midnight  for PCI tomorrow.   2.   DM: FSBS 89 today. No current therapy for DM  For questions or updates, please contact [place] Please consult [email or web address] for contact info under        Signed, Lauree Chandler, MD  10/14/2021, 6:58 AM

## 2021-10-15 ENCOUNTER — Inpatient Hospital Stay (HOSPITAL_COMMUNITY): Payer: Medicare Other

## 2021-10-15 ENCOUNTER — Encounter (HOSPITAL_COMMUNITY)
Admission: EM | Disposition: A | Discharge status: Home / Self Care | Payer: Self-pay | Source: Home / Self Care | Attending: Thoracic Surgery (Cardiothoracic Vascular Surgery)

## 2021-10-15 DIAGNOSIS — I200 Unstable angina: Secondary | ICD-10-CM | POA: Diagnosis not present

## 2021-10-15 DIAGNOSIS — I25110 Atherosclerotic heart disease of native coronary artery with unstable angina pectoris: Secondary | ICD-10-CM | POA: Diagnosis not present

## 2021-10-15 DIAGNOSIS — R9431 Abnormal electrocardiogram [ECG] [EKG]: Secondary | ICD-10-CM | POA: Diagnosis not present

## 2021-10-15 DIAGNOSIS — I2510 Atherosclerotic heart disease of native coronary artery without angina pectoris: Secondary | ICD-10-CM | POA: Diagnosis not present

## 2021-10-15 DIAGNOSIS — I480 Paroxysmal atrial fibrillation: Secondary | ICD-10-CM

## 2021-10-15 DIAGNOSIS — I350 Nonrheumatic aortic (valve) stenosis: Secondary | ICD-10-CM

## 2021-10-15 DIAGNOSIS — E1159 Type 2 diabetes mellitus with other circulatory complications: Secondary | ICD-10-CM | POA: Diagnosis not present

## 2021-10-15 HISTORY — PX: CORONARY BALLOON ANGIOPLASTY: CATH118233

## 2021-10-15 HISTORY — PX: LEFT HEART CATH AND CORONARY ANGIOGRAPHY: CATH118249

## 2021-10-15 LAB — LIPOPROTEIN A (LPA): Lipoprotein (a): 18.6 nmol/L (ref <75.0)

## 2021-10-15 LAB — RENAL FUNCTION PANEL
Albumin: 3.7 g/dL (ref 3.5–5.0)
Anion gap: 8 (ref 5–15)
BUN: 11 mg/dL (ref 8–23)
CO2: 26 mmol/L (ref 22–32)
Calcium: 9.3 mg/dL (ref 8.9–10.3)
Chloride: 108 mmol/L (ref 98–111)
Creatinine, Ser: 1.28 mg/dL — ABNORMAL HIGH (ref 0.61–1.24)
GFR, Estimated: 58 mL/min — ABNORMAL LOW (ref >60)
Glucose, Bld: 103 mg/dL — ABNORMAL HIGH (ref 70–99)
Phosphorus: 2.4 mg/dL — ABNORMAL LOW (ref 2.5–4.6)
Potassium: 4.4 mmol/L (ref 3.5–5.1)
Sodium: 142 mmol/L (ref 135–145)

## 2021-10-15 LAB — ECHOCARDIOGRAM LIMITED
AR max vel: 1.83 cm2
AV Area VTI: 1.92 cm2
AV Area mean vel: 1.95 cm2
AV Mean grad: 9 mmHg
AV Peak grad: 14.3 mmHg
Ao pk vel: 1.89 m/s
Height: 70 in
Weight: 3065.28 oz

## 2021-10-15 LAB — CBC
HCT: 39.9 % (ref 39.0–52.0)
Hemoglobin: 13.3 g/dL (ref 13.0–17.0)
MCH: 29.2 pg (ref 26.0–34.0)
MCHC: 33.3 g/dL (ref 30.0–36.0)
MCV: 87.5 fL (ref 80.0–100.0)
Platelets: 303 10*3/uL (ref 150–400)
RBC: 4.56 MIL/uL (ref 4.22–5.81)
RDW: 14.9 % (ref 11.5–15.5)
WBC: 10.3 10*3/uL (ref 4.0–10.5)
nRBC: 0 % (ref 0.0–0.2)

## 2021-10-15 LAB — POCT ACTIVATED CLOTTING TIME
Activated Clotting Time: 335 seconds
Activated Clotting Time: 239 seconds
Activated Clotting Time: 498 seconds
Activated Clotting Time: >1000 seconds

## 2021-10-15 LAB — MAGNESIUM: Magnesium: 1.9 mg/dL (ref 1.7–2.4)

## 2021-10-15 LAB — GLUCOSE, CAPILLARY
Glucose-Capillary: 102 mg/dL — ABNORMAL HIGH (ref 70–99)
Glucose-Capillary: 90 mg/dL (ref 70–99)
Glucose-Capillary: 144 mg/dL — ABNORMAL HIGH (ref 70–99)

## 2021-10-15 SURGERY — LEFT HEART CATH AND CORONARY ANGIOGRAPHY
Anesthesia: LOCAL

## 2021-10-15 MED ORDER — HEPARIN SODIUM (PORCINE) 1000 UNIT/ML IJ SOLN
INTRAMUSCULAR | Status: DC | PRN
  Administered 2021-10-15 (×2): 4000 [IU] via INTRAVENOUS
  Administered 2021-10-15: 5000 [IU] via INTRAVENOUS

## 2021-10-15 MED ORDER — FENTANYL CITRATE (PF) 100 MCG/2ML IJ SOLN
INTRAMUSCULAR | Status: DC | PRN
  Administered 2021-10-15: 25 ug via INTRAVENOUS

## 2021-10-15 MED ORDER — HEPARIN (PORCINE) IN NACL 1000-0.9 UT/500ML-% IV SOLN
INTRAVENOUS | Status: AC
  Filled 2021-10-15: qty 1000

## 2021-10-15 MED ORDER — HYDRALAZINE HCL 20 MG/ML IJ SOLN: 10.0000 mg | INTRAMUSCULAR | Status: AC | PRN

## 2021-10-15 MED ORDER — IOHEXOL 350 MG/ML SOLN
INTRAVENOUS | Status: DC | PRN
  Administered 2021-10-15: 145 mL

## 2021-10-15 MED ORDER — LABETALOL HCL 5 MG/ML IV SOLN: 10.0000 mg | INTRAVENOUS | Status: AC | PRN

## 2021-10-15 MED ORDER — VERAPAMIL HCL 2.5 MG/ML IV SOLN
INTRAVENOUS | Status: DC | PRN
  Administered 2021-10-15: 10 mL via INTRA_ARTERIAL

## 2021-10-15 MED ORDER — VERAPAMIL HCL 2.5 MG/ML IV SOLN
INTRAVENOUS | Status: AC
  Filled 2021-10-15: qty 2

## 2021-10-15 MED ORDER — MIDAZOLAM HCL 2 MG/2ML IJ SOLN
INTRAMUSCULAR | Status: DC | PRN
  Administered 2021-10-15 (×2): 1 mg via INTRAVENOUS

## 2021-10-15 MED ORDER — HEPARIN (PORCINE) 25000 UT/250ML-% IV SOLN
1500.0000 [IU]/h | INTRAVENOUS | Status: DC
  Administered 2021-10-15: 1200 [IU]/h via INTRAVENOUS
  Administered 2021-10-16 – 2021-10-18 (×3): 1350 [IU]/h via INTRAVENOUS
  Administered 2021-10-19 – 2021-10-20 (×3): 1400 [IU]/h via INTRAVENOUS
  Administered 2021-10-21: 1500 [IU]/h via INTRAVENOUS
  Filled 2021-10-15 (×8): qty 250

## 2021-10-15 MED ORDER — ONDANSETRON HCL 4 MG/2ML IJ SOLN: 4.0000 mg | Freq: Four times a day (QID) | INTRAMUSCULAR | Status: DC | PRN

## 2021-10-15 MED ORDER — HEPARIN SODIUM (PORCINE) 1000 UNIT/ML IJ SOLN
INTRAMUSCULAR | Status: AC
  Filled 2021-10-15: qty 10

## 2021-10-15 MED ORDER — SODIUM CHLORIDE 0.9 % IV SOLN: INTRAVENOUS | Status: AC

## 2021-10-15 MED ORDER — LIDOCAINE HCL (PF) 1 % IJ SOLN
INTRAMUSCULAR | Status: DC | PRN
  Administered 2021-10-15: 2 mL

## 2021-10-15 MED ORDER — LIDOCAINE HCL (PF) 1 % IJ SOLN
INTRAMUSCULAR | Status: AC
  Filled 2021-10-15: qty 30

## 2021-10-15 MED ORDER — SODIUM CHLORIDE 0.9% FLUSH
3.0000 mL | Freq: Two times a day (BID) | INTRAVENOUS | Status: DC
  Administered 2021-10-16 – 2021-10-17 (×4): 3 mL via INTRAVENOUS
  Administered 2021-10-18: 10 mL via INTRAVENOUS
  Administered 2021-10-19 – 2021-10-21 (×4): 3 mL via INTRAVENOUS

## 2021-10-15 MED ORDER — FENTANYL CITRATE (PF) 100 MCG/2ML IJ SOLN
INTRAMUSCULAR | Status: AC
  Filled 2021-10-15: qty 2

## 2021-10-15 MED ORDER — SODIUM CHLORIDE 0.9 % IV SOLN: 250.0000 mL | INTRAVENOUS | Status: DC | PRN

## 2021-10-15 MED ORDER — MIDAZOLAM HCL 2 MG/2ML IJ SOLN
INTRAMUSCULAR | Status: AC
  Filled 2021-10-15: qty 2

## 2021-10-15 MED ORDER — HEPARIN (PORCINE) IN NACL 1000-0.9 UT/500ML-% IV SOLN
INTRAVENOUS | Status: DC | PRN
  Administered 2021-10-15 (×4): 500 mL

## 2021-10-15 MED ORDER — SODIUM CHLORIDE 0.9 % IV BOLUS
INTRAVENOUS | Status: AC | PRN
  Administered 2021-10-15: 250 mL via INTRAVENOUS

## 2021-10-15 MED ORDER — SODIUM CHLORIDE 0.9% FLUSH: 3.0000 mL | INTRAVENOUS | Status: DC | PRN

## 2021-10-15 MED ORDER — ACETAMINOPHEN 325 MG PO TABS: 650.0000 mg | ORAL_TABLET | ORAL | Status: DC | PRN

## 2021-10-15 SURGICAL SUPPLY — 26 items
BALLN MINITREK OTW 2.0X12 (BALLOONS) ×1
BALLN SAPPHIRE 1.0X15 (BALLOONS) ×1
BALLOON MINITREK OTW 2.0X12 (BALLOONS) IMPLANT
BALLOON SAPPHIRE 1.0X15 (BALLOONS) IMPLANT
BALLOON TAKERU 1.5X6 (BALLOONS) IMPLANT
BALLOON TAKERU OTW 1.5X6 (BALLOONS) IMPLANT
CATH DIAG 6FR JR4 (CATHETERS) IMPLANT
CATH INFINITI 6F FL3.5 (CATHETERS) IMPLANT
CATH TELEPORT (CATHETERS) IMPLANT
CATH TELESCOPE 6F GEC (CATHETERS) IMPLANT
CATH VISTA GUIDE 7FRXB LAD 3.5 (CATHETERS) IMPLANT
DEVICE RAD COMP TR BAND LRG (VASCULAR PRODUCTS) IMPLANT
ELECT DEFIB PAD ADLT CADENCE (PAD) IMPLANT
GLIDESHEATH SLEND SS 6F .021 (SHEATH) IMPLANT
GUIDEWIRE INQWIRE 1.5J.035X260 (WIRE) IMPLANT
INQWIRE 1.5J .035X260CM (WIRE) ×1
KIT ENCORE 26 ADVANTAGE (KITS) IMPLANT
KIT HEART LEFT (KITS) ×2 IMPLANT
PACK CARDIAC CATHETERIZATION (CUSTOM PROCEDURE TRAY) ×2 IMPLANT
SHEATH PINNACLE 6F 10CM (SHEATH) IMPLANT
TRANSDUCER W/STOPCOCK (MISCELLANEOUS) ×2 IMPLANT
TUBING CIL FLEX 10 FLL-RA (TUBING) ×2 IMPLANT
WIRE EMERALD 3MM-J .035X260CM (WIRE) IMPLANT
WIRE HI TORQ WHISPER MS 300CM (WIRE) IMPLANT
WIRE RUNTHROUGH IZANAI 014 300 (WIRE) IMPLANT
WIRE VIPERWIRE COR FLEX .012 (WIRE) IMPLANT

## 2021-10-15 NOTE — Progress Notes (Signed)
Echocardiogram 2D Echocardiogram has been performed.  Oneal Deputy Mishal Probert RDCS 10/15/2021, 9:37 AM

## 2021-10-15 NOTE — Interval H&P Note (Signed)
History and Physical Interval Note:  10/15/2021 10:12 AM  Jon [name]  has presented today for surgery, with the diagnosis of cad.  The various methods of treatment have been discussed with the patient and family. After consideration of risks, benefits and other options for treatment, the patient has consented to  Procedure(s): CORONARY ATHERECTOMY (N/A) as a surgical intervention.  The patient's history has been reviewed, patient examined, no change in status, stable for surgery.  I have reviewed the patient's chart and labs.  Questions were answered to the patient's satisfaction.     [name]

## 2021-10-15 NOTE — Progress Notes (Signed)
ANTICOAGULATION CONSULT NOTE   Pharmacy Consult for heparin Indication: atrial fibrillation  No Known Allergies  Patient Measurements: Height: '5\' 10"'$  (177.8 cm) Weight: 86.9 kg (191 lb 9.3 oz) IBW/kg (Calculated) : 73 Heparin Dosing Weight: 86.2 kg  Vital Signs: Temp: 98.7 F (37.1 C) (09/15 0819) Temp Source: Oral (09/15 0819) BP: 100/80 (09/15 1357) Pulse Rate: 65 (09/15 1357)  Labs: Recent Labs    10/13/21 0510 10/13/21 0714 10/13/21 1150 10/13/21 1327 10/13/21 1547 10/13/21 1628 10/13/21 1854 10/14/21 0623 10/15/21 0639  HGB 15.7   < >  --   --    < > 14.3  --  14.9 13.3  HCT 47.4   < >  --   --    < > 42.0  --  43.9 39.9  PLT 362  --   --   --   --   --   --  351 303  CREATININE 1.87*   < >  --  1.49*  --   --   --  1.33* 1.28*  TROPONINIHS 11   < > 9,868* 9,340*  --   --  15,166*  --   --    < > = values in this interval not displayed.    Estimated Creatinine Clearance: 50.7 mL/min (A) (by C-G formula based on SCr of 1.28 mg/dL (H)).   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 pylori infection 2011   Heart murmur    History of radiation therapy 09/20/10 thru 10/29/10   gastroesophageal junction/gastric cardia   Hypertension    Neuromuscular disorder ([place])    Other and unspecified hyperlipidemia    Sleep apnea    On CPAP   Status post chemotherapy 09/22/10 thru 10/27/10   concurrent w/radiation   Unspecified sleep apnea     Medications:  Infusions:   sodium chloride     sodium chloride     sodium chloride     heparin      Assessment: 76 yo male with CAD awaiting surgical evaluation.  Pharmacy asked to restart  heparin s/p cath for afib.  No overt bleeding or complications noted currently, but previously heparin was held for neck hematoma at line site.    Sheath out at ~ 2 PM.  Pharmacy asked to start heparin 8 hrs after this time.  Goal of Therapy:  Heparin level 0.3-0.7 units/ml Monitor platelets by anticoagulation protocol: Yes   Plan:  Start IV heparin at 1200 units/hr  Check heparin level 8 hrs after heparin starts. Daily heparin level and CBC. F/u plans for surgery.  [place] Crane, Roylene Reason, BCCP Clinical Pharmacist  10/15/2021 3:41 PM   [facility] pharmacy phone numbers are listed on [email or web address]
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
## 2021-10-15 NOTE — H&P (View-Only) (Signed)
Rounding Note    Patient Name: Jon [name] Date of Encounter: 10/15/2021  Jon [name] Cardiologist: Jon [name]  Subjective   No chest pain. No events overnight.   Inpatient Medications    Scheduled Meds:  aspirin  81 mg Oral Daily   atorvastatin  80 mg Oral Daily   atropine  0.5 mg Intravenous Once   Chlorhexidine Gluconate Cloth  6 each Topical Daily   enoxaparin (LOVENOX) injection  40 mg Subcutaneous Daily   fenofibrate  160 mg Oral Daily   insulin aspart  0-15 Units Subcutaneous TID WC   pantoprazole  40 mg Oral Daily   sodium chloride flush  3 mL Intravenous Q12H   sodium chloride flush  3 mL Intravenous Q12H   ticagrelor  90 mg Oral BID   Continuous Infusions:  sodium chloride     sodium chloride     sodium chloride     sodium chloride 1 mL/kg/hr (10/15/21 0800)   PRN Meds: sodium chloride, sodium chloride, sodium chloride, acetaminophen, HYDROcodone-acetaminophen, loratadine, ondansetron (ZOFRAN) IV, mouth rinse, sodium chloride flush, sodium chloride flush, sodium chloride flush   Vital Signs    Vitals:   10/15/21 0600 10/15/21 0700 10/15/21 0800 10/15/21 0819  BP: 109/84 (!) 88/71 93/68   Pulse: (!) 56 62 60   Resp: '18 19 15   '$ Temp:    98.7 F (37.1 C)  TempSrc:    Oral  SpO2: 99% 94% 95%   Weight: 86.9 kg     Height:        Intake/Output Summary (Last 24 hours) at 10/15/2021 0841 Last data filed at 10/15/2021 0800 Gross per 24 hour  Intake 1937.2 ml  Output 650 ml  Net 1287.2 ml      10/15/2021    6:00 AM 10/14/2021    5:00 AM 10/13/2021    5:03 AM  Last 3 Weights  Weight (lbs) 191 lb 9.3 oz 196 lb 3.4 oz 190 lb  Weight (kg) 86.9 kg 89 kg 86.183 kg      Telemetry    Sinus brady - Personally Reviewed  ECG    No new tracing this am. - Personally Reviewed  Physical Exam   GEN: No acute distress.   Neck: No JVD. Bruising over IJ site. Soft Cardiac: Jon [name], soft systolic murmur. Marland Kitchen  Respiratory: Clear to auscultation  bilaterally. GI: Soft, nontender, non-distended  MS: No edema; No deformity. Neuro:  Nonfocal  Psych: Normal affect   Labs    High Sensitivity Troponin:   Recent Labs  Lab 10/13/21 0714 10/13/21 0836 10/13/21 1150 10/13/21 1327 10/13/21 1854  TROPONINIHS 397* 1,101* 9,868* 9,340* 15,166*     Chemistry Recent Labs  Lab 10/13/21 0510 10/13/21 0845 10/13/21 1327 10/13/21 1547 10/13/21 1614 10/13/21 1628 10/14/21 0623  NA 139 140 137   < > 140 139 140  K 4.1 3.9 4.6   < > 4.8 5.0 3.6  CL 104 106 108  --   --   --  106  CO2 25  --  22  --   --   --  24  GLUCOSE 123* 138* 123*  --   --   --  130*  BUN '16 15 13  '$ --   --   --  11  CREATININE 1.87* 1.60* 1.49*  --   --   --  1.33*  CALCIUM 9.9  --  8.6*  --   --   --  9.3  PROT 6.9  --   --   --   --   --   --  ALBUMIN 4.2  --   --   --   --   --   --   AST 30  --   --   --   --   --   --   ALT 19  --   --   --   --   --   --   ALKPHOS 45  --   --   --   --   --   --   BILITOT 0.7  --   --   --   --   --   --   GFRNONAA 37*  --  48*  --   --   --  55*  ANIONGAP 10  --  7  --   --   --  10   < > = values in this interval not displayed.    Lipids  Recent Labs  Lab 10/14/21 0623  CHOL 135  TRIG 164*  HDL 42  LDLCALC 60  CHOLHDL 3.2    Hematology Recent Labs  Lab 10/13/21 0510 10/13/21 0836 10/13/21 1628 10/14/21 0623 10/15/21 0639  WBC 9.0  --   --  11.5* 10.3  RBC 5.36  --   --  5.01 4.56  HGB 15.7   < > 14.3 14.9 13.3  HCT 47.4   < > 42.0 43.9 39.9  MCV 88.4  --   --  87.6 87.5  MCH 29.3  --   --  29.7 29.2  MCHC 33.1  --   --  33.9 33.3  RDW 14.9  --   --  15.3 14.9  PLT 362  --   --  351 303   < > = values in this interval not displayed.   Thyroid  Recent Labs  Lab 10/13/21 0714  TSH 0.466    BNPNo results for input(s): "BNP", "PROBNP" in the last 168 hours.  DDimer No results for input(s): "DDIMER" in the last 168 hours.   Radiology    CARDIAC CATHETERIZATION  Result Date:  10/13/2021 1.  Total occlusion of the right posterolateral branch with associated thrombus, unsuccessful PTCA due to inability to establish antegrade flow 2.  Marked calcification, ectasia, and tortuosity of the native RCA with mild to moderate nonobstructive plaquing throughout the vessel 3.  Patent left main 4.  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  Patent left circumflex without significant stenosis 6.  Successful placement of right internal jugular central venous line with CVP 11 mmHg and mixed venous oxygen saturation 76% on norepinephrine 5 mics Recommendations: Dual antiplatelet therapy with aspirin and ticagrelor at least 12 months without interruption.  Consider staged atherectomy and PCI of the LAD prior to discharge.  Findings and plan discussed with patient's son.   ECHOCARDIOGRAM COMPLETE  Result Date: 10/13/2021    ECHOCARDIOGRAM REPORT   Patient Name:   Jon [name] Date of Exam: 10/13/2021 Medical Rec #:  [id number]          Height:       70.0 in Accession #:    [id number]         Weight:       190.0 lb Date of Birth:  [birth year]           BSA:          2.042 m Patient Age:    76 years           BP:  128/76 mmHg Patient Gender: M                  HR:           58 bpm. Exam Location:  Inpatient Procedure: 2D Echo, Cardiac Doppler and Color Doppler Indications:    Syncope  History:        Patient has no prior history of Echocardiogram examinations.                 Arrythmias:Atrial Fibrillation, Signs/Symptoms:Murmur; Risk                 Factors:Diabetes and Hypertension.  Sonographer:    Jon [name] Referring Phys: Jon [name] IMPRESSIONS  1. Left ventricular ejection fraction, by estimation, is 60 to 65%. The left ventricle has normal function. The left ventricle demonstrates regional wall motion abnormalities with basal inferior and basal inferolateral severe hypokinesis. There is mild concentric left ventricular hypertrophy. Left ventricular  diastolic parameters are consistent with Grade II diastolic dysfunction (pseudonormalization).  2. Right ventricular systolic function is normal. The right ventricular size is normal. Tricuspid regurgitation signal is inadequate for assessing PA pressure.  3. The mitral valve is normal in structure. Mild mitral valve regurgitation. No evidence of mitral stenosis.  4. The aortic valve is tricuspid. There is severe calcification of the aortic valve. Aortic valve regurgitation is not visualized. There appears to be some degree of aortic stenosis but the aortic valve was not fully interrogated by doppler. Would consider repeat limited echo with attention to aortic valve doppler interrogation.  5. The inferior vena cava is normal in size with greater than 50% respiratory variability, suggesting right atrial pressure of 3 mmHg. FINDINGS  Left Ventricle: Left ventricular ejection fraction, by estimation, is 60 to 65%. The left ventricle has normal function. The left ventricle demonstrates regional wall motion abnormalities. The left ventricular internal cavity size was normal in size. There is mild concentric left ventricular hypertrophy. Left ventricular diastolic parameters are consistent with Grade II diastolic dysfunction (pseudonormalization). Right Ventricle: The right ventricular size is normal. No increase in right ventricular wall thickness. Right ventricular systolic function is normal. Tricuspid regurgitation signal is inadequate for assessing PA pressure. Left Atrium: Left atrial size was normal in size. Right Atrium: Right atrial size was normal in size. Pericardium: There is no evidence of pericardial effusion. Mitral Valve: The mitral valve is normal in structure. There is mild calcification of the mitral valve leaflet(s). Mild mitral annular calcification. Mild mitral valve regurgitation. No evidence of mitral valve stenosis. Tricuspid Valve: The tricuspid valve is normal in structure. Tricuspid valve  regurgitation is not demonstrated. Aortic Valve: The aortic valve is tricuspid. There is severe calcifcation of the aortic valve. Aortic valve regurgitation is not visualized. Aortic valve mean gradient measures 3.0 mmHg. Aortic valve peak gradient measures 6.4 mmHg. Aortic valve area, by  VTI measures 1.81 cm. Pulmonic Valve: The pulmonic valve was normal in structure. Pulmonic valve regurgitation is not visualized. Aorta: The aortic root is normal in size and structure. Venous: The inferior vena cava is normal in size with greater than 50% respiratory variability, suggesting right atrial pressure of 3 mmHg. IAS/Shunts: No atrial level shunt detected by color flow Doppler.  LEFT VENTRICLE PLAX 2D LVIDd:         4.10 cm   Diastology LVIDs:         2.80 cm   LV e' medial:    5.44 cm/s LV PW:  1.20 cm   LV E/e' medial:  16.0 LV IVS:        1.30 cm   LV e' lateral:   10.00 cm/s LVOT diam:     2.00 cm   LV E/e' lateral: 8.7 LV SV:         48 LV SV Index:   24 LVOT Area:     3.14 cm  RIGHT VENTRICLE TAPSE (M-mode): 1.7 cm LEFT ATRIUM             Index        RIGHT ATRIUM           Index LA diam:        3.20 cm 1.57 cm/m   RA Area:     15.30 cm LA Vol (A2C):   59.1 ml 28.94 ml/m  RA Volume:   35.40 ml  17.33 ml/m LA Vol (A4C):   50.1 ml 24.53 ml/m LA Biplane Vol: 54.6 ml 26.73 ml/m  AORTIC VALVE AV Area (Vmax):    1.73 cm AV Area (Vmean):   1.67 cm AV Area (VTI):     1.81 cm AV Vmax:           126.00 cm/s AV Vmean:          82.500 cm/s AV VTI:            0.266 m AV Peak Grad:      6.4 mmHg AV Mean Grad:      3.0 mmHg LVOT Vmax:         69.30 cm/s LVOT Vmean:        43.900 cm/s LVOT VTI:          0.153 m LVOT/AV VTI ratio: 0.58  AORTA Ao Root diam: 3.50 cm MITRAL VALVE MV Area (PHT): 3.60 cm    SHUNTS MV Decel Time: 211 msec    Systemic VTI:  0.15 m MR Peak grad: 86.9 mmHg    Systemic Diam: 2.00 cm MR Vmax:      466.00 cm/s MV E velocity: 87.00 cm/s MV A velocity: 78.50 cm/s MV E/A ratio:  1.11 Dalton  McleanMD Electronically signed by Franki Monte Signature Date/Time: 10/13/2021/3:25:43 PM    Final     Cardiac Studies   See above  Patient Profile     76 y.o. male with h/o PAF, HTN, DM, prior tobacco abuse and sleep apnea admitted 10/13/21 with chest pain, dizziness and near syncope. EKG with inferior ST elevation. Cardiac cath with occluded posterolateral branch felt to be the culprit lesion but flow could not be restored with balloon angioplasty. Also with severe serial mid LAD lesions.   Assessment & Plan    CAD/Inferior STEMI: He has no chest pain. No events overnight. BP is stable. The culprit vessel for his MI is the right posterolateral branch. Flow could not be restored in this branch despite balloon angioplasty and medical therapy. Planning for staged PCI of the LAD today with orbital atherectomy. Continue DAPT with ASA and Brilinta. Continue high intensity statin. No beta blocker with bradycardia. BP too soft for addition of ARB or Ace/inh.          DM: FSBS 90 today. No current therapy for DM  Aortic stenosis: Echo with thickened and calcified aortic valve with measured mean gradient of 3 mmHg and AVA 1.8 cm2 but the valve visually appears to have at least moderate stenosis but the valve was not fully interrogated by doppler. Will get repeat limited echo today to  focus on the aortic valve with full doppler interrogation.    For questions or updates, please contact [place] Please consult [email or web address] for contact info under        Signed, Lauree Chandler, MD  10/15/2021, 8:41 AM

## 2021-10-15 NOTE — Progress Notes (Signed)
Patient not ready for CPAP at this time. RN stated she would place patient on CPAP once he was back in the bed and ready to go on it.

## 2021-10-15 NOTE — Progress Notes (Signed)
Rounding Note    Patient Name: Jon [name] Date of Encounter: 10/15/2021  Why Cardiologist: Harrington Challenger  Subjective   No chest pain. No events overnight.   Inpatient Medications    Scheduled Meds:  aspirin  81 mg Oral Daily   atorvastatin  80 mg Oral Daily   atropine  0.5 mg Intravenous Once   Chlorhexidine Gluconate Cloth  6 each Topical Daily   enoxaparin (LOVENOX) injection  40 mg Subcutaneous Daily   fenofibrate  160 mg Oral Daily   insulin aspart  0-15 Units Subcutaneous TID WC   pantoprazole  40 mg Oral Daily   sodium chloride flush  3 mL Intravenous Q12H   sodium chloride flush  3 mL Intravenous Q12H   ticagrelor  90 mg Oral BID   Continuous Infusions:  sodium chloride     sodium chloride     sodium chloride     sodium chloride 1 mL/kg/hr (10/15/21 0800)   PRN Meds: sodium chloride, sodium chloride, sodium chloride, acetaminophen, HYDROcodone-acetaminophen, loratadine, ondansetron (ZOFRAN) IV, mouth rinse, sodium chloride flush, sodium chloride flush, sodium chloride flush   Vital Signs    Vitals:   10/15/21 0600 10/15/21 0700 10/15/21 0800 10/15/21 0819  BP: 109/84 (!) 88/71 93/68   Pulse: (!) 56 62 60   Resp: '18 19 15   '$ Temp:    98.7 F (37.1 C)  TempSrc:    Oral  SpO2: 99% 94% 95%   Weight: 86.9 kg     Height:        Intake/Output Summary (Last 24 hours) at 10/15/2021 0841 Last data filed at 10/15/2021 0800 Gross per 24 hour  Intake 1937.2 ml  Output 650 ml  Net 1287.2 ml      10/15/2021    6:00 AM 10/14/2021    5:00 AM 10/13/2021    5:03 AM  Last 3 Weights  Weight (lbs) 191 lb 9.3 oz 196 lb 3.4 oz 190 lb  Weight (kg) 86.9 kg 89 kg 86.183 kg      Telemetry    Sinus brady - Personally Reviewed  ECG    No new tracing this am. - Personally Reviewed  Physical Exam   GEN: No acute distress.   Neck: No JVD. Bruising over IJ site. Soft Cardiac: Loletha Grayer, soft systolic murmur. Marland Kitchen  Respiratory: Clear to auscultation  bilaterally. GI: Soft, nontender, non-distended  MS: No edema; No deformity. Neuro:  Nonfocal  Psych: Normal affect   Labs    High Sensitivity Troponin:   Recent Labs  Lab 10/13/21 0714 10/13/21 0836 10/13/21 1150 10/13/21 1327 10/13/21 1854  TROPONINIHS 397* 1,101* 9,868* 9,340* 15,166*     Chemistry Recent Labs  Lab 10/13/21 0510 10/13/21 0845 10/13/21 1327 10/13/21 1547 10/13/21 1614 10/13/21 1628 10/14/21 0623  NA 139 140 137   < > 140 139 140  K 4.1 3.9 4.6   < > 4.8 5.0 3.6  CL 104 106 108  --   --   --  106  CO2 25  --  22  --   --   --  24  GLUCOSE 123* 138* 123*  --   --   --  130*  BUN '16 15 13  '$ --   --   --  11  CREATININE 1.87* 1.60* 1.49*  --   --   --  1.33*  CALCIUM 9.9  --  8.6*  --   --   --  9.3  PROT 6.9  --   --   --   --   --   --  ALBUMIN 4.2  --   --   --   --   --   --   AST 30  --   --   --   --   --   --   ALT 19  --   --   --   --   --   --   ALKPHOS 45  --   --   --   --   --   --   BILITOT 0.7  --   --   --   --   --   --   GFRNONAA 37*  --  48*  --   --   --  55*  ANIONGAP 10  --  7  --   --   --  10   < > = values in this interval not displayed.    Lipids  Recent Labs  Lab 10/14/21 0623  CHOL 135  TRIG 164*  HDL 42  LDLCALC 60  CHOLHDL 3.2    Hematology Recent Labs  Lab 10/13/21 0510 10/13/21 0836 10/13/21 1628 10/14/21 0623 10/15/21 0639  WBC 9.0  --   --  11.5* 10.3  RBC 5.36  --   --  5.01 4.56  HGB 15.7   < > 14.3 14.9 13.3  HCT 47.4   < > 42.0 43.9 39.9  MCV 88.4  --   --  87.6 87.5  MCH 29.3  --   --  29.7 29.2  MCHC 33.1  --   --  33.9 33.3  RDW 14.9  --   --  15.3 14.9  PLT 362  --   --  351 303   < > = values in this interval not displayed.   Thyroid  Recent Labs  Lab 10/13/21 0714  TSH 0.466    BNPNo results for input(s): "BNP", "PROBNP" in the last 168 hours.  DDimer No results for input(s): "DDIMER" in the last 168 hours.   Radiology    CARDIAC CATHETERIZATION  Result Date:  10/13/2021 1.  Total occlusion of the right posterolateral branch with associated thrombus, unsuccessful PTCA due to inability to establish antegrade flow 2.  Marked calcification, ectasia, and tortuosity of the native RCA with mild to moderate nonobstructive plaquing throughout the vessel 3.  Patent left main 4.  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  Patent left circumflex without significant stenosis 6.  Successful placement of right internal jugular central venous line with CVP 11 mmHg and mixed venous oxygen saturation 76% on norepinephrine 5 mics Recommendations: Dual antiplatelet therapy with aspirin and ticagrelor at least 12 months without interruption.  Consider staged atherectomy and PCI of the LAD prior to discharge.  Findings and plan discussed with patient's son.   ECHOCARDIOGRAM COMPLETE  Result Date: 10/13/2021    ECHOCARDIOGRAM REPORT   Patient Name:   Jon [name] Date of Exam: 10/13/2021 Medical Rec #:  [id number]          Height:       70.0 in Accession #:    [id number]         Weight:       190.0 lb Date of Birth:  10/15/1945           BSA:          2.042 m Patient Age:    76 years           BP:  128/76 mmHg Patient Gender: M                  HR:           58 bpm. Exam Location:  Inpatient Procedure: 2D Echo, Cardiac Doppler and Color Doppler Indications:    Syncope  History:        Patient has no prior history of Echocardiogram examinations.                 Arrythmias:Atrial Fibrillation, Signs/Symptoms:Murmur; Risk                 Factors:Diabetes and Hypertension.  Sonographer:    Memory Argue Referring Phys: Lequita Halt IMPRESSIONS  1. Left ventricular ejection fraction, by estimation, is 60 to 65%. The left ventricle has normal function. The left ventricle demonstrates regional wall motion abnormalities with basal inferior and basal inferolateral severe hypokinesis. There is mild concentric left ventricular hypertrophy. Left ventricular  diastolic parameters are consistent with Grade II diastolic dysfunction (pseudonormalization).  2. Right ventricular systolic function is normal. The right ventricular size is normal. Tricuspid regurgitation signal is inadequate for assessing PA pressure.  3. The mitral valve is normal in structure. Mild mitral valve regurgitation. No evidence of mitral stenosis.  4. The aortic valve is tricuspid. There is severe calcification of the aortic valve. Aortic valve regurgitation is not visualized. There appears to be some degree of aortic stenosis but the aortic valve was not fully interrogated by doppler. Would consider repeat limited echo with attention to aortic valve doppler interrogation.  5. The inferior vena cava is normal in size with greater than 50% respiratory variability, suggesting right atrial pressure of 3 mmHg. FINDINGS  Left Ventricle: Left ventricular ejection fraction, by estimation, is 60 to 65%. The left ventricle has normal function. The left ventricle demonstrates regional wall motion abnormalities. The left ventricular internal cavity size was normal in size. There is mild concentric left ventricular hypertrophy. Left ventricular diastolic parameters are consistent with Grade II diastolic dysfunction (pseudonormalization). Right Ventricle: The right ventricular size is normal. No increase in right ventricular wall thickness. Right ventricular systolic function is normal. Tricuspid regurgitation signal is inadequate for assessing PA pressure. Left Atrium: Left atrial size was normal in size. Right Atrium: Right atrial size was normal in size. Pericardium: There is no evidence of pericardial effusion. Mitral Valve: The mitral valve is normal in structure. There is mild calcification of the mitral valve leaflet(s). Mild mitral annular calcification. Mild mitral valve regurgitation. No evidence of mitral valve stenosis. Tricuspid Valve: The tricuspid valve is normal in structure. Tricuspid valve  regurgitation is not demonstrated. Aortic Valve: The aortic valve is tricuspid. There is severe calcifcation of the aortic valve. Aortic valve regurgitation is not visualized. Aortic valve mean gradient measures 3.0 mmHg. Aortic valve peak gradient measures 6.4 mmHg. Aortic valve area, by  VTI measures 1.81 cm. Pulmonic Valve: The pulmonic valve was normal in structure. Pulmonic valve regurgitation is not visualized. Aorta: The aortic root is normal in size and structure. Venous: The inferior vena cava is normal in size with greater than 50% respiratory variability, suggesting right atrial pressure of 3 mmHg. IAS/Shunts: No atrial level shunt detected by color flow Doppler.  LEFT VENTRICLE PLAX 2D LVIDd:         4.10 cm   Diastology LVIDs:         2.80 cm   LV e' medial:    5.44 cm/s LV PW:  1.20 cm   LV E/e' medial:  16.0 LV IVS:        1.30 cm   LV e' lateral:   10.00 cm/s LVOT diam:     2.00 cm   LV E/e' lateral: 8.7 LV SV:         48 LV SV Index:   24 LVOT Area:     3.14 cm  RIGHT VENTRICLE TAPSE (M-mode): 1.7 cm LEFT ATRIUM             Index        RIGHT ATRIUM           Index LA diam:        3.20 cm 1.57 cm/m   RA Area:     15.30 cm LA Vol (A2C):   59.1 ml 28.94 ml/m  RA Volume:   35.40 ml  17.33 ml/m LA Vol (A4C):   50.1 ml 24.53 ml/m LA Biplane Vol: 54.6 ml 26.73 ml/m  AORTIC VALVE AV Area (Vmax):    1.73 cm AV Area (Vmean):   1.67 cm AV Area (VTI):     1.81 cm AV Vmax:           126.00 cm/s AV Vmean:          82.500 cm/s AV VTI:            0.266 m AV Peak Grad:      6.4 mmHg AV Mean Grad:      3.0 mmHg LVOT Vmax:         69.30 cm/s LVOT Vmean:        43.900 cm/s LVOT VTI:          0.153 m LVOT/AV VTI ratio: 0.58  AORTA Ao Root diam: 3.50 cm MITRAL VALVE MV Area (PHT): 3.60 cm    SHUNTS MV Decel Time: 211 msec    Systemic VTI:  0.15 m MR Peak grad: 86.9 mmHg    Systemic Diam: 2.00 cm MR Vmax:      466.00 cm/s MV E velocity: 87.00 cm/s MV A velocity: 78.50 cm/s MV E/A ratio:  1.11 Dalton  McleanMD Electronically signed by Franki Monte Signature Date/Time: 10/13/2021/3:25:43 PM    Final     Cardiac Studies   See above  Patient Profile     76 y.o. male with h/o PAF, HTN, DM, prior tobacco abuse and sleep apnea admitted 10/13/21 with chest pain, dizziness and near syncope. EKG with inferior ST elevation. Cardiac cath with occluded posterolateral branch felt to be the culprit lesion but flow could not be restored with balloon angioplasty. Also with severe serial mid LAD lesions.   Assessment & Plan    CAD/Inferior STEMI: He has no chest pain. No events overnight. BP is stable. The culprit vessel for his MI is the right posterolateral branch. Flow could not be restored in this branch despite balloon angioplasty and medical therapy. Planning for staged PCI of the LAD today with orbital atherectomy. Continue DAPT with ASA and Brilinta. Continue high intensity statin. No beta blocker with bradycardia. BP too soft for addition of ARB or Ace/inh.          DM: FSBS 90 today. No current therapy for DM  Aortic stenosis: Echo with thickened and calcified aortic valve with measured mean gradient of 3 mmHg and AVA 1.8 cm2 but the valve visually appears to have at least moderate stenosis but the valve was not fully interrogated by doppler. Will get repeat limited echo today to  focus on the aortic valve with full doppler interrogation.    For questions or updates, please contact [place] Please consult [email or web address] for contact info under        Signed, Lauree Chandler, MD  10/15/2021, 8:41 AM

## 2021-10-15 NOTE — Progress Notes (Signed)
PROGRESS NOTE  Jon [name] AST:[id number] DOB: [birth year]   PCP: [name], MD  Patient is from: Home.  Lives alone.  Independently ambulates at baseline.  DOA: 10/13/2021 LOS: 2  Chief complaints Chief Complaint  Patient presents with   Chest Pain   Shortness of Breath     Brief Narrative / Interim history: 76 year old M with PMH of DM-2, OSA on CPAP, paroxysmal A-fib, GEJ adenocarcinoma s/p XRT in 2012 and diverticulosis presented with new onset chest pain and near syncope associated with lightheadedness and blurry vision, and found to have inferior STEMI, bradycardia, hypotension and AKI.  Initial EKG showed atrial flutter with various transduction. Troponin trended from 11-15,000.  Subsequent EKG concerning for inferior ST elevation.  Cardiology consulted. LHC with occluded posterolateral branch felt to be the culprit lesion but flow could not be restored with balloon angioplasty.  LHC also showed severe serial mid LAD lesions.    Cardiology planning a staged PCI of the calcified mid LAD lesion on 9/15.  Patient is on DAPT with aspirin and Brilinta.  TTE with LVEF of 60 to 65%, RWMA and G2-DD  Subjective: Seen and examined earlier this morning before he went down for PCI.  No major events overnight of this morning.  No complaints.  Denies chest pain, dyspnea, palpitation or dizziness.  Objective: Vitals:   10/15/21 1342 10/15/21 1347 10/15/21 1352 10/15/21 1357  BP: '96/78 97/80 99/75 '$ 100/80  Pulse: (!) 56 67 70 65  Resp: '19 20 16 '$ (!) 21  Temp:      TempSrc:      SpO2: 100% 97% 99% 100%  Weight:      Height:        Examination:  GENERAL: No apparent distress.  Nontoxic. HEENT: MMM.  Vision and hearing grossly intact.  NECK: Supple.  No apparent JVD.  RESP:  No IWOB.  Fair aeration bilaterally. CVS: HR 56.  Regular.  Heart sounds normal.  ABD/GI/GU: BS+. Abd soft, NTND.  MSK/EXT:  Moves extremities. No apparent deformity. No edema.  SKIN: no apparent  skin lesion or wound NEURO: Awake and alert. Oriented appropriately.  No apparent focal neuro deficit. PSYCH: Calm. Normal affect.   Procedures:  9/13-LHC with occluded posterolateral branch felt to be the culprit lesion but flow could not be restored with balloon angioplasty.  LHC also showed severe serial mid LAD lesions.     Microbiology summarized: MRSA PCR screen nonreactive.  Assessment and plan: Principal Problem:   Inferior ST segment elevation Active Problems:   CHEST PAIN   PAF (paroxysmal atrial fibrillation) (HCC)   Primary adenocarcinoma of GE junction   Diabetes mellitus, type 2 (HCC)   Coronary artery disease   New onset a-fib ([place])   Near syncope   Syncope   Non-ST elevation (NSTEMI) myocardial infarction (HCC)  Inferior STEMI/CAD: Troponin trended from 11-15,000.  EKG raises concern for inferior STEMI.  LHC with occluded posterolateral branch felt to be the culprit lesion but flow could not be restored with balloon angioplasty.  LHC also showed severe serial mid LAD lesions.  TTE with LVEF of 60 to 65%, RWMA and G2 DD.  No further chest pain or cardiopulmonary symptoms.  Still with bradycardia to 50s. -Cardiology managing -Continue DAPT (ASA and Brilinta), statin -Staged PCI of the calcified mid LAD lesion with orbital atherectomy today.  -Will continue DAPT with ASA and Brilinta.  -Hypotension and bradycardia precludes addition of BB or ARB/ACEI -N.p.o. after midnight   Sinus symptomatic bradycardia/syncope: In  the setting of the above.  Not on nodal blocking agent. -Continue telemetry monitoring -Optimize electrolytes -Defer to cardiology  Hypotension: In the setting of the above.  Resolved. -Monitor  Paroxysmal A-fib/flutter: CHA2DS2-VASc score > 3.  Currently in sinus bradycardia. -Defer anticoagulation to cardiology    AKI: Likely due to hypotension.  Improving. Recent Labs    05/12/21 0902 08/30/21 1556 09/02/21 0954 09/02/21 1527  09/10/21 0914 10/13/21 0510 10/13/21 0845 10/13/21 1327 10/14/21 0623 10/15/21 0639  BUN '18 16 18 22 23 16 15 13 11 11  '$ CREATININE 1.25 1.10 1.16 1.37* 1.35 1.87* 1.60* 1.49* 1.33* 1.28*  -Continue monitoring -Avoid nephrotoxic meds -Continue IVF   Uncontrolled NIDDM-2: A1c 7.1% on 09/02/2021. Recent Labs  Lab 10/14/21 0648 10/14/21 1154 10/14/21 1639 10/14/21 2108 10/15/21 0650  GLUCAP 89 92 96 102* 90  -Continue SSI -Continue high intensity statin  OSA on CPAP -CPAP at bedtime  Body mass index is 27.49 kg/m.           DVT prophylaxis:  enoxaparin (LOVENOX) injection 40 mg Start: 10/14/21 1400  Code Status: Full code Family Communication: Updated patient's son at bedside. Level of care: ICU Status is: Inpatient Remains inpatient appropriate because: Inferior STEMI, symptomatic sinus bradycardia with hypotension   Final disposition: Likely home once medically cleared Consultants:  Cardiology  Sch Meds:  Scheduled Meds:  [MAR Hold] aspirin  81 mg Oral Daily   [MAR Hold] atorvastatin  80 mg Oral Daily   [MAR Hold] atropine  0.5 mg Intravenous Once   [MAR Hold] Chlorhexidine Gluconate Cloth  6 each Topical Daily   [MAR Hold] enoxaparin (LOVENOX) injection  40 mg Subcutaneous Daily   [MAR Hold] fenofibrate  160 mg Oral Daily   [MAR Hold] insulin aspart  0-15 Units Subcutaneous TID WC   [MAR Hold] pantoprazole  40 mg Oral Daily   [MAR Hold] sodium chloride flush  3 mL Intravenous Q12H   sodium chloride flush  3 mL Intravenous Q12H   Continuous Infusions:  sodium chloride     [MAR Hold] sodium chloride     sodium chloride     sodium chloride 1 mL/kg/hr (10/15/21 1000)   PRN Meds:.sodium chloride, [MAR Hold] sodium chloride, sodium chloride, [MAR Hold] acetaminophen, [MAR Hold] HYDROcodone-acetaminophen, [MAR Hold] loratadine, [MAR Hold] ondansetron (ZOFRAN) IV, [MAR Hold] mouth rinse, sodium chloride flush, [MAR Hold] sodium chloride flush, sodium chloride  flush  Antimicrobials: Anti-infectives (From admission, onward)    None        I have personally reviewed the following labs and images: CBC: Recent Labs  Lab 10/13/21 0510 10/13/21 0836 10/13/21 1547 10/13/21 1614 10/13/21 1628 10/14/21 0623 10/15/21 0639  WBC 9.0  --   --   --   --  11.5* 10.3  NEUTROABS 3.6  --   --   --   --   --   --   HGB 15.7   < > 13.6 13.9 14.3 14.9 13.3  HCT 47.4   < > 40.0 41.0 42.0 43.9 39.9  MCV 88.4  --   --   --   --  87.6 87.5  PLT 362  --   --   --   --  351 303   < > = values in this interval not displayed.   BMP &GFR Recent Labs  Lab 10/13/21 0510 10/13/21 0845 10/13/21 1327 10/13/21 1547 10/13/21 1614 10/13/21 1628 10/14/21 0623 10/15/21 0639  NA 139 140 137 139 140 139 140 142  K  4.1 3.9 4.6 4.5 4.8 5.0 3.6 4.4  CL 104 106 108  --   --   --  106 108  CO2 25  --  22  --   --   --  24 26  GLUCOSE 123* 138* 123*  --   --   --  130* 103*  BUN '16 15 13  '$ --   --   --  11 11  CREATININE 1.87* 1.60* 1.49*  --   --   --  1.33* 1.28*  CALCIUM 9.9  --  8.6*  --   --   --  9.3 9.3  MG  --   --   --   --   --   --   --  1.9  PHOS  --   --   --   --   --   --   --  2.4*   Estimated Creatinine Clearance: 50.7 mL/min (A) (by C-G formula based on SCr of 1.28 mg/dL (H)). Liver & Pancreas: Recent Labs  Lab 10/13/21 0510 10/15/21 0639  AST 30  --   ALT 19  --   ALKPHOS 45  --   BILITOT 0.7  --   PROT 6.9  --   ALBUMIN 4.2 3.7   Recent Labs  Lab 10/13/21 0510  LIPASE 32   No results for input(s): "AMMONIA" in the last 168 hours. Diabetic: No results for input(s): "HGBA1C" in the last 72 hours. Recent Labs  Lab 10/14/21 0648 10/14/21 1154 10/14/21 1639 10/14/21 2108 10/15/21 0650  GLUCAP 89 92 96 102* 90   Cardiac Enzymes: No results for input(s): "CKTOTAL", "CKMB", "CKMBINDEX", "TROPONINI" in the last 168 hours. No results for input(s): "PROBNP" in the last 8760 hours. Coagulation Profile: No results for input(s):  "INR", "PROTIME" in the last 168 hours. Thyroid Function Tests: Recent Labs    10/13/21 0714  TSH 0.466   Lipid Profile: Recent Labs    10/14/21 0623  CHOL 135  HDL 42  LDLCALC 60  TRIG 164*  CHOLHDL 3.2   Anemia Panel: No results for input(s): "VITAMINB12", "FOLATE", "FERRITIN", "TIBC", "IRON", "RETICCTPCT" in the last 72 hours. Urine analysis:    Component Value Date/Time   COLORURINE YELLOW 10/13/2021 0630   APPEARANCEUR CLEAR 10/13/2021 0630   LABSPEC 1.010 10/13/2021 0630   PHURINE 7.0 10/13/2021 0630   GLUCOSEU NEGATIVE 10/13/2021 0630   GLUCOSEU NEGATIVE 06/10/2019 0955   HGBUR NEGATIVE 10/13/2021 0630   BILIRUBINUR NEGATIVE 10/13/2021 0630   KETONESUR NEGATIVE 10/13/2021 0630   PROTEINUR NEGATIVE 10/13/2021 0630   UROBILINOGEN 1.0 06/10/2019 0955   NITRITE NEGATIVE 10/13/2021 0630   LEUKOCYTESUR NEGATIVE 10/13/2021 0630   Sepsis Labs: Invalid input(s): "PROCALCITONIN", "LACTICIDVEN"  Microbiology: Recent Results (from the past 240 hour(s))  MRSA Next Gen by PCR, Nasal     Status: None   Collection Time: 10/13/21  5:03 PM   Specimen: Nasal Mucosa; Nasal Swab  Result Value Ref Range Status   MRSA by PCR Next Gen NOT DETECTED NOT DETECTED Final    Comment: (NOTE) The GeneXpert MRSA Assay (FDA approved for NASAL specimens only), is one component of a comprehensive MRSA colonization surveillance program. It is not intended to diagnose MRSA infection nor to guide or monitor treatment for MRSA infections. Test performance is not FDA approved in patients less than 74 years old. Performed at [facility], [place] [address], [place], [place] [id number]     Radiology Studies: ECHOCARDIOGRAM LIMITED  Result  Date: 10/15/2021    ECHOCARDIOGRAM LIMITED REPORT   Patient Name:   Jon [name] Date of Exam: 10/15/2021 Medical Rec #:  [id number]          Height:       70.0 in Accession #:    [id number]         Weight:       191.6 lb Date of Birth:  July 26, 1945            BSA:          2.050 m Patient Age:    41 years           BP:           108/77 mmHg Patient Gender: M                  HR:           59 bpm. Exam Location:  Inpatient Procedure: Limited Echo, Color Doppler and Cardiac Doppler Indications:    Limited to reassess for Aortic Stenosis i35.0  History:        Patient has prior history of Echocardiogram examinations, most                 recent 10/13/2021. CAD, Arrythmias:Atrial Fibrillation; Risk                 Factors:Hypertension, Diabetes, Dyslipidemia and Sleep Apnea.  Sonographer:    Raquel Sarna Senior RDCS Referring Phys: [place]  1. The aortic valve is calcified. There is severe calcifcation of the aortic valve. Aortic valve regurgitation is not visualized. Mild aortic valve stenosis. Aortic valve area, by VTI measures 1.92 cm. Aortic valve mean gradient measures 9.0 mmHg. Aortic valve Vmax measures 1.89 m/s. Conclusion(s)/Recommendation(s): Limited study to assess aortic valve stenosis. Stenosis is mild visually, by gradients, by dimensionless index, and by valve area. FINDINGS  Aortic Valve: The aortic valve is calcified. There is severe calcifcation of the aortic valve. Aortic valve regurgitation is not visualized. Mild aortic stenosis is present. Aortic valve mean gradient measures 9.0 mmHg. Aortic valve peak gradient measures 14.3 mmHg. Aortic valve area, by VTI measures 1.92 cm. LEFT VENTRICLE PLAX 2D LVOT diam:     2.00 cm LV SV:         77 LV SV Index:   38 LVOT Area:     3.14 cm  AORTIC VALVE AV Area (Vmax):    1.83 cm AV Area (Vmean):   1.95 cm AV Area (VTI):     1.92 cm AV Vmax:           189.00 cm/s AV Vmean:          140.000 cm/s AV VTI:            0.403 m AV Peak Grad:      14.3 mmHg AV Mean Grad:      9.0 mmHg LVOT Vmax:         110.00 cm/s LVOT Vmean:        86.700 cm/s LVOT VTI:          0.246 m LVOT/AV VTI ratio: 0.61  SHUNTS Systemic VTI:  0.25 m Systemic Diam: 2.00 cm Buford Dresser MD Electronically  signed by Buford Dresser MD Signature Date/Time: 10/15/2021/12:17:48 PM    Final       Janelis Stelzer T. Petal  If 7PM-7AM, please contact night-coverage [email or web address] 10/15/2021, 2:07 PM

## 2021-10-16 DIAGNOSIS — E1159 Type 2 diabetes mellitus with other circulatory complications: Secondary | ICD-10-CM | POA: Diagnosis not present

## 2021-10-16 DIAGNOSIS — R9431 Abnormal electrocardiogram [ECG] [EKG]: Secondary | ICD-10-CM | POA: Diagnosis not present

## 2021-10-16 DIAGNOSIS — I480 Paroxysmal atrial fibrillation: Secondary | ICD-10-CM | POA: Diagnosis not present

## 2021-10-16 DIAGNOSIS — I200 Unstable angina: Secondary | ICD-10-CM | POA: Diagnosis not present

## 2021-10-16 DIAGNOSIS — I25110 Atherosclerotic heart disease of native coronary artery with unstable angina pectoris: Secondary | ICD-10-CM | POA: Diagnosis not present

## 2021-10-16 LAB — GLUCOSE, CAPILLARY
Glucose-Capillary: 102 mg/dL — ABNORMAL HIGH (ref 70–99)
Glucose-Capillary: 196 mg/dL — ABNORMAL HIGH (ref 70–99)
Glucose-Capillary: 125 mg/dL — ABNORMAL HIGH (ref 70–99)
Glucose-Capillary: 107 mg/dL — ABNORMAL HIGH (ref 70–99)
Glucose-Capillary: 112 mg/dL — ABNORMAL HIGH (ref 70–99)

## 2021-10-16 LAB — RENAL FUNCTION PANEL
Albumin: 3.4 g/dL — ABNORMAL LOW (ref 3.5–5.0)
Anion gap: 11 (ref 5–15)
BUN: 12 mg/dL (ref 8–23)
CO2: 21 mmol/L — ABNORMAL LOW (ref 22–32)
Calcium: 9.1 mg/dL (ref 8.9–10.3)
Chloride: 105 mmol/L (ref 98–111)
Creatinine, Ser: 1.35 mg/dL — ABNORMAL HIGH (ref 0.61–1.24)
GFR, Estimated: 54 mL/min — ABNORMAL LOW (ref >60)
Glucose, Bld: 108 mg/dL — ABNORMAL HIGH (ref 70–99)
Phosphorus: 2.9 mg/dL (ref 2.5–4.6)
Potassium: 4 mmol/L (ref 3.5–5.1)
Sodium: 137 mmol/L (ref 135–145)

## 2021-10-16 LAB — CBC
HCT: 37.7 % — ABNORMAL LOW (ref 39.0–52.0)
Hemoglobin: 13 g/dL (ref 13.0–17.0)
MCH: 29.7 pg (ref 26.0–34.0)
MCHC: 34.5 g/dL (ref 30.0–36.0)
MCV: 86.3 fL (ref 80.0–100.0)
Platelets: 286 10*3/uL (ref 150–400)
RBC: 4.37 MIL/uL (ref 4.22–5.81)
RDW: 15.1 % (ref 11.5–15.5)
WBC: 11.2 10*3/uL — ABNORMAL HIGH (ref 4.0–10.5)
nRBC: 0 % (ref 0.0–0.2)

## 2021-10-16 LAB — HEPARIN LEVEL (UNFRACTIONATED): Heparin Unfractionated: 0.16 IU/mL — ABNORMAL LOW (ref 0.30–0.70)

## 2021-10-16 LAB — MAGNESIUM: Magnesium: 1.9 mg/dL (ref 1.7–2.4)

## 2021-10-16 MED ORDER — FENOFIBRATE 54 MG PO TABS
54.0000 mg | ORAL_TABLET | Freq: Every day | ORAL | Status: DC
  Administered 2021-10-17 – 2021-10-21 (×5): 54 mg via ORAL
  Filled 2021-10-16 (×6): qty 1

## 2021-10-16 NOTE — Progress Notes (Signed)
ANTICOAGULATION CONSULT NOTE   Pharmacy Consult for heparin Indication: atrial fibrillation  No Known Allergies  Patient Measurements: Height: '5\' 10"'$  (177.8 cm) Weight: 87.2 kg (192 lb 3.2 oz) IBW/kg (Calculated) : 73 Heparin Dosing Weight: 86.2 kg  Vital Signs: Temp: 98 F (36.7 C) (09/16 0755) Temp Source: Oral (09/16 0755) BP: 103/79 (09/16 1104) Pulse Rate: 58 (09/16 0900)  Labs: Recent Labs    10/13/21 1327 10/13/21 1547 10/13/21 1854 10/14/21 0623 10/15/21 0639 10/16/21 0557 10/16/21 0601  HGB  --    < >  --  14.9 13.3  --  13.0  HCT  --    < >  --  43.9 39.9  --  37.7*  PLT  --   --   --  351 303  --  286  HEPARINUNFRC  --   --   --   --   --  0.16*  --   CREATININE 1.49*  --   --  1.33* 1.28* 1.35*  --   TROPONINIHS 9,340*  --  15,166*  --   --   --   --    < > = values in this interval not displayed.     Estimated Creatinine Clearance: 48.1 mL/min (A) (by C-G formula based on SCr of 1.35 mg/dL (H)).   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 pylori infection 2011   Heart murmur    History of radiation therapy 09/20/10 thru 10/29/10   gastroesophageal junction/gastric cardia   Hypertension    Neuromuscular disorder ([place])    Other and unspecified hyperlipidemia    Sleep apnea    On CPAP   Status post chemotherapy 09/22/10 thru 10/27/10   concurrent w/radiation   Unspecified sleep apnea     Medications:  Infusions:   sodium chloride     sodium chloride     heparin 1,200 Units/hr (10/16/21 1106)    Assessment: 76 yo male with CAD planning CABG after ticagrelor washout LD 9/15 am Pharmacy asked to restart  heparin s/p cath for recurrent  afib Now back in SR HR 70 No overt bleeding or complications noted currently, but previously heparin was held for neck hematoma at line site CBC stable  Heparin drip 1200 uts/hr Heparin level < goal 0.16 .  Goal of Therapy:  Heparin level 0.3-0.7 units/ml Monitor platelets by anticoagulation protocol: Yes   Plan:  Increase heparin drip 1350 units/hr  Daily heparin level and CBC. Monitor s/s bleeding   Bonnita Nasuti Pharm.D. CPP, BCPS Clinical Pharmacist [phone] 10/16/2021 12:04 PM   [facility] pharmacy phone numbers are listed on [email or web address]

## 2021-10-16 NOTE — Progress Notes (Signed)
PROGRESS NOTE  DA AUTHEMENT UTM:[id number] DOB: 16-Jul-1945   PCP: [name], MD  Jon [name] is from: Home.  Lives alone.  Independently ambulates at baseline.  DOA: 10/13/2021 LOS: 3  Chief complaints Chief Complaint  Jon [name] presents with   Chest Pain   Shortness of Breath     Brief Narrative / Interim history: 76 year old M with PMH of DM-2, OSA on CPAP, paroxysmal A-fib, GEJ adenocarcinoma s/p XRT in 2012 and diverticulosis presented with new onset chest pain and near syncope associated with lightheadedness and blurry vision, and found to have inferior STEMI, bradycardia, hypotension and AKI.  Initial EKG showed atrial flutter with various transduction. Troponin trended from 11-15,000.  Subsequent EKG concerning for inferior ST elevation.  Cardiology consulted. TTE with LVEF of 60 to 65%, RWMA and G2-DD.  CT chest with significant chronic calcification.  LHC with occluded posterolateral branch felt to be the culprit lesion but flow could not be restored with balloon angioplasty.  LHC also showed severe serial mid LAD lesions that that could not be addressed by staged PCI.  Cardiothoracic surgery consulted.  Plan for CABG and ligation of LA appendage next week, Friday after Brilinta washout.  Subjective: Seen and examined earlier this afternoon.  No major events overnight of this morning.  No complaints.  Jon [name]'s son and daughter-in-law at bedside.  Objective: Vitals:   10/16/21 1200 10/16/21 1300 10/16/21 1400 10/16/21 1408  BP: 97/71 119/77 (!) 165/145 93/70  Pulse:  75 70 68  Resp: (!) '21 16 18 20  '$ Temp:      TempSrc:      SpO2:  96% 96% 90%  Weight:      Height:        Examination:  GENERAL: No apparent distress.  Nontoxic. HEENT: MMM.  Vision and hearing grossly intact.  NECK: Supple.  No apparent JVD.  Ecchymosis/bruise over right neck. RESP:  No IWOB.  Fair aeration bilaterally. CVS:  RRR. Heart sounds normal.  ABD/GI/GU: BS+. Abd soft, NTND.   MSK/EXT:  Moves extremities. No apparent deformity. No edema.  SKIN: no apparent skin lesion or wound NEURO: Awake and alert. Oriented appropriately.  No apparent focal neuro deficit. PSYCH: Calm. Normal affect.   Procedures:  9/13-LHC with occluded posterolateral branch felt to be the culprit lesion but flow could not be restored with balloon angioplasty.  LHC also showed severe serial mid LAD lesions.     Microbiology summarized: MRSA PCR screen nonreactive.  Assessment and plan: Principal Problem:   Inferior ST segment elevation Active Problems:   CHEST PAIN   PAF (paroxysmal atrial fibrillation) (HCC)   Primary adenocarcinoma of GE junction   Diabetes mellitus, type 2 (HCC)   Coronary artery disease   New onset a-fib ([place])   Near syncope   Syncope   Non-ST elevation (NSTEMI) myocardial infarction (HCC)  Inferior STEMI/CAD: Troponin trended from 11-15,000.  EKG raises concern for inferior STEMI. TTE with LVEF of 60 to 65%, RWMA and G2 DD.  CT chest with significant coronary calcification.  LHC with occluded posterolateral branch and severe serial mid LAD lesions.  PCI unsuccessful.  CVTS consulted.  Plan for CABG after Brilinta washout.  No further chest pain or cardiopulmonary symptoms. -Cardiology and CVTS following-CABG next week, Friday after  thienopyridine washout. -Continue high intensity statin, low-dose aspirin and IV heparin.  Sinus symptomatic bradycardia/syncope: In the setting of the above. Bradycardia resolved. Paroxysmal A-fib/flutter: CHA2DS2-VASc score > 3.  Currently in sinus bradycardia. -Continue telemetry monitoring -Optimize electrolytes -  CVTS planning ligation of LA appendage during CABG  Hypotension: In the setting of the above.  Resolved. -Monitor  AKI: Likely due to hypotension.  Improving. Recent Labs    08/30/21 1556 09/02/21 0954 09/02/21 1527 09/10/21 0914 10/13/21 0510 10/13/21 0845 10/13/21 1327 10/14/21 0623 10/15/21 0639  10/16/21 0557  BUN '16 18 22 23 16 15 13 11 11 12  '$ CREATININE 1.10 1.16 1.37* 1.35 1.87* 1.60* 1.49* 1.33* 1.28* 1.35*  -Continue monitoring -Avoid nephrotoxic meds   Uncontrolled NIDDM-2: A1c 7.1% on 09/02/2021. Recent Labs  Lab 10/15/21 0650 10/15/21 2148 10/16/21 0605 10/16/21 0808 10/16/21 1202  GLUCAP 90 144* 102* 196* 125*  -Continue SSI -Continue high intensity statin  OSA on CPAP -CPAP at bedtime  Body mass index is 27.58 kg/m.           DVT prophylaxis:  Jon [name] is on IV heparin  Code Status: Full code Family Communication: Updated Jon [name]'s son and daughter-in-law at bedside. Level of care: ICU Status is: Inpatient Remains inpatient appropriate because: STEMI   Final disposition: Likely home once medically cleared Consultants:  Cardiology CVTS  Sch Meds:  Scheduled Meds:  aspirin  81 mg Oral Daily   atorvastatin  80 mg Oral Daily   atropine  0.5 mg Intravenous Once   Chlorhexidine Gluconate Cloth  6 each Topical Daily   [START ON 10/17/2021] fenofibrate  54 mg Oral Daily   insulin aspart  0-15 Units Subcutaneous TID WC   pantoprazole  40 mg Oral Daily   sodium chloride flush  3 mL Intravenous Q12H   sodium chloride flush  3 mL Intravenous Q12H   Continuous Infusions:  sodium chloride     sodium chloride     heparin 1,350 Units/hr (10/16/21 1500)   PRN Meds:.sodium chloride, sodium chloride, acetaminophen, HYDROcodone-acetaminophen, loratadine, ondansetron (ZOFRAN) IV, mouth rinse, sodium chloride flush, sodium chloride flush  Antimicrobials: Anti-infectives (From admission, onward)    None        I have personally reviewed the following labs and images: CBC: Recent Labs  Lab 10/13/21 0510 10/13/21 0836 10/13/21 1614 10/13/21 1628 10/14/21 0623 10/15/21 0639 10/16/21 0601  WBC 9.0  --   --   --  11.5* 10.3 11.2*  NEUTROABS 3.6  --   --   --   --   --   --   HGB 15.7   < > 13.9 14.3 14.9 13.3 13.0  HCT 47.4   < > 41.0 42.0  43.9 39.9 37.7*  MCV 88.4  --   --   --  87.6 87.5 86.3  PLT 362  --   --   --  351 303 286   < > = values in this interval not displayed.   BMP &GFR Recent Labs  Lab 10/13/21 0510 10/13/21 0845 10/13/21 1327 10/13/21 1547 10/13/21 1614 10/13/21 1628 10/14/21 0623 10/15/21 0639 10/16/21 0557  NA 139 140 137   < > 140 139 140 142 137  K 4.1 3.9 4.6   < > 4.8 5.0 3.6 4.4 4.0  CL 104 106 108  --   --   --  106 108 105  CO2 25  --  22  --   --   --  24 26 21*  GLUCOSE 123* 138* 123*  --   --   --  130* 103* 108*  BUN '16 15 13  '$ --   --   --  '11 11 12  '$ CREATININE 1.87* 1.60* 1.49*  --   --   --  1.33* 1.28* 1.35*  CALCIUM 9.9  --  8.6*  --   --   --  9.3 9.3 9.1  MG  --   --   --   --   --   --   --  1.9 1.9  PHOS  --   --   --   --   --   --   --  2.4* 2.9   < > = values in this interval not displayed.   Estimated Creatinine Clearance: 48.1 mL/min (A) (by C-G formula based on SCr of 1.35 mg/dL (H)). Liver & Pancreas: Recent Labs  Lab 10/13/21 0510 10/15/21 0639 10/16/21 0557  AST 30  --   --   ALT 19  --   --   ALKPHOS 45  --   --   BILITOT 0.7  --   --   PROT 6.9  --   --   ALBUMIN 4.2 3.7 3.4*   Recent Labs  Lab 10/13/21 0510  LIPASE 32   No results for input(s): "AMMONIA" in the last 168 hours. Diabetic: No results for input(s): "HGBA1C" in the last 72 hours. Recent Labs  Lab 10/15/21 0650 10/15/21 2148 10/16/21 0605 10/16/21 0808 10/16/21 1202  GLUCAP 90 144* 102* 196* 125*   Cardiac Enzymes: No results for input(s): "CKTOTAL", "CKMB", "CKMBINDEX", "TROPONINI" in the last 168 hours. No results for input(s): "PROBNP" in the last 8760 hours. Coagulation Profile: No results for input(s): "INR", "PROTIME" in the last 168 hours. Thyroid Function Tests: No results for input(s): "TSH", "T4TOTAL", "FREET4", "T3FREE", "THYROIDAB" in the last 72 hours.  Lipid Profile: Recent Labs    10/14/21 0623  CHOL 135  HDL 42  LDLCALC 60  TRIG 164*  CHOLHDL 3.2    Anemia Panel: No results for input(s): "VITAMINB12", "FOLATE", "FERRITIN", "TIBC", "IRON", "RETICCTPCT" in the last 72 hours. Urine analysis:    Component Value Date/Time   COLORURINE YELLOW 10/13/2021 0630   APPEARANCEUR CLEAR 10/13/2021 0630   LABSPEC 1.010 10/13/2021 0630   PHURINE 7.0 10/13/2021 0630   GLUCOSEU NEGATIVE 10/13/2021 0630   GLUCOSEU NEGATIVE 06/10/2019 0955   HGBUR NEGATIVE 10/13/2021 0630   BILIRUBINUR NEGATIVE 10/13/2021 0630   KETONESUR NEGATIVE 10/13/2021 0630   PROTEINUR NEGATIVE 10/13/2021 0630   UROBILINOGEN 1.0 06/10/2019 0955   NITRITE NEGATIVE 10/13/2021 0630   LEUKOCYTESUR NEGATIVE 10/13/2021 0630   Sepsis Labs: Invalid input(s): "PROCALCITONIN", "LACTICIDVEN"  Microbiology: Recent Results (from the past 240 hour(s))  MRSA Next Gen by PCR, Nasal     Status: None   Collection Time: 10/13/21  5:03 PM   Specimen: Nasal Mucosa; Nasal Swab  Result Value Ref Range Status   MRSA by PCR Next Gen NOT DETECTED NOT DETECTED Final    Comment: (NOTE) The GeneXpert MRSA Assay (FDA approved for NASAL specimens only), is one component of a comprehensive MRSA colonization surveillance program. It is not intended to diagnose MRSA infection nor to guide or monitor treatment for MRSA infections. Test performance is not FDA approved in patients less than 68 years old. Performed at [facility], [place] [address] [place], [place] [address]     Radiology Studies: CT chest w/o contrast  Result Date: 10/15/2021 CLINICAL DATA:  Evaluate for aortic atherosclerotic change as a prelude to upcoming coronary bypass grafting EXAM: CT CHEST WITHOUT CONTRAST TECHNIQUE: Multidetector CT imaging of the chest was performed following the standard protocol without IV contrast. RADIATION DOSE REDUCTION: This exam was performed according to the departmental  dose-optimization program which includes automated exposure control, adjustment of the mA and/or kV according to Jon [name]  size and/or use of iterative reconstruction technique. COMPARISON:  10/13/2021 FINDINGS: Cardiovascular: Somewhat limited due to lack of IV contrast. Heart is not significantly enlarged. Diffuse coronary calcifications are noted. Mild calcifications in the aortic valve are noted. Scattered aortic calcifications are seen predominately in the region of the aortic arch. No significant ascending aortic calcifications. Pulmonary artery as visualized is within normal limits. Mediastinum/Nodes: Thoracic inlet is within normal limits. No hilar or mediastinal adenopathy is noted. The esophagus is within normal limits. Lungs/Pleura: Lungs are well aerated bilaterally. Linear scarring is noted in the left base. No focal infiltrate or sizable effusion is seen. Parenchymal nodules are noted. Upper Abdomen: Visualized upper abdomen is unremarkable. Musculoskeletal: Degenerative changes of the thoracic spine are seen. No rib abnormality is noted. IMPRESSION: Mild aortic atherosclerotic calcifications centered primarily within the aortic arch. Heavy coronary calcifications consistent with the given clinical history. Scarring in the left lung base. Aortic Atherosclerosis (ICD10-I70.0). Electronically Signed   By: Inez Catalina M.D.   On: 10/15/2021 20:48      [facility]  If 7PM-7AM, please contact night-coverage [email or web address] 10/16/2021, 3:44 PM

## 2021-10-16 NOTE — Progress Notes (Signed)
      [address]       [place] [address]             [phone]      1 Day Post-Op  Procedure(s) (LRB): LEFT HEART CATH AND CORONARY ANGIOGRAPHY (N/A) CORONARY BALLOON ANGIOPLASTY (N/A)   Total Length of Stay:  LOS: 3 days    SUBJECTIVE: Comfortable without CP  Vitals:   10/16/21 0600 10/16/21 0700  BP: (!) 87/65 (!) 95/57  Pulse: 62 65  Resp: 18 (!) 23  Temp:    SpO2: 96% 97%    Intake/Output      09/15 0701 09/16 0700 09/16 0701 09/17 0700   P.O.     I.V. (mL/kg) 621.8 (7.1)    Total Intake(mL/kg) 621.8 (7.1)    Urine (mL/kg/hr) 3000 (1.4)    Total Output 3000    Net -2378.2             sodium chloride     sodium chloride     heparin Stopped (10/16/21 0548)    CBC    Component Value Date/Time   WBC 11.2 (H) 10/16/2021 0601   RBC 4.37 10/16/2021 0601   HGB 13.0 10/16/2021 0601   HGB 15.8 03/25/2014 1049   HCT 37.7 (L) 10/16/2021 0601   HCT 48.6 03/25/2014 1049   PLT 286 10/16/2021 0601   PLT 332 03/25/2014 1049   MCV 86.3 10/16/2021 0601   MCV 85.9 03/25/2014 1049   MCH 29.7 10/16/2021 0601   MCHC 34.5 10/16/2021 0601   RDW 15.1 10/16/2021 0601   RDW 15.0 (H) 03/25/2014 1049   LYMPHSABS 3.8 10/13/2021 0510   LYMPHSABS 2.0 03/25/2014 1049   MONOABS 1.0 10/13/2021 0510   MONOABS 0.8 03/25/2014 1049   EOSABS 0.4 10/13/2021 0510   EOSABS 0.2 03/25/2014 1049   BASOSABS 0.1 10/13/2021 0510   BASOSABS 0.0 03/25/2014 1049   CMP     Component Value Date/Time   NA 137 10/16/2021 0557   K 4.0 10/16/2021 0557   CL 105 10/16/2021 0557   CO2 21 (L) 10/16/2021 0557   GLUCOSE 108 (H) 10/16/2021 0557   BUN 12 10/16/2021 0557   CREATININE 1.35 (H) 10/16/2021 0557   CREATININE 1.20 02/15/2011 0846   CALCIUM 9.1 10/16/2021 0557   PROT 6.9 10/13/2021 0510   ALBUMIN 3.4 (L) 10/16/2021 0557   AST 30 10/13/2021 0510   ALT 19 10/13/2021 0510   ALKPHOS 45 10/13/2021 0510   BILITOT 0.7 10/13/2021 0510   GFRNONAA 54 (L) 10/16/2021  0557   GFRAA 52 (L) 11/23/2015 2118   ABG    Component Value Date/Time   PHART 7.276 (L) 10/13/2021 1614   PCO2ART 48.3 (H) 10/13/2021 1614   PO2ART 46 (L) 10/13/2021 1614   HCO3 19.2 (L) 10/13/2021 1628   TCO2 20 (L) 10/13/2021 1628   ACIDBASEDEF 6.0 (H) 10/13/2021 1628   O2SAT 96 10/13/2021 1628   CBG (last 3)  Recent Labs    10/14/21 2108 10/15/21 0650 10/15/21 2148  GLUCAP 102* 90 144*     ASSESSMENT: STEMI with incomplete PCI and severe RCA/LAD disease Stable,no afib overnight Awaiting Brillinta washout CT scan without significant findings On for CABG ligation of LA appendage next friday  Coralie Common, MD '@DATE'$ @

## 2021-10-16 NOTE — Progress Notes (Signed)
Pt not ready for CPAP at this time. PT states that he will place on himself when ready to wear.

## 2021-10-16 NOTE — H&P (View-Only) (Signed)
      [address]       [place] [address]             [phone]      1 Day Post-Op  Procedure(s) (LRB): LEFT HEART CATH AND CORONARY ANGIOGRAPHY (N/A) CORONARY BALLOON ANGIOPLASTY (N/A)   Total Length of Stay:  LOS: 3 days    SUBJECTIVE: Comfortable without CP  Vitals:   10/16/21 0600 10/16/21 0700  BP: (!) 87/65 (!) 95/57  Pulse: 62 65  Resp: 18 (!) 23  Temp:    SpO2: 96% 97%    Intake/Output      09/15 0701 09/16 0700 09/16 0701 09/17 0700   P.O.     I.V. (mL/kg) 621.8 (7.1)    Total Intake(mL/kg) 621.8 (7.1)    Urine (mL/kg/hr) 3000 (1.4)    Total Output 3000    Net -2378.2             sodium chloride     sodium chloride     heparin Stopped (10/16/21 0548)    CBC    Component Value Date/Time   WBC 11.2 (H) 10/16/2021 0601   RBC 4.37 10/16/2021 0601   HGB 13.0 10/16/2021 0601   HGB 15.8 03/25/2014 1049   HCT 37.7 (L) 10/16/2021 0601   HCT 48.6 03/25/2014 1049   PLT 286 10/16/2021 0601   PLT 332 03/25/2014 1049   MCV 86.3 10/16/2021 0601   MCV 85.9 03/25/2014 1049   MCH 29.7 10/16/2021 0601   MCHC 34.5 10/16/2021 0601   RDW 15.1 10/16/2021 0601   RDW 15.0 (H) 03/25/2014 1049   LYMPHSABS 3.8 10/13/2021 0510   LYMPHSABS 2.0 03/25/2014 1049   MONOABS 1.0 10/13/2021 0510   MONOABS 0.8 03/25/2014 1049   EOSABS 0.4 10/13/2021 0510   EOSABS 0.2 03/25/2014 1049   BASOSABS 0.1 10/13/2021 0510   BASOSABS 0.0 03/25/2014 1049   CMP     Component Value Date/Time   NA 137 10/16/2021 0557   K 4.0 10/16/2021 0557   CL 105 10/16/2021 0557   CO2 21 (L) 10/16/2021 0557   GLUCOSE 108 (H) 10/16/2021 0557   BUN 12 10/16/2021 0557   CREATININE 1.35 (H) 10/16/2021 0557   CREATININE 1.20 02/15/2011 0846   CALCIUM 9.1 10/16/2021 0557   PROT 6.9 10/13/2021 0510   ALBUMIN 3.4 (L) 10/16/2021 0557   AST 30 10/13/2021 0510   ALT 19 10/13/2021 0510   ALKPHOS 45 10/13/2021 0510   BILITOT 0.7 10/13/2021 0510   GFRNONAA 54 (L) 10/16/2021  0557   GFRAA 52 (L) 11/23/2015 2118   ABG    Component Value Date/Time   PHART 7.276 (L) 10/13/2021 1614   PCO2ART 48.3 (H) 10/13/2021 1614   PO2ART 46 (L) 10/13/2021 1614   HCO3 19.2 (L) 10/13/2021 1628   TCO2 20 (L) 10/13/2021 1628   ACIDBASEDEF 6.0 (H) 10/13/2021 1628   O2SAT 96 10/13/2021 1628   CBG (last 3)  Recent Labs    10/14/21 2108 10/15/21 0650 10/15/21 2148  GLUCAP 102* 90 144*     ASSESSMENT: STEMI with incomplete PCI and severe RCA/LAD disease Stable,no afib overnight Awaiting Brillinta washout CT scan without significant findings On for CABG ligation of LA appendage next friday  Coralie Common, MD '@DATE'$ @

## 2021-10-16 NOTE — Progress Notes (Signed)
Patient resting well on CPAP. Patient placed self on CPAP.  RT will continue to monitor.

## 2021-10-16 NOTE — Progress Notes (Signed)
Rounding Note    Patient Name: Jon [name] Date of Encounter: 10/16/2021  [place] Cardiologist: Harrington Challenger (2014)  Subjective   Denies angina or dyspnea.  Blood pressure running borderline low.  Inpatient Medications    Scheduled Meds:  aspirin  81 mg Oral Daily   atorvastatin  80 mg Oral Daily   atropine  0.5 mg Intravenous Once   Chlorhexidine Gluconate Cloth  6 each Topical Daily   fenofibrate  160 mg Oral Daily   insulin aspart  0-15 Units Subcutaneous TID WC   pantoprazole  40 mg Oral Daily   sodium chloride flush  3 mL Intravenous Q12H   sodium chloride flush  3 mL Intravenous Q12H   Continuous Infusions:  sodium chloride     sodium chloride     heparin 1,200 Units/hr (10/16/21 0900)   PRN Meds: sodium chloride, sodium chloride, acetaminophen, HYDROcodone-acetaminophen, loratadine, ondansetron (ZOFRAN) IV, mouth rinse, sodium chloride flush, sodium chloride flush   Vital Signs    Vitals:   10/16/21 0700 10/16/21 0755 10/16/21 0800 10/16/21 0900  BP: (!) 95/57  (!) 87/70 106/77  Pulse: 65  69 (!) 58  Resp: (!) 23  (!) 23 17  Temp:  98 F (36.7 C)    TempSrc:  Oral    SpO2: 97%  97% 97%  Weight: 87.2 kg     Height:        Intake/Output Summary (Last 24 hours) at 10/16/2021 0919 Last data filed at 10/16/2021 0900 Gross per 24 hour  Intake 444.09 ml  Output 2600 ml  Net -2155.91 ml      10/16/2021    7:00 AM 10/15/2021    6:00 AM 10/14/2021    5:00 AM  Last 3 Weights  Weight (lbs) 192 lb 3.2 oz 191 lb 9.3 oz 196 lb 3.4 oz  Weight (kg) 87.181 kg 86.9 kg 89 kg      Telemetry    Sinus rhythm/sinus bradycardia- Personally Reviewed  ECG    ECG shows normal sinus rhythm, tall R wave in lead V2 suggestive of posterior infarction, no acute ST-T segment changes.- Personally Reviewed  Physical Exam  Looks very comfortable GEN: No acute distress.   Neck: No JVD. Large ecchymosis R neck Cardiac: RRR, no murmurs, rubs, or gallops.   Respiratory: Clear to auscultation bilaterally. GI: Soft, nontender, non-distended  MS: No edema; No deformity. Neuro:  Nonfocal  Psych: Normal affect   Labs    High Sensitivity Troponin:   Recent Labs  Lab 10/13/21 0714 10/13/21 0836 10/13/21 1150 10/13/21 1327 10/13/21 1854  TROPONINIHS 397* 1,101* 9,868* 9,340* 15,166*     Chemistry Recent Labs  Lab 10/13/21 0510 10/13/21 0845 10/14/21 0623 10/15/21 0639 10/16/21 0557  NA 139   < > 140 142 137  K 4.1   < > 3.6 4.4 4.0  CL 104   < > 106 108 105  CO2 25   < > 24 26 21*  GLUCOSE 123*   < > 130* 103* 108*  BUN 16   < > '11 11 12  '$ CREATININE 1.87*   < > 1.33* 1.28* 1.35*  CALCIUM 9.9   < > 9.3 9.3 9.1  MG  --   --   --  1.9 1.9  PROT 6.9  --   --   --   --   ALBUMIN 4.2  --   --  3.7 3.4*  AST 30  --   --   --   --  ALT 19  --   --   --   --   ALKPHOS 45  --   --   --   --   BILITOT 0.7  --   --   --   --   GFRNONAA 37*   < > 55* 58* 54*  ANIONGAP 10   < > '10 8 11   '$ < > = values in this interval not displayed.    Lipids  Recent Labs  Lab 10/14/21 0623  CHOL 135  TRIG 164*  HDL 42  LDLCALC 60  CHOLHDL 3.2    Hematology Recent Labs  Lab 10/14/21 0623 10/15/21 0639 10/16/21 0601  WBC 11.5* 10.3 11.2*  RBC 5.01 4.56 4.37  HGB 14.9 13.3 13.0  HCT 43.9 39.9 37.7*  MCV 87.6 87.5 86.3  MCH 29.7 29.2 29.7  MCHC 33.9 33.3 34.5  RDW 15.3 14.9 15.1  PLT 351 303 286   Thyroid  Recent Labs  Lab 10/13/21 0714  TSH 0.466    BNPNo results for input(s): "BNP", "PROBNP" in the last 168 hours.  DDimer No results for input(s): "DDIMER" in the last 168 hours.   Radiology    CT chest w/o contrast  Result Date: 10/15/2021 CLINICAL DATA:  Evaluate for aortic atherosclerotic change as a prelude to upcoming coronary bypass grafting EXAM: CT CHEST WITHOUT CONTRAST TECHNIQUE: Multidetector CT imaging of the chest was performed following the standard protocol without IV contrast. RADIATION DOSE REDUCTION: This  exam was performed according to the departmental dose-optimization program which includes automated exposure control, adjustment of the mA and/or kV according to patient size and/or use of iterative reconstruction technique. COMPARISON:  10/13/2021 FINDINGS: Cardiovascular: Somewhat limited due to lack of IV contrast. Heart is not significantly enlarged. Diffuse coronary calcifications are noted. Mild calcifications in the aortic valve are noted. Scattered aortic calcifications are seen predominately in the region of the aortic arch. No significant ascending aortic calcifications. Pulmonary artery as visualized is within normal limits. Mediastinum/Nodes: Thoracic inlet is within normal limits. No hilar or mediastinal adenopathy is noted. The esophagus is within normal limits. Lungs/Pleura: Lungs are well aerated bilaterally. Linear scarring is noted in the left base. No focal infiltrate or sizable effusion is seen. Parenchymal nodules are noted. Upper Abdomen: Visualized upper abdomen is unremarkable. Musculoskeletal: Degenerative changes of the thoracic spine are seen. No rib abnormality is noted. IMPRESSION: Mild aortic atherosclerotic calcifications centered primarily within the aortic arch. Heavy coronary calcifications consistent with the given clinical history. Scarring in the left lung base. Aortic Atherosclerosis (ICD10-I70.0). Electronically Signed   By: Inez Catalina M.D.   On: 10/15/2021 20:48   CARDIAC CATHETERIZATION  Result Date: 10/15/2021   Prox LAD to Mid LAD lesion is 90% stenosed.   Mid LAD lesion is 80% stenosed.   Prox RCA lesion is 50% stenosed.   RPAV lesion is 95% stenosed.   RPDA lesion is 60% stenosed. 1.  Heavily calcified and acutely angulated proximal LAD lesion treated with balloon angioplasty only; the lesion could not be crossed for atherectomy (see procedural details).  2.  Recanalized RPLV branch. 3.  LVEDP of 9 mmHg. Recommendation: After review with team see attending Dr.  Angelena Form, further attempts at PCI will be deferred and the patient will be referred for consideration for coronary artery bypass grafting and possible aortic valve replacement given how heavily calcified aortic valve on echocardiogram.  The patient's Brilinta will be discontinued and a heparin drip initiated.   ECHOCARDIOGRAM LIMITED  Result  Date: October 22, 2021    ECHOCARDIOGRAM LIMITED REPORT   Patient Name:   Jon [name] Date of Exam: 10-22-21 Medical Rec #:  [id number]          Height:       70.0 in Accession #:    [id number]         Weight:       191.6 lb Date of Birth:  07/15/1945           BSA:          2.050 m Patient Age:    65 years           BP:           108/77 mmHg Patient Gender: M                  HR:           59 bpm. Exam Location:  Inpatient Procedure: Limited Echo, Color Doppler and Cardiac Doppler Indications:    Limited to reassess for Aortic Stenosis i35.0  History:        Patient has prior history of Echocardiogram examinations, most                 recent 10/13/2021. CAD, Arrythmias:Atrial Fibrillation; Risk                 Factors:Hypertension, Diabetes, Dyslipidemia and Sleep Apnea.  Sonographer:    Raquel Sarna Senior RDCS Referring Phys: [place]  1. The aortic valve is calcified. There is severe calcifcation of the aortic valve. Aortic valve regurgitation is not visualized. Mild aortic valve stenosis. Aortic valve area, by VTI measures 1.92 cm. Aortic valve mean gradient measures 9.0 mmHg. Aortic valve Vmax measures 1.89 m/s. Conclusion(s)/Recommendation(s): Limited study to assess aortic valve stenosis. Stenosis is mild visually, by gradients, by dimensionless index, and by valve area. FINDINGS  Aortic Valve: The aortic valve is calcified. There is severe calcifcation of the aortic valve. Aortic valve regurgitation is not visualized. Mild aortic stenosis is present. Aortic valve mean gradient measures 9.0 mmHg. Aortic valve peak gradient measures 14.3 mmHg.  Aortic valve area, by VTI measures 1.92 cm. LEFT VENTRICLE PLAX 2D LVOT diam:     2.00 cm LV SV:         77 LV SV Index:   38 LVOT Area:     3.14 cm  AORTIC VALVE AV Area (Vmax):    1.83 cm AV Area (Vmean):   1.95 cm AV Area (VTI):     1.92 cm AV Vmax:           189.00 cm/s AV Vmean:          140.000 cm/s AV VTI:            0.403 m AV Peak Grad:      14.3 mmHg AV Mean Grad:      9.0 mmHg LVOT Vmax:         110.00 cm/s LVOT Vmean:        86.700 cm/s LVOT VTI:          0.246 m LVOT/AV VTI ratio: 0.61  SHUNTS Systemic VTI:  0.25 m Systemic Diam: 2.00 cm Buford Dresser MD Electronically signed by Buford Dresser MD Signature Date/Time: October 22, 2021/12:17:48 PM    Final     Cardiac Studies   Limited echocardiogram 10-22-21 1. The aortic valve is calcified. There is severe calcifcation of the aortic valve. Aortic valve regurgitation is not visualized. Mild  aortic valve stenosis. Aortic valve area, by VTI measures 1.92 cm. Aortic valve mean gradient measures 9.0 mmHg. Aortic valve Vmax measures 1.89 m/s. Conclusion(s)/Recommendation(s): Limited study to assess aortic valve stenosis. Stenosis is mild visually, by gradients, by dimensionless index, and by valve area.   Echocardiogram 10/13/2021  1. Left ventricular ejection fraction, by estimation, is 60 to 65%. The  left ventricle has normal function. The left ventricle demonstrates  regional wall motion abnormalities with basal inferior and basal  inferolateral severe hypokinesis. There is mild  concentric left ventricular hypertrophy. Left ventricular diastolic  parameters are consistent with Grade II diastolic dysfunction  (pseudonormalization).   2. Right ventricular systolic function is normal. The right ventricular  size is normal. Tricuspid regurgitation signal is inadequate for assessing  PA pressure.   3. The mitral valve is normal in structure. Mild mitral valve  regurgitation. No evidence of mitral stenosis.   4. The aortic  valve is tricuspid. There is severe calcification of the  aortic valve. Aortic valve regurgitation is not visualized. There appears  to be some degree of aortic stenosis but the aortic valve was not fully  interrogated by doppler. Would  consider repeat limited echo with attention to aortic valve doppler  interrogation.   5. The inferior vena cava is normal in size with greater than 50%  respiratory variability, suggesting right atrial pressure of 3 mmHg.   Cardiac catheterization 10/15/2021 Diagnostic Dominance: Right   Patient Profile     76 y.o. male with widespread CAD, presenting with inferior STEMI underwent incomplete PCI to RPLV (09/13), also unable to perform PCI for calcified and highly angulated proximal LAD 90% stenosis (09/15), also has 80% mid LAD stenosis, 50% mid-RCA stenosis, with plan for CABG after thienopyridine washout later next week.  Additional medical problems include paroxysmal atrial fibrillation, HTN, DM2, OSA, mild aortic stenosis  Assessment & Plan    CAD: Heavily calcified vessel with unsuccessful PCI to RPLV and LAD, now with plan for multivessel CABG next week after thienopyridine washout.  Currently asymptomatic.  Medical therapy limited by hypotension.  On high-dose statin. PAFib: Dates back at least to 2007, infrequent events. Currently in sinus rhythm.  Plan for ligation of the left atrial appendage at the time of CABG. DM: Chronically would benefit from treatment with a GLP-1 agonist and/or SGLT2 inhibitors for best cardiovascular outcomes.  Most recent hemoglobin A1c 7.1% AS: Mild.  Probably does not require AVR at this time.  Can have TAVR down the road if necessary. HLP: Currently on high-dose atorvastatin 80 mg daily.  We will decrease the dose of fenofibrate.     For questions or updates, please contact [place] Please consult [email or web address] for contact info under        Signed, Sanda Klein, MD  10/16/2021, 9:19 AM

## 2021-10-17 DIAGNOSIS — I480 Paroxysmal atrial fibrillation: Secondary | ICD-10-CM | POA: Diagnosis not present

## 2021-10-17 DIAGNOSIS — I200 Unstable angina: Secondary | ICD-10-CM | POA: Diagnosis not present

## 2021-10-17 DIAGNOSIS — R9431 Abnormal electrocardiogram [ECG] [EKG]: Secondary | ICD-10-CM | POA: Diagnosis not present

## 2021-10-17 DIAGNOSIS — E1159 Type 2 diabetes mellitus with other circulatory complications: Secondary | ICD-10-CM | POA: Diagnosis not present

## 2021-10-17 LAB — GLUCOSE, CAPILLARY
Glucose-Capillary: 109 mg/dL — ABNORMAL HIGH (ref 70–99)
Glucose-Capillary: 147 mg/dL — ABNORMAL HIGH (ref 70–99)
Glucose-Capillary: 127 mg/dL — ABNORMAL HIGH (ref 70–99)

## 2021-10-17 LAB — RENAL FUNCTION PANEL
Albumin: 3.6 g/dL (ref 3.5–5.0)
Anion gap: 8 (ref 5–15)
BUN: 12 mg/dL (ref 8–23)
CO2: 23 mmol/L (ref 22–32)
Calcium: 9.2 mg/dL (ref 8.9–10.3)
Chloride: 105 mmol/L (ref 98–111)
Creatinine, Ser: 1.41 mg/dL — ABNORMAL HIGH (ref 0.61–1.24)
GFR, Estimated: 52 mL/min — ABNORMAL LOW (ref >60)
Glucose, Bld: 181 mg/dL — ABNORMAL HIGH (ref 70–99)
Phosphorus: 3.3 mg/dL (ref 2.5–4.6)
Potassium: 3.8 mmol/L (ref 3.5–5.1)
Sodium: 136 mmol/L (ref 135–145)

## 2021-10-17 LAB — CBC
HCT: 39 % (ref 39.0–52.0)
Hemoglobin: 13.4 g/dL (ref 13.0–17.0)
MCH: 29.5 pg (ref 26.0–34.0)
MCHC: 34.4 g/dL (ref 30.0–36.0)
MCV: 85.7 fL (ref 80.0–100.0)
Platelets: 339 10*3/uL (ref 150–400)
RBC: 4.55 MIL/uL (ref 4.22–5.81)
RDW: 14.8 % (ref 11.5–15.5)
WBC: 8.4 10*3/uL (ref 4.0–10.5)
nRBC: 0 % (ref 0.0–0.2)

## 2021-10-17 LAB — HEPARIN LEVEL (UNFRACTIONATED): Heparin Unfractionated: 0.39 IU/mL (ref 0.30–0.70)

## 2021-10-17 LAB — MAGNESIUM: Magnesium: 1.9 mg/dL (ref 1.7–2.4)

## 2021-10-17 NOTE — Progress Notes (Signed)
Rounding Note    Patient Name: Jon [name] Date of Encounter: 10/17/2021  [facility] Cardiologist: Harrington Challenger 2014)  Subjective   Denies angina, no dyspnea, reportedly had some enlargement of the right neck hematoma at the previous IJ catheter site, but no active bleeding.  Site looks similar to yesterday to me. Blood pressure staying in borderline low range, but asymptomatic  Inpatient Medications    Scheduled Meds:  aspirin  81 mg Oral Daily   atorvastatin  80 mg Oral Daily   atropine  0.5 mg Intravenous Once   Chlorhexidine Gluconate Cloth  6 each Topical Daily   fenofibrate  54 mg Oral Daily   insulin aspart  0-15 Units Subcutaneous TID WC   pantoprazole  40 mg Oral Daily   sodium chloride flush  3 mL Intravenous Q12H   sodium chloride flush  3 mL Intravenous Q12H   Continuous Infusions:  sodium chloride     sodium chloride     heparin 1,350 Units/hr (10/17/21 0900)   PRN Meds: sodium chloride, sodium chloride, acetaminophen, HYDROcodone-acetaminophen, loratadine, ondansetron (ZOFRAN) IV, mouth rinse, sodium chloride flush, sodium chloride flush   Vital Signs    Vitals:   10/17/21 0830 10/17/21 0845 10/17/21 0900 10/17/21 0915  BP:   (!) 87/69   Pulse: (!) 59 73 63 68  Resp: 18 (!) '21 17 16  '$ Temp:      TempSrc:      SpO2: 95% 97% 95% 98%  Weight:      Height:        Intake/Output Summary (Last 24 hours) at 10/17/2021 0941 Last data filed at 10/17/2021 0900 Gross per 24 hour  Intake 1859.63 ml  Output 2975 ml  Net -1115.37 ml      10/17/2021    5:30 AM 10/16/2021    7:00 AM 10/15/2021    6:00 AM  Last 3 Weights  Weight (lbs) 190 lb 14.7 oz 192 lb 3.2 oz 191 lb 9.3 oz  Weight (kg) 86.6 kg 87.181 kg 86.9 kg      Telemetry    Normal sinus rhythm- Personally Reviewed  ECG    No new tracing- Personally Reviewed  Physical Exam  Appears comfortable, smiling.  Hematoma/ecchymosis right neck GEN: No acute distress.   Neck: No  JVD Cardiac: RRR, 1/6 early peaking aortic ejection murmur, no diastolic murmurs, rubs, or gallops.  Respiratory: Clear to auscultation bilaterally. GI: Soft, nontender, non-distended  MS: No edema; No deformity. Neuro:  Nonfocal  Psych: Normal affect   Labs    High Sensitivity Troponin:   Recent Labs  Lab 10/13/21 0714 10/13/21 0836 10/13/21 1150 10/13/21 1327 10/13/21 1854  TROPONINIHS 397* 1,101* 9,868* 9,340* 15,166*     Chemistry Recent Labs  Lab 10/13/21 0510 10/13/21 0845 10/15/21 0639 10/16/21 0557 10/17/21 0755  NA 139   < > 142 137 136  K 4.1   < > 4.4 4.0 3.8  CL 104   < > 108 105 105  CO2 25   < > 26 21* 23  GLUCOSE 123*   < > 103* 108* 181*  BUN 16   < > '11 12 12  '$ CREATININE 1.87*   < > 1.28* 1.35* 1.41*  CALCIUM 9.9   < > 9.3 9.1 9.2  MG  --   --  1.9 1.9 1.9  PROT 6.9  --   --   --   --   ALBUMIN 4.2  --  3.7 3.4* 3.6  AST  30  --   --   --   --   ALT 19  --   --   --   --   ALKPHOS 45  --   --   --   --   BILITOT 0.7  --   --   --   --   GFRNONAA 37*   < > 58* 54* 52*  ANIONGAP 10   < > '8 11 8   '$ < > = values in this interval not displayed.    Lipids  Recent Labs  Lab 10/14/21 0623  CHOL 135  TRIG 164*  HDL 42  LDLCALC 60  CHOLHDL 3.2    Hematology Recent Labs  Lab 10/15/21 0639 10/16/21 0601 10/17/21 0755  WBC 10.3 11.2* 8.4  RBC 4.56 4.37 4.55  HGB 13.3 13.0 13.4  HCT 39.9 37.7* 39.0  MCV 87.5 86.3 85.7  MCH 29.2 29.7 29.5  MCHC 33.3 34.5 34.4  RDW 14.9 15.1 14.8  PLT 303 286 339   Thyroid  Recent Labs  Lab 10/13/21 0714  TSH 0.466    BNPNo results for input(s): "BNP", "PROBNP" in the last 168 hours.  DDimer No results for input(s): "DDIMER" in the last 168 hours.   Radiology    CT chest w/o contrast  Result Date: 10/15/2021 CLINICAL DATA:  Evaluate for aortic atherosclerotic change as a prelude to upcoming coronary bypass grafting EXAM: CT CHEST WITHOUT CONTRAST TECHNIQUE: Multidetector CT imaging of the chest was  performed following the standard protocol without IV contrast. RADIATION DOSE REDUCTION: This exam was performed according to the departmental dose-optimization program which includes automated exposure control, adjustment of the mA and/or kV according to patient size and/or use of iterative reconstruction technique. COMPARISON:  10/13/2021 FINDINGS: Cardiovascular: Somewhat limited due to lack of IV contrast. Heart is not significantly enlarged. Diffuse coronary calcifications are noted. Mild calcifications in the aortic valve are noted. Scattered aortic calcifications are seen predominately in the region of the aortic arch. No significant ascending aortic calcifications. Pulmonary artery as visualized is within normal limits. Mediastinum/Nodes: Thoracic inlet is within normal limits. No hilar or mediastinal adenopathy is noted. The esophagus is within normal limits. Lungs/Pleura: Lungs are well aerated bilaterally. Linear scarring is noted in the left base. No focal infiltrate or sizable effusion is seen. Parenchymal nodules are noted. Upper Abdomen: Visualized upper abdomen is unremarkable. Musculoskeletal: Degenerative changes of the thoracic spine are seen. No rib abnormality is noted. IMPRESSION: Mild aortic atherosclerotic calcifications centered primarily within the aortic arch. Heavy coronary calcifications consistent with the given clinical history. Scarring in the left lung base. Aortic Atherosclerosis (ICD10-I70.0). Electronically Signed   By: Inez Catalina M.D.   On: 10/15/2021 20:48   CARDIAC CATHETERIZATION  Result Date: 10/15/2021   Prox LAD to Mid LAD lesion is 90% stenosed.   Mid LAD lesion is 80% stenosed.   Prox RCA lesion is 50% stenosed.   RPAV lesion is 95% stenosed.   RPDA lesion is 60% stenosed. 1.  Heavily calcified and acutely angulated proximal LAD lesion treated with balloon angioplasty only; the lesion could not be crossed for atherectomy (see procedural details).  2.  Recanalized  RPLV branch. 3.  LVEDP of 9 mmHg. Recommendation: After review with team see attending Dr. Angelena Form, further attempts at PCI will be deferred and the patient will be referred for consideration for coronary artery bypass grafting and possible aortic valve replacement given how heavily calcified aortic valve on echocardiogram.  The patient's Brilinta  will be discontinued and a heparin drip initiated.    Cardiac Studies   Limited echocardiogram 17-Oct-2021 1. The aortic valve is calcified. There is severe calcifcation of the aortic valve. Aortic valve regurgitation is not visualized. Mild aortic valve stenosis. Aortic valve area, by VTI measures 1.92 cm. Aortic valve mean gradient measures 9.0 mmHg. Aortic valve Vmax measures 1.89 m/s. Conclusion(s)/Recommendation(s): Limited study to assess aortic valve stenosis. Stenosis is mild visually, by gradients, by dimensionless index, and by valve area.    Echocardiogram 10/13/2021  1. Left ventricular ejection fraction, by estimation, is 60 to 65%. The  left ventricle has normal function. The left ventricle demonstrates  regional wall motion abnormalities with basal inferior and basal  inferolateral severe hypokinesis. There is mild  concentric left ventricular hypertrophy. Left ventricular diastolic  parameters are consistent with Grade II diastolic dysfunction  (pseudonormalization).   2. Right ventricular systolic function is normal. The right ventricular  size is normal. Tricuspid regurgitation signal is inadequate for assessing  PA pressure.   3. The mitral valve is normal in structure. Mild mitral valve  regurgitation. No evidence of mitral stenosis.   4. The aortic valve is tricuspid. There is severe calcification of the  aortic valve. Aortic valve regurgitation is not visualized. There appears  to be some degree of aortic stenosis but the aortic valve was not fully  interrogated by doppler. Would  consider repeat limited echo with attention to  aortic valve doppler  interrogation.   5. The inferior vena cava is normal in size with greater than 50%  respiratory variability, suggesting right atrial pressure of 3 mmHg.    Cardiac catheterization 2021-10-17 Diagnostic Dominance: Right   Patient Profile     76 y.o. male with multivessel CAD, presenting with inferior STEMI underwent incomplete PCI to RPLV (09/13), also unable to perform PCI for calcified and highly angulated proximal LAD 90% stenosis 18-Oct-2022), also has 80% mid LAD stenosis, 50% mid-RCA stenosis, with plan for CABG after thienopyridine washout later next week.  Additional medical problems include paroxysmal atrial fibrillation, HTN, DM2, OSA, mild aortic stenosis  Assessment & Plan    CAD: Heavily calcified vessel with unsuccessful PCI to RPLV and LAD, now with plan for multivessel CABG next week after thienopyridine washout.  Currently asymptomatic.  Medical therapy limited by hypotension.  On high-dose statin. PAFib: Dates back at least to 2007, infrequent events. Currently in sinus rhythm.  Plan for ligation of the left atrial appendage at the time of CABG. DM: Chronically would benefit from treatment with a GLP-1 agonist and/or SGLT2 inhibitors for best cardiovascular outcomes.  Most recent hemoglobin A1c 7.1% AS: Mild.  Probably does not require AVR at this time.  Can have TAVR down the road if necessary. HLP: Currently on high-dose atorvastatin 80 mg daily.  We will decrease the dose of fenofibrate.    Okay to transfer to telemetry.  For questions or updates, please contact [place] Please consult [email or web address] for contact info under        Signed, Sanda Klein, MD  10/17/2021, 9:41 AM

## 2021-10-17 NOTE — Progress Notes (Signed)
ANTICOAGULATION CONSULT NOTE   Pharmacy Consult for heparin Indication: atrial fibrillation  No Known Allergies  Patient Measurements: Height: '5\' 10"'$  (177.8 cm) Weight: 86.6 kg (190 lb 14.7 oz) IBW/kg (Calculated) : 73 Heparin Dosing Weight: 86.2 kg  Vital Signs: Temp: 98.5 F (36.9 C) (09/17 0645) Temp Source: Oral (09/17 0645) BP: 90/71 (09/17 1000) Pulse Rate: 61 (09/17 1015)  Labs: Recent Labs    10/15/21 0639 10/16/21 0557 10/16/21 0601 10/17/21 0755  HGB 13.3  --  13.0 13.4  HCT 39.9  --  37.7* 39.0  PLT 303  --  286 339  HEPARINUNFRC  --  0.16*  --  0.39  CREATININE 1.28* 1.35*  --  1.41*     Estimated Creatinine Clearance: 46 mL/min (A) (by C-G formula based on SCr of 1.41 mg/dL (H)).   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 pylori infection 2011   Heart murmur    History of radiation therapy 09/20/10 thru 10/29/10   gastroesophageal junction/gastric cardia   Hypertension    Neuromuscular disorder ([place])    Other and unspecified hyperlipidemia    Sleep apnea    On CPAP   Status post chemotherapy 09/22/10 thru 10/27/10   concurrent w/radiation   Unspecified sleep apnea     Medications:  Infusions:   sodium chloride     sodium chloride     heparin 1,350 Units/hr (10/17/21 1000)    Assessment: 76 yo male with CAD planning CABG after ticagrelor washout LD 9/15 am Pharmacy asked to restart heparin s/p cath for recurrent  afib Now back in SR HR 70 No overt bleeding or complications noted currently, but previously heparin was held for neck hematoma at line site CBC stable  Heparin drip 1350uts/hr Heparin level  0.39 at goal  .  Goal of Therapy:  Heparin level 0.3-0.7 units/ml Monitor platelets by anticoagulation protocol: Yes   Plan:  Continue heparin drip 1350 units/hr  Daily heparin level and CBC. Monitor s/s bleeding   Bonnita Nasuti Pharm.D. CPP, BCPS Clinical Pharmacist [phone] 10/17/2021 10:26 AM   [facility] pharmacy phone numbers are listed on [email or web address]

## 2021-10-17 NOTE — Progress Notes (Signed)
PROGRESS NOTE  Jon [name] YSA:[id number] DOB: 02-27-1945   PCP: [name], MD  Patient is from: Home.  Lives alone.  Independently ambulates at baseline.  DOA: 10/13/2021 LOS: 4  Chief complaints Chief Complaint  Patient presents with   Chest Pain   Shortness of Breath     Brief Narrative / Interim history: 76 year old M with PMH of DM-2, OSA on CPAP, paroxysmal A-fib, GEJ adenocarcinoma s/p XRT in 2012 and diverticulosis presented with new onset chest pain and near syncope associated with lightheadedness and blurry vision, and found to have inferior STEMI, bradycardia, hypotension and AKI.  Initial EKG showed atrial flutter with various transduction. Troponin trended from 11-15,000.  Subsequent EKG concerning for inferior ST elevation.  Cardiology consulted. TTE with LVEF of 60 to 65%, RWMA and G2-DD.  CT chest with significant chronic calcification.  LHC with occluded posterolateral branch felt to be the culprit lesion but flow could not be restored with balloon angioplasty.  LHC also showed severe serial mid LAD lesions that that could not be addressed by staged PCI.  Cardiothoracic surgery consulted.  Plan for CABG and ligation of LA appendage next week, Friday after Brilinta washout.  Subjective: Seen and examined earlier this morning.  No major events overnight of this morning.  No complaints.  Denies chest pain, dyspnea, palpitation or dizziness.  Denies GI or UTI symptoms.  Objective: Vitals:   10/17/21 1115 10/17/21 1130 10/17/21 1145 10/17/21 1200  BP:      Pulse: 61 72 69 70  Resp: (!) '22 18 19 18  '$ Temp:      TempSrc:      SpO2: 96% (!) 80% 93% 90%  Weight:      Height:        Examination:   GENERAL: No apparent distress.  Nontoxic. HEENT: MMM.  Vision and hearing grossly intact.  NECK: Supple.  No apparent JVD.  Pressure dressing over right leg. RESP:  No IWOB.  Fair aeration bilaterally. CVS:  RRR. Heart sounds normal.  ABD/GI/GU: BS+. Abd  soft, NTND.  MSK/EXT:  Moves extremities. No apparent deformity. No edema.  SKIN: no apparent skin lesion or wound NEURO: Awake and alert. Oriented appropriately.  No apparent focal neuro deficit. PSYCH: Calm. Normal affect.   Procedures:  9/13-LHC with occluded posterolateral branch felt to be the culprit lesion but flow could not be restored with balloon angioplasty.  LHC also showed severe serial mid LAD lesions.     Microbiology summarized: MRSA PCR screen nonreactive.  Assessment and plan: Principal Problem:   Inferior ST segment elevation Active Problems:   CHEST PAIN   PAF (paroxysmal atrial fibrillation) (HCC)   Primary adenocarcinoma of GE junction   Diabetes mellitus, type 2 (HCC)   Coronary artery disease   New onset a-fib ([place])   Near syncope   Syncope   Non-ST elevation (NSTEMI) myocardial infarction (HCC)  Inferior STEMI/CAD: Troponin trended from 11-15,000.  EKG raises concern for inferior STEMI. TTE with LVEF of 60 to 65%, RWMA and G2 DD.  CT chest with significant coronary calcification.  LHC with occluded posterolateral branch and severe serial mid LAD lesions.  PCI unsuccessful.  CVTS consulted.  Plan for CABG after Brilinta washout.  No further chest pain or cardiopulmonary symptoms. -Cardiology and CVTS following-CABG on Friday after Brilinta washout -Continue high intensity statin, low-dose aspirin and IV heparin.  Sinus symptomatic bradycardia/syncope: In the setting of the above. Bradycardia resolved. Paroxysmal A-fib/flutter: CHA2DS2-VASc score > 3.  Currently in  sinus bradycardia. -Continue telemetry monitoring -Optimize electrolytes -CVTS planning ligation of LA appendage during CABG  Hypotension: In the setting of the above.  Resolved. -Monitor  AKI: Likely due to hypotension.  Improved. Recent Labs    09/02/21 0954 09/02/21 1527 09/10/21 0914 10/13/21 0510 10/13/21 0845 10/13/21 1327 10/14/21 0623 10/15/21 0639 10/16/21 0557  10/17/21 0755  BUN '18 22 23 16 15 13 11 11 12 12  '$ CREATININE 1.16 1.37* 1.35 1.87* 1.60* 1.49* 1.33* 1.28* 1.35* 1.41*  -Continue monitoring -Avoid nephrotoxic meds   Uncontrolled NIDDM-2: A1c 7.1% on 09/02/2021. Recent Labs  Lab 10/16/21 0808 10/16/21 1202 10/16/21 1613 10/16/21 2201 10/17/21 0646  GLUCAP 196* 125* 107* 112* 109*  -Continue SSI -Continue high intensity statin  OSA on CPAP -CPAP at bedtime  Body mass index is 27.39 kg/m.           DVT prophylaxis:  Patient is on IV heparin  Code Status: Full code Family Communication: Updated patient's son and daughter-in-law at bedside. Level of care: Telemetry Cardiac Status is: Inpatient Remains inpatient appropriate because: STEMI   Final disposition: Likely home once medically cleared Consultants:  Cardiology CVTS  Sch Meds:  Scheduled Meds:  aspirin  81 mg Oral Daily   atorvastatin  80 mg Oral Daily   atropine  0.5 mg Intravenous Once   Chlorhexidine Gluconate Cloth  6 each Topical Daily   fenofibrate  54 mg Oral Daily   insulin aspart  0-15 Units Subcutaneous TID WC   pantoprazole  40 mg Oral Daily   sodium chloride flush  3 mL Intravenous Q12H   sodium chloride flush  3 mL Intravenous Q12H   Continuous Infusions:  sodium chloride     sodium chloride     heparin 1,350 Units/hr (10/17/21 1200)   PRN Meds:.sodium chloride, sodium chloride, acetaminophen, HYDROcodone-acetaminophen, loratadine, ondansetron (ZOFRAN) IV, mouth rinse, sodium chloride flush, sodium chloride flush  Antimicrobials: Anti-infectives (From admission, onward)    None        I have personally reviewed the following labs and images: CBC: Recent Labs  Lab 10/13/21 0510 10/13/21 0836 10/13/21 1628 10/14/21 0623 10/15/21 0639 10/16/21 0601 10/17/21 0755  WBC 9.0  --   --  11.5* 10.3 11.2* 8.4  NEUTROABS 3.6  --   --   --   --   --   --   HGB 15.7   < > 14.3 14.9 13.3 13.0 13.4  HCT 47.4   < > 42.0 43.9 39.9  37.7* 39.0  MCV 88.4  --   --  87.6 87.5 86.3 85.7  PLT 362  --   --  351 303 286 339   < > = values in this interval not displayed.   BMP &GFR Recent Labs  Lab 10/13/21 1327 10/13/21 1547 10/13/21 1628 10/14/21 0623 10/15/21 0639 10/16/21 0557 10/17/21 0755  NA 137   < > 139 140 142 137 136  K 4.6   < > 5.0 3.6 4.4 4.0 3.8  CL 108  --   --  106 108 105 105  CO2 22  --   --  24 26 21* 23  GLUCOSE 123*  --   --  130* 103* 108* 181*  BUN 13  --   --  '11 11 12 12  '$ CREATININE 1.49*  --   --  1.33* 1.28* 1.35* 1.41*  CALCIUM 8.6*  --   --  9.3 9.3 9.1 9.2  MG  --   --   --   --  1.9 1.9 1.9  PHOS  --   --   --   --  2.4* 2.9 3.3   < > = values in this interval not displayed.   Estimated Creatinine Clearance: 46 mL/min (A) (by C-G formula based on SCr of 1.41 mg/dL (H)). Liver & Pancreas: Recent Labs  Lab 10/13/21 0510 10/15/21 0639 10/16/21 0557 10/17/21 0755  AST 30  --   --   --   ALT 19  --   --   --   ALKPHOS 45  --   --   --   BILITOT 0.7  --   --   --   PROT 6.9  --   --   --   ALBUMIN 4.2 3.7 3.4* 3.6   Recent Labs  Lab 10/13/21 0510  LIPASE 32   No results for input(s): "AMMONIA" in the last 168 hours. Diabetic: No results for input(s): "HGBA1C" in the last 72 hours. Recent Labs  Lab 10/16/21 0808 10/16/21 1202 10/16/21 1613 10/16/21 2201 10/17/21 0646  GLUCAP 196* 125* 107* 112* 109*   Cardiac Enzymes: No results for input(s): "CKTOTAL", "CKMB", "CKMBINDEX", "TROPONINI" in the last 168 hours. No results for input(s): "PROBNP" in the last 8760 hours. Coagulation Profile: No results for input(s): "INR", "PROTIME" in the last 168 hours. Thyroid Function Tests: No results for input(s): "TSH", "T4TOTAL", "FREET4", "T3FREE", "THYROIDAB" in the last 72 hours.  Lipid Profile: No results for input(s): "CHOL", "HDL", "LDLCALC", "TRIG", "CHOLHDL", "LDLDIRECT" in the last 72 hours.  Anemia Panel: No results for input(s): "VITAMINB12", "FOLATE",  "FERRITIN", "TIBC", "IRON", "RETICCTPCT" in the last 72 hours. Urine analysis:    Component Value Date/Time   COLORURINE YELLOW 10/13/2021 0630   APPEARANCEUR CLEAR 10/13/2021 0630   LABSPEC 1.010 10/13/2021 0630   PHURINE 7.0 10/13/2021 0630   GLUCOSEU NEGATIVE 10/13/2021 0630   GLUCOSEU NEGATIVE 06/10/2019 0955   HGBUR NEGATIVE 10/13/2021 0630   BILIRUBINUR NEGATIVE 10/13/2021 0630   KETONESUR NEGATIVE 10/13/2021 0630   PROTEINUR NEGATIVE 10/13/2021 0630   UROBILINOGEN 1.0 06/10/2019 0955   NITRITE NEGATIVE 10/13/2021 0630   LEUKOCYTESUR NEGATIVE 10/13/2021 0630   Sepsis Labs: Invalid input(s): "PROCALCITONIN", "LACTICIDVEN"  Microbiology: Recent Results (from the past 240 hour(s))  MRSA Next Gen by PCR, Nasal     Status: None   Collection Time: 10/13/21  5:03 PM   Specimen: Nasal Mucosa; Nasal Swab  Result Value Ref Range Status   MRSA by PCR Next Gen NOT DETECTED NOT DETECTED Final    Comment: (NOTE) The GeneXpert MRSA Assay (FDA approved for NASAL specimens only), is one component of a comprehensive MRSA colonization surveillance program. It is not intended to diagnose MRSA infection nor to guide or monitor treatment for MRSA infections. Test performance is not FDA approved in patients less than 74 years old. Performed at [facility], [address], [place], [place] [address]     Radiology Studies: No results found.    Jametta Moorehead T. [place]  If 7PM-7AM, please contact night-coverage [email or web address] 10/17/2021, 12:53 PM

## 2021-10-17 NOTE — Progress Notes (Signed)
Pt states that he will place CPAP on himself when ready to wear.

## 2021-10-18 ENCOUNTER — Encounter (HOSPITAL_COMMUNITY): Payer: Self-pay | Admitting: Internal Medicine

## 2021-10-18 DIAGNOSIS — I200 Unstable angina: Secondary | ICD-10-CM | POA: Diagnosis not present

## 2021-10-18 DIAGNOSIS — I480 Paroxysmal atrial fibrillation: Secondary | ICD-10-CM | POA: Diagnosis not present

## 2021-10-18 DIAGNOSIS — R9431 Abnormal electrocardiogram [ECG] [EKG]: Secondary | ICD-10-CM | POA: Diagnosis not present

## 2021-10-18 DIAGNOSIS — E1159 Type 2 diabetes mellitus with other circulatory complications: Secondary | ICD-10-CM | POA: Diagnosis not present

## 2021-10-18 LAB — CBC
HCT: 41.7 % (ref 39.0–52.0)
Hemoglobin: 14.3 g/dL (ref 13.0–17.0)
MCH: 29.4 pg (ref 26.0–34.0)
MCHC: 34.3 g/dL (ref 30.0–36.0)
MCV: 85.6 fL (ref 80.0–100.0)
Platelets: 400 10*3/uL (ref 150–400)
RBC: 4.87 MIL/uL (ref 4.22–5.81)
RDW: 14.8 % (ref 11.5–15.5)
WBC: 8.2 10*3/uL (ref 4.0–10.5)
nRBC: 0 % (ref 0.0–0.2)

## 2021-10-18 LAB — GLUCOSE, CAPILLARY
Glucose-Capillary: 120 mg/dL — ABNORMAL HIGH (ref 70–99)
Glucose-Capillary: 109 mg/dL — ABNORMAL HIGH (ref 70–99)
Glucose-Capillary: 104 mg/dL — ABNORMAL HIGH (ref 70–99)
Glucose-Capillary: 93 mg/dL (ref 70–99)
Glucose-Capillary: 107 mg/dL — ABNORMAL HIGH (ref 70–99)

## 2021-10-18 LAB — HEPARIN LEVEL (UNFRACTIONATED): Heparin Unfractionated: 0.28 IU/mL — ABNORMAL LOW (ref 0.30–0.70)

## 2021-10-18 NOTE — Progress Notes (Signed)
PROGRESS NOTE  Jon [name] MPN:[id number] DOB: 1945-02-05   PCP: [name], MD  Patient is from: Home.  Lives alone.  Independently ambulates at baseline.  DOA: 10/13/2021 LOS: 5  Chief complaints Chief Complaint  Patient presents with   Chest Pain   Shortness of Breath     Brief Narrative / Interim history: 76 year old M with PMH of DM-2, OSA on CPAP, paroxysmal A-fib, GEJ adenocarcinoma s/p XRT in 2012 and diverticulosis presented with new onset chest pain and near syncope associated with lightheadedness and blurry vision, and found to have inferior STEMI, bradycardia, hypotension and AKI.  Initial EKG showed atrial flutter with various transduction. Troponin trended from 11-15,000.  Subsequent EKG concerning for inferior ST elevation.  Cardiology consulted. TTE with LVEF of 60 to 65%, RWMA and G2-DD.  CT chest with significant chronic calcification.  LHC with occluded posterolateral branch felt to be the culprit lesion but flow could not be restored with balloon angioplasty.  LHC also showed severe serial mid LAD lesions that that could not be addressed by staged PCI.  Cardiothoracic surgery consulted.  Plan for CABG and ligation of LA appendage next week, Friday after Brilinta washout.  Last dose of Brilinta the morning of 9/15.  Subjective: Seen and examined earlier this morning.  No major events overnight of this morning.  No complaints.   Objective: Vitals:   10/18/21 0600 10/18/21 0620 10/18/21 0700 10/18/21 1126  BP: 92/71  (!) 88/69   Pulse: 68  61   Resp: (!) 24  (!) 22   Temp:  99.8 F (37.7 C)  98.3 F (36.8 C)  TempSrc:  Oral  Oral  SpO2: 97%  97%   Weight:      Height:        Examination:  GENERAL: No apparent distress.  Nontoxic. HEENT: MMM.  Vision and hearing grossly intact.  NECK: Supple.  No apparent JVD.  Pressure dressing over right neck. RESP:  No IWOB.  Fair aeration bilaterally. CVS:  RRR. Heart sounds normal.  ABD/GI/GU: BS+. Abd  soft, NTND.  MSK/EXT:  Moves extremities. No apparent deformity. No edema.  SKIN: Ecchymosis in his right neck and right lateral chest NEURO: Awake and alert. Oriented appropriately.  No apparent focal neuro deficit. PSYCH: Calm. Normal affect.   Procedures:  9/13-LHC with occluded posterolateral branch felt to be the culprit lesion but flow could not be restored with balloon angioplasty.  LHC also showed severe serial mid LAD lesions.     Microbiology summarized: MRSA PCR screen nonreactive.  Assessment and plan: Principal Problem:   Inferior ST segment elevation Active Problems:   CHEST PAIN   PAF (paroxysmal atrial fibrillation) (HCC)   Primary adenocarcinoma of GE junction   Diabetes mellitus, type 2 (HCC)   Coronary artery disease   New onset a-fib ([place])   Near syncope   Syncope   Non-ST elevation (NSTEMI) myocardial infarction (HCC)  Inferior STEMI/CAD: Troponin trended from 11-15,000.  EKG raises concern for inferior STEMI. TTE with LVEF of 60 to 65%, RWMA and G2 DD.  CT chest with significant coronary calcification.  LHC with occluded posterolateral branch and severe serial mid LAD lesions.  PCI unsuccessful.  CVTS consulted.  Plan for CABG after Brilinta washout.  No further chest pain or cardiopulmonary symptoms. -Cardiology and CVTS following-CABG on Friday after Brilinta washout.  Last dose 9/15 -Continue high intensity statin, low-dose aspirin and IV heparin.  Sinus symptomatic bradycardia/syncope: In the setting of the above.  Rate  well. Paroxysmal A-fib/flutter: CHA2DS2-VASc score > 3.  Currently in sinus rhythm. -Continue telemetry monitoring -Optimize electrolytes -CVTS planning ligation of LA appendage during CABG  Hypotension: In the setting of the above.  Resolved. -Monitor  AKI: Likely due to hypotension.  Improved. Recent Labs    09/02/21 0954 09/02/21 1527 09/10/21 0914 10/13/21 0510 10/13/21 0845 10/13/21 1327 10/14/21 0623 10/15/21 0639  10/16/21 0557 10/17/21 0755  BUN '18 22 23 16 15 13 11 11 12 12  '$ CREATININE 1.16 1.37* 1.35 1.87* 1.60* 1.49* 1.33* 1.28* 1.35* 1.41*  -Continue monitoring -Avoid nephrotoxic meds   Uncontrolled NIDDM-2: A1c 7.1% on 09/02/2021. Recent Labs  Lab 10/17/21 0646 10/17/21 1608 10/17/21 2203 10/18/21 0610 10/18/21 1128  GLUCAP 109* 147* 127* 104* 93  -Continue SSI -Continue high intensity statin  OSA on CPAP -CPAP at bedtime  Body mass index is 27.46 kg/m.           DVT prophylaxis:  Patient is on IV heparin  Code Status: Full code Family Communication: Updated patient's son and daughter-in-law at bedside. Level of care: Telemetry Cardiac Status is: Inpatient Remains inpatient appropriate because: STEMI   Final disposition: Likely home once medically cleared Consultants:  Cardiology CVTS  Sch Meds:  Scheduled Meds:  aspirin  81 mg Oral Daily   atorvastatin  80 mg Oral Daily   atropine  0.5 mg Intravenous Once   Chlorhexidine Gluconate Cloth  6 each Topical Daily   fenofibrate  54 mg Oral Daily   insulin aspart  0-15 Units Subcutaneous TID WC   pantoprazole  40 mg Oral Daily   sodium chloride flush  3 mL Intravenous Q12H   sodium chloride flush  3 mL Intravenous Q12H   Continuous Infusions:  sodium chloride     sodium chloride     heparin 1,400 Units/hr (10/18/21 1054)   PRN Meds:.sodium chloride, sodium chloride, acetaminophen, HYDROcodone-acetaminophen, loratadine, ondansetron (ZOFRAN) IV, mouth rinse, sodium chloride flush, sodium chloride flush  Antimicrobials: Anti-infectives (From admission, onward)    None        I have personally reviewed the following labs and images: CBC: Recent Labs  Lab 10/13/21 0510 10/13/21 0836 10/14/21 0623 10/15/21 0639 10/16/21 0601 10/17/21 0755 10/18/21 0822  WBC 9.0  --  11.5* 10.3 11.2* 8.4 8.2  NEUTROABS 3.6  --   --   --   --   --   --   HGB 15.7   < > 14.9 13.3 13.0 13.4 14.3  HCT 47.4   < > 43.9  39.9 37.7* 39.0 41.7  MCV 88.4  --  87.6 87.5 86.3 85.7 85.6  PLT 362  --  351 303 286 339 400   < > = values in this interval not displayed.   BMP &GFR Recent Labs  Lab 10/13/21 1327 10/13/21 1547 10/13/21 1628 10/14/21 0623 10/15/21 0639 10/16/21 0557 10/17/21 0755  NA 137   < > 139 140 142 137 136  K 4.6   < > 5.0 3.6 4.4 4.0 3.8  CL 108  --   --  106 108 105 105  CO2 22  --   --  24 26 21* 23  GLUCOSE 123*  --   --  130* 103* 108* 181*  BUN 13  --   --  '11 11 12 12  '$ CREATININE 1.49*  --   --  1.33* 1.28* 1.35* 1.41*  CALCIUM 8.6*  --   --  9.3 9.3 9.1 9.2  MG  --   --   --   --  1.9 1.9 1.9  PHOS  --   --   --   --  2.4* 2.9 3.3   < > = values in this interval not displayed.   Estimated Creatinine Clearance: 46 mL/min (A) (by C-G formula based on SCr of 1.41 mg/dL (H)). Liver & Pancreas: Recent Labs  Lab 10/13/21 0510 10/15/21 0639 10/16/21 0557 10/17/21 0755  AST 30  --   --   --   ALT 19  --   --   --   ALKPHOS 45  --   --   --   BILITOT 0.7  --   --   --   PROT 6.9  --   --   --   ALBUMIN 4.2 3.7 3.4* 3.6   Recent Labs  Lab 10/13/21 0510  LIPASE 32   No results for input(s): "AMMONIA" in the last 168 hours. Diabetic: No results for input(s): "HGBA1C" in the last 72 hours. Recent Labs  Lab 10/17/21 0646 10/17/21 1608 10/17/21 2203 10/18/21 0610 10/18/21 1128  GLUCAP 109* 147* 127* 104* 93   Cardiac Enzymes: No results for input(s): "CKTOTAL", "CKMB", "CKMBINDEX", "TROPONINI" in the last 168 hours. No results for input(s): "PROBNP" in the last 8760 hours. Coagulation Profile: No results for input(s): "INR", "PROTIME" in the last 168 hours. Thyroid Function Tests: No results for input(s): "TSH", "T4TOTAL", "FREET4", "T3FREE", "THYROIDAB" in the last 72 hours.  Lipid Profile: No results for input(s): "CHOL", "HDL", "LDLCALC", "TRIG", "CHOLHDL", "LDLDIRECT" in the last 72 hours.  Anemia Panel: No results for input(s): "VITAMINB12", "FOLATE",  "FERRITIN", "TIBC", "IRON", "RETICCTPCT" in the last 72 hours. Urine analysis:    Component Value Date/Time   COLORURINE YELLOW 10/13/2021 0630   APPEARANCEUR CLEAR 10/13/2021 0630   LABSPEC 1.010 10/13/2021 0630   PHURINE 7.0 10/13/2021 0630   GLUCOSEU NEGATIVE 10/13/2021 0630   GLUCOSEU NEGATIVE 06/10/2019 0955   HGBUR NEGATIVE 10/13/2021 0630   BILIRUBINUR NEGATIVE 10/13/2021 0630   KETONESUR NEGATIVE 10/13/2021 0630   PROTEINUR NEGATIVE 10/13/2021 0630   UROBILINOGEN 1.0 06/10/2019 0955   NITRITE NEGATIVE 10/13/2021 0630   LEUKOCYTESUR NEGATIVE 10/13/2021 0630   Sepsis Labs: Invalid input(s): "PROCALCITONIN", "LACTICIDVEN"  Microbiology: Recent Results (from the past 240 hour(s))  MRSA Next Gen by PCR, Nasal     Status: None   Collection Time: 10/13/21  5:03 PM   Specimen: Nasal Mucosa; Nasal Swab  Result Value Ref Range Status   MRSA by PCR Next Gen NOT DETECTED NOT DETECTED Final    Comment: (NOTE) The GeneXpert MRSA Assay (FDA approved for NASAL specimens only), is one component of a comprehensive MRSA colonization surveillance program. It is not intended to diagnose MRSA infection nor to guide or monitor treatment for MRSA infections. Test performance is not FDA approved in patients less than 53 years old. Performed at [facility], [place] [address], [place], [place] [address]     Radiology Studies: No results found.    Elsa Ploch T. [name]  If 7PM-7AM, please contact night-coverage [email or web address] 10/18/2021, 11:49 AM

## 2021-10-18 NOTE — TOC Initial Note (Signed)
Transition of Care [facility]) - Initial/Assessment Note    Patient Details  Name: Jon [name] MRN: [id number] Date of Birth: [birth year]  [facility] CM/SW Contact:    Bethena Roys, RN Phone Number: 10/18/2021, 4:35 PM  Clinical Narrative: Risk for readmission assessment completed. PTA patient was independent from home alone. Patient states he still drives to appointments and gets his medications appropriately. Case Manager will continue to follow for transition of care needs.                 Expected Discharge Plan: Home/Self Care Barriers to Discharge: Continued Medical Work up   Patient Goals and CMS Choice Patient states their goals for this hospitalization and ongoing recovery are:: to return home.   Choice offered to / list presented to : NA  Expected Discharge Plan and Services Expected Discharge Plan: Home/Self Care In-house Referral: NA Discharge Planning Services: CM Consult Post Acute Care Choice: NA Living arrangements for the past 2 months: Single Family Home                 DME Arranged: N/A DME Agency: NA       HH Arranged: NA          Prior Living Arrangements/Services Living arrangements for the past 2 months: Single Family Home Lives with:: Self Patient language and need for interpreter reviewed:: Yes Do you feel safe going back to the place where you live?: Yes      Need for Family Participation in Patient Care: No (Comment) Care giver support system in place?: No (comment) Current home services: DME (pt has rolling walker, rollator, and cane in the home.) Criminal Activity/Legal Involvement Pertinent to Current Situation/Hospitalization: No - Comment as needed  Activities of Daily Living Home Assistive Devices/Equipment: CPAP, Eyeglasses ADL Screening (condition at time of admission) Patient's cognitive ability adequate to safely complete daily activities?: Yes Is the patient deaf or have difficulty hearing?:  No Does the patient have difficulty seeing, even when wearing glasses/contacts?: No Does the patient have difficulty concentrating, remembering, or making decisions?: No Patient able to express need for assistance with ADLs?: Yes Does the patient have difficulty dressing or bathing?: No Independently performs ADLs?: Yes (appropriate for developmental age) Does the patient have difficulty walking or climbing stairs?: No Weakness of Legs: None Weakness of Arms/Hands: None  Permission Sought/Granted Permission sought to share information with : Case Manager      Emotional Assessment Appearance:: Appears stated age Attitude/Demeanor/Rapport: Engaged Affect (typically observed): Appropriate Orientation: : Oriented to Self, Oriented to Place, Oriented to  Time, Oriented to Situation Alcohol / Substance Use: Not Applicable Psych Involvement: No (comment)  Admission diagnosis:  Precordial chest pain [R07.2] Syncope [R55] AKI (acute kidney injury) ([place]) [N17.9] Atrial fibrillation, unspecified type [facility]) [I48.91] Patient Active Problem List   Diagnosis Date Noted   Inferior ST segment elevation 10/14/2021   New onset a-fib ([place]) 10/13/2021   Near syncope 10/13/2021   Syncope 10/13/2021   Non-ST elevation (NSTEMI) myocardial infarction ([place])    Low sodium levels 09/10/2021   Rash 09/02/2021   Obesity (BMI 30.0-34.9) 02/24/2020   Callus of heel 06/10/2019   Abnormal urine color 11/13/2018   Leg edema, right 10/30/2018   Coronary artery disease 03/28/2018   Paresthesia 03/28/2018   Polycythemia 12/21/2015   Erectile dysfunction 06/18/2013   Piriformis syndrome of right side 01/08/2013   Diabetes mellitus, type 2 ([place]) 02/16/2012   Shoulder pain, right 10/17/2011   Well adult exam  07/13/2011   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[place])    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[place])    Status post chemotherapy    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   Dysphagia 07/09/2010   Hypertension  07/09/2010   Neoplasm of uncertain behavior of skin 12/31/2009   CERUMEN IMPACTION 12/31/2009   HYPERGLYCEMIA 12/31/2009   VERTIGO 16/11/9602   HELICOBACTER PYLORI INFECTION 06/30/2009   GERD 06/16/2009   CHEST PAIN 06/16/2009   Obstructive sleep apnea 10/31/2008   POLYP, COLON 10/31/2007   Dyslipidemia 10/31/2007   PAF (paroxysmal atrial fibrillation) ([place]) 10/31/2007   PCP:  [name], MD Pharmacy:   [facility] [place], [place] [address] [place] [address] Phone: [phone] Fax: [phone]  [facility] [place], [place] [address] Phone: [phone] Fax: [phone]  Readmission Risk Interventions    10/18/2021    4:34 PM  Readmission Risk Prevention Plan  Transportation Screening Complete  PCP or Specialist Appt within 3-5 Days Complete  HRI or Home Care Consult Complete  Social Work Consult for [place] Planning/Counseling Complete  Palliative Care Screening Not Applicable  Medication Review Press photographer) Referral to Pharmacy

## 2021-10-18 NOTE — Progress Notes (Signed)
CARDIAC REHAB PHASE I   Pt pre OHS education including IS use, OHS booklet, OHS handout, sternal precautions, move in the tub, home needs at discharge and early ambulation importance reviewed. All questions and concerns addressed. Able to reach [phone] with IS today. Will continue to follow.   [id number]  Vanessa Barbara, RN BSN 10/18/2021 8:51 AM

## 2021-10-18 NOTE — Progress Notes (Signed)
[place] for heparin Indication: atrial fibrillation  No Known Allergies  Patient Measurements: Height: '5\' 10"'$  (177.8 cm) Weight: 86.8 kg (191 lb 5.8 oz) IBW/kg (Calculated) : 73 Heparin Dosing Weight: 86.2 kg  Vital Signs: Temp: 99.8 F (37.7 C) (09/18 0620) Temp Source: Oral (09/18 0620) BP: 88/69 (09/18 0700) Pulse Rate: 61 (09/18 0700)  Labs: Recent Labs    10/16/21 0557 10/16/21 0601 10/16/21 0601 10/17/21 0755 10/18/21 0822  HGB  --  13.0   < > 13.4 14.3  HCT  --  37.7*  --  39.0 41.7  PLT  --  286  --  339 400  HEPARINUNFRC 0.16*  --   --  0.39 0.28*  CREATININE 1.35*  --   --  1.41*  --    < > = values in this interval not displayed.     Estimated Creatinine Clearance: 46 mL/min (A) (by C-G formula based on SCr of 1.41 mg/dL (H)).   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 pylori infection 2011   Heart murmur    History of radiation therapy 09/20/10 thru 10/29/10   gastroesophageal junction/gastric cardia   Hypertension    Neuromuscular disorder ([place])    Other and unspecified hyperlipidemia    Sleep apnea    On CPAP   Status post chemotherapy 09/22/10 thru 10/27/10   concurrent w/radiation   Unspecified sleep apnea     Medications:  Infusions:   sodium chloride     sodium chloride     heparin 1,350 Units/hr (10/18/21 0951)    Assessment: 76 yo male with CAD planning CABG after ticagrelor washout LD 9/15 am Pharmacy asked to restart heparin s/p cath for recurrent  afib Now back in SR HR 70 No overt bleeding or complications noted currently, but previously heparin was  held for neck hematoma at line site CBC stable   Heparin drip at 1350 units/hr  with heparin level just slightly below goal at 0.28.   Goal of Therapy:  Heparin level 0.3-0.7 units/ml Monitor platelets by anticoagulation protocol: Yes   Plan:  Increase heparin gtt slightly to 1400 units/hr to keep in range. Daily heparin level and CBC. Monitor s/s bleeding   [place] Crane, Roylene Reason, [facility] Clinical Pharmacist  10/18/2021 10:44 AM   [facility] pharmacy phone numbers are listed on [email or web address]

## 2021-10-18 NOTE — Progress Notes (Signed)
Patient arrived from [facility] in wheelchair.  Placed on telemetry and verified with second RN. VSS.

## 2021-10-18 NOTE — Progress Notes (Signed)
Patient Name: Jon [name] Date of Encounter: 10/18/2021  Primary Cardiologist: Harrington Challenger  Subjective   No recurrent chest pain  Inpatient Medications    Scheduled Meds:  aspirin  81 mg Oral Daily   atorvastatin  80 mg Oral Daily   atropine  0.5 mg Intravenous Once   Chlorhexidine Gluconate Cloth  6 each Topical Daily   fenofibrate  54 mg Oral Daily   insulin aspart  0-15 Units Subcutaneous TID WC   pantoprazole  40 mg Oral Daily   sodium chloride flush  3 mL Intravenous Q12H   sodium chloride flush  3 mL Intravenous Q12H   Continuous Infusions:  sodium chloride     sodium chloride     heparin 1,350 Units/hr (10/18/21 0600)   PRN Meds: sodium chloride, sodium chloride, acetaminophen, HYDROcodone-acetaminophen, loratadine, ondansetron (ZOFRAN) IV, mouth rinse, sodium chloride flush, sodium chloride flush   Vital Signs    Vitals:   10/18/21 0545 10/18/21 0600 10/18/21 0620 10/18/21 0700  BP:  92/71  (!) 88/69  Pulse:  68  61  Resp:  (!) 24  (!) 22  Temp:   99.8 F (37.7 C)   TempSrc:   Oral   SpO2:  97%  97%  Weight: 86.8 kg     Height: '5\' 10"'$  (1.778 m)       Intake/Output Summary (Last 24 hours) at 10/18/2021 0756 Last data filed at 10/18/2021 0600 Gross per 24 hour  Intake 1330.72 ml  Output 2725 ml  Net -1394.28 ml    I/O since admission: -3833.1  Filed Weights   10/16/21 0700 10/17/21 0530 10/18/21 0545  Weight: 87.2 kg 86.6 kg 86.8 kg    Telemetry    NSR at 70 - Personally Reviewed  ECG    10/16/2021 ECG (independently read by me): NSR at 64, NST wave abnormality  Physical Exam    BP (!) 88/69   Pulse 61   Temp 99.8 F (37.7 C) (Oral)   Resp (!) 22   Ht '5\' 10"'$  (1.778 m)   Wt 86.8 kg   SpO2 97%   BMI 27.46 kg/m  General: Alert, oriented, no distress.  Skin: normal turgor, no rashes, warm and dry HEENT: Normocephalic, atraumatic. Pupils equal round and reactive to light; sclera anicteric; extraocular muscles intact;  Nose  without nasal septal hypertrophy Mouth/Parynx benign; Mallinpatti scale  Neck: hematoma/ ecchymosis right neck with bandage Lungs: clear to ausculatation and percussion; no wheezing or rales Chest wall: without tenderness to palpitation Heart: PMI not displaced, RRR, s1 s2 normal, 1/6 early peaking systolic murmur, no diastolic murmur, no rubs, gallops, thrills, or heaves Abdomen: soft, nontender; no hepatosplenomehaly, BS+; abdominal aorta nontender and not dilated by palpation. Back: no CVA tenderness Pulses 2+ Musculoskeletal: full range of motion, normal strength, no joint deformities Extremities: no clubbing cyanosis or edema, Homan's sign negative  Neurologic: grossly nonfocal; Cranial nerves grossly wnl Psychologic: Normal mood and affect   Labs    Chemistry Recent Labs  Lab 10/13/21 0510 10/13/21 0845 10/15/21 0639 10/16/21 0557 10/17/21 0755  NA 139   < > 142 137 136  K 4.1   < > 4.4 4.0 3.8  CL 104   < > 108 105 105  CO2 25   < > 26 21* 23  GLUCOSE 123*   < > 103* 108* 181*  BUN 16   < > '11 12 12  '$ CREATININE 1.87*   < > 1.28* 1.35* 1.41*  CALCIUM  9.9   < > 9.3 9.1 9.2  PROT 6.9  --   --   --   --   ALBUMIN 4.2  --  3.7 3.4* 3.6  AST 30  --   --   --   --   ALT 19  --   --   --   --   ALKPHOS 45  --   --   --   --   BILITOT 0.7  --   --   --   --   GFRNONAA 37*   < > 58* 54* 52*  ANIONGAP 10   < > '8 11 8   '$ < > = values in this interval not displayed.     Hematology Recent Labs  Lab 2021/11/10 0639 10/16/21 0601 10/17/21 0755  WBC 10.3 11.2* 8.4  RBC 4.56 4.37 4.55  HGB 13.3 13.0 13.4  HCT 39.9 37.7* 39.0  MCV 87.5 86.3 85.7  MCH 29.2 29.7 29.5  MCHC 33.3 34.5 34.4  RDW 14.9 15.1 14.8  PLT 303 286 339    Cardiac Enzymes  Lab 10/13/21 0714 10/13/21 0836 10/13/21 1150 10/13/21 1327 10/13/21 1854  TROPONINIHS 397* 1,101* 9,868* 9,340* 15,166*        BNPNo results for input(s): "BNP", "PROBNP" in the last 168 hours.   DDimer No results for  input(s): "DDIMER" in the last 168 hours.   Lipid Panel     Component Value Date/Time   CHOL 135 10/14/2021 0623   TRIG 164 (H) 10/14/2021 0623   HDL 42 10/14/2021 0623   CHOLHDL 3.2 10/14/2021 0623   VLDL 33 10/14/2021 0623   LDLCALC 60 10/14/2021 0623   LDLDIRECT 30.0 11/10/2020 1018     Radiology    No results found.  Cardiac Studies   Limited echocardiogram 11/10/2021 1. The aortic valve is calcified. There is severe calcifcation of the aortic valve. Aortic valve regurgitation is not visualized. Mild aortic valve stenosis. Aortic valve area, by VTI measures 1.92 cm. Aortic valve mean gradient measures 9.0 mmHg. Aortic valve Vmax measures 1.89 m/s. Conclusion(s)/Recommendation(s): Limited study to assess aortic valve stenosis. Stenosis is mild visually, by gradients, by dimensionless index, and by valve area.    Echocardiogram 10/13/2021  1. Left ventricular ejection fraction, by estimation, is 60 to 65%. The  left ventricle has normal function. The left ventricle demonstrates  regional wall motion abnormalities with basal inferior and basal  inferolateral severe hypokinesis. There is mild  concentric left ventricular hypertrophy. Left ventricular diastolic  parameters are consistent with Grade II diastolic dysfunction  (pseudonormalization).   2. Right ventricular systolic function is normal. The right ventricular  size is normal. Tricuspid regurgitation signal is inadequate for assessing  PA pressure.   3. The mitral valve is normal in structure. Mild mitral valve  regurgitation. No evidence of mitral stenosis.   4. The aortic valve is tricuspid. There is severe calcification of the  aortic valve. Aortic valve regurgitation is not visualized. There appears  to be some degree of aortic stenosis but the aortic valve was not fully  interrogated by doppler. Would  consider repeat limited echo with attention to aortic valve doppler  interrogation.   5. The inferior vena  cava is normal in size with greater than 50%  respiratory variability, suggesting right atrial pressure of 3 mmHg.    Cardiac catheterization 11-10-21 Diagnostic Dominance: Right   Patient Profile     76 y.o. male with multivessel CAD, presenting with inferior STEMI underwent  incomplete PCI to RPLV (09/13), also unable to perform PCI for calcified and highly angulated proximal LAD 90% stenosis (09/15), also has 80% mid LAD stenosis, 50% mid-RCA stenosis, with plan for CABG after brilinta washout tentatively scheduled for 10/22/21.   Additional medical problems include paroxysmal atrial fibrillation, HTN, DM2, OSA, mild aortic stenosis  Assessment & Plan    1. CAD: Severe calcified LAD  treated with low level PTCA and unsuccessful attempt at atherectomy of LAD and reperfusion of RPLV branch. No recurrent angina symptoms. Currently on brilinta washout;  2. Mild aortic stenosis; peak gradient 14.3 on echo. 3. Grade 2 diastolic dysfunction. 4. PAF: maintaining sinus rhythm with plan for LA appendage occlusion/?maze at CABG 5. OSA on CPAP 6. DM type 2; consider future GLP-1 agonist/ SGLT2 inhibitors with cardiovascular benefit. 7. HLP" currently on atorvastatin 80 mg; LDL 60, on low dose fenofibrate TG 160.  Can transfer to cardiac telemetry if bed available.  Signed, Troy Sine, MD, [facility] 10/18/2021, 7:56 AM

## 2021-10-19 DIAGNOSIS — I2510 Atherosclerotic heart disease of native coronary artery without angina pectoris: Secondary | ICD-10-CM | POA: Diagnosis not present

## 2021-10-19 DIAGNOSIS — I480 Paroxysmal atrial fibrillation: Secondary | ICD-10-CM | POA: Diagnosis not present

## 2021-10-19 DIAGNOSIS — I493 Ventricular premature depolarization: Secondary | ICD-10-CM

## 2021-10-19 DIAGNOSIS — I200 Unstable angina: Secondary | ICD-10-CM | POA: Diagnosis not present

## 2021-10-19 DIAGNOSIS — E1159 Type 2 diabetes mellitus with other circulatory complications: Secondary | ICD-10-CM | POA: Diagnosis not present

## 2021-10-19 DIAGNOSIS — R9431 Abnormal electrocardiogram [ECG] [EKG]: Secondary | ICD-10-CM | POA: Diagnosis not present

## 2021-10-19 LAB — CBC
HCT: 38.8 % — ABNORMAL LOW (ref 39.0–52.0)
Hemoglobin: 13.4 g/dL (ref 13.0–17.0)
MCH: 29.5 pg (ref 26.0–34.0)
MCHC: 34.5 g/dL (ref 30.0–36.0)
MCV: 85.3 fL (ref 80.0–100.0)
Platelets: 387 10*3/uL (ref 150–400)
RBC: 4.55 MIL/uL (ref 4.22–5.81)
RDW: 14.6 % (ref 11.5–15.5)
WBC: 9.6 10*3/uL (ref 4.0–10.5)
nRBC: 0 % (ref 0.0–0.2)

## 2021-10-19 LAB — GLUCOSE, CAPILLARY
Glucose-Capillary: 105 mg/dL — ABNORMAL HIGH (ref 70–99)
Glucose-Capillary: 89 mg/dL (ref 70–99)
Glucose-Capillary: 114 mg/dL — ABNORMAL HIGH (ref 70–99)
Glucose-Capillary: 178 mg/dL — ABNORMAL HIGH (ref 70–99)

## 2021-10-19 LAB — HEPARIN LEVEL (UNFRACTIONATED): Heparin Unfractionated: 0.36 IU/mL (ref 0.30–0.70)

## 2021-10-19 NOTE — Progress Notes (Signed)
PROGRESS NOTE  RENDER Jon [name] SWN:[id number] DOB: 29-Apr-1945   PCP: [name], MD  Patient is from: Home.  Lives alone.  Independently ambulates at baseline.  DOA: 10/13/2021 LOS: 6  Chief complaints Chief Complaint  Patient presents with   Chest Pain   Shortness of Breath     Brief Narrative / Interim history: 76 year old M with PMH of DM-2, OSA on CPAP, paroxysmal A-fib, GEJ adenocarcinoma s/p XRT in 2012 and diverticulosis presented with new onset chest pain and near syncope associated with lightheadedness and blurry vision, and found to have inferior STEMI, bradycardia, hypotension and AKI.  Initial EKG showed atrial flutter with various transduction. Troponin trended from 11-15,000.  Subsequent EKG concerning for inferior ST elevation.  Cardiology consulted. TTE with LVEF of 60 to 65%, RWMA and G2-DD. CT chest with significant chronic calcification.  LHC with occluded posterolateral branch felt to be the culprit lesion but flow could not be restored with balloon angioplasty.  LHC also showed severe serial mid LAD lesions that that could not be addressed by staged PCI.  Cardiothoracic surgery consulted.  Plan for CABG and ligation of LA appendage next week, Friday after Brilinta washout.  Last dose of Brilinta the morning of 9/15.  Subjective: Seen and examined earlier this morning.  No major events overnight of this morning.  No complaints.  Objective: Vitals:   10/19/21 0324 10/19/21 0533 10/19/21 0917 10/19/21 1108  BP: 96/74  97/77 97/76  Pulse: (!) 55  60 (!) 55  Resp: '18  17 16  '$ Temp: 97.8 F (36.6 C)   97.7 F (36.5 C)  TempSrc: Oral   Oral  SpO2: 96%  99% 97%  Weight:  86 kg    Height:        Examination:  GENERAL: No apparent distress.  Nontoxic. HEENT: MMM.  Vision and hearing grossly intact.  NECK: Supple.  No apparent JVD.  Pressure dressing over right leg. RESP:  No IWOB.  Fair aeration bilaterally. CVS:  RRR. Heart sounds normal.  ABD/GI/GU:  BS+. Abd soft, NTND.  MSK/EXT:  Moves extremities. No apparent deformity. No edema.  SKIN: Ecchymosis/skin bruise in right neck and right lateral chest NEURO: Awake and alert. Oriented appropriately.  No apparent focal neuro deficit. PSYCH: Calm. Normal affect.   Procedures:  9/13-LHC with occluded posterolateral branch felt to be the culprit lesion but flow could not be restored with balloon angioplasty.  LHC also showed severe serial mid LAD lesions.     Microbiology summarized: MRSA PCR screen nonreactive.  Assessment and plan: Principal Problem:   Inferior ST segment elevation Active Problems:   CHEST PAIN   PAF (paroxysmal atrial fibrillation) (HCC)   Primary adenocarcinoma of GE junction   Diabetes mellitus, type 2 (HCC)   Coronary artery disease   New onset a-fib ([place])   Near syncope   Syncope   Non-ST elevation (NSTEMI) myocardial infarction (H[facility])  Inferior STEMI/CAD: Troponin trended from 11-15,000.  EKG raises concern for inferior STEMI. TTE with LVEF of 60 to 65%, RWMA and G2 DD.  CT chest with significant coronary calcification.  LHC with occluded posterolateral branch and severe serial mid LAD lesions.  PCI unsuccessful.  CVTS consulted.  Plan for CABG after Brilinta washout.  No further chest pain or cardiopulmonary symptoms. -Cardiology and CVTS following-CABG on Friday after Brilinta washout.  Last dose 9/15 -Continue high intensity statin, low-dose aspirin and IV heparin.  Sinus symptomatic bradycardia/syncope: In the setting of the above.  Rate well. Paroxysmal  A-fib/flutter: CHA2DS2-VASc score > 3.  Currently in sinus rhythm. -Continue telemetry monitoring -Optimize electrolytes -CVTS planning ligation of LA appendage during CABG  Hypotension: In the setting of the above.  Resolved. -Monitor  AKI: Likely due to hypotension.  Improved. Recent Labs    09/02/21 0954 09/02/21 1527 09/10/21 0914 10/13/21 0510 10/13/21 0845 10/13/21 1327 10/14/21 0623  10/15/21 0639 10/16/21 0557 10/17/21 0755  BUN '18 22 23 16 15 13 11 11 12 12  '$ CREATININE 1.16 1.37* 1.35 1.87* 1.60* 1.49* 1.33* 1.28* 1.35* 1.41*  -Continue monitoring -Avoid nephrotoxic meds   Uncontrolled NIDDM-2: A1c 7.1% on 09/02/2021. Recent Labs  Lab 10/18/21 0610 10/18/21 1128 10/18/21 1622 10/19/21 0540 10/19/21 1144  GLUCAP 104* 93 107* 105* 89  -Continue SSI -Continue high intensity statin  OSA on CPAP -CPAP at bedtime  Body mass index is 27.2 kg/m.           DVT prophylaxis:  Patient is on IV heparin  Code Status: Full code Family Communication: None at bedside. Level of care: Telemetry Cardiac Status is: Inpatient Remains inpatient appropriate because: STEMI waiting on CABG   Final disposition: Likely home after CABG Consultants:  Cardiology CVTS  Sch Meds:  Scheduled Meds:  aspirin  81 mg Oral Daily   atorvastatin  80 mg Oral Daily   atropine  0.5 mg Intravenous Once   Chlorhexidine Gluconate Cloth  6 each Topical Daily   fenofibrate  54 mg Oral Daily   insulin aspart  0-15 Units Subcutaneous TID WC   pantoprazole  40 mg Oral Daily   sodium chloride flush  3 mL Intravenous Q12H   sodium chloride flush  3 mL Intravenous Q12H   Continuous Infusions:  sodium chloride     sodium chloride     heparin 1,400 Units/hr (10/19/21 0450)   PRN Meds:.sodium chloride, sodium chloride, acetaminophen, HYDROcodone-acetaminophen, loratadine, ondansetron (ZOFRAN) IV, mouth rinse, sodium chloride flush, sodium chloride flush  Antimicrobials: Anti-infectives (From admission, onward)    None        I have personally reviewed the following labs and images: CBC: Recent Labs  Lab 10/13/21 0510 10/13/21 0836 10/15/21 0639 10/16/21 0601 10/17/21 0755 10/18/21 0822 10/19/21 0630  WBC 9.0   < > 10.3 11.2* 8.4 8.2 9.6  NEUTROABS 3.6  --   --   --   --   --   --   HGB 15.7   < > 13.3 13.0 13.4 14.3 13.4  HCT 47.4   < > 39.9 37.7* 39.0 41.7 38.8*   MCV 88.4   < > 87.5 86.3 85.7 85.6 85.3  PLT 362   < > 303 286 339 400 387   < > = values in this interval not displayed.   BMP &GFR Recent Labs  Lab 10/13/21 1327 10/13/21 1547 10/13/21 1628 10/14/21 0623 10/15/21 0639 10/16/21 0557 10/17/21 0755  NA 137   < > 139 140 142 137 136  K 4.6   < > 5.0 3.6 4.4 4.0 3.8  CL 108  --   --  106 108 105 105  CO2 22  --   --  24 26 21* 23  GLUCOSE 123*  --   --  130* 103* 108* 181*  BUN 13  --   --  '11 11 12 12  '$ CREATININE 1.49*  --   --  1.33* 1.28* 1.35* 1.41*  CALCIUM 8.6*  --   --  9.3 9.3 9.1 9.2  MG  --   --   --   --  1.9 1.9 1.9  PHOS  --   --   --   --  2.4* 2.9 3.3   < > = values in this interval not displayed.   Estimated Creatinine Clearance: 46 mL/min (A) (by C-G formula based on SCr of 1.41 mg/dL (H)). Liver & Pancreas: Recent Labs  Lab 10/13/21 0510 10/15/21 0639 10/16/21 0557 10/17/21 0755  AST 30  --   --   --   ALT 19  --   --   --   ALKPHOS 45  --   --   --   BILITOT 0.7  --   --   --   PROT 6.9  --   --   --   ALBUMIN 4.2 3.7 3.4* 3.6   Recent Labs  Lab 10/13/21 0510  LIPASE 32   No results for input(s): "AMMONIA" in the last 168 hours. Diabetic: No results for input(s): "HGBA1C" in the last 72 hours. Recent Labs  Lab 10/18/21 0610 10/18/21 1128 10/18/21 1622 10/19/21 0540 10/19/21 1144  GLUCAP 104* 93 107* 105* 89   Cardiac Enzymes: No results for input(s): "CKTOTAL", "CKMB", "CKMBINDEX", "TROPONINI" in the last 168 hours. No results for input(s): "PROBNP" in the last 8760 hours. Coagulation Profile: No results for input(s): "INR", "PROTIME" in the last 168 hours. Thyroid Function Tests: No results for input(s): "TSH", "T4TOTAL", "FREET4", "T3FREE", "THYROIDAB" in the last 72 hours.  Lipid Profile: No results for input(s): "CHOL", "HDL", "LDLCALC", "TRIG", "CHOLHDL", "LDLDIRECT" in the last 72 hours.  Anemia Panel: No results for input(s): "VITAMINB12", "FOLATE", "FERRITIN", "TIBC",  "IRON", "RETICCTPCT" in the last 72 hours. Urine analysis:    Component Value Date/Time   COLORURINE YELLOW 10/13/2021 0630   APPEARANCEUR CLEAR 10/13/2021 0630   LABSPEC 1.010 10/13/2021 0630   PHURINE 7.0 10/13/2021 0630   GLUCOSEU NEGATIVE 10/13/2021 0630   GLUCOSEU NEGATIVE 06/10/2019 0955   HGBUR NEGATIVE 10/13/2021 0630   BILIRUBINUR NEGATIVE 10/13/2021 0630   KETONESUR NEGATIVE 10/13/2021 0630   PROTEINUR NEGATIVE 10/13/2021 0630   UROBILINOGEN 1.0 06/10/2019 0955   NITRITE NEGATIVE 10/13/2021 0630   LEUKOCYTESUR NEGATIVE 10/13/2021 0630   Sepsis Labs: Invalid input(s): "PROCALCITONIN", "LACTICIDVEN"  Microbiology: Recent Results (from the past 240 hour(s))  MRSA Next Gen by PCR, Nasal     Status: None   Collection Time: 10/13/21  5:03 PM   Specimen: Nasal Mucosa; Nasal Swab  Result Value Ref Range Status   MRSA by PCR Next Gen NOT DETECTED NOT DETECTED Final    Comment: (NOTE) The GeneXpert MRSA Assay (FDA approved for NASAL specimens only), is one component of a comprehensive MRSA colonization surveillance program. It is not intended to diagnose MRSA infection nor to guide or monitor treatment for MRSA infections. Test performance is not FDA approved in patients less than 86 years old. Performed at [facility], [place] [address] [place], [place] [address]     Radiology Studies: No results found.    Reha Martinovich T. Christiana  If 7PM-7AM, please contact night-coverage [email or web address] 10/19/2021, 3:28 PM

## 2021-10-19 NOTE — Progress Notes (Signed)
Patient placed himself on CPAP for the night  

## 2021-10-19 NOTE — Progress Notes (Signed)
Patient Name: Jon [name] Date of Encounter: 10/19/2021  Primary Cardiologist: Harrington Challenger  Subjective   No chest pain or dyspnea  Inpatient Medications    Scheduled Meds:  aspirin  81 mg Oral Daily   atorvastatin  80 mg Oral Daily   atropine  0.5 mg Intravenous Once   Chlorhexidine Gluconate Cloth  6 each Topical Daily   fenofibrate  54 mg Oral Daily   insulin aspart  0-15 Units Subcutaneous TID WC   pantoprazole  40 mg Oral Daily   sodium chloride flush  3 mL Intravenous Q12H   sodium chloride flush  3 mL Intravenous Q12H   Continuous Infusions:  sodium chloride     sodium chloride     heparin 1,400 Units/hr (10/19/21 0450)   PRN Meds: sodium chloride, sodium chloride, acetaminophen, HYDROcodone-acetaminophen, loratadine, ondansetron (ZOFRAN) IV, mouth rinse, sodium chloride flush, sodium chloride flush   Vital Signs    Vitals:   10/18/21 2337 10/18/21 2350 10/19/21 0324 10/19/21 0533  BP: 101/76  96/74   Pulse: 67 71 (!) 55   Resp: '17 18 18   '$ Temp: 99 F (37.2 C)  97.8 F (36.6 C)   TempSrc: Oral  Oral   SpO2: 99% 95% 96%   Weight:    86 kg  Height:        Intake/Output Summary (Last 24 hours) at 10/19/2021 1115 Last data filed at 10/19/2021 0833 Gross per 24 hour  Intake 455.58 ml  Output 3350 ml  Net -2894.42 ml    I/O since admission: -6700.5  Filed Weights   10/17/21 0530 10/18/21 0545 10/19/21 0533  Weight: 86.6 kg 86.8 kg 86 kg    Telemetry    Patient just completed walking with rehab. Upon returning to his room  telemetry showed frequent PVCs in a bigeminal rhythm.  Now he is having just rare to occasional isolated PVC with underlying sinus rhythm in the 60s.  ECG    10/16/2021 ECG (independently read by me): NSR at 64, NST wave abnormality  Physical Exam   BP 96/74 (BP Location: Right Arm)   Pulse (!) 55   Temp 97.8 F (36.6 C) (Oral)   Resp 18   Ht '5\' 10"'$  (1.778 m)   Wt 86 kg   SpO2 96%   BMI 27.20 kg/m  General: Alert,  oriented, no distress.  Skin: normal turgor, no rashes, warm and dry HEENT: Normocephalic, atraumatic. Pupils equal round and reactive to light; sclera anicteric; extraocular muscles intact;  Nose without nasal septal hypertrophy Mouth/Parynx benign; Mallinpatti scale 3 Neck: hematoma R neck;  Lungs: clear to ausculatation and percussion; no wheezing or rales Chest wall: without tenderness to palpitation Heart: PMI not displaced, RRR, s1 s2 normal, 1/6 early peaking systolic murmur, no diastolic murmur, no rubs, gallops, thrills, or heaves Abdomen: soft, nontender; no hepatosplenomehaly, BS+; abdominal aorta nontender and not dilated by palpation. Back: no CVA tenderness Pulses 2+ Musculoskeletal: full range of motion, normal strength, no joint deformities Extremities: no clubbing cyanosis or edema, Homan's sign negative  Neurologic: grossly nonfocal; Cranial nerves grossly wnl Psychologic: Normal mood and affect     Labs    Chemistry Recent Labs  Lab 10/13/21 0510 10/13/21 0845 10/15/21 0639 10/16/21 0557 10/17/21 0755  NA 139   < > 142 137 136  K 4.1   < > 4.4 4.0 3.8  CL 104   < > 108 105 105  CO2 25   < > 26 21*  23  GLUCOSE 123*   < > 103* 108* 181*  BUN 16   < > '11 12 12  '$ CREATININE 1.87*   < > 1.28* 1.35* 1.41*  CALCIUM 9.9   < > 9.3 9.1 9.2  PROT 6.9  --   --   --   --   ALBUMIN 4.2  --  3.7 3.4* 3.6  AST 30  --   --   --   --   ALT 19  --   --   --   --   ALKPHOS 45  --   --   --   --   BILITOT 0.7  --   --   --   --   GFRNONAA 37*   < > 58* 54* 52*  ANIONGAP 10   < > '8 11 8   '$ < > = values in this interval not displayed.     Hematology Recent Labs  Lab 10/17/21 0755 10/18/21 0822 10/19/21 0630  WBC 8.4 8.2 9.6  RBC 4.55 4.87 4.55  HGB 13.4 14.3 13.4  HCT 39.0 41.7 38.8*  MCV 85.7 85.6 85.3  MCH 29.5 29.4 29.5  MCHC 34.4 34.3 34.5  RDW 14.8 14.8 14.6  PLT 339 400 387    Cardiac Enzymes  Lab 10/13/21 0714 10/13/21 0836 10/13/21 1150  10/13/21 1327 10/13/21 1854  TROPONINIHS 397* 1,101* 9,868* 9,340* 15,166*        BNPNo results for input(s): "BNP", "PROBNP" in the last 168 hours.   DDimer No results for input(s): "DDIMER" in the last 168 hours.   Lipid Panel     Component Value Date/Time   CHOL 135 10/14/2021 0623   TRIG 164 (H) 10/14/2021 0623   HDL 42 10/14/2021 0623   CHOLHDL 3.2 10/14/2021 0623   VLDL 33 10/14/2021 0623   LDLCALC 60 10/14/2021 0623   LDLDIRECT 30.0 11/10/2020 1018     Radiology    No results found.  Cardiac Studies   Limited echocardiogram November 14, 2021 1. The aortic valve is calcified. There is severe calcifcation of the aortic valve. Aortic valve regurgitation is not visualized. Mild aortic valve stenosis. Aortic valve area, by VTI measures 1.92 cm. Aortic valve mean gradient measures 9.0 mmHg. Aortic valve Vmax measures 1.89 m/s. Conclusion(s)/Recommendation(s): Limited study to assess aortic valve stenosis. Stenosis is mild visually, by gradients, by dimensionless index, and by valve area.    Echocardiogram 10/13/2021  1. Left ventricular ejection fraction, by estimation, is 60 to 65%. The  left ventricle has normal function. The left ventricle demonstrates  regional wall motion abnormalities with basal inferior and basal  inferolateral severe hypokinesis. There is mild  concentric left ventricular hypertrophy. Left ventricular diastolic  parameters are consistent with Grade II diastolic dysfunction  (pseudonormalization).   2. Right ventricular systolic function is normal. The right ventricular  size is normal. Tricuspid regurgitation signal is inadequate for assessing  PA pressure.   3. The mitral valve is normal in structure. Mild mitral valve  regurgitation. No evidence of mitral stenosis.   4. The aortic valve is tricuspid. There is severe calcification of the  aortic valve. Aortic valve regurgitation is not visualized. There appears  to be some degree of aortic stenosis  but the aortic valve was not fully  interrogated by doppler. Would  consider repeat limited echo with attention to aortic valve doppler  interrogation.   5. The inferior vena cava is normal in size with greater than 50%  respiratory variability, suggesting right  atrial pressure of 3 mmHg.    Cardiac catheterization 10/15/2021 Diagnostic Dominance: Right   Patient Profile     76 y.o. 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   Additional medical problems include paroxysmal atrial fibrillation, HTN, DM2, OSA, mild aortic stenosis  Assessment & Plan    1. CAD: Severe calcified LAD  treated with low level PTCA and unsuccessful attempt at atherectomy of LAD and reperfusion of RPLV branch. No recurrent angina symptoms. Currently on brilinta washout; no recurrent anginal symptomatology.  He continues to be on IV heparin therapy currently at 1400 units/h with goal of therapy between 0.3 and 0.7 units/mL. 2. Mild aortic stenosis; peak gradient 14.3 on echo. 3. Grade 2 diastolic dysfunction. 4.  Frequent PVCs.  These seem to be exacerbated following his walk.  Patient is asymptomatic.  Blood pressure is low at 98/74.  Continue to monitor.  Currently not on low-dose beta-blocker but depending upon to be on blood pressure consider possible very low-dose initiation if needed 5. PAF: Maintaining sinus rhythm presently with PVCs as noted above.  No recurrent atrial fibrillation.  He is anticoagulated on heparin.  Plan for LA appendage's occlusion/question maze procedure at CABG surgery.   6. OSA on CPAP, using at night in hospital 7. DM type 2; consider plan for future GLP-1 agonist/ SGLT2 inhibitors with cardiovascular  benefit. 8. HLP:   currently on atorvastatin 80 mg; LDL 60, on low dose fenofibrate TG 160.  Tentative surgical date is set for Friday, 11/21/2021    Signed, Troy Sine, MD, [facility] 10/19/2021, 11:15 AM

## 2021-10-19 NOTE — Progress Notes (Signed)
Mobility Specialist Progress Note    10/19/21 1125  Mobility  Activity Ambulated with assistance in hallway  Level of Assistance Standby assist, set-up cues, supervision of patient - no hands on  Assistive Device Other (Comment) (IV pole)  Distance Ambulated (ft) 800 ft  Activity Response Tolerated well  $Mobility charge 1 Mobility   Pre-Mobility: 55 HR, 97/76 BP, 97% SpO2 Post-Mobility: 63 HR  Pt received in chair and agreeable. No complaints on walk. Returned to chair with call bell in reach.    Hildred Alamin Mobility Specialist

## 2021-10-19 NOTE — Progress Notes (Signed)
Patient to self-administer CPAP. Patient is familiar with equipment and procedure and was advised to contact RT with any concerns or needs.

## 2021-10-19 NOTE — Plan of Care (Signed)
  Problem: Clinical Measurements: Goal: Diagnostic test results will improve Outcome: Progressing Goal: Respiratory complications will improve Outcome: Progressing   Problem: Activity: Goal: Risk for activity intolerance will decrease Outcome: Progressing   Problem: Nutrition: Goal: Adequate nutrition will be maintained Outcome: Progressing   Problem: Coping: Goal: Level of anxiety will decrease Outcome: Progressing   Problem: Elimination: Goal: Will not experience complications related to bowel motility Outcome: Progressing Goal: Will not experience complications related to urinary retention Outcome: Progressing   Problem: Pain Managment: Goal: General experience of comfort will improve Outcome: Progressing   Problem: Safety: Goal: Ability to remain free from injury will improve Outcome: Progressing

## 2021-10-19 NOTE — Progress Notes (Signed)
CARDIAC REHAB PHASE I   Pt sitting in chair. He is feeling well this morning. He is tolerating ambulation in room without problem. Reviewed pre-[facility] education provided yesterday. All questions and concerns addressed. Will continue to follow.   [id number]  Vanessa Barbara, RN BSN 10/19/2021 9:12 AM

## 2021-10-19 NOTE — Progress Notes (Signed)
[place] for heparin Indication: atrial fibrillation  No Known Allergies  Patient Measurements: Height: '5\' 10"'$  (177.8 cm) Weight: 86 kg (189 lb 9.5 oz) IBW/kg (Calculated) : 73 Heparin Dosing Weight: 86.2 kg  Vital Signs: Temp: 97.8 F (36.6 C) (09/19 0324) Temp Source: Oral (09/19 0324) BP: 96/74 (09/19 0324) Pulse Rate: 55 (09/19 0324)  Labs: Recent Labs    10/17/21 0755 10/18/21 0822 10/19/21 0630  HGB 13.4 14.3 13.4  HCT 39.0 41.7 38.8*  PLT 339 400 387  HEPARINUNFRC 0.39 0.28* 0.36  CREATININE 1.41*  --   --      Estimated Creatinine Clearance: 46 mL/min (A) (by C-G formula based on SCr of 1.41 mg/dL (H)).   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 pylori infection 2011   Heart murmur    History of radiation therapy 09/20/10 thru 10/29/10   gastroesophageal junction/gastric cardia   Hypertension    Neuromuscular disorder ([place])    Other and unspecified hyperlipidemia    Sleep apnea    On CPAP   Status post chemotherapy 09/22/10 thru 10/27/10   concurrent w/radiation   Unspecified sleep apnea     Medications:  Infusions:   sodium chloride     sodium chloride     heparin 1,400 Units/hr (10/19/21 0450)    Assessment: 76 yo male with CAD planning CABG after ticagrelor washout (LD 9/15 am). Pharmacy asked to restart heparin s/p cath for recurrent  afib (now NSR). Tentative CABG on 9/22  Heparin drip at 1400 units/hr  with heparin level at goal    Goal of Therapy:  Heparin level 0.3-0.7 units/ml Monitor platelets by anticoagulation protocol: Yes   Plan:  -Continue heparin at  1400 units/hr -Daily heparin level and CBC  Hildred Laser, PharmD Clinical Pharmacist **Pharmacist phone directory can now be found on [email or web address] ([email or web address]).  Listed under [place].

## 2021-10-20 ENCOUNTER — Inpatient Hospital Stay (HOSPITAL_BASED_OUTPATIENT_CLINIC_OR_DEPARTMENT_OTHER): Payer: Medicare Other

## 2021-10-20 DIAGNOSIS — I4891 Unspecified atrial fibrillation: Secondary | ICD-10-CM

## 2021-10-20 DIAGNOSIS — I2510 Atherosclerotic heart disease of native coronary artery without angina pectoris: Secondary | ICD-10-CM | POA: Diagnosis not present

## 2021-10-20 DIAGNOSIS — G4733 Obstructive sleep apnea (adult) (pediatric): Secondary | ICD-10-CM

## 2021-10-20 DIAGNOSIS — I200 Unstable angina: Secondary | ICD-10-CM | POA: Diagnosis not present

## 2021-10-20 DIAGNOSIS — E119 Type 2 diabetes mellitus without complications: Secondary | ICD-10-CM

## 2021-10-20 DIAGNOSIS — Z01810 Encounter for preprocedural cardiovascular examination: Secondary | ICD-10-CM

## 2021-10-20 DIAGNOSIS — I2119 ST elevation (STEMI) myocardial infarction involving other coronary artery of inferior wall: Principal | ICD-10-CM

## 2021-10-20 DIAGNOSIS — N170 Acute kidney failure with tubular necrosis: Secondary | ICD-10-CM

## 2021-10-20 DIAGNOSIS — Z9989 Dependence on other enabling machines and devices: Secondary | ICD-10-CM

## 2021-10-20 DIAGNOSIS — I480 Paroxysmal atrial fibrillation: Secondary | ICD-10-CM | POA: Diagnosis not present

## 2021-10-20 DIAGNOSIS — R9431 Abnormal electrocardiogram [ECG] [EKG]: Secondary | ICD-10-CM | POA: Diagnosis not present

## 2021-10-20 DIAGNOSIS — I493 Ventricular premature depolarization: Secondary | ICD-10-CM

## 2021-10-20 DIAGNOSIS — I959 Hypotension, unspecified: Secondary | ICD-10-CM

## 2021-10-20 DIAGNOSIS — E1159 Type 2 diabetes mellitus with other circulatory complications: Secondary | ICD-10-CM | POA: Diagnosis not present

## 2021-10-20 LAB — CBC
HCT: 40.6 % (ref 39.0–52.0)
Hemoglobin: 13.9 g/dL (ref 13.0–17.0)
MCH: 29.3 pg (ref 26.0–34.0)
MCHC: 34.2 g/dL (ref 30.0–36.0)
MCV: 85.5 fL (ref 80.0–100.0)
Platelets: 409 10*3/uL — ABNORMAL HIGH (ref 150–400)
RBC: 4.75 MIL/uL (ref 4.22–5.81)
RDW: 14.8 % (ref 11.5–15.5)
WBC: 8.7 10*3/uL (ref 4.0–10.5)
nRBC: 0 % (ref 0.0–0.2)

## 2021-10-20 LAB — GLUCOSE, CAPILLARY
Glucose-Capillary: 96 mg/dL (ref 70–99)
Glucose-Capillary: 95 mg/dL (ref 70–99)
Glucose-Capillary: 80 mg/dL (ref 70–99)
Glucose-Capillary: 110 mg/dL — ABNORMAL HIGH (ref 70–99)

## 2021-10-20 LAB — HEPARIN LEVEL (UNFRACTIONATED): Heparin Unfractionated: 0.41 IU/mL (ref 0.30–0.70)

## 2021-10-20 NOTE — Progress Notes (Signed)
Pre cabg has been completed.   Preliminary results in CV Proc.   Tinisha Etzkorn Zeidy Tayag 10/20/2021 2:05 PM

## 2021-10-20 NOTE — Care Management Important Message (Signed)
Important Message  Patient Details  Name: Jon [name] MRN: [id number] Date of Birth: Nov 20, 1945   Medicare Important Message Given:  Yes     [name] 10/20/2021, 2:47 PM

## 2021-10-20 NOTE — Plan of Care (Signed)
  Problem: Education: Goal: Knowledge of condition and prescribed therapy will improve Outcome: Progressing   Problem: Cardiac: Goal: Will achieve and/or maintain adequate cardiac output Outcome: Progressing   Problem: Physical Regulation: Goal: Complications related to the disease process, condition or treatment will be avoided or minimized Outcome: Progressing   Problem: Education: Goal: Ability to describe self-care measures that may prevent or decrease complications (Diabetes Survival Skills Education) will improve Outcome: Progressing Goal: Individualized Educational Video(s) Outcome: Progressing   Problem: Coping: Goal: Ability to adjust to condition or change in health will improve Outcome: Progressing   Problem: Fluid Volume: Goal: Ability to maintain a balanced intake and output will improve Outcome: Progressing   Problem: Health Behavior/Discharge Planning: Goal: Ability to identify and utilize available resources and services will improve Outcome: Progressing Goal: Ability to manage health-related needs will improve Outcome: Progressing   Problem: Metabolic: Goal: Ability to maintain appropriate glucose levels will improve Outcome: Progressing   Problem: Nutritional: Goal: Maintenance of adequate nutrition will improve Outcome: Progressing Goal: Progress toward achieving an optimal weight will improve Outcome: Progressing   Problem: Skin Integrity: Goal: Risk for impaired skin integrity will decrease Outcome: Progressing   Problem: Tissue Perfusion: Goal: Adequacy of tissue perfusion will improve Outcome: Progressing   Problem: Education: Goal: Understanding of CV disease, CV risk reduction, and recovery process will improve Outcome: Progressing Goal: Individualized Educational Video(s) Outcome: Progressing   Problem: Activity: Goal: Ability to return to baseline activity level will improve Outcome: Progressing   Problem: Cardiovascular: Goal:  Ability to achieve and maintain adequate cardiovascular perfusion will improve Outcome: Progressing Goal: Vascular access site(s) Level 0-1 will be maintained Outcome: Progressing   Problem: Health Behavior/Discharge Planning: Goal: Ability to safely manage health-related needs after discharge will improve Outcome: Progressing   Problem: Education: Goal: Knowledge of General Education information will improve Description: Including pain rating scale, medication(s)/side effects and non-pharmacologic comfort measures Outcome: Progressing   Problem: Health Behavior/Discharge Planning: Goal: Ability to manage health-related needs will improve Outcome: Progressing   Problem: Clinical Measurements: Goal: Ability to maintain clinical measurements within normal limits will improve Outcome: Progressing Goal: Will remain free from infection Outcome: Progressing Goal: Diagnostic test results will improve Outcome: Progressing Goal: Respiratory complications will improve Outcome: Progressing Goal: Cardiovascular complication will be avoided Outcome: Progressing   Problem: Activity: Goal: Risk for activity intolerance will decrease Outcome: Progressing   Problem: Nutrition: Goal: Adequate nutrition will be maintained Outcome: Progressing   Problem: Coping: Goal: Level of anxiety will decrease Outcome: Progressing   Problem: Elimination: Goal: Will not experience complications related to bowel motility Outcome: Progressing Goal: Will not experience complications related to urinary retention Outcome: Progressing   Problem: Pain Managment: Goal: General experience of comfort will improve Outcome: Progressing

## 2021-10-20 NOTE — Progress Notes (Signed)
Pt states he will self administer CPAP when he is ready for rest. 

## 2021-10-20 NOTE — Progress Notes (Signed)
PROGRESS NOTE  Jon [name] AST:[id number] DOB: 09/18/1945   PCP: [name], MD  Patient is from: Home.    DOA: 10/13/2021 LOS: 7  Brief Narrative / Interim history: 76 year old Male with PMH of DM-2, OSA on CPAP, paroxysmal A-fib, GEJ adenocarcinoma s/p XRT in 2012 and diverticulosis presented to the hospital with new onset chest pain and near syncope associated with lightheadedness and blurry vision, and found to have inferior STEMI, bradycardia, hypotension and AKI.  Initial EKG showed atrial flutter with various transduction. Troponin trended from 11-15,000.  Subsequent EKG concerning for inferior ST elevation.  Cardiology was consulted. TTE with LVEF of 60 to 65%, regional wal motion abnormality and G2-DD. CT chest with significant chronic calcification.  LHC with occluded posterolateral branch felt to be the culprit lesion but flow could not be restored with balloon angioplasty.  LHC also showed severe serial mid LAD lesions that that could not be addressed by staged PCI.  Cardiothoracic surgery was consulted.   At this time, patient is awaiting for plan for CABG and ligation of LA appendage  10/22/21 after Brilinta washout.  Last dose of Brilinta the morning of 9/15.  Subjective: Today, patient was seen and examined at bedside.  Patient denies any chest pain, shortness of breath, nausea, vomiting, fever or chills. Objective: Vitals:   10/19/21 2340 10/20/21 0340 10/20/21 0624 10/20/21 0746  BP: 93/68 95/68  94/68  Pulse: (!) 54 (!) 50    Resp: '14 15  16  '$ Temp: 98.6 F (37 C) 97.6 F (36.4 C)    TempSrc: Oral Oral    SpO2: 100% 97%    Weight:   86 kg   Height:        Examination: Body mass index is 27.2 kg/m.  General:  Average built, not in obvious distress HENT:   No scleral pallor or icterus noted. Oral mucosa is moist.  Chest:  Clear breath sounds.  Diminished breath sounds bilaterally. No crackles or wheezes.  CVS: S1 &S2 heard. No murmur.  Regular rate  and rhythm. Abdomen: Soft, nontender, nondistended.  Bowel sounds are heard.   Extremities: No cyanosis, clubbing or edema.  Peripheral pulses are palpable. Psych: Alert, awake and oriented, normal mood CNS:  No cranial nerve deficits.  Power equal in all extremities.   Skin: Warm and dry.  Ecchymosis over the right neck and right lateral chest.  Procedures:  9/13-LHC with occluded posterolateral branch felt to be the culprit lesion but flow could not be restored with balloon angioplasty.  LHC also showed severe serial mid LAD lesions.    Assessment and plan: Principal Problem:   Inferior ST segment elevation Active Problems:   CHEST PAIN   PAF (paroxysmal atrial fibrillation) (HCC)   Primary adenocarcinoma of GE junction   Diabetes mellitus, type 2 (HCC)   Coronary artery disease   New onset a-fib (HCC)   Near syncope   Syncope   Non-ST elevation (NSTEMI) myocardial infarction (HCC)  Inferior STEMI/CAD:  Significantly elevated troponin with EKG suspicion for inferior ST elevation MI.  2D echocardiogram with LVEF of 60 to 65%, RWMA and G2 DD.  CT chest with significant coronary calcification.  LHC with occluded posterolateral branch and severe serial mid LAD lesions.  PCI was unsuccessful.  Thoracic surgery was consulted.  Plan for CABG after Brilinta washout on Friday after Brilinta washout.  Last dose 9/15. -Continue high intensity statin, low-dose aspirin and IV heparin.  Sinus symptomatic bradycardia/syncope:  No further dizziness still bradycardic.  Paroxysmal A-fib/flutter: CHA2DS2-VASc score > 3.  Currently in sinus rhythm. Cardiothoracic surgery is planning for ligation of LA appendage during CABG  Hypotension: Still borderline low.  Asymptomatic.  AKI: Likely due to hypotension.  Improved.  Latest creatinine of 1.1.  Uncontrolled NIDDM-2: Patient hemoglobin A1c 7.1% on 09/02/2021.Continue sliding scale insulin statins.    OSA on CPAP -CPAP at bedtime  DVT  prophylaxis:   IV heparin  Code Status: Full code  Family Communication: None at bedside.  Level of care: Telemetry Cardiac  Status is: Inpatient Remains inpatient appropriate because: STEMI waiting on CABG  Final disposition: Likely home after CABG  Consultants:  Cardiology CVTS  Sch Meds:  Scheduled Meds:  aspirin  81 mg Oral Daily   atorvastatin  80 mg Oral Daily   atropine  0.5 mg Intravenous Once   fenofibrate  54 mg Oral Daily   insulin aspart  0-15 Units Subcutaneous TID WC   pantoprazole  40 mg Oral Daily   sodium chloride flush  3 mL Intravenous Q12H   sodium chloride flush  3 mL Intravenous Q12H   Continuous Infusions:  sodium chloride     sodium chloride     heparin 1,400 Units/hr (10/20/21 0030)   PRN Meds:.sodium chloride, sodium chloride, acetaminophen, HYDROcodone-acetaminophen, loratadine, ondansetron (ZOFRAN) IV, mouth rinse, sodium chloride flush, sodium chloride flush  Antimicrobials: Anti-infectives (From admission, onward)    None      I have personally reviewed the following labs and images: CBC: Recent Labs  Lab 10/16/21 0601 10/17/21 0755 10/18/21 0822 10/19/21 0630 10/20/21 0639  WBC 11.2* 8.4 8.2 9.6 8.7  HGB 13.0 13.4 14.3 13.4 13.9  HCT 37.7* 39.0 41.7 38.8* 40.6  MCV 86.3 85.7 85.6 85.3 85.5  PLT 286 339 400 387 409*    BMP &GFR Recent Labs  Lab 10/13/21 1327 10/13/21 1547 10/13/21 1628 10/14/21 0623 10/15/21 0639 10/16/21 0557 10/17/21 0755  NA 137   < > 139 140 142 137 136  K 4.6   < > 5.0 3.6 4.4 4.0 3.8  CL 108  --   --  106 108 105 105  CO2 22  --   --  24 26 21* 23  GLUCOSE 123*  --   --  130* 103* 108* 181*  BUN 13  --   --  '11 11 12 12  '$ CREATININE 1.49*  --   --  1.33* 1.28* 1.35* 1.41*  CALCIUM 8.6*  --   --  9.3 9.3 9.1 9.2  MG  --   --   --   --  1.9 1.9 1.9  PHOS  --   --   --   --  2.4* 2.9 3.3   < > = values in this interval not displayed.    Estimated Creatinine Clearance: 46 mL/min (A) (by  C-G formula based on SCr of 1.41 mg/dL (H)). Liver & Pancreas: Recent Labs  Lab 10/15/21 0639 10/16/21 0557 10/17/21 0755  ALBUMIN 3.7 3.4* 3.6    No results for input(s): "LIPASE", "AMYLASE" in the last 168 hours.  No results for input(s): "AMMONIA" in the last 168 hours. Diabetic: No results for input(s): "HGBA1C" in the last 72 hours. Recent Labs  Lab 10/19/21 0540 10/19/21 1144 10/19/21 1611 10/19/21 2133 10/20/21 0614  GLUCAP 105* 89 114* 178* 96    Cardiac Enzymes: No results for input(s): "CKTOTAL", "CKMB", "CKMBINDEX", "TROPONINI" in the last 168 hours. No results for input(s): "PROBNP" in the last 8760 hours. Coagulation Profile:  No results for input(s): "INR", "PROTIME" in the last 168 hours. Thyroid Function Tests: No results for input(s): "TSH", "T4TOTAL", "FREET4", "T3FREE", "THYROIDAB" in the last 72 hours.  Lipid Profile: No results for input(s): "CHOL", "HDL", "LDLCALC", "TRIG", "CHOLHDL", "LDLDIRECT" in the last 72 hours.  Anemia Panel: No results for input(s): "VITAMINB12", "FOLATE", "FERRITIN", "TIBC", "IRON", "RETICCTPCT" in the last 72 hours.  Urine analysis:    Component Value Date/Time   COLORURINE YELLOW 10/13/2021 0630   APPEARANCEUR CLEAR 10/13/2021 0630   LABSPEC 1.010 10/13/2021 0630   PHURINE 7.0 10/13/2021 0630   GLUCOSEU NEGATIVE 10/13/2021 0630   GLUCOSEU NEGATIVE 06/10/2019 0955   HGBUR NEGATIVE 10/13/2021 0630   BILIRUBINUR NEGATIVE 10/13/2021 0630   KETONESUR NEGATIVE 10/13/2021 0630   PROTEINUR NEGATIVE 10/13/2021 0630   UROBILINOGEN 1.0 06/10/2019 0955   NITRITE NEGATIVE 10/13/2021 0630   LEUKOCYTESUR NEGATIVE 10/13/2021 0630     Microbiology: Recent Results (from the past 240 hour(s))  MRSA Next Gen by PCR, Nasal     Status: None   Collection Time: 10/13/21  5:03 PM   Specimen: Nasal Mucosa; Nasal Swab  Result Value Ref Range Status   MRSA by PCR Next Gen NOT DETECTED NOT DETECTED Final    Comment: (NOTE) The  GeneXpert MRSA Assay (FDA approved for NASAL specimens only), is one component of a comprehensive MRSA colonization surveillance program. It is not intended to diagnose MRSA infection nor to guide or monitor treatment for MRSA infections. Test performance is not FDA approved in patients less than 46 years old. Performed at [facility], [place] [address], [place], [place] [address]     Radiology Studies: No results found.   Oscar La, MD [facility]  If 7PM-7AM, please contact night-coverage [email or web address] 10/20/2021, 10:24 AM

## 2021-10-20 NOTE — Progress Notes (Signed)
[facility] for heparin Indication: atrial fibrillation  No Known Allergies  Patient Measurements: Height: '5\' 10"'$  (177.8 cm) Weight: 86 kg (189 lb 9.5 oz) IBW/kg (Calculated) : 73 Heparin Dosing Weight: 86.2 kg  Vital Signs: Temp: 97.6 F (36.4 C) (09/20 0340) Temp Source: Oral (09/20 0340) BP: 94/68 (09/20 0746) Pulse Rate: 50 (09/20 0340)  Labs: Recent Labs    10/18/21 0822 10/19/21 0630 10/20/21 0639  HGB 14.3 13.4 13.9  HCT 41.7 38.8* 40.6  PLT 400 387 409*  HEPARINUNFRC 0.28* 0.36 0.41     Estimated Creatinine Clearance: 46 mL/min (A) (by C-G formula based on SCr of 1.41 mg/dL (H)).   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 pylori infection 2011   Heart murmur    History of radiation therapy 09/20/10 thru 10/29/10   gastroesophageal junction/gastric cardia   Hypertension    Neuromuscular disorder ([place])    Other and unspecified hyperlipidemia    Sleep apnea    On CPAP   Status post chemotherapy 09/22/10 thru 10/27/10   concurrent w/radiation   Unspecified sleep apnea     Medications:  Infusions:   sodium chloride     sodium chloride     heparin 1,400 Units/hr (10/20/21 1025)    Assessment: 76 yo male with CAD planning CABG after ticagrelor washout (LD 9/15 am). Pharmacy asked to restart heparin s/p cath for recurrent  afib (now NSR). Tentative CABG on 9/22  Heparin drip at 1400 units/hr  with heparin level at goal    Goal of Therapy:  Heparin level 0.3-0.7 units/ml Monitor platelets by anticoagulation protocol: Yes   Plan:  -Continue heparin at 1400 units/hr -Daily  heparin level and CBC  Hildred Laser, PharmD Clinical Pharmacist **Pharmacist phone directory can now be found on [email or web address] ([email or web address]).  Listed under [place].

## 2021-10-20 NOTE — Progress Notes (Signed)
Mobility Specialist Progress Note    10/20/21 1642  Mobility  Activity Ambulated independently in hallway  Level of Assistance Standby assist, set-up cues, supervision of patient - no hands on  Assistive Device Other (Comment) (IV pole)  Distance Ambulated (ft) 1200 ft  Activity Response Tolerated well  $Mobility charge 1 Mobility   Pre-Mobility: 61 HR During Mobility: 80 HR Post-Mobility: 74 HR  Pt received in chair and agreeable. No complaints on walk. Returned to chair with call bell in reach.    Hildred Alamin Mobility Specialist

## 2021-10-20 NOTE — Progress Notes (Signed)
Patient Name: Jon [name] Date of Encounter: 10/20/2021  Primary Cardiologist: Harrington Challenger  Subjective   No chest pain or shortness of breath.  Inpatient Medications    Scheduled Meds:  aspirin  81 mg Oral Daily   atorvastatin  80 mg Oral Daily   atropine  0.5 mg Intravenous Once   fenofibrate  54 mg Oral Daily   insulin aspart  0-15 Units Subcutaneous TID WC   pantoprazole  40 mg Oral Daily   sodium chloride flush  3 mL Intravenous Q12H   sodium chloride flush  3 mL Intravenous Q12H   Continuous Infusions:  sodium chloride     sodium chloride     heparin 1,400 Units/hr (10/20/21 1025)   PRN Meds: sodium chloride, sodium chloride, acetaminophen, HYDROcodone-acetaminophen, loratadine, ondansetron (ZOFRAN) IV, mouth rinse, sodium chloride flush, sodium chloride flush   Vital Signs    Vitals:   10/19/21 2340 10/20/21 0340 10/20/21 0624 10/20/21 0746  BP: 93/68 95/68  94/68  Pulse: (!) 54 (!) 50    Resp: '14 15  16  '$ Temp: 98.6 F (37 C) 97.6 F (36.4 C)    TempSrc: Oral Oral    SpO2: 100% 97%    Weight:   86 kg   Height:        Intake/Output Summary (Last 24 hours) at 10/20/2021 1137 Last data filed at 10/20/2021 1025 Gross per 24 hour  Intake 728.78 ml  Output 1800 ml  Net -1071.22 ml    I/O since admission: -7771.7  Filed Weights   10/18/21 0545 10/19/21 0533 10/20/21 0624  Weight: 86.8 kg 86 kg 86 kg    Telemetry    Sinus rhythm in the upper 60s with occasional isolated PVCs..  ECG    10/16/2021 ECG (independently read by me): NSR at 64, NST wave abnormality  Physical Exam   BP 94/68 (BP Location: Right Arm)   Pulse (!) 50   Temp 97.6 F (36.4 C) (Oral)   Resp 16   Ht '5\' 10"'$  (1.778 m)   Wt 86 kg   SpO2 97%   BMI 27.20 kg/m   Repeat BP by me was 101/72  General: Alert, oriented, no distress.  Skin: normal turgor, no rashes, warm and dry HEENT: Normocephalic, atraumatic. Pupils equal round and reactive to light; sclera anicteric;  extraocular muscles intact; Nose without nasal septal hypertrophy Mouth/Parynx benign; Mallinpatti scale Neck:  hematoma right neck, rightly improved, ecchymosis present. Lungs: clear to ausculatation and percussion; no wheezing or rales Chest wall: without tenderness to palpitation Heart: PMI not displaced, RRR, s1 s2 normal, 1/6 early peaking systolic murmur, no diastolic murmur, no rubs, gallops, thrills, or heaves Abdomen: soft, nontender; no hepatosplenomehaly, BS+; abdominal aorta nontender and not dilated by palpation. Back: no CVA tenderness Pulses 2+ Musculoskeletal: full range of motion, normal strength, no joint deformities Extremities: no clubbing cyanosis or edema, Homan's sign negative  Neurologic: grossly nonfocal; Cranial nerves grossly wnl Psychologic: Normal mood and affect   Labs    Chemistry Recent Labs  Lab 10/15/21 0639 10/16/21 0557 10/17/21 0755  NA 142 137 136  K 4.4 4.0 3.8  CL 108 105 105  CO2 26 21* 23  GLUCOSE 103* 108* 181*  BUN '11 12 12  '$ CREATININE 1.28* 1.35* 1.41*  CALCIUM 9.3 9.1 9.2  ALBUMIN 3.7 3.4* 3.6  GFRNONAA 58* 54* 52*  ANIONGAP '8 11 8     '$ Hematology Recent Labs  Lab 10/18/21 6010 10/19/21 0630 10/20/21 9323  WBC 8.2 9.6 8.7  RBC 4.87 4.55 4.75  HGB 14.3 13.4 13.9  HCT 41.7 38.8* 40.6  MCV 85.6 85.3 85.5  MCH 29.4 29.5 29.3  MCHC 34.3 34.5 34.2  RDW 14.8 14.6 14.8  PLT 400 387 409*    Cardiac Enzymes  Lab 10/13/21 0714 10/13/21 0836 10/13/21 1150 10/13/21 1327 10/13/21 1854  TROPONINIHS 397* 1,101* 9,868* 9,340* 15,166*        BNPNo results for input(s): "BNP", "PROBNP" in the last 168 hours.   DDimer No results for input(s): "DDIMER" in the last 168 hours.   Lipid Panel     Component Value Date/Time   CHOL 135 10/14/2021 0623   TRIG 164 (H) 10/14/2021 0623   HDL 42 10/14/2021 0623   CHOLHDL 3.2 10/14/2021 0623   VLDL 33 10/14/2021 0623   LDLCALC 60 10/14/2021 0623   LDLDIRECT 30.0 11/10/2020 1018      Radiology    No results found.  Cardiac Studies   Limited echocardiogram 11/02/21 1. The aortic valve is calcified. There is severe calcifcation of the aortic valve. Aortic valve regurgitation is not visualized. Mild aortic valve stenosis. Aortic valve area, by VTI measures 1.92 cm. Aortic valve mean gradient measures 9.0 mmHg. Aortic valve Vmax measures 1.89 m/s. Conclusion(s)/Recommendation(s): Limited study to assess aortic valve stenosis. Stenosis is mild visually, by gradients, by dimensionless index, and by valve area.    Echocardiogram 10/13/2021  1. Left ventricular ejection fraction, by estimation, is 60 to 65%. The  left ventricle has normal function. The left ventricle demonstrates  regional wall motion abnormalities with basal inferior and basal  inferolateral severe hypokinesis. There is mild  concentric left ventricular hypertrophy. Left ventricular diastolic  parameters are consistent with Grade II diastolic dysfunction  (pseudonormalization).   2. Right ventricular systolic function is normal. The right ventricular  size is normal. Tricuspid regurgitation signal is inadequate for assessing  PA pressure.   3. The mitral valve is normal in structure. Mild mitral valve  regurgitation. No evidence of mitral stenosis.   4. The aortic valve is tricuspid. There is severe calcification of the  aortic valve. Aortic valve regurgitation is not visualized. There appears  to be some degree of aortic stenosis but the aortic valve was not fully  interrogated by doppler. Would  consider repeat limited echo with attention to aortic valve doppler  interrogation.   5. The inferior vena cava is normal in size with greater than 50%  respiratory variability, suggesting right atrial pressure of 3 mmHg.    Cardiac catheterization 11-02-2021 Diagnostic Dominance: Right   Patient Profile     76 y.o. male with multivessel CAD, presenting with inferior STEMI underwent incomplete  PCI to RPLV (09/13), also unable to perform PCI for calcified and highly angulated proximal LAD 90% stenosis 2022-11-03), also has 80% mid LAD stenosis, 50% mid-RCA stenosis, with plan for CABG after brilinta washout tentatively scheduled for 10/22/21.   Additional medical problems include paroxysmal atrial fibrillation, HTN, DM2, OSA, mild aortic stenosis  Assessment & Plan    1. CAD: Severe calcified LAD  treated with low level PTCA and unsuccessful attempt at atherectomy of LAD and reperfusion of RPLV branch.  He has not had any recurrent anginal symptomatology.  He is on a Brilinta washout.  Tentative surgery has been scheduled for this Friday, October 22, 2021.  He continues to be on IV heparin at 1400 units/h with a goal of therapy between 0.3 and 0.7 units/mL. 2. Mild aortic stenosis; peak  gradient 14.3 on echo. 3. Grade 2 diastolic dysfunction. 4.  PVCs.  Patient continues to have occasional to frequent isolated unifocal PVCs.  Resting pulse is in the 60s.  His blood pressure has tended to be low with systolics in the 38L although now at 101.  If blood pressure allows and ectopy continues we will institute very low-dose beta-blocker therapy will but will defer presently.   5. PAF: Maintaining sinus rhythm presently with PVCs as noted above.  No recurrent atrial fibrillation.  He is anticoagulated on heparin.  Plan for LA appendage's occlusion/question maze procedure at CABG surgery.   6. OSA on CPAP, using at night in hospital 7. DM type 2; consider plan for future GLP-1 agonist/ SGLT2 inhibitors with cardiovascular benefit. 8. HLP:   currently on atorvastatin 80 mg; LDL 60, on low dose fenofibrate TG 160.     Signed, Troy Sine, MD, [facility] 10/20/2021, 11:37 AM

## 2021-10-21 ENCOUNTER — Inpatient Hospital Stay (HOSPITAL_COMMUNITY): Payer: Medicare Other

## 2021-10-21 DIAGNOSIS — R9431 Abnormal electrocardiogram [ECG] [EKG]: Secondary | ICD-10-CM | POA: Diagnosis not present

## 2021-10-21 DIAGNOSIS — R55 Syncope and collapse: Secondary | ICD-10-CM | POA: Diagnosis not present

## 2021-10-21 DIAGNOSIS — I214 Non-ST elevation (NSTEMI) myocardial infarction: Secondary | ICD-10-CM

## 2021-10-21 DIAGNOSIS — I4891 Unspecified atrial fibrillation: Secondary | ICD-10-CM

## 2021-10-21 DIAGNOSIS — I25110 Atherosclerotic heart disease of native coronary artery with unstable angina pectoris: Secondary | ICD-10-CM

## 2021-10-21 DIAGNOSIS — I2510 Atherosclerotic heart disease of native coronary artery without angina pectoris: Secondary | ICD-10-CM | POA: Diagnosis not present

## 2021-10-21 DIAGNOSIS — E1159 Type 2 diabetes mellitus with other circulatory complications: Secondary | ICD-10-CM | POA: Diagnosis not present

## 2021-10-21 DIAGNOSIS — I200 Unstable angina: Secondary | ICD-10-CM | POA: Diagnosis not present

## 2021-10-21 LAB — BLOOD GAS, ARTERIAL
Acid-Base Excess: 0.7 mmol/L (ref 0.0–2.0)
Bicarbonate: 24.3 mmol/L (ref 20.0–28.0)
Drawn by: 42783
O2 Saturation: 99.2 %
Patient temperature: 37
pCO2 arterial: 35 mmHg (ref 32–48)
pH, Arterial: 7.45 (ref 7.35–7.45)
pO2, Arterial: 91 mmHg (ref 83–108)

## 2021-10-21 LAB — CBC
HCT: 38.7 % — ABNORMAL LOW (ref 39.0–52.0)
HCT: 44.4 % (ref 39.0–52.0)
Hemoglobin: 13.1 g/dL (ref 13.0–17.0)
Hemoglobin: 14.9 g/dL (ref 13.0–17.0)
MCH: 29.4 pg (ref 26.0–34.0)
MCH: 29.3 pg (ref 26.0–34.0)
MCHC: 33.9 g/dL (ref 30.0–36.0)
MCHC: 33.6 g/dL (ref 30.0–36.0)
MCV: 87 fL (ref 80.0–100.0)
MCV: 87.4 fL (ref 80.0–100.0)
Platelets: 430 10*3/uL — ABNORMAL HIGH (ref 150–400)
Platelets: 486 10*3/uL — ABNORMAL HIGH (ref 150–400)
RBC: 4.45 MIL/uL (ref 4.22–5.81)
RBC: 5.08 MIL/uL (ref 4.22–5.81)
RDW: 14.7 % (ref 11.5–15.5)
RDW: 14.9 % (ref 11.5–15.5)
WBC: 9.5 10*3/uL (ref 4.0–10.5)
WBC: 8 10*3/uL (ref 4.0–10.5)
nRBC: 0 % (ref 0.0–0.2)
nRBC: 0 % (ref 0.0–0.2)

## 2021-10-21 LAB — COMPREHENSIVE METABOLIC PANEL
ALT: 29 U/L (ref 0–44)
AST: 30 U/L (ref 15–41)
Albumin: 4 g/dL (ref 3.5–5.0)
Alkaline Phosphatase: 49 U/L (ref 38–126)
Anion gap: 6 (ref 5–15)
BUN: 16 mg/dL (ref 8–23)
CO2: 27 mmol/L (ref 22–32)
Calcium: 9.8 mg/dL (ref 8.9–10.3)
Chloride: 105 mmol/L (ref 98–111)
Creatinine, Ser: 1.41 mg/dL — ABNORMAL HIGH (ref 0.61–1.24)
GFR, Estimated: 52 mL/min — ABNORMAL LOW (ref >60)
Glucose, Bld: 109 mg/dL — ABNORMAL HIGH (ref 70–99)
Potassium: 4 mmol/L (ref 3.5–5.1)
Sodium: 138 mmol/L (ref 135–145)
Total Bilirubin: 0.8 mg/dL (ref 0.3–1.2)
Total Protein: 7.3 g/dL (ref 6.5–8.1)

## 2021-10-21 LAB — GLUCOSE, CAPILLARY
Glucose-Capillary: 102 mg/dL — ABNORMAL HIGH (ref 70–99)
Glucose-Capillary: 70 mg/dL (ref 70–99)
Glucose-Capillary: 111 mg/dL — ABNORMAL HIGH (ref 70–99)

## 2021-10-21 LAB — PREPARE RBC (CROSSMATCH)

## 2021-10-21 LAB — ABO/RH: ABO/RH(D): O NEG

## 2021-10-21 LAB — HEPARIN LEVEL (UNFRACTIONATED): Heparin Unfractionated: 0.24 IU/mL — ABNORMAL LOW (ref 0.30–0.70)

## 2021-10-21 MED ORDER — MILRINONE LACTATE IN DEXTROSE 20-5 MG/100ML-% IV SOLN
0.3000 ug/kg/min | INTRAVENOUS | Status: DC
  Filled 2021-10-21: qty 100

## 2021-10-21 MED ORDER — ALPRAZOLAM 0.25 MG PO TABS: 0.2500 mg | ORAL_TABLET | ORAL | Status: DC | PRN

## 2021-10-21 MED ORDER — HEPARIN 30,000 UNITS/1000 ML (OHS) CELLSAVER SOLUTION
Status: DC
  Filled 2021-10-21: qty 1000

## 2021-10-21 MED ORDER — CEFAZOLIN SODIUM-DEXTROSE 2-4 GM/100ML-% IV SOLN
2.0000 g | INTRAVENOUS | Status: AC
  Administered 2021-10-22: 2 g via INTRAVENOUS
  Filled 2021-10-21: qty 100

## 2021-10-21 MED ORDER — DEXMEDETOMIDINE HCL IN NACL 400 MCG/100ML IV SOLN
0.1000 ug/kg/h | INTRAVENOUS | Status: AC
  Administered 2021-10-22: .7 ug/kg/h via INTRAVENOUS
  Filled 2021-10-21: qty 100

## 2021-10-21 MED ORDER — PLASMA-LYTE A IV SOLN
INTRAVENOUS | Status: DC
  Filled 2021-10-21: qty 2.5

## 2021-10-21 MED ORDER — INSULIN REGULAR(HUMAN) IN NACL 100-0.9 UT/100ML-% IV SOLN
INTRAVENOUS | Status: AC
  Administered 2021-10-22: 2.8 [IU]/h via INTRAVENOUS
  Filled 2021-10-21: qty 100

## 2021-10-21 MED ORDER — METOPROLOL TARTRATE 12.5 MG HALF TABLET
12.5000 mg | ORAL_TABLET | Freq: Once | ORAL | Status: DC
  Filled 2021-10-21: qty 1

## 2021-10-21 MED ORDER — NOREPINEPHRINE 4 MG/250ML-% IV SOLN
0.0000 ug/min | INTRAVENOUS | Status: DC
  Filled 2021-10-21: qty 250

## 2021-10-21 MED ORDER — TRANEXAMIC ACID 1000 MG/10ML IV SOLN
1.5000 mg/kg/h | INTRAVENOUS | Status: AC
  Administered 2021-10-22: 1.5 mg/kg/h via INTRAVENOUS
  Filled 2021-10-21: qty 25

## 2021-10-21 MED ORDER — PHENYLEPHRINE HCL-NACL 20-0.9 MG/250ML-% IV SOLN
30.0000 ug/min | INTRAVENOUS | Status: AC
  Administered 2021-10-22: 40 ug/min via INTRAVENOUS
  Filled 2021-10-21: qty 250

## 2021-10-21 MED ORDER — MANNITOL 20 % IV SOLN
INTRAVENOUS | Status: DC
  Filled 2021-10-21: qty 13

## 2021-10-21 MED ORDER — CHLORHEXIDINE GLUCONATE CLOTH 2 % EX PADS
6.0000 | MEDICATED_PAD | Freq: Once | CUTANEOUS | Status: AC
  Administered 2021-10-21: 6 via TOPICAL

## 2021-10-21 MED ORDER — VANCOMYCIN HCL 1000 MG IV SOLR
INTRAVENOUS | Status: DC
  Filled 2021-10-21: qty 20

## 2021-10-21 MED ORDER — TRANEXAMIC ACID (OHS) PUMP PRIME SOLUTION
2.0000 mg/kg | INTRAVENOUS | Status: DC
  Filled 2021-10-21: qty 1.71

## 2021-10-21 MED ORDER — CHLORHEXIDINE GLUCONATE CLOTH 2 % EX PADS
6.0000 | MEDICATED_PAD | Freq: Once | CUTANEOUS | Status: AC
  Administered 2021-10-22: 6 via TOPICAL

## 2021-10-21 MED ORDER — TEMAZEPAM 15 MG PO CAPS: 15.0000 mg | ORAL_CAPSULE | Freq: Once | ORAL | Status: DC | PRN

## 2021-10-21 MED ORDER — POTASSIUM CHLORIDE 2 MEQ/ML IV SOLN
80.0000 meq | INTRAVENOUS | Status: DC
  Filled 2021-10-21: qty 40

## 2021-10-21 MED ORDER — BISACODYL 5 MG PO TBEC
5.0000 mg | DELAYED_RELEASE_TABLET | Freq: Once | ORAL | Status: AC
  Administered 2021-10-21: 5 mg via ORAL
  Filled 2021-10-21 (×2): qty 1

## 2021-10-21 MED ORDER — NITROGLYCERIN IN D5W 200-5 MCG/ML-% IV SOLN
2.0000 ug/min | INTRAVENOUS | Status: DC
  Filled 2021-10-21: qty 250

## 2021-10-21 MED ORDER — EPINEPHRINE HCL 5 MG/250ML IV SOLN IN NS
0.0000 ug/min | INTRAVENOUS | Status: AC
  Administered 2021-10-22: 2 ug/min via INTRAVENOUS
  Filled 2021-10-21: qty 250

## 2021-10-21 MED ORDER — TRANEXAMIC ACID (OHS) BOLUS VIA INFUSION
15.0000 mg/kg | INTRAVENOUS | Status: AC
  Administered 2021-10-22: 1282.5 mg via INTRAVENOUS
  Filled 2021-10-21: qty 1283

## 2021-10-21 MED ORDER — DIAZEPAM 2 MG PO TABS
2.0000 mg | ORAL_TABLET | Freq: Once | ORAL | Status: AC
  Administered 2021-10-22: 2 mg via ORAL
  Filled 2021-10-21: qty 1

## 2021-10-21 MED ORDER — CHLORHEXIDINE GLUCONATE 0.12 % MT SOLN
15.0000 mL | Freq: Once | OROMUCOSAL | Status: AC
  Administered 2021-10-22: 15 mL via OROMUCOSAL
  Filled 2021-10-21: qty 15

## 2021-10-21 MED ORDER — VANCOMYCIN HCL 1500 MG/300ML IV SOLN
1500.0000 mg | INTRAVENOUS | Status: AC
  Administered 2021-10-22: 1500 mg via INTRAVENOUS
  Filled 2021-10-21: qty 300

## 2021-10-21 NOTE — Progress Notes (Signed)
Mobility Specialist Progress Note    10/21/21 1134  Mobility  Activity Ambulated independently in hallway  Level of Assistance Independent after set-up  Assistive Device Other (Comment) (IV pole)  Distance Ambulated (ft) 1600 ft  Activity Response Tolerated well  $Mobility charge 1 Mobility   Pre-Mobility: 51 HR During Mobility: 110 HR Post-Mobility: 86 HR  Pt received in bed and agreeable. No complaints on walk. Returned to BR.    Hildred Alamin Mobility Specialist

## 2021-10-21 NOTE — Progress Notes (Signed)
Patient Name: Jon [name] Date of Encounter: 10/21/2021  Primary Cardiologist: Harrington Challenger  Subjective   Feels well today, resting comfortably, still in bed.  Inpatient Medications    Scheduled Meds:  aspirin  81 mg Oral Daily   atorvastatin  80 mg Oral Daily   atropine  0.5 mg Intravenous Once   fenofibrate  54 mg Oral Daily   insulin aspart  0-15 Units Subcutaneous TID WC   pantoprazole  40 mg Oral Daily   sodium chloride flush  3 mL Intravenous Q12H   sodium chloride flush  3 mL Intravenous Q12H   Continuous Infusions:  sodium chloride     sodium chloride     heparin 1,400 Units/hr (10/20/21 2014)   PRN Meds: sodium chloride, sodium chloride, acetaminophen, HYDROcodone-acetaminophen, loratadine, ondansetron (ZOFRAN) IV, mouth rinse, sodium chloride flush, sodium chloride flush   Vital Signs    Vitals:   10/21/21 0006 10/21/21 0316 10/21/21 0633 10/21/21 0801  BP: (!) 120/94 94/69  90/68  Pulse: 78 (!) 51    Resp: '19 16  16  '$ Temp: 98.6 F (37 C) 98.1 F (36.7 C)  98.5 F (36.9 C)  TempSrc: Oral Oral    SpO2: 98% 99%    Weight:   85.5 kg   Height:        Intake/Output Summary (Last 24 hours) at 10/21/2021 0824 Last data filed at 10/21/2021 0612 Gross per 24 hour  Intake 594.96 ml  Output 2375 ml  Net -1780.04 ml    I/O since admission: -10,540  Filed Weights   10/19/21 0533 10/20/21 0624 10/21/21 0633  Weight: 86 kg 86 kg 85.5 kg    Telemetry    Currently sinus bradycardia at 56 bpm without ectopy.  Patient has been resting in bed.  ECG    We will repeat ECG today prior to planned surgery tomorrow  10/16/2021 ECG (independently read by me): NSR at 64, NST wave abnormality  Physical Exam   BP 90/68 (BP Location: Right Arm)   Pulse (!) 51   Temp 98.5 F (36.9 C)   Resp 16   Ht '5\' 10"'$  (1.778 m)   Wt 85.5 kg   SpO2 99%   BMI 27.05 kg/m  General: Alert, oriented, no distress.  Skin: normal turgor, no rashes, warm and dry HEENT:  Normocephalic, atraumatic. Pupils equal round and reactive to light; sclera anicteric; extraocular muscles intact;  Nose without nasal septal hypertrophy Mouth/Parynx benign; Mallinpatti scale 3 Neck: No JVD, no carotid bruits; normal carotid upstroke Lungs: clear to ausculatation and percussion; no wheezing or rales Chest wall: without tenderness to palpitation Heart: PMI not displaced, RRR, s1 s2 normal, 2/6 early peaking LSB systolic murmur, no diastolic murmur, no rubs, gallops, thrills, or heaves Abdomen: soft, nontender; no hepatosplenomehaly, BS+; abdominal aorta nontender and not dilated by palpation. Back: no CVA tenderness Pulses 2+ Musculoskeletal: full range of motion, normal strength, no joint deformities Extremities: no clubbing cyanosis or edema, Homan's sign negative  Neurologic: grossly nonfocal; Cranial nerves grossly wnl Psychologic: Normal mood and affect    Labs    Chemistry Recent Labs  Lab 10/15/21 0639 10/16/21 0557 10/17/21 0755  NA 142 137 136  K 4.4 4.0 3.8  CL 108 105 105  CO2 26 21* 23  GLUCOSE 103* 108* 181*  BUN '11 12 12  '$ CREATININE 1.28* 1.35* 1.41*  CALCIUM 9.3 9.1 9.2  ALBUMIN 3.7 3.4* 3.6  GFRNONAA 58* 54* 52*  ANIONGAP 8 11 8  Hematology Recent Labs  Lab 10/19/21 0630 10/20/21 0639 10/21/21 0410  WBC 9.6 8.7 9.5  RBC 4.55 4.75 4.45  HGB 13.4 13.9 13.1  HCT 38.8* 40.6 38.7*  MCV 85.3 85.5 87.0  MCH 29.5 29.3 29.4  MCHC 34.5 34.2 33.9  RDW 14.6 14.8 14.7  PLT 387 409* 430*    Cardiac Enzymes  Lab 10/13/21 0714 10/13/21 0836 10/13/21 1150 10/13/21 1327 10/13/21 1854  TROPONINIHS 397* 1,101* 9,868* 9,340* 15,166*        BNPNo results for input(s): "BNP", "PROBNP" in the last 168 hours.   DDimer No results for input(s): "DDIMER" in the last 168 hours.   Lipid Panel     Component Value Date/Time   CHOL 135 10/14/2021 0623   TRIG 164 (H) 10/14/2021 0623   HDL 42 10/14/2021 0623   CHOLHDL 3.2 10/14/2021 0623    VLDL 33 10/14/2021 0623   LDLCALC 60 10/14/2021 0623   LDLDIRECT 30.0 11/10/2020 1018     Radiology    VAS US DOPPLER PRE CABG  Result Date: 10/20/2021 PREOPERATIVE VASCULAR EVALUATION Patient Name:  ISIDORO SANTILLANA  Date of Exam:   10/20/2021 Medical Rec #: [id number]           Accession #:    [id number] Date of Birth: 09-Oct-1945            Patient Gender: M Patient Age:   47 years Exam Location:  [facility] Procedure:      VAS US DOPPLER PRE CABG Referring Phys: [facility] --------------------------------------------------------------------------------  Indications:      Pre-CABG. Risk Factors:     Hypertension, hyperlipidemia, Diabetes. Comparison Study: no prior Performing Technologist: Archie Patten RVS  Examination Guidelines: A complete evaluation includes B-mode imaging, spectral Doppler, color Doppler, and power Doppler as needed of all accessible portions of each vessel. Bilateral testing is considered an integral part of a complete examination. Limited examinations for reoccurring indications may be performed as noted.  Right Carotid Findings: +----------+--------+--------+--------+------------+--------+           PSV cm/sEDV cm/sStenosisDescribe    Comments +----------+--------+--------+--------+------------+--------+ CCA Prox  49      14              heterogenous         +----------+--------+--------+--------+------------+--------+ CCA Distal50      18              heterogenous         +----------+--------+--------+--------+------------+--------+ ICA Prox  49      23      1-39%   heterogenous         +----------+--------+--------+--------+------------+--------+ ICA Distal42      21                                   +----------+--------+--------+--------+------------+--------+ ECA       49      5                                    +----------+--------+--------+--------+------------+--------+  +----------+--------+-------+--------+------------+           PSV cm/sEDV cmsDescribeArm Pressure +----------+--------+-------+--------+------------+ [id number]                                  +----------+--------+-------+--------+------------+ +---------+--------+--+--------+--+---------+ VertebralPSV cm/s41EDV  cm/s17Antegrade +---------+--------+--+--------+--+---------+ Left Carotid Findings: +----------+--------+--------+--------+------------+--------+           PSV cm/sEDV cm/sStenosisDescribe    Comments +----------+--------+--------+--------+------------+--------+ CCA Prox  64      18              heterogenous         +----------+--------+--------+--------+------------+--------+ CCA Distal61      18              heterogenous         +----------+--------+--------+--------+------------+--------+ ICA Prox  51      22      1-39%   heterogenous         +----------+--------+--------+--------+------------+--------+ ICA Distal49      24                                   +----------+--------+--------+--------+------------+--------+ ECA       30      5                                    +----------+--------+--------+--------+------------+--------+ +----------+--------+--------+--------+------------+ SubclavianPSV cm/sEDV cm/sDescribeArm Pressure +----------+--------+--------+--------+------------+           60                                   +----------+--------+--------+--------+------------+ +---------+--------+--+--------+-+---------+ VertebralPSV cm/s25EDV cm/s9Antegrade +---------+--------+--+--------+-+---------+  ABI Findings: +---------+------------------+-----+---------+--------+ Right    Rt Pressure (mmHg)IndexWaveform Comment  +---------+------------------+-----+---------+--------+ Brachial 89                     triphasic         +---------+------------------+-----+---------+--------+ PTA      94                 0.98 triphasic         +---------+------------------+-----+---------+--------+ DP       109               1.14 triphasic         +---------+------------------+-----+---------+--------+ Great Toe59                0.61 Abnormal          +---------+------------------+-----+---------+--------+ +---------+------------------+-----+---------+-------+ Left     Lt Pressure (mmHg)IndexWaveform Comment +---------+------------------+-----+---------+-------+ Brachial 96                     triphasic        +---------+------------------+-----+---------+-------+ PTA      102               1.06 triphasic        +---------+------------------+-----+---------+-------+ DP       106               1.10 triphasic        +---------+------------------+-----+---------+-------+ Great Toe31                0.32 Abnormal         +---------+------------------+-----+---------+-------+ +-------+---------------+----------------+ ABI/TBIToday's ABI/TBIPrevious ABI/TBI +-------+---------------+----------------+ Right  1.14           0.61             +-------+---------------+----------------+ Left   1.10           0.32             +-------+---------------+----------------+  Right Doppler Findings: +--------+--------+-----+---------+--------+ Site    PressureIndexDoppler  Comments +--------+--------+-----+---------+--------+ [id number]           triphasic         +--------+--------+-----+---------+--------+ Radial               triphasic         +--------+--------+-----+---------+--------+ Ulnar                triphasic         +--------+--------+-----+---------+--------+  Left Doppler Findings: +--------+--------+-----+---------+--------+ Site    PressureIndexDoppler  Comments +--------+--------+-----+---------+--------+ Brachial96           triphasic         +--------+--------+-----+---------+--------+ Radial               triphasic          +--------+--------+-----+---------+--------+ Ulnar                triphasic         +--------+--------+-----+---------+--------+   Summary: Right Carotid: Velocities in the right ICA are consistent with a 1-39% stenosis. Left Carotid: Velocities in the left ICA are consistent with a 1-39% stenosis. Vertebrals: Bilateral vertebral arteries demonstrate antegrade flow. Right ABI: Resting right ankle-brachial index is within normal range. Left ABI: Resting left ankle-brachial index is within normal range. Right Upper Extremity: Doppler waveforms decrease 50% with right radial compression. Doppler waveforms remain within normal limits with right ulnar compression. Left Upper Extremity: Doppler waveforms remain within normal limits with left radial compression. Doppler waveforms remain within normal limits with left ulnar compression.  Electronically signed by Jamelle Haring on 10/20/2021 at 4:25:17 PM.    Final     Cardiac Studies   Limited echocardiogram 11-04-2021 1. The aortic valve is calcified. There is severe calcifcation of the aortic valve. Aortic valve regurgitation is not visualized. Mild aortic valve stenosis. Aortic valve area, by VTI measures 1.92 cm. Aortic valve mean gradient measures 9.0 mmHg. Aortic valve Vmax measures 1.89 m/s. Conclusion(s)/Recommendation(s): Limited study to assess aortic valve stenosis. Stenosis is mild visually, by gradients, by dimensionless index, and by valve area.    Echocardiogram 10/13/2021  1. Left ventricular ejection fraction, by estimation, is 60 to 65%. The  left ventricle has normal function. The left ventricle demonstrates  regional wall motion abnormalities with basal inferior and basal  inferolateral severe hypokinesis. There is mild  concentric left ventricular hypertrophy. Left ventricular diastolic  parameters are consistent with Grade II diastolic dysfunction  (pseudonormalization).   2. Right ventricular systolic function is normal. The right  ventricular  size is normal. Tricuspid regurgitation signal is inadequate for assessing  PA pressure.   3. The mitral valve is normal in structure. Mild mitral valve  regurgitation. No evidence of mitral stenosis.   4. The aortic valve is tricuspid. There is severe calcification of the  aortic valve. Aortic valve regurgitation is not visualized. There appears  to be some degree of aortic stenosis but the aortic valve was not fully  interrogated by doppler. Would  consider repeat limited echo with attention to aortic valve doppler  interrogation.   5. The inferior vena cava is normal in size with greater than 50%  respiratory variability, suggesting right atrial pressure of 3 mmHg.    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   Additional medical problems include paroxysmal atrial fibrillation, HTN, DM2, OSA, mild aortic stenosis  Assessment & Plan    1. CAD: Severe calcified LAD  treated with low level PTCA and unsuccessful attempt at atherectomy of LAD and reperfusion of RPLV branch.  He has not had any recurrent anginal symptomatology.  He is on a Brilinta washout.  He remains asymptomatic without recurrent chest pain or shortness of breath.  Surgery is scheduled for tomorrow, of October 22, 2021.  We will repeat ECG today since last tracing was October 16, 2021.  We will also repeat laboratory today prior to planned surgery.He continues to be on IV heparin at 1400 units/h.  Blood pressure remains well precluding initiation of additional anti-ischemic treatment.  Plan for CABG with Dr. Lavonna Monarch tomorrow. 2. Mild  aortic stenosis; peak gradient 14.3 on echo. 3. Grade 2 diastolic dysfunction. 4.  PVCs.  Over the past several days he was having frequent to occasional isolated PVCs particularly noted more after walking.  Presently he has remained in bed and has not yet been up today.  ECG is stable with sinus rhythm without ectopy with heart rate in the 50s.  Systolic blood pressures in the 90s and he currently is sinus bradycardic with beta-blocker therapy had not been initiated.     5. PAF: Maintaining sinus rhythm presently with previously documented occasional to frequent PVCs as noted above.  No recurrent atrial fibrillation.  He is anticoagulated on heparin.  Plan for LA appendage's occlusion/question maze procedure at CABG surgery.   6. OSA on CPAP, using at night in hospital.  Sleeping well. 7. DM type 2; consider plan for future GLP-1 agonist/ SGLT2 inhibitors with cardiovascular benefit. 8. HLP:   currently on atorvastatin 80 mg; LDL 60, on low dose fenofibrate TG 160.     Signed, Troy Sine, MD, [facility] 10/21/2021, 8:24 AM

## 2021-10-21 NOTE — TOC Progression Note (Signed)
Transition of Care [facility]) - Progression Note    Patient Details  Name: Jon [name] MRN: [id number] Date of Birth: 1945-07-11  Transition of Care [facility]) CM/SW [place], RN Phone Number:[phone]  10/21/2021, 3:51 PM  Clinical Narrative:    TOC continues to follow. Patient schedules for CABG 9/22.    Expected Discharge Plan: Home/Self Care Barriers to Discharge: Continued Medical Work up  Expected Discharge Plan and Services Expected Discharge Plan: Home/Self Care In-house Referral: NA Discharge Planning Services: CM Consult Post Acute Care Choice: NA Living arrangements for the past 2 months: Single Family Home                 DME Arranged: N/A DME Agency: NA       HH Arranged: NA           Social Determinants of Health (SDOH) Interventions    Readmission Risk Interventions    10/18/2021    4:34 PM  Readmission Risk Prevention Plan  Transportation Screening Complete  PCP or Specialist Appt within 3-5 Days Complete  HRI or [place] Complete  Social Work Consult for [place] Planning/Counseling Complete  Palliative Care Screening Not Applicable  Medication Review Press photographer) Referral to Pharmacy

## 2021-10-21 NOTE — Progress Notes (Signed)
Pt self administers CPAP for night rest.

## 2021-10-21 NOTE — Progress Notes (Addendum)
PROGRESS NOTE  Jon [name] WCB:[id number] DOB: 11-Aug-1945   PCP: [name], MD  Patient is from: Home.    DOA: 10/13/2021 LOS: 8  Brief Narrative / Interim history: 75 year old Male with PMH of DM-2, OSA on CPAP, paroxysmal A-fib, GEJ adenocarcinoma s/p XRT in 2012 and diverticulosis presented to the hospital with new onset chest pain and near syncope associated with lightheadedness and blurry vision, and found to have inferior STEMI, bradycardia, hypotension and AKI.  Initial EKG showed atrial flutter with various transduction. Troponin trended from 11-15,000.  Subsequent EKG concerning for inferior ST elevation.  Cardiology was consulted. TTE with LVEF of 60 to 65%, regional wal motion abnormality and G2-DD. CT chest with significant chronic calcification.  LHC with occluded posterolateral branch felt to be the culprit lesion but flow could not be restored with balloon angioplasty.  LHC also showed severe serial mid LAD lesions that that could not be addressed by staged PCI.  Cardiothoracic surgery was consulted.   At this time, patient is awaiting for plan for CABG and ligation of LA appendage  10/22/21 after Brilinta washout.  Last dose of Brilinta the morning of 9/15.  Subjective: Today, patient was seen and examined at bedside.  Denies any chest pain, shortness of breath, fever, chills or rigor.  No interval complaints overnight.   Objective: Vitals:   10/21/21 0006 10/21/21 0316 10/21/21 0633 10/21/21 0801  BP: (!) 120/94 94/69  90/68  Pulse: 78 (!) 51    Resp: '19 16  16  '$ Temp: 98.6 F (37 C) 98.1 F (36.7 C)  98.5 F (36.9 C)  TempSrc: Oral Oral    SpO2: 98% 99%    Weight:   85.5 kg   Height:        Examination: Body mass index is 27.05 kg/m.   General:  Average built, not in obvious distress HENT:   No scleral pallor or icterus noted. Oral mucosa is moist.  Chest:  Clear breath sounds.  Diminished breath sounds bilaterally. No crackles or wheezes.   CVS: S1 &S2 heard. No murmur.  Regular rate and rhythm. Abdomen: Soft, nontender, nondistended.  Bowel sounds are heard.   Extremities: No cyanosis, clubbing or edema.  Peripheral pulses are palpable. Psych: Alert, awake and oriented, normal mood CNS:  No cranial nerve deficits.  Power equal in all extremities.   Skin: Warm and dry.  Ecchymosis over the right neck and right lateral chest.   Procedures:  9/13-LHC with occluded posterolateral branch felt to be the culprit lesion but flow could not be restored with balloon angioplasty.  LHC also showed severe serial mid LAD lesions.    Assessment and plan: Principal Problem:   Inferior ST segment elevation Active Problems:   OSA on CPAP   CHEST PAIN   PAF (paroxysmal atrial fibrillation) (HCC)   Primary adenocarcinoma of GE junction   Diabetes mellitus, type 2 (HCC)   Coronary artery disease   New onset a-fib (HCC)   Near syncope   Syncope   Non-ST elevation (NSTEMI) myocardial infarction (HCC)   Frequent PVCs  Inferior STEMI/CAD:  Significantly elevated troponin with EKG suspicion for inferior ST elevation MI.  2D echocardiogram with LVEF of 60 to 65%, RWMA and G2 DD.  CT chest with significant coronary calcification.  LHC with occluded posterolateral branch and severe serial mid LAD lesions.  PCI was unsuccessful.  Thoracic surgery was consulted.  Plan for CABG after Brilinta washout on Friday after Brilinta washout.  Last dose 9/15. -  Continue high intensity statin, low-dose aspirin and IV heparin.  Sinus symptomatic bradycardia/syncope:  No further dizziness still bradycardic.  Paroxysmal A-fib/flutter: CHA2DS2-VASc score > 3.  Currently in sinus rhythm. Cardiothoracic surgery is planning for ligation of LA appendage during CABG  Hypotension: Still borderline low.  Asymptomatic.  AKI: Likely due to hypotension.  Improved.  Latest creatinine of 1.1.  Uncontrolled NIDDM-2: Patient hemoglobin A1c 7.1% on 09/02/2021.Continue  sliding scale insulin statins.    OSA on CPAP -CPAP at bedtime  DVT prophylaxis:   IV heparin  Code Status: Full code  Family Communication: None at bedside.  I tried to reach the patient's son on the phone today but was unable to reach him  Level of care: Telemetry Cardiac  Status is: Inpatient  Remains inpatient appropriate because: STEMI waiting on CABG  Final disposition: Likely home after CABG  Consultants:  Cardiology CVTS  Sch Meds:  Scheduled Meds:  aspirin  81 mg Oral Daily   atorvastatin  80 mg Oral Daily   atropine  0.5 mg Intravenous Once   fenofibrate  54 mg Oral Daily   insulin aspart  0-15 Units Subcutaneous TID WC   pantoprazole  40 mg Oral Daily   sodium chloride flush  3 mL Intravenous Q12H   sodium chloride flush  3 mL Intravenous Q12H   Continuous Infusions:  sodium chloride     sodium chloride     heparin 1,400 Units/hr (10/20/21 2014)   PRN Meds:.sodium chloride, sodium chloride, acetaminophen, HYDROcodone-acetaminophen, loratadine, ondansetron (ZOFRAN) IV, mouth rinse, sodium chloride flush, sodium chloride flush  Antimicrobials: Anti-infectives (From admission, onward)    None      I have personally reviewed the following labs and images: CBC: Recent Labs  Lab 10/17/21 0755 10/18/21 0822 10/19/21 0630 10/20/21 0639 10/21/21 0410  WBC 8.4 8.2 9.6 8.7 9.5  HGB 13.4 14.3 13.4 13.9 13.1  HCT 39.0 41.7 38.8* 40.6 38.7*  MCV 85.7 85.6 85.3 85.5 87.0  PLT 339 400 387 409* 430*    BMP &GFR Recent Labs  Lab 10/15/21 0639 10/16/21 0557 10/17/21 0755  NA 142 137 136  K 4.4 4.0 3.8  CL 108 105 105  CO2 26 21* 23  GLUCOSE 103* 108* 181*  BUN '11 12 12  '$ CREATININE 1.28* 1.35* 1.41*  CALCIUM 9.3 9.1 9.2  MG 1.9 1.9 1.9  PHOS 2.4* 2.9 3.3    Estimated Creatinine Clearance: 46 mL/min (A) (by C-G formula based on SCr of 1.41 mg/dL (H)). Liver & Pancreas: Recent Labs  Lab 10/15/21 0639 10/16/21 0557 10/17/21 0755  ALBUMIN  3.7 3.4* 3.6    No results for input(s): "LIPASE", "AMYLASE" in the last 168 hours.  No results for input(s): "AMMONIA" in the last 168 hours. Diabetic: No results for input(s): "HGBA1C" in the last 72 hours. Recent Labs  Lab 10/20/21 0614 10/20/21 1137 10/20/21 1645 10/20/21 2115 10/21/21 0614  GLUCAP 96 95 80 110* 102*    Cardiac Enzymes: No results for input(s): "CKTOTAL", "CKMB", "CKMBINDEX", "TROPONINI" in the last 168 hours. No results for input(s): "PROBNP" in the last 8760 hours. Coagulation Profile: No results for input(s): "INR", "PROTIME" in the last 168 hours. Thyroid Function Tests: No results for input(s): "TSH", "T4TOTAL", "FREET4", "T3FREE", "THYROIDAB" in the last 72 hours.  Lipid Profile: No results for input(s): "CHOL", "HDL", "LDLCALC", "TRIG", "CHOLHDL", "LDLDIRECT" in the last 72 hours.  Anemia Panel: No results for input(s): "VITAMINB12", "FOLATE", "FERRITIN", "TIBC", "IRON", "RETICCTPCT" in the last 72 hours.  Urine  analysis:    Component Value Date/Time   COLORURINE YELLOW 10/13/2021 0630   APPEARANCEUR CLEAR 10/13/2021 0630   LABSPEC 1.010 10/13/2021 0630   PHURINE 7.0 10/13/2021 0630   GLUCOSEU NEGATIVE 10/13/2021 0630   GLUCOSEU NEGATIVE 06/10/2019 0955   HGBUR NEGATIVE 10/13/2021 0630   BILIRUBINUR NEGATIVE 10/13/2021 0630   KETONESUR NEGATIVE 10/13/2021 0630   PROTEINUR NEGATIVE 10/13/2021 0630   UROBILINOGEN 1.0 06/10/2019 0955   NITRITE NEGATIVE 10/13/2021 0630   LEUKOCYTESUR NEGATIVE 10/13/2021 0630     Microbiology: Recent Results (from the past 240 hour(s))  MRSA Next Gen by PCR, Nasal     Status: None   Collection Time: 10/13/21  5:03 PM   Specimen: Nasal Mucosa; Nasal Swab  Result Value Ref Range Status   MRSA by PCR Next Gen NOT DETECTED NOT DETECTED Final    Comment: (NOTE) The GeneXpert MRSA Assay (FDA approved for NASAL specimens only), is one component of a comprehensive MRSA colonization surveillance program. It  is not intended to diagnose MRSA infection nor to guide or monitor treatment for MRSA infections. Test performance is not FDA approved in patients less than 57 years old. Performed at [facility], [place] [address], [place], [place] [address]     Radiology Studies: VAS US DOPPLER PRE CABG  Result Date: 10/20/2021 PREOPERATIVE VASCULAR EVALUATION Patient Name:  Jon [name]  Date of Exam:   10/20/2021 Medical Rec #: [id number]           Accession #:    [id number] Date of Birth: [birth year]            Patient Gender: M Patient Age:   89 years Exam Location:  [facility] Procedure:      VAS US DOPPLER PRE CABG Referring Phys: [facility]U --------------------------------------------------------------------------------  Indications:      Pre-CABG. Risk Factors:     Hypertension, hyperlipidemia, Diabetes. Comparison Study: no prior Performing Technologist: Archie Patten RVS  Examination Guidelines: A complete evaluation includes B-mode imaging, spectral Doppler, color Doppler, and power Doppler as needed of all accessible portions of each vessel. Bilateral testing is considered an integral part of a complete examination. Limited examinations for reoccurring indications may be performed as noted.  Right Carotid Findings: +----------+--------+--------+--------+------------+--------+           PSV cm/sEDV cm/sStenosisDescribe    Comments +----------+--------+--------+--------+------------+--------+ CCA Prox  49      14              heterogenous         +----------+--------+--------+--------+------------+--------+ CCA Distal50      18              heterogenous         +----------+--------+--------+--------+------------+--------+ ICA Prox  49      23      1-39%   heterogenous         +----------+--------+--------+--------+------------+--------+ ICA Distal42      21                                   +----------+--------+--------+--------+------------+--------+ ECA        49      5                                    +----------+--------+--------+--------+------------+--------+ +----------+--------+-------+--------+------------+  PSV cm/sEDV cmsDescribeArm Pressure +----------+--------+-------+--------+------------+ [id number]                                  +----------+--------+-------+--------+------------+ +---------+--------+--+--------+--+---------+ VertebralPSV cm/s41EDV cm/s17Antegrade +---------+--------+--+--------+--+---------+ Left Carotid Findings: +----------+--------+--------+--------+------------+--------+           PSV cm/sEDV cm/sStenosisDescribe    Comments +----------+--------+--------+--------+------------+--------+ CCA Prox  64      18              heterogenous         +----------+--------+--------+--------+------------+--------+ CCA Distal61      18              heterogenous         +----------+--------+--------+--------+------------+--------+ ICA Prox  51      22      1-39%   heterogenous         +----------+--------+--------+--------+------------+--------+ ICA Distal49      24                                   +----------+--------+--------+--------+------------+--------+ ECA       30      5                                    +----------+--------+--------+--------+------------+--------+ +----------+--------+--------+--------+------------+ SubclavianPSV cm/sEDV cm/sDescribeArm Pressure +----------+--------+--------+--------+------------+           60                                   +----------+--------+--------+--------+------------+ +---------+--------+--+--------+-+---------+ VertebralPSV cm/s25EDV cm/s9Antegrade +---------+--------+--+--------+-+---------+  ABI Findings: +---------+------------------+-----+---------+--------+ Right    Rt Pressure (mmHg)IndexWaveform Comment  +---------+------------------+-----+---------+--------+ Brachial 89                      triphasic         +---------+------------------+-----+---------+--------+ PTA      94                0.98 triphasic         +---------+------------------+-----+---------+--------+ DP       109               1.14 triphasic         +---------+------------------+-----+---------+--------+ Great Toe59                0.61 Abnormal          +---------+------------------+-----+---------+--------+ +---------+------------------+-----+---------+-------+ Left     Lt Pressure (mmHg)IndexWaveform Comment +---------+------------------+-----+---------+-------+ Brachial 96                     triphasic        +---------+------------------+-----+---------+-------+ PTA      102               1.06 triphasic        +---------+------------------+-----+---------+-------+ DP       106               1.10 triphasic        +---------+------------------+-----+---------+-------+ Great Toe31                0.32 Abnormal         +---------+------------------+-----+---------+-------+ +-------+---------------+----------------+ ABI/TBIToday's ABI/TBIPrevious ABI/TBI +-------+---------------+----------------+ Right  1.14  0.61             +-------+---------------+----------------+ Left   1.10           0.32             +-------+---------------+----------------+  Right Doppler Findings: +--------+--------+-----+---------+--------+ Site    PressureIndexDoppler  Comments +--------+--------+-----+---------+--------+ [id number]           triphasic         +--------+--------+-----+---------+--------+ Radial               triphasic         +--------+--------+-----+---------+--------+ Ulnar                triphasic         +--------+--------+-----+---------+--------+  Left Doppler Findings: +--------+--------+-----+---------+--------+ Site    PressureIndexDoppler  Comments +--------+--------+-----+---------+--------+ Brachial96            triphasic         +--------+--------+-----+---------+--------+ Radial               triphasic         +--------+--------+-----+---------+--------+ Ulnar                triphasic         +--------+--------+-----+---------+--------+   Summary: Right Carotid: Velocities in the right ICA are consistent with a 1-39% stenosis. Left Carotid: Velocities in the left ICA are consistent with a 1-39% stenosis. Vertebrals: Bilateral vertebral arteries demonstrate antegrade flow. Right ABI: Resting right ankle-brachial index is within normal range. Left ABI: Resting left ankle-brachial index is within normal range. Right Upper Extremity: Doppler waveforms decrease 50% with right radial compression. Doppler waveforms remain within normal limits with right ulnar compression. Left Upper Extremity: Doppler waveforms remain within normal limits with left radial compression. Doppler waveforms remain within normal limits with left ulnar compression.  Electronically signed by Jamelle Haring on 10/20/2021 at 4:25:17 PM.    Final      Oscar La, MD [facility] If 7PM-7AM, please contact night-coverage [email or web address] 10/21/2021, 10:56 AM

## 2021-10-21 NOTE — Progress Notes (Signed)
ANTICOAGULATION CONSULT NOTE   Pharmacy Consult for heparin Indication: atrial fibrillation  No Known Allergies  Patient Measurements: Height: '5\' 10"'$  (177.8 cm) Weight: 85.5 kg (188 lb 7.9 oz) IBW/kg (Calculated) : 73 Heparin Dosing Weight: 86.2 kg  Vital Signs: Temp: 98.5 F (36.9 C) (09/21 0801) Temp Source: Oral (09/21 0316) BP: 90/68 (09/21 0801) Pulse Rate: 51 (09/21 0316)  Labs: Recent Labs    10/19/21 0630 10/20/21 0639 10/21/21 0410  HGB 13.4 13.9 13.1  HCT 38.8* 40.6 38.7*  PLT 387 409* 430*  HEPARINUNFRC 0.36 0.41 0.24*     Estimated Creatinine Clearance: 46 mL/min (A) (by C-G formula based on SCr of 1.41 mg/dL (H)).   Medical History: Past Medical History:  Diagnosis Date   Acute intractable headache 09/02/2021   Adenomatous colon polyp    Anemia    Atrial fibrillation (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 pylori infection 2011   Heart murmur    History of radiation therapy 09/20/10 thru 10/29/10   gastroesophageal junction/gastric cardia   Hypertension    Neuromuscular disorder ([place])    Other and unspecified hyperlipidemia    Sleep apnea    On CPAP   Status post chemotherapy 09/22/10 thru 10/27/10   concurrent w/radiation   Unspecified sleep apnea     Medications:  Infusions:   sodium chloride     sodium chloride     heparin 1,400 Units/hr (10/20/21 2014)    Assessment: 76 yo male with CAD planning CABG after ticagrelor washout (LD 9/15 am). Pharmacy asked to restart heparin s/p cath for recurrent  afib (now NSR).  For CABG on 9/22  Heparin drip at 1400 units/hr  with heparin level below goal  Goal of Therapy:  Heparin level 0.3-0.7 units/ml Monitor platelets by anticoagulation protocol: Yes   Plan:  -Increase heparin to 1500 units/hr -Daily  heparin level and CBC  Hildred Laser, PharmD Clinical Pharmacist **Pharmacist phone directory can now be found on [email or web address] ([email or web address]).  Listed under [place].

## 2021-10-21 NOTE — Progress Notes (Signed)
CARDIAC REHAB PHASE I      Pt has just walked with mobility. He is feeling good today and looking forward to having surgery tomorrow. He is eager to move on to recovery phase and get home. All questions and concerns addressed regarding pre op teaching. Will continue to monitor.   [id number]    Vanessa Barbara, RN BSN 10/21/2021 12:05 PM

## 2021-10-21 NOTE — Anesthesia Preprocedure Evaluation (Addendum)
Anesthesia Evaluation  Patient identified by MRN, date of birth, ID band Patient awake    Reviewed: Allergy & Precautions, NPO status , Patient's Chart, lab work & pertinent test results  Airway Mallampati: II  TM Distance: >3 FB Neck ROM: Full    Dental  (+) Dental Advisory Given, Missing   Pulmonary sleep apnea and Continuous Positive Airway Pressure Ventilation , former smoker,    Pulmonary exam normal breath sounds clear to auscultation       Cardiovascular hypertension, + CAD and + Past MI  + dysrhythmias Atrial Fibrillation  Rhythm:Irregular Rate:Abnormal + Systolic murmurs Echo 2/37/62: 1. Left ventricular ejection fraction, by estimation, is 60 to 65%. The left ventricle has normal function. The left ventricle demonstrates regional wall motion abnormalities with basal inferior and basal inferolateral severe hypokinesis. There is mild concentric left ventricular hypertrophy. Left ventricular diastolic parameters are consistent with Grade II diastolic dysfunction (pseudonormalization).  2. Right ventricular systolic function is normal. The right ventricular size is normal. Tricuspid regurgitation signal is inadequate for assessing PA pressure.  3. The mitral valve is normal in structure. Mild mitral valve  regurgitation. No evidence of mitral stenosis.  4. The aortic valve is tricuspid. There is severe calcification of the aortic valve. Aortic valve regurgitation is not visualized. There appears to be some degree of aortic stenosis but the aortic valve was not fully  interrogated by doppler. Would consider repeat limited echo with attention to aortic valve doppler interrogation.  5. The inferior vena cava is normal in size with greater than 50% respiratory variability, suggesting right atrial pressure of 3 mmHg.    Neuro/Psych  Headaches,    GI/Hepatic Neg liver ROS, GERD  Medicated,Gastroesophageal cancer: History of  chemo/radiation therapy 09/20/10 thru 10/29/10 gastroesophageal junction/gastric cardia    Endo/Other  diabetes, Type 2, Oral Hypoglycemic Agents  Renal/GU Renal InsufficiencyRenal disease     Musculoskeletal negative musculoskeletal ROS (+)   Abdominal   Peds  Hematology negative hematology ROS (+)   Anesthesia Other Findings   Reproductive/Obstetrics                            Anesthesia Physical Anesthesia Plan  ASA: 4  Anesthesia Plan: General   Post-op Pain Management: Minimal or no pain anticipated   Induction: Intravenous  PONV Risk Score and Plan: 2 and Midazolam and Treatment may vary due to age or medical condition  Airway Management Planned: Oral ETT  Additional Equipment: Arterial line, CVP, PA Cath, TEE and Ultrasound Guidance Line Placement  Intra-op Plan:   Post-operative Plan: Post-operative intubation/ventilation  Informed Consent: I have reviewed the patients History and Physical, chart, labs and discussed the procedure including the risks, benefits and alternatives for the proposed anesthesia with the patient or authorized representative who has indicated his/her understanding and acceptance.     Dental advisory given  Plan Discussed with: CRNA  Anesthesia Plan Comments:        Anesthesia Quick Evaluation

## 2021-10-22 ENCOUNTER — Inpatient Hospital Stay (HOSPITAL_COMMUNITY): Payer: Medicare Other

## 2021-10-22 ENCOUNTER — Other Ambulatory Visit: Payer: Self-pay

## 2021-10-22 ENCOUNTER — Inpatient Hospital Stay (HOSPITAL_COMMUNITY): Payer: Medicare Other | Admitting: Anesthesiology

## 2021-10-22 ENCOUNTER — Inpatient Hospital Stay (HOSPITAL_COMMUNITY)
Admission: EM | Disposition: A | Discharge status: Home / Self Care | Payer: Self-pay | Source: Home / Self Care | Attending: Thoracic Surgery (Cardiothoracic Vascular Surgery)

## 2021-10-22 DIAGNOSIS — I213 ST elevation (STEMI) myocardial infarction of unspecified site: Secondary | ICD-10-CM

## 2021-10-22 DIAGNOSIS — Z87891 Personal history of nicotine dependence: Secondary | ICD-10-CM

## 2021-10-22 DIAGNOSIS — I4891 Unspecified atrial fibrillation: Secondary | ICD-10-CM | POA: Diagnosis not present

## 2021-10-22 DIAGNOSIS — Z8679 Personal history of other diseases of the circulatory system: Secondary | ICD-10-CM

## 2021-10-22 DIAGNOSIS — I2510 Atherosclerotic heart disease of native coronary artery without angina pectoris: Secondary | ICD-10-CM

## 2021-10-22 DIAGNOSIS — I340 Nonrheumatic mitral (valve) insufficiency: Secondary | ICD-10-CM | POA: Diagnosis not present

## 2021-10-22 DIAGNOSIS — E1159 Type 2 diabetes mellitus with other circulatory complications: Secondary | ICD-10-CM | POA: Diagnosis not present

## 2021-10-22 DIAGNOSIS — I480 Paroxysmal atrial fibrillation: Secondary | ICD-10-CM | POA: Diagnosis not present

## 2021-10-22 DIAGNOSIS — I10 Essential (primary) hypertension: Secondary | ICD-10-CM

## 2021-10-22 DIAGNOSIS — R9431 Abnormal electrocardiogram [ECG] [EKG]: Secondary | ICD-10-CM | POA: Diagnosis not present

## 2021-10-22 DIAGNOSIS — D62 Acute posthemorrhagic anemia: Secondary | ICD-10-CM

## 2021-10-22 DIAGNOSIS — Z9889 Other specified postprocedural states: Secondary | ICD-10-CM

## 2021-10-22 DIAGNOSIS — Z951 Presence of aortocoronary bypass graft: Secondary | ICD-10-CM

## 2021-10-22 DIAGNOSIS — I200 Unstable angina: Secondary | ICD-10-CM | POA: Diagnosis not present

## 2021-10-22 HISTORY — PX: CORONARY ARTERY BYPASS GRAFT: SHX141

## 2021-10-22 HISTORY — PX: MAZE: SHX5063

## 2021-10-22 HISTORY — PX: MITRAL VALVE REPAIR: SHX2039

## 2021-10-22 HISTORY — PX: TEE WITHOUT CARDIOVERSION: SHX5443

## 2021-10-22 LAB — CBC
HCT: 44.3 % (ref 39.0–52.0)
HCT: 35.1 % — ABNORMAL LOW (ref 39.0–52.0)
HCT: 32.8 % — ABNORMAL LOW (ref 39.0–52.0)
Hemoglobin: 15.3 g/dL (ref 13.0–17.0)
Hemoglobin: 12 g/dL — ABNORMAL LOW (ref 13.0–17.0)
Hemoglobin: 10.7 g/dL — ABNORMAL LOW (ref 13.0–17.0)
MCH: 29.8 pg (ref 26.0–34.0)
MCH: 29.6 pg (ref 26.0–34.0)
MCH: 28.8 pg (ref 26.0–34.0)
MCHC: 34.5 g/dL (ref 30.0–36.0)
MCHC: 34.2 g/dL (ref 30.0–36.0)
MCHC: 32.6 g/dL (ref 30.0–36.0)
MCV: 86.2 fL (ref 80.0–100.0)
MCV: 86.5 fL (ref 80.0–100.0)
MCV: 88.2 fL (ref 80.0–100.0)
Platelets: 526 10*3/uL — ABNORMAL HIGH (ref 150–400)
Platelets: 249 10*3/uL (ref 150–400)
Platelets: 302 10*3/uL (ref 150–400)
RBC: 5.14 MIL/uL (ref 4.22–5.81)
RBC: 4.06 MIL/uL — ABNORMAL LOW (ref 4.22–5.81)
RBC: 3.72 MIL/uL — ABNORMAL LOW (ref 4.22–5.81)
RDW: 15 % (ref 11.5–15.5)
RDW: 14.8 % (ref 11.5–15.5)
RDW: 15 % (ref 11.5–15.5)
WBC: 10 10*3/uL (ref 4.0–10.5)
WBC: 14.5 10*3/uL — ABNORMAL HIGH (ref 4.0–10.5)
WBC: 10.6 10*3/uL — ABNORMAL HIGH (ref 4.0–10.5)
nRBC: 0 % (ref 0.0–0.2)
nRBC: 0 % (ref 0.0–0.2)
nRBC: 0 % (ref 0.0–0.2)

## 2021-10-22 LAB — POCT I-STAT 7, (LYTES, BLD GAS, ICA,H+H)
Acid-Base Excess: 2 mmol/L (ref 0.0–2.0)
Acid-Base Excess: 2 mmol/L (ref 0.0–2.0)
Acid-Base Excess: 3 mmol/L — ABNORMAL HIGH (ref 0.0–2.0)
Acid-base deficit: 4 mmol/L — ABNORMAL HIGH (ref 0.0–2.0)
Acid-base deficit: 7 mmol/L — ABNORMAL HIGH (ref 0.0–2.0)
Acid-base deficit: 6 mmol/L — ABNORMAL HIGH (ref 0.0–2.0)
Acid-base deficit: 7 mmol/L — ABNORMAL HIGH (ref 0.0–2.0)
Bicarbonate: 28 mmol/L (ref 20.0–28.0)
Bicarbonate: 26.1 mmol/L (ref 20.0–28.0)
Bicarbonate: 26.2 mmol/L (ref 20.0–28.0)
Bicarbonate: 21.4 mmol/L (ref 20.0–28.0)
Bicarbonate: 18.4 mmol/L — ABNORMAL LOW (ref 20.0–28.0)
Bicarbonate: 19.4 mmol/L — ABNORMAL LOW (ref 20.0–28.0)
Bicarbonate: 18.4 mmol/L — ABNORMAL LOW (ref 20.0–28.0)
Calcium, Ion: 1.06 mmol/L — ABNORMAL LOW (ref 1.15–1.40)
Calcium, Ion: 0.94 mmol/L — ABNORMAL LOW (ref 1.15–1.40)
Calcium, Ion: 0.93 mmol/L — ABNORMAL LOW (ref 1.15–1.40)
Calcium, Ion: 1.15 mmol/L (ref 1.15–1.40)
Calcium, Ion: 1.13 mmol/L — ABNORMAL LOW (ref 1.15–1.40)
Calcium, Ion: 1.13 mmol/L — ABNORMAL LOW (ref 1.15–1.40)
Calcium, Ion: 1.14 mmol/L — ABNORMAL LOW (ref 1.15–1.40)
HCT: 32 % — ABNORMAL LOW (ref 39.0–52.0)
HCT: 30 % — ABNORMAL LOW (ref 39.0–52.0)
HCT: 28 % — ABNORMAL LOW (ref 39.0–52.0)
HCT: 35 % — ABNORMAL LOW (ref 39.0–52.0)
HCT: 31 % — ABNORMAL LOW (ref 39.0–52.0)
HCT: 30 % — ABNORMAL LOW (ref 39.0–52.0)
HCT: 31 % — ABNORMAL LOW (ref 39.0–52.0)
Hemoglobin: 10.9 g/dL — ABNORMAL LOW (ref 13.0–17.0)
Hemoglobin: 10.2 g/dL — ABNORMAL LOW (ref 13.0–17.0)
Hemoglobin: 9.5 g/dL — ABNORMAL LOW (ref 13.0–17.0)
Hemoglobin: 11.9 g/dL — ABNORMAL LOW (ref 13.0–17.0)
Hemoglobin: 10.5 g/dL — ABNORMAL LOW (ref 13.0–17.0)
Hemoglobin: 10.2 g/dL — ABNORMAL LOW (ref 13.0–17.0)
Hemoglobin: 10.5 g/dL — ABNORMAL LOW (ref 13.0–17.0)
O2 Saturation: 100 %
O2 Saturation: 100 %
O2 Saturation: 100 %
O2 Saturation: 99 %
O2 Saturation: 98 %
O2 Saturation: 98 %
O2 Saturation: 97 %
Patient temperature: 35.8
Patient temperature: 36.9
Patient temperature: 37.4
Patient temperature: 37.9
Potassium: 3.5 mmol/L (ref 3.5–5.1)
Potassium: 4 mmol/L (ref 3.5–5.1)
Potassium: 4.5 mmol/L (ref 3.5–5.1)
Potassium: 3.4 mmol/L — ABNORMAL LOW (ref 3.5–5.1)
Potassium: 4.2 mmol/L (ref 3.5–5.1)
Potassium: 5 mmol/L (ref 3.5–5.1)
Potassium: 4.6 mmol/L (ref 3.5–5.1)
Sodium: 136 mmol/L (ref 135–145)
Sodium: 137 mmol/L (ref 135–145)
Sodium: 135 mmol/L (ref 135–145)
Sodium: 140 mmol/L (ref 135–145)
Sodium: 141 mmol/L (ref 135–145)
Sodium: 140 mmol/L (ref 135–145)
Sodium: 141 mmol/L (ref 135–145)
TCO2: 30 mmol/L (ref 22–32)
TCO2: 27 mmol/L (ref 22–32)
TCO2: 27 mmol/L (ref 22–32)
TCO2: 23 mmol/L (ref 22–32)
TCO2: 19 mmol/L — ABNORMAL LOW (ref 22–32)
TCO2: 21 mmol/L — ABNORMAL LOW (ref 22–32)
TCO2: 20 mmol/L — ABNORMAL LOW (ref 22–32)
pCO2 arterial: 52.6 mmHg — ABNORMAL HIGH (ref 32–48)
pCO2 arterial: 37.7 mmHg (ref 32–48)
pCO2 arterial: 33.9 mmHg (ref 32–48)
pCO2 arterial: 37.8 mmHg (ref 32–48)
pCO2 arterial: 36 mmHg (ref 32–48)
pCO2 arterial: 38.7 mmHg (ref 32–48)
pCO2 arterial: 38.4 mmHg (ref 32–48)
pH, Arterial: 7.335 — ABNORMAL LOW (ref 7.35–7.45)
pH, Arterial: 7.449 (ref 7.35–7.45)
pH, Arterial: 7.497 — ABNORMAL HIGH (ref 7.35–7.45)
pH, Arterial: 7.356 (ref 7.35–7.45)
pH, Arterial: 7.316 — ABNORMAL LOW (ref 7.35–7.45)
pH, Arterial: 7.31 — ABNORMAL LOW (ref 7.35–7.45)
pH, Arterial: 7.293 — ABNORMAL LOW (ref 7.35–7.45)
pO2, Arterial: 401 mmHg — ABNORMAL HIGH (ref 83–108)
pO2, Arterial: 326 mmHg — ABNORMAL HIGH (ref 83–108)
pO2, Arterial: 360 mmHg — ABNORMAL HIGH (ref 83–108)
pO2, Arterial: 123 mmHg — ABNORMAL HIGH (ref 83–108)
pO2, Arterial: 111 mmHg — ABNORMAL HIGH (ref 83–108)
pO2, Arterial: 119 mmHg — ABNORMAL HIGH (ref 83–108)
pO2, Arterial: 102 mmHg (ref 83–108)

## 2021-10-22 LAB — HEPARIN LEVEL (UNFRACTIONATED): Heparin Unfractionated: 0.77 IU/mL — ABNORMAL HIGH (ref 0.30–0.70)

## 2021-10-22 LAB — GLUCOSE, CAPILLARY
Glucose-Capillary: 103 mg/dL — ABNORMAL HIGH (ref 70–99)
Glucose-Capillary: 63 mg/dL — ABNORMAL LOW (ref 70–99)
Glucose-Capillary: 113 mg/dL — ABNORMAL HIGH (ref 70–99)
Glucose-Capillary: 121 mg/dL — ABNORMAL HIGH (ref 70–99)
Glucose-Capillary: 109 mg/dL — ABNORMAL HIGH (ref 70–99)
Glucose-Capillary: 98 mg/dL (ref 70–99)
Glucose-Capillary: 117 mg/dL — ABNORMAL HIGH (ref 70–99)
Glucose-Capillary: 97 mg/dL (ref 70–99)
Glucose-Capillary: 127 mg/dL — ABNORMAL HIGH (ref 70–99)
Glucose-Capillary: 135 mg/dL — ABNORMAL HIGH (ref 70–99)
Glucose-Capillary: 150 mg/dL — ABNORMAL HIGH (ref 70–99)
Glucose-Capillary: 125 mg/dL — ABNORMAL HIGH (ref 70–99)

## 2021-10-22 LAB — POCT I-STAT, CHEM 8
BUN: 15 mg/dL (ref 8–23)
BUN: 15 mg/dL (ref 8–23)
BUN: 13 mg/dL (ref 8–23)
BUN: 13 mg/dL (ref 8–23)
BUN: 13 mg/dL (ref 8–23)
Calcium, Ion: 1.24 mmol/L (ref 1.15–1.40)
Calcium, Ion: 1.24 mmol/L (ref 1.15–1.40)
Calcium, Ion: 1 mmol/L — ABNORMAL LOW (ref 1.15–1.40)
Calcium, Ion: 0.96 mmol/L — ABNORMAL LOW (ref 1.15–1.40)
Calcium, Ion: 1.11 mmol/L — ABNORMAL LOW (ref 1.15–1.40)
Chloride: 102 mmol/L (ref 98–111)
Chloride: 102 mmol/L (ref 98–111)
Chloride: 102 mmol/L (ref 98–111)
Chloride: 101 mmol/L (ref 98–111)
Chloride: 102 mmol/L (ref 98–111)
Creatinine, Ser: 1.1 mg/dL (ref 0.61–1.24)
Creatinine, Ser: 1 mg/dL (ref 0.61–1.24)
Creatinine, Ser: 0.9 mg/dL (ref 0.61–1.24)
Creatinine, Ser: 0.8 mg/dL (ref 0.61–1.24)
Creatinine, Ser: 0.9 mg/dL (ref 0.61–1.24)
Glucose, Bld: 136 mg/dL — ABNORMAL HIGH (ref 70–99)
Glucose, Bld: 129 mg/dL — ABNORMAL HIGH (ref 70–99)
Glucose, Bld: 145 mg/dL — ABNORMAL HIGH (ref 70–99)
Glucose, Bld: 135 mg/dL — ABNORMAL HIGH (ref 70–99)
Glucose, Bld: 125 mg/dL — ABNORMAL HIGH (ref 70–99)
HCT: 39 % (ref 39.0–52.0)
HCT: 42 % (ref 39.0–52.0)
HCT: 33 % — ABNORMAL LOW (ref 39.0–52.0)
HCT: 32 % — ABNORMAL LOW (ref 39.0–52.0)
HCT: 27 % — ABNORMAL LOW (ref 39.0–52.0)
Hemoglobin: 13.3 g/dL (ref 13.0–17.0)
Hemoglobin: 14.3 g/dL (ref 13.0–17.0)
Hemoglobin: 11.2 g/dL — ABNORMAL LOW (ref 13.0–17.0)
Hemoglobin: 10.9 g/dL — ABNORMAL LOW (ref 13.0–17.0)
Hemoglobin: 9.2 g/dL — ABNORMAL LOW (ref 13.0–17.0)
Potassium: 4 mmol/L (ref 3.5–5.1)
Potassium: 3.9 mmol/L (ref 3.5–5.1)
Potassium: 4.5 mmol/L (ref 3.5–5.1)
Potassium: 3.6 mmol/L (ref 3.5–5.1)
Potassium: 3.9 mmol/L (ref 3.5–5.1)
Sodium: 138 mmol/L (ref 135–145)
Sodium: 138 mmol/L (ref 135–145)
Sodium: 135 mmol/L (ref 135–145)
Sodium: 138 mmol/L (ref 135–145)
Sodium: 138 mmol/L (ref 135–145)
TCO2: 29 mmol/L (ref 22–32)
TCO2: 25 mmol/L (ref 22–32)
TCO2: 26 mmol/L (ref 22–32)
TCO2: 26 mmol/L (ref 22–32)
TCO2: 25 mmol/L (ref 22–32)

## 2021-10-22 LAB — POCT I-STAT EG7
Acid-base deficit: 1 mmol/L (ref 0.0–2.0)
Bicarbonate: 25.3 mmol/L (ref 20.0–28.0)
Calcium, Ion: 1.08 mmol/L — ABNORMAL LOW (ref 1.15–1.40)
HCT: 32 % — ABNORMAL LOW (ref 39.0–52.0)
Hemoglobin: 10.9 g/dL — ABNORMAL LOW (ref 13.0–17.0)
O2 Saturation: 89 %
Potassium: 3.5 mmol/L (ref 3.5–5.1)
Sodium: 136 mmol/L (ref 135–145)
TCO2: 27 mmol/L (ref 22–32)
pCO2, Ven: 48 mmHg (ref 44–60)
pH, Ven: 7.33 (ref 7.25–7.43)
pO2, Ven: 60 mmHg — ABNORMAL HIGH (ref 32–45)

## 2021-10-22 LAB — BASIC METABOLIC PANEL
Anion gap: 10 (ref 5–15)
BUN: 12 mg/dL (ref 8–23)
CO2: 20 mmol/L — ABNORMAL LOW (ref 22–32)
Calcium: 7.9 mg/dL — ABNORMAL LOW (ref 8.9–10.3)
Chloride: 111 mmol/L (ref 98–111)
Creatinine, Ser: 1.24 mg/dL (ref 0.61–1.24)
GFR, Estimated: >60 mL/min (ref >60)
Glucose, Bld: 120 mg/dL — ABNORMAL HIGH (ref 70–99)
Potassium: 4.3 mmol/L (ref 3.5–5.1)
Sodium: 141 mmol/L (ref 135–145)

## 2021-10-22 LAB — HEMOGLOBIN AND HEMATOCRIT, BLOOD
HCT: 29 % — ABNORMAL LOW (ref 39.0–52.0)
Hemoglobin: 10.3 g/dL — ABNORMAL LOW (ref 13.0–17.0)

## 2021-10-22 LAB — MAGNESIUM: Magnesium: 3 mg/dL — ABNORMAL HIGH (ref 1.7–2.4)

## 2021-10-22 LAB — ECHO INTRAOPERATIVE TEE
AV Mean grad: 2 mmHg
Height: 70 in
Weight: 3008.84 oz

## 2021-10-22 LAB — PROTIME-INR
INR: 1.4 — ABNORMAL HIGH (ref 0.8–1.2)
Prothrombin Time: 17 seconds — ABNORMAL HIGH (ref 11.4–15.2)

## 2021-10-22 LAB — APTT: aPTT: 36 seconds (ref 24–36)

## 2021-10-22 LAB — PLATELET COUNT: Platelets: 294 10*3/uL (ref 150–400)

## 2021-10-22 SURGERY — CORONARY ARTERY BYPASS GRAFTING (CABG)
Anesthesia: General | Site: Chest

## 2021-10-22 MED ORDER — NOREPINEPHRINE 4 MG/250ML-% IV SOLN
0.0000 ug/min | INTRAVENOUS | Status: DC
  Administered 2021-10-22: 2 ug/min via INTRAVENOUS
  Administered 2021-10-23: 8 ug/min via INTRAVENOUS
  Administered 2021-10-23: 10 ug/min via INTRAVENOUS
  Filled 2021-10-22: qty 500
  Filled 2021-10-22: qty 250

## 2021-10-22 MED ORDER — EPHEDRINE 5 MG/ML INJ
INTRAVENOUS | Status: AC
  Filled 2021-10-22: qty 5

## 2021-10-22 MED ORDER — CEFAZOLIN SODIUM-DEXTROSE 2-4 GM/100ML-% IV SOLN
2.0000 g | Freq: Three times a day (TID) | INTRAVENOUS | Status: AC
  Administered 2021-10-22 – 2021-10-24 (×6): 2 g via INTRAVENOUS
  Filled 2021-10-22 (×6): qty 100

## 2021-10-22 MED ORDER — 0.9 % SODIUM CHLORIDE (POUR BTL) OPTIME
TOPICAL | Status: DC | PRN
  Administered 2021-10-22: 5000 mL

## 2021-10-22 MED ORDER — ASPIRIN 81 MG PO CHEW: 324.0000 mg | CHEWABLE_TABLET | Freq: Every day | ORAL | Status: DC

## 2021-10-22 MED ORDER — VANCOMYCIN HCL 1000 MG IV SOLR
INTRAVENOUS | Status: DC | PRN
  Administered 2021-10-22: 1000 mL

## 2021-10-22 MED ORDER — DOCUSATE SODIUM 100 MG PO CAPS
200.0000 mg | ORAL_CAPSULE | Freq: Every day | ORAL | Status: DC
  Administered 2021-10-23 – 2021-11-02 (×10): 200 mg via ORAL
  Filled 2021-10-22 (×10): qty 2

## 2021-10-22 MED ORDER — ETOMIDATE 2 MG/ML IV SOLN
INTRAVENOUS | Status: DC | PRN
  Administered 2021-10-22: 16 mg via INTRAVENOUS

## 2021-10-22 MED ORDER — ACETAMINOPHEN 160 MG/5ML PO SOLN: 650.0000 mg | Freq: Once | ORAL | Status: AC

## 2021-10-22 MED ORDER — SODIUM CHLORIDE 0.9% FLUSH
10.0000 mL | Freq: Two times a day (BID) | INTRAVENOUS | Status: DC
  Administered 2021-10-22: 20 mL
  Administered 2021-10-22: 40 mL

## 2021-10-22 MED ORDER — ASPIRIN 325 MG PO TABS: 325.0000 mg | ORAL_TABLET | Freq: Once | ORAL | Status: DC

## 2021-10-22 MED ORDER — FENTANYL CITRATE (PF) 250 MCG/5ML IJ SOLN
INTRAMUSCULAR | Status: AC
  Filled 2021-10-22: qty 5

## 2021-10-22 MED ORDER — PHENYLEPHRINE 80 MCG/ML (10ML) SYRINGE FOR IV PUSH (FOR BLOOD PRESSURE SUPPORT)
PREFILLED_SYRINGE | INTRAVENOUS | Status: DC | PRN
  Administered 2021-10-22: 160 ug via INTRAVENOUS
  Administered 2021-10-22: 80 ug via INTRAVENOUS
  Administered 2021-10-22: 160 ug via INTRAVENOUS

## 2021-10-22 MED ORDER — HEPARIN SODIUM (PORCINE) 1000 UNIT/ML IJ SOLN
INTRAMUSCULAR | Status: AC
  Filled 2021-10-22: qty 1

## 2021-10-22 MED ORDER — PROTAMINE SULFATE 10 MG/ML IV SOLN
INTRAVENOUS | Status: AC
  Filled 2021-10-22: qty 25

## 2021-10-22 MED ORDER — LACTATED RINGERS IV SOLN: INTRAVENOUS | Status: DC | PRN

## 2021-10-22 MED ORDER — PHENYLEPHRINE 80 MCG/ML (10ML) SYRINGE FOR IV PUSH (FOR BLOOD PRESSURE SUPPORT)
PREFILLED_SYRINGE | INTRAVENOUS | Status: AC
  Filled 2021-10-22: qty 10

## 2021-10-22 MED ORDER — PAPAVERINE HCL 30 MG/ML IJ SOLN
INTRAMUSCULAR | Status: AC
  Filled 2021-10-22: qty 2

## 2021-10-22 MED ORDER — SODIUM CHLORIDE 0.9% FLUSH: 10.0000 mL | INTRAVENOUS | Status: DC | PRN

## 2021-10-22 MED ORDER — CHLORHEXIDINE GLUCONATE CLOTH 2 % EX PADS
6.0000 | MEDICATED_PAD | Freq: Every day | CUTANEOUS | Status: DC
  Administered 2021-10-23 – 2021-11-02 (×10): 6 via TOPICAL

## 2021-10-22 MED ORDER — FENTANYL CITRATE (PF) 250 MCG/5ML IJ SOLN
INTRAMUSCULAR | Status: DC | PRN
  Administered 2021-10-22: 100 ug via INTRAVENOUS
  Administered 2021-10-22: 50 ug via INTRAVENOUS
  Administered 2021-10-22: 100 ug via INTRAVENOUS
  Administered 2021-10-22: 200 ug via INTRAVENOUS
  Administered 2021-10-22: 100 ug via INTRAVENOUS
  Administered 2021-10-22: 200 ug via INTRAVENOUS

## 2021-10-22 MED ORDER — ACETAMINOPHEN 650 MG RE SUPP
650.0000 mg | Freq: Once | RECTAL | Status: AC
  Administered 2021-10-22: 650 mg via RECTAL

## 2021-10-22 MED ORDER — MIDAZOLAM HCL (PF) 10 MG/2ML IJ SOLN
INTRAMUSCULAR | Status: AC
  Filled 2021-10-22: qty 2

## 2021-10-22 MED ORDER — METOPROLOL TARTRATE 25 MG/10 ML ORAL SUSPENSION: 12.5000 mg | Freq: Two times a day (BID) | ORAL | Status: DC

## 2021-10-22 MED ORDER — POTASSIUM CHLORIDE 10 MEQ/50ML IV SOLN
10.0000 meq | INTRAVENOUS | Status: AC
  Administered 2021-10-22 (×3): 10 meq via INTRAVENOUS

## 2021-10-22 MED ORDER — LACTATED RINGERS IV SOLN
500.0000 mL | Freq: Once | INTRAVENOUS | Status: AC | PRN
  Administered 2021-10-22: 500 mL via INTRAVENOUS

## 2021-10-22 MED ORDER — MIDAZOLAM HCL 2 MG/2ML IJ SOLN: 2.0000 mg | INTRAMUSCULAR | Status: DC | PRN

## 2021-10-22 MED ORDER — SODIUM CHLORIDE 0.9% FLUSH
10.0000 mL | Freq: Two times a day (BID) | INTRAVENOUS | Status: DC
  Administered 2021-10-23 – 2021-10-27 (×8): 10 mL

## 2021-10-22 MED ORDER — INSULIN REGULAR(HUMAN) IN NACL 100-0.9 UT/100ML-% IV SOLN: INTRAVENOUS | Status: DC

## 2021-10-22 MED ORDER — ETOMIDATE 2 MG/ML IV SOLN
INTRAVENOUS | Status: AC
  Filled 2021-10-22: qty 10

## 2021-10-22 MED ORDER — ALBUMIN HUMAN 5 % IV SOLN: INTRAVENOUS | Status: DC | PRN

## 2021-10-22 MED ORDER — PHENYLEPHRINE HCL (PRESSORS) 10 MG/ML IV SOLN
INTRAVENOUS | Status: AC
  Filled 2021-10-22: qty 1

## 2021-10-22 MED ORDER — ORAL CARE MOUTH RINSE: 15.0000 mL | OROMUCOSAL | Status: DC | PRN

## 2021-10-22 MED ORDER — ROCURONIUM BROMIDE 10 MG/ML (PF) SYRINGE
PREFILLED_SYRINGE | INTRAVENOUS | Status: DC | PRN
  Administered 2021-10-22: 100 mg via INTRAVENOUS
  Administered 2021-10-22: 50 mg via INTRAVENOUS
  Administered 2021-10-22: 30 mg via INTRAVENOUS
  Administered 2021-10-22: 50 mg via INTRAVENOUS

## 2021-10-22 MED ORDER — SODIUM BICARBONATE 8.4 % IV SOLN
100.0000 meq | Freq: Once | INTRAVENOUS | Status: AC
  Administered 2021-10-22: 100 meq via INTRAVENOUS

## 2021-10-22 MED ORDER — VASOPRESSIN 20 UNITS/100 ML INFUSION FOR SHOCK: 0.0000 [IU]/min | INTRAVENOUS | Status: DC

## 2021-10-22 MED ORDER — ONDANSETRON HCL 4 MG/2ML IJ SOLN
4.0000 mg | Freq: Four times a day (QID) | INTRAMUSCULAR | Status: DC | PRN
  Administered 2021-10-22 – 2021-10-24 (×4): 4 mg via INTRAVENOUS
  Filled 2021-10-22 (×4): qty 2

## 2021-10-22 MED ORDER — DEXTROSE 50 % IV SOLN
0.0000 mL | INTRAVENOUS | Status: DC | PRN
  Administered 2021-10-24 (×2): 25 mL via INTRAVENOUS
  Filled 2021-10-22: qty 50

## 2021-10-22 MED ORDER — CHLORHEXIDINE GLUCONATE 0.12 % MT SOLN
15.0000 mL | OROMUCOSAL | Status: AC
  Administered 2021-10-22: 15 mL via OROMUCOSAL
  Filled 2021-10-22: qty 15

## 2021-10-22 MED ORDER — PLASMA-LYTE A IV SOLN
INTRAVENOUS | Status: DC | PRN
  Administered 2021-10-22: 500 mL via INTRAVASCULAR

## 2021-10-22 MED ORDER — PROPOFOL 10 MG/ML IV BOLUS
INTRAVENOUS | Status: AC
  Filled 2021-10-22: qty 20

## 2021-10-22 MED ORDER — BISACODYL 10 MG RE SUPP: 10.0000 mg | Freq: Every day | RECTAL | Status: DC

## 2021-10-22 MED ORDER — METOPROLOL TARTRATE 5 MG/5ML IV SOLN: 2.5000 mg | INTRAVENOUS | Status: DC | PRN

## 2021-10-22 MED ORDER — MAGNESIUM SULFATE 4 GM/100ML IV SOLN
4.0000 g | Freq: Once | INTRAVENOUS | Status: AC
  Administered 2021-10-22: 4 g via INTRAVENOUS
  Filled 2021-10-22: qty 100

## 2021-10-22 MED ORDER — EPINEPHRINE HCL 5 MG/250ML IV SOLN IN NS
0.5000 ug/min | INTRAVENOUS | Status: DC
  Administered 2021-10-23: 5 ug/min via INTRAVENOUS
  Administered 2021-10-24: 2 ug/min via INTRAVENOUS
  Administered 2021-10-26: 1 ug/min via INTRAVENOUS
  Filled 2021-10-22 (×4): qty 250

## 2021-10-22 MED ORDER — ALBUMIN HUMAN 5 % IV SOLN
250.0000 mL | INTRAVENOUS | Status: AC | PRN
  Administered 2021-10-22 (×4): 12.5 g via INTRAVENOUS
  Filled 2021-10-22 (×2): qty 250

## 2021-10-22 MED ORDER — METOPROLOL TARTRATE 12.5 MG HALF TABLET: 12.5000 mg | ORAL_TABLET | Freq: Two times a day (BID) | ORAL | Status: DC

## 2021-10-22 MED ORDER — HEPARIN SODIUM (PORCINE) 1000 UNIT/ML IJ SOLN
INTRAMUSCULAR | Status: DC | PRN
  Administered 2021-10-22: 28000 [IU] via INTRAVENOUS
  Administered 2021-10-22: 10000 [IU] via INTRAVENOUS

## 2021-10-22 MED ORDER — PAPAVERINE HCL 30 MG/ML IJ SOLN
INTRAMUSCULAR | Status: DC | PRN
  Administered 2021-10-22: 2 mL via INTRAVENOUS

## 2021-10-22 MED ORDER — ROCURONIUM BROMIDE 10 MG/ML (PF) SYRINGE
PREFILLED_SYRINGE | INTRAVENOUS | Status: AC
  Filled 2021-10-22: qty 20

## 2021-10-22 MED ORDER — PROTAMINE SULFATE 10 MG/ML IV SOLN
INTRAVENOUS | Status: DC | PRN
  Administered 2021-10-22: 300 mg via INTRAVENOUS

## 2021-10-22 MED ORDER — PANTOPRAZOLE SODIUM 40 MG PO TBEC
40.0000 mg | DELAYED_RELEASE_TABLET | Freq: Every day | ORAL | Status: DC
  Administered 2021-10-24 – 2021-11-02 (×10): 40 mg via ORAL
  Filled 2021-10-22 (×11): qty 1

## 2021-10-22 MED ORDER — HEPARIN SODIUM (PORCINE) 1000 UNIT/ML IJ SOLN
INTRAMUSCULAR | Status: AC
  Filled 2021-10-22: qty 10

## 2021-10-22 MED ORDER — SODIUM CHLORIDE 0.9% FLUSH
3.0000 mL | INTRAVENOUS | Status: DC | PRN
  Administered 2021-10-26: 3 mL via INTRAVENOUS

## 2021-10-22 MED ORDER — MIDAZOLAM HCL (PF) 5 MG/ML IJ SOLN
INTRAMUSCULAR | Status: DC | PRN
  Administered 2021-10-22: 1 mg via INTRAVENOUS
  Administered 2021-10-22: 2 mg via INTRAVENOUS
  Administered 2021-10-22 (×2): 1 mg via INTRAVENOUS
  Administered 2021-10-22: 2 mg via INTRAVENOUS

## 2021-10-22 MED ORDER — SODIUM CHLORIDE 0.9 % IV SOLN: INTRAVENOUS | Status: DC

## 2021-10-22 MED ORDER — NITROGLYCERIN IN D5W 200-5 MCG/ML-% IV SOLN: 0.0000 ug/min | INTRAVENOUS | Status: DC

## 2021-10-22 MED ORDER — ORAL CARE MOUTH RINSE: 15.0000 mL | OROMUCOSAL | Status: DC

## 2021-10-22 MED ORDER — ACETAMINOPHEN 500 MG PO TABS
1000.0000 mg | ORAL_TABLET | Freq: Four times a day (QID) | ORAL | Status: AC
  Administered 2021-10-23 – 2021-10-27 (×18): 1000 mg via ORAL
  Filled 2021-10-22 (×17): qty 2

## 2021-10-22 MED ORDER — LACTATED RINGERS IV SOLN: INTRAVENOUS | Status: DC

## 2021-10-22 MED ORDER — SODIUM CHLORIDE 0.9% FLUSH
3.0000 mL | Freq: Two times a day (BID) | INTRAVENOUS | Status: DC
  Administered 2021-10-23 – 2021-11-01 (×17): 3 mL via INTRAVENOUS

## 2021-10-22 MED ORDER — MORPHINE SULFATE (PF) 2 MG/ML IV SOLN
1.0000 mg | INTRAVENOUS | Status: DC | PRN
  Administered 2021-10-23: 2 mg via INTRAVENOUS
  Administered 2021-10-23: 4 mg via INTRAVENOUS
  Filled 2021-10-22: qty 2
  Filled 2021-10-22: qty 1

## 2021-10-22 MED ORDER — FAMOTIDINE IN NACL 20-0.9 MG/50ML-% IV SOLN
20.0000 mg | Freq: Two times a day (BID) | INTRAVENOUS | Status: DC
  Administered 2021-10-22: 20 mg via INTRAVENOUS
  Filled 2021-10-22: qty 50

## 2021-10-22 MED ORDER — SODIUM CHLORIDE 0.9 % IV SOLN
250.0000 mL | INTRAVENOUS | Status: DC
  Administered 2021-10-23: 250 mL via INTRAVENOUS

## 2021-10-22 MED ORDER — ACETAMINOPHEN 160 MG/5ML PO SOLN: 1000.0000 mg | Freq: Four times a day (QID) | ORAL | Status: AC

## 2021-10-22 MED ORDER — ASPIRIN 325 MG PO TBEC
325.0000 mg | DELAYED_RELEASE_TABLET | Freq: Every day | ORAL | Status: DC
  Administered 2021-10-23 – 2021-10-24 (×2): 325 mg via ORAL
  Filled 2021-10-22 (×2): qty 1

## 2021-10-22 MED ORDER — CHLORHEXIDINE GLUCONATE 0.12 % MT SOLN
15.0000 mL | Freq: Once | OROMUCOSAL | Status: AC
  Administered 2021-10-22: 15 mL via OROMUCOSAL

## 2021-10-22 MED ORDER — LACTATED RINGERS IV BOLUS: 500.0000 mL | Freq: Once | INTRAVENOUS | Status: AC

## 2021-10-22 MED ORDER — CHLORHEXIDINE GLUCONATE CLOTH 2 % EX PADS
6.0000 | MEDICATED_PAD | Freq: Every day | CUTANEOUS | Status: DC
  Administered 2021-10-22: 6 via TOPICAL

## 2021-10-22 MED ORDER — PROTAMINE SULFATE 10 MG/ML IV SOLN
INTRAVENOUS | Status: AC
  Filled 2021-10-22: qty 5

## 2021-10-22 MED ORDER — VANCOMYCIN HCL IN DEXTROSE 1-5 GM/200ML-% IV SOLN
1000.0000 mg | Freq: Once | INTRAVENOUS | Status: AC
  Administered 2021-10-22: 1000 mg via INTRAVENOUS
  Filled 2021-10-22: qty 200

## 2021-10-22 MED ORDER — SODIUM CHLORIDE (PF) 0.9 % IJ SOLN
INTRAMUSCULAR | Status: AC
  Filled 2021-10-22: qty 10

## 2021-10-22 MED ORDER — OXYCODONE HCL 5 MG PO TABS
5.0000 mg | ORAL_TABLET | ORAL | Status: DC | PRN
  Administered 2021-10-23: 10 mg via ORAL
  Administered 2021-10-23: 5 mg via ORAL
  Administered 2021-10-23: 10 mg via ORAL
  Administered 2021-10-25: 5 mg via ORAL
  Filled 2021-10-22: qty 1
  Filled 2021-10-22 (×2): qty 2
  Filled 2021-10-22: qty 1

## 2021-10-22 MED ORDER — DEXMEDETOMIDINE HCL IN NACL 400 MCG/100ML IV SOLN
0.0000 ug/kg/h | INTRAVENOUS | Status: DC
  Filled 2021-10-22: qty 100

## 2021-10-22 MED ORDER — BISACODYL 5 MG PO TBEC
10.0000 mg | DELAYED_RELEASE_TABLET | Freq: Every day | ORAL | Status: DC
  Administered 2021-10-23 – 2021-11-02 (×10): 10 mg via ORAL
  Filled 2021-10-22 (×11): qty 2

## 2021-10-22 MED ORDER — SODIUM CHLORIDE 0.45 % IV SOLN: INTRAVENOUS | Status: DC | PRN

## 2021-10-22 MED ORDER — PHENYLEPHRINE HCL-NACL 20-0.9 MG/250ML-% IV SOLN
0.0000 ug/min | INTRAVENOUS | Status: DC
  Administered 2021-10-22: 80 ug/min via INTRAVENOUS
  Administered 2021-10-23: 55 ug/min via INTRAVENOUS
  Filled 2021-10-22 (×3): qty 250

## 2021-10-22 MED ORDER — TRAMADOL HCL 50 MG PO TABS
50.0000 mg | ORAL_TABLET | ORAL | Status: DC | PRN
  Administered 2021-10-23: 100 mg via ORAL
  Filled 2021-10-22: qty 2

## 2021-10-22 SURGICAL SUPPLY — 110 items
ADAPTER CARDIO PERF ANTE/RETRO (ADAPTER) IMPLANT
ADPR PRFSN 84XANTGRD RTRGD (ADAPTER) ×2
BAG DECANTER FOR FLEXI CONT (MISCELLANEOUS) ×3 IMPLANT
BLADE CLIPPER SURG (BLADE) IMPLANT
BLADE MICRO SHARP 3 15 DEG (BLADE) ×3 IMPLANT
BLADE STERNUM SYSTEM 6 (BLADE) ×3 IMPLANT
BLADE SURG 11 STRL SS (BLADE) IMPLANT
BLADE SURG 15 STRL LF DISP TIS (BLADE) IMPLANT
BLADE SURG 15 STRL SS (BLADE) ×2
BNDG CMPR MED 10X6 ELC LF (GAUZE/BANDAGES/DRESSINGS) ×2
BNDG ELASTIC 4X5.8 VLCR STR LF (GAUZE/BANDAGES/DRESSINGS) ×3 IMPLANT
BNDG ELASTIC 6X10 VLCR STRL LF (GAUZE/BANDAGES/DRESSINGS) IMPLANT
BNDG ELASTIC 6X5.8 VLCR STR LF (GAUZE/BANDAGES/DRESSINGS) ×3 IMPLANT
BNDG GAUZE DERMACEA FLUFF 4 (GAUZE/BANDAGES/DRESSINGS) ×3 IMPLANT
BNDG GZE DERMACEA 4 6PLY (GAUZE/BANDAGES/DRESSINGS) ×2
BOOT SUTURE AID YELLOW STND (SUTURE) ×3 IMPLANT
CANISTER SUCT 3000ML PPV (MISCELLANEOUS) ×3 IMPLANT
CANNULA AORTIC ROOT 9FR (CANNULA) IMPLANT
CANNULA CARDIOPLEGIA 14FRX32 (CANNULA) IMPLANT
CANNULA NON VENT 22FR 12 (CANNULA) ×3 IMPLANT
CANNULA SUMP PERICARDIAL (CANNULA) IMPLANT
CANNULA VESSEL 3MM BLUNT TIP (CANNULA) IMPLANT
CATH HEART VENT LEFT (CATHETERS) IMPLANT
CATH RETROPLEGIA CORONARY 14FR (CATHETERS) ×3 IMPLANT
CATH ROBINSON RED A/P 18FR (CATHETERS) ×9 IMPLANT
CATH THOR STR 32F SOFT 20 RADI (CATHETERS) ×6 IMPLANT
CATH THORACIC 28FR RT ANG (CATHETERS) ×3 IMPLANT
CLAMP ISOLATOR SYNERGY LG (MISCELLANEOUS) IMPLANT
CLIP TI LARGE 6 (CLIP) ×3 IMPLANT
CLIP VESOCCLUDE MED 24/CT (CLIP) IMPLANT
CLIP VESOCCLUDE SM WIDE 24/CT (CLIP) IMPLANT
CNTNR URN SCR LID CUP LEK RST (MISCELLANEOUS) IMPLANT
CONN 1/2X1/2X1/2  BEN (MISCELLANEOUS) ×2
CONN 1/2X1/2X1/2 BEN (MISCELLANEOUS) IMPLANT
CONN ST 1/2X1/2  BEN (MISCELLANEOUS) ×2
CONN ST 1/2X1/2 BEN (MISCELLANEOUS) IMPLANT
CONN ST 3/8 X 1/2 (MISCELLANEOUS) IMPLANT
CONT SPEC 4OZ CLIKSEAL STRL BL (MISCELLANEOUS) IMPLANT
CONT SPEC 4OZ STRL OR WHT (MISCELLANEOUS) ×2
CONTAINER PROTECT SURGISLUSH (MISCELLANEOUS) ×9 IMPLANT
DEFOGGER ANTIFOG KIT (MISCELLANEOUS) IMPLANT
DEVICE SUT CK QUICK LOAD MINI (Prosthesis & Implant Heart) IMPLANT
DRAPE CARDIOVASCULAR INCISE (DRAPES) ×2
DRAPE INCISE IOBAN 66X45 STRL (DRAPES) ×3 IMPLANT
DRAPE SRG 135X102X78XABS (DRAPES) ×3 IMPLANT
DRAPE WARM FLUID 44X44 (DRAPES) ×3 IMPLANT
DRESSING AQUACEL AG ADV 3.5X12 (MISCELLANEOUS) ×3 IMPLANT
DRSG AQUACEL AG ADV 3.5X10 (GAUZE/BANDAGES/DRESSINGS) IMPLANT
DRSG AQUACEL AG ADV 3.5X12 (MISCELLANEOUS) ×2
DRSG AQUACEL AG ADV 3.5X14 (GAUZE/BANDAGES/DRESSINGS) IMPLANT
ELECT BLADE 4.0 EZ CLEAN MEGAD (MISCELLANEOUS) ×2
ELECT CAUTERY BLADE 6.4 (BLADE) ×3 IMPLANT
ELECT REM PT RETURN 9FT ADLT (ELECTROSURGICAL) ×4
ELECTRODE BLDE 4.0 EZ CLN MEGD (MISCELLANEOUS) ×3 IMPLANT
ELECTRODE REM PT RTRN 9FT ADLT (ELECTROSURGICAL) ×6 IMPLANT
FELT TEFLON 1X6 (MISCELLANEOUS) ×3 IMPLANT
GAUZE SPONGE 4X4 12PLY STRL (GAUZE/BANDAGES/DRESSINGS) ×6 IMPLANT
GLOVE BIO SURGEON STRL SZ 6 (GLOVE) IMPLANT
GLOVE BIO SURGEON STRL SZ 6.5 (GLOVE) IMPLANT
GLOVE BIOGEL PI IND STRL 6.5 (GLOVE) IMPLANT
GLOVE SS BIOGEL STRL SZ 7 (GLOVE) IMPLANT
GOWN STRL REUS W/ TWL LRG LVL3 (GOWN DISPOSABLE) ×18 IMPLANT
GOWN STRL REUS W/TWL LRG LVL3 (GOWN DISPOSABLE) ×12
INSERT FOGARTY 61MM (MISCELLANEOUS) IMPLANT
IV CATH 22GX1 FEP (IV SOLUTION) IMPLANT
KIT BASIN OR (CUSTOM PROCEDURE TRAY) ×3 IMPLANT
KIT CATH CPB BARTLE (MISCELLANEOUS) ×3 IMPLANT
KIT SUT CK MINI COMBO 4X17 (Prosthesis & Implant Heart) IMPLANT
KIT TURNOVER KIT B (KITS) ×3 IMPLANT
KIT VASOVIEW HEMOPRO 2 VH 4000 (KITS) ×3 IMPLANT
MARKER GRAFT CORONARY BYPASS (MISCELLANEOUS) IMPLANT
NS IRRIG 1000ML POUR BTL (IV SOLUTION) ×15 IMPLANT
PACK E OPEN HEART (SUTURE) ×3 IMPLANT
PACK OPEN HEART (CUSTOM PROCEDURE TRAY) ×3 IMPLANT
PAD ARMBOARD 7.5X6 YLW CONV (MISCELLANEOUS) ×6 IMPLANT
PAD ELECT DEFIB RADIOL ZOLL (MISCELLANEOUS) ×3 IMPLANT
PENCIL BUTTON HOLSTER BLD 10FT (ELECTRODE) ×3 IMPLANT
POSITIONER HEAD DONUT 9IN (MISCELLANEOUS) ×3 IMPLANT
PUNCH AORTIC ROTATE 4.0MM (MISCELLANEOUS) IMPLANT
PUNCH AORTIC ROTATE 4.5MM 8IN (MISCELLANEOUS) IMPLANT
RING MCCARTHY ADAMS M30 (Prosthesis & Implant Heart) IMPLANT
SET MPS 3-ND DEL (MISCELLANEOUS) IMPLANT
SUPPORT HEART JANKE-BARRON (MISCELLANEOUS) ×3 IMPLANT
SUT ETHIBOND 3 0 SH 1 (SUTURE) IMPLANT
SUT MNCRL AB 4-0 PS2 18 (SUTURE) ×6 IMPLANT
SUT PROLENE 4 0 RB 1 (SUTURE) ×10
SUT PROLENE 4 0 SH DA (SUTURE) ×3 IMPLANT
SUT PROLENE 4-0 RB1 .5 CRCL 36 (SUTURE) ×3 IMPLANT
SUT PROLENE 5 0 RB 2 (SUTURE) IMPLANT
SUT PROLENE 6 0 C 1 30 (SUTURE) IMPLANT
SUT PROLENE 7 0 BV1 MDA (SUTURE) ×3 IMPLANT
SUT SILK 1 TIES 10X30 (SUTURE) IMPLANT
SUT STEEL SZ 6 DBL 3X14 BALL (SUTURE) ×6 IMPLANT
SUT VIC AB 0 CTX 36 (SUTURE) ×4
SUT VIC AB 0 CTX36XBRD ANTBCTR (SUTURE) ×6 IMPLANT
SUT VIC AB 2-0 CT1 27 (SUTURE) ×6
SUT VIC AB 2-0 CT1 TAPERPNT 27 (SUTURE) ×6 IMPLANT
SYR 20CC LL (SYRINGE) IMPLANT
SYR 20ML LL LF (SYRINGE) IMPLANT
SYR BULB IRRIG 60ML STRL (SYRINGE) IMPLANT
SYSTEM SAHARA CHEST DRAIN ATS (WOUND CARE) ×3 IMPLANT
TAPE CLOTH 4X10 WHT NS (GAUZE/BANDAGES/DRESSINGS) IMPLANT
TAPE PAPER 2X10 WHT MICROPORE (GAUZE/BANDAGES/DRESSINGS) IMPLANT
TOWEL GREEN STERILE (TOWEL DISPOSABLE) ×3 IMPLANT
TOWEL GREEN STERILE FF (TOWEL DISPOSABLE) ×3 IMPLANT
TRAY FOLEY SLVR 16FR TEMP STAT (SET/KITS/TRAYS/PACK) ×3 IMPLANT
TUBING LAP HI FLOW INSUFFLATIO (TUBING) ×3 IMPLANT
UNDERPAD 30X36 HEAVY ABSORB (UNDERPADS AND DIAPERS) ×3 IMPLANT
VENT LEFT HEART 12002 (CATHETERS) ×2
WATER STERILE IRR 1000ML POUR (IV SOLUTION) ×6 IMPLANT

## 2021-10-22 NOTE — Procedures (Signed)
Extubation Procedure Note  Patient Details:   Name: Jon [name] DOB: 1945-12-09 MRN: [id number]   Airway Documentation:    Vent end date: 10/22/21 Vent end time: 2131   Evaluation  O2 sats: stable throughout Complications: No apparent complications Patient did tolerate procedure well. Bilateral Breath Sounds: Clear, Diminished   Patient able to speak post- extubation: Yes  Extubation procedure was clearly explained to the patient. Patient nodded head for understanding. Patient was extubated to a 3L [place] with bubble humidification. Patient tolerated the procedure well. No complications noted. No stridor noted.   Leigh Aurora, BS, RRT-ACCS, RCP 10/22/2021, 9:32 PM

## 2021-10-22 NOTE — Progress Notes (Signed)
Patient passed all pulmonary mechanics.   NIF: -22 FVC: 1.21L  Audible cuff leak noted.

## 2021-10-22 NOTE — Hospital Course (Addendum)
History of Present Illness:     Jon [name] is a 76 year old male with a past medical history of hypertension, diabetes mellitus (type II), OSA on CPAP, tobacco abuse, newly diagnosed paroxysmal atrial fibrillation who presented to [facility] ED on 10/13/2021 with complaints of his chest burning and feeling "dry". Of note, he was initially going to leave the ED because "he had things that had to be done";however, he became pale, diaphoretic, had lightheadedness, and an apparent syncopal episode. and decided he should stay. Initial Troponin I (high sensitivity) was 11 and peaked at 15,166. EKG showed a flutter with  minimal ST elevation in inferior lead and ST depression in V1-V3. He ruled in for a STEMI. Echo done 10/13/2021 showed LVEF 60-65%, mild MR, severe calcification of the aortic valve with some aortic stenosis. Cardiac catheterization done 10/13/2021 showed severe proximal and mid LAD stenosis with tandem calcific lesions and total occlusion of right posterolateral branch with thrombus. Another echo was done today and showed mild AS, peak gradient 14.3 mmHg. Interventional cardiology treated the proximal LAD lesion with balloon angioplasty but the leston could not be crossed for atherectomy. Cardiothoracic consultation was requested with Dr. Lavonna Monarch for the consideration of coronary artery bypass grafting surgery.    Patient lives alone and is fairly active (walks a few miles daily). At the time of my exam, patient had no complaint of chest pain, pressure or shortness of breath.   Dr. Lavonna Monarch reviewed the patient's chart, labs and diagnostic studies and determined that coronary artery bypass grafting would provide the best long term treatment for this patient. He discussed his treatment options as well as the risks and benefits of surgery with the patient. After careful consideration Jon [name] agreed to move forward with surgical revascularization.  Hospital Course: Jon [name] was an  inpatient at [facility] and was brought to the operating room on 10/22/2021. During intraoperative TEE it was determined his mitral valve regurgitation required repair. He underwent a CABG x 3 utilizing SVG to PDA sequentially to PLA, LIMA to LAD, he also underwent a Maze procedure and a mitral valve repair utilizing a 33m Mccarthy-Adams ring. He tolerated the procedure well and was transferred to the SICU in stable condition.  He required neo-synephrine for blood pressure support overnight.  He require Epi and Levophed to help with hemodynamics.  All drips were weaned as tolerated.  He was extubated without difficulty.  He was started on Midodrine to help wean pressors.  He developed worsening respiratory status and was placed on BIPAP.   He required temporary pacing with underlying junctional rhythm in the 50s and therefore he was not started on a BB at this time. He was volume overloaded and started on Demadex.  His chest tubes were removed without difficulty.     The patient continued to require support for BP.  Echocardiogram was obtained and showed normal EF.  His MV Repair showed trivial mitral regurgitation.  He was started on Vasopressin for additional support.   He was started on coumadin for his MV Repair.  He developed Atrial Fibrillation overnight.  Due to persistent arrhythmias, EP consult was obtained.  They did not feel PPM placement was indicated.  The patient was found to be in NSR with some mild intraventricular delay.  The patient had issues with hypoglycemia.  His long acting insulin was discontinued and sliding scale coverage was adjusted.  The patient was stable for transfer to the progressive care unit on 10/28/2021.  He  continues to have some PACs and PVCs with some A-fib/flutter.  EP continues to hope to avoid nodal blocking agents for a couple months.  On postop day 7 INR is noted to be 2.3 and Coumadin is being held with hopes to remove epicardial pacing wires the following day.   AV wires were removed on 10/31/2021 and Coumadin was resumed.

## 2021-10-22 NOTE — Consult Note (Signed)
NAME:  [name], MRN:  [id number], DOB:  06/12/1945, LOS: 9 ADMISSION DATE:  10/13/2021, CONSULTATION DATE:  10/22/21 REFERRING MD:  Lavonna Monarch - CVTS, CHIEF COMPLAINT:  s/p  CABG   History of Present Illness:  76 yo M PMH HTN, pAFib not on AC, OSA, hx tobacco use, DM2 presented 9/13  chest pain + near syncope initially leaving the ED AMA but became too dizzy and SOB to elope. Admitted to [facility] with NSTEMI, cards consulted 9/13 and went for cath unsuccessful PCI RPLV. 9/15 unsuccessful PCI LAD. CVTS consult 9/15 for operative evaluation.   Underwent CABG x 3, MAZE, MVR 9/22 and transferred to ICU after case   PCCM is consulted postoperatively in this setting   Pertinent  Medical History  Afib OSA on CPAP Tobacco use HTN HLD  CAD Mitral insufficiency  Significant Hospital Events: Including procedures, antibiotic start and stop dates in addition to other pertinent events   9/13 admit to [facility] w inferior STEMI. Cath lab with incomplete PCI RPLV 9/15 unsuccessful PCI LAD. CVTS consult 9/22 CABG x 3, MAZE, MVR   Interim History / Subjective:  POD0 CABG x3, MAZE, MVR  Arrives to ICU intubated sedated on pressors   Cross clamp: 99 min Total pump time: 158 min  EBL about 1268m. Received + 665 cellsaver, as well as crystalloid and colloid.   Temp 35.9 on arrival  Objective   Blood pressure 90/69, pulse (!) 52, temperature (!) 97.1 F (36.2 C), temperature source Axillary, resp. rate 12, height '5\' 10"'$  (1.778 m), weight 85.3 kg, SpO2 99 %. PAP: (2-35)/(-12-19) 2/-11      Intake/Output Summary (Last 24 hours) at 10/22/2021 1403 Last data filed at 10/22/2021 1310 Gross per 24 hour  Intake 3757.19 ml  Output 3910 ml  Net -152.81 ml   Filed Weights   10/20/21 0624 10/21/21 0633 10/22/21 0548  Weight: 86 kg 85.5 kg 85.3 kg    Examination: General: critically ill older adult M intubated sedated  HENT: NCAT pink mm eyes taped. ETT taped  Lungs: Symmetrical chest expansion.  Mechanically ventilated.  Cardiovascular: rate 80, paced. Serosanguinous mediastinal drain output. Mediastinal dressing c/d/intact. Pacing wires secure  Abdomen: soft ndnt  Extremities: No acute joint deformity, no cyanosis or clubbing  Neuro: sedated  GU: foley, clear yellow urine   Resolved Hospital Problem list     Assessment & Plan:   PCCM is consulted 10/22/21 POD0 CABG x3, MAZE, MVR for post-op ICU management    Inferior STEMI CAD s/p CABG x3 Mitral insufficiency s/p MVR pAfib s/p MAZE  Shock -- cardiogenic +/- vasoplegia +/- medication related P -post op pathway as per CVTS -follow chest tube output -follow CI/CO -- neo and epi for goal -crystalloid, colloid per protocol -insulin gtt  -follow UOP, renal indices  -ancef ppx   Expected post op vent management Small L pleural effusion  P -likely rapid wean later today  -ABG  -VAP, pulm hygiene -PAD   Expected post op ABLA in setting of cardiac surgeries above P -If labs reveal transfusion indication, please d/w CVTS/ Dr. WLavonna Monarch Dm2 -insulin gtt post op   Best Practice (right click and "Reselect all SmartList Selections" daily)   Diet/type: NPO DVT prophylaxis: not indicated GI prophylaxis: H2B Lines: Central line, Arterial Line, and yes and it is still needed Foley:  Yes, and it is still needed Code Status:  full code Last date of multidisciplinary goals of care discussion [--]  Labs   CBC: Recent  Labs  Lab 10/19/21 0630 10/20/21 0639 10/21/21 0410 10/21/21 1207 10/22/21 0613 10/22/21 0813 10/22/21 1037 10/22/21 1104 10/22/21 1118 10/22/21 1131 10/22/21 1225  WBC 9.6 8.7 9.5 8.0 10.0  --   --   --   --   --   --   HGB 13.4 13.9 13.1 14.9 15.3   < > 10.2* 10.9* 10.3* 9.5* 9.2*  HCT 38.8* 40.6 38.7* 44.4 44.3   < > 30.0* 32.0* 29.0* 28.0* 27.0*  MCV 85.3 85.5 87.0 87.4 86.2  --   --   --   --   --   --   PLT 387 409* 430* 486* 526*  --   --   --  294  --   --    < > = values in this  interval not displayed.    Basic Metabolic Panel: Recent Labs  Lab 10/16/21 0557 10/17/21 0755 10/21/21 1207 10/22/21 0813 10/22/21 0914 10/22/21 0938 10/22/21 1012 10/22/21 1037 10/22/21 1104 10/22/21 1131 10/22/21 1225  NA 137 136 138 138 138   < > 135 137 138 135 138  K 4.0 3.8 4.0 4.0 3.9   < > 4.5 4.0 3.6 4.5 3.9  CL 105 105 105 102 102  --  102  --  101  --  102  CO2 21* 23 27  --   --   --   --   --   --   --   --   GLUCOSE 108* 181* 109* 136* 129*  --  145*  --  135*  --  125*  BUN '12 12 16 15 15  '$ --  13  --  13  --  13  CREATININE 1.35* 1.41* 1.41* 1.10 1.00  --  0.90  --  0.80  --  0.90  CALCIUM 9.1 9.2 9.8  --   --   --   --   --   --   --   --   MG 1.9 1.9  --   --   --   --   --   --   --   --   --   PHOS 2.9 3.3  --   --   --   --   --   --   --   --   --    < > = values in this interval not displayed.   GFR: Estimated Creatinine Clearance: 72.1 mL/min (by C-G formula based on SCr of 0.9 mg/dL). Recent Labs  Lab 10/20/21 0639 10/21/21 0410 10/21/21 1207 10/22/21 0613  WBC 8.7 9.5 8.0 10.0    Liver Function Tests: Recent Labs  Lab 10/16/21 0557 10/17/21 0755 10/21/21 1207  AST  --   --  30  ALT  --   --  29  ALKPHOS  --   --  49  BILITOT  --   --  0.8  PROT  --   --  7.3  ALBUMIN 3.4* 3.6 4.0   No results for input(s): "LIPASE", "AMYLASE" in the last 168 hours. No results for input(s): "AMMONIA" in the last 168 hours.  ABG    Component Value Date/Time   PHART 7.497 (H) 10/22/2021 1131   PCO2ART 33.9 10/22/2021 1131   PO2ART 360 (H) 10/22/2021 1131   HCO3 26.2 10/22/2021 1131   TCO2 25 10/22/2021 1225   ACIDBASEDEF 1.0 10/22/2021 0944   O2SAT 100 10/22/2021 1131     Coagulation Profile: No results for input(s): "INR", "  PROTIME" in the last 168 hours.  Cardiac Enzymes: No results for input(s): "CKTOTAL", "CKMB", "CKMBINDEX", "TROPONINI" in the last 168 hours.  HbA1C: Hgb A1c MFr Bld  Date/Time Value Ref Range Status  09/02/2021  09:54 AM 7.1 (H) 4.6 - 6.5 % Final    Comment:    Glycemic Control Guidelines for People with Diabetes:Non Diabetic:  <6%Goal of Therapy: <7%Additional Action Suggested:  >8%   05/12/2021 09:02 AM 6.8 (H) 4.6 - 6.5 % Final    Comment:    Glycemic Control Guidelines for People with Diabetes:Non Diabetic:  <6%Goal of Therapy: <7%Additional Action Suggested:  >8%     CBG: Recent Labs  Lab 10/21/21 1140 10/21/21 1618 10/21/21 2148 10/22/21 0604 10/22/21 1341  GLUCAP 70 103* 111* 63* 113*    Review of Systems:   Unable to obtain, intubated sedated   Past Medical History:  He,  has a past medical history of Acute intractable headache (09/02/2021), Adenomatous colon polyp, Anemia, Atrial fibrillation ([place]), Cataract, Diabetes mellitus without complication ([place]), Diverticulosis, 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 pylori infection (2011), Heart murmur, History of radiation therapy (09/20/10 thru 10/29/10), Hypertension, Neuromuscular disorder ([place]), Other and unspecified hyperlipidemia, Sleep apnea, Status post chemotherapy (09/22/10 thru 10/27/10), and Unspecified sleep apnea.   Surgical History:   Past Surgical History:  Procedure Laterality Date   CATARACT EXTRACTION     bilateral   COLONOSCOPY     CORONARY BALLOON ANGIOPLASTY N/A 10/13/2021   Procedure: CORONARY BALLOON ANGIOPLASTY;  Surgeon: Sherren Mocha, MD;  Location: [place] [facility];  Service: Cardiovascular;  Laterality: N/A;   CORONARY BALLOON ANGIOPLASTY N/A 10/15/2021   Procedure: CORONARY BALLOON ANGIOPLASTY;  Surgeon: [name], MD;  Location: [place] [facility];  Service: Cardiovascular;  Laterality: N/A;   ELBOW BURSA SURGERY     LEFT HEART CATH AND CORONARY ANGIOGRAPHY N/A 10/13/2021   Procedure: LEFT HEART CATH AND CORONARY ANGIOGRAPHY;  Surgeon: Sherren Mocha, MD;  Location: [place] [facility];  Service: Cardiovascular;  Laterality: N/A;   LEFT HEART CATH  AND CORONARY ANGIOGRAPHY N/A 10/15/2021   Procedure: LEFT HEART CATH AND CORONARY ANGIOGRAPHY;  Surgeon: [name], MD;  Location: [place] [facility];  Service: Cardiovascular;  Laterality: N/A;   LEG SURGERY  1980   right leg    SKIN BIOPSY  08/22/11   shave biopsy =benign   TONSILLECTOMY       Social History:   reports that he quit smoking about 16 years ago. His smoking use included cigarettes. He has a 40.00 pack-year smoking history. He has never used smokeless tobacco. He reports current alcohol use. He reports that he does not use drugs.   Family History:  His family history includes Arthritis in his mother; Diabetes in his brother and father. There is no history of Coronary artery disease, Colon cancer, Esophageal cancer, Rectal cancer, or Stomach cancer.   Allergies No Known Allergies   Home Medications  Prior to Admission medications   Medication Sig Start Date End Date Taking? Authorizing Provider  acetaminophen (TYLENOL) 500 MG tablet Take 500-1,000 mg by mouth daily as needed for headache or mild pain.   Yes [provider]  aspirin 325 MG EC tablet Take 325 mg by mouth daily. 05/10/12  Yes [provider]  Cholecalciferol (VITAMIN D-3 PO) Take 1 capsule by mouth daily.   Yes [provider]  EPINEPHrine 0.3 mg/0.3 mL IJ SOAJ injection Inject 0.3 mg into the muscle as  needed for anaphylaxis. 08/22/21  Yes White, Adrienne R, NP  fenofibrate micronized (LOFIBRA) 134 MG capsule TAKE 1 CAPSULE BY MOUTH DAILY BEFORE AND BREAKFAST Patient taking differently: Take 134 mg by mouth daily before breakfast. 02/08/21  Yes Plotnikov, Evie Lacks, MD  metFORMIN (GLUCOPHAGE) 1000 MG tablet TAKE 1 TABLET(1000 MG) BY MOUTH TWICE DAILY WITH A MEAL Patient taking differently: Take 1,000 mg by mouth 2 (two) times daily with a meal. 08/04/21  Yes Plotnikov, Evie Lacks, MD  Multiple Vitamin (MULTIVITAMIN WITH MINERALS) TABS tablet Take 1 tablet by mouth daily.   Yes  [provider]  nateglinide (STARLIX) 120 MG tablet TAKE 1 TABLET(120 MG) BY MOUTH THREE TIMES DAILY WITH MEALS Patient taking differently: Take 120 mg by mouth 3 (three) times daily with meals. 08/10/20  Yes Plotnikov, Evie Lacks, MD  omeprazole (PRILOSEC) 40 MG capsule TAKE 1 CAPSULE BY MOUTH DAILY Patient taking differently: Take 40 mg by mouth daily. 02/08/21  Yes Plotnikov, Evie Lacks, MD     Critical care time: 40 minutes     CRITICAL CARE Performed by: Cristal Generous   Total critical care time: 40 minutes  Critical care time was exclusive of separately billable procedures and treating other patients.  Critical care was necessary to treat or prevent imminent or life-threatening deterioration.  Critical care was time spent personally by me on the following activities: development of treatment plan with patient and/or surrogate as well as nursing, discussions with consultants, evaluation of patient's response to treatment, examination of patient, obtaining history from patient or surrogate, ordering and performing treatments and interventions, ordering and review of laboratory studies, ordering and review of radiographic studies, pulse oximetry and re-evaluation of patient's condition.  Eliseo Gum MSN, AGACNP-BC [place] for pager 10/22/2021, 2:03 PM

## 2021-10-22 NOTE — Anesthesia Procedure Notes (Signed)
Central Venous Catheter Insertion Performed by: Santa Lighter, MD, anesthesiologist Start/End9/22/2023 7:00 AM, 10/22/2021 7:10 AM Patient location: Pre-op. Preanesthetic checklist: patient identified, IV checked, site marked, risks and benefits discussed, surgical consent, monitors and equipment checked, pre-op evaluation, timeout performed and anesthesia consent Position: Trendelenburg Lidocaine 1% used for infiltration and patient sedated Hand hygiene performed , maximum sterile barriers used  and Seldinger technique used Catheter size: 9 Fr Central line was placed.MAC introducer Procedure performed using ultrasound guided technique. Ultrasound Notes:anatomy identified, needle tip was noted to be adjacent to the nerve/plexus identified, no ultrasound evidence of intravascular and/or intraneural injection and image(s) printed for medical record Attempts: 1 Following insertion, line sutured, dressing applied and Biopatch. Post procedure assessment: free fluid flow, blood return through all ports and no air  Patient tolerated the procedure well with no immediate complications.

## 2021-10-22 NOTE — Anesthesia Procedure Notes (Signed)
Arterial Line Insertion Start/End9/22/2023 7:05 AM, 10/22/2021 7:05 AM Performed by: Santa Lighter, MD, anesthesiologist  Patient location: Pre-op. Preanesthetic checklist: patient identified, IV checked, site marked, risks and benefits discussed, surgical consent, monitors and equipment checked, pre-op evaluation, timeout performed and anesthesia consent Lidocaine 1% used for infiltration and patient sedated Left, radial was placed Catheter size: 20 G Hand hygiene performed  and maximum sterile barriers used   Attempts: 1 Procedure performed using ultrasound guided technique. Ultrasound Notes:anatomy identified, needle tip was noted to be adjacent to the nerve/plexus identified and no ultrasound evidence of intravascular and/or intraneural injection Following insertion, dressing applied and Biopatch. Post procedure assessment: normal  Patient tolerated the procedure well with no immediate complications.

## 2021-10-22 NOTE — Transfer of Care (Signed)
Immediate Anesthesia Transfer of Care Note  Patient: Jon [name]  Procedure(s) Performed: CORONARY ARTERY BYPASS GRAFTING (CABG) X THREE, USING LEFT INTERNAL MAMMARY ARTERY AND RIGHT LEG GREATER SAPHENOUS VEIN HARVESTED ENDOSCOPICALLY (Chest) MAZE TRANSESOPHAGEAL ECHOCARDIOGRAM (TEE) MITRAL VALVE REPAIR (MVR) USING MCCARTHY-ADAMS RING SIZE 30MM (Chest)  Patient Location: SICU  Anesthesia Type:General  Level of Consciousness: Patient remains intubated per anesthesia plan  Airway & Oxygen Therapy: Patient remains intubated per anesthesia plan and Patient placed on Ventilator (see vital sign flow sheet for setting)  Post-op Assessment: Report given to RN and Post -op Vital signs reviewed and stable  Post vital signs: Reviewed and stable  Last Vitals:  Vitals Value Taken Time  BP 112/79 10/22/21 1326  Temp 35.9 C 10/22/21 1332  Pulse 79 10/22/21 1332  Resp 17 10/22/21 1332  SpO2 100 % 10/22/21 1332  Vitals shown include unvalidated device data.  Last Pain:  Vitals:   10/22/21 0406  TempSrc: Axillary  PainSc: Asleep      Patients Stated Pain Goal: 0 (15/05/69 7948)  Complications: No notable events documented.

## 2021-10-22 NOTE — Anesthesia Procedure Notes (Signed)
Central Venous Catheter Insertion Performed by: Santa Lighter, MD, anesthesiologist Start/End9/22/2023 7:10 AM, 10/22/2021 7:15 AM Patient location: Pre-op. Preanesthetic checklist: patient identified, IV checked, site marked, risks and benefits discussed, surgical consent, monitors and equipment checked, pre-op evaluation and timeout performed Position: Trendelenburg Hand hygiene performed  and maximum sterile barriers used  Total catheter length 100. PA cath was placed.Swan type:thermodilution PA Cath depth:55 Procedure performed without using ultrasound guided technique. Attempts: 1 Patient tolerated the procedure well with no immediate complications.

## 2021-10-22 NOTE — Anesthesia Procedure Notes (Signed)
Procedure Name: Intubation Date/Time: 10/22/2021 7:48 AM  Performed by: Lorie Phenix, CRNAPre-anesthesia Checklist: Patient identified, Emergency Drugs available, Suction available and Patient being monitored Patient Re-evaluated:Patient Re-evaluated prior to induction Oxygen Delivery Method: Circle system utilized Preoxygenation: Pre-oxygenation with 100% oxygen Induction Type: IV induction Ventilation: Mask ventilation without difficulty Laryngoscope Size: Mac and 4 Grade View: Grade I Tube type: Oral Tube size: 8.0 mm Number of attempts: 1 Airway Equipment and Method: Stylet Placement Confirmation: ETT inserted through vocal cords under direct vision, positive ETCO2 and breath sounds checked- equal and bilateral Secured at: 24 cm Tube secured with: Tape Dental Injury: Teeth and Oropharynx as per pre-operative assessment

## 2021-10-22 NOTE — Progress Notes (Signed)
Patient is on PSV at this time. Minute ventilation is acceptable. No complications noted.

## 2021-10-22 NOTE — Progress Notes (Signed)
Patient ID: Jon [name], male   DOB: 08-28-1945, 76 y.o.   MRN: [id number]  TCTS Evening Rounds:   Hemodynamically stable  CI = 1.8 on epi 3  EF normal  Still on vent.  Urine output good  CT output low  CBC    Component Value Date/Time   WBC 14.5 (H) 10/22/2021 1336   RBC 4.06 (L) 10/22/2021 1336   HGB 11.9 (L) 10/22/2021 1343   HGB 15.8 03/25/2014 1049   HCT 35.0 (L) 10/22/2021 1343   HCT 48.6 03/25/2014 1049   PLT 249 10/22/2021 1336   PLT 332 03/25/2014 1049   MCV 86.5 10/22/2021 1336   MCV 85.9 03/25/2014 1049   MCH 29.6 10/22/2021 1336   MCHC 34.2 10/22/2021 1336   RDW 14.8 10/22/2021 1336   RDW 15.0 (H) 03/25/2014 1049   LYMPHSABS 3.8 10/13/2021 0510   LYMPHSABS 2.0 03/25/2014 1049   MONOABS 1.0 10/13/2021 0510   MONOABS 0.8 03/25/2014 1049   EOSABS 0.4 10/13/2021 0510   EOSABS 0.2 03/25/2014 1049   BASOSABS 0.1 10/13/2021 0510   BASOSABS 0.0 03/25/2014 1049     BMET    Component Value Date/Time   NA 140 10/22/2021 1343   K 3.4 (L) 10/22/2021 1343   CL 102 10/22/2021 1225   CO2 27 10/21/2021 1207   GLUCOSE 125 (H) 10/22/2021 1225   BUN 13 10/22/2021 1225   CREATININE 0.90 10/22/2021 1225   CREATININE 1.20 02/15/2011 0846   CALCIUM 9.8 10/21/2021 1207   GFRNONAA 52 (L) 10/21/2021 1207     A/P:  Stable postop course. Continue current plans

## 2021-10-22 NOTE — Progress Notes (Signed)
Weaning initiated at this time. Tolerating it well.

## 2021-10-22 NOTE — Discharge Summary (Signed)
[address]       [place] [address]             [phone]    Physician Discharge Summary  Patient ID: Jon [name] MRN: [id number] DOB/AGE: [birth year] 76 y.o.  Admit date: 10/13/2021 Discharge date: 11/02/2021  Admission Diagnoses:  Patient Active Problem List   Diagnosis Date Noted   Frequent PVCs    Inferior ST segment elevation 10/14/2021   New onset a-fib ([place]) 10/13/2021   Near syncope 10/13/2021   Syncope 10/13/2021   Non-ST elevation (NSTEMI) myocardial infarction [name])    Low sodium levels 09/10/2021   Rash 09/02/2021   Obesity (BMI 30.0-34.9) 02/24/2020   Callus of heel 06/10/2019   Abnormal urine color 11/13/2018   Leg edema, right 10/30/2018   Coronary artery disease 03/28/2018   Paresthesia 03/28/2018   Polycythemia 12/21/2015   Erectile dysfunction 06/18/2013   Piriformis syndrome of right side 01/08/2013   Diabetes mellitus, type 2 ([place]) 02/16/2012   Shoulder pain, right 10/17/2011   Well adult exam 07/13/2011   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[place])    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[place])    Status post chemotherapy    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   Dysphagia 07/09/2010   Hypertension 07/09/2010   Neoplasm of uncertain behavior of skin 12/31/2009   CERUMEN IMPACTION 12/31/2009   HYPERGLYCEMIA 12/31/2009   VERTIGO 83/15/1761   HELICOBACTER PYLORI INFECTION 06/30/2009   GERD 06/16/2009   CHEST PAIN 06/16/2009   OSA on CPAP 10/31/2008   POLYP, COLON 10/31/2007   Dyslipidemia 10/31/2007   PAF (paroxysmal atrial fibrillation) ([place]) 10/31/2007     Discharge Diagnoses:  Patient Active Problem List   Diagnosis Date Noted   AKI (acute kidney injury) ([place])    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   S/P MVR (mitral valve repair) 10/22/2021   S/P Maze operation for atrial fibrillation    ABLA (acute blood loss anemia)    Frequent PVCs    Inferior ST segment elevation 10/14/2021   New onset a-fib ([place]) 10/13/2021   Near syncope  10/13/2021   Syncope 10/13/2021   Non-ST elevation (NSTEMI) myocardial infarction ([place])    Low sodium levels 09/10/2021   Rash 09/02/2021   Obesity (BMI 30.0-34.9) 02/24/2020   Callus of heel 06/10/2019   Abnormal urine color 11/13/2018   Leg edema, right 10/30/2018   Coronary artery disease 03/28/2018   Paresthesia 03/28/2018   Polycythemia 12/21/2015   Erectile dysfunction 06/18/2013   Piriformis syndrome of right side 01/08/2013   Diabetes mellitus, type 2 ([place]) 02/16/2012   Shoulder pain, right 10/17/2011   Well adult exam 07/13/2011   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[place])    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[place])    Status post chemotherapy    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   Dysphagia 07/09/2010   Hypertension 07/09/2010   Neoplasm of uncertain behavior of skin 12/31/2009   CERUMEN IMPACTION 12/31/2009   HYPERGLYCEMIA 12/31/2009   VERTIGO 60/73/7106   HELICOBACTER PYLORI INFECTION 06/30/2009   GERD 06/16/2009   CHEST PAIN 06/16/2009   OSA on CPAP 10/31/2008   POLYP, COLON 10/31/2007   Dyslipidemia 10/31/2007   PAF (paroxysmal atrial fibrillation) ([place]) 10/31/2007    Discharged Condition: good  History of Present Illness:     Jon [name] is a 76 year old male with a past medical history of hypertension, diabetes mellitus (type II), OSA on CPAP, tobacco abuse, newly diagnosed paroxysmal atrial fibrillation who presented to [name] on  10/13/2021 with complaints of his chest burning and feeling "dry". Of note, he was initially going to leave the ED because "he had things that had to be done";however, he became pale, diaphoretic, had lightheadedness, and an apparent syncopal episode. and decided he should stay. Initial Troponin I (high sensitivity) was 11 and peaked at 15,166. EKG showed a flutter with  minimal ST elevation in inferior lead and ST depression in V1-V3. He ruled in for a STEMI. Echo done 10/13/2021 showed LVEF 60-65%, mild MR, severe calcification of  the aortic valve with some aortic stenosis. Cardiac catheterization done 10/13/2021 showed severe proximal and mid LAD stenosis with tandem calcific lesions and total occlusion of right posterolateral branch with thrombus. Another echo was done today and showed mild AS, peak gradient 14.3 mmHg. Interventional cardiology treated the proximal LAD lesion with balloon angioplasty but the leston could not be crossed for atherectomy. Cardiothoracic consultation was requested with Dr. Lavonna [name] for the consideration of coronary artery bypass grafting surgery.    Patient lives alone and is fairly active (walks a few miles daily). At the time of my exam, patient had no complaint of chest pain, pressure or shortness of breath.   Dr. Lavonna [name] reviewed the patient's chart, labs and diagnostic studies and determined that coronary artery bypass grafting would provide the best long term treatment for this patient. He discussed his treatment options as well as the risks and benefits of surgery with the patient. After careful consideration [name] agreed to move forward with surgical revascularization.  Hospital Course: [name] was an inpatient at [name] and was brought to the operating room on 10/22/2021. During intraoperative TEE it was determined his mitral valve regurgitation required repair. He underwent a CABG x 3 utilizing SVG to PDA sequentially to PLA, LIMA to LAD, he also underwent a Maze procedure and a mitral valve repair utilizing a 71m Mccarthy-Adams ring. He tolerated the procedure well and was transferred to the SICU in stable condition.  He required neo-synephrine for blood pressure support overnight.  He require Epi and Levophed to help with hemodynamics.  All drips were weaned as tolerated.  He was extubated without difficulty.  He developed AKI likely due to the contrast, creatinine was 1.37. He was started on Midodrine to help wean pressors.  He developed worsening respiratory  status and was placed on BIPAP. He required temporary pacing with underlying junctional rhythm in the 50s and therefore he was not started on a BB at this time. He was volume overloaded and started on Demadex. Creatinine rose to 1.49 so diuretics were held. His chest tubes were removed without difficulty. The patient continued to require support for BP.  Echocardiogram was obtained and showed normal EF.  His MV Repair showed trivial mitral regurgitation.  He was started on Vasopressin for additional support.   He was started on coumadin for his MV Repair.  He developed Atrial Fibrillation overnight.  Due to persistent arrhythmias, EP consult was obtained.  They did not feel PPM placement was indicated.  The patient was found to be in NSR with some mild intraventricular delay.  The patient had issues with hypoglycemia.  His long acting insulin was discontinued and sliding scale coverage was adjusted.  The patient was stable for transfer to the progressive care unit on 10/28/2021.  He continues to have some PACs and PVCs with some A-fib/flutter.  EP continues to hope to avoid nodal blocking agents for a couple months.  On postop day 7 INR is  noted to be 2.3 and Coumadin was held with hopes to remove epicardial pacing wires the following day. Cardiology was re-consulted due to atrial fibrillation with RVR, they started amiodarone. He converted to sinus rhythm on 10/01. AV wires were removed on 10/31/2021 and Coumadin was resumed. His INR was 1.7 on 2.7m of coumadin. He was fluid overloaded and chest x-ray showed a small left pleural effusion so 1 dose of PO lasix was given. He tolerated the lasix well. Creatinine was stable at 1.28. He was discharged home on 2.553mof Coumadin with an INR 1.7. PT/OT was consulted and they recommended home health PT/OT for assistance with ADLs. He was felt medically stable for discharge home with home health assistance.   Consults: cardiology  Significant Diagnostic Studies:    CORONARY BALLOON ANGIOPLASTY  LEFT HEART CATH AND CORONARY ANGIOGRAPHY   Left Anterior Descending  Prox LAD to Mid LAD lesion is 90% stenosed. The lesion is severely calcified.  Mid LAD lesion is 80% stenosed. The lesion is calcified.    Right Coronary Artery  There is mild diffuse disease throughout the vessel. The RCA is ectatic, calcified, and tortuous throughout with mild to moderate diffuse plaquing  Prox RCA lesion is 50% stenosed.    Right Posterior Descending Artery  RPDA lesion is 60% stenosed.    Right Posterior Atrioventricular Artery  RPAV lesion is 95% stenosed. The lesion is thrombotic. The lesion was previously treated .       ECHOCARDIOGRAM REPORT         Patient Name:   HeEZREAL TURAYate of Exam: 10/13/2021  Medical Rec #:  [id number]        Height:       70.0 in  Accession #:    [id number]       Weight:       190.0 lb  Date of Birth:  5/05-16-1947         BSA:          2.042 m  Patient Age:    89ears           BP:           128/76 mmHg  Patient Gender: M                  HR:           58 bpm.  Exam Location:  Inpatient   Procedure: 2D Echo, Cardiac Doppler and Color Doppler   Indications:    Syncope     History:        Patient has no prior history of Echocardiogram  examinations.                  Arrythmias:Atrial Fibrillation, Signs/Symptoms:Murmur;  Risk                  Factors:Diabetes and Hypertension.     Sonographer:    ElMemory ArgueReferring Phys: PILequita Halt IMPRESSIONS     1. Left ventricular ejection fraction, by estimation, is 60 to 65%. The  left ventricle has normal function. The left ventricle demonstrates  regional wall motion abnormalities with basal inferior and basal  inferolateral severe hypokinesis. There is mild  concentric left ventricular hypertrophy. Left ventricular diastolic  parameters are consistent with Grade II diastolic dysfunction  (pseudonormalization).   2. Right ventricular systolic function  is normal. The right ventricular  size is normal. Tricuspid regurgitation  signal is inadequate for assessing  PA pressure.   3. The mitral valve is normal in structure. Mild mitral valve  regurgitation. No evidence of mitral stenosis.   4. The aortic valve is tricuspid. There is severe calcification of the  aortic valve. Aortic valve regurgitation is not visualized. There appears  to be some degree of aortic stenosis but the aortic valve was not fully  interrogated by doppler. Would  consider repeat limited echo with attention to aortic valve doppler  interrogation.   5. The inferior vena cava is normal in size with greater than 50%  respiratory variability, suggesting right atrial pressure of 3 mmHg.   FINDINGS   Left Ventricle: Left ventricular ejection fraction, by estimation, is 60  to 65%. The left ventricle has normal function. The left ventricle  demonstrates regional wall motion abnormalities. The left ventricular  internal cavity size was normal in size.  There is mild concentric left ventricular hypertrophy. Left ventricular  diastolic parameters are consistent with Grade II diastolic dysfunction  (pseudonormalization).   Right Ventricle: The right ventricular size is normal. No increase in  right ventricular wall thickness. Right ventricular systolic function is  normal. Tricuspid regurgitation signal is inadequate for assessing PA  pressure.   Left Atrium: Left atrial size was normal in size.   Right Atrium: Right atrial size was normal in size.   Pericardium: There is no evidence of pericardial effusion.   Mitral Valve: The mitral valve is normal in structure. There is mild  calcification of the mitral valve leaflet(s). Mild mitral annular  calcification. Mild mitral valve regurgitation. No evidence of mitral  valve stenosis.   Tricuspid Valve: The tricuspid valve is normal in structure. Tricuspid  valve regurgitation is not demonstrated.   Aortic Valve: The  aortic valve is tricuspid. There is severe calcifcation  of the aortic valve. Aortic valve regurgitation is not visualized. Aortic  valve mean gradient measures 3.0 mmHg. Aortic valve peak gradient measures  6.4 mmHg. Aortic valve area, by   VTI measures 1.81 cm.   Pulmonic Valve: The pulmonic valve was normal in structure. Pulmonic valve  regurgitation is not visualized.   Aorta: The aortic root is normal in size and structure.   Venous: The inferior vena cava is normal in size with greater than 50%  respiratory variability, suggesting right atrial pressure of 3 mmHg.   IAS/Shunts: No atrial level shunt detected by color flow Doppler.      LEFT VENTRICLE  PLAX 2D  LVIDd:         4.10 cm   Diastology  LVIDs:         2.80 cm   LV e' medial:    5.44 cm/s  LV PW:         1.20 cm   LV E/e' medial:  16.0  LV IVS:        1.30 cm   LV e' lateral:   10.00 cm/s  LVOT diam:     2.00 cm   LV E/e' lateral: 8.7  LV SV:         48  LV SV Index:   24  LVOT Area:     3.14 cm      RIGHT VENTRICLE  TAPSE (M-mode): 1.7 cm   LEFT ATRIUM             Index        RIGHT ATRIUM           Index  LA diam:  3.20 cm 1.57 cm/m   RA Area:     15.30 cm  LA Vol (A2C):   59.1 ml 28.94 ml/m  RA Volume:   35.40 ml  17.33 ml/m  LA Vol (A4C):   50.1 ml 24.53 ml/m  LA Biplane Vol: 54.6 ml 26.73 ml/m   AORTIC VALVE  AV Area (Vmax):    1.73 cm  AV Area (Vmean):   1.67 cm  AV Area (VTI):     1.81 cm  AV Vmax:           126.00 cm/s  AV Vmean:          82.500 cm/s  AV VTI:            0.266 m  AV Peak Grad:      6.4 mmHg  AV Mean Grad:      3.0 mmHg  LVOT Vmax:         69.30 cm/s  LVOT Vmean:        43.900 cm/s  LVOT VTI:          0.153 m  LVOT/AV VTI ratio: 0.58     AORTA  Ao Root diam: 3.50 cm   MITRAL VALVE  MV Area (PHT): 3.60 cm    SHUNTS  MV Decel Time: 211 msec    Systemic VTI:  0.15 m  MR Peak grad: 86.9 mmHg    Systemic Diam: 2.00 cm  MR Vmax:      466.00 cm/s  MV E  velocity: 87.00 cm/s  MV A velocity: 78.50 cm/s  MV E/A ratio:  1.11   Dalton McleanMD  Electronically signed by Franki Monte  Signature Date/Time: 10/13/2021/3:25:43 PM         Final    ECHOCARDIOGRAM LIMITED REPORT         Patient Name:   WILLY PINKERTON Date of Exam: 10/15/2021  Medical Rec #:  [id number]          Height:       70.0 in  Accession #:    [id number]         Weight:       191.6 lb  Date of Birth:  02/23/1945           BSA:          2.050 m  Patient Age:    81 years           BP:           108/77 mmHg  Patient Gender: M                  HR:           59 bpm.  Exam Location:  Inpatient   Procedure: Limited Echo, Color Doppler and Cardiac Doppler   Indications:    Limited to reassess for Aortic Stenosis i35.0     History:        Patient has prior history of Echocardiogram examinations,  most                  recent 10/13/2021. CAD, Arrythmias:Atrial Fibrillation;  Risk                  Factors:Hypertension, Diabetes, Dyslipidemia and Sleep  Apnea.     Sonographer:    Raquel Sarna Senior RDCS  Referring Phys: [place]     1. The aortic valve is calcified. There is severe calcifcation of the  aortic valve. Aortic valve regurgitation is not visualized. Mild aortic  valve stenosis. Aortic valve area, by VTI measures 1.92 cm. Aortic valve  mean gradient measures 9.0 mmHg.  Aortic valve Vmax measures 1.89 m/s.   Conclusion(s)/Recommendation(s): Limited study to assess aortic valve  stenosis. Stenosis is mild visually, by gradients, by dimensionless index,  and by valve area.   FINDINGS   Aortic Valve: The aortic valve is calcified. There is severe calcifcation  of the aortic valve. Aortic valve regurgitation is not visualized. Mild  aortic stenosis is present. Aortic valve mean gradient measures 9.0 mmHg.  Aortic valve peak gradient  measures 14.3 mmHg. Aortic valve area, by VTI measures 1.92 cm.   LEFT VENTRICLE  PLAX 2D   LVOT diam:     2.00 cm  LV SV:         77  LV SV Index:   38  LVOT Area:     3.14 cm      AORTIC VALVE  AV Area (Vmax):    1.83 cm  AV Area (Vmean):   1.95 cm  AV Area (VTI):     1.92 cm  AV Vmax:           189.00 cm/s  AV Vmean:          140.000 cm/s  AV VTI:            0.403 m  AV Peak Grad:      14.3 mmHg  AV Mean Grad:      9.0 mmHg  LVOT Vmax:         110.00 cm/s  LVOT Vmean:        86.700 cm/s  LVOT VTI:          0.246 m  LVOT/AV VTI ratio: 0.61      SHUNTS  Systemic VTI:  0.25 m  Systemic Diam: 2.00 cm   Buford Dresser MD  Electronically signed by Buford Dresser MD  Signature Date/Time: 10/15/2021/12:17:48 PM         Final    Treatments: Surgery  10/22/2021 Patient:  [name] Pre-Op Dx: Coronary artery disease sp STEMI with Atrial fibrillation    Post-op Dx:  Same plus severe ischemic mitral valve regurgitation Procedure: CABG X 3 with the LIMA to the LAD and RSVG from the aorta to the PDA sequenced to the PLAT Radiofrequency Pulmonary vein isolation  maze ablation  Mitral valve repair with an Edwards 30 mm Carpentier-Adams ischemic ring annuloplasty  Closure of LA appedage    Endoscopic greater saphenous vein harvest on the right leg     Surgeon and Role:      Coralie Common, MD- Primary    * Wynelle Beckmann , [place], PA-C assisting An experienced assistant was required given the complexity of this surgery and the standard of surgical care. The assistant was needed for exposure, dissection, suctioning, retraction of delicate tissues and sutures, instrument exchange and for overall help during this procedure.       Discharge Exam: Blood pressure 93/67, pulse 63, temperature 98.1 F (36.7 C), temperature source Oral, resp. rate 20, height 5' 10" (1.778 m), weight 93.5 kg, SpO2 96 %. General appearance: alert, cooperative, and no distress Neurologic: intact Heart: regular rate and rhythm,  S1, S2 normal, no murmur, click, rub or gallop Lungs: Diminished BS left basilar, otherwise clear to auscultation Abdomen: soft, non-tender; bowel sounds normal; no masses,  no organomegaly  Extremities: Mildly edematous, no erythema Wound: Clean, dry, intact   Discharge Medications:  The patient has been discharged on:   1.Beta Blocker:  Yes [   ]                              No   [  X ]                              If No, reason: bradycardia/hypotension  2.Ace Inhibitor/ARB: Yes [   ]                                     No  [   X ]                                     If No, reason: hypotension  3.Statin:   Yes [x   ]                  No  [   ]                  If No, reason:  4.Ecasa:  Yes  [ x  ]                  No   [   ]                  If No, reason:  Patient had ACS upon admission: Yes  Plavix/P2Y12 inhibitor: Yes [   ]                                      No  [ X  ] ON COUMADIN      Discharge Instructions     Amb Referral to Cardiac Rehabilitation   Complete by: As directed    Diagnosis: CABG   CABG X ___: 3   After initial evaluation and assessments completed: Virtual Based Care may be provided alone or in conjunction with Phase 2 Cardiac Rehab based on patient barriers.: Yes   Intensive Cardiac Rehabilitation (ICR) [place] location only OR Traditional Cardiac Rehabilitation (TCR) *If criteria for ICR are not met will enroll in TCR [facility] only): Yes      Allergies as of 11/02/2021   No Known Allergies      Medication List     TAKE these medications    acetaminophen 500 MG tablet Commonly known as: TYLENOL Take 500-1,000 mg by mouth daily as needed for headache or mild pain.   amiodarone 200 MG tablet Commonly known as: PACERONE Take 257m twice per day for 7 days then take 2041monce per day thereafter   aspirin EC 81 MG tablet Take 1 tablet (81 mg total) by mouth daily. Swallow whole. What changed:  medication strength how much to  take additional instructions   atorvastatin 80 MG tablet Commonly known as: LIPITOR Take 1 tablet (80 mg total) by mouth daily.   EPINEPHrine 0.3 mg/0.3 mL Soaj injection Commonly known as: EPI-PEN Inject 0.3 mg into the muscle as needed for anaphylaxis.   fenofibrate micronized 134 MG capsule Commonly  known as: LOFIBRA TAKE 1 CAPSULE BY MOUTH DAILY BEFORE AND BREAKFAST What changed:  how much to take how to take this when to take this additional instructions   metFORMIN 1000 MG tablet Commonly known as: GLUCOPHAGE TAKE 1 TABLET(1000 MG) BY MOUTH TWICE DAILY WITH A MEAL What changed: See the new instructions.   midodrine 5 MG tablet Commonly known as: PROAMATINE Take 3 tablets (15 mg total) by mouth 3 (three) times daily with meals.   multivitamin with minerals Tabs tablet Take 1 tablet by mouth daily.   nateglinide 120 MG tablet Commonly known as: STARLIX TAKE 1 TABLET(120 MG) BY MOUTH THREE TIMES DAILY WITH MEALS What changed: See the new instructions.   omeprazole 40 MG capsule Commonly known as: PRILOSEC TAKE 1 CAPSULE BY MOUTH DAILY   oxyCODONE 5 MG immediate release tablet Commonly known as: Oxy IR/ROXICODONE Take 1 tablet (5 mg total) by mouth every 4 (four) hours as needed for severe pain.   VITAMIN D-3 PO Take 1 capsule by mouth daily.   warfarin 2.5 MG tablet Commonly known as: Coumadin Take 1 tablet (2.5 mg total) by mouth daily.        Follow-up Information     Plotnikov, Evie Lacks, MD. Schedule an appointment as soon as possible for a visit .   Specialty: Internal Medicine Contact information: [place] [address] [phone]         [facility] [place] Follow up on 11/08/2021.   Specialty: Cardiothoracic Surgery Why: To get PA/Lat CXR at 3:00 PM (at [place], on the first floor of Dr[facility] Nicholos [facility]ice building), 30 minutes before your 3:30 appointment Contact  information: [place], [place]. [facility] Follow up on 11/05/2021.   Specialty: Cardiology Why: PT/INR check with coumadin clinic at 2:30PM Contact information: [address] [place] [id number] [place] [address] [phone]        Marylu Lund., NP Follow up on 11/12/2021.   Specialty: Nurse Practitioner Why: Cardiology follow up appointment at 10:30am Contact information: [address] [place] [address] [place] Follow up.   Why: ([phone])- HHPT/OT arranged- they will contact you to schedule home visits for therapy needs                Signed:  Magdalene River, PA-C  11/02/2021, 7:44 AM

## 2021-10-22 NOTE — Op Note (Signed)
[address]       [place] [address]             [phone]                                          10/22/2021 Patient:  Jon [name] Pre-Op Dx: Coronary artery disease sp STEMI with Atrial fibrillation    Post-op Dx:  Same plus severe ischemic mitral valve regurgitation Procedure: CABG X 3 with the LIMA to the LAD and RSVG from the aorta to the PDA sequenced to the PLAT Radiofrequency Pulmonary vein isolation  maze ablation  Mitral valve repair with an Edwards 30 mm Carpentier-Adams ischemic ring annuloplasty  Closure of LA appedage    Endoscopic greater saphenous vein harvest on the right leg   Surgeon and Role:      Coralie Common, MD- Primary    * Wynelle Beckmann , [place], PA-C assisting An experienced assistant was required given the complexity of this surgery and the standard of surgical care. The assistant was needed for exposure, dissection, suctioning, retraction of delicate tissues and sutures, instrument exchange and for overall help during this procedure.    Anesthesia  Hoy Morn, MD  general EBL:  minimal ml Blood Administration: none Xclamp Time:  99 min Pump Time:  176mn  Drains: CT x 3 to the Left pleural space, the anterior and posterior mediastinum Wires: atrial and ventricular Counts: correct   Indications: PT admitted 9/13 with inferior STEMI. Cath with culprit RPLV branch not able to be openned. Had echo with Normal LV function. Had aflutter/afib now in NSR. Attempt at staged PCI of LAD lesion unsuccessful today. Have been asked to provide revascularization Has received Brilinta and ASA.  I feel the pt is a good candidate for CABG with targets being LAD, PDA, PLAT. Will occlude LA appendage and discuss with cardiology of pulm vein isolation maze of need now that in NSR. Was probably secondary to ischemia Findings: The LV had an EF of 55% with now severe MR with posterior directed jet  consistent with ischemic mr with restriction of the posterior leaflet (Type IIIb). The LAD, PDA and PLAT were 1.5 to 2.0 mm vessels with moderate disease. The SVG was slightly enlarged and the LIMA was adequate. At the conclusion of the procedure the pt was in a DDD mode for pacing, the mitral valve had no regurgitation and the mean gradient was 468mg  Operative Technique: Patient was brought to the operating theater and placed on the table in the supine position.  General anesthesia was obtained with use of an endotracheal tube the chest abdomen and legs were prepped and draped in the usual sterile fashion.  A median sternotomy incision was then created and sternal above the sternal saw.  Simultaneously from the right leg the PAs harvested the saphenous vein graft utilizing the endoscopic method and this wound was closed in multiple layers of absorbable suture.  The left internal mammary artery was harvested and packed with heparin soaked sponge.  Heparin was delivered. Pericardial well was developed and the aortic cannulated with a 20 French aortic cannula in the 36 French straight cannula was placed in the right atrial appendage and directed towards the inferior vena cava.  A 28 angled metal tipped cannula was placed in the  superior vena cava for additional venous drainage.  With adequate confirmation of anticoagulation cardiopulmonary bypass was instituted.  The right and left pulmonary vein were then ablated with the AtriCure device for 3 pairs of burns on each set of veins.  Antegrade cardioplegia catheter was placed in the acing aorta and a retrograde cardioplegia catheter placed in the coronary sinus. Aortic cross-clamp was placed and KBC cardioplegia was delivered for 1275 cc antegrade and retrograde total.  An additional 300 cc were delivered retrograde at approximately 1 hour after cross-clamp and a reanimation dose was delivered as part of cross-clamp removal. The posterior descending and a  posterolateral branch of the right coronary artery were then identified and utilizing a single piece of reverse saphenous vein graft in end-to-side anastomosis was constructed to the posterolateral branch utilizing 7-0 Prolene suture and is brought back as a side-to-side anastomosis to the posterior descending coronary artery.  There flushed.  And found to be hemostatic. The mammary artery was then anastomosed to the LAD and the distal aspect utilizing 8-0 Prolene sutures in an end-to-end fashion.  Was flushed de-aired and the pedicle pexed to the anterior surface heart with 5-0 Prolene sutures. The left atrium was then opened and the intra-atrial groove and adequate exposure of the mitral valve was obtained with a mitral retractor.  The left atrial appendage was then oversewn anteriorly with a running linear role for Prolene sutures. 3-0 Ethibond sutures were then placed circle manually on the mitral annulus and the valve was measured to a 30 mm ischemic ring.  The ring was secured with the core knot system.  The valve was tested and was competent.  Photographs were obtained before and after the mitral valve repair. Left atrium was then closed with a running 4-0 Prolene suture over a ventricular sound.  The patient in headdown position aortic cross-clamp was removed and a strong clamp placed on the ascending aorta partially occluding for the anastomosis of the proximal saphenous vein graft.  Proximal vein graft marker was utilized.  Partial clamp was removed and the proximal and distal anastomoses were examined and were hemostatic.  Ventricular atrial pacing wires were placed and brought out through inferior stab was and secured.  Patient was then de-aired and following that the left ventricular sump was removed and the patient was weaned from cardiopulmonary bypass on minimal inotropic support.  With excellent hemodynamics and no mitral rotation protamine was delivered the patient was decannulated and sites  oversewn were necessary.  Chest was brought in for establishment of care.  With adequate hemostasis the sternum was reapproximated with 8 sternal wires and the presternal subcutaneous tissue and skin were closed in multiple nerves observable suture. The patient tolerated the procedure without any immediate complications, and was transferred to the ICU in guarded condition.  Present to 100% of the procedure.  Coralie Common MD

## 2021-10-22 NOTE — Interval H&P Note (Signed)
History and Physical Interval Note:  10/22/2021 6:39 AM  Jon [name]  has presented today for surgery, with the diagnosis of CAD AFIB.  The various methods of treatment have been discussed with the patient and family. After consideration of risks, benefits and other options for treatment, the patient has consented to  Procedure(s): CORONARY ARTERY BYPASS GRAFTING (CABG) (N/A) MAZE (N/A) TRANSESOPHAGEAL ECHOCARDIOGRAM (TEE) (N/A) as a surgical intervention.  The patient's history has been reviewed, patient examined, no change in status, stable for surgery.  I have reviewed the patient's chart and labs.  Questions were answered to the patient's satisfaction.     Coralie Common

## 2021-10-22 NOTE — Discharge Instructions (Addendum)

## 2021-10-22 NOTE — Anesthesia Postprocedure Evaluation (Signed)
Anesthesia Post Note  Patient: Jon [name]  Procedure(s) Performed: CORONARY ARTERY BYPASS GRAFTING (CABG) X THREE, USING LEFT INTERNAL MAMMARY ARTERY AND RIGHT LEG GREATER SAPHENOUS VEIN HARVESTED ENDOSCOPICALLY (Chest) MAZE TRANSESOPHAGEAL ECHOCARDIOGRAM (TEE) MITRAL VALVE REPAIR (MVR) USING MCCARTHY-ADAMS RING SIZE 30MM (Chest)     Patient location during evaluation: SICU Anesthesia Type: General Level of consciousness: sedated Pain management: pain level controlled Vital Signs Assessment: post-procedure vital signs reviewed and stable Respiratory status: patient remains intubated per anesthesia plan Cardiovascular status: stable Postop Assessment: no apparent nausea or vomiting Anesthetic complications: no   No notable events documented.  Last Vitals:  Vitals:   10/22/21 1526 10/22/21 1530  BP: 94/77 92/75  Pulse: 94   Resp: 17 18  Temp:  (!) 35.7 C  SpO2: 100%     Last Pain:  Vitals:   10/22/21 1330  TempSrc: Core  PainSc: 0-No pain                 Santa Lighter

## 2021-10-22 NOTE — Brief Op Note (Signed)
10/13/2021 - 10/22/2021  1:20 PM  PATIENT:  Jon [name]  76 y.o. male  PRE-OPERATIVE DIAGNOSIS:  coronary artery disease atrial fibrillation  POST-OPERATIVE DIAGNOSIS:  coronary artery disease atrial fibrillation  PROCEDURE:  CORONARY ARTERY BYPASS GRAFTING (CABG) X THREE, USING LEFT INTERNAL MAMMARY ARTERY AND RIGHT LEG GREATER SAPHENOUS VEIN HARVESTED ENDOSCOPICALLY  Vein harvest time: 78mn Vein prep time: 168m -SVG to PDA sequentially to PLA -LIMA to LAD MAZE  TRANSESOPHAGEAL ECHOCARDIOGRAM (TEE)  MITRAL VALVE REPAIR (MVR) USING MCCARTHY-ADAMS RING SIZE 30MM    SURGEON:  Surgeon(s) and Role:  WeCoralie CommonMD - Primary  PHYSICIAN ASSISTANT: BaWynelle BeckmannA-C, MyEnid CutterA-C  ASSISTANTS: KiVernie MurdersNFA   ANESTHESIA:   general  EBL:  [id number] ADMINISTERED:none  DRAINS:  mediastinal and pleural chest tubes    LOCAL MEDICATIONS USED:  NONE  SPECIMEN:  No Specimen  DISPOSITION OF SPECIMEN:  N/A  COUNTS CORRECT:  YES  DICTATION: .Dragon Dictation  PLAN OF CARE: Admit to inpatient   PATIENT DISPOSITION:  ICU - intubated and hemodynamically stable.   Delay start of Pharmacological VTE agent (>24hrs) due to surgical blood loss or risk of bleeding: yes

## 2021-10-23 ENCOUNTER — Inpatient Hospital Stay (HOSPITAL_COMMUNITY): Payer: Medicare Other

## 2021-10-23 ENCOUNTER — Other Ambulatory Visit: Payer: Self-pay | Admitting: Internal Medicine

## 2021-10-23 DIAGNOSIS — Z9889 Other specified postprocedural states: Secondary | ICD-10-CM

## 2021-10-23 DIAGNOSIS — R9431 Abnormal electrocardiogram [ECG] [EKG]: Secondary | ICD-10-CM | POA: Diagnosis not present

## 2021-10-23 DIAGNOSIS — D62 Acute posthemorrhagic anemia: Secondary | ICD-10-CM | POA: Diagnosis not present

## 2021-10-23 DIAGNOSIS — I4891 Unspecified atrial fibrillation: Secondary | ICD-10-CM | POA: Diagnosis not present

## 2021-10-23 DIAGNOSIS — N179 Acute kidney failure, unspecified: Secondary | ICD-10-CM | POA: Diagnosis not present

## 2021-10-23 DIAGNOSIS — Z8679 Personal history of other diseases of the circulatory system: Secondary | ICD-10-CM

## 2021-10-23 DIAGNOSIS — Z951 Presence of aortocoronary bypass graft: Secondary | ICD-10-CM

## 2021-10-23 LAB — CBC
HCT: 31 % — ABNORMAL LOW (ref 39.0–52.0)
HCT: 31.2 % — ABNORMAL LOW (ref 39.0–52.0)
Hemoglobin: 10.6 g/dL — ABNORMAL LOW (ref 13.0–17.0)
Hemoglobin: 10.7 g/dL — ABNORMAL LOW (ref 13.0–17.0)
MCH: 29.9 pg (ref 26.0–34.0)
MCH: 30.1 pg (ref 26.0–34.0)
MCHC: 34.2 g/dL (ref 30.0–36.0)
MCHC: 34.3 g/dL (ref 30.0–36.0)
MCV: 87.6 fL (ref 80.0–100.0)
MCV: 87.6 fL (ref 80.0–100.0)
Platelets: 285 10*3/uL (ref 150–400)
Platelets: 273 10*3/uL (ref 150–400)
RBC: 3.54 MIL/uL — ABNORMAL LOW (ref 4.22–5.81)
RBC: 3.56 MIL/uL — ABNORMAL LOW (ref 4.22–5.81)
RDW: 15.2 % (ref 11.5–15.5)
RDW: 15.5 % (ref 11.5–15.5)
WBC: 8.9 10*3/uL (ref 4.0–10.5)
WBC: 12.3 10*3/uL — ABNORMAL HIGH (ref 4.0–10.5)
nRBC: 0 % (ref 0.0–0.2)
nRBC: 0 % (ref 0.0–0.2)

## 2021-10-23 LAB — BASIC METABOLIC PANEL
Anion gap: 10 (ref 5–15)
Anion gap: 11 (ref 5–15)
BUN: 14 mg/dL (ref 8–23)
BUN: 11 mg/dL (ref 8–23)
CO2: 21 mmol/L — ABNORMAL LOW (ref 22–32)
CO2: 19 mmol/L — ABNORMAL LOW (ref 22–32)
Calcium: 7.9 mg/dL — ABNORMAL LOW (ref 8.9–10.3)
Calcium: 7.9 mg/dL — ABNORMAL LOW (ref 8.9–10.3)
Chloride: 107 mmol/L (ref 98–111)
Chloride: 104 mmol/L (ref 98–111)
Creatinine, Ser: 1.37 mg/dL — ABNORMAL HIGH (ref 0.61–1.24)
Creatinine, Ser: 1.34 mg/dL — ABNORMAL HIGH (ref 0.61–1.24)
GFR, Estimated: 53 mL/min — ABNORMAL LOW (ref >60)
GFR, Estimated: 55 mL/min — ABNORMAL LOW (ref >60)
Glucose, Bld: 146 mg/dL — ABNORMAL HIGH (ref 70–99)
Glucose, Bld: 199 mg/dL — ABNORMAL HIGH (ref 70–99)
Potassium: 4.9 mmol/L (ref 3.5–5.1)
Potassium: 5.6 mmol/L — ABNORMAL HIGH (ref 3.5–5.1)
Sodium: 138 mmol/L (ref 135–145)
Sodium: 134 mmol/L — ABNORMAL LOW (ref 135–145)

## 2021-10-23 LAB — MAGNESIUM
Magnesium: 2.4 mg/dL (ref 1.7–2.4)
Magnesium: 2.2 mg/dL (ref 1.7–2.4)

## 2021-10-23 LAB — GLUCOSE, CAPILLARY
Glucose-Capillary: 129 mg/dL — ABNORMAL HIGH (ref 70–99)
Glucose-Capillary: 135 mg/dL — ABNORMAL HIGH (ref 70–99)
Glucose-Capillary: 142 mg/dL — ABNORMAL HIGH (ref 70–99)
Glucose-Capillary: 147 mg/dL — ABNORMAL HIGH (ref 70–99)
Glucose-Capillary: 153 mg/dL — ABNORMAL HIGH (ref 70–99)
Glucose-Capillary: 155 mg/dL — ABNORMAL HIGH (ref 70–99)
Glucose-Capillary: 159 mg/dL — ABNORMAL HIGH (ref 70–99)
Glucose-Capillary: 160 mg/dL — ABNORMAL HIGH (ref 70–99)
Glucose-Capillary: 164 mg/dL — ABNORMAL HIGH (ref 70–99)
Glucose-Capillary: 170 mg/dL — ABNORMAL HIGH (ref 70–99)
Glucose-Capillary: 192 mg/dL — ABNORMAL HIGH (ref 70–99)
Glucose-Capillary: 210 mg/dL — ABNORMAL HIGH (ref 70–99)
Glucose-Capillary: 215 mg/dL — ABNORMAL HIGH (ref 70–99)

## 2021-10-23 MED ORDER — INSULIN DETEMIR 100 UNIT/ML ~~LOC~~ SOLN
20.0000 [IU] | Freq: Two times a day (BID) | SUBCUTANEOUS | Status: DC
  Administered 2021-10-23 – 2021-10-24 (×3): 20 [IU] via SUBCUTANEOUS
  Filled 2021-10-23 (×6): qty 0.2

## 2021-10-23 MED ORDER — INSULIN ASPART 100 UNIT/ML IJ SOLN
0.0000 [IU] | INTRAMUSCULAR | Status: DC
  Administered 2021-10-23: 8 [IU] via SUBCUTANEOUS
  Administered 2021-10-23: 4 [IU] via SUBCUTANEOUS
  Administered 2021-10-23: 8 [IU] via SUBCUTANEOUS
  Administered 2021-10-23: 4 [IU] via SUBCUTANEOUS
  Administered 2021-10-24 (×2): 2 [IU] via SUBCUTANEOUS
  Administered 2021-10-25: 4 [IU] via SUBCUTANEOUS
  Administered 2021-10-26: 2 [IU] via SUBCUTANEOUS
  Administered 2021-10-26: 4 [IU] via SUBCUTANEOUS
  Administered 2021-10-26 – 2021-10-27 (×3): 2 [IU] via SUBCUTANEOUS

## 2021-10-23 MED ORDER — INSULIN ASPART 100 UNIT/ML IJ SOLN: 0.0000 [IU] | INTRAMUSCULAR | Status: DC

## 2021-10-23 MED ORDER — TRAMADOL HCL 50 MG PO TABS: 50.0000 mg | ORAL_TABLET | ORAL | Status: DC | PRN

## 2021-10-23 MED ORDER — FUROSEMIDE 10 MG/ML IJ SOLN
40.0000 mg | Freq: Once | INTRAMUSCULAR | Status: AC
  Administered 2021-10-23: 40 mg via INTRAVENOUS
  Filled 2021-10-23: qty 4

## 2021-10-23 MED ORDER — MIDODRINE HCL 5 MG PO TABS
10.0000 mg | ORAL_TABLET | Freq: Three times a day (TID) | ORAL | Status: DC
  Administered 2021-10-23 – 2021-10-25 (×8): 10 mg via ORAL
  Filled 2021-10-23 (×8): qty 2

## 2021-10-23 MED ORDER — ORAL CARE MOUTH RINSE: 15.0000 mL | OROMUCOSAL | Status: DC | PRN

## 2021-10-23 NOTE — Progress Notes (Signed)
Attempted to place pt on CPAP w/ pressure of 5 and pt could not tolerating and started to spit up. Placed him back on 3L [place]. Vitals stable. RT will continue to monitor.

## 2021-10-23 NOTE — Progress Notes (Signed)
Pt placed on bipap due to RR sustaining 30-38 on CPAP of 10.

## 2021-10-23 NOTE — Progress Notes (Signed)
1 Day Post-Op Procedure(s) (LRB): CORONARY ARTERY BYPASS GRAFTING (CABG) X THREE, USING LEFT INTERNAL MAMMARY ARTERY AND RIGHT LEG GREATER SAPHENOUS VEIN HARVESTED ENDOSCOPICALLY (N/A) MAZE (N/A) TRANSESOPHAGEAL ECHOCARDIOGRAM (TEE) (N/A) MITRAL VALVE REPAIR (MVR) USING MCCARTHY-ADAMS RING SIZE 30MM (N/A) Subjective:  Feels rough but nothing specific.  Has been stable overnight. I started him on NE for BP support because high dose neo not doing it and no reason to increase epi.   Currently on epi 5, NE 10, neo 30.  Rhythm is atrial paced 90.  Junctional under pacer.  Objective: Vital signs in last 24 hours: Temp:  [96.3 F (35.7 C)-100.8 F (38.2 C)] 99.3 F (37.4 C) (09/23 0800) Pulse Rate:  [75-97] 85 (09/23 0800) Cardiac Rhythm: Atrial paced (09/23 0400) Resp:  [12-38] 29 (09/23 0800) BP: (81-162)/(65-135) 91/65 (09/23 0800) SpO2:  [88 %-100 %] 96 % (09/23 0800) Arterial Line BP: (73-149)/(52-92) 97/69 (09/23 0800) FiO2 (%):  [40 %-50 %] 40 % (09/22 2045) Weight:  [93.2 kg] 93.2 kg (09/23 0500)  Hemodynamic parameters for last 24 hours: PAP: (23-46)/(12-29) 46/22 CO:  [2.8 L/min-4.3 L/min] 3.8 L/min CI:  [1.4 L/min/m2-2.1 L/min/m2] 1.9 L/min/m2  Intake/Output from previous day: 09/22 0701 - 09/23 0700 In: 6451.3 [I.V.:4015; HYWVP:710; IV Piggyback:1771.3] Out: 6310 [Urine:5210; Chest Tube:1100] Intake/Output this shift: Total I/O In: 108.3 [I.V.:108.3] Out: 125 [Urine:75; Chest Tube:50]  General appearance: alert and cooperative Neurologic: intact Heart: regular rate and rhythm, S1, S2 normal, no murmur Lungs: clear to auscultation bilaterally Extremities: edema moderate Wound: dressing dry  Lab Results: Recent Labs    10/22/21 1844 10/22/21 2113 10/22/21 2240 10/23/21 0403  WBC 10.6*  --   --  8.9  HGB 10.7*   < > 10.5* 10.6*  HCT 32.8*   < > 31.0* 31.0*  PLT 302  --   --  285   < > = values in this interval not displayed.   BMET:  Recent Labs     10/22/21 1844 10/22/21 2113 10/22/21 2240 10/23/21 0403  NA 141   < > 141 138  K 4.3   < > 4.6 4.9  CL 111  --   --  107  CO2 20*  --   --  21*  GLUCOSE 120*  --   --  146*  BUN 12  --   --  14  CREATININE 1.24  --   --  1.37*  CALCIUM 7.9*  --   --  7.9*   < > = values in this interval not displayed.    PT/INR:  Recent Labs    10/22/21 1336  LABPROT 17.0*  INR 1.4*   ABG    Component Value Date/Time   PHART 7.293 (L) 10/22/2021 2240   HCO3 18.4 (L) 10/22/2021 2240   TCO2 20 (L) 10/22/2021 2240   ACIDBASEDEF 7.0 (H) 10/22/2021 2240   O2SAT 97 10/22/2021 2240   CBG (last 3)  Recent Labs    10/23/21 0527 10/23/21 0643 10/23/21 0806  GLUCAP 159* 160* 155*   CXR: clear  ECG: junctional 61, RBBB. No acute ischemic changes.  Assessment/Plan: S/P Procedure(s) (LRB): CORONARY ARTERY BYPASS GRAFTING (CABG) X THREE, USING LEFT INTERNAL MAMMARY ARTERY AND RIGHT LEG GREATER SAPHENOUS VEIN HARVESTED ENDOSCOPICALLY (N/A) MAZE (N/A) TRANSESOPHAGEAL ECHOCARDIOGRAM (TEE) (N/A) MITRAL VALVE REPAIR (MVR) USING MCCARTHY-ADAMS RING SIZE 30MM (N/A)  POD 1  Requiring vasopressors for BP support. CI ok. EF 55% in OR. Will start po midodrine and wean pressors as tolerated. Hold beta blocker  with junctional rhythm and pressors.  PAF: s/p pulmonary vein isolation ablation. Plan to start Eliquis later.  DM: Preop Hgb A1c was 7.1. Transition to Levemir and SSI.  Volume excess: Wt is 17 lbs over preop. UO good. Will hold on diuresis until pressors decreased some.  Keep chest tubes in since 600 cc out last shift. Thin, bloody.  DC swan  IS, OOB.   LOS: 10 days    Gaye Pollack 10/23/2021

## 2021-10-23 NOTE — Progress Notes (Signed)
NAME:  Jon [name], MRN:  [id number], DOB:  [birth year], LOS: 38 ADMISSION DATE:  10/13/2021, CONSULTATION DATE:  10/22/21 REFERRING MD:  Lavonna Monarch - CVTS, CHIEF COMPLAINT:  s/p  CABG   History of Present Illness:  76 yo M PMH HTN, pAFib not on AC, OSA, hx tobacco use, DM2 presented 9/13  chest pain + near syncope initially leaving the ED AMA but became too dizzy and SOB to elope. Admitted to [facility] with NSTEMI, cards consulted 9/13 and went for cath unsuccessful PCI RPLV. 9/15 unsuccessful PCI LAD. CVTS consult 9/15 for operative evaluation.   Underwent CABG x 3, MAZE, MVR 9/22 and transferred to ICU after case   PCCM is consulted for help with medical management  Pertinent  Medical History  Afib OSA on CPAP Tobacco use HTN HLD  CAD Mitral insufficiency  Significant Hospital Events: Including procedures, antibiotic start and stop dates in addition to other pertinent events   9/13 admit to [facility] w inferior STEMI. Cath lab with incomplete PCI RPLV 9/15 unsuccessful PCI LAD. CVTS consult 9/22 CABG x 3, MAZE, MVR   Interim History / Subjective:  Patient was successfully extubated per protocol  This morning complaining of shortness of breath, requiring CPAP therapy Continue to require IV vasopressor support  Objective   Blood pressure 91/65, pulse 85, temperature 99.3 F (37.4 C), resp. rate (!) 29, height '5\' 10"'$  (1.778 m), weight 93.2 kg, SpO2 96 %. PAP: (23-46)/(12-29) 46/22 CO:  [2.8 L/min-4.3 L/min] 3.8 L/min CI:  [1.4 L/min/m2-2.1 L/min/m2] 1.9 L/min/m2  Vent Mode: CPAP;PSV FiO2 (%):  [40 %-50 %] 40 % Set Rate:  [4 bmp-12 bmp] 4 bmp Vt Set:  [580 mL] 580 mL PEEP:  [5 cmH20] 5 cmH20 Pressure Support:  [10 cmH20] 10 cmH20   Intake/Output Summary (Last 24 hours) at 10/23/2021 1212 Last data filed at 10/23/2021 1000 Gross per 24 hour  Intake 4859.62 ml  Output 4305 ml  Net 554.62 ml   Filed Weights   10/21/21 0633 10/22/21 0548 10/23/21 0500  Weight: 85.5 kg 85.3 kg 93.2  kg    Examination: Physical exam: General: Acutely ill-appearing male, lying on the bed HEENT: [place]/AT, eyes anicteric.  moist mucus membranes Neuro: Alert, awake following commands Chest: Lateral basal crackles, no wheezes or rhonchi, chest tube in place with pacer wire Heart: Paced Rhythm, no murmurs or gallops Abdomen: Soft, nontender, nondistended, bowel sounds present Skin: No rash   Resolved Hospital Problem list     Assessment & Plan:  Acute inferior STEMI, CAD s/p CABG x3 Mitral insufficiency s/p MVR pAfib s/p MAZE  Shock -- cardiogenic +/- vasoplegia +/- medication related TCTS is following Continue aspirin and statin Chest tube management per CVTS Remain on vasopressor support, titrate with map goal 65 Started on midodrine Still on epi and phenylephrine  Acute respiratory failure with hypoxia due to pulmonary edema Patient is on CPAP now Continue gentle diuresis Monitor intake and output After few hours try to wean off CPAP Encourage incentive spirometry  Expected post op ABLA in setting of cardiac surgeries above Monitor H&H and transfuse per CVTS recommendations  Dm2, controlled Transition insulin infusion to long-acting and sliding scale With CBG goal 140-180  Best Practice (right click and "Reselect all SmartList Selections" daily)   Diet/type: Consistent carbohydrate diet DVT prophylaxis: not indicated GI prophylaxis: PPI Lines: Central line, Arterial Line, and yes and it is still needed Foley:  Yes, and it is still needed Code Status:  full code Last date of  multidisciplinary goals of care discussion [patient family was updated at bedside on 9/23]  Labs   CBC: Recent Labs  Lab 10/21/21 1207 10/22/21 0613 10/22/21 0813 10/22/21 1118 10/22/21 1131 10/22/21 1336 10/22/21 1343 10/22/21 1824 10/22/21 1844 10/22/21 2113 10/22/21 2240 10/23/21 0403  WBC 8.0 10.0  --   --   --  14.5*  --   --  10.6*  --   --  8.9  HGB 14.9 15.3   < > 10.3*    < > 12.0*   < > 10.5* 10.7* 10.2* 10.5* 10.6*  HCT 44.4 44.3   < > 29.0*   < > 35.1*   < > 31.0* 32.8* 30.0* 31.0* 31.0*  MCV 87.4 86.2  --   --   --  86.5  --   --  88.2  --   --  87.6  PLT 486* 526*  --  294  --  249  --   --  302  --   --  285   < > = values in this interval not displayed.    Basic Metabolic Panel: Recent Labs  Lab 10/17/21 0755 10/21/21 1207 10/22/21 0813 10/22/21 1012 10/22/21 1037 10/22/21 1104 10/22/21 1131 10/22/21 1225 10/22/21 1343 10/22/21 1824 10/22/21 1844 10/22/21 2113 10/22/21 2240 10/23/21 0403  NA 136 138   < > 135   < > 138   < > 138   < > 141 141 140 141 138  K 3.8 4.0   < > 4.5   < > 3.6   < > 3.9   < > 4.2 4.3 5.0 4.6 4.9  CL 105 105   < > 102  --  101  --  102  --   --  111  --   --  107  CO2 23 27  --   --   --   --   --   --   --   --  20*  --   --  21*  GLUCOSE 181* 109*   < > 145*  --  135*  --  125*  --   --  120*  --   --  146*  BUN 12 16   < > 13  --  13  --  13  --   --  12  --   --  14  CREATININE 1.41* 1.41*   < > 0.90  --  0.80  --  0.90  --   --  1.24  --   --  1.37*  CALCIUM 9.2 9.8  --   --   --   --   --   --   --   --  7.9*  --   --  7.9*  MG 1.9  --   --   --   --   --   --   --   --   --  3.0*  --   --  2.4  PHOS 3.3  --   --   --   --   --   --   --   --   --   --   --   --   --    < > = values in this interval not displayed.   GFR: Estimated Creatinine Clearance: 52.6 mL/min (A) (by C-G formula based on SCr of 1.37 mg/dL (H)). Recent Labs  Lab 10/22/21 0613 10/22/21 1336 10/22/21 1844 10/23/21 0403  WBC  10.0 14.5* 10.6* 8.9    Liver Function Tests: Recent Labs  Lab 10/17/21 0755 10/21/21 1207  AST  --  30  ALT  --  29  ALKPHOS  --  49  BILITOT  --  0.8  PROT  --  7.3  ALBUMIN 3.6 4.0   No results for input(s): "LIPASE", "AMYLASE" in the last 168 hours. No results for input(s): "AMMONIA" in the last 168 hours.  ABG    Component Value Date/Time   PHART 7.293 (L) 10/22/2021 2240   PCO2ART 38.4  10/22/2021 2240   PO2ART 102 10/22/2021 2240   HCO3 18.4 (L) 10/22/2021 2240   TCO2 20 (L) 10/22/2021 2240   ACIDBASEDEF 7.0 (H) 10/22/2021 2240   O2SAT 97 10/22/2021 2240     Coagulation Profile: Recent Labs  Lab 10/22/21 1336  INR 1.4*    Cardiac Enzymes: No results for input(s): "CKTOTAL", "CKMB", "CKMBINDEX", "TROPONINI" in the last 168 hours.  HbA1C: Hgb A1c MFr Bld  Date/Time Value Ref Range Status  09/02/2021 09:54 AM 7.1 (H) 4.6 - 6.5 % Final    Comment:    Glycemic Control Guidelines for People with Diabetes:Non Diabetic:  <6%Goal of Therapy: <7%Additional Action Suggested:  >8%   05/12/2021 09:02 AM 6.8 (H) 4.6 - 6.5 % Final    Comment:    Glycemic Control Guidelines for People with Diabetes:Non Diabetic:  <6%Goal of Therapy: <7%Additional Action Suggested:  >8%     CBG: Recent Labs  Lab 10/23/21 0527 10/23/21 0643 10/23/21 0806 10/23/21 1015 10/23/21 1146  GLUCAP 159* 160* 155* 164* 170*    Critical care time:      Total critical care time: 36 minutes  Performed by: Brogan care time was exclusive of separately billable procedures and treating other patients.   Critical care was necessary to treat or prevent imminent or life-threatening deterioration.   Critical care was time spent personally by me on the following activities: development of treatment plan with patient and/or surrogate as well as nursing, discussions with consultants, evaluation of patient's response to treatment, examination of patient, obtaining history from patient or surrogate, ordering and performing treatments and interventions, ordering and review of laboratory studies, ordering and review of radiographic studies, pulse oximetry and re-evaluation of patient's condition.   Jacky Kindle, MD [facility] See Amion for pager If no response to pager, please call [phone] until 7pm After 7pm, Please call E-link [phone]

## 2021-10-23 NOTE — Progress Notes (Signed)
Will follow.

## 2021-10-23 NOTE — Progress Notes (Signed)
Patient with increased WOB and tachypnea RR 28-40.  Pain has been managed with PO meds.  I along with Dr. Tacy Learn encourage patient to wear CPAP to help decrease his WOB.  Patient resting comfortable with no acute distress.

## 2021-10-23 NOTE — Progress Notes (Signed)
Patient ID: Jon [name], male   DOB: [birth year], 76 y.o.   MRN: [id number]  TCTS  Hemodynamics stable. Weaning NE to 8, epi on 6, neo off.   Increased RR this afternoon and put on bipap.  Sats 98% with RR 31.  UO 75/hr. Will give him some lasix this afternoon.  CT output remains moderate, thin, serosanguinous.

## 2021-10-24 ENCOUNTER — Inpatient Hospital Stay (HOSPITAL_COMMUNITY): Payer: Medicare Other

## 2021-10-24 DIAGNOSIS — D62 Acute posthemorrhagic anemia: Secondary | ICD-10-CM | POA: Diagnosis not present

## 2021-10-24 DIAGNOSIS — N179 Acute kidney failure, unspecified: Secondary | ICD-10-CM | POA: Diagnosis not present

## 2021-10-24 DIAGNOSIS — I4891 Unspecified atrial fibrillation: Secondary | ICD-10-CM | POA: Diagnosis not present

## 2021-10-24 DIAGNOSIS — R9431 Abnormal electrocardiogram [ECG] [EKG]: Secondary | ICD-10-CM | POA: Diagnosis not present

## 2021-10-24 LAB — GLUCOSE, CAPILLARY
Glucose-Capillary: 155 mg/dL — ABNORMAL HIGH (ref 70–99)
Glucose-Capillary: 139 mg/dL — ABNORMAL HIGH (ref 70–99)
Glucose-Capillary: 106 mg/dL — ABNORMAL HIGH (ref 70–99)
Glucose-Capillary: 20 mg/dL — CL (ref 70–99)
Glucose-Capillary: 69 mg/dL — ABNORMAL LOW (ref 70–99)
Glucose-Capillary: 142 mg/dL — ABNORMAL HIGH (ref 70–99)
Glucose-Capillary: 35 mg/dL — CL (ref 70–99)
Glucose-Capillary: 57 mg/dL — ABNORMAL LOW (ref 70–99)
Glucose-Capillary: 65 mg/dL — ABNORMAL LOW (ref 70–99)
Glucose-Capillary: 74 mg/dL (ref 70–99)

## 2021-10-24 LAB — BASIC METABOLIC PANEL
Anion gap: 11 (ref 5–15)
BUN: 11 mg/dL (ref 8–23)
CO2: 21 mmol/L — ABNORMAL LOW (ref 22–32)
Calcium: 8.1 mg/dL — ABNORMAL LOW (ref 8.9–10.3)
Chloride: 104 mmol/L (ref 98–111)
Creatinine, Ser: 1.38 mg/dL — ABNORMAL HIGH (ref 0.61–1.24)
GFR, Estimated: 53 mL/min — ABNORMAL LOW (ref >60)
Glucose, Bld: 163 mg/dL — ABNORMAL HIGH (ref 70–99)
Potassium: 4.4 mmol/L (ref 3.5–5.1)
Sodium: 136 mmol/L (ref 135–145)

## 2021-10-24 LAB — CBC
HCT: 28.8 % — ABNORMAL LOW (ref 39.0–52.0)
Hemoglobin: 9.7 g/dL — ABNORMAL LOW (ref 13.0–17.0)
MCH: 29.2 pg (ref 26.0–34.0)
MCHC: 33.7 g/dL (ref 30.0–36.0)
MCV: 86.7 fL (ref 80.0–100.0)
Platelets: 275 10*3/uL (ref 150–400)
RBC: 3.32 MIL/uL — ABNORMAL LOW (ref 4.22–5.81)
RDW: 15.5 % (ref 11.5–15.5)
WBC: 11.8 10*3/uL — ABNORMAL HIGH (ref 4.0–10.5)
nRBC: 0 % (ref 0.0–0.2)

## 2021-10-24 MED ORDER — TORSEMIDE 20 MG PO TABS
40.0000 mg | ORAL_TABLET | Freq: Every day | ORAL | Status: DC
  Administered 2021-10-24: 40 mg via ORAL
  Filled 2021-10-24: qty 2

## 2021-10-24 MED ORDER — DEXTROSE 50 % IV SOLN
12.5000 g | INTRAVENOUS | Status: AC
  Administered 2021-10-24: 12.5 g via INTRAVENOUS
  Filled 2021-10-24: qty 50

## 2021-10-24 MED ORDER — METOCLOPRAMIDE HCL 5 MG/ML IJ SOLN
10.0000 mg | Freq: Four times a day (QID) | INTRAMUSCULAR | Status: AC
  Administered 2021-10-24 – 2021-10-25 (×4): 10 mg via INTRAVENOUS
  Filled 2021-10-24 (×4): qty 2

## 2021-10-24 NOTE — Progress Notes (Signed)
NAME:  Jon [name], MRN:  [id number], DOB:  [birth year], LOS: 4 ADMISSION DATE:  10/13/2021, CONSULTATION DATE:  10/22/21 REFERRING MD:  Lavonna Monarch - CVTS, CHIEF COMPLAINT:  s/p  CABG   History of Present Illness:  76 yo M PMH HTN, pAFib not on AC, OSA, hx tobacco use, DM2 presented 9/13  chest pain + near syncope initially leaving the ED AMA but became too dizzy and SOB to elope. Admitted to [facility] with NSTEMI, cards consulted 9/13 and went for cath unsuccessful PCI RPLV. 9/15 unsuccessful PCI LAD. CVTS consult 9/15 for operative evaluation.   Underwent CABG x 3, MAZE, MVR 9/22 and transferred to ICU after case   PCCM is consulted for help with medical management  Pertinent  Medical History  Afib OSA on CPAP Tobacco use HTN HLD  CAD Mitral insufficiency  Significant Hospital Events: Including procedures, antibiotic start and stop dates in addition to other pertinent events   9/13 admit to [facility] w inferior STEMI. Cath lab with incomplete PCI RPLV 9/15 unsuccessful PCI LAD. CVTS consult 9/22 CABG x 3, MAZE, MVR   Interim History / Subjective:  Patient remains on vasopressor support He has narrow pulse pressure Stated breathing is better  Objective   Blood pressure 93/77, pulse 97, temperature 98.1 F (36.7 C), temperature source Oral, resp. rate (!) 27, height '5\' 10"'$  (1.778 m), weight 96.1 kg, SpO2 94 %. PAP: (28-34)/(14-16) 34/15  FiO2 (%):  [40 %] 40 %   Intake/Output Summary (Last 24 hours) at 10/24/2021 0849 Last data filed at 10/24/2021 0800 Gross per 24 hour  Intake 2218.84 ml  Output 2715 ml  Net -496.16 ml   Filed Weights   10/22/21 0548 10/23/21 0500 10/24/21 0500  Weight: 85.3 kg 93.2 kg 96.1 kg    Examination: Physical exam: General: Acutely ill-appearing male, sitting on the couch HEENT: [place]/AT, eyes anicteric.  moist mucus membranes Neuro: Lethargic but opens eyes with vocal stimuli, following simple commands, moving all 4 extremities Chest: Decreased air  entry at the bases, no wheezes or rhonchi, chest tube in place with pacer wire Heart: Paced Rhythm, no murmurs or gallops Abdomen: Soft, nontender, nondistended, bowel sounds present Skin: Ecchymosis around the neck right side more than left   Resolved Hospital Problem list     Assessment & Plan:  Acute inferior STEMI, CAD s/p CABG x3 Mitral insufficiency s/p MVR pAfib s/p MAZE  Shock -- cardiogenic (most likely) +/- vasoplegia TCTS is following Continue aspirin and statin Chest tube management per CVTS Remain on vasopressor support, titrate with map goal 65 Started on midodrine Phenylephrine was stopped Still on epinephrine  Acute respiratory failure with hypoxia due to pulmonary edema Remains on nasal cannula oxygen CPAP was stopped Continue gentle diuresis Monitor intake and output Encourage incentive spirometry  Expected post op ABLA in setting of cardiac surgeries above Monitor H&H and transfuse per CVTS recommendations  AKI, likely contrast-induced Serum creatinine remained stable, baseline is around 0.9 Avoid nephrotoxic agent  Dm2, controlled Blood sugars are better controlled Insulin infusion was stopped, currently on Levemir  Continue sliding scale with CBG goal 140-180  Best Practice (right click and "Reselect all SmartList Selections" daily)   Diet/type: Consistent carbohydrate diet DVT prophylaxis: not indicated GI prophylaxis: PPI Lines: Central line, Arterial Line, and yes and it is still needed Foley:  Yes, and it is still needed Code Status:  full code Last date of multidisciplinary goals of care discussion [patient family was updated at bedside on 9/23]  Labs   CBC: Recent Labs  Lab 10/22/21 1336 10/22/21 1343 10/22/21 1844 10/22/21 2113 10/22/21 2240 10/23/21 0403 10/23/21 1532 10/24/21 0350  WBC 14.5*  --  10.6*  --   --  8.9 12.3* 11.8*  HGB 12.0*   < > 10.7* 10.2* 10.5* 10.6* 10.7* 9.7*  HCT 35.1*   < > 32.8* 30.0* 31.0* 31.0*  31.2* 28.8*  MCV 86.5  --  88.2  --   --  87.6 87.6 86.7  PLT 249  --  302  --   --  285 273 275   < > = values in this interval not displayed.    Basic Metabolic Panel: Recent Labs  Lab 10/21/21 1207 10/22/21 0813 10/22/21 1225 10/22/21 1343 10/22/21 1844 10/22/21 2113 10/22/21 2240 10/23/21 0403 10/23/21 1532 10/24/21 0350  NA 138   < > 138   < > 141 140 141 138 134* 136  K 4.0   < > 3.9   < > 4.3 5.0 4.6 4.9 5.6* 4.4  CL 105   < > 102  --  111  --   --  107 104 104  CO2 27  --   --   --  20*  --   --  21* 19* 21*  GLUCOSE 109*   < > 125*  --  120*  --   --  146* 199* 163*  BUN 16   < > 13  --  12  --   --  '14 11 11  '$ CREATININE 1.41*   < > 0.90  --  1.24  --   --  1.37* 1.34* 1.38*  CALCIUM 9.8  --   --   --  7.9*  --   --  7.9* 7.9* 8.1*  MG  --   --   --   --  3.0*  --   --  2.4 2.2  --    < > = values in this interval not displayed.   GFR: Estimated Creatinine Clearance: 52.9 mL/min (A) (by C-G formula based on SCr of 1.38 mg/dL (H)). Recent Labs  Lab 10/22/21 1844 10/23/21 0403 10/23/21 1532 10/24/21 0350  WBC 10.6* 8.9 12.3* 11.8*    Liver Function Tests: Recent Labs  Lab 10/21/21 1207  AST 30  ALT 29  ALKPHOS 49  BILITOT 0.8  PROT 7.3  ALBUMIN 4.0   No results for input(s): "LIPASE", "AMYLASE" in the last 168 hours. No results for input(s): "AMMONIA" in the last 168 hours.  ABG    Component Value Date/Time   PHART 7.293 (L) 10/22/2021 2240   PCO2ART 38.4 10/22/2021 2240   PO2ART 102 10/22/2021 2240   HCO3 18.4 (L) 10/22/2021 2240   TCO2 20 (L) 10/22/2021 2240   ACIDBASEDEF 7.0 (H) 10/22/2021 2240   O2SAT 97 10/22/2021 2240     Coagulation Profile: Recent Labs  Lab 10/22/21 1336  INR 1.4*    Cardiac Enzymes: No results for input(s): "CKTOTAL", "CKMB", "CKMBINDEX", "TROPONINI" in the last 168 hours.  HbA1C: Hgb A1c MFr Bld  Date/Time Value Ref Range Status  09/02/2021 09:54 AM 7.1 (H) 4.6 - 6.5 % Final    Comment:    Glycemic  Control Guidelines for People with Diabetes:Non Diabetic:  <6%Goal of Therapy: <7%Additional Action Suggested:  >8%   05/12/2021 09:02 AM 6.8 (H) 4.6 - 6.5 % Final    Comment:    Glycemic Control Guidelines for People with Diabetes:Non Diabetic:  <6%Goal of Therapy: <7%Additional Action Suggested:  >  8%     CBG: Recent Labs  Lab 10/23/21 1536 10/23/21 2028 10/23/21 2321 10/24/21 0401 10/24/21 0836  GLUCAP 215* 192* 210* 155* 139*    Critical care time:      Total critical care time: 32 minutes  Performed by: Eagar care time was exclusive of separately billable procedures and treating other patients.   Critical care was necessary to treat or prevent imminent or life-threatening deterioration.   Critical care was time spent personally by me on the following activities: development of treatment plan with patient and/or surrogate as well as nursing, discussions with consultants, evaluation of patient's response to treatment, examination of patient, obtaining history from patient or surrogate, ordering and performing treatments and interventions, ordering and review of laboratory studies, ordering and review of radiographic studies, pulse oximetry and re-evaluation of patient's condition.   Jacky Kindle, MD [facility] See Amion for pager If no response to pager, please call [phone] until 7pm After 7pm, Please call E-link [phone]

## 2021-10-24 NOTE — Progress Notes (Signed)
Patient ID: Jon [name], male   DOB: 07/24/1945, 76 y.o.   MRN: [id number]  TCTS Evening Rounds:  Epi weaned off today. MAP about 70.  Atrial paced at 90.  Sats 95% 2L.  UO good today. -832 cc so far.

## 2021-10-24 NOTE — Progress Notes (Signed)
2 Days Post-Op Procedure(s) (LRB): CORONARY ARTERY BYPASS GRAFTING (CABG) X THREE, USING LEFT INTERNAL MAMMARY ARTERY AND RIGHT LEG GREATER SAPHENOUS VEIN HARVESTED ENDOSCOPICALLY (N/A) MAZE (N/A) TRANSESOPHAGEAL ECHOCARDIOGRAM (TEE) (N/A) MITRAL VALVE REPAIR (MVR) USING MCCARTHY-ADAMS RING SIZE 30MM (N/A) Subjective: Some nausea this am. No significant pain.  Atrial paced 90. Underlying rhythm is junctional 50's  Objective: Vital signs in last 24 hours: Temp:  [98 F (36.7 C)-99.3 F (37.4 C)] 98.1 F (36.7 C) (09/24 0840) Pulse Rate:  [79-97] 97 (09/24 0700) Cardiac Rhythm: Atrial paced (09/24 0600) Resp:  [15-38] 27 (09/24 0700) BP: (86-110)/(60-88) 93/77 (09/24 0700) SpO2:  [91 %-100 %] 94 % (09/24 0700) Arterial Line BP: (72-124)/(50-94) 105/59 (09/24 0700) FiO2 (%):  [40 %] 40 % (09/24 0000) Weight:  [96.1 kg] 96.1 kg (09/24 0500)  Hemodynamic parameters for last 24 hours: PAP: (28-34)/(14-16) 34/15  Intake/Output from previous day: 09/23 0701 - 09/24 0700 In: 2327.2 [P.O.:640; I.V.:1426.5; IV Piggyback:260.7] Out: 2820 [Urine:2470; Chest Tube:350] Intake/Output this shift: Total I/O In: -  Out: 20 [Chest Tube:20]  General appearance: alert and cooperative Neurologic: intact Heart: regular rate and rhythm, S1, S2 normal, no murmur Lungs: clear to auscultation bilaterally Abd: + BS, soft, nontender Extremities: edema moderate Wound: dressing dry  Lab Results: Recent Labs    10/23/21 1532 10/24/21 0350  WBC 12.3* 11.8*  HGB 10.7* 9.7*  HCT 31.2* 28.8*  PLT 273 275   BMET:  Recent Labs    10/23/21 1532 10/24/21 0350  NA 134* 136  K 5.6* 4.4  CL 104 104  CO2 19* 21*  GLUCOSE 199* 163*  BUN 11 11  CREATININE 1.34* 1.38*  CALCIUM 7.9* 8.1*    PT/INR:  Recent Labs    10/22/21 1336  LABPROT 17.0*  INR 1.4*   ABG    Component Value Date/Time   PHART 7.293 (L) 10/22/2021 2240   HCO3 18.4 (L) 10/22/2021 2240   TCO2 20 (L) 10/22/2021 2240    ACIDBASEDEF 7.0 (H) 10/22/2021 2240   O2SAT 97 10/22/2021 2240   CBG (last 3)  Recent Labs    10/23/21 2321 10/24/21 0401 10/24/21 0836  GLUCAP 210* 155* 139*   CXR: ok  Assessment/Plan: S/P Procedure(s) (LRB): CORONARY ARTERY BYPASS GRAFTING (CABG) X THREE, USING LEFT INTERNAL MAMMARY ARTERY AND RIGHT LEG GREATER SAPHENOUS VEIN HARVESTED ENDOSCOPICALLY (N/A) MAZE (N/A) TRANSESOPHAGEAL ECHOCARDIOGRAM (TEE) (N/A) MITRAL VALVE REPAIR (MVR) USING MCCARTHY-ADAMS RING SIZE 30MM (N/A)  POD 2  He is still on Epi 5 this am for BP support in addition to Midodrine. Wean Epi as tolerated. No beta blocker at this time since on pressors and rhythm is junctional 50's under pacer.  Volume excess: -492 cc yesterday. If wt is accurate he is 24 lbs over preop but I don't think he gained 7 lbs yesterday. Will start him on Demedex 40 po daily to try to effect a continued diuresis that is more gentle utill of Epi. Creat stable.  DC chest tubes today.  Reglan for nausea  IS, OOB, continue ambulation.     LOS: 11 days    Gaye Pollack 10/24/2021

## 2021-10-24 NOTE — Progress Notes (Signed)
Hypoglycemic Event  CBG: 69  Treatment: D50 25 mL (12.5 gm)  Symptoms: None  Follow-up CBG: Time:1638 CBG Result:142  Possible Reasons for Event: Inadequate meal intake  Comments/MD notified:NA    Lourdes Sledge

## 2021-10-24 NOTE — Progress Notes (Signed)
Patient had been up to chair prior in shift and ambulated well and tolerated being in the chair for a good portion of the day. Patient was returned to bed to get chest tubes discontinued per orders. When dinner arrived patient wanted to ambulate in the hall and sit up in the chair for dinner. Patient had no symptoms while going from a laying to sitting position. Patient sat on the side of the bed with nurses present prior to standing. Patient stood up with very minimal assist but prior to beginning to walk began bearing weight onto his elbows on the four wheel walker (which was locked) and began to gaze up. Patient was responsive but appeared weak. We attempted to recycle a blood pressure while the patient was standing but were unable to obtain one due to cuff placement and the patient's arm being bent. The decision was made to safely return the patient to bed and place an order for PT/OT. Patient placed in cardiac chair position and asymptomatic. BP was 97/71 (80) when sitting in cardiac chair.  Margaretha Seeds, RN

## 2021-10-25 ENCOUNTER — Inpatient Hospital Stay (HOSPITAL_COMMUNITY): Payer: Medicare Other

## 2021-10-25 ENCOUNTER — Encounter (HOSPITAL_COMMUNITY): Payer: Self-pay | Admitting: Thoracic Surgery (Cardiothoracic Vascular Surgery)

## 2021-10-25 DIAGNOSIS — R9431 Abnormal electrocardiogram [ECG] [EKG]: Secondary | ICD-10-CM | POA: Diagnosis not present

## 2021-10-25 DIAGNOSIS — Z9889 Other specified postprocedural states: Secondary | ICD-10-CM | POA: Diagnosis not present

## 2021-10-25 LAB — BPAM RBC
Blood Product Expiration Date: 202310112359
Blood Product Expiration Date: 202310112359
Blood Product Expiration Date: 202310232359
Blood Product Expiration Date: 202310232359
ISSUE DATE / TIME: 202309221030
ISSUE DATE / TIME: 202309221030
ISSUE DATE / TIME: 202309221030
ISSUE DATE / TIME: 202309221444
Unit Type and Rh: 5100
Unit Type and Rh: 5100
Unit Type and Rh: 9500
Unit Type and Rh: 9500

## 2021-10-25 LAB — BASIC METABOLIC PANEL
Anion gap: 8 (ref 5–15)
BUN: 13 mg/dL (ref 8–23)
CO2: 26 mmol/L (ref 22–32)
Calcium: 8.4 mg/dL — ABNORMAL LOW (ref 8.9–10.3)
Chloride: 102 mmol/L (ref 98–111)
Creatinine, Ser: 1.41 mg/dL — ABNORMAL HIGH (ref 0.61–1.24)
GFR, Estimated: 52 mL/min — ABNORMAL LOW (ref >60)
Glucose, Bld: 117 mg/dL — ABNORMAL HIGH (ref 70–99)
Potassium: 4.2 mmol/L (ref 3.5–5.1)
Sodium: 136 mmol/L (ref 135–145)

## 2021-10-25 LAB — CBC
HCT: 26.6 % — ABNORMAL LOW (ref 39.0–52.0)
Hemoglobin: 8.9 g/dL — ABNORMAL LOW (ref 13.0–17.0)
MCH: 29.6 pg (ref 26.0–34.0)
MCHC: 33.5 g/dL (ref 30.0–36.0)
MCV: 88.4 fL (ref 80.0–100.0)
Platelets: 260 10*3/uL (ref 150–400)
RBC: 3.01 MIL/uL — ABNORMAL LOW (ref 4.22–5.81)
RDW: 15.5 % (ref 11.5–15.5)
WBC: 11.5 10*3/uL — ABNORMAL HIGH (ref 4.0–10.5)
nRBC: 0 % (ref 0.0–0.2)

## 2021-10-25 LAB — TYPE AND SCREEN
ABO/RH(D): O NEG
Antibody Screen: NEGATIVE
Unit division: 0
Unit division: 0
Unit division: 0
Unit division: 0

## 2021-10-25 LAB — GLUCOSE, CAPILLARY
Glucose-Capillary: 109 mg/dL — ABNORMAL HIGH (ref 70–99)
Glucose-Capillary: 120 mg/dL — ABNORMAL HIGH (ref 70–99)
Glucose-Capillary: 121 mg/dL — ABNORMAL HIGH (ref 70–99)
Glucose-Capillary: 122 mg/dL — ABNORMAL HIGH (ref 70–99)
Glucose-Capillary: 166 mg/dL — ABNORMAL HIGH (ref 70–99)
Glucose-Capillary: 85 mg/dL (ref 70–99)

## 2021-10-25 LAB — ECHOCARDIOGRAM COMPLETE
AR max vel: 2.2 cm2
AV Area VTI: 1.92 cm2
AV Area mean vel: 2.19 cm2
AV Mean grad: 6 mmHg
AV Peak grad: 12 mmHg
Ao pk vel: 1.73 m/s
Area-P 1/2: 3.15 cm2
Height: 70 in
S' Lateral: 2.8 cm
Weight: 3248.7 oz

## 2021-10-25 MED ORDER — ASPIRIN 81 MG PO TBEC
81.0000 mg | DELAYED_RELEASE_TABLET | Freq: Every day | ORAL | Status: DC
  Administered 2021-10-25 – 2021-11-02 (×9): 81 mg via ORAL
  Filled 2021-10-25 (×9): qty 1

## 2021-10-25 MED ORDER — WARFARIN SODIUM 2.5 MG PO TABS
2.5000 mg | ORAL_TABLET | Freq: Once | ORAL | Status: AC
  Administered 2021-10-25: 2.5 mg via ORAL
  Filled 2021-10-25: qty 1

## 2021-10-25 MED ORDER — VASOPRESSIN 20 UNITS/100 ML INFUSION FOR SHOCK
0.0400 [IU]/min | INTRAVENOUS | Status: DC
  Administered 2021-10-25 – 2021-10-26 (×4): 0.04 [IU]/min via INTRAVENOUS
  Filled 2021-10-25 (×4): qty 100

## 2021-10-25 MED ORDER — NOREPINEPHRINE 4 MG/250ML-% IV SOLN
INTRAVENOUS | Status: AC
  Filled 2021-10-25: qty 250

## 2021-10-25 MED ORDER — WARFARIN - PHARMACIST DOSING INPATIENT: Freq: Every day | Status: DC

## 2021-10-25 MED ORDER — INSULIN DETEMIR 100 UNIT/ML ~~LOC~~ SOLN
10.0000 [IU] | Freq: Two times a day (BID) | SUBCUTANEOUS | Status: DC
  Filled 2021-10-25 (×2): qty 0.1

## 2021-10-25 NOTE — Progress Notes (Signed)
NAME:  Jon [name], MRN:  [id number], DOB:  Aug 04, 1945, LOS: 12 ADMISSION DATE:  10/13/2021, CONSULTATION DATE:  10/22/21 REFERRING MD:  Lavonna Monarch - CVTS, CHIEF COMPLAINT:  s/p  CABG   History of Present Illness:  76 yo M PMH HTN, pAFib not on AC, OSA, hx tobacco use, DM2 presented 9/13  chest pain + near syncope initially leaving the ED AMA but became too dizzy and SOB to elope. Admitted to [facility] with NSTEMI, cards consulted 9/13 and went for cath unsuccessful PCI RPLV. 9/15 unsuccessful PCI LAD. CVTS consult 9/15 for operative evaluation.   Underwent CABG x 3, MAZE, MVR 9/22 and transferred to ICU after case   PCCM is consulted for help with medical management  Pertinent  Medical History  Afib OSA on CPAP Tobacco use HTN HLD  CAD Mitral insufficiency   Significant Hospital Events: Including procedures, antibiotic start and stop dates in addition to other pertinent events   9/13 admit to [facility] w inferior STEMI. Cath lab with incomplete PCI RPLV 9/15 unsuccessful PCI LAD. CVTS consult 9/22 CABG x 3, MAZE, MVR  9/25 No acute issues overnight, remains on low dose Epi drip   Interim History / Subjective:  Seen lying in bed with no acute complaints   Objective   Blood pressure 108/70, pulse 87, temperature 98.6 F (37 C), temperature source Oral, resp. rate 19, height '5\' 10"'$  (1.778 m), weight 92.1 kg, SpO2 95 %.        Intake/Output Summary (Last 24 hours) at 10/25/2021 0940 Last data filed at 10/25/2021 0900 Gross per 24 hour  Intake 464.7 ml  Output 1685 ml  Net -1220.3 ml    Filed Weights   10/23/21 0500 10/24/21 0500 10/25/21 0500  Weight: 93.2 kg (S) 93.1 kg 92.1 kg    Examination: General: Acute on chronically ill appearing elderly  male/male on mechanical ventilation, in NAD HEENT: [place]/AT, MM pink/moist, PERRL,  Neuro: Alert and oriented x3, non-focal  CV: s1s2 regular rate and rhythm, no murmur, rubs, or gallops,  PULM:  Clear to ascultation, no increased work  of breathing, no added breath sounds  GI: soft, bowel sounds active in all 4 quadrants, non-tender, non-distended, tolerating oral diet  Extremities: warm/dry, no edema  Skin: no rashes or lesions  Resolved Hospital Problem list     Assessment & Plan:  Acute inferior STEMI CAD s/p CABG x3 Mitral insufficiency s/p MVR pAfib s/p MAZE  Shock  -Cardiogenic (most likely) +/- vasoplegia P: Primary management per cardiothoracic surgery Continue aspirin and statin Continues to require low-dose vasopressor support Continue midodrine Cardiothoracic surgery plans to obtain echocardiogram this a.m. Further diuretics per primary Continuous telemetry Closely monitor hemodynamics  Acute respiratory failure with hypoxia due to pulmonary edema P: Continue supplemental oxygen for sat goal greater than 92%  Wean supplemental oxygen as able Frequent and adequate pulmonary hygiene Mobilize as able  Expected post op ABLA -In setting of cardiac surgeries above P: Trend CBC Transfuse per protocol Hemoglobin goal greater than 7  AKI, likely contrast-induced -Serum creatinine remained stable, baseline is around 0.9 P: Renal function remains stable Follow renal function  Monitor urine output Trend Bmet Avoid nephrotoxins Ensure adequate renal perfusion   Dm2, controlled P: Continue SSI as needed CBG goal 140-180  Best Practice (right click and "Reselect all SmartList Selections" daily)   Diet/type: Consistent carbohydrate diet DVT prophylaxis: not indicated GI prophylaxis: PPI Lines: Central line, Arterial Line, and yes and it is still needed Foley:  Yes,  and it is still needed Code Status:  full code Last date of multidisciplinary goals of care discussion: Continue to update patient and family daily   Critical care: 37 mins  Edvardo Honse D. Kenton Kingfisher, NP-C [facility] Personal contact information can be found on Amion  10/25/2021, 9:46 AM

## 2021-10-25 NOTE — Evaluation (Addendum)
Physical Therapy Evaluation Patient Details Name: Jon [name] MRN: [id number] DOB: 03/06/1945 Today's Date: 10/25/2021  History of Present Illness  Patient is a 76 y/o male who presents on 10/13/21 with CP and lightheadedness. Found to have A-fib with SSS, symptomatic bradycardia, STEMI s/p unsuccessful PCI 9/13 s/p CABG x3, MAZE and MVR 10/22/21. PMH includes A-fib, DM, glaucoma, HTN, sleep apnea.  Clinical Impression  Patient presents with generalized weakness, impaired activity tolerance, decreased endurance, dyspnea on exertion and impaired mobility s/p above. Pt is independent and lives alone PTA- does IADLs and drives. Today, pt requires mIn A for bed mobility (with HOB elevated), transfers and gait training with use of RW for support. Fatigues with activity with drop in BP. BP pre activity 98/65 (70), post activity BP 87/41 (53). RN aware. Educated pt on sternal precautions and "move in the tube." Encouraged walking 2 more times today with nursing as able. Hoping pt will progress well enough to return home with intermittent support. Will follow acutely to maximize independence and mobility prior to return home.       Recommendations for follow up therapy are one component of a multi-disciplinary discharge planning process, led by the attending physician.  Recommendations may be updated based on patient status, additional functional criteria and insurance authorization.  Follow Up Recommendations Home health PT (pending improvement)      Assistance Recommended at Discharge Frequent or constant Supervision/Assistance  Patient can return home with the following  A little help with walking and/or transfers;A little help with bathing/dressing/bathroom;Help with stairs or ramp for entrance;Assist for transportation;Assistance with cooking/housework    Equipment Recommendations None recommended by PT  Recommendations for Other Services       Functional Status Assessment Patient has had a  recent decline in their functional status and demonstrates the ability to make significant improvements in function in a reasonable and predictable amount of time.     Precautions / Restrictions Precautions Precautions: Fall;Sternal;Other (comment) Precaution Booklet Issued: No Precaution Comments: Reviewed sternal precautions and "move in the tube", external pacer Restrictions Weight Bearing Restrictions: Yes Other Position/Activity Restrictions: sternal      Mobility  Bed Mobility Overal bed mobility: Needs Assistance Bed Mobility: Supine to Sit     Supine to sit: Min assist, HOB elevated     General bed mobility comments: Able to move LEs to EOB, assist with trunk to get to EOB, increased time, hugging heart pillow    Transfers Overall transfer level: Needs assistance Equipment used: Rolling walker (2 wheels) Transfers: Sit to/from Stand, Bed to chair/wheelchair/BSC Sit to Stand: Min assist           General transfer comment: Min A to power to standing with cues for momentum, hand placement on knees and forward momentum/weight shift. Stood from [employer]. Transferred to chair post ambulation.    Ambulation/Gait Ambulation/Gait assistance: Min assist Gait Distance (Feet): 80 Feet Assistive device: Rolling walker (2 wheels) Gait Pattern/deviations: Step-to pattern, Step-through pattern, Decreased stride length, Decreased step length - right, Decreased step length - left Gait velocity: decreased Gait velocity interpretation: <1.31 ft/sec, indicative of household ambulator   General Gait Details: Slow, shuffling like gait with heavy reliance on UEs on RW. 2/4 DOE. VSS. Pt withi drop in BP with walking with map of 53 (87/41) but asymptomatic. RN aware.  Stairs            Wheelchair Mobility    Modified Rankin (Stroke Patients Only)       Balance Overall  balance assessment: Needs assistance Sitting-balance support: Feet supported, No upper extremity  supported Sitting balance-Leahy Scale: Fair     Standing balance support: During functional activity, Reliant on assistive device for balance Standing balance-Leahy Scale: Poor Standing balance comment: Reliant on UEs for support in standing                             Pertinent Vitals/Pain Pain Assessment Pain Assessment: No/denies pain    Home Living Family/patient expects to be discharged to:: Private residence Living Arrangements: Alone   Type of Home: House Home Access: Stairs to enter Entrance Stairs-Rails: Right Entrance Stairs-Number of Steps: 4   Home Layout: One level Home Equipment: Conservation officer, nature (2 wheels);Rollator (4 wheels);BSC/3in1;Shower seat;Transport chair      Prior Function Prior Level of Function : Independent/Modified Independent             Mobility Comments: Independent, drives. No falls reported ADLs Comments: independent.     Hand Dominance        Extremity/Trunk Assessment   Upper Extremity Assessment Upper Extremity Assessment: Defer to OT evaluation    Lower Extremity Assessment Lower Extremity Assessment: Generalized weakness (but functional)    Cervical / Trunk Assessment Cervical / Trunk Assessment: Normal  Communication   Communication: No difficulties  Cognition Arousal/Alertness: Awake/alert Behavior During Therapy: Flat affect Overall Cognitive Status: Within Functional Limits for tasks assessed                                 General Comments: Appears WFL for basic mobility tasks. Flat. tired.        General Comments General comments (skin integrity, edema, etc.): BP pre activity 98/65 (70), post activity BP 87/41 (53). RN aware.    Exercises     Assessment/Plan    PT Assessment Patient needs continued PT services  PT Problem List Decreased strength;Decreased mobility;Decreased balance;Decreased knowledge of use of DME;Cardiopulmonary status limiting activity;Decreased skin  integrity;Decreased activity tolerance;Decreased knowledge of precautions       PT Treatment Interventions Therapeutic activities;Gait training;Therapeutic exercise;Patient/family education;Stair training;Functional mobility training;Balance training;DME instruction    PT Goals (Current goals can be found in the Care Plan section)  Acute Rehab PT Goals Patient Stated Goal: get better and go home PT Goal Formulation: With patient Time For Goal Achievement: 11/08/21 Potential to Achieve Goals: Good    Frequency Min 3X/week     Co-evaluation               AM-PAC PT "6 Clicks" Mobility  Outcome Measure Help needed turning from your back to your side while in a flat bed without using bedrails?: A Little Help needed moving from lying on your back to sitting on the side of a flat bed without using bedrails?: A Lot Help needed moving to and from a bed to a chair (including a wheelchair)?: A Little Help needed standing up from a chair using your arms (e.g., wheelchair or bedside chair)?: A Little Help needed to walk in hospital room?: A Little Help needed climbing 3-5 steps with a railing? : Total 6 Click Score: 15    End of Session Equipment Utilized During Treatment: Gait belt Activity Tolerance: Patient tolerated treatment well Patient left: in chair;with call bell/phone within reach;with nursing/sitter in room Nurse Communication: Mobility status PT Visit Diagnosis: Difficulty in walking, not elsewhere classified (R26.2);Muscle weakness (generalized) (M62.81)  Time: 4163-8453 PT Time Calculation (min) (ACUTE ONLY): 21 min   Charges:   PT Evaluation $PT Eval Moderate Complexity: 1 Mod          Marisa Severin, PT, DPT Acute Rehabilitation Services Secure chat preferred Office [place] 10/25/2021, 12:13 PM

## 2021-10-25 NOTE — Progress Notes (Signed)
      [address]       [place] [address]             [phone]      POD # 3 CABG, MV repair, Maze  Resting comfortably  On vasopressin, weaning epi BP 116/86   Pulse 93   Temp 98.9 F (37.2 C) (Oral)   Resp 16   Ht '5\' 10"'$  (1.778 m)   Wt 92.1 kg   SpO2 93%   BMI 29.13 kg/m    Intake/Output Summary (Last 24 hours) at 10/25/2021 1657 Last data filed at 10/25/2021 1500 Gross per 24 hour  Intake 517.79 ml  Output 810 ml  Net -292.21 ml   CBG well controlled  Continue current care  Londan Coplen C. Roxan Hockey, MD [facility] [phone]

## 2021-10-25 NOTE — Progress Notes (Signed)
  Echocardiogram 2D Echocardiogram has been performed.  Jon [name] 10/25/2021, 9:44 AM

## 2021-10-25 NOTE — Progress Notes (Signed)
[address]       [place] [address]             [phone]      3 Days Post-Op  Procedure(s) (LRB): CORONARY ARTERY BYPASS GRAFTING (CABG) X THREE, USING LEFT INTERNAL MAMMARY ARTERY AND RIGHT LEG GREATER SAPHENOUS VEIN HARVESTED ENDOSCOPICALLY (N/A) MAZE (N/A) TRANSESOPHAGEAL ECHOCARDIOGRAM (TEE) (N/A) MITRAL VALVE REPAIR (MVR) USING MCCARTHY-ADAMS RING SIZE 30MM (N/A)   Total Length of Stay:  LOS: 12 days    SUBJECTIVE: Pt feels tired this am. No CP or SOB Had to go back on epi overnight for lower BP  Vitals:   10/25/21 0822 10/25/21 0829  BP:    Pulse:    Resp:    Temp:  98.6 F (37 C)  SpO2: 95%     Intake/Output      09/24 0701 09/25 0700 09/25 0701 09/26 0700   P.O. 560    I.V. (mL/kg) 182.2 (2)    Other 45    IV Piggyback     Total Intake(mL/kg) 787.2 (8.5)    Urine (mL/kg/hr) 1745 (0.8)    Emesis/NG output 0    Stool 0    Chest Tube 20    Total Output 1765    Net -977.8         Stool Occurrence 0 x    Emesis Occurrence 0 x        sodium chloride     sodium chloride Stopped (10/24/21 0904)   sodium chloride Stopped (10/24/21 0700)   epinephrine 3 mcg/min (10/25/21 0700)   lactated ringers     lactated ringers Stopped (10/24/21 0858)    CBC    Component Value Date/Time   WBC 11.5 (H) 10/25/2021 0332   RBC 3.01 (L) 10/25/2021 0332   HGB 8.9 (L) 10/25/2021 0332   HGB 15.8 03/25/2014 1049   HCT 26.6 (L) 10/25/2021 0332   HCT 48.6 03/25/2014 1049   PLT 260 10/25/2021 0332   PLT 332 03/25/2014 1049   MCV 88.4 10/25/2021 0332   MCV 85.9 03/25/2014 1049   MCH 29.6 10/25/2021 0332   MCHC 33.5 10/25/2021 0332   RDW 15.5 10/25/2021 0332   RDW 15.0 (H) 03/25/2014 1049   LYMPHSABS 3.8 10/13/2021 0510   LYMPHSABS 2.0 03/25/2014 1049   MONOABS 1.0 10/13/2021 0510   MONOABS 0.8 03/25/2014 1049   EOSABS 0.4 10/13/2021 0510   EOSABS 0.2 03/25/2014 1049   BASOSABS 0.1 10/13/2021 0510   BASOSABS 0.0 03/25/2014 1049    CMP     Component Value Date/Time   NA 136 10/25/2021 0332   K 4.2 10/25/2021 0332   CL 102 10/25/2021 0332   CO2 26 10/25/2021 0332   GLUCOSE 117 (H) 10/25/2021 0332   BUN 13 10/25/2021 0332   CREATININE 1.41 (H) 10/25/2021 0332   CREATININE 1.20 02/15/2011 0846   CALCIUM 8.4 (L) 10/25/2021 0332   PROT 7.3 10/21/2021 1207   ALBUMIN 4.0 10/21/2021 1207   AST 30 10/21/2021 1207   ALT 29 10/21/2021 1207   ALKPHOS 49 10/21/2021 1207   BILITOT 0.8 10/21/2021 1207   GFRNONAA 52 (L) 10/25/2021 0332   GFRAA 52 (L) 11/23/2015 2118   ABG    Component Value Date/Time   PHART 7.293 (L) 10/22/2021 2240   PCO2ART 38.4 10/22/2021 2240   PO2ART 102 10/22/2021 2240   HCO3 18.4 (L) 10/22/2021 2240   TCO2 20 (L) 10/22/2021 2240   ACIDBASEDEF 7.0 (H) 10/22/2021  2240   O2SAT 97 10/22/2021 2240   CBG (last 3)  Recent Labs    10/24/21 2143 10/25/21 0005 10/25/21 0808  GLUCAP 85 122* 120*   EXAM: GENERAL: looks depressed LUNGS: overall clear CARDIAC: RR with no murmur.  EXT: warm and dry   TEMP PACER: Underlying rhythm is junctional. Has more frequent PVCs when in own rhythm. 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 Has need for BP support inotropically. Will obtain bedside echo to assess function and valve. If EF still preserved will add vaso. Hold on diuretics today Start coumadin Encouraged diet today   Coralie Common, MD '@DATE'$ @

## 2021-10-26 ENCOUNTER — Inpatient Hospital Stay (HOSPITAL_COMMUNITY): Payer: Medicare Other

## 2021-10-26 DIAGNOSIS — R9431 Abnormal electrocardiogram [ECG] [EKG]: Secondary | ICD-10-CM | POA: Diagnosis not present

## 2021-10-26 LAB — PREPARE FRESH FROZEN PLASMA: Unit division: 0

## 2021-10-26 LAB — BASIC METABOLIC PANEL
Anion gap: 8 (ref 5–15)
BUN: 21 mg/dL (ref 8–23)
CO2: 25 mmol/L (ref 22–32)
Calcium: 8.1 mg/dL — ABNORMAL LOW (ref 8.9–10.3)
Chloride: 101 mmol/L (ref 98–111)
Creatinine, Ser: 1.39 mg/dL — ABNORMAL HIGH (ref 0.61–1.24)
GFR, Estimated: 53 mL/min — ABNORMAL LOW (ref >60)
Glucose, Bld: 132 mg/dL — ABNORMAL HIGH (ref 70–99)
Potassium: 4.3 mmol/L (ref 3.5–5.1)
Sodium: 134 mmol/L — ABNORMAL LOW (ref 135–145)

## 2021-10-26 LAB — BPAM FFP
Blood Product Expiration Date: 202309262359
Blood Product Expiration Date: 202309262359
ISSUE DATE / TIME: 202309221024
ISSUE DATE / TIME: 202309221024
Unit Type and Rh: 5100
Unit Type and Rh: 5100

## 2021-10-26 LAB — PROTIME-INR
INR: 1.4 — ABNORMAL HIGH (ref 0.8–1.2)
Prothrombin Time: 17.1 seconds — ABNORMAL HIGH (ref 11.4–15.2)

## 2021-10-26 LAB — MAGNESIUM: Magnesium: 2.1 mg/dL (ref 1.7–2.4)

## 2021-10-26 LAB — CBC
HCT: 26 % — ABNORMAL LOW (ref 39.0–52.0)
Hemoglobin: 8.5 g/dL — ABNORMAL LOW (ref 13.0–17.0)
MCH: 29.5 pg (ref 26.0–34.0)
MCHC: 32.7 g/dL (ref 30.0–36.0)
MCV: 90.3 fL (ref 80.0–100.0)
Platelets: 318 10*3/uL (ref 150–400)
RBC: 2.88 MIL/uL — ABNORMAL LOW (ref 4.22–5.81)
RDW: 15.5 % (ref 11.5–15.5)
WBC: 11.5 10*3/uL — ABNORMAL HIGH (ref 4.0–10.5)
nRBC: 0.4 % — ABNORMAL HIGH (ref 0.0–0.2)

## 2021-10-26 LAB — GLUCOSE, CAPILLARY
Glucose-Capillary: 122 mg/dL — ABNORMAL HIGH (ref 70–99)
Glucose-Capillary: 130 mg/dL — ABNORMAL HIGH (ref 70–99)
Glucose-Capillary: 136 mg/dL — ABNORMAL HIGH (ref 70–99)
Glucose-Capillary: 140 mg/dL — ABNORMAL HIGH (ref 70–99)
Glucose-Capillary: 172 mg/dL — ABNORMAL HIGH (ref 70–99)
Glucose-Capillary: 55 mg/dL — ABNORMAL LOW (ref 70–99)
Glucose-Capillary: 59 mg/dL — ABNORMAL LOW (ref 70–99)
Glucose-Capillary: 73 mg/dL (ref 70–99)
Glucose-Capillary: 74 mg/dL (ref 70–99)

## 2021-10-26 MED ORDER — WARFARIN SODIUM 5 MG PO TABS
5.0000 mg | ORAL_TABLET | Freq: Once | ORAL | Status: AC
  Administered 2021-10-26: 5 mg via ORAL
  Filled 2021-10-26: qty 1

## 2021-10-26 MED ORDER — MIDODRINE HCL 5 MG PO TABS
15.0000 mg | ORAL_TABLET | Freq: Three times a day (TID) | ORAL | Status: DC
  Administered 2021-10-26 – 2021-11-02 (×20): 15 mg via ORAL
  Filled 2021-10-26 (×20): qty 3

## 2021-10-26 MED ORDER — FUROSEMIDE 10 MG/ML IJ SOLN
40.0000 mg | Freq: Once | INTRAMUSCULAR | Status: AC
  Administered 2021-10-26: 40 mg via INTRAVENOUS
  Filled 2021-10-26: qty 4

## 2021-10-26 NOTE — Progress Notes (Signed)
[address]       [place] [address]             [phone]      4 Days Post-Op  Procedure(s) (LRB): CORONARY ARTERY BYPASS GRAFTING (CABG) X THREE, USING LEFT INTERNAL MAMMARY ARTERY AND RIGHT LEG GREATER SAPHENOUS VEIN HARVESTED ENDOSCOPICALLY (N/A) MAZE (N/A) TRANSESOPHAGEAL ECHOCARDIOGRAM (TEE) (N/A) MITRAL VALVE REPAIR (MVR) USING MCCARTHY-ADAMS RING SIZE 30MM (N/A)   Total Length of Stay:  LOS: 13 days    SUBJECTIVE: Still appears depressed Slept well Went into rate controlled afib last night Still on vaso and low dose epi Vitals:   10/26/21 0630 10/26/21 0800  BP: (!) 120/91 (!) 87/74  Pulse: 99   Resp: 16 14  Temp:  100 F (37.8 C)  SpO2: 94%     Intake/Output      09/25 0701 09/26 0700 09/26 0701 09/27 0700   P.O.     I.V. (mL/kg) 416.7 (4.5)    Other     Total Intake(mL/kg) 416.7 (4.5)    Urine (mL/kg/hr) 880 (0.4)    Emesis/NG output     Stool 0    Chest Tube     Total Output 880    Net -463.3         Stool Occurrence 2 x        sodium chloride     sodium chloride Stopped (10/24/21 0904)   sodium chloride Stopped (10/24/21 0700)   epinephrine 2 mcg/min (10/26/21 0600)   lactated ringers     lactated ringers Stopped (10/24/21 0858)   vasopressin 0.04 Units/min (10/26/21 0600)    CBC    Component Value Date/Time   WBC 11.5 (H) 10/26/2021 0423   RBC 2.88 (L) 10/26/2021 0423   HGB 8.5 (L) 10/26/2021 0423   HGB 15.8 03/25/2014 1049   HCT 26.0 (L) 10/26/2021 0423   HCT 48.6 03/25/2014 1049   PLT 318 10/26/2021 0423   PLT 332 03/25/2014 1049   MCV 90.3 10/26/2021 0423   MCV 85.9 03/25/2014 1049   MCH 29.5 10/26/2021 0423   MCHC 32.7 10/26/2021 0423   RDW 15.5 10/26/2021 0423   RDW 15.0 (H) 03/25/2014 1049   LYMPHSABS 3.8 10/13/2021 0510   LYMPHSABS 2.0 03/25/2014 1049   MONOABS 1.0 10/13/2021 0510   MONOABS 0.8 03/25/2014 1049   EOSABS 0.4 10/13/2021 0510   EOSABS 0.2 03/25/2014 1049   BASOSABS 0.1  10/13/2021 0510   BASOSABS 0.0 03/25/2014 1049   CMP     Component Value Date/Time   NA 134 (L) 10/26/2021 0423   K 4.3 10/26/2021 0423   CL 101 10/26/2021 0423   CO2 25 10/26/2021 0423   GLUCOSE 132 (H) 10/26/2021 0423   BUN 21 10/26/2021 0423   CREATININE 1.39 (H) 10/26/2021 0423   CREATININE 1.20 02/15/2011 0846   CALCIUM 8.1 (L) 10/26/2021 0423   PROT 7.3 10/21/2021 1207   ALBUMIN 4.0 10/21/2021 1207   AST 30 10/21/2021 1207   ALT 29 10/21/2021 1207   ALKPHOS 49 10/21/2021 1207   BILITOT 0.8 10/21/2021 1207   GFRNONAA 53 (L) 10/26/2021 0423   GFRAA 52 (L) 11/23/2015 2118   ABG    Component Value Date/Time   PHART 7.293 (L) 10/22/2021 2240   PCO2ART 38.4 10/22/2021 2240   PO2ART 102 10/22/2021 2240   HCO3 18.4 (L) 10/22/2021 2240   TCO2 20 (L) 10/22/2021 2240   ACIDBASEDEF 7.0 (H) 10/22/2021 2240   O2SAT  97 10/22/2021 2240   CBG (last 3)  Recent Labs    10/25/21 2001 10/26/21 0039 10/26/21 0418  GLUCAP 166* 136* 130*   EXAM Lungs: overall clear Cardiac: rr with extra systole Ext warm with small amt of edema  ASSESSMENT: POD #4 CAB, Mvrepair, Pulm vein isolation maze Will stop AAI and monitor rhythm. Will get EP to help on medical management Lasix this am then remove foley Cont coumadin encouragement   Coralie Common, MD '@DATE'$ @

## 2021-10-26 NOTE — Consult Note (Signed)
Cardiology Consultation   Patient ID: Jon [name] MRN: [id number]; DOB: 03/22/1945  Admit date: 10/13/2021 Date of Consult: 10/26/2021  PCP:  [name], MD   [place] Providers Cardiologist:  None   {   Patient Profile:   Jon [name] is a 76 y.o. male with a hx of w/PMHx of AFib, HTN, DM, OSA w/CPAP, former smoker who is being seen 10/26/2021 for the evaluation of arrhythmia, possible PPM need at the request of Dr. Lavonna Monarch.  History of Present Illness:   Jon [name] was admitted 10/13/21, progressive weakness, fatigue, initially HS Trop were only minimally elevated and he opted to leave AMA > on his way out became profoundly dizzy, brought to a room and Trops were up to 1100 >> 9868, EKG  without STEMI, but concern for inf/posterior MI.  TTE with preserved LVEF, cath with multivessel disease and failed attempt to intervene on RPLV and LAD, planned for CABG after Brlinita washout  10/22/21 CABG X 3 with the LIMA to the LAD and RSVG from the aorta to the PDA sequenced to the PLAT Radiofrequency Pulmonary vein isolation  maze ablation  Mitral valve repair with an Edwards 30 mm Carpentier-Adams ischemic ring annuloplasty  Closure of LA appendage  He is POD #4 today, remains with some pacing needs via his epicardial wires, with some AFib/flutter and on pressor support as well. 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 pylori infection 2011   Heart murmur    History of radiation therapy 09/20/10 thru 10/29/10   gastroesophageal junction/gastric cardia   Hypertension    Neuromuscular disorder ([place])    Other and unspecified hyperlipidemia    Sleep apnea    On CPAP   Status post chemotherapy 09/22/10 thru 10/27/10   concurrent w/radiation   Unspecified sleep apnea     Past Surgical History:  Procedure Laterality Date   CATARACT EXTRACTION     bilateral   COLONOSCOPY     CORONARY ARTERY BYPASS GRAFT N/A 10/22/2021   Procedure: CORONARY ARTERY BYPASS GRAFTING (CABG) X THREE, USING LEFT INTERNAL MAMMARY ARTERY AND RIGHT LEG GREATER SAPHENOUS VEIN HARVESTED ENDOSCOPICALLY;  Surgeon: Coralie Common, MD;  Location: [facility];  Service: Open Heart Surgery;  Laterality: N/A;   CORONARY BALLOON ANGIOPLASTY N/A 10/13/2021   Procedure: CORONARY BALLOON ANGIOPLASTY;  Surgeon: Sherren Mocha, MD;  Location: [facility];  Service: Cardiovascular;  Laterality: N/A;   CORONARY BALLOON ANGIOPLASTY N/A 10/15/2021   Procedure: CORONARY BALLOON ANGIOPLASTY;  Surgeon: [name], MD;  Location: [facility];  Service: Cardiovascular;  Laterality: N/A;   ELBOW BURSA SURGERY     LEFT HEART CATH AND CORONARY ANGIOGRAPHY N/A 10/13/2021   Procedure: LEFT HEART CATH AND CORONARY ANGIOGRAPHY;  Surgeon: Sherren Mocha, MD;  Location: [facility];  Service: Cardiovascular;  Laterality: N/A;   LEFT HEART CATH AND CORONARY ANGIOGRAPHY N/A 10/15/2021   Procedure: LEFT HEART CATH AND CORONARY ANGIOGRAPHY;  Surgeon: [name], MD;  Location: [facility];  Service: Cardiovascular;  Laterality: N/A;   LEG SURGERY  1980   right leg    MAZE N/A 10/22/2021   Procedure: MAZE;  Surgeon: Coralie Common, MD;  Location: [facility];  Service: Open Heart Surgery;  Laterality: N/A;   MITRAL VALVE REPAIR N/A 10/22/2021   Procedure: MITRAL VALVE REPAIR (MVR) USING  MCCARTHY-ADAMS RING SIZE 30MM;  Surgeon: Coralie Common, MD;  Location: [facility];  Service: Open Heart Surgery;  Laterality: N/A;   SKIN BIOPSY  08/22/11   shave biopsy =benign   TEE WITHOUT CARDIOVERSION N/A 10/22/2021   Procedure: TRANSESOPHAGEAL ECHOCARDIOGRAM (TEE);  Surgeon: Coralie Common, MD;  Location: [facility];  Service: Open Heart Surgery;  Laterality: N/A;   TONSILLECTOMY       Home Medications:  Prior to Admission medications   Medication Sig Start Date End Date Taking? Authorizing Provider  acetaminophen (TYLENOL) 500 MG tablet Take 500-1,000 mg by mouth daily as needed for headache or mild pain.   Yes [provider]  aspirin 325 MG EC tablet Take 325 mg by mouth daily. 05/10/12  Yes [provider]  Cholecalciferol (VITAMIN D-3 PO) Take 1 capsule by mouth daily.   Yes [provider]  EPINEPHrine 0.3 mg/0.3 mL IJ SOAJ injection Inject 0.3 mg into the muscle as needed for anaphylaxis. 08/22/21  Yes White, Adrienne R, NP  fenofibrate micronized (LOFIBRA) 134 MG capsule TAKE 1 CAPSULE BY MOUTH DAILY BEFORE AND BREAKFAST Patient taking differently: Take 134 mg by mouth daily before breakfast. 02/08/21  Yes Plotnikov, Evie Lacks, MD  metFORMIN (GLUCOPHAGE) 1000 MG tablet TAKE 1 TABLET(1000 MG) BY MOUTH TWICE DAILY WITH A MEAL Patient taking differently: Take 1,000 mg by mouth 2 (two) times daily with a meal. 08/04/21  Yes Plotnikov, Evie Lacks, MD  Multiple Vitamin (MULTIVITAMIN WITH MINERALS) TABS tablet Take 1 tablet by mouth daily.   Yes [provider]  omeprazole (PRILOSEC) 40 MG capsule TAKE 1 CAPSULE BY MOUTH DAILY Patient taking differently: Take 40 mg by mouth daily. 02/08/21  Yes Plotnikov, Evie Lacks, MD  nateglinide (STARLIX) 120 MG tablet TAKE 1 TABLET(120 MG) BY MOUTH THREE TIMES DAILY WITH MEALS 10/25/21   Plotnikov, Evie Lacks, MD    Inpatient Medications: Scheduled Meds:  acetaminophen  1,000 mg Oral Q6H   Or   acetaminophen (TYLENOL) oral liquid  160 mg/5 mL  1,000 mg Per Tube Q6H   aspirin EC  81 mg Oral Daily   atorvastatin  80 mg Oral Daily   bisacodyl  10 mg Oral Daily   Or   bisacodyl  10 mg Rectal Daily   Chlorhexidine Gluconate Cloth  6 each Topical Daily   docusate sodium  200 mg Oral Daily   insulin aspart  0-24 Units Subcutaneous Q4H   midodrine  15 mg Oral TID WC   pantoprazole  40 mg Oral Daily   sodium chloride flush  10-40 mL Intracatheter Q12H   sodium chloride flush  3 mL Intravenous Q12H   warfarin  5 mg Oral ONCE-1600   Warfarin - Pharmacist Dosing Inpatient   Does not apply q1600   Continuous Infusions:  sodium chloride     sodium chloride Stopped (10/24/21 0904)   sodium chloride Stopped (10/24/21 0700)   epinephrine Stopped (10/26/21 0840)  lactated ringers     lactated ringers Stopped (10/24/21 0858)   vasopressin 0.04 Units/min (10/26/21 1000)   PRN Meds: sodium chloride, dextrose, morphine injection, ondansetron (ZOFRAN) IV, mouth rinse, oxyCODONE, sodium chloride flush, sodium chloride flush, sodium chloride flush, traMADol  Allergies:   No Known Allergies  Social History:   Social History   Socioeconomic History   Marital status: Widowed    Spouse name: Not on file   Number of children: 2   Years of education: Not on file   Highest education level: Not on file  Occupational History   Occupation: retired    Fish farm manager: RETIRED  Tobacco Use   Smoking status: Former    Packs/day: 1.00    Years: 40.00    Total pack years: 40.00    Types: Cigarettes    Quit date: 01/31/2005    Years since quitting: 16.7   Smokeless tobacco: Never  Substance and Sexual Activity   Alcohol use: Yes    Alcohol/week: 0.0 standard drinks of alcohol    Comment: 2 drinks last 6 months per pt   Drug use: No   Sexual activity: Yes  Other Topics Concern   Not on file  Social History Narrative   Not on file   Social Determinants of Health   Financial Resource Strain: Low Risk  (08/31/2021)   Overall  Financial Resource Strain (CARDIA)    Difficulty of Paying Living Expenses: Not hard at all  Food Insecurity: No Food Insecurity (10/14/2021)   Hunger Vital Sign    Worried About Running Out of Food in the Last Year: Never true    Ran Out of Food in the Last Year: Never true  Transportation Needs: No Transportation Needs (10/14/2021)   PRAPARE - Hydrologist (Medical): No    Lack of Transportation (Non-Medical): No  Physical Activity: Sufficiently Active (08/31/2021)   Exercise Vital Sign    Days of Exercise per Week: 3 days    Minutes of Exercise per Session: 60 min  Stress: No Stress Concern Present (08/31/2021)   [place]    Feeling of Stress : Not at all  Social Connections: Moderately Integrated (08/31/2021)   Social Connection and Isolation Panel [NHANES]    Frequency of Communication with Friends and Family: More than three times a week    Frequency of Social Gatherings with Friends and Family: Once a week    Attends Religious Services: 1 to 4 times per year    Active Member of [employer] or Organizations: Yes    Attends Archivist Meetings: 1 to 4 times per year    Marital Status: Widowed  Intimate Partner Violence: Not At Risk (10/14/2021)   Humiliation, Afraid, Rape, and Kick questionnaire    Fear of Current or Ex-Partner: No    Emotionally Abused: No    Physically Abused: No    Sexually Abused: No    Family History:   Family History  Problem Relation Age of Onset   Arthritis Mother    Diabetes Father    Diabetes Brother    Coronary artery disease Neg Hx    Colon cancer Neg Hx    Esophageal cancer Neg Hx    Rectal cancer Neg Hx    Stomach cancer Neg Hx      ROS:  Please see the history of present illness.  All other ROS reviewed and negative.     Physical Exam/Data:   Vitals:  10/26/21 0800 10/26/21 0805 10/26/21 0900 10/26/21 1132  BP: (!) 87/74  (!) 77/64    Pulse:      Resp: 14  18   Temp:  98.7 F (37.1 C)  98.4 F (36.9 C)  TempSrc:  Oral  Oral  SpO2:      Weight:      Height:        Intake/Output Summary (Last 24 hours) at 10/26/2021 1232 Last data filed at 10/26/2021 1001 Gross per 24 hour  Intake 431.08 ml  Output 1000 ml  Net -568.92 ml      10/26/2021    4:47 AM 10/25/2021    5:00 AM 10/24/2021    5:00 AM  Last 3 Weights  Weight (lbs) 203 lb 11.3 oz 203 lb 0.7 oz 205 lb 4 oz   Weight (kg) 92.4 kg 92.1 kg 93.1 kg      Significant value     Body mass index is 29.23 kg/m.  OOB to chair, somewhat chronically ill appearing General:  Well nourished, well developed, in no acute distress HEENT: normal Neck: no JVD Vascular: No carotid bruits; Distal pulses 2+ bilaterally Cardiac:  RRR; no murmurs, gallops or rubs Lungs:  diminished at the bases, no wheezing, rhonchi or rales  Abd: soft, nontender  Ext: trace edema Musculoskeletal:  No deformities Skin: warm and dry  Neuro: no focal abnormalities noted Psych:  Normal affect   EKG:  The EKG was personally reviewed and demonstrates:    POD #1 10/23/21, #1 AV paced and #2 is perhaps CHB 61bpm, RBBB  SBB 55bpm, 1st degree AVb lock 280m, narrow QRS  Telemetry:  Telemetry was personally reviewed and demonstrates:   SR with 1st degree AVblock 60's currently, since pacing was reduced this AM, he has had infrequent fused and V paced beats  Relevant CV Studies:  10/25/21: TTE 1. Left ventricular ejection fraction, by estimation, is 60 to 65%. Left  ventricular ejection fraction by PLAX is 60 %. The left ventricle has  normal function. The left ventricle has no regional wall motion  abnormalities. Left ventricular diastolic  function could not be evaluated.   2. Right ventricular systolic function is moderately reduced. The right  ventricular size is mildly enlarged.   3. The mitral valve has been repaired/replaced. Trivial mitral valve  regurgitation. No evidence of  mitral stenosis. The mean mitral valve  gradient is 4.0 mmHg. There is a 30 mm Carpentier-Adams prosthetic  annuloplasty ring present in the mitral  position. Procedure Date: 10/22/2021.   4. A small pericardial effusion is present. The pericardial effusion is  circumferential. There is no evidence of cardiac tamponade.   5. The aortic valve is tricuspid. There is severe calcifcation of the  aortic valve. There is mild thickening of the aortic valve. Aortic valve  regurgitation is not visualized. Mild aortic valve stenosis. Aortic valve  area, by VTI measures 1.92 cm.  Aortic valve mean gradient measures 6.0 mmHg.   Comparison(s): Changes from prior study are noted. 10/22/2021: LVEF 55%,  calcified trileaflet aortic valve, mitral valve repair.    10/22/2021: inter-op TEE Complications: No known complications during this procedure.  POST-OP IMPRESSIONS  _ Left Ventricle: has normal systolic function, with an ejection fraction  of  55%. The cavity size was normal. The wall motion is abnormal with regional  variation. Inferior wall severe hypokinesis is comparable to pre-bypass  function.  _ Right Ventricle: The right ventricle appears unchanged from pre-bypass.  _ Aorta: The aorta  appears unchanged from pre-bypass.  _ Left Atrial Appendage: Has been surgically closed and the repair appears  to be  oversewn.  _ Aortic Valve: The aortic valve appears unchanged from pre-bypass. No  stenosis  present.  _ Mitral Valve: No regurgitation post repair. The gradient recorded across  the  prosthetic valve is within the expected range.Mitral valve repair with an  Edwards 30 mm Carpentier-Adams ischemic ring annuloplasty. Mean pressure  gradient by pulse wave doppler was 88mHg.. The mitral valve is status post  repair with an annuloplasty ring.  _ Tricuspid Valve: There is mild regurgitation.  _ Pulmonic Valve: The pulmonic valve appears unchanged from pre-bypass.  _ Interatrial Septum: The  interatrial septum appears unchanged from  pre-bypass.  _ Pericardium: The pericardium appears unchanged from pre-bypass.  _ Comments: Post-bypass images reviewed with surgeon.   PRE-OP FINDINGS   Left Ventricle: The left ventricle has normal systolic function, with an  ejection fraction of 60-65%. The cavity size was normal. There is mild  concentric left ventricular hypertrophy. Inferior and inferolateral severe  hypokinesis.    Right Ventricle: The right ventricle has normal systolic function. The  cavity was normal. There is no increase in right ventricular wall  thickness.   Left Atrium: Left atrial size was dilated. No left atrial/left atrial  appendage thrombus was detected. Left atrial appendage velocity is reduced  at less than 40 cm/s.   Right Atrium: Right atrial size was normal in size.   Interatrial Septum: No atrial level shunt detected by color flow Doppler.   Pericardium: There is no evidence of pericardial effusion.   Mitral Valve: The mitral valve is normal in structure. Mitral valve  regurgitation is severe by color flow Doppler. There is mild thickening  and mild calcification present.   Tricuspid Valve: The tricuspid valve was normal in structure. Tricuspid  valve regurgitation is trivial by color flow Doppler.   Aortic Valve: The aortic valve is tricuspid Aortic valve regurgitation was  not visualized by color flow Doppler. There is no stenosis of the aortic  valve.  There is severe calcifcation present. AVA by VTI measures 2.25 cm2.   Pulmonic Valve: The pulmonic valve was normal in structure.  Pulmonic valve regurgitation is not visualized by color flow Doppler.    Aorta: The aortic root, aortic arch and ascending aorta are normal in size  and structure. There is evidence of plaque in the descending aorta; Grade  I, measuring 1-377min size.   Pulmonary Artery: The pulmonary artery is of normal size.   10/15/2021: LHC Left Anterior Descending   Prox LAD to Mid LAD lesion is 90% stenosed. The lesion is severely calcified.  Mid LAD lesion is 80% stenosed. The lesion is calcified.    Right Coronary Artery  There is mild diffuse disease throughout the vessel. The RCA is ectatic, calcified, and tortuous throughout with mild to moderate diffuse plaquing  Prox RCA lesion is 50% stenosed.    Right Posterior Descending Artery  RPDA lesion is 60% stenosed.    Right Posterior Atrioventricular Artery  RPAV lesion is 95% stenosed. The lesion is thrombotic. The lesion was previously treated .      10/15/21 limited TTE 1. The aortic valve is calcified. There is severe calcifcation of the  aortic valve. Aortic valve regurgitation is not visualized. Mild aortic  valve stenosis. Aortic valve area, by VTI measures 1.92 cm. Aortic valve  mean gradient measures 9.0 mmHg.  Aortic valve Vmax measures 1.89 m/s.  Conclusion(s)/Recommendation(s): Limited study to assess aortic valve  stenosis. Stenosis is mild visually, by gradients, by dimensionless index,  and by valve area.    10/13/21: TTE 1. Left ventricular ejection fraction, by estimation, is 60 to 65%. The  left ventricle has normal function. The left ventricle demonstrates  regional wall motion abnormalities with basal inferior and basal  inferolateral severe hypokinesis. There is mild  concentric left ventricular hypertrophy. Left ventricular diastolic  parameters are consistent with Grade II diastolic dysfunction  (pseudonormalization).   2. Right ventricular systolic function is normal. The right ventricular  size is normal. Tricuspid regurgitation signal is inadequate for assessing  PA pressure.   3. The mitral valve is normal in structure. Mild mitral valve  regurgitation. No evidence of mitral stenosis.   4. The aortic valve is tricuspid. There is severe calcification of the  aortic valve. Aortic valve regurgitation is not visualized. There appears  to be some degree of  aortic stenosis but the aortic valve was not fully  interrogated by doppler. Would  consider repeat limited echo with attention to aortic valve doppler  interrogation.   5. The inferior vena cava is normal in size with greater than 50%  respiratory variability, suggesting right atrial pressure of 3 mmHg.    Laboratory Data:  High Sensitivity Troponin:   Recent Labs  Lab 10/13/21 0714 10/13/21 0836 10/13/21 1150 10/13/21 1327 10/13/21 1854  TROPONINIHS 397* 1,101* 9,868* 9,340* 15,166*     Chemistry Recent Labs  Lab 10/23/21 0403 10/23/21 1532 10/24/21 0350 10/25/21 0332 10/26/21 0423  NA 138 134* 136 136 134*  K 4.9 5.6* 4.4 4.2 4.3  CL 107 104 104 102 101  CO2 21* 19* 21* 26 25  GLUCOSE 146* 199* 163* 117* 132*  BUN '14 11 11 13 21  '$ CREATININE 1.37* 1.34* 1.38* 1.41* 1.39*  CALCIUM 7.9* 7.9* 8.1* 8.4* 8.1*  MG 2.4 2.2  --   --  2.1  GFRNONAA 53* 55* 53* 52* 53*  ANIONGAP '10 11 11 8 8    '$ Recent Labs  Lab 10/21/21 1207  PROT 7.3  ALBUMIN 4.0  AST 30  ALT 29  ALKPHOS 49  BILITOT 0.8   Lipids No results for input(s): "CHOL", "TRIG", "HDL", "LABVLDL", "LDLCALC", "CHOLHDL" in the last 168 hours.  Hematology Recent Labs  Lab 10/24/21 0350 10/25/21 0332 10/26/21 0423  WBC 11.8* 11.5* 11.5*  RBC 3.32* 3.01* 2.88*  HGB 9.7* 8.9* 8.5*  HCT 28.8* 26.6* 26.0*  MCV 86.7 88.4 90.3  MCH 29.2 29.6 29.5  MCHC 33.7 33.5 32.7  RDW 15.5 15.5 15.5  PLT 275 260 318   Thyroid No results for input(s): "TSH", "FREET4" in the last 168 hours.  BNPNo results for input(s): "BNP", "PROBNP" in the last 168 hours.  DDimer No results for input(s): "DDIMER" in the last 168 hours.   Radiology/Studies:   DG CHEST PORT 1 VIEW Result Date: 10/26/2021 CLINICAL DATA:  Shortness of breath. EXAM: PORTABLE CHEST 1 VIEW COMPARISON:  October 25, 2021 FINDINGS: Postsurgical changes of CABG. Right IJ approach venous sheath overlies superior vena cava. Calcific atherosclerotic disease of  the aorta. Cardiomediastinal silhouette is normal. Mediastinal contours appear intact. There is no evidence of focal airspace consolidation, pleural effusion or pneumothorax. Low lung volumes with bibasilar atelectasis. Osseous structures are without acute abnormality. Soft tissues are grossly normal. IMPRESSION: Low lung volumes with bibasilar atelectasis. Electronically Signed   By: Fidela Salisbury M.D.   On: 10/26/2021 10:14  Assessment and Plan:   Post-op AFlutter (though known for him at baseline to have Afib Post op sinus bradycardia  POD #4 CABG X 3 with the LIMA to the LAD and RSVG from the aorta to the PDA sequenced to the PLAT Radiofrequency Pulmonary vein isolation  maze ablation  Mitral valve repair with an Edwards 30 mm Carpentier-Adams ischemic ring annuloplasty  Closure of LA appendage  Remains on some pressor support Follow clinically with back up pacing for now. EKG today, POD #1 he had a new RBBB, appears to be more narrow now He may need pacing Will need to see how he does off pressor and follow his HR/conduction   Avoid nodal blocking agents  Dr. Myles Gip has seen the patient this morning  Risk Assessment/Risk Scores:    For questions or updates, please contact [place] Please consult [email or web address] for contact info under    Signed, Baldwin Jamaica, PA-C  10/26/2021 12:32 PM

## 2021-10-26 NOTE — Progress Notes (Signed)
NAME:  Jon [name], MRN:  [id number], DOB:  1945-08-27, LOS: 51 ADMISSION DATE:  10/13/2021, CONSULTATION DATE:  10/22/21 REFERRING MD:  Jon [name] - [name], CHIEF COMPLAINT:  s/p  CABG   History of Present Illness:  76 yo M PMH HTN, pAFib not on AC, OSA, hx tobacco use, DM2 presented 9/13  chest pain + near syncope initially leaving the ED AMA but became too dizzy and SOB to elope. Admitted to [facility] with NSTEMI, cards consulted 9/13 and went for cath unsuccessful PCI RPLV. 9/15 unsuccessful PCI LAD. [name] consult 9/15 for operative evaluation.   Underwent CABG x 3, MAZE, MVR 9/22 and transferred to ICU after case   PCCM is consulted for help with medical management  Pertinent  Medical History  Afib OSA on CPAP Tobacco use HTN HLD  CAD Mitral insufficiency   Significant Hospital Events: Including procedures, antibiotic start and stop dates in addition to other pertinent events   9/13 admit to [facility] w inferior STEMI. Cath lab with incomplete PCI RPLV 9/15 unsuccessful PCI LAD. [name] consult 9/22 CABG x 3, MAZE, MVR  9/25 No acute issues overnight, remains on low dose Epi drip   9/26 remains on both low-dose epi and vaso drips, repeat echocardiogram as below  Interim History / Subjective:  Seen sitting up in bedside recliner with no acute complaints this morning.  Denies any chest pain lightheadedness, or dizziness  Objective   Blood pressure (!) 87/74, pulse 99, temperature 100 F (37.8 C), temperature source Bladder, resp. rate 14, height '5\' 10"'$  (1.778 m), weight 92.4 kg, SpO2 94 %.        Intake/Output Summary (Last 24 hours) at 10/26/2021 0805 Last data filed at 10/26/2021 0600 Gross per 24 hour  Intake 407.7 ml  Output 880 ml  Net -472.3 ml    Filed Weights   10/24/21 0500 10/25/21 0500 10/26/21 0447  Weight: (S) 93.1 kg 92.1 kg 92.4 kg    Examination: General: Acute on chronic ill-appearing elderly gentleman sitting up in bedside recliner in no acute distress HEENT:  Jon [name]/AT, MM pink/moist, PERRL,  Neuro: Alert and oriented x3, nonfocal CV: s1s2 regular rate and rhythm, no murmur, rubs, or gallops,  PULM: Auscultation bilaterally, no increased work of breathing, no added breath sounds GI: soft, bowel sounds active in all 4 quadrants, non-tender, non-distended, tolerating oral diet Extremities: warm/dry, no edema  Skin: no rashes or lesions  Resolved Hospital Problem list     Assessment & Plan:  Acute inferior STEMI CAD s/p CABG x3 Mitral insufficiency s/p MVR pAfib s/p MAZE  Shock  -Cardiogenic (most likely) +/- vasoplegia -Echocardiogram 9/25 EF 60 to 65% with moderately reduced RV systolic function and small Pericardial effusion with tamponade P: Primary management per TCTS Continue aspirin and statin Unfortunately continues to require low-dose pressors including epi and vaso Increase midodrine to '15mg'$  TID Further diuretics per primary Continuous telemetry Optimize electrolytes  Acute respiratory failure with hypoxia - improved  Pulmonary edema P: Continue supplemental oxygen with SPO2 goal greater than 92% Wean supplemental oxygen as able Frequent and adequate pulmonary hygiene Mobilize as able  Expected post op ABLA -In setting of cardiac surgeries above P: Continue to trend CBC Transfuse per protocol Hemoglobin goal greater than 7  AKI, likely contrast-induced -Serum creatinine remained stable, baseline is around 0.9 P: Follow renal function  Monitor urine output Trend Bmet Avoid nephrotoxins Ensure adequate renal perfusion   Dm2, controlled P: Continue SSI as needed  CBG goal 140-180 Best Practice (  right click and "Reselect all SmartList Selections" daily)   Diet/type: Consistent carbohydrate diet DVT prophylaxis: not indicated GI prophylaxis: PPI Lines: Central line, Arterial Line, and yes and it is still needed Foley:  Yes, and it is still needed Code Status:  full code Last date of multidisciplinary goals of  care discussion: Continue to update patient and family daily  Critical care: 37 mins  Jon [name] D. Jon Kingfisher, NP-C [facility] Personal contact information can be found on Amion  10/26/2021, 8:05 AM

## 2021-10-26 NOTE — Progress Notes (Signed)
EVENING ROUNDS NOTE :     [address]       [place] [address]             [phone]                 4 Days Post-Op Procedure(s) (LRB): CORONARY ARTERY BYPASS GRAFTING (CABG) X THREE, USING LEFT INTERNAL MAMMARY ARTERY AND RIGHT LEG GREATER SAPHENOUS VEIN HARVESTED ENDOSCOPICALLY (N/A) MAZE (N/A) TRANSESOPHAGEAL ECHOCARDIOGRAM (TEE) (N/A) MITRAL VALVE REPAIR (MVR) USING MCCARTHY-ADAMS RING SIZE 30MM (N/A)   Total Length of Stay:  LOS: 13 days  Events:   No events Stable day    BP (!) 75/59   Pulse 61   Temp 98.2 F (36.8 C) (Oral)   Resp 17   Ht '5\' 10"'$  (1.778 m)   Wt 92.4 kg   SpO2 96%   BMI 29.23 kg/m          sodium chloride     sodium chloride Stopped (10/24/21 0904)   sodium chloride 10 mL/hr at 10/26/21 1800   epinephrine Stopped (10/26/21 0840)   lactated ringers     lactated ringers Stopped (10/24/21 0858)   vasopressin Stopped (10/26/21 1430)    I/O last 3 completed shifts: In: 892 [P.O.:320; I.V.:486] Out: 1355 [Urine:1355]      Latest Ref Rng & Units 10/26/2021    4:23 AM 10/25/2021    3:32 AM 10/24/2021    3:50 AM  CBC  WBC 4.0 - 10.5 K/uL 11.5  11.5  11.8   Hemoglobin 13.0 - 17.0 g/dL 8.5  8.9  9.7   Hematocrit 39.0 - 52.0 % 26.0  26.6  28.8   Platelets 150 - 400 K/uL 318  260  275        Latest Ref Rng & Units 10/26/2021    4:23 AM 10/25/2021    3:32 AM 10/24/2021    3:50 AM  BMP  Glucose 70 - 99 mg/dL 132  117  163   BUN 8 - 23 mg/dL '21  13  11   '$ Creatinine 0.61 - 1.24 mg/dL 1.39  1.41  1.38   Sodium 135 - 145 mmol/L 134  136  136   Potassium 3.5 - 5.1 mmol/L 4.3  4.2  4.4   Chloride 98 - 111 mmol/L 101  102  104   CO2 22 - 32 mmol/L '25  26  21   '$ Calcium 8.9 - 10.3 mg/dL 8.1  8.4  8.1     ABG    Component Value Date/Time   PHART 7.293 (L) 10/22/2021 2240   PCO2ART 38.4 10/22/2021 2240   PO2ART 102 10/22/2021 2240   HCO3 18.4 (L) 10/22/2021 2240   TCO2 20 (L) 10/22/2021 2240   ACIDBASEDEF 7.0 (H)  10/22/2021 2240   O2SAT 97 10/22/2021 [place], MD 10/26/2021 7:00 PM

## 2021-10-27 DIAGNOSIS — R9431 Abnormal electrocardiogram [ECG] [EKG]: Secondary | ICD-10-CM | POA: Diagnosis not present

## 2021-10-27 LAB — BASIC METABOLIC PANEL
Anion gap: 10 (ref 5–15)
BUN: 27 mg/dL — ABNORMAL HIGH (ref 8–23)
CO2: 26 mmol/L (ref 22–32)
Calcium: 8.3 mg/dL — ABNORMAL LOW (ref 8.9–10.3)
Chloride: 100 mmol/L (ref 98–111)
Creatinine, Ser: 1.49 mg/dL — ABNORMAL HIGH (ref 0.61–1.24)
GFR, Estimated: 48 mL/min — ABNORMAL LOW (ref >60)
Glucose, Bld: 92 mg/dL (ref 70–99)
Potassium: 3.9 mmol/L (ref 3.5–5.1)
Sodium: 136 mmol/L (ref 135–145)

## 2021-10-27 LAB — CBC
HCT: 24.4 % — ABNORMAL LOW (ref 39.0–52.0)
Hemoglobin: 8.2 g/dL — ABNORMAL LOW (ref 13.0–17.0)
MCH: 29.4 pg (ref 26.0–34.0)
MCHC: 33.6 g/dL (ref 30.0–36.0)
MCV: 87.5 fL (ref 80.0–100.0)
Platelets: 350 10*3/uL (ref 150–400)
RBC: 2.79 MIL/uL — ABNORMAL LOW (ref 4.22–5.81)
RDW: 15.2 % (ref 11.5–15.5)
WBC: 10.5 10*3/uL (ref 4.0–10.5)
nRBC: 2 % — ABNORMAL HIGH (ref 0.0–0.2)

## 2021-10-27 LAB — PROTIME-INR
INR: 1.8 — ABNORMAL HIGH (ref 0.8–1.2)
Prothrombin Time: 20.3 seconds — ABNORMAL HIGH (ref 11.4–15.2)

## 2021-10-27 LAB — GLUCOSE, CAPILLARY
Glucose-Capillary: 155 mg/dL — ABNORMAL HIGH (ref 70–99)
Glucose-Capillary: 203 mg/dL — ABNORMAL HIGH (ref 70–99)
Glucose-Capillary: 81 mg/dL (ref 70–99)
Glucose-Capillary: 83 mg/dL (ref 70–99)
Glucose-Capillary: 93 mg/dL (ref 70–99)
Glucose-Capillary: 98 mg/dL (ref 70–99)

## 2021-10-27 MED ORDER — WARFARIN SODIUM 2.5 MG PO TABS
2.5000 mg | ORAL_TABLET | Freq: Once | ORAL | Status: AC
  Administered 2021-10-27: 2.5 mg via ORAL
  Filled 2021-10-27: qty 1

## 2021-10-27 MED ORDER — CHLORPROMAZINE HCL 25 MG PO TABS
25.0000 mg | ORAL_TABLET | Freq: Three times a day (TID) | ORAL | Status: DC | PRN
  Administered 2021-10-27: 25 mg via ORAL
  Filled 2021-10-27 (×2): qty 1

## 2021-10-27 MED ORDER — WARFARIN - PHARMACIST DOSING INPATIENT: Freq: Every day | Status: DC

## 2021-10-27 MED ORDER — CHLORPROMAZINE HCL 25 MG PO TABS
25.0000 mg | ORAL_TABLET | Freq: Four times a day (QID) | ORAL | Status: DC | PRN
  Filled 2021-10-27: qty 1

## 2021-10-27 MED ORDER — SODIUM CHLORIDE 0.9 % IV SOLN: Freq: Once | INTRAVENOUS | Status: AC

## 2021-10-27 MED ORDER — INSULIN ASPART 100 UNIT/ML IJ SOLN
0.0000 [IU] | Freq: Three times a day (TID) | INTRAMUSCULAR | Status: DC
  Administered 2021-10-28: 2 [IU] via SUBCUTANEOUS

## 2021-10-27 MED ORDER — MELATONIN 3 MG PO TABS
3.0000 mg | ORAL_TABLET | Freq: Every day | ORAL | Status: DC
  Administered 2021-10-27 – 2021-11-01 (×6): 3 mg via ORAL
  Filled 2021-10-27 (×6): qty 1

## 2021-10-27 MED ORDER — INSULIN ASPART 100 UNIT/ML IJ SOLN: 0.0000 [IU] | Freq: Three times a day (TID) | INTRAMUSCULAR | Status: DC

## 2021-10-27 MED ORDER — METOCLOPRAMIDE HCL 5 MG/ML IJ SOLN
10.0000 mg | Freq: Four times a day (QID) | INTRAMUSCULAR | Status: AC
  Administered 2021-10-27 – 2021-10-28 (×4): 10 mg via INTRAVENOUS
  Filled 2021-10-27 (×3): qty 2

## 2021-10-27 MED FILL — Heparin Sodium (Porcine) Inj 1000 Unit/ML: INTRAMUSCULAR | Qty: 30 | Status: AC

## 2021-10-27 MED FILL — Mannitol IV Soln 20%: INTRAVENOUS | Qty: 500 | Status: AC

## 2021-10-27 MED FILL — Lidocaine HCl Local Preservative Free (PF) Inj 2%: INTRAMUSCULAR | Qty: 14 | Status: AC

## 2021-10-27 MED FILL — Calcium Chloride Inj 10%: INTRAVENOUS | Qty: 10 | Status: AC

## 2021-10-27 MED FILL — Sodium Bicarbonate IV Soln 8.4%: INTRAVENOUS | Qty: 50 | Status: AC

## 2021-10-27 MED FILL — Electrolyte-R (PH 7.4) Solution: INTRAVENOUS | Qty: 7000 | Status: AC

## 2021-10-27 MED FILL — Sodium Chloride IV Soln 0.9%: INTRAVENOUS | Qty: 2000 | Status: AC

## 2021-10-27 MED FILL — Potassium Chloride Inj 2 mEq/ML: INTRAVENOUS | Qty: 40 | Status: AC

## 2021-10-27 MED FILL — Heparin Sodium (Porcine) Inj 1000 Unit/ML: Qty: 1000 | Status: AC

## 2021-10-27 MED FILL — Heparin Sodium (Porcine) Inj 1000 Unit/ML: INTRAMUSCULAR | Qty: 20 | Status: AC

## 2021-10-27 NOTE — Progress Notes (Signed)
Rounding Note    Patient Name: Jon [name] Date of Encounter: 10/27/2021  [facility] Cardiologist: None   Subjective   OOB to chair, getting ready to walk, no CP,, generally weak and tired, no SOB  Inpatient Medications    Scheduled Meds:  acetaminophen  1,000 mg Oral Q6H   Or   acetaminophen (TYLENOL) oral liquid 160 mg/5 mL  1,000 mg Per Tube Q6H   aspirin EC  81 mg Oral Daily   atorvastatin  80 mg Oral Daily   bisacodyl  10 mg Oral Daily   Or   bisacodyl  10 mg Rectal Daily   Chlorhexidine Gluconate Cloth  6 each Topical Daily   docusate sodium  200 mg Oral Daily   insulin aspart  0-24 Units Subcutaneous TID WC   melatonin  3 mg Oral QHS   midodrine  15 mg Oral TID WC   pantoprazole  40 mg Oral Daily   sodium chloride flush  10-40 mL Intracatheter Q12H   sodium chloride flush  3 mL Intravenous Q12H   warfarin  2.5 mg Oral ONCE-1600   Warfarin - Pharmacist Dosing Inpatient   Does not apply q1600   Continuous Infusions:  sodium chloride     sodium chloride Stopped (10/24/21 0904)   sodium chloride 20 mL/hr at 10/27/21 0100   sodium chloride     epinephrine Stopped (10/26/21 0840)   lactated ringers     lactated ringers Stopped (10/24/21 0858)   vasopressin Stopped (10/26/21 1430)   PRN Meds: sodium chloride, chlorproMAZINE, dextrose, morphine injection, ondansetron (ZOFRAN) IV, mouth rinse, oxyCODONE, sodium chloride flush, sodium chloride flush, sodium chloride flush, traMADol   Vital Signs    Vitals:   10/27/21 0700 10/27/21 0715 10/27/21 0730 10/27/21 0830  BP: (!) 140/78     Pulse: (!) 59 81 72   Resp: 20 20 (!) 21   Temp:    98.5 F (36.9 C)  TempSrc:    Oral  SpO2: (!) 81% 95% 90%   Weight:      Height:        Intake/Output Summary (Last 24 hours) at 10/27/2021 0902 Last data filed at 10/27/2021 0200 Gross per 24 hour  Intake 838.09 ml  Output 825 ml  Net 13.09 ml      10/26/2021    4:47 AM 10/25/2021    5:00 AM  10/24/2021    5:00 AM  Last 3 Weights  Weight (lbs) 203 lb 11.3 oz 203 lb 0.7 oz 205 lb 4 oz   Weight (kg) 92.4 kg 92.1 kg 93.1 kg      Significant value      Telemetry    SR,PACs, no AFlutter, occ PVCs, occasional fused/pacing beat, infrequent paced beat - Personally Reviewed  ECG    SR 80bpm, 1st degree AVblock 232m, QRS has narrowed to 1041m still with a RBBB morphology - Personally Reviewed  Physical Exam   GEN: No acute distress.   Neck: No JVD Cardiac: RRR, no murmurs, rubs, or gallops.  Respiratory: slightly diminished at the bases. GI: Soft, nontender, non-distended  MS: No edema; No deformity. Neuro:  Nonfocal  Psych: Normal affect   Labs    High Sensitivity Troponin:   Recent Labs  Lab 10/13/21 0714 10/13/21 0836 10/13/21 1150 10/13/21 1327 10/13/21 1854  TROPONINIHS 397* 1,101* 9,868* 9,340* 15,166*     Chemistry Recent Labs  Lab 10/21/21 1207 10/22/21 [id number]/23 0403 10/23/21 1532 10/24/21 0350 10/25/21 [id number]/23 0423  10/27/21 0459  NA 138   < > 138 134*   < > 136 134* 136  K 4.0   < > 4.9 5.6*   < > 4.2 4.3 3.9  CL 105   < > 107 104   < > 102 101 100  CO2 27   < > 21* 19*   < > '26 25 26  '$ GLUCOSE 109*   < > 146* 199*   < > 117* 132* 92  BUN 16   < > 14 11   < > 13 21 27*  CREATININE 1.41*   < > 1.37* 1.34*   < > 1.41* 1.39* 1.49*  CALCIUM 9.8   < > 7.9* 7.9*   < > 8.4* 8.1* 8.3*  MG  --    < > 2.4 2.2  --   --  2.1  --   PROT 7.3  --   --   --   --   --   --   --   ALBUMIN 4.0  --   --   --   --   --   --   --   AST 30  --   --   --   --   --   --   --   ALT 29  --   --   --   --   --   --   --   ALKPHOS 49  --   --   --   --   --   --   --   BILITOT 0.8  --   --   --   --   --   --   --   GFRNONAA 52*   < > 53* 55*   < > 52* 53* 48*  ANIONGAP 6   < > 10 11   < > '8 8 10   '$ < > = values in this interval not displayed.    Lipids No results for input(s): "CHOL", "TRIG", "HDL", "LABVLDL", "LDLCALC", "CHOLHDL" in the last 168 hours.   Hematology Recent Labs  Lab 10/25/21 0332 10/26/21 0423 10/27/21 0459  WBC 11.5* 11.5* 10.5  RBC 3.01* 2.88* 2.79*  HGB 8.9* 8.5* 8.2*  HCT 26.6* 26.0* 24.4*  MCV 88.4 90.3 87.5  MCH 29.6 29.5 29.4  MCHC 33.5 32.7 33.6  RDW 15.5 15.5 15.2  PLT 260 318 350   Thyroid No results for input(s): "TSH", "FREET4" in the last 168 hours.  BNPNo results for input(s): "BNP", "PROBNP" in the last 168 hours.  DDimer No results for input(s): "DDIMER" in the last 168 hours.   Radiology     Cardiac Studies   10/25/21: TTE 1. Left ventricular ejection fraction, by estimation, is 60 to 65%. Left  ventricular ejection fraction by PLAX is 60 %. The left ventricle has  normal function. The left ventricle has no regional wall motion  abnormalities. Left ventricular diastolic  function could not be evaluated.   2. Right ventricular systolic function is moderately reduced. The right  ventricular size is mildly enlarged.   3. The mitral valve has been repaired/replaced. Trivial mitral valve  regurgitation. No evidence of mitral stenosis. The mean mitral valve  gradient is 4.0 mmHg. There is a 30 mm Carpentier-Adams prosthetic  annuloplasty ring present in the mitral  position. Procedure Date: 10/22/2021.   4. A small pericardial effusion is present. The pericardial effusion is  circumferential. There is no evidence of cardiac tamponade.  5. The aortic valve is tricuspid. There is severe calcifcation of the  aortic valve. There is mild thickening of the aortic valve. Aortic valve  regurgitation is not visualized. Mild aortic valve stenosis. Aortic valve  area, by VTI measures 1.92 cm.  Aortic valve mean gradient measures 6.0 mmHg.   Comparison(s): Changes from prior study are noted. 10/22/2021: LVEF 55%,  calcified trileaflet aortic valve, mitral valve repair.      10/22/2021: inter-op TEE Complications: No known complications during this procedure.  POST-OP IMPRESSIONS  _ Left Ventricle:  has normal systolic function, with an ejection fraction  of  55%. The cavity size was normal. The wall motion is abnormal with regional  variation. Inferior wall severe hypokinesis is comparable to pre-bypass  function.  _ Right Ventricle: The right ventricle appears unchanged from pre-bypass.  _ Aorta: The aorta appears unchanged from pre-bypass.  _ Left Atrial Appendage: Has been surgically closed and the repair appears  to be  oversewn.  _ Aortic Valve: The aortic valve appears unchanged from pre-bypass. No  stenosis  present.  _ Mitral Valve: No regurgitation post repair. The gradient recorded across  the  prosthetic valve is within the expected range.Mitral valve repair with an  Edwards 30 mm Carpentier-Adams ischemic ring annuloplasty. Mean pressure  gradient by pulse wave doppler was 7mHg.. The mitral valve is status post  repair with an annuloplasty ring.  _ Tricuspid Valve: There is mild regurgitation.  _ Pulmonic Valve: The pulmonic valve appears unchanged from pre-bypass.  _ Interatrial Septum: The interatrial septum appears unchanged from  pre-bypass.  _ Pericardium: The pericardium appears unchanged from pre-bypass.  _ Comments: Post-bypass images reviewed with surgeon.  PRE-OP FINDINGS   Left Ventricle: The left ventricle has normal systolic function, with an  ejection fraction of 60-65%. The cavity size was normal. There is mild  concentric left ventricular hypertrophy. Inferior and inferolateral severe  hypokinesis.    Right Ventricle: The right ventricle has normal systolic function. The  cavity was normal. There is no increase in right ventricular wall  thickness.   Left Atrium: Left atrial size was dilated. No left atrial/left atrial  appendage thrombus was detected. Left atrial appendage velocity is reduced  at less than 40 cm/s.   Right Atrium: Right atrial size was normal in size.   Interatrial Septum: No atrial level shunt detected by color flow  Doppler.   Pericardium: There is no evidence of pericardial effusion.   Mitral Valve: The mitral valve is normal in structure. Mitral valve  regurgitation is severe by color flow Doppler. There is mild thickening  and mild calcification present.   Tricuspid Valve: The tricuspid valve was normal in structure. Tricuspid  valve regurgitation is trivial by color flow Doppler.   Aortic Valve: The aortic valve is tricuspid Aortic valve regurgitation was  not visualized by color flow Doppler. There is no stenosis of the aortic  valve.  There is severe calcifcation present. AVA by VTI measures 2.25 cm2.   Pulmonic Valve: The pulmonic valve was normal in structure.  Pulmonic valve regurgitation is not visualized by color flow Doppler.    Aorta: The aortic root, aortic arch and ascending aorta are normal in size  and structure. There is evidence of plaque in the descending aorta; Grade  I, measuring 1-378min size.   Pulmonary Artery: The pulmonary artery is of normal size.    10/15/2021: LHC Left Anterior Descending  Prox LAD to Mid LAD lesion is 90% stenosed. The lesion  is severely calcified.  Mid LAD lesion is 80% stenosed. The lesion is calcified.    Right Coronary Artery  There is mild diffuse disease throughout the vessel. The RCA is ectatic, calcified, and tortuous throughout with mild to moderate diffuse plaquing  Prox RCA lesion is 50% stenosed.    Right Posterior Descending Artery  RPDA lesion is 60% stenosed.    Right Posterior Atrioventricular Artery  RPAV lesion is 95% stenosed. The lesion is thrombotic. The lesion was previously treated .        10/15/21 limited TTE 1. The aortic valve is calcified. There is severe calcifcation of the  aortic valve. Aortic valve regurgitation is not visualized. Mild aortic  valve stenosis. Aortic valve area, by VTI measures 1.92 cm. Aortic valve  mean gradient measures 9.0 mmHg.  Aortic valve Vmax measures 1.89 m/s.    Conclusion(s)/Recommendation(s): Limited study to assess aortic valve  stenosis. Stenosis is mild visually, by gradients, by dimensionless index,  and by valve area.      10/13/21: TTE 1. Left ventricular ejection fraction, by estimation, is 60 to 65%. The  left ventricle has normal function. The left ventricle demonstrates  regional wall motion abnormalities with basal inferior and basal  inferolateral severe hypokinesis. There is mild  concentric left ventricular hypertrophy. Left ventricular diastolic  parameters are consistent with Grade II diastolic dysfunction  (pseudonormalization).   2. Right ventricular systolic function is normal. The right ventricular  size is normal. Tricuspid regurgitation signal is inadequate for assessing  PA pressure.   3. The mitral valve is normal in structure. Mild mitral valve  regurgitation. No evidence of mitral stenosis.   4. The aortic valve is tricuspid. There is severe calcification of the  aortic valve. Aortic valve regurgitation is not visualized. There appears  to be some degree of aortic stenosis but the aortic valve was not fully  interrogated by doppler. Would  consider repeat limited echo with attention to aortic valve doppler  interrogation.   5. The inferior vena cava is normal in size with greater than 50%  respiratory variability, suggesting right atrial pressure of 3 mmHg.      Patient Profile     76 y.o. male  w/PMHx of AFib, HTN, DM, OSA w/CPAP, former smoker   admitted 10/13/21, progressive weakness, fatigue, initially HS Trop were only minimally elevated and he opted to leave AMA > on his way out became profoundly dizzy, brought to a room and Trops were up to 1100 >> 9868, EKG  without STEMI, but concern for inf/posterior MI.   TTE with preserved LVEF, cath with multivessel disease and failed attempt to intervene on RPLV and LAD, planned for CABG after Brlinita washout   10/22/21 CABG X 3 with the LIMA to the LAD and  RSVG from the aorta to the PDA sequenced to the PLAT Radiofrequency Pulmonary vein isolation  maze ablation  Mitral valve repair with an Edwards 30 mm Carpentier-Adams ischemic ring annuloplasty  Closure of LA appendage  He has had some PAFlutte (PAFib is known for him) and some pacing as well, EP called in POD #4  Assessment & Plan    Post-op AFlutter (though known for him at baseline to have Afib Post op sinus bradycardia POD #4 CABG X 3 with the LIMA to the LAD and RSVG from the aorta to the PDA sequenced to the PLAT Radiofrequency Pulmonary vein isolation  maze ablation  Mitral valve repair with an Edwards 30 mm Carpentier-Adams ischemic ring annuloplasty  Closure of LA appendage  Off pressor, on high dose midodrine  Dr. Myles Gip has seen the patient/reviewed telemetry Less/infrequent paced beats Threshold this AM on epicardial wires are <1 Pacing rate reduced to 40bpm  Continue to hold off nodal blocking agents Monitor further off pressor, and increased mobilization  D/w Dr. Lavonna Monarch, wondering about amiodarone for his PAF/PVCs. I think if no pacing support is needed today we could perhaps consider tomorrow   For questions or updates, please contact [place] Please consult [email or web address] for contact info under        Signed, Baldwin [place], PA-C  10/27/2021, 9:02 AM

## 2021-10-27 NOTE — TOC Progression Note (Signed)
Transition of Care [facility]) - Progression Note    Patient Details  Name: Jon [name] MRN: [id number] Date of Birth: [birth year]  Transition of Care [facility]) CM/SW Contact  Graves-Bigelow, Ocie Cornfield, RN Phone Number: 10/27/2021, 12:20 PM  Clinical Narrative:  Patient POD-5 CABG. Case Manager spoke with patient this morning regarding disposition needs for home. Patient feels that he is not 100%; however, wants to see how he progresses before arranging home health services. Patient states he has family and friend support. Patient has DME shower chair and rollator. Case Manager will continue to follow for additional transition of care needs.     Expected Discharge Plan: Home/Self Care Barriers to Discharge: Continued Medical Work up  Expected Discharge Plan and Services Expected Discharge Plan: Home/Self Care In-house Referral: NA Discharge Planning Services: CM Consult Post Acute Care Choice: NA Living arrangements for the past 2 months: Single Family Home                 DME Arranged: N/A DME Agency: NA       HH Arranged: NA     Readmission Risk Interventions    10/18/2021    4:34 PM  Readmission Risk Prevention Plan  Transportation Screening Complete  PCP or Specialist Appt within 3-5 Days Complete  HRI or Home Care Consult Complete  Social Work Consult for [place] Planning/Counseling Complete  Palliative Care Screening Not Applicable  Medication Review Press photographer) Referral to Pharmacy

## 2021-10-27 NOTE — Progress Notes (Signed)
     [address]       [place] [address]             [phone]       EVENING ROUNDS Still with hiccups, will increase to q 6hrs prn and add reglan for 4 doses Hemodynamics ok  Hopefully transfer to floor tomorrow

## 2021-10-27 NOTE — Progress Notes (Signed)
Physical Therapy Treatment Patient Details Name: DAIVON RAYOS MRN: [id number] DOB: 1945-09-16 Today's Date: 10/27/2021   History of Present Illness Pt is a 76 y/o male admitted 10/13/21 with CP and lightheadedness. Found to have A-fib with SSS, symptomatic bradycardia, STEMI s/p unsuccessful PCI 9/13. 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 Pt with post-op bradycardia. PMH includes A-fib, DM, glaucoma, HTN, OSA.   PT Comments    Pt progressing with mobility. Today's session focused on ambulation for improving strength and activity tolerance; pt requires minA to stand, able to increase ambulation distance with RW and min guard. Pt remains limited by generalized weakness, decreased activity tolerance, and impaired balance strategies/postural reactions. Will continue to follow acutely to address established goals.    Recommendations for follow up therapy are one component of a multi-disciplinary discharge planning process, led by the attending physician.  Recommendations may be updated based on patient status, additional functional criteria and insurance authorization.  Follow Up Recommendations  Home health PT     Assistance Recommended at Discharge Frequent or constant Supervision/Assistance  Patient can return home with the following A little help with walking and/or transfers;A little help with bathing/dressing/bathroom;Help with stairs or ramp for entrance;Assist for transportation;Assistance with cooking/housework   Equipment Recommendations  None recommended by PT    Recommendations for Other Services       Precautions / Restrictions Precautions Precautions: Fall;Sternal;Other (comment) Precaution Comments: external pacer     Mobility  Bed Mobility               General bed mobility comments: received sitting in recliner    Transfers Overall transfer level: Needs assistance Equipment used: Rolling walker (2 wheels) Transfers: Sit to/from Stand Sit to Stand: Min assist            General transfer comment: performed multiple attempts of sit<>stand from recliner, cues for hand placement (on knees) and use of momentum to power up, ultimately requiring minA for trunk elevation with each trial; cues for eccentric lowering to sit    Ambulation/Gait Ambulation/Gait assistance: Min guard Gait Distance (Feet): 210 Feet Assistive device: Rolling walker (2 wheels) Gait Pattern/deviations: Step-through pattern, Decreased stride length, Trunk flexed Gait velocity: decreased     General Gait Details: slow, fatigued gait with RW and intermittent min guard for balance; min cues to self-monitor activity pacing; pt declines further distance secondary to fatigue, "I feel like I have no energy or strength"   Stairs             Wheelchair Mobility    Modified Rankin (Stroke Patients Only)       Balance Overall balance assessment: Needs assistance Sitting-balance support: Feet supported, No upper extremity supported Sitting balance-Leahy Scale: Fair     Standing balance support: During functional activity, Reliant on assistive device for balance Standing balance-Leahy Scale: Poor                              Cognition Arousal/Alertness: Awake/alert Behavior During Therapy: Flat affect Overall Cognitive Status: Within Functional Limits for tasks assessed                                 General Comments: WFL for simple tasks, not formally assessed. Suspect flat affect more related to fatigue and discomfort        Exercises      General Comments General comments (skin  integrity, edema, etc.): HR 67; post-ambulation BP 91/57 via L upper arm, then 132/104 on L lower leg (elevated); RN aware      Pertinent Vitals/Pain Pain Assessment Pain Assessment: Faces Faces Pain Scale: Hurts a little bit Pain Location: sternum with hiccups Pain Descriptors / Indicators: Discomfort Pain Intervention(s): Monitored during session     Home Living                          Prior Function            PT Goals (current goals can now be found in the care plan section) Progress towards PT goals: Progressing toward goals    Frequency    Min 3X/week      PT Plan Current plan remains appropriate    Co-evaluation              AM-PAC PT "6 Clicks" Mobility   Outcome Measure  Help needed turning from your back to your side while in a flat bed without using bedrails?: A Little Help needed moving from lying on your back to sitting on the side of a flat bed without using bedrails?: A Little Help needed moving to and from a bed to a chair (including a wheelchair)?: A Little Help needed standing up from a chair using your arms (e.g., wheelchair or bedside chair)?: A Little Help needed to walk in hospital room?: A Little Help needed climbing 3-5 steps with a railing? : Total 6 Click Score: 16    End of Session Equipment Utilized During Treatment: Gait belt Activity Tolerance: Patient tolerated treatment well;Patient limited by fatigue Patient left: in chair;with call bell/phone within reach Nurse Communication: Mobility status PT Visit Diagnosis: Difficulty in walking, not elsewhere classified (R26.2);Muscle weakness (generalized) (M62.81)     Time: 2751-7001 PT Time Calculation (min) (ACUTE ONLY): 22 min  Charges:  $Therapeutic Exercise: 8-22 mins                     . Mabeline Caras, PT, DPT Acute Rehabilitation Services  Personal: [facility] Office: [place] 10/27/2021, 12:31 PM

## 2021-10-27 NOTE — Progress Notes (Signed)
[address]       [place] [address]             [phone]      5 Days Post-Op  Procedure(s) (LRB): CORONARY ARTERY BYPASS GRAFTING (CABG) X THREE, USING LEFT INTERNAL MAMMARY ARTERY AND RIGHT LEG GREATER SAPHENOUS VEIN HARVESTED ENDOSCOPICALLY (N/A) MAZE (N/A) TRANSESOPHAGEAL ECHOCARDIOGRAM (TEE) (N/A) MITRAL VALVE REPAIR (MVR) USING MCCARTHY-ADAMS RING SIZE 30MM (N/A)   Total Length of Stay:  LOS: 14 days    SUBJECTIVE: Still with hiccups Overall feeling better Walked some in halls  Vitals:   10/27/21 0715 10/27/21 0730  BP:    Pulse: 81 72  Resp: 20 (!) 21  Temp:    SpO2: 95% 90%    Intake/Output      09/26 0701 09/27 0700 09/27 0701 09/28 0700   P.O. 620    I.V. (mL/kg) 275.1 (3)    Total Intake(mL/kg) 895.1 (9.7)    Urine (mL/kg/hr) 895 (0.4)    Stool     Total Output 895    Net +0.1         Urine Occurrence 1 x        sodium chloride     sodium chloride Stopped (10/24/21 0904)   sodium chloride 20 mL/hr at 10/27/21 0100   epinephrine Stopped (10/26/21 0840)   lactated ringers     lactated ringers Stopped (10/24/21 0858)   vasopressin Stopped (10/26/21 1430)    CBC    Component Value Date/Time   WBC 10.5 10/27/2021 0459   RBC 2.79 (L) 10/27/2021 0459   HGB 8.2 (L) 10/27/2021 0459   HGB 15.8 03/25/2014 1049   HCT 24.4 (L) 10/27/2021 0459   HCT 48.6 03/25/2014 1049   PLT 350 10/27/2021 0459   PLT 332 03/25/2014 1049   MCV 87.5 10/27/2021 0459   MCV 85.9 03/25/2014 1049   MCH 29.4 10/27/2021 0459   MCHC 33.6 10/27/2021 0459   RDW 15.2 10/27/2021 0459   RDW 15.0 (H) 03/25/2014 1049   LYMPHSABS 3.8 10/13/2021 0510   LYMPHSABS 2.0 03/25/2014 1049   MONOABS 1.0 10/13/2021 0510   MONOABS 0.8 03/25/2014 1049   EOSABS 0.4 10/13/2021 0510   EOSABS 0.2 03/25/2014 1049   BASOSABS 0.1 10/13/2021 0510   BASOSABS 0.0 03/25/2014 1049   CMP     Component Value Date/Time   NA 136 10/27/2021 0459   K 3.9 10/27/2021  0459   CL 100 10/27/2021 0459   CO2 26 10/27/2021 0459   GLUCOSE 92 10/27/2021 0459   BUN 27 (H) 10/27/2021 0459   CREATININE 1.49 (H) 10/27/2021 0459   CREATININE 1.20 02/15/2011 0846   CALCIUM 8.3 (L) 10/27/2021 0459   PROT 7.3 10/21/2021 1207   ALBUMIN 4.0 10/21/2021 1207   AST 30 10/21/2021 1207   ALT 29 10/21/2021 1207   ALKPHOS 49 10/21/2021 1207   BILITOT 0.8 10/21/2021 1207   GFRNONAA 48 (L) 10/27/2021 0459   GFRAA 52 (L) 11/23/2015 2118   ABG    Component Value Date/Time   PHART 7.293 (L) 10/22/2021 2240   PCO2ART 38.4 10/22/2021 2240   PO2ART 102 10/22/2021 2240   HCO3 18.4 (L) 10/22/2021 2240   TCO2 20 (L) 10/22/2021 2240   ACIDBASEDEF 7.0 (H) 10/22/2021 2240   O2SAT 97 10/22/2021 2240   CBG (last 3)  Recent Labs    10/26/21 2336 10/27/21 0028 10/27/21 0343  GLUCAP 55* 93 98  EXAM: Lungs: clear Card: RR  Wounds: ok Ext: warm and dry    ASSESSMENT: Hemodynamics improved tolerating midodrine Will give fluid bolus this am with renal function up today Hold diuretics Rhythm improving but with PVCs. Will see what EP service recommends (?amio) Continue coumadin    Coralie Common, MD '@DATE'$ @

## 2021-10-27 NOTE — Progress Notes (Addendum)
NAME:  Jon [name], MRN:  [id number], DOB:  Jul 18, 1945, LOS: 76 ADMISSION DATE:  10/13/2021, CONSULTATION DATE:  10/22/21 REFERRING MD:  Lavonna Monarch - CVTS, CHIEF COMPLAINT:  s/p  CABG   History of Present Illness:  76 yo M PMH HTN, pAFib not on AC, OSA, hx tobacco use, DM2 presented 9/13  chest pain + near syncope initially leaving the ED AMA but became too dizzy and SOB to elope. Admitted to [facility] with NSTEMI, cards consulted 9/13 and went for cath unsuccessful PCI RPLV. 9/15 unsuccessful PCI LAD. CVTS consult 9/15 for operative evaluation.   Underwent CABG x 3, MAZE, MVR 9/22 and transferred to ICU after case   PCCM is consulted for help with medical management  Pertinent  Medical History  Afib OSA on CPAP Tobacco use HTN HLD  CAD Mitral insufficiency   Significant Hospital Events: Including procedures, antibiotic start and stop dates in addition to other pertinent events   9/13 admit to [facility] w inferior STEMI. Cath lab with incomplete PCI RPLV 9/15 unsuccessful PCI LAD. CVTS consult 9/22 CABG x 3, MAZE, MVR  9/25 No acute issues overnight, remains on low dose Epi drip   9/26 remains on both low-dose epi and vaso drips, repeat echocardiogram as below 9/27 weaned off pressors yesterday afternoon  Interim History / Subjective:  Seen sitting up in bedside recliner with no acute complaints  Objective   Blood pressure (!) 140/78, pulse 72, temperature 98.4 F (36.9 C), temperature source Oral, resp. rate (!) 21, height '5\' 10"'$  (1.778 m), weight 92.4 kg, SpO2 90 %.        Intake/Output Summary (Last 24 hours) at 10/27/2021 0824 Last data filed at 10/27/2021 0200 Gross per 24 hour  Intake 859.16 ml  Output 825 ml  Net 34.16 ml    Filed Weights   10/24/21 0500 10/25/21 0500 10/26/21 0447  Weight: (S) 93.1 kg 92.1 kg 92.4 kg    Examination: General: Acute on chronic ill-appearing elderly male sitting up in bedside recliner in no acute distress HEENT: [place]/AT, MM pink/moist,  PERRL,  Neuro: Alert and oriented x3, nonfocal CV: s1s2 regular rate and rhythm, no murmur, rubs, or gallops,  PULM: Clear to auscultation bilaterally, no increased work of breathing, no added breath sounds GI: soft, bowel sounds active in all 4 quadrants, non-tender, non-distended, tolerating oral diet Extremities: warm/dry, no edema  Skin: no rashes or lesions  Resolved Hospital Problem list     Assessment & Plan:  Acute inferior STEMI CAD s/p CABG x3 Mitral insufficiency s/p MVR pAfib s/p MAZE  Shock  -Cardiogenic (most likely) +/- vasoplegia -Echocardiogram 9/25 EF 60 to 65% with moderately reduced RV systolic function and small Pericardial effusion with tamponade P: Primary management per TCTS Continue aspirin and statin Now weaned off vasopressors Continue midodrine 15 mg 3 times daily Further diuretics per primary Continuous telemetry Optimize electrolytes  Acute respiratory failure with hypoxia - improved  Pulmonary edema P: Currently weaned to room air Encourage frequent and adequate pulmonary hygiene Mobilize as able  Expected post op ABLA -In setting of cardiac surgeries above P: Continue to trend CBC Transfuse per protocol Hemoglobin goal greater than 7  AKI, likely contrast-induced -Serum creatinine remained stable, baseline is around 0.9 P: Follow renal function  Monitor urine output Trend Bmet Avoid nephrotoxins Ensure adequate renal perfusion   Dm2, controlled P: Continue SSI as needed CBG goal 140-180   Best Practice (right click and "Reselect all SmartList Selections" daily)   Diet/type: Consistent  carbohydrate diet DVT prophylaxis: not indicated GI prophylaxis: PPI Lines: Central line, Arterial Line, and yes and it is still needed Foley:  Yes, and it is still needed Code Status:  full code Last date of multidisciplinary goals of care discussion: Continue to update patient and family daily  Critical care: NA  Kashawn Dirr D. Kenton Kingfisher,  NP-C [facility] Personal contact information can be found on Amion  10/27/2021, 8:24 AM

## 2021-10-28 ENCOUNTER — Other Ambulatory Visit: Payer: Self-pay | Admitting: Cardiology

## 2021-10-28 DIAGNOSIS — N179 Acute kidney failure, unspecified: Secondary | ICD-10-CM

## 2021-10-28 DIAGNOSIS — R9431 Abnormal electrocardiogram [ECG] [EKG]: Secondary | ICD-10-CM | POA: Diagnosis not present

## 2021-10-28 DIAGNOSIS — Z9889 Other specified postprocedural states: Secondary | ICD-10-CM

## 2021-10-28 DIAGNOSIS — D62 Acute posthemorrhagic anemia: Secondary | ICD-10-CM | POA: Diagnosis not present

## 2021-10-28 LAB — GLUCOSE, CAPILLARY
Glucose-Capillary: 138 mg/dL — ABNORMAL HIGH (ref 70–99)
Glucose-Capillary: 120 mg/dL — ABNORMAL HIGH (ref 70–99)
Glucose-Capillary: 173 mg/dL — ABNORMAL HIGH (ref 70–99)
Glucose-Capillary: 100 mg/dL — ABNORMAL HIGH (ref 70–99)

## 2021-10-28 LAB — PROTIME-INR
INR: 2.2 — ABNORMAL HIGH (ref 0.8–1.2)
Prothrombin Time: 24.5 seconds — ABNORMAL HIGH (ref 11.4–15.2)

## 2021-10-28 LAB — BASIC METABOLIC PANEL
Anion gap: 6 (ref 5–15)
BUN: 21 mg/dL (ref 8–23)
CO2: 25 mmol/L (ref 22–32)
Calcium: 8.5 mg/dL — ABNORMAL LOW (ref 8.9–10.3)
Chloride: 108 mmol/L (ref 98–111)
Creatinine, Ser: 1.44 mg/dL — ABNORMAL HIGH (ref 0.61–1.24)
GFR, Estimated: 50 mL/min — ABNORMAL LOW (ref >60)
Glucose, Bld: 177 mg/dL — ABNORMAL HIGH (ref 70–99)
Potassium: 4.1 mmol/L (ref 3.5–5.1)
Sodium: 139 mmol/L (ref 135–145)

## 2021-10-28 MED ORDER — INSULIN ASPART 100 UNIT/ML IJ SOLN
0.0000 [IU] | Freq: Three times a day (TID) | INTRAMUSCULAR | Status: DC
  Administered 2021-10-28: 2 [IU] via SUBCUTANEOUS
  Administered 2021-10-28: 4 [IU] via SUBCUTANEOUS
  Administered 2021-10-29 – 2021-10-30 (×5): 2 [IU] via SUBCUTANEOUS
  Administered 2021-10-31: 4 [IU] via SUBCUTANEOUS
  Administered 2021-10-31: 2 [IU] via SUBCUTANEOUS
  Administered 2021-11-01: 4 [IU] via SUBCUTANEOUS
  Administered 2021-11-01: 2 [IU] via SUBCUTANEOUS

## 2021-10-28 MED ORDER — WARFARIN SODIUM 2.5 MG PO TABS
2.5000 mg | ORAL_TABLET | Freq: Once | ORAL | Status: AC
  Administered 2021-10-28: 2.5 mg via ORAL
  Filled 2021-10-28: qty 1

## 2021-10-28 NOTE — Progress Notes (Signed)
CPAP set up at patients beside.  Patient will self administer when he is ready for rest.  Pt aware if he needs assistance to have RN call RT.

## 2021-10-28 NOTE — Plan of Care (Signed)
  Problem: Cardiac: Goal: Will achieve and/or maintain hemodynamic stability Outcome: Progressing   Problem: Respiratory: Goal: Respiratory status will improve Outcome: Progressing   Problem: Pain Managment: Goal: General experience of comfort will improve Outcome: Progressing

## 2021-10-28 NOTE — Progress Notes (Addendum)
Rounding Note    Patient Name: Jon [name] Date of Encounter: 10/28/2021  [facility] Cardiologist: None   Subjective   OOB to chair, feels better today  Inpatient Medications    Scheduled Meds:  aspirin EC  81 mg Oral Daily   atorvastatin  80 mg Oral Daily   bisacodyl  10 mg Oral Daily   Or   bisacodyl  10 mg Rectal Daily   Chlorhexidine Gluconate Cloth  6 each Topical Daily   docusate sodium  200 mg Oral Daily   insulin aspart  0-24 Units Subcutaneous TID WC   melatonin  3 mg Oral QHS   metoCLOPramide (REGLAN) injection  10 mg Intravenous Q6H   midodrine  15 mg Oral TID WC   pantoprazole  40 mg Oral Daily   sodium chloride flush  3 mL Intravenous Q12H   warfarin  2.5 mg Oral ONCE-1600   Warfarin - Pharmacist Dosing Inpatient   Does not apply q1600   Continuous Infusions:  vasopressin Stopped (10/26/21 1430)   PRN Meds: chlorproMAZINE, ondansetron (ZOFRAN) IV, mouth rinse, oxyCODONE, sodium chloride flush, sodium chloride flush, traMADol   Vital Signs    Vitals:   10/27/21 2200 10/28/21 0000 10/28/21 0100 10/28/21 0500  BP: 115/77 101/72    Pulse: (!) 104 97 70 (!) 109  Resp: 15 16 (!) 23 16  Temp:      TempSrc:      SpO2: 93% 97% 93% 90%  Weight:    92 kg  Height:        Intake/Output Summary (Last 24 hours) at 10/28/2021 0848 Last data filed at 10/28/2021 0800 Gross per 24 hour  Intake 1763.33 ml  Output 4575 ml  Net -2811.67 ml      10/28/2021    5:00 AM 10/26/2021    4:47 AM 10/25/2021    5:00 AM  Last 3 Weights  Weight (lbs) 202 lb 13.2 oz 203 lb 11.3 oz 203 lb 0.7 oz  Weight (kg) 92 kg 92.4 kg 92.1 kg      Telemetry    SR, PACs, PVCs, no bradycardia, no pacing - Personally Reviewed  ECG    Last was SR 80bpm, 1st degree AVblock 265m, QRS has narrowed to 1072m still with a RBBB morphology - Personally Reviewed  Physical Exam   Unchanged exam today GEN: No acute distress.   Neck: No JVD Cardiac: RRR, no murmurs,  rubs, or gallops.  Respiratory: slightly diminished at the bases. GI: Soft, nontender, non-distended  MS: No edema; No deformity. Neuro:  Nonfocal  Psych: Normal affect   Labs    High Sensitivity Troponin:   Recent Labs  Lab 10/13/21 0714 10/13/21 0836 10/13/21 1150 10/13/21 1327 10/13/21 1854  TROPONINIHS 397* 1,101* 9,868* 9,340* 15,166*     Chemistry Recent Labs  Lab 10/21/21 1207 10/22/21 0813 10/23/21 0403 10/23/21 1532 10/24/21 0350 10/26/21 0423 10/27/21 0459 10/28/21 0711  NA 138   < > 138 134*   < > 134* 136 139  K 4.0   < > 4.9 5.6*   < > 4.3 3.9 4.1  CL 105   < > 107 104   < > 101 100 108  CO2 27   < > 21* 19*   < > '25 26 25  '$ GLUCOSE 109*   < > 146* 199*   < > 132* 92 177*  BUN 16   < > 14 11   < > 21 27* 21  CREATININE  1.41*   < > 1.37* 1.34*   < > 1.39* 1.49* 1.44*  CALCIUM 9.8   < > 7.9* 7.9*   < > 8.1* 8.3* 8.5*  MG  --    < > 2.4 2.2  --  2.1  --   --   PROT 7.3  --   --   --   --   --   --   --   ALBUMIN 4.0  --   --   --   --   --   --   --   AST 30  --   --   --   --   --   --   --   ALT 29  --   --   --   --   --   --   --   ALKPHOS 49  --   --   --   --   --   --   --   BILITOT 0.8  --   --   --   --   --   --   --   GFRNONAA 52*   < > 53* 55*   < > 53* 48* 50*  ANIONGAP 6   < > 10 11   < > '8 10 6   '$ < > = values in this interval not displayed.    Lipids No results for input(s): "CHOL", "TRIG", "HDL", "LABVLDL", "LDLCALC", "CHOLHDL" in the last 168 hours.  Hematology Recent Labs  Lab 10/25/21 0332 10/26/21 0423 10/27/21 0459  WBC 11.5* 11.5* 10.5  RBC 3.01* 2.88* 2.79*  HGB 8.9* 8.5* 8.2*  HCT 26.6* 26.0* 24.4*  MCV 88.4 90.3 87.5  MCH 29.6 29.5 29.4  MCHC 33.5 32.7 33.6  RDW 15.5 15.5 15.2  PLT 260 318 350   Thyroid No results for input(s): "TSH", "FREET4" in the last 168 hours.  BNPNo results for input(s): "BNP", "PROBNP" in the last 168 hours.  DDimer No results for input(s): "DDIMER" in the last 168 hours.   Radiology      Cardiac Studies   10/25/21: TTE 1. Left ventricular ejection fraction, by estimation, is 60 to 65%. Left  ventricular ejection fraction by PLAX is 60 %. The left ventricle has  normal function. The left ventricle has no regional wall motion  abnormalities. Left ventricular diastolic  function could not be evaluated.   2. Right ventricular systolic function is moderately reduced. The right  ventricular size is mildly enlarged.   3. The mitral valve has been repaired/replaced. Trivial mitral valve  regurgitation. No evidence of mitral stenosis. The mean mitral valve  gradient is 4.0 mmHg. There is a 30 mm Carpentier-Adams prosthetic  annuloplasty ring present in the mitral  position. Procedure Date: 10/22/2021.   4. A small pericardial effusion is present. The pericardial effusion is  circumferential. There is no evidence of cardiac tamponade.   5. The aortic valve is tricuspid. There is severe calcifcation of the  aortic valve. There is mild thickening of the aortic valve. Aortic valve  regurgitation is not visualized. Mild aortic valve stenosis. Aortic valve  area, by VTI measures 1.92 cm.  Aortic valve mean gradient measures 6.0 mmHg.   Comparison(s): Changes from prior study are noted. 10/22/2021: LVEF 55%,  calcified trileaflet aortic valve, mitral valve repair.      10/22/2021: inter-op TEE Complications: No known complications during this procedure.  POST-OP IMPRESSIONS  _ Left Ventricle: has normal systolic function, with  an ejection fraction  of  55%. The cavity size was normal. The wall motion is abnormal with regional  variation. Inferior wall severe hypokinesis is comparable to pre-bypass  function.  _ Right Ventricle: The right ventricle appears unchanged from pre-bypass.  _ Aorta: The aorta appears unchanged from pre-bypass.  _ Left Atrial Appendage: Has been surgically closed and the repair appears  to be  oversewn.  _ Aortic Valve: The aortic valve appears  unchanged from pre-bypass. No  stenosis  present.  _ Mitral Valve: No regurgitation post repair. The gradient recorded across  the  prosthetic valve is within the expected range.Mitral valve repair with an  Edwards 30 mm Carpentier-Adams ischemic ring annuloplasty. Mean pressure  gradient by pulse wave doppler was 52mHg.. The mitral valve is status post  repair with an annuloplasty ring.  _ Tricuspid Valve: There is mild regurgitation.  _ Pulmonic Valve: The pulmonic valve appears unchanged from pre-bypass.  _ Interatrial Septum: The interatrial septum appears unchanged from  pre-bypass.  _ Pericardium: The pericardium appears unchanged from pre-bypass.  _ Comments: Post-bypass images reviewed with surgeon.  PRE-OP FINDINGS   Left Ventricle: The left ventricle has normal systolic function, with an  ejection fraction of 60-65%. The cavity size was normal. There is mild  concentric left ventricular hypertrophy. Inferior and inferolateral severe  hypokinesis.    Right Ventricle: The right ventricle has normal systolic function. The  cavity was normal. There is no increase in right ventricular wall  thickness.   Left Atrium: Left atrial size was dilated. No left atrial/left atrial  appendage thrombus was detected. Left atrial appendage velocity is reduced  at less than 40 cm/s.   Right Atrium: Right atrial size was normal in size.   Interatrial Septum: No atrial level shunt detected by color flow Doppler.   Pericardium: There is no evidence of pericardial effusion.   Mitral Valve: The mitral valve is normal in structure. Mitral valve  regurgitation is severe by color flow Doppler. There is mild thickening  and mild calcification present.   Tricuspid Valve: The tricuspid valve was normal in structure. Tricuspid  valve regurgitation is trivial by color flow Doppler.   Aortic Valve: The aortic valve is tricuspid Aortic valve regurgitation was  not visualized by color flow  Doppler. There is no stenosis of the aortic  valve.  There is severe calcifcation present. AVA by VTI measures 2.25 cm2.   Pulmonic Valve: The pulmonic valve was normal in structure.  Pulmonic valve regurgitation is not visualized by color flow Doppler.    Aorta: The aortic root, aortic arch and ascending aorta are normal in size  and structure. There is evidence of plaque in the descending aorta; Grade  I, measuring 1-362min size.   Pulmonary Artery: The pulmonary artery is of normal size.    10/15/2021: LHC Left Anterior Descending  Prox LAD to Mid LAD lesion is 90% stenosed. The lesion is severely calcified.  Mid LAD lesion is 80% stenosed. The lesion is calcified.    Right Coronary Artery  There is mild diffuse disease throughout the vessel. The RCA is ectatic, calcified, and tortuous throughout with mild to moderate diffuse plaquing  Prox RCA lesion is 50% stenosed.    Right Posterior Descending Artery  RPDA lesion is 60% stenosed.    Right Posterior Atrioventricular Artery  RPAV lesion is 95% stenosed. The lesion is thrombotic. The lesion was previously treated .        10/15/21 limited TTE 1. The  aortic valve is calcified. There is severe calcifcation of the  aortic valve. Aortic valve regurgitation is not visualized. Mild aortic  valve stenosis. Aortic valve area, by VTI measures 1.92 cm. Aortic valve  mean gradient measures 9.0 mmHg.  Aortic valve Vmax measures 1.89 m/s.   Conclusion(s)/Recommendation(s): Limited study to assess aortic valve  stenosis. Stenosis is mild visually, by gradients, by dimensionless index,  and by valve area.      10/13/21: TTE 1. Left ventricular ejection fraction, by estimation, is 60 to 65%. The  left ventricle has normal function. The left ventricle demonstrates  regional wall motion abnormalities with basal inferior and basal  inferolateral severe hypokinesis. There is mild  concentric left ventricular hypertrophy. Left  ventricular diastolic  parameters are consistent with Grade II diastolic dysfunction  (pseudonormalization).   2. Right ventricular systolic function is normal. The right ventricular  size is normal. Tricuspid regurgitation signal is inadequate for assessing  PA pressure.   3. The mitral valve is normal in structure. Mild mitral valve  regurgitation. No evidence of mitral stenosis.   4. The aortic valve is tricuspid. There is severe calcification of the  aortic valve. Aortic valve regurgitation is not visualized. There appears  to be some degree of aortic stenosis but the aortic valve was not fully  interrogated by doppler. Would  consider repeat limited echo with attention to aortic valve doppler  interrogation.   5. The inferior vena cava is normal in size with greater than 50%  respiratory variability, suggesting right atrial pressure of 3 mmHg.      Patient Profile     76 y.o. male  w/PMHx of AFib, HTN, DM, OSA w/CPAP, former smoker   admitted 10/13/21, progressive weakness, fatigue, initially HS Trop were only minimally elevated and he opted to leave AMA > on his way out became profoundly dizzy, brought to a room and Trops were up to 1100 >> 9868, EKG  without STEMI, but concern for inf/posterior MI.   TTE with preserved LVEF, cath with multivessel disease and failed attempt to intervene on RPLV and LAD, planned for CABG after Brlinita washout   10/22/21 CABG X 3 with the LIMA to the LAD and RSVG from the aorta to the PDA sequenced to the PLAT Radiofrequency Pulmonary vein isolation  maze ablation  Mitral valve repair with an Edwards 30 mm Carpentier-Adams ischemic ring annuloplasty  Closure of LA appendage  He has had some PAFlutte (PAFib is known for him) and some pacing as well, EP called in POD #4  Assessment & Plan    Post-op AFlutter (though known for him at baseline to have Afib) Post op sinus bradycardia POD #5 CABG X 3 with the LIMA to the LAD and RSVG from the  aorta to the PDA sequenced to the PLAT Radiofrequency Pulmonary vein isolation  maze ablation  Mitral valve repair with an Edwards 30 mm Carpentier-Adams ischemic ring annuloplasty  Closure of LA appendage  Off pressor, on high dose midodrine  Dr. Myles Gip has seen the patient/reviewed telemetry No pacing/bradycardia With pacing rate at 40bpm  Continue to hold off nodal blocking agents Some discussion yesterday of amio for his ectopy, will discuss with Dr. Myles Gip  ADDEND: D/w Dr. Myles Gip, no role for amiodarone for treatment of his ectopy Would hold off on trial of nodal blockers for a couple months   For questions or updates, please contact [place] Please consult [email or web address] for contact info under  Signed, Baldwin Jamaica, PA-C  10/28/2021, 8:48 AM

## 2021-10-28 NOTE — Progress Notes (Signed)
[address]       [place] [address]             [phone]      6 Days Post-Op  Procedure(s) (LRB): CORONARY ARTERY BYPASS GRAFTING (CABG) X THREE, USING LEFT INTERNAL MAMMARY ARTERY AND RIGHT LEG GREATER SAPHENOUS VEIN HARVESTED ENDOSCOPICALLY (N/A) MAZE (N/A) TRANSESOPHAGEAL ECHOCARDIOGRAM (TEE) (N/A) MITRAL VALVE REPAIR (MVR) USING MCCARTHY-ADAMS RING SIZE 30MM (N/A)   Total Length of Stay:  LOS: 15 days    SUBJECTIVE: No more hiccups Walked full loop Tolerating diet  Vitals:   10/28/21 0100 10/28/21 0500  BP:    Pulse: 70 (!) 109  Resp: (!) 23 16  Temp:    SpO2: 93% 90%    Intake/Output      09/27 0701 09/28 0700 09/28 0701 09/29 0700   P.O. 770    I.V. (mL/kg) 1102.4 (12)    Total Intake(mL/kg) 1872.4 (20.4)    Urine (mL/kg/hr) 4275 (1.9)    Total Output 4275    Net -2402.6             sodium chloride     sodium chloride Stopped (10/24/21 0904)   sodium chloride 20 mL/hr at 10/28/21 0600   lactated ringers     lactated ringers Stopped (10/24/21 0858)   vasopressin Stopped (10/26/21 1430)    CBC    Component Value Date/Time   WBC 10.5 10/27/2021 0459   RBC 2.79 (L) 10/27/2021 0459   HGB 8.2 (L) 10/27/2021 0459   HGB 15.8 03/25/2014 1049   HCT 24.4 (L) 10/27/2021 0459   HCT 48.6 03/25/2014 1049   PLT 350 10/27/2021 0459   PLT 332 03/25/2014 1049   MCV 87.5 10/27/2021 0459   MCV 85.9 03/25/2014 1049   MCH 29.4 10/27/2021 0459   MCHC 33.6 10/27/2021 0459   RDW 15.2 10/27/2021 0459   RDW 15.0 (H) 03/25/2014 1049   LYMPHSABS 3.8 10/13/2021 0510   LYMPHSABS 2.0 03/25/2014 1049   MONOABS 1.0 10/13/2021 0510   MONOABS 0.8 03/25/2014 1049   EOSABS 0.4 10/13/2021 0510   EOSABS 0.2 03/25/2014 1049   BASOSABS 0.1 10/13/2021 0510   BASOSABS 0.0 03/25/2014 1049   CMP     Component Value Date/Time   NA 136 10/27/2021 0459   K 3.9 10/27/2021 0459   CL 100 10/27/2021 0459   CO2 26 10/27/2021 0459   GLUCOSE 92  10/27/2021 0459   BUN 27 (H) 10/27/2021 0459   CREATININE 1.49 (H) 10/27/2021 0459   CREATININE 1.20 02/15/2011 0846   CALCIUM 8.3 (L) 10/27/2021 0459   PROT 7.3 10/21/2021 1207   ALBUMIN 4.0 10/21/2021 1207   AST 30 10/21/2021 1207   ALT 29 10/21/2021 1207   ALKPHOS 49 10/21/2021 1207   BILITOT 0.8 10/21/2021 1207   GFRNONAA 48 (L) 10/27/2021 0459   GFRAA 52 (L) 11/23/2015 2118   ABG    Component Value Date/Time   PHART 7.293 (L) 10/22/2021 2240   PCO2ART 38.4 10/22/2021 2240   PO2ART 102 10/22/2021 2240   HCO3 18.4 (L) 10/22/2021 2240   TCO2 20 (L) 10/22/2021 2240   ACIDBASEDEF 7.0 (H) 10/22/2021 2240   O2SAT 97 10/22/2021 2240   CBG (last 3)  Recent Labs    10/27/21 1633 10/27/21 1829 10/28/21 0647  GLUCAP 81 203* 138*   EXAM Awake and alert Lungs: clear Cardiac: RR with extra systoles Ext: with trace edema  ASSESSMENT: Doing better after CABG/Maze/Mvrepair Ok to  transfer to floor DC central line Check BMP and if Cr lower will start daily lasix Check with EP on starting low dose bb Continue coumadin   Coralie Common, MD '@DATE'$ @

## 2021-10-28 NOTE — Progress Notes (Signed)
NAME:  Jon [name], MRN:  [id number], DOB:  17-Mar-1945, LOS: 67 ADMISSION DATE:  10/13/2021, CONSULTATION DATE:  10/22/21 REFERRING MD:  Lavonna Monarch - CVTS, CHIEF COMPLAINT:  s/p  CABG   History of Present Illness:  76 yo M PMH HTN, pAFib not on AC, OSA, hx tobacco use, DM2 presented 9/13  chest pain + near syncope initially leaving the ED AMA but became too dizzy and SOB to elope. Admitted to [facility] with NSTEMI, cards consulted 9/13 and went for cath unsuccessful PCI RPLV. 9/15 unsuccessful PCI LAD. CVTS consult 9/15 for operative evaluation.   Underwent CABG x 3, MAZE, MVR 9/22 and transferred to ICU after case   PCCM is consulted for help with medical management  Pertinent  Medical History  Afib OSA on CPAP Tobacco use HTN HLD  CAD Mitral insufficiency   Significant Hospital Events: Including procedures, antibiotic start and stop dates in addition to other pertinent events   9/13 admit to [facility] w inferior STEMI. Cath lab with incomplete PCI RPLV 9/15 unsuccessful PCI LAD. CVTS consult 9/22 CABG x 3, MAZE, MVR  9/25 No acute issues overnight, remains on low dose Epi drip   9/26 remains on both low-dose epi and vaso drips, repeat echocardiogram as below 9/27 weaned off pressors yesterday afternoon 9/28 off pressors, OOB in chair   Interim History / Subjective:  No overnight events, stable, sitting up with no complaints other than tired and achy  Objective   Blood pressure 135/79, pulse (!) 109, temperature 98.7 F (37.1 C), temperature source Oral, resp. rate (!) 21, height '5\' 10"'$  (1.778 m), weight 92 kg, SpO2 90 %.        Intake/Output Summary (Last 24 hours) at 10/28/2021 1114 Last data filed at 10/28/2021 0800 Gross per 24 hour  Intake 1192.84 ml  Output 4175 ml  Net -2982.16 ml    Filed Weights   10/25/21 0500 10/26/21 0447 10/28/21 0500  Weight: 92.1 kg 92.4 kg 92 kg       General:  ill-appearing elderly M, sitting up in bed in no distress HEENT: MM  pink/moist, sclera anicteric Neuro: awake and alert, neuro intact CV: s1s2 rrr, no m/r/g PULM:  mildly decreased air entry bilateral bases, no distress on [place] GI: soft, non-tender Extremities: warm/dry, no edema  Skin: no rashes or lesions   Resolved Hospital Problem list     Assessment & Plan:  Acute inferior STEMI CAD s/p CABG x3 Mitral insufficiency s/p MVR pAfib s/p MAZE  Shock  -Cardiogenic (most likely) +/- vasoplegia -Echocardiogram 9/25 EF 60 to 65% with moderately reduced RV systolic function and small Pericardial effusion with tamponade P: -management per TCTS -Continue aspirin and statin -EP following, has not required pacing, hold off nodal blocking agents -Now weaned off vasopressors -Continue midodrine 15 mg 3 times daily -Continuous telemetry -Optimize electrolytes -Pt to be transferred out of ICU today, PCCM will s/o  Acute respiratory failure with hypoxia - improved  Pulmonary edema P: -Currently weaned to room air -Encourage frequent and adequate pulmonary hygiene -Mobilize as able  Expected post op ABLA -In setting of cardiac surgeries above P: -Continue to trend CBC, Transfuse per protocol for Hgb >7   AKI, likely contrast-induced -Serum creatinine remained stable about 1.4, baseline is around 0.9 4L UOP yesterday P: -Follow renal function  -Monitor urine output -Trend Bmet -Avoid nephrotoxins -Ensure adequate renal perfusion   Dm2, controlled P: -Continue SSI as needed -CBG goal 140-180   Best Practice (right click and "  Reselect all SmartList Selections" daily)   Diet/type: Consistent carbohydrate diet DVT prophylaxis: not indicated GI prophylaxis: PPI Lines: No longer needed.  Order written to d/c  Foley:  Yes, and it is still needed Code Status:  full code Last date of multidisciplinary goals of care discussion: per primary  Critical care: NA   Otilio Carpen Jahziah Simonin, PA-C [facility] See Amion for pager If  no response to pager , please call [phone] until 7pm After 7:00 pm call Elink  [phone]

## 2021-10-28 NOTE — Progress Notes (Signed)
Pt arrived to 4e from 2h. Pt oriented to room and staff. Vitals obtained. Tele placed and CCMD notified. External pacer verified. Family at bedside.

## 2021-10-28 NOTE — Progress Notes (Signed)
OT Cancellation Note  Patient Details Name: Jon [name] MRN: [id number] DOB: [birth year]   Cancelled Treatment:    Reason Eval/Treat Not Completed: Other (comment) (transferring rooms)  Jaci Carrel 10/28/2021, 2:13 PM   Jesse Sans OTR/L Acute Rehabilitation Services Office: [phone]

## 2021-10-29 LAB — GLUCOSE, CAPILLARY
Glucose-Capillary: 120 mg/dL — ABNORMAL HIGH (ref 70–99)
Glucose-Capillary: 151 mg/dL — ABNORMAL HIGH (ref 70–99)
Glucose-Capillary: 156 mg/dL — ABNORMAL HIGH (ref 70–99)
Glucose-Capillary: 103 mg/dL — ABNORMAL HIGH (ref 70–99)

## 2021-10-29 LAB — BASIC METABOLIC PANEL
Anion gap: 7 (ref 5–15)
BUN: 16 mg/dL (ref 8–23)
CO2: 24 mmol/L (ref 22–32)
Calcium: 8.5 mg/dL — ABNORMAL LOW (ref 8.9–10.3)
Chloride: 108 mmol/L (ref 98–111)
Creatinine, Ser: 1.28 mg/dL — ABNORMAL HIGH (ref 0.61–1.24)
GFR, Estimated: 58 mL/min — ABNORMAL LOW (ref >60)
Glucose, Bld: 121 mg/dL — ABNORMAL HIGH (ref 70–99)
Potassium: 4.4 mmol/L (ref 3.5–5.1)
Sodium: 139 mmol/L (ref 135–145)

## 2021-10-29 LAB — PROTIME-INR
INR: 2.3 — ABNORMAL HIGH (ref 0.8–1.2)
Prothrombin Time: 24.7 seconds — ABNORMAL HIGH (ref 11.4–15.2)

## 2021-10-29 MED ORDER — WARFARIN SODIUM 2.5 MG PO TABS: 2.5000 mg | ORAL_TABLET | Freq: Once | ORAL | Status: DC

## 2021-10-29 MED ORDER — WARFARIN - PHYSICIAN DOSING INPATIENT: Freq: Every day | Status: DC

## 2021-10-29 MED FILL — Antithrombin III (Human) For Inj 500 Unit: INTRAVENOUS | Qty: 20 | Status: AC

## 2021-10-29 NOTE — Progress Notes (Signed)
Pt's HR been 110s-130s (ST). No Prn meds fro increased HR. RN uncomfortable pulling pacing wires. PA bailey and wayne notified and aware.   Lavenia Atlas, RN

## 2021-10-29 NOTE — Progress Notes (Signed)
Physical Therapy Treatment Patient Details Name: Jon [name] MRN: [id number] DOB: 04-21-1945 Today's Date: 10/29/2021   History of Present Illness Pt is a 76 y/o male admitted 10/13/21 with CP and lightheadedness. Found to have A-fib with SSS, symptomatic bradycardia, STEMI s/p unsuccessful PCI 9/13. 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 Pt with post-op bradycardia. PMH includes A-fib, DM, glaucoma, HTN, OSA.    PT Comments    Patient progressing well and eager to mobilize with therapy. HR resting in 70's  at start of session. Reviewed sternal precautions with pt "move in the tube" and pt able to rise from recliner with min assist and hands on knees with use of momentum to initiate rise. Pt ambulated ~250' with 1 standing rest break halfway. No LOB noted throughout and pt maintained safe proximity to RW. Will continue to progress as able; continue to recommend HHPT.    Recommendations for follow up therapy are one component of a multi-disciplinary discharge planning process, led by the attending physician.  Recommendations may be updated based on patient status, additional functional criteria and insurance authorization.  Follow Up Recommendations  Home health PT     Assistance Recommended at Discharge Frequent or constant Supervision/Assistance  Patient can return home with the following A little help with walking and/or transfers;A little help with bathing/dressing/bathroom;Help with stairs or ramp for entrance;Assist for transportation;Assistance with cooking/housework   Equipment Recommendations  None recommended by PT    Recommendations for Other Services       Precautions / Restrictions Precautions Precautions: Fall;Sternal;Other (comment) (external pacemaker) Precaution Booklet Issued: No Precaution Comments: educated in sternal precautions Restrictions Weight Bearing Restrictions: No     Mobility  Bed Mobility               General bed mobility comments: OOB in  recliner    Transfers Overall transfer level: Needs assistance Equipment used: Rolling walker (2 wheels) Transfers: Sit to/from Stand Sit to Stand: Min assist           General transfer comment: hands on knees, use of momentum, assist to rise and steady. pt required min assist to fully rise    Ambulation/Gait Ambulation/Gait assistance: Min guard Gait Distance (Feet): 240 Feet Assistive device: Rolling walker (2 wheels) Gait Pattern/deviations: Step-through pattern, Decreased stride length, Trunk flexed Gait velocity: decr     General Gait Details: trunk slightly flexed at start, walker adjustment improved posture. Pt maintained safe proximity to RW and smooth cautious pace throughout; appears to have reduced reliance on UE support. HR stable with gait up to 90's from 70's at rest. SpO2 >93% on RA. pt reports slight SOB and cold after back in room.   Stairs             Wheelchair Mobility    Modified Rankin (Stroke Patients Only)       Balance Overall balance assessment: Needs assistance Sitting-balance support: Feet supported, No upper extremity supported Sitting balance-Leahy Scale: Good     Standing balance support: During functional activity, Reliant on assistive device for balance Standing balance-Leahy Scale: Poor Standing balance comment: Reliant on UEs for support in standing                            Cognition Arousal/Alertness: Awake/alert Behavior During Therapy: WFL for tasks assessed/performed Overall Cognitive Status: Within Functional Limits for tasks assessed  Exercises      General Comments        Pertinent Vitals/Pain Pain Assessment Pain Assessment: Faces Faces Pain Scale: Hurts a little bit Pain Location: sternum Pain Descriptors / Indicators: Discomfort, Sore Pain Intervention(s): Limited activity within patient's tolerance, Monitored during session,  Repositioned    Home Living                          Prior Function            PT Goals (current goals can now be found in the care plan section) Acute Rehab PT Goals Patient Stated Goal: get better and go home PT Goal Formulation: With patient Time For Goal Achievement: 11/08/21 Potential to Achieve Goals: Good Progress towards PT goals: Progressing toward goals    Frequency    Min 3X/week      PT Plan Current plan remains appropriate    Co-evaluation              AM-PAC PT "6 Clicks" Mobility   Outcome Measure  Help needed turning from your back to your side while in a flat bed without using bedrails?: A Little Help needed moving from lying on your back to sitting on the side of a flat bed without using bedrails?: A Little Help needed moving to and from a bed to a chair (including a wheelchair)?: A Little Help needed standing up from a chair using your arms (e.g., wheelchair or bedside chair)?: A Little Help needed to walk in hospital room?: A Little Help needed climbing 3-5 steps with a railing? : Total 6 Click Score: 16    End of Session Equipment Utilized During Treatment: Gait belt Activity Tolerance: Patient tolerated treatment well Patient left: in chair;with call bell/phone within reach Nurse Communication: Mobility status PT Visit Diagnosis: Difficulty in walking, not elsewhere classified (R26.2);Muscle weakness (generalized) (M62.81)     Time: 0175-1025 PT Time Calculation (min) (ACUTE ONLY): 18 min  Charges:  $Gait Training: 8-22 mins                     Verner Mould, DPT Acute Rehabilitation Services Office [phone]  10/29/21 4:07 PM

## 2021-10-29 NOTE — Progress Notes (Addendum)
      [address]       [place] [address]             [phone]       7 Days Post-Op Procedure(s) (LRB): CORONARY ARTERY BYPASS GRAFTING (CABG) X THREE, USING LEFT INTERNAL MAMMARY ARTERY AND RIGHT LEG GREATER SAPHENOUS VEIN HARVESTED ENDOSCOPICALLY (N/A) MAZE (N/A) TRANSESOPHAGEAL ECHOCARDIOGRAM (TEE) (N/A) MITRAL VALVE REPAIR (MVR) USING MCCARTHY-ADAMS RING SIZE 30MM (N/A) Subjective: Patient stated he felt a little funny this morning but otherwise no complaints  Objective: Vital signs in last 24 hours: Temp:  [98.1 F (36.7 C)-98.9 F (37.2 C)] 98.9 F (37.2 C) (09/29 0436) Pulse Rate:  [68-100] 68 (09/29 0436) Cardiac Rhythm: Sinus tachycardia;Heart block (09/28 1710) Resp:  [19-21] 19 (09/29 0436) BP: (96-148)/(62-91) 96/62 (09/29 0436) SpO2:  [90 %-99 %] 90 % (09/29 0436) Weight:  [90.9 kg] 90.9 kg (09/29 0436)  Hemodynamic parameters for last 24 hours:    Intake/Output from previous day: 09/28 0701 - 09/29 0700 In: 360 [P.O.:360] Out: 4150 [Urine:4150] Intake/Output this shift: Total I/O In: -  Out: 350 [Urine:350]  General appearance: alert, cooperative, and no distress Neurologic: intact Heart: regular rate and rhythm, S1, S2 normal, no murmur, click, rub or gallop Lungs: clear to auscultation bilaterally Abdomen: hypoactive BS, nontender Extremities: edema trace Wound: Clean, dry, intact  Lab Results: Recent Labs    10/27/21 0459  WBC 10.5  HGB 8.2*  HCT 24.4*  PLT 350   BMET:  Recent Labs    10/27/21 0459 10/28/21 0711  NA 136 139  K 3.9 4.1  CL 100 108  CO2 26 25  GLUCOSE 92 177*  BUN 27* 21  CREATININE 1.49* 1.44*  CALCIUM 8.3* 8.5*    PT/INR:  Recent Labs    10/29/21 0100  LABPROT 24.7*  INR 2.3*   ABG    Component Value Date/Time   PHART 7.293 (L) 10/22/2021 2240   HCO3 18.4 (L) 10/22/2021 2240   TCO2 20 (L) 10/22/2021 2240   ACIDBASEDEF 7.0 (H) 10/22/2021 2240   O2SAT 97 10/22/2021 2240   CBG  (last 3)  Recent Labs    10/28/21 1559 10/28/21 2121 10/29/21 0618  GLUCAP 173* 100* 120*    Assessment/Plan: S/P Procedure(s) (LRB): CORONARY ARTERY BYPASS GRAFTING (CABG) X THREE, USING LEFT INTERNAL MAMMARY ARTERY AND RIGHT LEG GREATER SAPHENOUS VEIN HARVESTED ENDOSCOPICALLY (N/A) MAZE (N/A) TRANSESOPHAGEAL ECHOCARDIOGRAM (TEE) (N/A) MITRAL VALVE REPAIR (MVR) USING MCCARTHY-ADAMS RING SIZE 30MM (N/A)  CV: Postop afib/aflutter. Sinus rhythm/rate controlled a fib/flutter this morning, rate in the 70's. SBP 96-104 on midodrine '15mg'$  TID. On coumadin 2.'5mg'$ , INR 2.3 today will hold warfarin today and d/c EPW tomorrow. Pulm: Saturating 93% on RA, continue ambulation and IS. GI: +BM, ok appetite, no nausea Endo: DM2 controlled, GCS 173/100/120 Renal: Cr 1.28 today down from 1.44 will continue to monitor cr. +4kg will hold on diuretics for now Expected postop ABLA: stable, will continue to monitor Dispo: D/C EPW tomorrow. Hopefully if rhythm stays stable, d/c home in the next few days.    LOS: 16 days    Magdalene River, PA-C 10/29/2021

## 2021-10-29 NOTE — Progress Notes (Signed)
Telemetry reviewed SR PACs, some PVCs, and rate controlled Afib/flutter No bradycardia  Avoid nodal blocking agents for a couple months No AAD  He has cardiology follow up in place  EP will sign off though remain available Please recall if needed  Tommye Standard, PA-C

## 2021-10-29 NOTE — Progress Notes (Signed)
Placed patient on CPAP for the night at 10cm

## 2021-10-29 NOTE — TOC Progression Note (Signed)
Transition of Care (TOC) - Progression Note  Marvetta Gibbons RN, BSN Transitions of Care Unit 4E- RN Case Manager See Treatment Team for direct phone #    Patient Details  Name: Jon [name] MRN: [id number] Date of Birth: 12-25-1945  Transition of Care [facility]) CM/SW Contact  Dahlia Client, Romeo Rabon, RN Phone Number: 10/29/2021, 2:56 PM  Clinical Narrative:    Orders placed for HHPT/OT, CM in to speak with pt at bedside for transition needs. Per conversation with pt he would like to think about [facility] services, List provided for [facility] choice Per CMS guidelines from medicare.gov website with star ratings (copy placed in shadow chart). Pt reports he has needed DME at home and transport for discharge.   CM will f/u regarding [place] prior to discharge.    Expected Discharge Plan: [place] Barriers to Discharge: Continued Medical Work up  Expected Discharge Plan and Services Expected Discharge Plan: [place] In-house Referral: NA Discharge Planning Services: CM Consult Post Acute Care Choice: NA Living arrangements for the past 2 months: Single Family Home                 DME Arranged: N/A DME Agency: NA       HH Arranged: PT, OT           Social Determinants of Health (SDOH) Interventions    Readmission Risk Interventions    10/18/2021    4:34 PM  Readmission Risk Prevention Plan  Transportation Screening Complete  PCP or Specialist Appt within 3-5 Days Complete  HRI or [place] Complete  Social Work Consult for [place] Planning/Counseling Complete  Palliative Care Screening Not Applicable  Medication Review Press photographer) Referral to Pharmacy

## 2021-10-29 NOTE — Evaluation (Signed)
Occupational Therapy Evaluation Patient Details Name: Jon [name] MRN: [id number] DOB: 01-Oct-1945 Today's Date: 10/29/2021   History of Present Illness Pt is a 76 y/o male admitted 10/13/21 with CP and lightheadedness. Found to have A-fib with SSS, symptomatic bradycardia, STEMI s/p unsuccessful PCI 9/13. 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 Pt with post-op bradycardia. PMH includes A-fib, DM, glaucoma, HTN, OSA.   Clinical Impression   Pt was independent and living alone prior to admission. He reports there are many family members nearby who can assist him at home. Pt presents with generalized weakness, impaired standing balance and decreased activity tolerance. Educated pt in sternal precautions related to ADLs and IADLs and began instruction in energy conservation strategies. Pt requires min assist to stand, but can ambulate with RW with min guard assist. He needs up to min assist for ADLs. Recommending HHOT.      Recommendations for follow up therapy are one component of a multi-disciplinary discharge planning process, led by the attending physician.  Recommendations may be updated based on patient status, additional functional criteria and insurance authorization.   Follow Up Recommendations  Home health OT    Assistance Recommended at Discharge Frequent or constant Supervision/Assistance  Patient can return home with the following A little help with walking and/or transfers;A little help with bathing/dressing/bathroom;Assistance with cooking/housework;Assist for transportation;Help with stairs or ramp for entrance    Functional Status Assessment  Patient has had a recent decline in their functional status and demonstrates the ability to make significant improvements in function in a reasonable and predictable amount of time.  Equipment Recommendations  None recommended by OT    Recommendations for Other Services       Precautions / Restrictions Precautions Precautions:  Fall;Sternal;Other (comment) (external pacemaker) Precaution Booklet Issued: No Precaution Comments: educated in sternal precautions Restrictions Weight Bearing Restrictions: No      Mobility Bed Mobility               General bed mobility comments: in chair    Transfers Overall transfer level: Needs assistance Equipment used: Rolling walker (2 wheels) Transfers: Sit to/from Stand Sit to Stand: Min assist           General transfer comment: hands on knees, use of momentum, assist to rise and steady      Balance Overall balance assessment: Needs assistance   Sitting balance-Leahy Scale: Good     Standing balance support: During functional activity, Reliant on assistive device for balance Standing balance-Leahy Scale: Poor Standing balance comment: Reliant on UEs for support in standing                           ADL either performed or assessed with clinical judgement   ADL Overall ADL's : Needs assistance/impaired Eating/Feeding: Independent;Sitting   Grooming: Min guard;Wash/dry hands;Standing   Upper Body Bathing: Minimal assistance;Sitting   Lower Body Bathing: Minimal assistance;Sit to/from stand   Upper Body Dressing : Sitting;Set up   Lower Body Dressing: Minimal assistance;Sit to/from stand   Toilet Transfer: Min guard;Rolling walker (2 wheels);Ambulation;BSC/3in1   Toileting- Clothing Manipulation and Hygiene: Minimal assistance;Sit to/from stand       Functional mobility during ADLs: Min guard;Rolling walker (2 wheels)       Vision Baseline Vision/History: 1 Wears glasses Ability to See in Adequate Light: 0 Adequate Patient Visual Report: No change from baseline       Perception     Praxis  Pertinent Vitals/Pain Pain Assessment Pain Assessment: Faces Faces Pain Scale: Hurts a little bit Pain Location: sternum Pain Descriptors / Indicators: Discomfort, Sore     Hand Dominance Right   Extremity/Trunk  Assessment Upper Extremity Assessment Upper Extremity Assessment: Overall WFL for tasks assessed   Lower Extremity Assessment Lower Extremity Assessment: Defer to PT evaluation   Cervical / Trunk Assessment Cervical / Trunk Assessment: Normal   Communication Communication Communication: No difficulties   Cognition Arousal/Alertness: Awake/alert Behavior During Therapy: WFL for tasks assessed/performed Overall Cognitive Status: Within Functional Limits for tasks assessed                                       General Comments       Exercises     Shoulder Instructions      Home Living Family/patient expects to be discharged to:: Private residence Living Arrangements: Alone Available Help at Discharge: Family Type of Home: House Home Access: Stairs to enter Technical brewer of Steps: 4 Entrance Stairs-Rails: Right Home Layout: One level         Bathroom Toilet: Handicapped height     Home Equipment: Conservation officer, nature (2 wheels);Rollator (4 wheels);BSC/3in1;Shower seat;Transport chair          Prior Functioning/Environment Prior Level of Function : Independent/Modified Independent             Mobility Comments: Independent, drives. No falls reported ADLs Comments: independent.        OT Problem List: Impaired balance (sitting and/or standing);Decreased activity tolerance;Decreased strength;Decreased knowledge of use of DME or AE;Decreased knowledge of precautions;Cardiopulmonary status limiting activity      OT Treatment/Interventions: Self-care/ADL training;DME and/or AE instruction;Energy conservation;Patient/family education;Balance training    OT Goals(Current goals can be found in the care plan section) Acute Rehab OT Goals OT Goal Formulation: With patient Time For Goal Achievement: 11/12/21 Potential to Achieve Goals: Good ADL Goals Pt Will Perform Grooming: (P) with modified independence;standing Pt Will Perform Lower Body  Bathing: (P) with modified independence;sit to/from stand Pt Will Perform Lower Body Dressing: (P) with modified independence;sit to/from stand Pt Will Transfer to Toilet: (P) with modified independence;ambulating;bedside commode Pt Will Perform Toileting - Clothing Manipulation and hygiene: (P) with modified independence;sit to/from stand Additional ADL Goal #1: (P) Pt will adhere to sternal precautions during ADLs and mobility. Additional ADL Goal #2: (P) Pt will state at least 3 energy conservation strategies as instructed. Additional ADL Goal #3: (P) Pt will perform bed mobility modified independently.  OT Frequency: Min 2X/week    Co-evaluation              AM-PAC OT "6 Clicks" Daily Activity     Outcome Measure Help from another person eating meals?: None Help from another person taking care of personal grooming?: A Little Help from another person toileting, which includes using toliet, bedpan, or urinal?: A Little Help from another person bathing (including washing, rinsing, drying)?: A Little Help from another person to put on and taking off regular upper body clothing?: None Help from another person to put on and taking off regular lower body clothing?: A Little 6 Click Score: 20   End of Session Equipment Utilized During Treatment: Rolling walker (2 wheels);Gait belt  Activity Tolerance: Patient tolerated treatment well Patient left: in chair;with call bell/phone within reach  OT Visit Diagnosis: Unsteadiness on feet (R26.81);Other abnormalities of gait and mobility (R26.89);Muscle weakness (generalized) (M62.81);Pain  Time: 9924-2683 OT Time Calculation (min): 30 min Charges:  OT General Charges $OT Visit: 1 Visit OT Evaluation $OT Eval Moderate Complexity: 1 Mod OT Treatments $Self Care/Home Management : 8-22 mins Cleta Alberts, OTR/L Acute Rehabilitation Services Office: [phone]   Malka So 10/29/2021, 12:29 PM

## 2021-10-29 NOTE — Progress Notes (Signed)
CARDIAC REHAB PHASE I   Pt sitting in chair. He is agreeable to walk in hall. He is feeling little tired today but otherwise ok. Pt oob to standing with moderate assist to stand. Pt maintaining proper sternal precautions well. Once we reached door of room pt needed to go to bathroom for BM. Pt said it is going to be awhile. Pt left with call light in reach pt will call for help when ready. RN made aware that pt in in bathroom. Will continue to follow.   [phone]  Vanessa Barbara, RN BSN 10/29/2021 12:08 PM

## 2021-10-29 NOTE — Plan of Care (Signed)
Problem: Education: Goal: Knowledge of condition and prescribed therapy will improve Outcome: Progressing   Problem: Cardiac: Goal: Will achieve and/or maintain adequate cardiac output Outcome: Progressing   Problem: Physical Regulation: Goal: Complications related to the disease process, condition or treatment will be avoided or minimized Outcome: Progressing   Problem: Education: Goal: Ability to describe self-care measures that may prevent or decrease complications (Diabetes Survival Skills Education) will improve Outcome: Progressing Goal: Individualized Educational Video(s) Outcome: Progressing   Problem: Coping: Goal: Ability to adjust to condition or change in health will improve Outcome: Progressing   Problem: Fluid Volume: Goal: Ability to maintain a balanced intake and output will improve Outcome: Progressing   Problem: Health Behavior/Discharge Planning: Goal: Ability to identify and utilize available resources and services will improve Outcome: Progressing Goal: Ability to manage health-related needs will improve Outcome: Progressing   Problem: Metabolic: Goal: Ability to maintain appropriate glucose levels will improve Outcome: Progressing   Problem: Nutritional: Goal: Maintenance of adequate nutrition will improve Outcome: Progressing Goal: Progress toward achieving an optimal weight will improve Outcome: Progressing   Problem: Skin Integrity: Goal: Risk for impaired skin integrity will decrease Outcome: Progressing   Problem: Tissue Perfusion: Goal: Adequacy of tissue perfusion will improve Outcome: Progressing   Problem: Education: Goal: Understanding of CV disease, CV risk reduction, and recovery process will improve Outcome: Progressing Goal: Individualized Educational Video(s) Outcome: Progressing   Problem: Activity: Goal: Ability to return to baseline activity level will improve Outcome: Progressing   Problem: Cardiovascular: Goal:  Ability to achieve and maintain adequate cardiovascular perfusion will improve Outcome: Progressing Goal: Vascular access site(s) Level 0-1 will be maintained Outcome: Progressing   Problem: Health Behavior/Discharge Planning: Goal: Ability to safely manage health-related needs after discharge will improve Outcome: Progressing   Problem: Education: Goal: Knowledge of General Education information will improve Description: Including pain rating scale, medication(s)/side effects and non-pharmacologic comfort measures Outcome: Progressing   Problem: Health Behavior/Discharge Planning: Goal: Ability to manage health-related needs will improve Outcome: Progressing   Problem: Clinical Measurements: Goal: Ability to maintain clinical measurements within normal limits will improve Outcome: Progressing Goal: Will remain free from infection Outcome: Progressing Goal: Diagnostic test results will improve Outcome: Progressing Goal: Respiratory complications will improve Outcome: Progressing Goal: Cardiovascular complication will be avoided Outcome: Progressing   Problem: Activity: Goal: Risk for activity intolerance will decrease Outcome: Progressing   Problem: Nutrition: Goal: Adequate nutrition will be maintained Outcome: Progressing   Problem: Coping: Goal: Level of anxiety will decrease Outcome: Progressing   Problem: Elimination: Goal: Will not experience complications related to bowel motility Outcome: Progressing Goal: Will not experience complications related to urinary retention Outcome: Progressing   Problem: Pain Managment: Goal: General experience of comfort will improve Outcome: Progressing   Problem: Safety: Goal: Ability to remain free from injury will improve Outcome: Progressing   Problem: Skin Integrity: Goal: Risk for impaired skin integrity will decrease Outcome: Progressing   Problem: Education: Goal: Will demonstrate proper wound care and an  understanding of methods to prevent future damage Outcome: Progressing Goal: Knowledge of disease or condition will improve Outcome: Progressing Goal: Knowledge of the prescribed therapeutic regimen will improve Outcome: Progressing Goal: Individualized Educational Video(s) Outcome: Progressing   Problem: Activity: Goal: Risk for activity intolerance will decrease Outcome: Progressing   Problem: Cardiac: Goal: Will achieve and/or maintain hemodynamic stability Outcome: Progressing   Problem: Clinical Measurements: Goal: Postoperative complications will be avoided or minimized Outcome: Progressing   Problem: Respiratory: Goal: Respiratory status will improve Outcome: Progressing  Problem: Skin Integrity: Goal: Wound healing without signs and symptoms of infection Outcome: Progressing Goal: Risk for impaired skin integrity will decrease Outcome: Progressing

## 2021-10-30 DIAGNOSIS — R9431 Abnormal electrocardiogram [ECG] [EKG]: Secondary | ICD-10-CM | POA: Diagnosis not present

## 2021-10-30 LAB — BASIC METABOLIC PANEL
Anion gap: 9 (ref 5–15)
BUN: 13 mg/dL (ref 8–23)
CO2: 24 mmol/L (ref 22–32)
Calcium: 8.6 mg/dL — ABNORMAL LOW (ref 8.9–10.3)
Chloride: 105 mmol/L (ref 98–111)
Creatinine, Ser: 1.11 mg/dL (ref 0.61–1.24)
GFR, Estimated: >60 mL/min (ref >60)
Glucose, Bld: 122 mg/dL — ABNORMAL HIGH (ref 70–99)
Potassium: 4.2 mmol/L (ref 3.5–5.1)
Sodium: 138 mmol/L (ref 135–145)

## 2021-10-30 LAB — GLUCOSE, CAPILLARY
Glucose-Capillary: 122 mg/dL — ABNORMAL HIGH (ref 70–99)
Glucose-Capillary: 124 mg/dL — ABNORMAL HIGH (ref 70–99)
Glucose-Capillary: 101 mg/dL — ABNORMAL HIGH (ref 70–99)
Glucose-Capillary: 120 mg/dL — ABNORMAL HIGH (ref 70–99)

## 2021-10-30 LAB — CBC
HCT: 25.7 % — ABNORMAL LOW (ref 39.0–52.0)
Hemoglobin: 8.7 g/dL — ABNORMAL LOW (ref 13.0–17.0)
MCH: 29.5 pg (ref 26.0–34.0)
MCHC: 33.9 g/dL (ref 30.0–36.0)
MCV: 87.1 fL (ref 80.0–100.0)
Platelets: 537 10*3/uL — ABNORMAL HIGH (ref 150–400)
RBC: 2.95 MIL/uL — ABNORMAL LOW (ref 4.22–5.81)
RDW: 16.1 % — ABNORMAL HIGH (ref 11.5–15.5)
WBC: 10.7 10*3/uL — ABNORMAL HIGH (ref 4.0–10.5)
nRBC: 3.8 % — ABNORMAL HIGH (ref 0.0–0.2)

## 2021-10-30 LAB — PROTIME-INR
INR: 2 — ABNORMAL HIGH (ref 0.8–1.2)
Prothrombin Time: 22.1 seconds — ABNORMAL HIGH (ref 11.4–15.2)

## 2021-10-30 MED ORDER — AMIODARONE LOAD VIA INFUSION
150.0000 mg | Freq: Once | INTRAVENOUS | Status: DC
  Filled 2021-10-30: qty 83.34

## 2021-10-30 MED ORDER — AMIODARONE IV BOLUS ONLY 150 MG/100ML
150.0000 mg | Freq: Once | INTRAVENOUS | Status: AC
  Administered 2021-10-30: 150 mg via INTRAVENOUS
  Filled 2021-10-30: qty 100

## 2021-10-30 MED ORDER — AMIODARONE HCL 200 MG PO TABS
200.0000 mg | ORAL_TABLET | Freq: Two times a day (BID) | ORAL | Status: DC
  Administered 2021-10-30 – 2021-11-02 (×6): 200 mg via ORAL
  Filled 2021-10-30 (×8): qty 1

## 2021-10-30 NOTE — Progress Notes (Signed)
Rounding Note    Patient Name: Jon [name] Date of Encounter: 10/30/2021  [facility] Cardiologist: None   Subjective   OOB to chair, feels better today. Only complaint is that ankles are slightly swollen. No significant symptoms with AF.  Inpatient Medications    Scheduled Meds:  aspirin EC  81 mg Oral Daily   atorvastatin  80 mg Oral Daily   bisacodyl  10 mg Oral Daily   Or   bisacodyl  10 mg Rectal Daily   Chlorhexidine Gluconate Cloth  6 each Topical Daily   docusate sodium  200 mg Oral Daily   insulin aspart  0-24 Units Subcutaneous TID WC   melatonin  3 mg Oral QHS   midodrine  15 mg Oral TID WC   pantoprazole  40 mg Oral Daily   sodium chloride flush  3 mL Intravenous Q12H   Warfarin - Physician Dosing Inpatient   Does not apply q1600   Continuous Infusions:   PRN Meds: chlorproMAZINE, ondansetron (ZOFRAN) IV, mouth rinse, oxyCODONE, sodium chloride flush, sodium chloride flush, traMADol   Vital Signs    Vitals:   10/30/21 0621 10/30/21 0738 10/30/21 0739 10/30/21 0741  BP:  (!) 84/72 (!) 87/72 (!) 89/70  Pulse:  68 69 70  Resp:  '20 20 20  '$ Temp:    98.6 F (37 C)  TempSrc:    Oral  SpO2:  100% 97% 97%  Weight: 92.3 kg     Height:        Intake/Output Summary (Last 24 hours) at 10/30/2021 0921 Last data filed at 10/30/2021 0622 Gross per 24 hour  Intake 840 ml  Output 1800 ml  Net -960 ml      10/30/2021    6:21 AM 10/29/2021    4:36 AM 10/28/2021    5:00 AM  Last 3 Weights  Weight (lbs) 203 lb 8 oz 200 lb 8 oz 202 lb 13.2 oz  Weight (kg) 92.307 kg 90.946 kg 92 kg      Telemetry    SR, PACs, PVCs, AF with RVR. no bradycardia, no pacing - Personally Reviewed  ECG    Last was SR 80bpm, 1st degree AV block 220m, QRS has narrowed to 1046m- Personally Reviewed  Physical Exam   Unchanged exam today GEN: No acute distress.   Neck: No JVD Cardiac: RRR, no murmurs, rubs, or gallops.  Respiratory: slightly diminished at  the bases. GI: Soft, nontender, non-distended  MS: No edema; No deformity. Neuro:  Nonfocal  Psych: Normal affect   Labs    High Sensitivity Troponin:   Recent Labs  Lab 10/13/21 0714 10/13/21 0836 10/13/21 1150 10/13/21 1327 10/13/21 1854  TROPONINIHS 397* 1,101* 9,868* 9,340* 15,166*     Chemistry Recent Labs  Lab 10/23/21 1532 10/24/21 0350 10/26/21 0423 10/27/21 0459 10/28/21 0711 10/29/21 0932 10/30/21 0147  NA 134*   < > 134*   < > 139 139 138  K 5.6*   < > 4.3   < > 4.1 4.4 4.2  CL 104   < > 101   < > 108 108 105  CO2 19*   < > 25   < > '25 24 24  '$ GLUCOSE 199*   < > 132*   < > 177* 121* 122*  BUN 11   < > 21   < > '21 16 13  '$ CREATININE 1.34*   < > 1.39*   < > 1.44* 1.28* 1.11  CALCIUM 7.9*   < >  8.1*   < > 8.5* 8.5* 8.6*  MG 2.2  --  2.1  --   --   --   --   GFRNONAA 55*   < > 53*   < > 50* 58* >60  ANIONGAP 11   < > 8   < > '6 7 9   '$ < > = values in this interval not displayed.    Lipids No results for input(s): "CHOL", "TRIG", "HDL", "LABVLDL", "LDLCALC", "CHOLHDL" in the last 168 hours.  Hematology Recent Labs  Lab 10/26/21 0423 10/27/21 0459 10/30/21 0147  WBC 11.5* 10.5 10.7*  RBC 2.88* 2.79* 2.95*  HGB 8.5* 8.2* 8.7*  HCT 26.0* 24.4* 25.7*  MCV 90.3 87.5 87.1  MCH 29.5 29.4 29.5  MCHC 32.7 33.6 33.9  RDW 15.5 15.2 16.1*  PLT 318 350 537*   Thyroid No results for input(s): "TSH", "FREET4" in the last 168 hours.  BNPNo results for input(s): "BNP", "PROBNP" in the last 168 hours.  DDimer No results for input(s): "DDIMER" in the last 168 hours.   Radiology     Cardiac Studies   10/25/21: TTE 1. Left ventricular ejection fraction, by estimation, is 60 to 65%. Left  ventricular ejection fraction by PLAX is 60 %. The left ventricle has  normal function. The left ventricle has no regional wall motion  abnormalities. Left ventricular diastolic  function could not be evaluated.   2. Right ventricular systolic function is moderately reduced. The  right  ventricular size is mildly enlarged.   3. The mitral valve has been repaired/replaced. Trivial mitral valve  regurgitation. No evidence of mitral stenosis. The mean mitral valve  gradient is 4.0 mmHg. There is a 30 mm Carpentier-Adams prosthetic  annuloplasty ring present in the mitral  position. Procedure Date: 10/22/2021.   4. A small pericardial effusion is present. The pericardial effusion is  circumferential. There is no evidence of cardiac tamponade.   5. The aortic valve is tricuspid. There is severe calcifcation of the  aortic valve. There is mild thickening of the aortic valve. Aortic valve  regurgitation is not visualized. Mild aortic valve stenosis. Aortic valve  area, by VTI measures 1.92 cm.  Aortic valve mean gradient measures 6.0 mmHg.   Comparison(s): Changes from prior study are noted. 10/22/2021: LVEF 55%,  calcified trileaflet aortic valve, mitral valve repair.      10/22/2021: inter-op TEE Complications: No known complications during this procedure.  POST-OP IMPRESSIONS  _ Left Ventricle: has normal systolic function, with an ejection fraction  of  55%. The cavity size was normal. The wall motion is abnormal with regional  variation. Inferior wall severe hypokinesis is comparable to pre-bypass  function.  _ Right Ventricle: The right ventricle appears unchanged from pre-bypass.  _ Aorta: The aorta appears unchanged from pre-bypass.  _ Left Atrial Appendage: Has been surgically closed and the repair appears  to be  oversewn.  _ Aortic Valve: The aortic valve appears unchanged from pre-bypass. No  stenosis  present.  _ Mitral Valve: No regurgitation post repair. The gradient recorded across  the  prosthetic valve is within the expected range.Mitral valve repair with an  Edwards 30 mm Carpentier-Adams ischemic ring annuloplasty. Mean pressure  gradient by pulse wave doppler was 87mHg.. The mitral valve is status post  repair with an annuloplasty ring.  _  Tricuspid Valve: There is mild regurgitation.  _ Pulmonic Valve: The pulmonic valve appears unchanged from pre-bypass.  _ Interatrial Septum: The interatrial septum appears unchanged  from  pre-bypass.  _ Pericardium: The pericardium appears unchanged from pre-bypass.  _ Comments: Post-bypass images reviewed with surgeon.  PRE-OP FINDINGS   Left Ventricle: The left ventricle has normal systolic function, with an  ejection fraction of 60-65%. The cavity size was normal. There is mild  concentric left ventricular hypertrophy. Inferior and inferolateral severe  hypokinesis.    Right Ventricle: The right ventricle has normal systolic function. The  cavity was normal. There is no increase in right ventricular wall  thickness.   Left Atrium: Left atrial size was dilated. No left atrial/left atrial  appendage thrombus was detected. Left atrial appendage velocity is reduced  at less than 40 cm/s.   Right Atrium: Right atrial size was normal in size.   Interatrial Septum: No atrial level shunt detected by color flow Doppler.   Pericardium: There is no evidence of pericardial effusion.   Mitral Valve: The mitral valve is normal in structure. Mitral valve  regurgitation is severe by color flow Doppler. There is mild thickening  and mild calcification present.   Tricuspid Valve: The tricuspid valve was normal in structure. Tricuspid  valve regurgitation is trivial by color flow Doppler.   Aortic Valve: The aortic valve is tricuspid Aortic valve regurgitation was  not visualized by color flow Doppler. There is no stenosis of the aortic  valve.  There is severe calcifcation present. AVA by VTI measures 2.25 cm2.   Pulmonic Valve: The pulmonic valve was normal in structure.  Pulmonic valve regurgitation is not visualized by color flow Doppler.    Aorta: The aortic root, aortic arch and ascending aorta are normal in size  and structure. There is evidence of plaque in the descending aorta;  Grade  I, measuring 1-38m in size.   Pulmonary Artery: The pulmonary artery is of normal size.    10/15/2021: LHC Left Anterior Descending  Prox LAD to Mid LAD lesion is 90% stenosed. The lesion is severely calcified.  Mid LAD lesion is 80% stenosed. The lesion is calcified.    Right Coronary Artery  There is mild diffuse disease throughout the vessel. The RCA is ectatic, calcified, and tortuous throughout with mild to moderate diffuse plaquing  Prox RCA lesion is 50% stenosed.    Right Posterior Descending Artery  RPDA lesion is 60% stenosed.    Right Posterior Atrioventricular Artery  RPAV lesion is 95% stenosed. The lesion is thrombotic. The lesion was previously treated .        10/15/21 limited TTE 1. The aortic valve is calcified. There is severe calcifcation of the  aortic valve. Aortic valve regurgitation is not visualized. Mild aortic  valve stenosis. Aortic valve area, by VTI measures 1.92 cm. Aortic valve  mean gradient measures 9.0 mmHg.  Aortic valve Vmax measures 1.89 m/s.   Conclusion(s)/Recommendation(s): Limited study to assess aortic valve  stenosis. Stenosis is mild visually, by gradients, by dimensionless index,  and by valve area.      10/13/21: TTE 1. Left ventricular ejection fraction, by estimation, is 60 to 65%. The  left ventricle has normal function. The left ventricle demonstrates  regional wall motion abnormalities with basal inferior and basal  inferolateral severe hypokinesis. There is mild  concentric left ventricular hypertrophy. Left ventricular diastolic  parameters are consistent with Grade II diastolic dysfunction  (pseudonormalization).   2. Right ventricular systolic function is normal. The right ventricular  size is normal. Tricuspid regurgitation signal is inadequate for assessing  PA pressure.   3. The mitral valve is  normal in structure. Mild mitral valve  regurgitation. No evidence of mitral stenosis.   4. The aortic valve is  tricuspid. There is severe calcification of the  aortic valve. Aortic valve regurgitation is not visualized. There appears  to be some degree of aortic stenosis but the aortic valve was not fully  interrogated by doppler. Would  consider repeat limited echo with attention to aortic valve doppler  interrogation.   5. The inferior vena cava is normal in size with greater than 50%  respiratory variability, suggesting right atrial pressure of 3 mmHg.      Patient Profile     76 y.o. male  w/PMHx of AFib, HTN, DM, OSA w/CPAP, former smoker   admitted 10/13/21, progressive weakness, fatigue, initially HS Trop were only minimally elevated and he opted to leave AMA > on his way out became profoundly dizzy, brought to a room and Trops were up to 1100 >> 9868, EKG  without STEMI, but concern for inf/posterior MI.   TTE with preserved LVEF, cath with multivessel disease and failed attempt to intervene on RPLV and LAD, planned for CABG after Brlinita washout   10/22/21 CABG X 3 with the LIMA to the LAD and RSVG from the aorta to the PDA sequenced to the PLAT Radiofrequency Pulmonary vein isolation  maze ablation  Mitral valve repair with an Edwards 30 mm Carpentier-Adams ischemic ring annuloplasty  Closure of LA appendage  He has had some PAFlutte (PAFib is known for him) and some pacing as well, EP called in POD #4  Assessment & Plan    Post-op atrial fibrillation and flutter (though known for him at baseline to have Afib). Now with extended periods of RVR.      - plan for rhythm control since he had a MAZE procedure. Would start amiodarone. Can give IV load, then '200mg'$  PO BID for 1 week, then 200 daily until seen in follow-up. His sinus function appears to have improved, but he has pacing wires for back-up should bradycardia recur with amiodarone. I think he could be discharged if he does well through the IV amiodarone load.  Post op sinus bradycardia POD #8 with return of normal sinus  function CABG X 3 with the LIMA to the LAD and RSVG from the aorta to the PDA sequenced to the PLAT Radiofrequency Pulmonary vein isolation  maze ablation  Mitral valve repair with an Edwards 30 mm Carpentier-Adams ischemic ring annuloplasty  Closure of LA appendage  Off pressor, on high dose midodrine  I reviewed telemetry No pacing/bradycardia    For questions or updates, please contact [place] Please consult [email or web address] for contact info under        Signed, Melida Quitter, MD  10/30/2021, 9:21 AM

## 2021-10-30 NOTE — Progress Notes (Signed)
   10/30/21 0400  Assess: MEWS Score  Temp 98.7 F (37.1 C)  BP (!) 91/55  MAP (mmHg) 67  Pulse Rate (!) 131  ECG Heart Rate (!) 123  Resp 20  SpO2 97 %  O2 Device Room Air  Assess: MEWS Score  MEWS Temp 0  MEWS Systolic 1  MEWS Pulse 2  MEWS RR 0  MEWS LOC 0  MEWS Score 3  MEWS Score Color Yellow  Assess: if the MEWS score is Yellow or Red  Were vital signs taken at a resting state? Yes  Focused Assessment No change from prior assessment  Does the patient meet 2 or more of the SIRS criteria? No  MEWS guidelines implemented *See Row Information* No, previously yellow, continue vital signs every 4 hours  Treat  MEWS Interventions Administered scheduled meds/treatments  Pain Scale 0-10  Escalate  MEWS: Escalate Red: discuss with charge nurse/RN and provider, consider discussing with RRT  Notify: Charge Nurse/RN  Name of Charge Nurse/RN Notified Red  Date Charge Nurse/RN Notified 10/30/21  Time Charge Nurse/RN Notified 0424  Notify: Provider  Provider Name/Title Dr Roxan Hockey  Date Provider Notified 10/30/21  Time Provider Notified 0425  Notification Reason Other (Comment)  Provider response Other (Comment)  Assess: SIRS CRITERIA  SIRS Temperature  0  SIRS Pulse 1  SIRS Respirations  0  SIRS WBC 1  SIRS Score Sum  2

## 2021-10-30 NOTE — Progress Notes (Signed)
CARDIAC REHAB PHASE I   PRE:  Rate/Rhythm: 71 SR   BP:  Sitting: 93/68      SaO2: 98 RA  MODE:  Ambulation:  bed to chair   POST:  Rate/Rhythm: 88 SR   BP:  Sitting: 88/68 on side of bed       SaO2: 98 RA    Pt assisted to sitting in bed pt felt weak and little lightheaded b/p 88/68 once he sat for a few minutes he felt better. Assisted to standing and walked to chair with no further lightheadedness or weakness. B/p stable 104/71 once in chair. Pt has call bell and bedside table in reach. [facility] home education including sire care, home needs at discharge, heart healthy diabetic diet, IS use at home, sternal precautions, risk factors, exercise guidelines, restrictions and CRP2 reviewed with pt. All questions and concerns addressed. Will refer to [facility] for CRP2. Will continue to follow.   [id number] Vanessa Barbara, RN BSN 10/30/2021 11:16 AM

## 2021-10-30 NOTE — Progress Notes (Addendum)
[address]       [place] [address]             [phone]      8 Days Post-Op Procedure(s) (LRB): CORONARY ARTERY BYPASS GRAFTING (CABG) X THREE, USING LEFT INTERNAL MAMMARY ARTERY AND RIGHT LEG GREATER SAPHENOUS VEIN HARVESTED ENDOSCOPICALLY (N/A) MAZE (N/A) TRANSESOPHAGEAL ECHOCARDIOGRAM (TEE) (N/A) MITRAL VALVE REPAIR (MVR) USING MCCARTHY-ADAMS RING SIZE 30MM (N/A) Subjective: Feels generally well, BP soft at times  Objective: Vital signs in last 24 hours: Temp:  [97.5 F (36.4 C)-99.6 F (37.6 C)] 98.7 F (37.1 C) (09/30 0400) Pulse Rate:  [68-135] 70 (09/30 0741) Cardiac Rhythm: Atrial fibrillation (09/29 1938) Resp:  [18-20] 20 (09/30 0741) BP: (84-117)/(55-90) 89/70 (09/30 0741) SpO2:  [96 %-100 %] 97 % (09/30 0741) Weight:  [92.3 kg] 92.3 kg (09/30 0621)  Hemodynamic parameters for last 24 hours:    Intake/Output from previous day: 09/29 0701 - 09/30 0700 In: 840 [P.O.:840] Out: 2150 [Urine:1550; Stool:600] Intake/Output this shift: No intake/output data recorded.  General appearance: alert, cooperative, and no distress Heart: regular rate and rhythm Lungs: clear to auscultation bilaterally Abdomen: benign Extremities: no edema Wound: incis healing well  Lab Results: Recent Labs    10/30/21 0147  WBC 10.7*  HGB 8.7*  HCT 25.7*  PLT 537*   BMET:  Recent Labs    10/29/21 0932 10/30/21 0147  NA 139 138  K 4.4 4.2  CL 108 105  CO2 24 24  GLUCOSE 121* 122*  BUN 16 13  CREATININE 1.28* 1.11  CALCIUM 8.5* 8.6*    PT/INR:  Recent Labs    10/30/21 0147  LABPROT 22.1*  INR 2.0*   ABG    Component Value Date/Time   PHART 7.293 (L) 10/22/2021 2240   HCO3 18.4 (L) 10/22/2021 2240   TCO2 20 (L) 10/22/2021 2240   ACIDBASEDEF 7.0 (H) 10/22/2021 2240   O2SAT 97 10/22/2021 2240   CBG (last 3)  Recent Labs    10/29/21 1747 10/29/21 2132 10/30/21 0614  GLUCAP 156* 103* 122*    Meds Scheduled Meds:  aspirin  EC  81 mg Oral Daily   atorvastatin  80 mg Oral Daily   bisacodyl  10 mg Oral Daily   Or   bisacodyl  10 mg Rectal Daily   Chlorhexidine Gluconate Cloth  6 each Topical Daily   docusate sodium  200 mg Oral Daily   insulin aspart  0-24 Units Subcutaneous TID WC   melatonin  3 mg Oral QHS   midodrine  15 mg Oral TID WC   pantoprazole  40 mg Oral Daily   sodium chloride flush  3 mL Intravenous Q12H   Warfarin - Physician Dosing Inpatient   Does not apply q1600   Continuous Infusions: PRN Meds:.chlorproMAZINE, ondansetron (ZOFRAN) IV, mouth rinse, oxyCODONE, sodium chloride flush, sodium chloride flush, traMADol  Xrays No results found.  Assessment/Plan: S/P Procedure(s) (LRB): CORONARY ARTERY BYPASS GRAFTING (CABG) X THREE, USING LEFT INTERNAL MAMMARY ARTERY AND RIGHT LEG GREATER SAPHENOUS VEIN HARVESTED ENDOSCOPICALLY (N/A) MAZE (N/A) TRANSESOPHAGEAL ECHOCARDIOGRAM (TEE) (N/A) MITRAL VALVE REPAIR (MVR) USING MCCARTHY-ADAMS RING SIZE 30MM (N/A)  1 Tmax  99.6, S BP soft at times into 80's, afib and sinus, frequent vent ectopy,  HR quite variable, upto 130's 2 O2 sats ok on RA 3 good UOP, voiding , normal renal fxn 4 H/H stable 5 thrombocytosis- platelets 537K 6 INR 2.0 - will hold on removing wires until seen  by EP, have spoken with Dr Myles Gip      LOS: 17 days    John Giovanni PA-C Pager [phone] 10/30/2021   Patient seen and examined, agree with above Cardiology plans to start amiodarone  Remo Lipps C. Roxan Hockey, MD [facility] [phone]

## 2021-10-30 NOTE — Progress Notes (Signed)
Occupational Therapy Treatment Patient Details Name: Jon [name] MRN: [id number] DOB: 1946-01-28 Today's Date: 10/30/2021   History of present illness Pt is a 76 y/o male admitted 10/13/21 with CP and lightheadedness. Found to have A-fib with SSS, symptomatic bradycardia, STEMI s/p unsuccessful PCI 9/13. 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 Pt with post-op bradycardia. PMH includes A-fib, DM, glaucoma, HTN, OSA.   OT comments  Patient with continued progression toward patient focused goals.  Good adherence to sternal precautions, and is very close to needing generalized supervision for in room mobility/ADL performance.  Able to figure four, and expresses discomfort, but no real pain.  OT to continue efforts in the acute setting.      Recommendations for follow up therapy are one component of a multi-disciplinary discharge planning process, led by the attending physician.  Recommendations may be updated based on patient status, additional functional criteria and insurance authorization.    Follow Up Recommendations  Home health OT    Assistance Recommended at Discharge Frequent or constant Supervision/Assistance  Patient can return home with the following  A little help with walking and/or transfers;A little help with bathing/dressing/bathroom;Assistance with cooking/housework;Assist for transportation;Help with stairs or ramp for entrance   Equipment Recommendations  None recommended by OT    Recommendations for Other Services      Precautions / Restrictions Precautions Precautions: Fall;Sternal Restrictions RUE Weight Bearing: Partial weight bearing LUE Weight Bearing: Partial weight bearing Other Position/Activity Restrictions: external pacer       Mobility Bed Mobility               General bed mobility comments: OOB in recliner    Transfers Overall transfer level: Needs assistance Equipment used: Rolling walker (2 wheels) Transfers: Sit to/from Stand Sit to Stand:  Min guard                 Balance           Standing balance support: Reliant on assistive device for balance Standing balance-Leahy Scale: Fair                             ADL either performed or assessed with clinical judgement   ADL       Grooming: Wash/dry hands;Standing;Supervision/safety                   Toilet Transfer: Rolling walker (2 wheels);Ambulation;Supervision/safety;Regular Toilet   Toileting- Clothing Manipulation and Hygiene: Supervision/safety              Extremity/Trunk Assessment                                Cognition Arousal/Alertness: Awake/alert Behavior During Therapy: WFL for tasks assessed/performed Overall Cognitive Status: Within Functional Limits for tasks assessed                                          Exercises      Shoulder Instructions       General Comments  HR 84 and below with acitivity.  77 at rest.    Pertinent Vitals/ Pain       Pain Assessment Pain Assessment: Faces Faces Pain Scale: Hurts a little bit Pain Location: sternum Pain Descriptors / Indicators: Tender Pain Intervention(s): Monitored during session  Frequency  Min 2X/week        Progress Toward Goals  OT Goals(current goals can now be found in the care plan section)  Progress towards OT goals: Progressing toward goals  Acute Rehab OT Goals OT Goal Formulation: With patient Time For Goal Achievement: 11/12/21 Potential to Achieve Goals: Good  Plan Discharge plan remains appropriate    Co-evaluation                 AM-PAC OT "6 Clicks" Daily Activity     Outcome Measure   Help from another person eating meals?: None Help from another person taking care of personal grooming?: A Little Help from another person toileting, which includes using toliet, bedpan, or urinal?: A Little Help from  another person bathing (including washing, rinsing, drying)?: A Little Help from another person to put on and taking off regular upper body clothing?: None Help from another person to put on and taking off regular lower body clothing?: A Little 6 Click Score: 20    End of Session Equipment Utilized During Treatment: Rolling walker (2 wheels)  OT Visit Diagnosis: Unsteadiness on feet (R26.81);Other abnormalities of gait and mobility (R26.89);Muscle weakness (generalized) (M62.81)   Activity Tolerance Patient tolerated treatment well   Patient Left in chair;with call bell/phone within reach   Nurse Communication Mobility status        Time: 1430-1451 OT Time Calculation (min): 21 min  Charges: OT General Charges $OT Visit: 1 Visit OT Treatments $Self Care/Home Management : 8-22 mins  10/30/2021  RP, OTR/L  Acute Rehabilitation Services  Office:  [phone]   Metta Clines 10/30/2021, 2:56 PM

## 2021-10-31 DIAGNOSIS — R9431 Abnormal electrocardiogram [ECG] [EKG]: Secondary | ICD-10-CM | POA: Diagnosis not present

## 2021-10-31 LAB — GLUCOSE, CAPILLARY
Glucose-Capillary: 150 mg/dL — ABNORMAL HIGH (ref 70–99)
Glucose-Capillary: 179 mg/dL — ABNORMAL HIGH (ref 70–99)
Glucose-Capillary: 109 mg/dL — ABNORMAL HIGH (ref 70–99)
Glucose-Capillary: 151 mg/dL — ABNORMAL HIGH (ref 70–99)

## 2021-10-31 LAB — PROTIME-INR
INR: 1.8 — ABNORMAL HIGH (ref 0.8–1.2)
Prothrombin Time: 20.4 seconds — ABNORMAL HIGH (ref 11.4–15.2)

## 2021-10-31 MED ORDER — WARFARIN SODIUM 2.5 MG PO TABS
2.5000 mg | ORAL_TABLET | Freq: Once | ORAL | Status: AC
  Administered 2021-10-31: 2.5 mg via ORAL
  Filled 2021-10-31: qty 1

## 2021-10-31 NOTE — Progress Notes (Signed)
Patient will self place CPAP when ready, informed RN to call RT if patient needs assistance.

## 2021-10-31 NOTE — Progress Notes (Addendum)
Rounding Note    Patient Name: Jon [name] Date of Encounter: 10/31/2021  [facility] Cardiologist: None   Subjective   OOB to chair, feels better today. No complaints today. No significant symptoms with AF.  Events: amiodarone started yesterday. Rates seem better controlled.  He converted to sinus rhythm as I was speaking with him this morning.  Inpatient Medications    Scheduled Meds:  amiodarone  200 mg Oral BID   aspirin EC  81 mg Oral Daily   atorvastatin  80 mg Oral Daily   bisacodyl  10 mg Oral Daily   Or   bisacodyl  10 mg Rectal Daily   Chlorhexidine Gluconate Cloth  6 each Topical Daily   docusate sodium  200 mg Oral Daily   insulin aspart  0-24 Units Subcutaneous TID WC   melatonin  3 mg Oral QHS   midodrine  15 mg Oral TID WC   pantoprazole  40 mg Oral Daily   sodium chloride flush  3 mL Intravenous Q12H   warfarin  2.5 mg Oral ONCE-1600   Warfarin - Physician Dosing Inpatient   Does not apply q1600   Continuous Infusions:   PRN Meds: chlorproMAZINE, ondansetron (ZOFRAN) IV, mouth rinse, oxyCODONE, sodium chloride flush, sodium chloride flush, traMADol   Vital Signs    Vitals:   10/30/21 2125 10/30/21 2324 10/30/21 2342 10/31/21 0500  BP: 91/67 92/61  (!) 91/59  Pulse: 65 62 64 62  Resp: '16 20 18 20  '$ Temp: 98.7 F (37.1 C) 97.8 F (36.6 C)  98.3 F (36.8 C)  TempSrc: Oral Oral  Oral  SpO2: 98% 97% 98% 95%  Weight:    93 kg  Height:        Intake/Output Summary (Last 24 hours) at 10/31/2021 0814 Last data filed at 10/31/2021 0505 Gross per 24 hour  Intake 333.08 ml  Output 850 ml  Net -516.92 ml      10/31/2021    5:00 AM 10/30/2021    6:21 AM 10/29/2021    4:36 AM  Last 3 Weights  Weight (lbs) 205 lb 203 lb 8 oz 200 lb 8 oz  Weight (kg) 92.987 kg 92.307 kg 90.946 kg      Telemetry    SR, PACs, PVCs, AF with RVR. no bradycardia, no pacing - Personally Reviewed  ECG    Last was SR 80bpm, 1st degree AV block  249m, QRS has narrowed to 1015m- Personally Reviewed  Physical Exam   Unchanged exam today GEN: No acute distress.   Neck: No JVD Cardiac: RRR, no murmurs, rubs, or gallops.  Respiratory: slightly diminished at the bases. GI: Soft, nontender, non-distended  MS: No edema; No deformity. Neuro:  Nonfocal  Psych: Normal affect   Labs    High Sensitivity Troponin:   Recent Labs  Lab 10/13/21 0714 10/13/21 0836 10/13/21 1150 10/13/21 1327 10/13/21 1854  TROPONINIHS 397* 1,101* 9,868* 9,340* 15,166*     Chemistry Recent Labs  Lab 10/26/21 0423 10/27/21 0459 10/28/21 0711 10/29/21 0932 10/30/21 0147  NA 134*   < > 139 139 138  K 4.3   < > 4.1 4.4 4.2  CL 101   < > 108 108 105  CO2 25   < > '25 24 24  '$ GLUCOSE 132*   < > 177* 121* 122*  BUN 21   < > '21 16 13  '$ CREATININE 1.39*   < > 1.44* 1.28* 1.11  CALCIUM 8.1*   < >  8.5* 8.5* 8.6*  MG 2.1  --   --   --   --   GFRNONAA 53*   < > 50* 58* >60  ANIONGAP 8   < > '6 7 9   '$ < > = values in this interval not displayed.    Lipids No results for input(s): "CHOL", "TRIG", "HDL", "LABVLDL", "LDLCALC", "CHOLHDL" in the last 168 hours.  Hematology Recent Labs  Lab 10/26/21 0423 10/27/21 0459 10/30/21 0147  WBC 11.5* 10.5 10.7*  RBC 2.88* 2.79* 2.95*  HGB 8.5* 8.2* 8.7*  HCT 26.0* 24.4* 25.7*  MCV 90.3 87.5 87.1  MCH 29.5 29.4 29.5  MCHC 32.7 33.6 33.9  RDW 15.5 15.2 16.1*  PLT 318 350 537*   Thyroid No results for input(s): "TSH", "FREET4" in the last 168 hours.  BNPNo results for input(s): "BNP", "PROBNP" in the last 168 hours.  DDimer No results for input(s): "DDIMER" in the last 168 hours.   Radiology     Cardiac Studies   10/25/21: TTE 1. Left ventricular ejection fraction, by estimation, is 60 to 65%. Left  ventricular ejection fraction by PLAX is 60 %. The left ventricle has  normal function. The left ventricle has no regional wall motion  abnormalities. Left ventricular diastolic  function could not be  evaluated.   2. Right ventricular systolic function is moderately reduced. The right  ventricular size is mildly enlarged.   3. The mitral valve has been repaired/replaced. Trivial mitral valve  regurgitation. No evidence of mitral stenosis. The mean mitral valve  gradient is 4.0 mmHg. There is a 30 mm Carpentier-Adams prosthetic  annuloplasty ring present in the mitral  position. Procedure Date: 10/22/2021.   4. A small pericardial effusion is present. The pericardial effusion is  circumferential. There is no evidence of cardiac tamponade.   5. The aortic valve is tricuspid. There is severe calcifcation of the  aortic valve. There is mild thickening of the aortic valve. Aortic valve  regurgitation is not visualized. Mild aortic valve stenosis. Aortic valve  area, by VTI measures 1.92 cm.  Aortic valve mean gradient measures 6.0 mmHg.   Comparison(s): Changes from prior study are noted. 10/22/2021: LVEF 55%,  calcified trileaflet aortic valve, mitral valve repair.      10/22/2021: inter-op TEE Complications: No known complications during this procedure.  POST-OP IMPRESSIONS  _ Left Ventricle: has normal systolic function, with an ejection fraction  of  55%. The cavity size was normal. The wall motion is abnormal with regional  variation. Inferior wall severe hypokinesis is comparable to pre-bypass  function.  _ Right Ventricle: The right ventricle appears unchanged from pre-bypass.  _ Aorta: The aorta appears unchanged from pre-bypass.  _ Left Atrial Appendage: Has been surgically closed and the repair appears  to be  oversewn.  _ Aortic Valve: The aortic valve appears unchanged from pre-bypass. No  stenosis  present.  _ Mitral Valve: No regurgitation post repair. The gradient recorded across  the  prosthetic valve is within the expected range.Mitral valve repair with an  Edwards 30 mm Carpentier-Adams ischemic ring annuloplasty. Mean pressure  gradient by pulse wave doppler was  44mHg.. The mitral valve is status post  repair with an annuloplasty ring.  _ Tricuspid Valve: There is mild regurgitation.  _ Pulmonic Valve: The pulmonic valve appears unchanged from pre-bypass.  _ Interatrial Septum: The interatrial septum appears unchanged from  pre-bypass.  _ Pericardium: The pericardium appears unchanged from pre-bypass.  _ Comments: Post-bypass images reviewed with  Psychologist, sport and exercise.  PRE-OP FINDINGS   Left Ventricle: The left ventricle has normal systolic function, with an  ejection fraction of 60-65%. The cavity size was normal. There is mild  concentric left ventricular hypertrophy. Inferior and inferolateral severe  hypokinesis.    Right Ventricle: The right ventricle has normal systolic function. The  cavity was normal. There is no increase in right ventricular wall  thickness.   Left Atrium: Left atrial size was dilated. No left atrial/left atrial  appendage thrombus was detected. Left atrial appendage velocity is reduced  at less than 40 cm/s.   Right Atrium: Right atrial size was normal in size.   Interatrial Septum: No atrial level shunt detected by color flow Doppler.   Pericardium: There is no evidence of pericardial effusion.   Mitral Valve: The mitral valve is normal in structure. Mitral valve  regurgitation is severe by color flow Doppler. There is mild thickening  and mild calcification present.   Tricuspid Valve: The tricuspid valve was normal in structure. Tricuspid  valve regurgitation is trivial by color flow Doppler.   Aortic Valve: The aortic valve is tricuspid Aortic valve regurgitation was  not visualized by color flow Doppler. There is no stenosis of the aortic  valve.  There is severe calcifcation present. AVA by VTI measures 2.25 cm2.   Pulmonic Valve: The pulmonic valve was normal in structure.  Pulmonic valve regurgitation is not visualized by color flow Doppler.    Aorta: The aortic root, aortic arch and ascending aorta are  normal in size  and structure. There is evidence of plaque in the descending aorta; Grade  I, measuring 1-46m in size.   Pulmonary Artery: The pulmonary artery is of normal size.    10/15/2021: LHC Left Anterior Descending  Prox LAD to Mid LAD lesion is 90% stenosed. The lesion is severely calcified.  Mid LAD lesion is 80% stenosed. The lesion is calcified.    Right Coronary Artery  There is mild diffuse disease throughout the vessel. The RCA is ectatic, calcified, and tortuous throughout with mild to moderate diffuse plaquing  Prox RCA lesion is 50% stenosed.    Right Posterior Descending Artery  RPDA lesion is 60% stenosed.    Right Posterior Atrioventricular Artery  RPAV lesion is 95% stenosed. The lesion is thrombotic. The lesion was previously treated .        10/15/21 limited TTE 1. The aortic valve is calcified. There is severe calcifcation of the  aortic valve. Aortic valve regurgitation is not visualized. Mild aortic  valve stenosis. Aortic valve area, by VTI measures 1.92 cm. Aortic valve  mean gradient measures 9.0 mmHg.  Aortic valve Vmax measures 1.89 m/s.   Conclusion(s)/Recommendation(s): Limited study to assess aortic valve  stenosis. Stenosis is mild visually, by gradients, by dimensionless index,  and by valve area.      10/13/21: TTE 1. Left ventricular ejection fraction, by estimation, is 60 to 65%. The  left ventricle has normal function. The left ventricle demonstrates  regional wall motion abnormalities with basal inferior and basal  inferolateral severe hypokinesis. There is mild  concentric left ventricular hypertrophy. Left ventricular diastolic  parameters are consistent with Grade II diastolic dysfunction  (pseudonormalization).   2. Right ventricular systolic function is normal. The right ventricular  size is normal. Tricuspid regurgitation signal is inadequate for assessing  PA pressure.   3. The mitral valve is normal in structure. Mild  mitral valve  regurgitation. No evidence of mitral stenosis.   4. The aortic  valve is tricuspid. There is severe calcification of the  aortic valve. Aortic valve regurgitation is not visualized. There appears  to be some degree of aortic stenosis but the aortic valve was not fully  interrogated by doppler. Would  consider repeat limited echo with attention to aortic valve doppler  interrogation.   5. The inferior vena cava is normal in size with greater than 50%  respiratory variability, suggesting right atrial pressure of 3 mmHg.      Patient Profile     76 y.o. male  w/PMHx of AFib, HTN, DM, OSA w/CPAP, former smoker   admitted 10/13/21, progressive weakness, fatigue, initially HS Trop were only minimally elevated and he opted to leave AMA > on his way out became profoundly dizzy, brought to a room and Trops were up to 1100 >> 9868, EKG  without STEMI, but concern for inf/posterior MI.   TTE with preserved LVEF, cath with multivessel disease and failed attempt to intervene on RPLV and LAD, planned for CABG after [place] washout   10/22/21 CABG X 3 with the LIMA to the LAD and RSVG from the aorta to the PDA sequenced to the PLAT Radiofrequency Pulmonary vein isolation  maze ablation  Mitral valve repair with an Edwards 30 mm Carpentier-Adams ischemic ring annuloplasty  Closure of LA appendage  He has had some PAFlutte (PAFib is known for him) and some pacing as well, EP called in POD #4  Assessment & Plan    Post-op atrial fibrillation and flutter (though known for him at baseline to have Afib). Amiodarone started due to AF and RVR.     - plan for rhythm control post MAZE procedure. Amiodarone '200mg'$  PO BID for 1 week, then 200 daily until seen in follow-up.   Post op sinus bradycardia POD #9 with return of normal sinus function CABG X 3 with the LIMA to the LAD and RSVG from the aorta to the PDA sequenced to the PLAT Radiofrequency Pulmonary vein isolation  maze ablation  Mitral  valve repair with an Edwards 30 mm Carpentier-Adams ischemic ring annuloplasty.   Closure of LA appendage  Off pressor, on high dose midodrine  I reviewed telemetry No pacing/bradycardia  Since he has converted back to sinus, I will sign off. Please contact me with Epic Secure chat with questions or if new issues arise.   For questions or updates, please contact [place] Please consult [email or web address] for contact info under        Signed, Melida Quitter, MD  10/31/2021, 8:14 AM

## 2021-10-31 NOTE — Progress Notes (Signed)
Pt epicardial pacing wires removed per MD order.  No complications to report at this time.  Pt tolerated well.  Pt placed on bed rest with vital checks per protocol.

## 2021-10-31 NOTE — Progress Notes (Addendum)
[address]       [place] [address]             [phone]      9 Days Post-Op Procedure(s) (LRB): CORONARY ARTERY BYPASS GRAFTING (CABG) X THREE, USING LEFT INTERNAL MAMMARY ARTERY AND RIGHT LEG GREATER SAPHENOUS VEIN HARVESTED ENDOSCOPICALLY (N/A) MAZE (N/A) TRANSESOPHAGEAL ECHOCARDIOGRAM (TEE) (N/A) MITRAL VALVE REPAIR (MVR) USING MCCARTHY-ADAMS RING SIZE 30MM (N/A) Subjective: Light headed with ambulation  Objective: Vital signs in last 24 hours: Temp:  [97.8 F (36.6 C)-99.6 F (37.6 C)] 98.3 F (36.8 C) (10/01 0500) Pulse Rate:  [62-100] 62 (10/01 0500) Cardiac Rhythm: Normal sinus rhythm (09/30 1900) Resp:  [16-20] 20 (10/01 0500) BP: (84-99)/(59-74) 91/59 (10/01 0500) SpO2:  [92 %-100 %] 95 % (10/01 0500) Weight:  [93 kg] 93 kg (10/01 0500)  Hemodynamic parameters for last 24 hours:    Intake/Output from previous day: 09/30 0701 - 10/01 0700 In: 333.1 [P.O.:240; I.V.:93.1] Out: 850 [Urine:850] Intake/Output this shift: No intake/output data recorded.  General appearance: alert, cooperative, and no distress Heart: regular rate and rhythm Lungs: mildly dim in bases Abdomen: benign Extremities: some left LE edema Wound: incis healing well  Lab Results: Recent Labs    10/30/21 0147  WBC 10.7*  HGB 8.7*  HCT 25.7*  PLT 537*   BMET:  Recent Labs    10/29/21 0932 10/30/21 0147  NA 139 138  K 4.4 4.2  CL 108 105  CO2 24 24  GLUCOSE 121* 122*  BUN 16 13  CREATININE 1.28* 1.11  CALCIUM 8.5* 8.6*    PT/INR:  Recent Labs    10/31/21 0232  LABPROT 20.4*  INR 1.8*   ABG    Component Value Date/Time   PHART 7.293 (L) 10/22/2021 2240   HCO3 18.4 (L) 10/22/2021 2240   TCO2 20 (L) 10/22/2021 2240   ACIDBASEDEF 7.0 (H) 10/22/2021 2240   O2SAT 97 10/22/2021 2240   CBG (last 3)  Recent Labs    10/30/21 1630 10/30/21 2121 10/31/21 0640  GLUCAP 101* 120* 150*    Meds Scheduled Meds:  amiodarone  200 mg Oral BID    aspirin EC  81 mg Oral Daily   atorvastatin  80 mg Oral Daily   bisacodyl  10 mg Oral Daily   Or   bisacodyl  10 mg Rectal Daily   Chlorhexidine Gluconate Cloth  6 each Topical Daily   docusate sodium  200 mg Oral Daily   insulin aspart  0-24 Units Subcutaneous TID WC   melatonin  3 mg Oral QHS   midodrine  15 mg Oral TID WC   pantoprazole  40 mg Oral Daily   sodium chloride flush  3 mL Intravenous Q12H   Warfarin - Physician Dosing Inpatient   Does not apply q1600   Continuous Infusions: PRN Meds:.chlorproMAZINE, ondansetron (ZOFRAN) IV, mouth rinse, oxyCODONE, sodium chloride flush, sodium chloride flush, traMADol  Xrays No results found.  Assessment/Plan: S/P Procedure(s) (LRB): CORONARY ARTERY BYPASS GRAFTING (CABG) X THREE, USING LEFT INTERNAL MAMMARY ARTERY AND RIGHT LEG GREATER SAPHENOUS VEIN HARVESTED ENDOSCOPICALLY (N/A) MAZE (N/A) TRANSESOPHAGEAL ECHOCARDIOGRAM (TEE) (N/A) MITRAL VALVE REPAIR (MVR) USING MCCARTHY-ADAMS RING SIZE 30MM (N/A)   1 afeb, s BP 84-99, sinus rhythm, PVC's, now on amiodarone 2 O2 sats good on RA 3 fair UOP, not sure if all is recorded 3 INR 1.8 4 will d/c wires today and resume coumadin 5 he believes his brother or sister can stay  with him and should know today what the home situation will be.  6 concern- BP even with midodrine leaves him dizzy/lightheaded at times- usually resolves if he is patient with activity but will need someone close by to lower risk of fall, PT/OT for home will be arranged 7 poss home in am 8 repeat CXR and labs in am       LOS: 18 days    John Giovanni PA-c Pager [phone] 10/31/2021   Patient seen and examined, agree with above Converted to Sr with amiodarone BP remains borderline on midodrine Mild edema- will hold off on diuretic due to low BP  Remo Lipps C. Roxan Hockey, MD [facility] [phone]

## 2021-11-01 ENCOUNTER — Ambulatory Visit: Payer: Medicare Other

## 2021-11-01 ENCOUNTER — Inpatient Hospital Stay (HOSPITAL_COMMUNITY): Payer: Medicare Other

## 2021-11-01 LAB — CBC
HCT: 25.1 % — ABNORMAL LOW (ref 39.0–52.0)
Hemoglobin: 8.2 g/dL — ABNORMAL LOW (ref 13.0–17.0)
MCH: 28.6 pg (ref 26.0–34.0)
MCHC: 32.7 g/dL (ref 30.0–36.0)
MCV: 87.5 fL (ref 80.0–100.0)
Platelets: 652 10*3/uL — ABNORMAL HIGH (ref 150–400)
RBC: 2.87 MIL/uL — ABNORMAL LOW (ref 4.22–5.81)
RDW: 15.9 % — ABNORMAL HIGH (ref 11.5–15.5)
WBC: 11.2 10*3/uL — ABNORMAL HIGH (ref 4.0–10.5)
nRBC: 0.5 % — ABNORMAL HIGH (ref 0.0–0.2)

## 2021-11-01 LAB — BASIC METABOLIC PANEL
Anion gap: 8 (ref 5–15)
BUN: 15 mg/dL (ref 8–23)
CO2: 24 mmol/L (ref 22–32)
Calcium: 8.2 mg/dL — ABNORMAL LOW (ref 8.9–10.3)
Chloride: 105 mmol/L (ref 98–111)
Creatinine, Ser: 1.36 mg/dL — ABNORMAL HIGH (ref 0.61–1.24)
GFR, Estimated: 54 mL/min — ABNORMAL LOW (ref >60)
Glucose, Bld: 108 mg/dL — ABNORMAL HIGH (ref 70–99)
Potassium: 4 mmol/L (ref 3.5–5.1)
Sodium: 137 mmol/L (ref 135–145)

## 2021-11-01 LAB — GLUCOSE, CAPILLARY
Glucose-Capillary: 193 mg/dL — ABNORMAL HIGH (ref 70–99)
Glucose-Capillary: 109 mg/dL — ABNORMAL HIGH (ref 70–99)
Glucose-Capillary: 143 mg/dL — ABNORMAL HIGH (ref 70–99)
Glucose-Capillary: 138 mg/dL — ABNORMAL HIGH (ref 70–99)

## 2021-11-01 LAB — PROTIME-INR
INR: 1.7 — ABNORMAL HIGH (ref 0.8–1.2)
Prothrombin Time: 19.4 seconds — ABNORMAL HIGH (ref 11.4–15.2)

## 2021-11-01 MED ORDER — FUROSEMIDE 40 MG PO TABS
40.0000 mg | ORAL_TABLET | Freq: Once | ORAL | Status: AC
  Administered 2021-11-01: 40 mg via ORAL
  Filled 2021-11-01: qty 1

## 2021-11-01 MED ORDER — WARFARIN SODIUM 2.5 MG PO TABS
2.5000 mg | ORAL_TABLET | Freq: Once | ORAL | Status: AC
  Administered 2021-11-01: 2.5 mg via ORAL
  Filled 2021-11-01: qty 1

## 2021-11-01 NOTE — Progress Notes (Addendum)
[address]       [place] [address]             [phone]       10 Days Post-Op Procedure(s) (LRB): CORONARY ARTERY BYPASS GRAFTING (CABG) X THREE, USING LEFT INTERNAL MAMMARY ARTERY AND RIGHT LEG GREATER SAPHENOUS VEIN HARVESTED ENDOSCOPICALLY (N/A) MAZE (N/A) TRANSESOPHAGEAL ECHOCARDIOGRAM (TEE) (N/A) MITRAL VALVE REPAIR (MVR) USING MCCARTHY-ADAMS RING SIZE 30MM (N/A) Subjective: Patient states he has dizziness when first getting up in the morning and his legs feel heavy due to swelling.   Objective: Vital signs in last 24 hours: Temp:  [97.6 F (36.4 C)-99.3 F (37.4 C)] 97.6 F (36.4 C) (10/01 2258) Pulse Rate:  [65-99] 65 (10/01 2258) Cardiac Rhythm: Normal sinus rhythm (10/01 1929) Resp:  [20] 20 (10/01 2258) BP: (86-106)/(66-77) 106/77 (10/01 2258) SpO2:  [95 %-96 %] 96 % (10/01 2258)  Hemodynamic parameters for last 24 hours:    Intake/Output from previous day: 10/01 0701 - 10/02 0700 In: 270 [P.O.:270] Out: 975 [Urine:975] Intake/Output this shift: No intake/output data recorded.  General appearance: alert, cooperative, and no distress Neurologic: intact Heart: Regular rate and rhythm, ventricular ectopy Lungs: Slightly diminished BS left basilar, otherwise clear to auscultation Abdomen: soft, non-tender; bowel sounds normal; no masses,  no organomegaly Extremities: Mildly edematous Wound: Clean, dry, intact  Lab Results: Recent Labs    10/30/21 0147 11/01/21 0154  WBC 10.7* 11.2*  HGB 8.7* 8.2*  HCT 25.7* 25.1*  PLT 537* 652*   BMET:  Recent Labs    10/30/21 0147 11/01/21 0154  NA 138 137  K 4.2 4.0  CL 105 105  CO2 24 24  GLUCOSE 122* 108*  BUN 13 15  CREATININE 1.11 1.36*  CALCIUM 8.6* 8.2*    PT/INR:  Recent Labs    11/01/21 0154  LABPROT 19.4*  INR 1.7*   ABG    Component Value Date/Time   PHART 7.293 (L) 10/22/2021 2240   HCO3 18.4 (L) 10/22/2021 2240   TCO2 20 (L) 10/22/2021 2240    ACIDBASEDEF 7.0 (H) 10/22/2021 2240   O2SAT 97 10/22/2021 2240   CBG (last 3)  Recent Labs    10/31/21 1121 10/31/21 1700 10/31/21 2015  GLUCAP 179* 109* 151*    Assessment/Plan: S/P Procedure(s) (LRB): CORONARY ARTERY BYPASS GRAFTING (CABG) X THREE, USING LEFT INTERNAL MAMMARY ARTERY AND RIGHT LEG GREATER SAPHENOUS VEIN HARVESTED ENDOSCOPICALLY (N/A) MAZE (N/A) TRANSESOPHAGEAL ECHOCARDIOGRAM (TEE) (N/A) MITRAL VALVE REPAIR (MVR) USING MCCARTHY-ADAMS RING SIZE 30MM (N/A)  CV: Post op a fib/flutter. Sinus rhythm this morning with frequent PVCs, rate in the 70's. On amiodarone '200mg'$  BID. Soft blood pressure SBP 80's-100's, likely causing dizziness. On midodrine '15mg'$  TID. Coumadin 2.'5mg'$  INR 1.7 today. Pulm: 96% on RA. CXR shows bibasilar atelectasis and possible small left pleural effusion. Continue IS and ambulation.  GI: +BM, good appetite Endo: DM2, CBGs controlled 179/109/151. Will restart home meds at d/c Renal: Cr. 1.36. Volume overload, Wt +7kg today. Likely hold diuresis due to low BP and AKI Thrombocytosis and leukocytosis: plt 652,000 today and WBC stable 11.2 today, no sign of infection at wounds Postoperative ABLA: stable H/H 8.2/25.1 Dispo: Soft blood pressure on midodrine '15mg'$  TID, in sinus rhythm on amiodarone '200mg'$  BID. Likely d/c home today or tomorrow with home health PT/OT. Will d/c on amiodarone and midodrine taper as well as coumadin for mitral valve.    LOS: 19 days    Magdalene River, PA-C  11/01/2021  Had a good day, responded to po lasix  OK for discharge in am- no lasix Needs INR check in coumadin clinic thurs/fri  patient examined and medical record reviewed,agree with above note. Dahlia Byes 11/01/2021

## 2021-11-01 NOTE — TOC Transition Note (Signed)
Transition of Care (TOC) - CM/SW Discharge Note Marvetta Gibbons RN, BSN Transitions of Care Unit 4E- RN Case Manager See Treatment Team for direct phone #    Patient Details  Name: Jon [name] MRN: [id number] Date of Birth: 08/13/1945  Transition of Care [facility]) CM/SW Contact:  Dawayne Patricia, RN Phone Number: 11/01/2021, 2:08 PM   Clinical Narrative:    CM follow up done for [facility] needs- pt had wanted to think on it over the weekend.  CM spoke with pt at bedside- per pt he has decided he would go ahead and have [place] set up as per recommendations. Anticipate d/c in the AM.   Per pt he has used [facility] with him wife in past- would like to use them for his needs.  CM placed call to liaisonCaryl Pina for HHPT/OT referral - referral has been accepted by [facility] ([facility]) they will contact pt post discharge for scheduling of HHPT/OT needs.   Family to transport home. No DME needs per pt.   RNCM will sign off for now as intervention is no longer needed. Please re-consult  if new needs arise, or contact RNCM assigned to treatment team for further questions/concerns.      Final next level of care: Miguel Barrera Barriers to Discharge: Continued Medical Work up   Patient Goals and CMS Choice Patient states their goals for this hospitalization and ongoing recovery are:: to return home.   Choice offered to / list presented to : NA  Discharge Placement               Home w [facility]        Discharge Plan and Services In-house Referral: NA Discharge Planning Services: CM Consult Post Acute Care Choice: NA          DME Arranged: N/A DME Agency: NA       HH Arranged: PT, OT [place] Agency: [place] ([facility]) Date [place]: 11/01/21 Time [place]: 1407 Representative spoke with at [place]: [place] ([place]) Interventions     Readmission Risk Interventions    11/01/2021     2:07 PM 10/18/2021    4:34 PM  Readmission Risk Prevention Plan  Transportation Screening Complete Complete  PCP or Specialist Appt within 3-5 Days  Complete  HRI or [place]  Complete  Social Work Consult for [place] Planning/Counseling  Complete  Palliative Care Screening  Not Applicable  Medication Review [employer]) Complete Referral to Pharmacy  PCP or Specialist appointment within 3-5 days of discharge Complete   HRI or [place] Complete   SW Recovery Care/Counseling Consult Complete   [place] Not Applicable

## 2021-11-01 NOTE — Progress Notes (Signed)
Pt stated he could place self on cpap when ready for bed. 

## 2021-11-01 NOTE — Progress Notes (Signed)
CARDIAC REHAB PHASE I   PRE:  Rate/Rhythm: 97 SR  BP:  Sitting: 96/75      SaO2: 97 RA   MODE:  Ambulation: 190 ft   POST:  Rate/Rhythm: 90 SR  BP:  Sitting: 105/80      SaO2: 98 RA   Pt ambulated in hall using front wheel walker. Pt tolerated well with no sob or light headed feelings. Pt states he feels stronger today. Back to room to chair for wash up at sink. No questions or concerns regarding teaching provided Saturday. Will continue to follow.   [phone]  Vanessa Barbara, RN BSN 11/01/2021 11:06 AM

## 2021-11-01 NOTE — Progress Notes (Signed)
Occupational Therapy Treatment Patient Details Name: Jon [name] MRN: [id number] DOB: 1945-11-12 Today's Date: 11/01/2021   History of present illness Pt is a 76 y/o male admitted 10/13/21 with CP and lightheadedness. Found to have A-fib with SSS, symptomatic bradycardia, STEMI s/p unsuccessful PCI 9/13. 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 Pt with post-op bradycardia. Temporary pacemaker removed 10/1. PMH includes A-fib, DM, glaucoma, HTN, OSA.   OT comments  Jon [name] is making great progress. He demonstrated great adherence to sternal precautions during transfers and functional tasks, and overall needed up to min G throughout session. He remains limited by activity tolerance but was able to ambulate >household distance with only 1 standing rest break and rollator. OT to continue to follow. POC remains appropriate.    Recommendations for follow up therapy are one component of a multi-disciplinary discharge planning process, led by the attending physician.  Recommendations may be updated based on patient status, additional functional criteria and insurance authorization.    Follow Up Recommendations  Home health OT    Assistance Recommended at Discharge Frequent or constant Supervision/Assistance  Patient can return home with the following  A little help with walking and/or transfers;A little help with bathing/dressing/bathroom;Assistance with cooking/housework;Assist for transportation;Help with stairs or ramp for entrance   Equipment Recommendations  None recommended by OT       Precautions / Restrictions Precautions Precautions: Fall;Sternal Precaution Booklet Issued: No Precaution Comments: educated in sternal precautions Restrictions Weight Bearing Restrictions: No       Mobility Bed Mobility               General bed mobility comments: OOB upon arrival    Transfers Overall transfer level: Needs assistance Equipment used: Rollator (4 wheels) Transfers: Sit to/from  Stand Sit to Stand: Min guard           General transfer comment: min G for inital stand, good technique. a little unsteady upon standing     Balance Overall balance assessment: Needs assistance Sitting-balance support: Feet supported, No upper extremity supported Sitting balance-Leahy Scale: Good     Standing balance support: Reliant on assistive device for balance Standing balance-Leahy Scale: Fair Standing balance comment: can static stand without UE support, use of rollator to ambulate                           ADL either performed or assessed with clinical judgement   ADL Overall ADL's : Needs assistance/impaired                         Toilet Transfer: Rollator (4 wheels);Ambulation Toilet Transfer Details (indicate cue type and reason): simulated. ambulated >household distance         Functional mobility during ADLs: Min guard General ADL Comments: good demo of sternal precautions with transfers. continues to have limited activity tolerance, but able to complete >household distance with 1x standing rest break    Extremity/Trunk Assessment Upper Extremity Assessment Upper Extremity Assessment: Overall WFL for tasks assessed   Lower Extremity Assessment Lower Extremity Assessment: Defer to PT evaluation        Vision   Vision Assessment?: No apparent visual deficits   Perception Perception Perception: Not tested   Praxis      Cognition Arousal/Alertness: Awake/alert Behavior During Therapy: WFL for tasks assessed/performed Overall Cognitive Status: Within Functional Limits for tasks assessed  General Comments: great recall of precautions during functional tasks              General Comments possible plans for dc tomorrow    Pertinent Vitals/ Pain       Pain Assessment Pain Assessment: Faces Faces Pain Scale: Hurts a little bit Pain Intervention(s): Monitored during  session   Frequency  Min 2X/week        Progress Toward Goals  OT Goals(current goals can now be found in the care plan section)  Progress towards OT goals: Progressing toward goals  Acute Rehab OT Goals OT Goal Formulation: With patient Time For Goal Achievement: 11/12/21 Potential to Achieve Goals: Good ADL Goals Pt Will Perform Grooming: with modified independence;standing Pt Will Perform Lower Body Bathing: with modified independence;sit to/from stand Pt Will Perform Lower Body Dressing: with modified independence;sit to/from stand Pt Will Transfer to Toilet: with modified independence;ambulating;bedside commode Pt Will Perform Toileting - Clothing Manipulation and hygiene: with modified independence;sit to/from stand Additional ADL Goal #1: Pt will adhere to sternal precautions during ADLs and mobility. Additional ADL Goal #2: Pt will state at least 3 energy conservation strategies as instructed. Additional ADL Goal #3: Pt will perform bed mobility modified independently.  Plan Discharge plan remains appropriate       AM-PAC OT "6 Clicks" Daily Activity     Outcome Measure   Help from another person eating meals?: None Help from another person taking care of personal grooming?: A Little Help from another person toileting, which includes using toliet, bedpan, or urinal?: A Little Help from another person bathing (including washing, rinsing, drying)?: A Little Help from another person to put on and taking off regular upper body clothing?: None Help from another person to put on and taking off regular lower body clothing?: A Little 6 Click Score: 20    End of Session Equipment Utilized During Treatment: Rollator (4 wheels);Gait belt  OT Visit Diagnosis: Unsteadiness on feet (R26.81);Other abnormalities of gait and mobility (R26.89);Muscle weakness (generalized) (M62.81)   Activity Tolerance Patient tolerated treatment well   Patient Left in bed;with call bell/phone  within reach;with family/visitor present   Nurse Communication Mobility status        Time: 4287-6811 OT Time Calculation (min): 15 min  Charges: OT General Charges $OT Visit: 1 Visit OT Treatments $Therapeutic Activity: 8-22 mins   Elliot Cousin 11/01/2021, 3:43 PM

## 2021-11-01 NOTE — Progress Notes (Signed)
Physical Therapy Treatment Patient Details Name: Jon [name] MRN: [id number] DOB: 1945-10-04 Today's Date: 11/01/2021   History of Present Illness Pt is a 76 y/o male admitted 10/13/21 with CP and lightheadedness. Found to have A-fib with SSS, symptomatic bradycardia, STEMI s/p unsuccessful PCI 9/13. 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 Pt with post-op bradycardia. Temporary pacemaker removed 10/1. PMH includes A-fib, DM, glaucoma, HTN, OSA.   PT Comments    Pt progressing with mobility; able to transfer and ambulate with rollator at supervision-level. Pt hopeful for d/c home soon; increased time discussing activity recommendations, BLE edema control, assist and DME needs. Pt will have necessary support from family upon return home. Continue to recommend HHPT services to maximize functional mobility and independence upon return home.    Recommendations for follow up therapy are one component of a multi-disciplinary discharge planning process, led by the attending physician.  Recommendations may be updated based on patient status, additional functional criteria and insurance authorization.  Follow Up Recommendations  Home health PT     Assistance Recommended at Discharge Frequent or constant Supervision/Assistance  Patient can return home with the following A little help with walking and/or transfers;A little help with bathing/dressing/bathroom;Help with stairs or ramp for entrance;Assist for transportation;Assistance with cooking/housework   Equipment Recommendations  None recommended by PT    Recommendations for Other Services       Precautions / Restrictions Precautions Precautions: Fall;Sternal     Mobility  Bed Mobility               General bed mobility comments: OOB in recliner    Transfers Overall transfer level: Needs assistance Equipment used: Rollator (4 wheels) Transfers: Sit to/from Stand Sit to Stand: Supervision           General transfer comment:  multiple sit<>stands from recliner to rollator, initial cues for sequencing and placement, pt with good carryover to subsequent trials; min guard provided for first stand, progressing to supervision, requires less use of momentum by final stand    Ambulation/Gait Ambulation/Gait assistance: Supervision Gait Distance (Feet): 200 Feet Assistive device: Rollator (4 wheels) Gait Pattern/deviations: Step-through pattern, Decreased stride length, Trunk flexed Gait velocity: Decreased     General Gait Details: slow, steady gait with rollator and supervision for safety/lines; pt with good awareness of activity pacing; seated rest secondary to fatigue and SOB   Stairs             Wheelchair Mobility    Modified Rankin (Stroke Patients Only)       Balance Overall balance assessment: Needs assistance Sitting-balance support: Feet supported, No upper extremity supported Sitting balance-Leahy Scale: Good     Standing balance support: Reliant on assistive device for balance Standing balance-Leahy Scale: Fair Standing balance comment: can static stand without UE support, use of rollator to ambulate                            Cognition Arousal/Alertness: Awake/alert Behavior During Therapy: WFL for tasks assessed/performed Overall Cognitive Status: Within Functional Limits for tasks assessed                                          Exercises      General Comments General comments (skin integrity, edema, etc.): potential for d/c home soon - increased time discussing this, pt reports plan to have  sister stay with him for initial support; further discussed home setup, DME needs (owns rollator), activity recommendations, BLE edema control, importance of mobility      Pertinent Vitals/Pain Pain Assessment Pain Assessment: Faces Faces Pain Scale: Hurts a little bit Pain Location: RLE Pain Descriptors / Indicators: Sore, Heaviness Pain Intervention(s):  Monitored during session, Repositioned (encouraged BLE elevation)    Home Living                          Prior Function            PT Goals (current goals can now be found in the care plan section) Progress towards PT goals: Progressing toward goals    Frequency    Min 3X/week      PT Plan Current plan remains appropriate    Co-evaluation              AM-PAC PT "6 Clicks" Mobility   Outcome Measure  Help needed turning from your back to your side while in a flat bed without using bedrails?: A Little Help needed moving from lying on your back to sitting on the side of a flat bed without using bedrails?: A Little Help needed moving to and from a bed to a chair (including a wheelchair)?: A Little Help needed standing up from a chair using your arms (e.g., wheelchair or bedside chair)?: A Little Help needed to walk in hospital room?: A Little Help needed climbing 3-5 steps with a railing? : A Little 6 Click Score: 18    End of Session Equipment Utilized During Treatment: Gait belt Activity Tolerance: Patient tolerated treatment well Patient left: in chair;with call bell/phone within reach Nurse Communication: Mobility status PT Visit Diagnosis: Difficulty in walking, not elsewhere classified (R26.2);Muscle weakness (generalized) (M62.81)     Time: 5456-2563 PT Time Calculation (min) (ACUTE ONLY): 26 min  Charges:  $Therapeutic Activity: 8-22 mins $Self Care/Home Management: [place], PT, DPT Acute Rehabilitation Services  Personal: [place] [facility] Office: [place] 11/01/2021, 12:49 PM

## 2021-11-02 LAB — BASIC METABOLIC PANEL
Anion gap: 8 (ref 5–15)
BUN: 14 mg/dL (ref 8–23)
CO2: 23 mmol/L (ref 22–32)
Calcium: 8.2 mg/dL — ABNORMAL LOW (ref 8.9–10.3)
Chloride: 107 mmol/L (ref 98–111)
Creatinine, Ser: 1.28 mg/dL — ABNORMAL HIGH (ref 0.61–1.24)
GFR, Estimated: 58 mL/min — ABNORMAL LOW (ref >60)
Glucose, Bld: 145 mg/dL — ABNORMAL HIGH (ref 70–99)
Potassium: 3.8 mmol/L (ref 3.5–5.1)
Sodium: 138 mmol/L (ref 135–145)

## 2021-11-02 LAB — PROTIME-INR
INR: 1.7 — ABNORMAL HIGH (ref 0.8–1.2)
Prothrombin Time: 19.5 seconds — ABNORMAL HIGH (ref 11.4–15.2)

## 2021-11-02 LAB — PATHOLOGIST SMEAR REVIEW

## 2021-11-02 LAB — GLUCOSE, CAPILLARY: Glucose-Capillary: 115 mg/dL — ABNORMAL HIGH (ref 70–99)

## 2021-11-02 MED ORDER — WARFARIN SODIUM 2.5 MG PO TABS: 2.5000 mg | ORAL_TABLET | Freq: Every day | ORAL | 3 refills | Status: DC

## 2021-11-02 MED ORDER — MIDODRINE HCL 5 MG PO TABS: 15.0000 mg | ORAL_TABLET | Freq: Three times a day (TID) | ORAL | 2 refills | Status: DC

## 2021-11-02 MED ORDER — OXYCODONE HCL 5 MG PO TABS: 5.0000 mg | ORAL_TABLET | ORAL | 0 refills | Status: DC | PRN

## 2021-11-02 MED ORDER — ATORVASTATIN CALCIUM 80 MG PO TABS: 80.0000 mg | ORAL_TABLET | Freq: Every day | ORAL | 1 refills | Status: DC

## 2021-11-02 MED ORDER — AMIODARONE HCL 200 MG PO TABS: ORAL_TABLET | ORAL | 1 refills | Status: DC

## 2021-11-02 MED ORDER — ASPIRIN 81 MG PO TBEC: 81.0000 mg | DELAYED_RELEASE_TABLET | Freq: Every day | ORAL | 12 refills | Status: AC

## 2021-11-02 NOTE — Progress Notes (Signed)
CARDIAC REHAB PHASE I   Pt is preparing for discharge home today. He is feeling good this morning and happy to get back home. Reviewed home education. All questions and concerns addressed. Pt is looking forward to CRP2 here at [facility].   [id number]  Vanessa Barbara, RN BSN 11/02/2021 9:05 AM

## 2021-11-02 NOTE — Progress Notes (Signed)
Physical Therapy Treatment Patient Details Name: Jon [name] MRN: [id number] DOB: 02-23-1945 Today's Date: 11/02/2021   History of Present Illness Pt is a 76 y/o male admitted 10/13/21 with CP and lightheadedness. Found to have A-fib with SSS, symptomatic bradycardia, STEMI s/p unsuccessful PCI 9/13. 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 Pt with post-op bradycardia. Temporary pacemaker removed 10/1. PMH includes A-fib, DM, glaucoma, HTN, OSA.    PT Comments    Pt with continued progress this session with focus on stair training and continued gait training for increased activity tolerance and safety with mobility for d/c to home. Pt able to demonstrate steady ambulation with rollator, however distance limited secondary to increased fatigue s/p stair negotiation. Pt able to ascend/descend steps in stairwell with min assist for sequencing and steadying assist, pt with good adherence to sternal precautions throughout. Continued discussion re; energy conservation, activity recommendations, and increasing activity tolerance slowly with pt verbalizing understanding. Pt continues to benefit from skilled PT services to progress toward functional mobility goals.    Recommendations for follow up therapy are one component of a multi-disciplinary discharge planning process, led by the attending physician.  Recommendations may be updated based on patient status, additional functional criteria and insurance authorization.  Follow Up Recommendations  Home health PT     Assistance Recommended at Discharge Frequent or constant Supervision/Assistance  Patient can return home with the following A little help with walking and/or transfers;A little help with bathing/dressing/bathroom;Help with stairs or ramp for entrance;Assist for transportation;Assistance with cooking/housework   Equipment Recommendations  None recommended by PT    Recommendations for Other Services       Precautions / Restrictions  Precautions Precautions: Fall;Sternal Precaution Booklet Issued: No Precaution Comments: good recall of precatuion comments Restrictions Weight Bearing Restrictions: No RUE Weight Bearing: Partial weight bearing RUE Partial Weight Bearing Percentage or Pounds:  (Sternal precautions) LUE Weight Bearing: Partial weight bearing LUE Partial Weight Bearing Percentage or Pounds:  (Sternal precautions) Other Position/Activity Restrictions: external pacer     Mobility  Bed Mobility Overal bed mobility: Needs Assistance             General bed mobility comments: seated EOB on arrival    Transfers Overall transfer level: Needs assistance Equipment used: Rollator (4 wheels) Transfers: Sit to/from Stand Sit to Stand: Min guard           General transfer comment: min G for inital stand, good technique.    Ambulation/Gait Ambulation/Gait assistance: Supervision Gait Distance (Feet): 40 Feet Assistive device: Rollator (4 wheels) Gait Pattern/deviations: Step-through pattern, Decreased stride length, Trunk flexed Gait velocity: Decreased     General Gait Details: slow steady gait with rollator, cues for upright posture, distance limited to increased fatigue s/p stair training   Stairs Stairs: Yes Stairs assistance: Min assist Stair Management: One rail Left, Step to pattern, Forwards Number of Stairs: 3 General stair comments: up/down 3 steps in stairwell with min assist to steady and for cues for sequencing. pt with increased fatigue after needing seaded rest. discussed placing chair at top of steps at home for rest break as well   Wheelchair Mobility    Modified Rankin (Stroke Patients Only)       Balance Overall balance assessment: Needs assistance Sitting-balance support: Feet supported, No upper extremity supported Sitting balance-Leahy Scale: Good     Standing balance support: Reliant on assistive device for balance Standing balance-Leahy Scale:  Fair Standing balance comment: can static stand without UE support, use of rollator to  ambulate                            Cognition Arousal/Alertness: Awake/alert Behavior During Therapy: WFL for tasks assessed/performed Overall Cognitive Status: Within Functional Limits for tasks assessed                                 General Comments: great recall of precautions during functional tasks        Exercises      General Comments General comments (skin integrity, edema, etc.): pt with increased fatigue/LE soreness this date, pt atribuiting this to STS exercise yesterday      Pertinent Vitals/Pain Pain Assessment Pain Assessment: Faces Faces Pain Scale: Hurts a little bit Pain Location: RLE Pain Descriptors / Indicators: Sore, Heaviness Pain Intervention(s): Monitored during session, Limited activity within patient's tolerance    Home Living                          Prior Function            PT Goals (current goals can now be found in the care plan section) Acute Rehab PT Goals Patient Stated Goal: to go home PT Goal Formulation: With patient Time For Goal Achievement: 11/08/21    Frequency    Min 3X/week      PT Plan Current plan remains appropriate    Co-evaluation              AM-PAC PT "6 Clicks" Mobility   Outcome Measure  Help needed turning from your back to your side while in a flat bed without using bedrails?: A Little Help needed moving from lying on your back to sitting on the side of a flat bed without using bedrails?: A Little Help needed moving to and from a bed to a chair (including a wheelchair)?: A Little Help needed standing up from a chair using your arms (e.g., wheelchair or bedside chair)?: A Little Help needed to walk in hospital room?: A Little Help needed climbing 3-5 steps with a railing? : A Little 6 Click Score: 18    End of Session Equipment Utilized During Treatment: Gait  belt Activity Tolerance: Patient tolerated treatment well;Patient limited by fatigue Patient left: in chair;with call bell/phone within reach Nurse Communication: Mobility status PT Visit Diagnosis: Difficulty in walking, not elsewhere classified (R26.2);Muscle weakness (generalized) (M62.81)     Time: 4166-0630 PT Time Calculation (min) (ACUTE ONLY): 19 min  Charges:  $Gait Training: 8-22 mins                     Amorina Doerr R. PTA Acute Rehabilitation Services Office: [place] 11/02/2021, 10:41 AM

## 2021-11-02 NOTE — Progress Notes (Signed)
Nursing Dc note  Patient alert and oriented, verbalized understanding of dc instructions.all belongings given to patient. Denies pain or sob. Dc papers given to patient as well. Seth Bake NT is coming to take patient to [facility]

## 2021-11-02 NOTE — Progress Notes (Signed)
      [address]       [place] [address]             [phone]       11 Days Post-Op Procedure(s) (LRB): CORONARY ARTERY BYPASS GRAFTING (CABG) X THREE, USING LEFT INTERNAL MAMMARY ARTERY AND RIGHT LEG GREATER SAPHENOUS VEIN HARVESTED ENDOSCOPICALLY (N/A) MAZE (N/A) TRANSESOPHAGEAL ECHOCARDIOGRAM (TEE) (N/A) MITRAL VALVE REPAIR (MVR) USING MCCARTHY-ADAMS RING SIZE 30MM (N/A) Subjective: Patient states his legs feel a little sore due to swelling and 3 walks yesterday  Objective: Vital signs in last 24 hours: Temp:  [97.9 F (36.6 C)-99 F (37.2 C)] 98.1 F (36.7 C) (10/03 0424) Pulse Rate:  [60-100] 63 (10/03 0424) Cardiac Rhythm: Heart block (10/02 1900) Resp:  [19-20] 20 (10/03 0424) BP: (93-105)/(67-80) 93/67 (10/03 0424) SpO2:  [96 %-100 %] 96 % (10/03 0424) Weight:  [93.5 kg] 93.5 kg (10/03 0424)  Hemodynamic parameters for last 24 hours:    Intake/Output from previous day: 10/02 0701 - 10/03 0700 In: 3 [I.V.:3] Out: 1275 [Urine:1275] Intake/Output this shift: No intake/output data recorded.  General appearance: alert, cooperative, and no distress Neurologic: intact Heart: regular rate and rhythm, S1, S2 normal, no murmur, click, rub or gallop Lungs: Diminished BS left basilar, otherwise clear to auscultation Abdomen: soft, non-tender; bowel sounds normal; no masses,  no organomegaly Extremities: Mildly edematous, no erythema Wound: Clean, dry, intact  Lab Results: Recent Labs    11/01/21 0154  WBC 11.2*  HGB 8.2*  HCT 25.1*  PLT 652*   BMET:  Recent Labs    11/01/21 0154 11/02/21 0106  NA 137 138  K 4.0 3.8  CL 105 107  CO2 24 23  GLUCOSE 108* 145*  BUN 15 14  CREATININE 1.36* 1.28*  CALCIUM 8.2* 8.2*    PT/INR:  Recent Labs    11/02/21 0106  LABPROT 19.5*  INR 1.7*   ABG    Component Value Date/Time   PHART 7.293 (L) 10/22/2021 2240   HCO3 18.4 (L) 10/22/2021 2240   TCO2 20 (L) 10/22/2021 2240    ACIDBASEDEF 7.0 (H) 10/22/2021 2240   O2SAT 97 10/22/2021 2240   CBG (last 3)  Recent Labs    11/01/21 1606 11/01/21 2032 11/02/21 0612  GLUCAP 143* 138* 115*    Assessment/Plan: S/P Procedure(s) (LRB): CORONARY ARTERY BYPASS GRAFTING (CABG) X THREE, USING LEFT INTERNAL MAMMARY ARTERY AND RIGHT LEG GREATER SAPHENOUS VEIN HARVESTED ENDOSCOPICALLY (N/A) MAZE (N/A) TRANSESOPHAGEAL ECHOCARDIOGRAM (TEE) (N/A) MITRAL VALVE REPAIR (MVR) USING MCCARTHY-ADAMS RING SIZE 30MM (N/A)  CV: NSR with some PVC's this morning, rate 60's on '200mg'$  PO amiodarone. SBP 90's on midodrine '15mg'$  TID will continue at discharge. On coumadin 2.'5mg'$ , INR 1.7.  Pulm: Saturating 96% on RA, small left basilar pleural effusion. No complaints of shortness of breath today or yesterday during ambulation. Continue IS and ambulation.  GI: +BM Endo: DM2 CBGs 143/138/115, restart home meds at d/c Fluid overload: +7kg from preop weight, PO lasix given yesterday, tolerated well. Cr 1.28 today. Mild-moderate leg edema, likely causing leg aches, no sign of DVT. Encouraged continued ambulation, elevation and compression stockings at d/c. Dispo: D/C to home with home health. Follow up PT/INR check at coumadin clinic is set for Friday.    LOS: 20 days    Magdalene River, PA-C 11/02/2021

## 2021-11-02 NOTE — Care Management Important Message (Signed)
Important Message  Patient Details  Name: MENDEL BINSFELD MRN: [id number] Date of Birth: [birth year]   Medicare Important Message Given:  Yes     [name] 11/02/2021, 10:56 AM

## 2021-11-03 DIAGNOSIS — E119 Type 2 diabetes mellitus without complications: Secondary | ICD-10-CM | POA: Diagnosis not present

## 2021-11-03 DIAGNOSIS — I480 Paroxysmal atrial fibrillation: Secondary | ICD-10-CM | POA: Diagnosis not present

## 2021-11-03 DIAGNOSIS — I25110 Atherosclerotic heart disease of native coronary artery with unstable angina pectoris: Secondary | ICD-10-CM | POA: Diagnosis not present

## 2021-11-03 DIAGNOSIS — D62 Acute posthemorrhagic anemia: Secondary | ICD-10-CM | POA: Diagnosis not present

## 2021-11-03 DIAGNOSIS — I081 Rheumatic disorders of both mitral and tricuspid valves: Secondary | ICD-10-CM | POA: Diagnosis not present

## 2021-11-03 DIAGNOSIS — R001 Bradycardia, unspecified: Secondary | ICD-10-CM | POA: Diagnosis not present

## 2021-11-03 DIAGNOSIS — Z48812 Encounter for surgical aftercare following surgery on the circulatory system: Secondary | ICD-10-CM | POA: Diagnosis not present

## 2021-11-03 DIAGNOSIS — I2119 ST elevation (STEMI) myocardial infarction involving other coronary artery of inferior wall: Secondary | ICD-10-CM | POA: Diagnosis not present

## 2021-11-03 DIAGNOSIS — K5790 Diverticulosis of intestine, part unspecified, without perforation or abscess without bleeding: Secondary | ICD-10-CM | POA: Diagnosis not present

## 2021-11-03 DIAGNOSIS — G709 Myoneural disorder, unspecified: Secondary | ICD-10-CM | POA: Diagnosis not present

## 2021-11-03 DIAGNOSIS — I119 Hypertensive heart disease without heart failure: Secondary | ICD-10-CM | POA: Diagnosis not present

## 2021-11-03 DIAGNOSIS — N179 Acute kidney failure, unspecified: Secondary | ICD-10-CM | POA: Diagnosis not present

## 2021-11-03 DIAGNOSIS — G5701 Lesion of sciatic nerve, right lower limb: Secondary | ICD-10-CM | POA: Diagnosis not present

## 2021-11-03 DIAGNOSIS — R55 Syncope and collapse: Secondary | ICD-10-CM | POA: Diagnosis not present

## 2021-11-03 DIAGNOSIS — G4733 Obstructive sleep apnea (adult) (pediatric): Secondary | ICD-10-CM | POA: Diagnosis not present

## 2021-11-03 DIAGNOSIS — I4892 Unspecified atrial flutter: Secondary | ICD-10-CM | POA: Diagnosis not present

## 2021-11-04 ENCOUNTER — Telehealth: Payer: Self-pay | Admitting: *Deleted

## 2021-11-04 NOTE — Telephone Encounter (Signed)
Transition Care Management Follow-up Telephone Call Date of discharge and from where: 11/02/21 [place] How have you been since you were released from the hospital? Fine Any questions or concerns? No  Items Reviewed: Did the pt receive and understand the discharge instructions provided? Yes  Medications obtained and verified?  Yes Other?    Any new allergies since your discharge? No  Dietary orders reviewed? No Do you have support at home? Yes   Home Care and Equipment/Supplies: Were home health services ordered? no If so, what is the name of the agency?   Has the agency set up a time to come to the patient's home?  Were any new equipment or medical supplies ordered?  No What is the name of the medical supply agency?  Were you able to get the supplies/equipment? not applicable Do you have any questions related to the use of the equipment or supplies? No  Functional Questionnaire: (I = Independent and D = Dependent) ADLs: yes  Bathing/Dressing- yes  Meal Prep- no  Eating- yes  Maintaining continence- yes  Transferring/Ambulation- yes  Managing Meds- yes  Follow up appointments reviewed:  PCP Hospital f/u appt confirmed? Yes  Scheduled to see Dr. Tessa Lerner on 11/08/21. [facility] f/u appt confirmed? Yes  He state he has several appt with heart specialist. Are transportation arrangements needed? No  If their condition worsens, is the pt aware to call PCP or go to the Emergency Dept.? Yes Was the patient provided with contact information for the PCP's office or ED? Yes Was to pt encouraged to call back with questions or concerns? Yes

## 2021-11-05 ENCOUNTER — Encounter: Payer: Self-pay | Admitting: *Deleted

## 2021-11-05 ENCOUNTER — Other Ambulatory Visit: Payer: Self-pay | Admitting: Thoracic Surgery (Cardiothoracic Vascular Surgery)

## 2021-11-05 ENCOUNTER — Ambulatory Visit: Payer: Medicare Other

## 2021-11-05 ENCOUNTER — Telehealth: Payer: Self-pay | Admitting: *Deleted

## 2021-11-05 ENCOUNTER — Other Ambulatory Visit: Payer: Self-pay

## 2021-11-05 DIAGNOSIS — I4891 Unspecified atrial fibrillation: Secondary | ICD-10-CM

## 2021-11-05 DIAGNOSIS — Z7901 Long term (current) use of anticoagulants: Secondary | ICD-10-CM | POA: Diagnosis not present

## 2021-11-05 DIAGNOSIS — I059 Rheumatic mitral valve disease, unspecified: Secondary | ICD-10-CM

## 2021-11-05 DIAGNOSIS — I480 Paroxysmal atrial fibrillation: Secondary | ICD-10-CM

## 2021-11-05 DIAGNOSIS — Z8679 Personal history of other diseases of the circulatory system: Secondary | ICD-10-CM

## 2021-11-05 DIAGNOSIS — Z9889 Other specified postprocedural states: Secondary | ICD-10-CM

## 2021-11-05 DIAGNOSIS — Z951 Presence of aortocoronary bypass graft: Secondary | ICD-10-CM

## 2021-11-05 LAB — POCT INR: INR: 2 (ref 2.0–3.0)

## 2021-11-05 NOTE — Patient Outreach (Signed)
  Care Coordination [facility] Note Transition Care Management Follow-up Telephone Call Date of discharge and from where: Tuesday, November 02, 2021 [facility]-- chest pain; AKI; CABG How have you been since you were released from the hospital? "I am doing pretty good overall; my sons and grandsons are helping me with anything I need and they check in on me throughout the day every day.  Today, I have been having some episodes every now and then when it seems like I am burping or throwing up, but I am not nauseated and nothing comes up-- I think it's because I haven't been to poop since Wednesday; they did not tell me to take a stool softener when I got home" Any questions or concerns? Yes  Items Reviewed: Did the pt receive and understand the discharge instructions provided? Yes  Medications obtained and verified? Yes  Other? No  Any new allergies since your discharge? No  Dietary orders reviewed? Yes Do you have support at home? Yes  sons and grandsons are assisting as needed/ indicated  Home Care and Equipment/Supplies: Were home health services ordered? yes If so, what is the name of the agency? [facility]  Has the agency set up a time to come to the patient's home? Yes- currently active- [facility] Were any new equipment or medical supplies ordered?  No What is the name of the medical supply agency? N/A Were you able to get the supplies/equipment? not applicable Do you have any questions related to the use of the equipment or supplies? No N/A  Functional Questionnaire: (I = Independent and D = Dependent) ADLs: I  sons and grandsons are assisting as needed/ indicated  Bathing/Dressing- I  Meal Prep- I  sons and grandsons are assisting as needed/ indicated  Eating- I  Maintaining continence- I  Transferring/Ambulation- I  Managing Meds- I  sons and grandsons are assisting as needed/ indicated  Follow up appointments reviewed:  PCP Hospital f/u appt confirmed? Yes  Scheduled to see  PCP, Dr. Alain Marion on Monday, 11/08/21 @ 08:30 am [facility] f/u appt confirmed? Yes  Scheduled to see [facility] surgical provider on Monday 11/08/21 @ 3:30 pm Are transportation arrangements needed? No  If their condition worsens, is the pt aware to call PCP or go to the Emergency Dept.? Yes Was the patient provided with contact information for the PCP's office or ED? Yes Was to pt encouraged to call back with questions or concerns? Yes  SDOH assessments and interventions completed:   Yes  Care Coordination Interventions Activated:  Yes   Care Coordination Interventions:  Provided education around need to avoid straining for bowel movement; discussed need to inform PCP at upcoming scheduled appointment on Monday 11/08/21 if he has still not had BM/ ask about need for stool softener; discussed side effects of opioids (constipation) and diet and provided education around eating small frequent meals without overloading stomach at one sitting; provided my direct phone number to patient should needs/ questions arise in the future    Encounter Outcome:  Pt. Visit Completed    Oneta Rack, RN, BSN, CCRN Alumnus RN CM Care Coordination/ Transition of [place] Management [phone]: direct office

## 2021-11-05 NOTE — Patient Instructions (Addendum)
Description   START taking 1 tablet daily EXCEPT 0.5 tablet on Tuesdays and Saturdays.  Stay consistent with greens each week  Recheck INR on Wednesday by [place] '200mg'$  BID X 7 days, then '200mg'$  daily*       A full discussion of the nature of anticoagulants has been carried out.  A benefit risk analysis has been presented to the patient, so that they understand the justification for choosing anticoagulation at this time. The need for frequent and regular monitoring, precise dosage adjustment and compliance is stressed.  Side effects of potential bleeding are discussed.  The patient should avoid any OTC items containing aspirin or ibuprofen, and should avoid great swings in general diet.  Avoid alcohol consumption.

## 2021-11-05 NOTE — Patient Instructions (Signed)
Make every effort to maintain a "heart-healthy" lifestyle with regular physical exercise and adherence to a low-fat, low-carbohydrate diet.  Continue to seek regular follow-up appointments with your primary care physician and/or cardiologist.    

## 2021-11-05 NOTE — Progress Notes (Unsigned)
[address]       [place] [address]             [phone]     HPI: Patient returns for routine postoperative follow-up having undergone CABG x 3 and Mitral Valve Repair on 10/22/2021. The patient's early postoperative recovery while in the hospital was notable for Atrial Fibrillation/Flutter along with underlying junctional rhythm.  He ultimately required initiation of Amiodarone. Since hospital discharge the patient reports overall he is doing fairly well.  He is not experiencing any pain.  He does have LE edema and states his PCP gave him a prescription for Torsemide this morning at his visit.  He is ambulating without significant difficulty.  His surgical incisions are healing without infection.  He is appetite is fine but he states he gets full easier.  He was encouraged to use Miralax to help facilitate his bowel movements if needed.  Current Outpatient Medications  Medication Sig Dispense Refill   acetaminophen (TYLENOL) 500 MG tablet Take 500-1,000 mg by mouth daily as needed for headache or mild pain.     amiodarone (PACERONE) 200 MG tablet Take '200mg'$  twice per day for 7 days then take '200mg'$  once per day thereafter 60 tablet 1   aspirin EC 81 MG tablet Take 1 tablet (81 mg total) by mouth daily. Swallow whole. 30 tablet 12   atorvastatin (LIPITOR) 80 MG tablet Take 1 tablet (80 mg total) by mouth daily. 30 tablet 1   Cholecalciferol (VITAMIN D-3 PO) Take 1 capsule by mouth daily.     EPINEPHrine 0.3 mg/0.3 mL IJ SOAJ injection Inject 0.3 mg into the muscle as needed for anaphylaxis. 1 each 0   fenofibrate micronized (LOFIBRA) 134 MG capsule TAKE 1 CAPSULE BY MOUTH DAILY BEFORE AND BREAKFAST (Patient taking differently: Take 134 mg by mouth daily before breakfast.) 90 capsule 3   metFORMIN (GLUCOPHAGE) 1000 MG tablet TAKE 1 TABLET(1000 MG) BY MOUTH TWICE DAILY WITH A MEAL (Patient taking differently: Take 1,000 mg by mouth 2 (two) times daily with a meal.) 180  tablet 2   midodrine (PROAMATINE) 5 MG tablet Take 3 tablets (15 mg total) by mouth 3 (three) times daily with meals. 120 tablet 2   Multiple Vitamin (MULTIVITAMIN WITH MINERALS) TABS tablet Take 1 tablet by mouth daily.     nateglinide (STARLIX) 120 MG tablet TAKE 1 TABLET(120 MG) BY MOUTH THREE TIMES DAILY WITH MEALS 270 tablet 1   omeprazole (PRILOSEC) 40 MG capsule TAKE 1 CAPSULE BY MOUTH DAILY (Patient taking differently: Take 40 mg by mouth daily.) 90 capsule 3   oxyCODONE (OXY IR/ROXICODONE) 5 MG immediate release tablet Take 1 tablet (5 mg total) by mouth every 4 (four) hours as needed for severe pain. 28 tablet 0   warfarin (COUMADIN) 2.5 MG tablet Take 1 tablet (2.5 mg total) by mouth daily. 30 tablet 3   No current facility-administered medications for this visit.    Physical Exam:  BP 110/64 (BP Location: Right Arm, Patient Position: Sitting)   Pulse 65   Resp 18   Ht '5\' 10"'$  (1.778 m)   Wt 198 lb (89.8 kg)   SpO2 95% Comment: RA  BMI 28.41 kg/m   Gen: NAD Heart:RRR Lungs: diminished left base Abd: soft non-tender, non-distended Ext: significant edema 2+ pitting Incisions: C/D/I  Diagnostic Tests:  CXR: left pleural effusion, small  A/P:  S/P CABG, MV Repair overall doing well- by ausculation appeared to be in NSR, has  follow up with Cardiology later this week, and EKG can be obtained, INR is therapeutic Left pleural effusion, small monitor.. torsemide should help with this will get repeat CXR in 3 weeks at visit with Dr. Lavonna Monarch BLE edema, pitting- PCP wrote for Torsemide, instructed patient to take daily x 3 days, then take as prescribed Sternal precautions again discussed, hand out provided Dispo- overall doing well, agree with Torsemide prescribed by PCP this morning, will get repeat CXR to assess pleural effusion, RTC In 3 weeks with Dr. Odelia Gage, PA-C [facility] [phone]

## 2021-11-08 ENCOUNTER — Telehealth: Payer: Self-pay

## 2021-11-08 ENCOUNTER — Encounter: Payer: Self-pay | Admitting: Internal Medicine

## 2021-11-08 ENCOUNTER — Ambulatory Visit: Payer: Medicare Other | Admitting: Internal Medicine

## 2021-11-08 ENCOUNTER — Ambulatory Visit
Admission: RE | Admit: 2021-11-08 | Discharge: 2021-11-08 | Disposition: A | Discharge status: Home / Self Care | Payer: Medicare Other | Source: Ambulatory Visit | Attending: Thoracic Surgery (Cardiothoracic Vascular Surgery) | Admitting: Thoracic Surgery (Cardiothoracic Vascular Surgery)

## 2021-11-08 ENCOUNTER — Ambulatory Visit (INDEPENDENT_AMBULATORY_CARE_PROVIDER_SITE_OTHER): Payer: Self-pay | Admitting: Physician Assistant

## 2021-11-08 VITALS — BP 110/64 | HR 65 | Resp 18 | Ht 70.0 in | Wt 198.0 lb

## 2021-11-08 DIAGNOSIS — R55 Syncope and collapse: Secondary | ICD-10-CM

## 2021-11-08 DIAGNOSIS — Z9889 Other specified postprocedural states: Secondary | ICD-10-CM

## 2021-11-08 DIAGNOSIS — Z8679 Personal history of other diseases of the circulatory system: Secondary | ICD-10-CM

## 2021-11-08 DIAGNOSIS — R609 Edema, unspecified: Secondary | ICD-10-CM | POA: Insufficient documentation

## 2021-11-08 DIAGNOSIS — Z951 Presence of aortocoronary bypass graft: Secondary | ICD-10-CM

## 2021-11-08 DIAGNOSIS — J90 Pleural effusion, not elsewhere classified: Secondary | ICD-10-CM | POA: Diagnosis not present

## 2021-11-08 DIAGNOSIS — J9811 Atelectasis: Secondary | ICD-10-CM | POA: Diagnosis not present

## 2021-11-08 DIAGNOSIS — I4891 Unspecified atrial fibrillation: Secondary | ICD-10-CM

## 2021-11-08 DIAGNOSIS — R600 Localized edema: Secondary | ICD-10-CM

## 2021-11-08 DIAGNOSIS — N179 Acute kidney failure, unspecified: Secondary | ICD-10-CM

## 2021-11-08 MED ORDER — TORSEMIDE 10 MG PO TABS: 10.0000 mg | ORAL_TABLET | Freq: Every day | ORAL | 3 refills | Status: DC | PRN

## 2021-11-08 NOTE — Assessment & Plan Note (Signed)
On Coumadin 

## 2021-11-08 NOTE — Assessment & Plan Note (Signed)
On Midodrine - no sx's. Will continue for now

## 2021-11-08 NOTE — Assessment & Plan Note (Signed)
Acute on chronic GFR in 4 wks

## 2021-11-08 NOTE — Assessment & Plan Note (Signed)
Worse Will start Demadex 10-20 mg prn Hydrate wekll

## 2021-11-08 NOTE — Assessment & Plan Note (Signed)
In NSR now

## 2021-11-08 NOTE — Assessment & Plan Note (Signed)
No CP 

## 2021-11-08 NOTE — Telephone Encounter (Signed)
Home Health verbal orders  Agency Name: [facility]   Requesting PT  Frequency: 1W1 2W3 1W5   Please forward to [facility] pool or providers CMA

## 2021-11-08 NOTE — Progress Notes (Signed)
Subjective:  Patient ID: Jon [name], male    DOB: 1945/08/27  Age: 76 y.o. MRN: [id number]  CC: Follow-up [facility] f/u- 6 month f/u)   HPI Jon [name] presents for post-CABG on Sept 22 2023. Back at home x 3 d. He is here w/David, his neighbor.  C/o leg swelling off Lasix. Stomach gets full quick  Per his chart: "Mr. Schlereth was an inpatient at [facility] and was brought to the operating room on 10/22/2021. During intraoperative TEE it was determined his mitral valve regurgitation required repair. He underwent a CABG x 3 utilizing SVG to PDA sequentially to PLA, LIMA to LAD, he also underwent a Maze procedure and a mitral valve repair utilizing a 41m Mccarthy-Adams ring. He tolerated the procedure well and was transferred to the SICU in stable condition.  He required neo-synephrine for blood pressure support overnight.  He require Epi and Levophed to help with hemodynamics.  All drips were weaned as tolerated.  He was extubated without difficulty.  He developed AKI likely due to the contrast, creatinine was 1.37. He was started on Midodrine to help wean pressors.  He developed worsening respiratory status and was placed on BIPAP. He required temporary pacing with underlying junctional rhythm in the 50s and therefore he was not started on a BB at this time. He was volume overloaded and started on Demadex. Creatinine rose to 1.49 so diuretics were held. His chest tubes were removed without difficulty. The patient continued to require support for BP.  Echocardiogram was obtained and showed normal EF.  His MV Repair showed trivial mitral regurgitation.  He was started on Vasopressin for additional support.   He was started on coumadin for his MV Repair.  He developed Atrial Fibrillation overnight.  Due to persistent arrhythmias, EP consult was obtained.  They did not feel PPM placement was indicated.  The patient was found to be in NSR with some mild intraventricular delay.  The  patient had issues with hypoglycemia.  His long acting insulin was discontinued and sliding scale coverage was adjusted.  The patient was stable for transfer to the progressive care unit on 10/28/2021.  He continues to have some PACs and PVCs with some A-fib/flutter.  EP continues to hope to avoid nodal blocking agents for a couple months.  On postop day 7 INR is noted to be 2.3 and Coumadin was held with hopes to remove epicardial pacing wires the following day. Cardiology was re-consulted due to atrial fibrillation with RVR, they started amiodarone. He converted to sinus rhythm on 10/01. AV wires were removed on 10/31/2021 and Coumadin was resumed. His INR was 1.7 on 2.'5mg'$  of coumadin. He was fluid overloaded and chest x-ray showed a small left pleural effusion so 1 dose of PO lasix was given. He tolerated the lasix well. Creatinine was stable at 1.28. He was discharged home on 2.'5mg'$  of Coumadin with an INR 1.7. PT/OT was consulted and they recommended home health PT/OT for assistance with ADLs. He was felt medically stable for discharge home with home health assistance. "   Outpatient Medications Prior to Visit  Medication Sig Dispense Refill  . acetaminophen (TYLENOL) 500 MG tablet Take 500-1,000 mg by mouth daily as needed for headache or mild pain.    .Marland Kitchenamiodarone (PACERONE) 200 MG tablet Take '200mg'$  twice per day for 7 days then take '200mg'$  once per day thereafter 60 tablet 1  . aspirin EC 81 MG tablet Take 1 tablet (81 mg total) by  mouth daily. Swallow whole. 30 tablet 12  . atorvastatin (LIPITOR) 80 MG tablet Take 1 tablet (80 mg total) by mouth daily. 30 tablet 1  . Cholecalciferol (VITAMIN D-3 PO) Take 1 capsule by mouth daily.    Marland Kitchen EPINEPHrine 0.3 mg/0.3 mL IJ SOAJ injection Inject 0.3 mg into the muscle as needed for anaphylaxis. 1 each 0  . fenofibrate micronized (LOFIBRA) 134 MG capsule TAKE 1 CAPSULE BY MOUTH DAILY BEFORE AND BREAKFAST (Patient taking differently: Take 134 mg by mouth daily  before breakfast.) 90 capsule 3  . metFORMIN (GLUCOPHAGE) 1000 MG tablet TAKE 1 TABLET(1000 MG) BY MOUTH TWICE DAILY WITH A MEAL (Patient taking differently: Take 1,000 mg by mouth 2 (two) times daily with a meal.) 180 tablet 2  . midodrine (PROAMATINE) 5 MG tablet Take 3 tablets (15 mg total) by mouth 3 (three) times daily with meals. 120 tablet 2  . Multiple Vitamin (MULTIVITAMIN WITH MINERALS) TABS tablet Take 1 tablet by mouth daily.    . nateglinide (STARLIX) 120 MG tablet TAKE 1 TABLET(120 MG) BY MOUTH THREE TIMES DAILY WITH MEALS 270 tablet 1  . omeprazole (PRILOSEC) 40 MG capsule TAKE 1 CAPSULE BY MOUTH DAILY (Patient taking differently: Take 40 mg by mouth daily.) 90 capsule 3  . oxyCODONE (OXY IR/ROXICODONE) 5 MG immediate release tablet Take 1 tablet (5 mg total) by mouth every 4 (four) hours as needed for severe pain. 28 tablet 0  . warfarin (COUMADIN) 2.5 MG tablet Take 1 tablet (2.5 mg total) by mouth daily. 30 tablet 3   No facility-administered medications prior to visit.    ROS: Review of Systems  Constitutional:  Positive for fatigue. Negative for appetite change and unexpected weight change.  HENT:  Negative for congestion, nosebleeds, sneezing, sore throat and trouble swallowing.   Eyes:  Negative for itching and visual disturbance.  Respiratory:  Negative for cough and shortness of breath.   Cardiovascular:  Positive for leg swelling. Negative for chest pain and palpitations.  Gastrointestinal:  Negative for abdominal distention, blood in stool, diarrhea and nausea.  Genitourinary:  Negative for frequency and hematuria.  Musculoskeletal:  Negative for back pain, gait problem, joint swelling and neck pain.  Skin:  Negative for rash.  Neurological:  Negative for dizziness, tremors, speech difficulty and weakness.  Psychiatric/Behavioral:  Negative for agitation, dysphoric mood, sleep disturbance and suicidal ideas. The patient is not nervous/anxious.     Objective:  BP  110/60 (BP Location: Left Arm)   Pulse 72   Temp 97.8 F (36.6 C) (Oral)   Ht '5\' 10"'$  (1.778 m)   Wt 201 lb (91.2 kg)   BMI 28.84 kg/m   BP Readings from Last 3 Encounters:  11/08/21 110/60  11/02/21 95/69  09/10/21 128/76    Wt Readings from Last 3 Encounters:  11/08/21 201 lb (91.2 kg)  11/02/21 206 lb 3.2 oz (93.5 kg)  09/10/21 198 lb (89.8 kg)    Physical Exam Constitutional:      General: He is not in acute distress.    Appearance: Normal appearance. He is well-developed.     Comments: NAD  Eyes:     Conjunctiva/sclera: Conjunctivae normal.     Pupils: Pupils are equal, round, and reactive to light.  Neck:     Thyroid: No thyromegaly.     Vascular: No JVD.  Cardiovascular:     Rate and Rhythm: Normal rate and regular rhythm.     Heart sounds: Normal heart sounds. No murmur heard.  No friction rub. No gallop.  Pulmonary:     Effort: Pulmonary effort is normal. No respiratory distress.     Breath sounds: Normal breath sounds. No wheezing or rales.  Chest:     Chest wall: No tenderness.  Abdominal:     General: Bowel sounds are normal. There is no distension.     Palpations: Abdomen is soft. There is no mass.     Tenderness: There is no abdominal tenderness. There is no guarding or rebound.  Musculoskeletal:        General: No tenderness. Normal range of motion.     Cervical back: Normal range of motion.     Right lower leg: Edema present.     Left lower leg: Edema present.  Lymphadenopathy:     Cervical: No cervical adenopathy.  Skin:    General: Skin is warm and dry.     Findings: No rash.  Neurological:     Mental Status: He is alert and oriented to person, place, and time.     Cranial Nerves: No cranial nerve deficit.     Motor: No abnormal muscle tone.     Coordination: Coordination normal.     Gait: Gait normal.     Deep Tendon Reflexes: Reflexes are normal and symmetric.  Psychiatric:        Behavior: Behavior normal.        Thought Content:  Thought content normal.        Judgment: Judgment normal.  In a w/c Appears tired    A total time of 45 minutes was spent preparing to see the patient, reviewing tests, x-rays, operative reports and other medical records.  Also, obtaining history and performing comprehensive physical exam.  Additionally, counseling the patient regarding the above listed issues.   Finally, documenting clinical information in the health records, coordination of care, educating the patient - edema, CAD, falls risk, ARF. It is a complex case.   Lab Results  Component Value Date   WBC 11.2 (H) 11/01/2021   HGB 8.2 (L) 11/01/2021   HCT 25.1 (L) 11/01/2021   PLT 652 (H) 11/01/2021   GLUCOSE 145 (H) 11/02/2021   CHOL 135 10/14/2021   TRIG 164 (H) 10/14/2021   HDL 42 10/14/2021   LDLDIRECT 30.0 11/10/2020   LDLCALC 60 10/14/2021   ALT 29 10/21/2021   AST 30 10/21/2021   NA 138 11/02/2021   K 3.8 11/02/2021   CL 107 11/02/2021   CREATININE 1.28 (H) 11/02/2021   BUN 14 11/02/2021   CO2 23 11/02/2021   TSH 0.466 10/13/2021   PSA 1.38 06/10/2019   INR 2.0 11/05/2021   HGBA1C 7.1 (H) 09/02/2021    CARDIAC CATHETERIZATION  Result Date: 10/13/2021 1.  Total occlusion of the right posterolateral branch with associated thrombus, unsuccessful PTCA due to inability to establish antegrade flow 2.  Marked calcification, ectasia, and tortuosity of the native RCA with mild to moderate nonobstructive plaquing throughout the vessel 3.  Patent left main 4.  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  Patent left circumflex without significant stenosis 6.  Successful placement of right internal jugular central venous line with CVP 11 mmHg and mixed venous oxygen saturation 76% on norepinephrine 5 mics Recommendations: Dual antiplatelet therapy with aspirin and ticagrelor at least 12 months without interruption.  Consider staged atherectomy and PCI of the LAD prior to discharge.  Findings and plan  discussed with patient's son.   ECHOCARDIOGRAM COMPLETE  Result Date:  10/13/2021    ECHOCARDIOGRAM REPORT   Patient Name:   KAHLEN MORAIS Date of Exam: 10/13/2021 Medical Rec #:  [id number]          Height:       70.0 in Accession #:    [id number]         Weight:       190.0 lb Date of Birth:  03/23/1945           BSA:          2.042 m Patient Age:    37 years           BP:           128/76 mmHg Patient Gender: M                  HR:           58 bpm. Exam Location:  Inpatient Procedure: 2D Echo, Cardiac Doppler and Color Doppler Indications:    Syncope  History:        Patient has no prior history of Echocardiogram examinations.                 Arrythmias:Atrial Fibrillation, Signs/Symptoms:Murmur; Risk                 Factors:Diabetes and Hypertension.  Sonographer:    Memory Argue Referring Phys: Lequita Halt IMPRESSIONS  1. Left ventricular ejection fraction, by estimation, is 60 to 65%. The left ventricle has normal function. The left ventricle demonstrates regional wall motion abnormalities with basal inferior and basal inferolateral severe hypokinesis. There is mild concentric left ventricular hypertrophy. Left ventricular diastolic parameters are consistent with Grade II diastolic dysfunction (pseudonormalization).  2. Right ventricular systolic function is normal. The right ventricular size is normal. Tricuspid regurgitation signal is inadequate for assessing PA pressure.  3. The mitral valve is normal in structure. Mild mitral valve regurgitation. No evidence of mitral stenosis.  4. The aortic valve is tricuspid. There is severe calcification of the aortic valve. Aortic valve regurgitation is not visualized. There appears to be some degree of aortic stenosis but the aortic valve was not fully interrogated by doppler. Would consider repeat limited echo with attention to aortic valve doppler interrogation.  5. The inferior vena cava is normal in size with greater than 50% respiratory variability,  suggesting right atrial pressure of 3 mmHg. FINDINGS  Left Ventricle: Left ventricular ejection fraction, by estimation, is 60 to 65%. The left ventricle has normal function. The left ventricle demonstrates regional wall motion abnormalities. The left ventricular internal cavity size was normal in size. There is mild concentric left ventricular hypertrophy. Left ventricular diastolic parameters are consistent with Grade II diastolic dysfunction (pseudonormalization). Right Ventricle: The right ventricular size is normal. No increase in right ventricular wall thickness. Right ventricular systolic function is normal. Tricuspid regurgitation signal is inadequate for assessing PA pressure. Left Atrium: Left atrial size was normal in size. Right Atrium: Right atrial size was normal in size. Pericardium: There is no evidence of pericardial effusion. Mitral Valve: The mitral valve is normal in structure. There is mild calcification of the mitral valve leaflet(s). Mild mitral annular calcification. Mild mitral valve regurgitation. No evidence of mitral valve stenosis. Tricuspid Valve: The tricuspid valve is normal in structure. Tricuspid valve regurgitation is not demonstrated. Aortic Valve: The aortic valve is tricuspid. There is severe calcifcation of the aortic valve. Aortic valve regurgitation is not visualized. Aortic valve mean gradient measures 3.0 mmHg. Aortic  valve peak gradient measures 6.4 mmHg. Aortic valve area, by  VTI measures 1.81 cm. Pulmonic Valve: The pulmonic valve was normal in structure. Pulmonic valve regurgitation is not visualized. Aorta: The aortic root is normal in size and structure. Venous: The inferior vena cava is normal in size with greater than 50% respiratory variability, suggesting right atrial pressure of 3 mmHg. IAS/Shunts: No atrial level shunt detected by color flow Doppler.  LEFT VENTRICLE PLAX 2D LVIDd:         4.10 cm   Diastology LVIDs:         2.80 cm   LV e' medial:    5.44 cm/s  LV PW:         1.20 cm   LV E/e' medial:  16.0 LV IVS:        1.30 cm   LV e' lateral:   10.00 cm/s LVOT diam:     2.00 cm   LV E/e' lateral: 8.7 LV SV:         48 LV SV Index:   24 LVOT Area:     3.14 cm  RIGHT VENTRICLE TAPSE (M-mode): 1.7 cm LEFT ATRIUM             Index        RIGHT ATRIUM           Index LA diam:        3.20 cm 1.57 cm/m   RA Area:     15.30 cm LA Vol (A2C):   59.1 ml 28.94 ml/m  RA Volume:   35.40 ml  17.33 ml/m LA Vol (A4C):   50.1 ml 24.53 ml/m LA Biplane Vol: 54.6 ml 26.73 ml/m  AORTIC VALVE AV Area (Vmax):    1.73 cm AV Area (Vmean):   1.67 cm AV Area (VTI):     1.81 cm AV Vmax:           126.00 cm/s AV Vmean:          82.500 cm/s AV VTI:            0.266 m AV Peak Grad:      6.4 mmHg AV Mean Grad:      3.0 mmHg LVOT Vmax:         69.30 cm/s LVOT Vmean:        43.900 cm/s LVOT VTI:          0.153 m LVOT/AV VTI ratio: 0.58  AORTA Ao Root diam: 3.50 cm MITRAL VALVE MV Area (PHT): 3.60 cm    SHUNTS MV Decel Time: 211 msec    Systemic VTI:  0.15 m MR Peak grad: 86.9 mmHg    Systemic Diam: 2.00 cm MR Vmax:      466.00 cm/s MV E velocity: 87.00 cm/s MV A velocity: 78.50 cm/s MV E/A ratio:  1.11 Dalton McleanMD Electronically signed by Franki Monte Signature Date/Time: 10/13/2021/3:25:43 PM    Final    DG Chest Port 1 View  Result Date: 10/13/2021 CLINICAL DATA:  Chest pain. EXAM: PORTABLE CHEST 1 VIEW COMPARISON:  11/23/2015 FINDINGS: 0522 hours. Low volume film. The lungs are clear without focal pneumonia, edema, pneumothorax or pleural effusion. The cardiopericardial silhouette is within normal limits for size. The visualized bony structures of the thorax are unremarkable. Telemetry leads overlie the chest. IMPRESSION: No active disease. Electronically Signed   By: Misty Stanley M.D.   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 Will continue for now      Relevant Medications   torsemide (DEMADEX) 10 MG tablet   Other Relevant Orders   CBC with Differential/Platelet   Comprehensive metabolic panel   TSH   S/P CABG x 3    No CP      Relevant Orders   CBC with Differential/Platelet   Comprehensive metabolic panel   TSH   S/P Maze operation for atrial fibrillation    In NSR now         Meds ordered this encounter  Medications  . torsemide (DEMADEX) 10 MG tablet    Sig: Take 1-2 tablets (10-20 mg total) by mouth daily as needed.    Dispense:  60 tablet    Refill:  3      Follow-up: Return in about 4 weeks (around 12/06/2021) for a follow-up visit.  Walker Kehr, MD

## 2021-11-08 NOTE — Progress Notes (Unsigned)
110 60 

## 2021-11-10 ENCOUNTER — Ambulatory Visit (INDEPENDENT_AMBULATORY_CARE_PROVIDER_SITE_OTHER): Payer: Medicare Other | Admitting: *Deleted

## 2021-11-10 DIAGNOSIS — I059 Rheumatic mitral valve disease, unspecified: Secondary | ICD-10-CM

## 2021-11-10 DIAGNOSIS — Z7901 Long term (current) use of anticoagulants: Secondary | ICD-10-CM

## 2021-11-10 DIAGNOSIS — I4891 Unspecified atrial fibrillation: Secondary | ICD-10-CM | POA: Diagnosis not present

## 2021-11-10 LAB — POCT INR: INR: 1.9 — AB (ref 2.0–3.0)

## 2021-11-10 NOTE — Telephone Encounter (Signed)
Notified Cecilia w/ MD response.Marland KitchenJohny Chess

## 2021-11-10 NOTE — Progress Notes (Signed)
Office Visit    Patient Name: Jon [name] Date of Encounter: 11/12/2021  Primary Care Provider:  [name], MD Primary Cardiologist:  None Primary Electrophysiologist: None  Chief Complaint    Jon [name] is a 76 y.o. male with PMH of PAF, HTN, DM type II, GERD, sleep apnea (on CPAP), tobacco abuse, CAD s/p CABG x3 with mitral valve replacement who presents today for [facility] visit of recent CABG.  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 pylori infection 2011   Heart murmur    History of radiation therapy 09/20/10 thru 10/29/10   gastroesophageal junction/gastric cardia   Hypertension    Neuromuscular disorder ([place])    Other and unspecified hyperlipidemia    Sleep apnea    On CPAP   Status post chemotherapy 09/22/10 thru 10/27/10   concurrent w/radiation   Unspecified sleep apnea    Past Surgical History:  Procedure Laterality Date   CATARACT EXTRACTION     bilateral   COLONOSCOPY     CORONARY ARTERY BYPASS GRAFT N/A 10/22/2021   Procedure: CORONARY ARTERY BYPASS GRAFTING (CABG) X THREE, USING LEFT INTERNAL MAMMARY ARTERY AND RIGHT LEG GREATER SAPHENOUS VEIN HARVESTED ENDOSCOPICALLY;  Surgeon: Coralie Common, MD;  Location: [place];  Service: Open Heart Surgery;  Laterality: N/A;   CORONARY BALLOON ANGIOPLASTY N/A 10/13/2021   Procedure: CORONARY BALLOON ANGIOPLASTY;  Surgeon: Sherren Mocha, MD;  Location: [place] [facility];  Service: Cardiovascular;  Laterality: N/A;   CORONARY BALLOON ANGIOPLASTY N/A 10/15/2021   Procedure: CORONARY BALLOON ANGIOPLASTY;  Surgeon: [name], MD;  Location: [place]  [facility];  Service: Cardiovascular;  Laterality: N/A;   ELBOW BURSA SURGERY     LEFT HEART CATH AND CORONARY ANGIOGRAPHY N/A 10/13/2021   Procedure: LEFT HEART CATH AND CORONARY ANGIOGRAPHY;  Surgeon: Sherren Mocha, MD;  Location: [place] [facility];  Service: Cardiovascular;  Laterality: N/A;   LEFT HEART CATH AND CORONARY ANGIOGRAPHY N/A 10/15/2021   Procedure: LEFT HEART CATH AND CORONARY ANGIOGRAPHY;  Surgeon: [name], MD;  Location: [place] [facility];  Service: Cardiovascular;  Laterality: N/A;   LEG SURGERY  1980   right leg    MAZE N/A 10/22/2021   Procedure: MAZE;  Surgeon: Coralie Common, MD;  Location: [place];  Service: Open Heart Surgery;  Laterality: N/A;   MITRAL VALVE REPAIR N/A 10/22/2021   Procedure: MITRAL VALVE REPAIR (MVR) USING MCCARTHY-ADAMS RING SIZE 30MM;  Surgeon: Coralie Common, MD;  Location: [place];  Service: Open Heart Surgery;  Laterality: N/A;   SKIN BIOPSY  08/22/11   shave biopsy =benign   TEE WITHOUT CARDIOVERSION N/A 10/22/2021   Procedure: TRANSESOPHAGEAL ECHOCARDIOGRAM (TEE);  Surgeon: Coralie Common, MD;  Location: [place];  Service: Open Heart Surgery;  Laterality: N/A;   TONSILLECTOMY      Allergies  No Known Allergies  History of Present Illness    Jon [name]  is a 76 year old male with the above mention past medical history who presents today for [facility] visit of recent CABG and MVR repair.  [name] was seen by Dr. Harrington Challenger in 2011 for management of atrial fibrillation and chest pressure.  He was lost to follow-up until 10/2021 when he was seen in the ED for complaint of substernal burning chest discomfort.  He was found to have AF with slow RVR and initial troponins were negative.  Patient signed out AMA and while walking the triage she is severe dizziness episode with diaphoresis and shortness of breath.  He was transported back to room and troponins trended up to 1100.  EKG completed which showed ST elevation in inferior leads with reciprocal  ST depression in V1-V3.  Patient had LHC performed that showed occluded posterior lateral branch and severe mid LAD lesion.  Patient had unsuccessful PCI to RPL V and LAD and was arranged for CABG.  2D echo was performed showing EF of 60-65%, no RWMA with inferior lateral severe hypokinesis and grade 2 DD with the regurgitation.  Patient underwent CABG x3 on 10/22/2021 with postop complicated by junctional rhythm requiring temporary pacing in the 50s with AF with RVR that required amiodarone infusion.  Due to persistent arrhythmias, EP consult was obtained.  They did not feel PPM placement was indicated.  The patient was found to be in NSR with some mild intraventricular delay.  Patient also has some bradycardia and nodal blocking agents were advised to not be given.  Patient was discharged with amiodarone and creatinine was stable.  He was seen by his PCP and was given prescription for torsemide due to lower extremity edema  [name] presents today for post PCI/CABG visit. Since last being seen in the office patient reports that he is doing much better and has no complaints of dizziness or bleeding issues with Coumadin.  He is currently walking in his driveway and around his home without any complaints.  He reports that he feels his strength coming back daily.  He is compliant with his current medication regimen and patient was euvolemic on exam with trace edema and left lower extremity and +2 and right lower extremity.  Patient was noted to have vein harvest from right lower extremity.  Patient denies any palpitations or accelerated heartbeats.  His blood pressure today was 96/62 with heart rate of 69 bpm.  Patient denies chest pain, palpitations, dyspnea, PND, orthopnea, nausea, vomiting, dizziness, syncope, edema, weight gain, or early satiety.  Home Medications    Current Outpatient Medications  Medication Sig Dispense Refill   acetaminophen (TYLENOL) 500 MG tablet Take 500-1,000 mg by mouth daily  as needed for headache or mild pain.     amiodarone (PACERONE) 200 MG tablet Take '200mg'$  twice per day for 7 days then take '200mg'$  once per day thereafter 60 tablet 1   aspirin EC 81 MG tablet Take 1 tablet (81 mg total) by mouth daily. Swallow whole. 30 tablet 12   atorvastatin (LIPITOR) 80 MG tablet Take 1 tablet (80 mg total) by mouth daily. 30 tablet 1   Cholecalciferol (VITAMIN D-3 PO) Take 1 capsule by mouth daily.     EPINEPHrine 0.3 mg/0.3 mL IJ SOAJ injection Inject 0.3 mg into the muscle as needed for anaphylaxis. 1 each 0   fenofibrate micronized (LOFIBRA) 134 MG capsule TAKE 1 CAPSULE BY MOUTH DAILY BEFORE AND BREAKFAST (Patient taking differently: Take 134 mg by mouth daily before breakfast.) 90 capsule 3   metFORMIN (GLUCOPHAGE) 1000 MG tablet TAKE 1 TABLET(1000 MG) BY MOUTH TWICE DAILY WITH A MEAL (Patient taking differently: Take 1,000 mg by mouth 2 (two)  times daily with a meal.) 180 tablet 2   midodrine (PROAMATINE) 5 MG tablet Take 3 tablets (15 mg total) by mouth 3 (three) times daily with meals. 120 tablet 2   Multiple Vitamin (MULTIVITAMIN WITH MINERALS) TABS tablet Take 1 tablet by mouth daily.     nateglinide (STARLIX) 120 MG tablet TAKE 1 TABLET(120 MG) BY MOUTH THREE TIMES DAILY WITH MEALS 270 tablet 1   omeprazole (PRILOSEC) 40 MG capsule TAKE 1 CAPSULE BY MOUTH DAILY (Patient taking differently: Take 40 mg by mouth daily.) 90 capsule 3   oxyCODONE (OXY IR/ROXICODONE) 5 MG immediate release tablet Take 1 tablet (5 mg total) by mouth every 4 (four) hours as needed for severe pain. 28 tablet 0   torsemide (DEMADEX) 10 MG tablet Take 1-2 tablets (10-20 mg total) by mouth daily as needed. 60 tablet 3   warfarin (COUMADIN) 2.5 MG tablet Take 1 tablet (2.5 mg total) by mouth daily. 30 tablet 3   No current facility-administered medications for this visit.     Review of Systems  Please see the history of present illness.    (+) Fatigue (+) Lower extremity edema  All other  systems reviewed and are otherwise negative except as noted above.  Physical Exam    Wt Readings from Last 3 Encounters:  11/12/21 189 lb (85.7 kg)  11/08/21 198 lb (89.8 kg)  11/08/21 201 lb (91.2 kg)   VS: Vitals:   11/12/21 1015  BP: 96/62  Pulse: 69  SpO2: 96%  ,Body mass index is 27.12 kg/m.  Constitutional:      Appearance: Healthy appearance. Not in distress.  Neck:     Vascular: JVD normal.  Pulmonary:     Effort: Pulmonary effort is normal.     Breath sounds: No wheezing. No rales. Diminished in the bases Cardiovascular:     Normal rate. Regular rhythm. Normal S1. Normal S2.      Murmurs: There is no murmur.  Edema:    Trace left lower extremity edema with chronic right lower extremity edema +2 Abdominal:     Palpations: Abdomen is soft non tender. There is no hepatomegaly.  Skin:    General: Skin is warm and dry.  Neurological:     General: No focal deficit present.     Mental Status: Alert and oriented to person, place and time.     Cranial Nerves: Cranial nerves are intact.  EKG/LABS/Other Studies Reviewed    ECG personally reviewed by me today -sinus rhythm with sinus arrhythmia with first-degree AV block and incomplete RBBB with no acute changes noted compared to previous EKG.  Risk Assessment/Calculations:    CHA2DS2-VASc Score = 4   This indicates a 4.8% annual risk of stroke. The patient's score is based upon: CHF History: 0 HTN History: 1 Diabetes History: 1 Stroke History: 0 Vascular Disease History: 0 Age Score: 2 Gender Score: 0        Lab Results  Component Value Date   WBC 11.2 (H) 11/01/2021   HGB 8.2 (L) 11/01/2021   HCT 25.1 (L) 11/01/2021   MCV 87.5 11/01/2021   PLT 652 (H) 11/01/2021   Lab Results  Component Value Date   CREATININE 1.28 (H) 11/02/2021   BUN 14 11/02/2021   NA 138 11/02/2021   K 3.8 11/02/2021   CL 107 11/02/2021   CO2 23 11/02/2021   Lab Results  Component Value Date   ALT 29 10/21/2021   AST 30  10/21/2021   ALKPHOS 49  10/21/2021   BILITOT 0.8 10/21/2021   Lab Results  Component Value Date   CHOL 135 10/14/2021   HDL 42 10/14/2021   LDLCALC 60 10/14/2021   LDLDIRECT 30.0 11/10/2020   TRIG 164 (H) 10/14/2021   CHOLHDL 3.2 10/14/2021    Lab Results  Component Value Date   HGBA1C 7.1 (H) 09/02/2021    Assessment & Plan    1.  Coronary artery disease: -s/p CABG x3 with mitral valve replacement -Today patient reports he is feeling much better and has no complaints of anginal chest pain or shortness of breath -Continue current GDMT with ASA 81 mg, atorvastatin 80 mg, -Patient is interested in participating in cardiac rehab and hopes to do so when cleared by thoracic surgery   2.  Mitral valve repair -2D echo completed with EF noted at 55% and no evidence of mitral stenosis with mitral valve peak gradient of 9 mmHg and mean gradient of 4 mmHg -SBE prophylaxis discussed for dental procedures -Continue Coumadin 2.5 mg with INR goal of 2-3  3.  Paroxysmal atrial fibrillation -Avoid nodal blocking agents due to bradycardia -Today patient is sinus rhythm rhythm with RBBB and rate of 69 bpm -Continue warfarin 2.5 mg as noted above -Continue amiodarone 200 mg daily CHA2DS2-VASc Score = 4 [CHF History: 0, HTN History: 1, Diabetes History: 1, Stroke History: 0, Vascular Disease History: 0, Age Score: 2, Gender Score: 0].  Therefore, the patient's annual risk of stroke is 4.8 %.      4.  Aortic stenosis: -Mild aortic stenosis noted by most recent 2D echo -Continue fluid volume management and blood pressure control  5.  Lower extremity edema: -Patient currently on torsemide 10 mg twice daily with improvement to left lower extremity and chronic swelling and right lower extremity -Patient advised to elevate extremities when dependent and abstain from excess salt in his diet.    Cardiac Rehabilitation Eligibility Assessment  The patient is ready to start cardiac rehabilitation  pending clearance from the cardiac surgeon.     Disposition: Follow-up with None or APP in 1 months    Medication Adjustments/Labs and Tests Ordered: Current medicines are reviewed at length with the patient today.  Concerns regarding medicines are outlined above.   Signed, Mable Fill, Marissa Nestle, NP 11/12/2021, 12:09 PM [facility]  Note:  This document was prepared using Dragon voice recognition software and may include unintentional dictation errors.

## 2021-11-10 NOTE — Telephone Encounter (Signed)
Okay.  Thanks.

## 2021-11-10 NOTE — Patient Instructions (Signed)
Description   Spoke with [name], [place] and advised to have pt take 1.5 tablets today then start taking 1 tablet daily EXCEPT 1/2 tablet on Tuesdays.  Stay consistent with greens each week. Recheck INR on Wednesday by [place] '200mg'$  BID X 7 days, then '200mg'$  daily*

## 2021-11-12 ENCOUNTER — Encounter: Payer: Self-pay | Admitting: Nurse Practitioner

## 2021-11-12 ENCOUNTER — Ambulatory Visit: Payer: Medicare Other | Attending: Nurse Practitioner | Admitting: Nurse Practitioner

## 2021-11-12 VITALS — BP 96/62 | HR 69 | Ht 70.0 in | Wt 189.0 lb

## 2021-11-12 DIAGNOSIS — I2583 Coronary atherosclerosis due to lipid rich plaque: Secondary | ICD-10-CM

## 2021-11-12 DIAGNOSIS — R600 Localized edema: Secondary | ICD-10-CM | POA: Diagnosis not present

## 2021-11-12 DIAGNOSIS — I059 Rheumatic mitral valve disease, unspecified: Secondary | ICD-10-CM

## 2021-11-12 DIAGNOSIS — I350 Nonrheumatic aortic (valve) stenosis: Secondary | ICD-10-CM

## 2021-11-12 DIAGNOSIS — I4891 Unspecified atrial fibrillation: Secondary | ICD-10-CM

## 2021-11-12 DIAGNOSIS — I2510 Atherosclerotic heart disease of native coronary artery without angina pectoris: Secondary | ICD-10-CM | POA: Diagnosis not present

## 2021-11-12 NOTE — Patient Instructions (Signed)
Medication Instructions:   Your physician recommends that you continue on your current medications as directed. Please refer to the Current Medication list given to you today.  *If you need a refill on your cardiac medications before your next appointment, please call your pharmacy*   Lab Work: [place]   If you have labs (blood work) drawn today and your tests are completely normal, you will receive your results only by: [place] (if you have MyChart) OR A paper copy in the mail If you have any lab test that is abnormal or we need to change your treatment, we will call you to review the results.   Testing/Procedures: NONE ORDERED  TODAY   Follow-Up: At [facility], you and your health needs are our priority.  As part of our continuing mission to provide you with exceptional heart care, we have created designated Provider Care Teams.  These Care Teams include your primary Cardiologist (physician) and Advanced Practice Providers (APPs -  Physician Assistants and Nurse Practitioners) who all work together to provide you with the care you need, when you need it.  We recommend signing up for the patient portal called "MyChart".  Sign up information is provided on this After Visit Summary.  MyChart is used to connect with patients for Virtual Visits (Telemedicine).  Patients are able to view lab/test results, encounter notes, upcoming appointments, etc.  Non-urgent messages can be sent to your provider as well.   To learn more about what you can do with MyChart, go to [email or web address].    Your next appointment:   1 month(s)  The format for your next appointment:   In Person  Provider:  Ambrose Pancoast NP   Other Instructions  WEIGHT DAILY CALL IF WEIGHT GAIN OF 3 LBS IN A DAY 5 LBS IN A  WEEK    Low-Sodium Eating Plan  Sodium, which is an element that makes up salt, helps you maintain a healthy balance of fluids in your body. Too much sodium can  increase your blood pressure and cause fluid and waste to be held in your body. Your health care provider or dietitian may recommend following this plan if you have high blood pressure (hypertension), kidney disease, liver disease, or heart failure. Eating less sodium can help lower your blood pressure, reduce swelling, and protect your heart, liver, and kidneys. What are tips for following this plan? Reading food labels The Nutrition Facts label lists the amount of sodium in one serving of the food. If you eat more than one serving, you must multiply the listed amount of sodium by the number of servings. Choose foods with less than 140 mg of sodium per serving. Avoid foods with 300 mg of sodium or more per serving. Shopping  Look for lower-sodium products, often labeled as "low-sodium" or "no salt added." Always check the sodium content, even if foods are labeled as "unsalted" or "no salt added." Buy fresh foods. Avoid canned foods and pre-made or frozen meals. Avoid canned, cured, or processed meats. Buy breads that have less than 80 mg of sodium per slice. Cooking  Eat more home-cooked food and less restaurant, buffet, and fast food. Avoid adding salt when cooking. Use salt-free seasonings or herbs instead of table salt or sea salt. Check with your health care provider or pharmacist before using salt substitutes. Cook with plant-based oils, such as canola, sunflower, or olive oil. Meal planning When eating at a restaurant, ask that your food be prepared with less  salt or no salt, if possible. Avoid dishes labeled as brined, pickled, cured, smoked, or made with soy sauce, miso, or teriyaki sauce. Avoid foods that contain MSG (monosodium glutamate). MSG is sometimes added to [place] food, bouillon, and some canned foods. Make meals that can be grilled, baked, poached, roasted, or steamed. These are generally made with less sodium. General information Most people on this plan should limit  their sodium intake to 1,500-2,000 mg (milligrams) of sodium each day. What foods should I eat? Fruits Fresh, frozen, or canned fruit. Fruit juice. Vegetables Fresh or frozen vegetables. "No salt added" canned vegetables. "No salt added" tomato sauce and paste. Low-sodium or reduced-sodium tomato and vegetable juice. Grains Low-sodium cereals, including oats, puffed wheat and rice, and shredded wheat. Low-sodium crackers. Unsalted rice. Unsalted pasta. Low-sodium bread. Whole-grain breads and whole-grain pasta. Meats and other proteins Fresh or frozen (no salt added) meat, poultry, seafood, and fish. Low-sodium canned tuna and salmon. Unsalted nuts. Dried peas, beans, and lentils without added salt. Unsalted canned beans. Eggs. Unsalted nut butters. Dairy Milk. Soy milk. Cheese that is naturally low in sodium, such as ricotta cheese, fresh mozzarella, or Swiss cheese. Low-sodium or reduced-sodium cheese. Cream cheese. Yogurt. Seasonings and condiments Fresh and dried herbs and spices. Salt-free seasonings. Low-sodium mustard and ketchup. Sodium-free salad dressing. Sodium-free light mayonnaise. Fresh or refrigerated horseradish. Lemon juice. Vinegar. Other foods Homemade, reduced-sodium, or low-sodium soups. Unsalted popcorn and pretzels. Low-salt or salt-free chips. The items listed above may not be a complete list of foods and beverages you can eat. Contact a dietitian for more information. What foods should I avoid? Vegetables Sauerkraut, pickled vegetables, and relishes. Olives. [place] fries. Onion rings. Regular canned vegetables (not low-sodium or reduced-sodium). Regular canned tomato sauce and paste (not low-sodium or reduced-sodium). Regular tomato and vegetable juice (not low-sodium or reduced-sodium). Frozen vegetables in sauces. Grains Instant hot cereals. Bread stuffing, pancake, and biscuit mixes. Croutons. Seasoned rice or pasta mixes. Noodle soup cups. Boxed or frozen macaroni  and cheese. Regular salted crackers. Self-rising flour. Meats and other proteins Meat or fish that is salted, canned, smoked, spiced, or pickled. Precooked or cured meat, such as sausages or meat loaves. [place]ce Salines. Ham. Pepperoni. Hot dogs. Corned beef. Chipped beef. Salt pork. Jerky. Pickled herring. Anchovies and sardines. Regular canned tuna. Salted nuts. Dairy Processed cheese and cheese spreads. Hard cheeses. Cheese curds. Blue cheese. Feta cheese. String cheese. Regular cottage cheese. Buttermilk. Canned milk. Fats and oils Salted butter. Regular margarine. Ghee. Bacon fat. Seasonings and condiments Onion salt, garlic salt, seasoned salt, table salt, and sea salt. Canned and packaged gravies. Worcestershire sauce. Tartar sauce. Barbecue sauce. Teriyaki sauce. Soy sauce, including reduced-sodium. Steak sauce. Fish sauce. Oyster sauce. Cocktail sauce. Horseradish that you find on the shelf. Regular ketchup and mustard. Meat flavorings and tenderizers. Bouillon cubes. Hot sauce. Pre-made or packaged marinades. Pre-made or packaged taco seasonings. Relishes. Regular salad dressings. Salsa. Other foods Salted popcorn and pretzels. Corn chips and puffs. Potato and tortilla chips. Canned or dried soups. Pizza. Frozen entrees and pot pies. The items listed above may not be a complete list of foods and beverages you should avoid. Contact a dietitian for more information. Summary Eating less sodium can help lower your blood pressure, reduce swelling, and protect your heart, liver, and kidneys. Most people on this plan should limit their sodium intake to 1,500-2,000 mg (milligrams) of sodium each day. Canned, boxed, and frozen foods are high in sodium. Restaurant foods, fast foods, and pizza are  also very high in sodium. You also get sodium by adding salt to food. Try to cook at home, eat more fresh fruits and vegetables, and eat less fast food and canned, processed, or prepared foods. This information is  not intended to replace advice given to you by your health care provider. Make sure you discuss any questions you have with your health care provider. Document Revised: 02/22/2019 Document Reviewed: 12/19/2018 Elsevier Patient Education  [place]

## 2021-11-15 DIAGNOSIS — H409 Unspecified glaucoma: Secondary | ICD-10-CM

## 2021-11-15 DIAGNOSIS — E119 Type 2 diabetes mellitus without complications: Secondary | ICD-10-CM

## 2021-11-15 DIAGNOSIS — I480 Paroxysmal atrial fibrillation: Secondary | ICD-10-CM | POA: Diagnosis not present

## 2021-11-15 DIAGNOSIS — Z79891 Long term (current) use of opiate analgesic: Secondary | ICD-10-CM

## 2021-11-15 DIAGNOSIS — I081 Rheumatic disorders of both mitral and tricuspid valves: Secondary | ICD-10-CM

## 2021-11-15 DIAGNOSIS — K5790 Diverticulosis of intestine, part unspecified, without perforation or abscess without bleeding: Secondary | ICD-10-CM

## 2021-11-15 DIAGNOSIS — D62 Acute posthemorrhagic anemia: Secondary | ICD-10-CM

## 2021-11-15 DIAGNOSIS — Z85028 Personal history of other malignant neoplasm of stomach: Secondary | ICD-10-CM

## 2021-11-15 DIAGNOSIS — K219 Gastro-esophageal reflux disease without esophagitis: Secondary | ICD-10-CM

## 2021-11-15 DIAGNOSIS — Z951 Presence of aortocoronary bypass graft: Secondary | ICD-10-CM

## 2021-11-15 DIAGNOSIS — I4892 Unspecified atrial flutter: Secondary | ICD-10-CM | POA: Diagnosis not present

## 2021-11-15 DIAGNOSIS — Z955 Presence of coronary angioplasty implant and graft: Secondary | ICD-10-CM

## 2021-11-15 DIAGNOSIS — I2119 ST elevation (STEMI) myocardial infarction involving other coronary artery of inferior wall: Secondary | ICD-10-CM | POA: Diagnosis not present

## 2021-11-15 DIAGNOSIS — R55 Syncope and collapse: Secondary | ICD-10-CM

## 2021-11-15 DIAGNOSIS — Z7982 Long term (current) use of aspirin: Secondary | ICD-10-CM

## 2021-11-15 DIAGNOSIS — I25110 Atherosclerotic heart disease of native coronary artery with unstable angina pectoris: Secondary | ICD-10-CM | POA: Diagnosis not present

## 2021-11-15 DIAGNOSIS — G4733 Obstructive sleep apnea (adult) (pediatric): Secondary | ICD-10-CM

## 2021-11-15 DIAGNOSIS — Z9221 Personal history of antineoplastic chemotherapy: Secondary | ICD-10-CM

## 2021-11-15 DIAGNOSIS — Z7901 Long term (current) use of anticoagulants: Secondary | ICD-10-CM

## 2021-11-15 DIAGNOSIS — Z87891 Personal history of nicotine dependence: Secondary | ICD-10-CM

## 2021-11-15 DIAGNOSIS — G709 Myoneural disorder, unspecified: Secondary | ICD-10-CM

## 2021-11-15 DIAGNOSIS — I119 Hypertensive heart disease without heart failure: Secondary | ICD-10-CM

## 2021-11-15 DIAGNOSIS — N179 Acute kidney failure, unspecified: Secondary | ICD-10-CM

## 2021-11-15 DIAGNOSIS — R001 Bradycardia, unspecified: Secondary | ICD-10-CM

## 2021-11-15 DIAGNOSIS — I214 Non-ST elevation (NSTEMI) myocardial infarction: Secondary | ICD-10-CM

## 2021-11-15 DIAGNOSIS — G5701 Lesion of sciatic nerve, right lower limb: Secondary | ICD-10-CM

## 2021-11-15 DIAGNOSIS — Z923 Personal history of irradiation: Secondary | ICD-10-CM

## 2021-11-15 DIAGNOSIS — Z48812 Encounter for surgical aftercare following surgery on the circulatory system: Secondary | ICD-10-CM

## 2021-11-17 ENCOUNTER — Ambulatory Visit (INDEPENDENT_AMBULATORY_CARE_PROVIDER_SITE_OTHER): Payer: Medicare Other | Admitting: *Deleted

## 2021-11-17 DIAGNOSIS — Z7901 Long term (current) use of anticoagulants: Secondary | ICD-10-CM | POA: Diagnosis not present

## 2021-11-17 DIAGNOSIS — I4891 Unspecified atrial fibrillation: Secondary | ICD-10-CM

## 2021-11-17 DIAGNOSIS — I059 Rheumatic mitral valve disease, unspecified: Secondary | ICD-10-CM

## 2021-11-17 LAB — POCT INR: INR: 1.8 — AB (ref 2.0–3.0)

## 2021-11-25 ENCOUNTER — Ambulatory Visit (INDEPENDENT_AMBULATORY_CARE_PROVIDER_SITE_OTHER): Payer: Medicare Other

## 2021-11-25 DIAGNOSIS — I059 Rheumatic mitral valve disease, unspecified: Secondary | ICD-10-CM

## 2021-11-25 DIAGNOSIS — I4891 Unspecified atrial fibrillation: Secondary | ICD-10-CM

## 2021-11-25 DIAGNOSIS — Z7901 Long term (current) use of anticoagulants: Secondary | ICD-10-CM | POA: Diagnosis not present

## 2021-11-25 LAB — POCT INR: INR: 2.2 (ref 2.0–3.0)

## 2021-11-25 NOTE — Patient Instructions (Signed)
Description   Spoke with [name], [place] and advised to have pt continue taking 1 tablet daily.  Stay consistent with greens each week.  Recheck INR in 1 week by [facility]  [facility] [phone]  * Amio '200mg'$  BID X 7 days, then '200mg'$  daily*

## 2021-11-29 ENCOUNTER — Ambulatory Visit (INDEPENDENT_AMBULATORY_CARE_PROVIDER_SITE_OTHER): Payer: Self-pay | Admitting: Thoracic Surgery (Cardiothoracic Vascular Surgery)

## 2021-11-29 ENCOUNTER — Other Ambulatory Visit: Payer: Self-pay | Admitting: Thoracic Surgery (Cardiothoracic Vascular Surgery)

## 2021-11-29 ENCOUNTER — Encounter: Payer: Self-pay | Admitting: Thoracic Surgery (Cardiothoracic Vascular Surgery)

## 2021-11-29 ENCOUNTER — Ambulatory Visit
Admission: RE | Admit: 2021-11-29 | Discharge: 2021-11-29 | Disposition: A | Discharge status: Home / Self Care | Payer: Medicare Other | Source: Ambulatory Visit | Attending: Thoracic Surgery (Cardiothoracic Vascular Surgery) | Admitting: Thoracic Surgery (Cardiothoracic Vascular Surgery)

## 2021-11-29 VITALS — BP 108/74 | HR 61 | Resp 20 | Ht 70.0 in | Wt 186.1 lb

## 2021-11-29 DIAGNOSIS — Z951 Presence of aortocoronary bypass graft: Secondary | ICD-10-CM

## 2021-11-29 DIAGNOSIS — R918 Other nonspecific abnormal finding of lung field: Secondary | ICD-10-CM | POA: Diagnosis not present

## 2021-11-29 DIAGNOSIS — Z8709 Personal history of other diseases of the respiratory system: Secondary | ICD-10-CM | POA: Diagnosis not present

## 2021-11-29 NOTE — Progress Notes (Signed)
     [address]       [place],[address] [address]             [phone]        POST OP VISIT  Pt is sp CABG and MV repair and Maze procedure and is approximately 6 weeks out from the procedure The pt is doing well without CP or SOB. He had been recently treated with diuretics for lower ext edema that has resolved He has seen Cardiology and is planning on seeing them in a month and cardiac rehab has been arranged  EXAM Lungs: clear Card: RR with soft systolic murmur Ext: no edema Wounds well healed  INR last was 2.2  A/P S/P CABG/MV repair/ Maze He is doing well. CXR is clear. I have discussed restrictions and at this point he can drive and begin lifting over 10lbs gradually moving forward.  I have encouraged cardiac rehab I have discussed that his amiodorone medication and anticoagulation will be managed by Cardiology and that hopefully if he maintains his NSR, he can come off coumadin and go to ASA/Plavix for a year then ASA moving forward. If he has issues with recurrent afib, a noac would be acceptable if agreed upon by Cardiology Pt was instructed to return to this office as needed  I have spent over 30 min in this post op visit

## 2021-12-02 ENCOUNTER — Ambulatory Visit (INDEPENDENT_AMBULATORY_CARE_PROVIDER_SITE_OTHER): Payer: Medicare Other | Admitting: Pharmacist

## 2021-12-02 DIAGNOSIS — I059 Rheumatic mitral valve disease, unspecified: Secondary | ICD-10-CM | POA: Diagnosis not present

## 2021-12-02 DIAGNOSIS — Z7901 Long term (current) use of anticoagulants: Secondary | ICD-10-CM | POA: Diagnosis not present

## 2021-12-02 DIAGNOSIS — I4891 Unspecified atrial fibrillation: Secondary | ICD-10-CM | POA: Diagnosis not present

## 2021-12-02 LAB — POCT INR: INR: 1.7 — AB (ref 2.0–3.0)

## 2021-12-02 NOTE — Patient Instructions (Signed)
Description   Spoke with [name], [place] and advised to have pt continue take 2 tablets today and then continue 1 tablet daily.  Stay consistent with greens each week.  Recheck INR in 1 week by [facility]  Coumadin Clinic [phone]  * Amio '200mg'$  BID X 7 days, then '200mg'$  daily*

## 2021-12-03 ENCOUNTER — Ambulatory Visit (HOSPITAL_COMMUNITY): Payer: Medicare Other | Attending: Internal Medicine

## 2021-12-03 DIAGNOSIS — I4892 Unspecified atrial flutter: Secondary | ICD-10-CM | POA: Diagnosis not present

## 2021-12-03 DIAGNOSIS — Z79891 Long term (current) use of opiate analgesic: Secondary | ICD-10-CM | POA: Diagnosis not present

## 2021-12-03 DIAGNOSIS — I361 Nonrheumatic tricuspid (valve) insufficiency: Secondary | ICD-10-CM | POA: Diagnosis not present

## 2021-12-03 DIAGNOSIS — I4891 Unspecified atrial fibrillation: Secondary | ICD-10-CM | POA: Insufficient documentation

## 2021-12-03 DIAGNOSIS — K219 Gastro-esophageal reflux disease without esophagitis: Secondary | ICD-10-CM | POA: Diagnosis not present

## 2021-12-03 DIAGNOSIS — E119 Type 2 diabetes mellitus without complications: Secondary | ICD-10-CM | POA: Insufficient documentation

## 2021-12-03 DIAGNOSIS — Z951 Presence of aortocoronary bypass graft: Secondary | ICD-10-CM | POA: Insufficient documentation

## 2021-12-03 DIAGNOSIS — Z9221 Personal history of antineoplastic chemotherapy: Secondary | ICD-10-CM | POA: Diagnosis not present

## 2021-12-03 DIAGNOSIS — R55 Syncope and collapse: Secondary | ICD-10-CM | POA: Diagnosis not present

## 2021-12-03 DIAGNOSIS — I119 Hypertensive heart disease without heart failure: Secondary | ICD-10-CM | POA: Diagnosis not present

## 2021-12-03 DIAGNOSIS — I2119 ST elevation (STEMI) myocardial infarction involving other coronary artery of inferior wall: Secondary | ICD-10-CM | POA: Diagnosis not present

## 2021-12-03 DIAGNOSIS — G5701 Lesion of sciatic nerve, right lower limb: Secondary | ICD-10-CM | POA: Diagnosis not present

## 2021-12-03 DIAGNOSIS — I10 Essential (primary) hypertension: Secondary | ICD-10-CM | POA: Insufficient documentation

## 2021-12-03 DIAGNOSIS — Z9889 Other specified postprocedural states: Secondary | ICD-10-CM | POA: Diagnosis not present

## 2021-12-03 DIAGNOSIS — G709 Myoneural disorder, unspecified: Secondary | ICD-10-CM | POA: Diagnosis not present

## 2021-12-03 DIAGNOSIS — Z923 Personal history of irradiation: Secondary | ICD-10-CM | POA: Diagnosis not present

## 2021-12-03 DIAGNOSIS — G4733 Obstructive sleep apnea (adult) (pediatric): Secondary | ICD-10-CM | POA: Diagnosis not present

## 2021-12-03 DIAGNOSIS — I480 Paroxysmal atrial fibrillation: Secondary | ICD-10-CM | POA: Diagnosis not present

## 2021-12-03 DIAGNOSIS — I081 Rheumatic disorders of both mitral and tricuspid valves: Secondary | ICD-10-CM | POA: Diagnosis not present

## 2021-12-03 DIAGNOSIS — I25110 Atherosclerotic heart disease of native coronary artery with unstable angina pectoris: Secondary | ICD-10-CM | POA: Diagnosis not present

## 2021-12-03 DIAGNOSIS — Z7982 Long term (current) use of aspirin: Secondary | ICD-10-CM | POA: Diagnosis not present

## 2021-12-03 DIAGNOSIS — H409 Unspecified glaucoma: Secondary | ICD-10-CM | POA: Diagnosis not present

## 2021-12-03 DIAGNOSIS — D62 Acute posthemorrhagic anemia: Secondary | ICD-10-CM | POA: Diagnosis not present

## 2021-12-03 DIAGNOSIS — N179 Acute kidney failure, unspecified: Secondary | ICD-10-CM | POA: Diagnosis not present

## 2021-12-03 DIAGNOSIS — Z87891 Personal history of nicotine dependence: Secondary | ICD-10-CM | POA: Diagnosis not present

## 2021-12-03 DIAGNOSIS — Z48812 Encounter for surgical aftercare following surgery on the circulatory system: Secondary | ICD-10-CM | POA: Diagnosis not present

## 2021-12-03 DIAGNOSIS — R001 Bradycardia, unspecified: Secondary | ICD-10-CM | POA: Diagnosis not present

## 2021-12-03 DIAGNOSIS — Z85028 Personal history of other malignant neoplasm of stomach: Secondary | ICD-10-CM | POA: Diagnosis not present

## 2021-12-03 DIAGNOSIS — Z7901 Long term (current) use of anticoagulants: Secondary | ICD-10-CM | POA: Diagnosis not present

## 2021-12-03 DIAGNOSIS — K5790 Diverticulosis of intestine, part unspecified, without perforation or abscess without bleeding: Secondary | ICD-10-CM | POA: Diagnosis not present

## 2021-12-03 LAB — ECHOCARDIOGRAM COMPLETE
Area-P 1/2: 2.13 cm2
MV VTI: 1.47 cm2
S' Lateral: 2.5 cm

## 2021-12-07 NOTE — Progress Notes (Signed)
Office Visit    Patient Name: Jon [name] Date of Encounter: 12/07/2021  Primary Care Provider:  [name], MD Primary Cardiologist:  None Primary Electrophysiologist: None  Chief Complaint    Jon [name] is a 76 y.o. male with PMH of PAF, HTN, DM type II, GERD, sleep apnea (on CPAP), tobacco abuse, CAD s/p CABG x3 with mitral valve replacement who presents today for 1 month  visit of recent CABG.   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 pylori infection 2011   Heart murmur    History of radiation therapy 09/20/10 thru 10/29/10   gastroesophageal junction/gastric cardia   Hypertension    Neuromuscular disorder ([place])    Other and unspecified hyperlipidemia    Sleep apnea    On CPAP   Status post chemotherapy 09/22/10 thru 10/27/10   concurrent w/radiation   Unspecified sleep apnea    Past Surgical History:  Procedure Laterality Date   CATARACT EXTRACTION     bilateral   COLONOSCOPY     CORONARY ARTERY BYPASS GRAFT N/A 10/22/2021   Procedure: CORONARY ARTERY BYPASS GRAFTING (CABG) X THREE, USING LEFT INTERNAL MAMMARY ARTERY AND RIGHT LEG GREATER SAPHENOUS VEIN HARVESTED ENDOSCOPICALLY;  Surgeon: Coralie Common, MD;  Location: [place];  Service: Open Heart Surgery;  Laterality: N/A;   CORONARY BALLOON ANGIOPLASTY N/A 10/13/2021   Procedure: CORONARY BALLOON ANGIOPLASTY;  Surgeon: Sherren Mocha, MD;  Location: [place] [facility];  Service: Cardiovascular;  Laterality: N/A;   CORONARY BALLOON ANGIOPLASTY N/A 10/15/2021   Procedure: CORONARY BALLOON ANGIOPLASTY;  Surgeon: [name], MD;  Location: [facility];  Service: Cardiovascular;  Laterality: N/A;   ELBOW BURSA SURGERY     LEFT HEART CATH AND CORONARY ANGIOGRAPHY N/A 10/13/2021   Procedure: LEFT HEART CATH AND CORONARY ANGIOGRAPHY;  Surgeon: Sherren Mocha, MD;  Location: [place] [facility];  Service: Cardiovascular;  Laterality: N/A;   LEFT HEART CATH AND CORONARY ANGIOGRAPHY N/A 10/15/2021   Procedure: LEFT HEART CATH AND CORONARY ANGIOGRAPHY;  Surgeon: [name], MD;  Location: [place] [facility];  Service: Cardiovascular;  Laterality: N/A;   LEG SURGERY  1980   right leg    MAZE N/A 10/22/2021   Procedure: MAZE;  Surgeon: Coralie Common, MD;  Location: [place];  Service: Open Heart Surgery;  Laterality: N/A;   MITRAL VALVE REPAIR N/A 10/22/2021   Procedure: MITRAL VALVE REPAIR (MVR) USING MCCARTHY-ADAMS RING SIZE 30MM;  Surgeon: Coralie Common, MD;  Location: [facility];  Service: Open Heart Surgery;  Laterality: N/A;   SKIN BIOPSY  08/22/11   shave biopsy =benign   TEE WITHOUT CARDIOVERSION N/A 10/22/2021   Procedure: TRANSESOPHAGEAL ECHOCARDIOGRAM (TEE);  Surgeon: Coralie Common, MD;  Location: [place];  Service: Open Heart Surgery;  Laterality: N/A;   TONSILLECTOMY      Allergies  No Known Allergies  History of Present Illness    Jon [name]  is a 76 year old male with the above mention past medical history who presents today for [facility] visit of recent CABG and  MVR repair.  Jon [name] was seen by Dr. Harrington Challenger in 2011 for management of atrial fibrillation and chest pressure.  He was lost to follow-up until 10/2021 when he was seen in the ED for complaint of substernal burning chest discomfort.  He was found to have AF with slow RVR and initial troponins were negative.  Patient signed out AMA and while walking the triage she is severe dizziness episode with diaphoresis and shortness of breath.  He was transported back to room and troponins trended up to 1100.  EKG completed which showed ST elevation in inferior leads with  reciprocal ST depression in V1-V3.  Patient had LHC performed that showed occluded posterior lateral branch and severe mid LAD lesion.  Patient had unsuccessful PCI to RPL V and LAD and was arranged for CABG.  2D echo was performed showing EF of 60-65%, no RWMA with inferior lateral severe hypokinesis and grade 2 DD with the regurgitation.  Patient underwent CABG x3 on 10/22/2021 with postop complicated by junctional rhythm requiring temporary pacing in the 50s with AF with RVR that required amiodarone infusion.  Due to persistent arrhythmias, EP consult was obtained.  They did not feel PPM placement was indicated.  The patient was found to be in NSR with some mild intraventricular delay.  Patient also has some bradycardia and nodal blocking agents were advised to not be given.  Patient was discharged with amiodarone and creatinine was stable.  He was seen by his PCP and was given prescription for torsemide due to lower extremity edema.  He was seen in follow-up on 11/12/2021 and denied any complaints of dizziness or bleeding with Coumadin.  He was walking in his driveway and was referred to cardiac rehab.  Blood pressure was stable at 96/62 with a rate of 69 bpm.  He was seen by Dr. Lavonna Monarch on 10/30 and was granted clearance for driving and lifting 10 pounds.  He will require ASA and Plavix for 1 year and if maintains sinus rhythm can discontinue Coumadin.   Jon [name] presents today for 1 month follow-up alone.  Since last being seen in the office patient reports that he is doing much better and denies any chest pain or palpitations.  He is drinking at least 64 ounces of liquid per day and denies any complaints of dizziness.  He did note some swelling yesterday and used his torsemide 10 mg twice daily with improvement.  He is planning to start cardiac rehab and is cleared to begin that program per Dr. Lavonna Monarch.  His blood pressures was initially 90/60 was 94/68 on recheck.  He was a little dehydrated from  using torsemide yesterday.  He is staying active and walks to get his mail daily the length of a football field per his estimation.  He is patient denies chest pain, palpitations, dyspnea, PND, orthopnea, nausea, vomiting, dizziness, syncope, edema, weight gain, or early satiety.  Home Medications    Current Outpatient Medications  Medication Sig Dispense Refill   acetaminophen (TYLENOL) 500 MG tablet Take 500-1,000 mg by mouth daily as needed for headache or mild pain.     amiodarone (PACERONE) 200 MG tablet Take '200mg'$  twice per day for 7 days then take '200mg'$  once per day thereafter 60 tablet 1   aspirin EC 81 MG tablet Take 1 tablet (81 mg total) by mouth daily. Swallow whole. 30 tablet 12   atorvastatin (LIPITOR) 80 MG tablet Take 1 tablet (80 mg total) by mouth daily. 30 tablet 1  Cholecalciferol (VITAMIN D-3 PO) Take 1 capsule by mouth daily.     EPINEPHrine 0.3 mg/0.3 mL IJ SOAJ injection Inject 0.3 mg into the muscle as needed for anaphylaxis. 1 each 0   fenofibrate micronized (LOFIBRA) 134 MG capsule TAKE 1 CAPSULE BY MOUTH DAILY BEFORE AND BREAKFAST (Patient taking differently: Take 134 mg by mouth daily before breakfast.) 90 capsule 3   metFORMIN (GLUCOPHAGE) 1000 MG tablet TAKE 1 TABLET(1000 MG) BY MOUTH TWICE DAILY WITH A MEAL (Patient taking differently: Take 1,000 mg by mouth 2 (two) times daily with a meal.) 180 tablet 2   midodrine (PROAMATINE) 5 MG tablet Take 3 tablets (15 mg total) by mouth 3 (three) times daily with meals. 120 tablet 2   Multiple Vitamin (MULTIVITAMIN WITH MINERALS) TABS tablet Take 1 tablet by mouth daily.     nateglinide (STARLIX) 120 MG tablet TAKE 1 TABLET(120 MG) BY MOUTH THREE TIMES DAILY WITH MEALS 270 tablet 1   omeprazole (PRILOSEC) 40 MG capsule TAKE 1 CAPSULE BY MOUTH DAILY (Patient taking differently: Take 40 mg by mouth daily.) 90 capsule 3   oxyCODONE (OXY IR/ROXICODONE) 5 MG immediate release tablet Take 1 tablet (5 mg total) by mouth every 4  (four) hours as needed for severe pain. 28 tablet 0   torsemide (DEMADEX) 10 MG tablet Take 1-2 tablets (10-20 mg total) by mouth daily as needed. 60 tablet 3   warfarin (COUMADIN) 2.5 MG tablet Take 1 tablet (2.5 mg total) by mouth daily. 30 tablet 3   No current facility-administered medications for this visit.     Review of Systems  Please see the history of present illness.     All other systems reviewed and are otherwise negative except as noted above.  Physical Exam    Wt Readings from Last 3 Encounters:  11/29/21 186 lb 2.2 oz (84.4 kg)  11/12/21 189 lb (85.7 kg)  11/08/21 198 lb (89.8 kg)   DG:LOVFI were no vitals filed for this visit.,There is no height or weight on file to calculate BMI.  Constitutional:      Appearance: Healthy appearance. Not in distress.  Neck:     Vascular: JVD normal.  Pulmonary:     Effort: Pulmonary effort is normal.     Breath sounds: No wheezing. No rales. Diminished in the bases Cardiovascular:     Normal rate. Regular rhythm. Normal S1. Normal S2.      Murmurs: There is no murmur.  Edema:    Peripheral edema absent.  Abdominal:     Palpations: Abdomen is soft non tender. There is no hepatomegaly.  Skin:    General: Skin is warm and dry.  Neurological:     General: No focal deficit present.     Mental Status: Alert and oriented to person, place and time.     Cranial Nerves: Cranial nerves are intact.  EKG/LABS/Other Studies Reviewed    ECG personally reviewed by me today -none completed today  Risk Assessment/Calculations:      Lab Results  Component Value Date   WBC 11.2 (H) 11/01/2021   HGB 8.2 (L) 11/01/2021   HCT 25.1 (L) 11/01/2021   MCV 87.5 11/01/2021   PLT 652 (H) 11/01/2021   Lab Results  Component Value Date   CREATININE 1.28 (H) 11/02/2021   BUN 14 11/02/2021   NA 138 11/02/2021   K 3.8 11/02/2021   CL 107 11/02/2021   CO2 23 11/02/2021   Lab Results  Component Value Date   ALT  29 10/21/2021   AST 30  10/21/2021   ALKPHOS 49 10/21/2021   BILITOT 0.8 10/21/2021   Lab Results  Component Value Date   CHOL 135 10/14/2021   HDL 42 10/14/2021   LDLCALC 60 10/14/2021   LDLDIRECT 30.0 11/10/2020   TRIG 164 (H) 10/14/2021   CHOLHDL 3.2 10/14/2021    Lab Results  Component Value Date   HGBA1C 7.1 (H) 09/02/2021    Assessment & Plan    1.  Coronary artery disease: -s/p CABG x3 with mitral valve replacement -Today patient reports he is feeling much better and has no complaints of anginal chest pain or shortness of breath -Continue current GDMT with ASA 81 mg, atorvastatin 80 mg, -He is interested in beginning cardiac rehab at this time.   2.  Mitral valve repair -2D echo completed with EF noted at 55% and no evidence of mitral stenosis with mitral valve peak gradient of 9 mmHg and mean gradient of 4 mmHg -SBE prophylaxis discussed for dental procedures -Continue Coumadin 2.5 mg with INR goal of 2-3   3.  Paroxysmal atrial fibrillation -Avoid nodal blocking agents due to bradycardia -Today patient is sinus rhythm rhythm with RBBB and rate of 69 bpm -Continue warfarin 2.5 mg as noted above -Continue amiodarone 200 mg daily CHA2DS2-VASc Score = 4 [CHF History: 0, HTN History: 1, Diabetes History: 1, Stroke History: 0, Vascular Disease History: 0, Age Score: 2, Gender Score: 0].  Therefore, the patient's annual risk of stroke is 4.8 %.       4.  Aortic stenosis: -Mild aortic stenosis noted by most recent 2D echo -Continue fluid volume management and blood pressure control   5.  Lower extremity edema: -Patient's lower extremity edema has resolved and is currently taking torsemide as needed. -He is encouraged to use compression stockings and elevate feet when dependent.    Cardiac Rehabilitation Eligibility Assessment  The patient is ready to start cardiac rehabilitation pending clearance from the cardiac surgeon.     Disposition: Follow-up with None or APP in 3 months      Medication Adjustments/Labs and Tests Ordered: Current medicines are reviewed at length with the patient today.  Concerns regarding medicines are outlined above.   Signed, Mable Fill, Marissa Nestle, NP 12/07/2021, 1:55 PM [facility]  Note:  This document was prepared using Dragon voice recognition software and may include unintentional dictation errors.

## 2021-12-09 ENCOUNTER — Ambulatory Visit (INDEPENDENT_AMBULATORY_CARE_PROVIDER_SITE_OTHER): Payer: Medicare Other | Admitting: *Deleted

## 2021-12-09 DIAGNOSIS — Z7901 Long term (current) use of anticoagulants: Secondary | ICD-10-CM

## 2021-12-09 DIAGNOSIS — I4891 Unspecified atrial fibrillation: Secondary | ICD-10-CM | POA: Diagnosis not present

## 2021-12-09 DIAGNOSIS — I059 Rheumatic mitral valve disease, unspecified: Secondary | ICD-10-CM | POA: Diagnosis not present

## 2021-12-09 LAB — POCT INR: INR: 1.5 — AB (ref 2.0–3.0)

## 2021-12-09 NOTE — Patient Instructions (Signed)
Description   Spoke with [name], [place] and advised to have pt take 2 tablets today and then start taking 1 tablet daily except 2 tablets on Sundays. Stay consistent with greens each week. Recheck INR in 1 week by [facility]  Coumadin [facility] [phone]  * Amio '200mg'$  BID X 7 days, then '200mg'$  daily*

## 2021-12-10 ENCOUNTER — Encounter: Payer: Self-pay | Admitting: Nurse Practitioner

## 2021-12-10 ENCOUNTER — Ambulatory Visit: Payer: Medicare Other | Attending: Nurse Practitioner | Admitting: Nurse Practitioner

## 2021-12-10 VITALS — BP 94/68 | HR 71 | Ht 70.0 in | Wt 186.2 lb

## 2021-12-10 DIAGNOSIS — I480 Paroxysmal atrial fibrillation: Secondary | ICD-10-CM

## 2021-12-10 DIAGNOSIS — I2510 Atherosclerotic heart disease of native coronary artery without angina pectoris: Secondary | ICD-10-CM

## 2021-12-10 DIAGNOSIS — I2583 Coronary atherosclerosis due to lipid rich plaque: Secondary | ICD-10-CM

## 2021-12-10 DIAGNOSIS — I059 Rheumatic mitral valve disease, unspecified: Secondary | ICD-10-CM

## 2021-12-10 DIAGNOSIS — R600 Localized edema: Secondary | ICD-10-CM

## 2021-12-10 DIAGNOSIS — Z9889 Other specified postprocedural states: Secondary | ICD-10-CM

## 2021-12-10 NOTE — Patient Instructions (Addendum)
Medication Instructions:   . Your physician recommends that you continue on your current medications as directed. Please refer to the Current Medication list given to you today.   *If you need a refill on your cardiac medications before your next appointment, please call your pharmacy*   Lab Work: [place]    If you have labs (blood work) drawn today and your tests are completely normal, you will receive your results only by: [place] (if you have MyChart) OR A paper copy in the mail If you have any lab test that is abnormal or we need to change your treatment, we will call you to review the results.   Testing/Procedures: NONE ORDERED  TODAY    Follow-Up: At [facility], you and your health needs are our priority.  As part of our continuing mission to provide you with exceptional heart care, we have created designated Provider Care Teams.  These Care Teams include your primary Cardiologist (physician) and Advanced Practice Providers (APPs -  Physician Assistants and Nurse Practitioners) who all work together to provide you with the care you need, when you need it.  We recommend signing up for the patient portal called "MyChart".  Sign up information is provided on this After Visit Summary.  MyChart is used to connect with patients for Virtual Visits (Telemedicine).  Patients are able to view lab/test results, encounter notes, upcoming appointments, etc.  Non-urgent messages can be sent to your provider as well.   To learn more about what you can do with MyChart, go to [email or web address].    Your next appointment:   3 month(s)  The format for your next appointment:   In Person  Provider:   Ambrose Pancoast, NP     Other Instructions   Important Information About Sugar

## 2021-12-16 ENCOUNTER — Other Ambulatory Visit: Payer: Self-pay

## 2021-12-16 ENCOUNTER — Other Ambulatory Visit: Payer: Self-pay | Admitting: Physician Assistant

## 2021-12-16 ENCOUNTER — Ambulatory Visit (INDEPENDENT_AMBULATORY_CARE_PROVIDER_SITE_OTHER): Payer: Medicare Other

## 2021-12-16 DIAGNOSIS — I480 Paroxysmal atrial fibrillation: Secondary | ICD-10-CM

## 2021-12-16 DIAGNOSIS — Z5181 Encounter for therapeutic drug level monitoring: Secondary | ICD-10-CM | POA: Diagnosis not present

## 2021-12-16 LAB — POCT INR: INR: 1.6 — AB (ref 2.0–3.0)

## 2021-12-16 MED ORDER — WARFARIN SODIUM 2.5 MG PO TABS: ORAL_TABLET | ORAL | 2 refills | Status: DC

## 2021-12-16 NOTE — Telephone Encounter (Signed)
Prescription refill request for Eliquis received. Indication:Afib  Last office visit: 12/10/21 Barbarann Ehlers) Scr: 1.28 (11/02/21)  Age: 76 Weight: 84.5kg  Appropriate dose and refill sent to requested pharmacy.

## 2021-12-16 NOTE — Patient Instructions (Signed)
Description   Spoke with [name], [place] and advised to have pt take 2 tablets today and then START taking 1 tablet daily except 2 tablets on Sundays and Tuesdays.  Stay consistent with greens each week.  Recheck INR in 1 week by [place] '200mg'$  BID X 7 days, then '200mg'$  daily*

## 2021-12-22 ENCOUNTER — Ambulatory Visit (INDEPENDENT_AMBULATORY_CARE_PROVIDER_SITE_OTHER): Payer: Medicare Other

## 2021-12-22 DIAGNOSIS — Z7901 Long term (current) use of anticoagulants: Secondary | ICD-10-CM | POA: Diagnosis not present

## 2021-12-22 DIAGNOSIS — I059 Rheumatic mitral valve disease, unspecified: Secondary | ICD-10-CM

## 2021-12-22 DIAGNOSIS — I4891 Unspecified atrial fibrillation: Secondary | ICD-10-CM

## 2021-12-22 LAB — POCT INR: INR: 1.7 — AB (ref 2.0–3.0)

## 2021-12-22 NOTE — Patient Instructions (Signed)
Description   Spoke with [name], [place] and advised to have pt take 2 tablets today and then START taking 2 tablets daily except 1 tablet on Mondays, Wednesdays, and Fridays.  Stay consistent with greens each week.  Recheck INR in 1 week by [facility] [phone]  * Amio '200mg'$  BID X 7 days, then '200mg'$  daily*

## 2021-12-29 ENCOUNTER — Encounter: Payer: Self-pay | Admitting: Internal Medicine

## 2021-12-29 ENCOUNTER — Ambulatory Visit (INDEPENDENT_AMBULATORY_CARE_PROVIDER_SITE_OTHER): Payer: Medicare Other

## 2021-12-29 ENCOUNTER — Ambulatory Visit (INDEPENDENT_AMBULATORY_CARE_PROVIDER_SITE_OTHER): Payer: Medicare Other | Admitting: Internal Medicine

## 2021-12-29 VITALS — BP 120/72 | HR 59 | Temp 98.3°F | Ht 70.0 in | Wt 188.0 lb

## 2021-12-29 DIAGNOSIS — N5089 Other specified disorders of the male genital organs: Secondary | ICD-10-CM

## 2021-12-29 DIAGNOSIS — K4090 Unilateral inguinal hernia, without obstruction or gangrene, not specified as recurrent: Secondary | ICD-10-CM

## 2021-12-29 DIAGNOSIS — R600 Localized edema: Secondary | ICD-10-CM

## 2021-12-29 DIAGNOSIS — I2510 Atherosclerotic heart disease of native coronary artery without angina pectoris: Secondary | ICD-10-CM

## 2021-12-29 DIAGNOSIS — E1159 Type 2 diabetes mellitus with other circulatory complications: Secondary | ICD-10-CM

## 2021-12-29 DIAGNOSIS — Z7901 Long term (current) use of anticoagulants: Secondary | ICD-10-CM

## 2021-12-29 DIAGNOSIS — M791 Myalgia, unspecified site: Secondary | ICD-10-CM

## 2021-12-29 DIAGNOSIS — Z951 Presence of aortocoronary bypass graft: Secondary | ICD-10-CM

## 2021-12-29 DIAGNOSIS — I2583 Coronary atherosclerosis due to lipid rich plaque: Secondary | ICD-10-CM

## 2021-12-29 DIAGNOSIS — E785 Hyperlipidemia, unspecified: Secondary | ICD-10-CM | POA: Diagnosis not present

## 2021-12-29 DIAGNOSIS — I4891 Unspecified atrial fibrillation: Secondary | ICD-10-CM | POA: Diagnosis not present

## 2021-12-29 DIAGNOSIS — I059 Rheumatic mitral valve disease, unspecified: Secondary | ICD-10-CM | POA: Diagnosis not present

## 2021-12-29 DIAGNOSIS — R531 Weakness: Secondary | ICD-10-CM

## 2021-12-29 LAB — POCT INR: INR: 2.5 (ref 2.0–3.0)

## 2021-12-29 NOTE — Assessment & Plan Note (Signed)
S/p CABG x3 on Sept 22, 2023

## 2021-12-29 NOTE — Assessment & Plan Note (Signed)
Cont on Metformin, Starlix Cont on Lipitor, fenofibrate

## 2021-12-29 NOTE — Assessment & Plan Note (Addendum)
R groin small hernia, NT Get support athletic briefs

## 2021-12-29 NOTE — Patient Instructions (Signed)
Description   Spoke with [name], [place] and advised to have pt continue taking 2 tablets daily except 1 tablet on Mondays, Wednesdays, and Fridays.  Stay consistent with greens each week.  Recheck INR in 1 week by [facility]  Coumadin Clinic [phone] * Amio '200mg'$  BID X 7 days, then '200mg'$  daily*

## 2021-12-29 NOTE — Patient Instructions (Addendum)
Get support athletic briefs  Stop fenofibrate   Take Torsemide daily x 7-10 days

## 2021-12-29 NOTE — Assessment & Plan Note (Signed)
Stop fenofibrate

## 2021-12-29 NOTE — Assessment & Plan Note (Signed)
Take Torsemide daily x 7-10 days

## 2021-12-29 NOTE — Assessment & Plan Note (Signed)
Cont on Metformin, Starlix

## 2021-12-29 NOTE — Assessment & Plan Note (Addendum)
Get support athletic briefs Take Torsemide daily x 7-10 days

## 2021-12-29 NOTE — Progress Notes (Signed)
Subjective:  Patient ID: Jon [name], male    DOB: December 10, 1945  Age: 76 y.o. MRN: [id number]  CC: Follow-up   HPI Jon [name] presents for L sciatica, R groin pain, scrotal swelling S/p CABG x3 on Sept 22, 2023  Outpatient Medications Prior to Visit  Medication Sig Dispense Refill   acetaminophen (TYLENOL) 500 MG tablet Take 500-1,000 mg by mouth daily as needed for headache or mild pain.     amiodarone (PACERONE) 200 MG tablet Take '200mg'$  twice per day for 7 days then take '200mg'$  once per day thereafter 60 tablet 1   aspirin EC 81 MG tablet Take 1 tablet (81 mg total) by mouth daily. Swallow whole. 30 tablet 12   Cholecalciferol (VITAMIN D-3 PO) Take 1 capsule by mouth daily.     EPINEPHrine 0.3 mg/0.3 mL IJ SOAJ injection Inject 0.3 mg into the muscle as needed for anaphylaxis. 1 each 0   metFORMIN (GLUCOPHAGE) 1000 MG tablet TAKE 1 TABLET(1000 MG) BY MOUTH TWICE DAILY WITH A MEAL (Patient taking differently: Take 1,000 mg by mouth 2 (two) times daily with a meal.) 180 tablet 2   Multiple Vitamin (MULTIVITAMIN WITH MINERALS) TABS tablet Take 1 tablet by mouth daily.     nateglinide (STARLIX) 120 MG tablet TAKE 1 TABLET(120 MG) BY MOUTH THREE TIMES DAILY WITH MEALS 270 tablet 1   omeprazole (PRILOSEC) 40 MG capsule TAKE 1 CAPSULE BY MOUTH DAILY (Patient taking differently: Take 40 mg by mouth daily.) 90 capsule 3   atorvastatin (LIPITOR) 80 MG tablet Take 1 tablet (80 mg total) by mouth daily. 30 tablet 1   fenofibrate micronized (LOFIBRA) 134 MG capsule TAKE 1 CAPSULE BY MOUTH DAILY BEFORE AND BREAKFAST (Patient taking differently: Take 134 mg by mouth daily before breakfast.) 90 capsule 3   midodrine (PROAMATINE) 5 MG tablet Take 3 tablets (15 mg total) by mouth 3 (three) times daily with meals. 120 tablet 2   torsemide (DEMADEX) 10 MG tablet Take 1-2 tablets (10-20 mg total) by mouth daily as needed. 60 tablet 3   warfarin (COUMADIN) 2.5 MG tablet TAKE 1 TABLET TO 2 TABLETS  BY MOUTH DAILY AS DIRECTED BY COUMADIN CLINIC 40 tablet 2   No facility-administered medications prior to visit.    ROS: Review of Systems  Constitutional:  Positive for fatigue and unexpected weight change. Negative for appetite change.  HENT:  Negative for congestion, nosebleeds, sneezing, sore throat and trouble swallowing.   Eyes:  Negative for itching and visual disturbance.  Respiratory:  Positive for shortness of breath. Negative for cough.   Cardiovascular:  Negative for chest pain, palpitations and leg swelling.  Gastrointestinal:  Negative for abdominal distention, blood in stool, diarrhea, nausea and vomiting.  Genitourinary:  Positive for scrotal swelling and testicular pain. Negative for frequency and hematuria.  Musculoskeletal:  Positive for arthralgias, joint swelling and myalgias. Negative for back pain, gait problem and neck pain.  Skin:  Negative for rash.  Neurological:  Positive for weakness. Negative for dizziness, tremors and speech difficulty.  Psychiatric/Behavioral:  Negative for agitation, dysphoric mood, sleep disturbance and suicidal ideas. The patient is not nervous/anxious.     Objective:  BP 120/72 (BP Location: Left Arm, Patient Position: Sitting, Cuff Size: Normal)   Pulse (!) 59   Temp 98.3 F (36.8 C) (Oral)   Ht '5\' 10"'$  (1.778 m)   Wt 188 lb (85.3 kg)   SpO2 99%   BMI 26.98 kg/m   BP Readings from Last 3  Encounters:  01/12/22 100/62  12/29/21 120/72  12/10/21 94/68    Wt Readings from Last 3 Encounters:  01/12/22 188 lb 6.4 oz (85.5 kg)  12/29/21 188 lb (85.3 kg)  12/10/21 186 lb 3.2 oz (84.5 kg)    Physical Exam Constitutional:      General: He is not in acute distress.    Appearance: He is well-developed. He is ill-appearing.     Comments: NAD  Eyes:     Conjunctiva/sclera: Conjunctivae normal.     Pupils: Pupils are equal, round, and reactive to light.  Neck:     Thyroid: No thyromegaly.     Vascular: No JVD.   Cardiovascular:     Rate and Rhythm: Normal rate and regular rhythm.     Heart sounds: Normal heart sounds. No murmur heard.    No friction rub. No gallop.  Pulmonary:     Effort: Pulmonary effort is normal. No respiratory distress.     Breath sounds: Normal breath sounds. No wheezing or rales.  Chest:     Chest wall: No tenderness.  Abdominal:     General: Bowel sounds are normal. There is no distension.     Palpations: Abdomen is soft. There is no mass.     Tenderness: There is no abdominal tenderness. There is no guarding or rebound.  Musculoskeletal:        General: Tenderness present. Normal range of motion.     Cervical back: Normal range of motion.     Right lower leg: Edema present.     Left lower leg: Edema present.  Lymphadenopathy:     Cervical: No cervical adenopathy.  Skin:    General: Skin is warm and dry.     Findings: No rash.  Neurological:     Mental Status: He is alert and oriented to person, place, and time.     Cranial Nerves: No cranial nerve deficit.     Motor: Weakness present. No abnormal muscle tone.     Coordination: Coordination normal.     Gait: Gait abnormal.     Deep Tendon Reflexes: Reflexes are normal and symmetric.  Psychiatric:        Behavior: Behavior normal.        Thought Content: Thought content normal.        Judgment: Judgment normal.   Swollen scrotum  Lab Results  Component Value Date   WBC 11.2 (H) 11/01/2021   HGB 8.2 (L) 11/01/2021   HCT 25.1 (L) 11/01/2021   PLT 652 (H) 11/01/2021   GLUCOSE 145 (H) 11/02/2021   CHOL 135 10/14/2021   TRIG 164 (H) 10/14/2021   HDL 42 10/14/2021   LDLDIRECT 30.0 11/10/2020   LDLCALC 60 10/14/2021   ALT 29 10/21/2021   AST 30 10/21/2021   NA 138 11/02/2021   K 3.8 11/02/2021   CL 107 11/02/2021   CREATININE 1.28 (H) 11/02/2021   BUN 14 11/02/2021   CO2 23 11/02/2021   TSH 0.466 10/13/2021   PSA 1.38 06/10/2019   INR 1.5 (A) 01/12/2022   HGBA1C 7.1 (H) 09/02/2021    DG Chest 2  View  Result Date: 12/01/2021 CLINICAL DATA:  76 year old male with history of pleural effusion and prior CABG EXAM: CHEST - 2 VIEW COMPARISON:  11/08/2021 FINDINGS: Cardiomediastinal silhouette unchanged in size and contour. Surgical changes of median sternotomy, CABG, mitral valve annuloplasty. Similar appearance of asymmetric elevation the left hemidiaphragm. No meniscus in the sulcus on the lateral view. Linear opacities persist at  the lung bases. No pneumothorax. No confluent airspace disease. Degenerative changes spine with no displaced fracture. IMPRESSION: Interval resolution of pleural fluid, with basilar scarring/atelectasis. Electronically Signed   By: Corrie Mckusick D.O.   On: 12/01/2021 10:07    Assessment & Plan:   Problem List Items Addressed This Visit     Weakness    D/c fenofibrate      S/P CABG x 3    Recovering - slow progress Treat edema      Myalgia    Stop fenofibrate      Leg edema, right    Take Torsemide daily x 7-10 days      Inguinal hernia    R groin small hernia, NT Get support athletic briefs      Edema of scrotum    Get support athletic briefs Take Torsemide daily x 7-10 days      Edema    Take Torsemide daily x 7-10 days      Dyslipidemia    Cont on Metformin, Starlix Cont on Lipitor, fenofibrate      Diabetes mellitus, type 2 (HCC)    Cont on Metformin, Starlix      Coronary artery disease - Primary    S/p CABG x3 on Sept 22, 2023         No orders of the defined types were placed in this encounter.     Follow-up: Return in about 2 weeks (around 01/12/2022) for a follow-up visit.  Walker Kehr, MD

## 2021-12-31 ENCOUNTER — Telehealth: Payer: Self-pay | Admitting: Internal Medicine

## 2021-12-31 NOTE — Telephone Encounter (Signed)
Okay. Thank you.

## 2021-12-31 NOTE — Telephone Encounter (Signed)
[name] from [facility] called for Verbal orders to continue nursing for 1X a week for 4 weeks.   Please call [name] to confirm: [phone]

## 2021-12-31 NOTE — Telephone Encounter (Signed)
Notified Jon [name] ok for verbal. Pt is in compliance w/ appt.Jon KitchenJohny Chess

## 2022-01-02 DIAGNOSIS — G709 Myoneural disorder, unspecified: Secondary | ICD-10-CM | POA: Diagnosis not present

## 2022-01-02 DIAGNOSIS — I4892 Unspecified atrial flutter: Secondary | ICD-10-CM | POA: Diagnosis not present

## 2022-01-02 DIAGNOSIS — K5790 Diverticulosis of intestine, part unspecified, without perforation or abscess without bleeding: Secondary | ICD-10-CM | POA: Diagnosis not present

## 2022-01-02 DIAGNOSIS — G5701 Lesion of sciatic nerve, right lower limb: Secondary | ICD-10-CM | POA: Diagnosis not present

## 2022-01-02 DIAGNOSIS — R001 Bradycardia, unspecified: Secondary | ICD-10-CM | POA: Diagnosis not present

## 2022-01-02 DIAGNOSIS — E119 Type 2 diabetes mellitus without complications: Secondary | ICD-10-CM | POA: Diagnosis not present

## 2022-01-02 DIAGNOSIS — I081 Rheumatic disorders of both mitral and tricuspid valves: Secondary | ICD-10-CM | POA: Diagnosis not present

## 2022-01-02 DIAGNOSIS — I480 Paroxysmal atrial fibrillation: Secondary | ICD-10-CM | POA: Diagnosis not present

## 2022-01-02 DIAGNOSIS — Z48812 Encounter for surgical aftercare following surgery on the circulatory system: Secondary | ICD-10-CM | POA: Diagnosis not present

## 2022-01-02 DIAGNOSIS — I252 Old myocardial infarction: Secondary | ICD-10-CM | POA: Diagnosis not present

## 2022-01-02 DIAGNOSIS — G4733 Obstructive sleep apnea (adult) (pediatric): Secondary | ICD-10-CM | POA: Diagnosis not present

## 2022-01-02 DIAGNOSIS — I25110 Atherosclerotic heart disease of native coronary artery with unstable angina pectoris: Secondary | ICD-10-CM | POA: Diagnosis not present

## 2022-01-02 DIAGNOSIS — I119 Hypertensive heart disease without heart failure: Secondary | ICD-10-CM | POA: Diagnosis not present

## 2022-01-02 DIAGNOSIS — D62 Acute posthemorrhagic anemia: Secondary | ICD-10-CM | POA: Diagnosis not present

## 2022-01-02 DIAGNOSIS — R55 Syncope and collapse: Secondary | ICD-10-CM | POA: Diagnosis not present

## 2022-01-02 DIAGNOSIS — N179 Acute kidney failure, unspecified: Secondary | ICD-10-CM | POA: Diagnosis not present

## 2022-01-05 ENCOUNTER — Ambulatory Visit (INDEPENDENT_AMBULATORY_CARE_PROVIDER_SITE_OTHER): Payer: Medicare Other

## 2022-01-05 DIAGNOSIS — I4891 Unspecified atrial fibrillation: Secondary | ICD-10-CM

## 2022-01-05 DIAGNOSIS — I059 Rheumatic mitral valve disease, unspecified: Secondary | ICD-10-CM

## 2022-01-05 DIAGNOSIS — Z7901 Long term (current) use of anticoagulants: Secondary | ICD-10-CM | POA: Diagnosis not present

## 2022-01-05 LAB — POCT INR: INR: 1.6 — AB (ref 2.0–3.0)

## 2022-01-05 NOTE — Patient Instructions (Signed)
Description   Spoke with [name], [place] and advised to have pt take 1.5 tablets today and 2.5 tablets tomorrow and then resume taking 2 tablets daily except 1 tablet on Mondays, Wednesdays, and Fridays.  Stay consistent with greens each week.  Recheck INR in 1 week by [place] '200mg'$  BID X 7 days, then '200mg'$  daily*

## 2022-01-12 ENCOUNTER — Ambulatory Visit (INDEPENDENT_AMBULATORY_CARE_PROVIDER_SITE_OTHER): Payer: Medicare Other | Admitting: *Deleted

## 2022-01-12 ENCOUNTER — Ambulatory Visit (INDEPENDENT_AMBULATORY_CARE_PROVIDER_SITE_OTHER): Payer: Medicare Other | Admitting: Internal Medicine

## 2022-01-12 ENCOUNTER — Encounter: Payer: Self-pay | Admitting: Internal Medicine

## 2022-01-12 VITALS — BP 100/62 | HR 65 | Temp 98.4°F | Ht 70.0 in | Wt 188.4 lb

## 2022-01-12 DIAGNOSIS — N179 Acute kidney failure, unspecified: Secondary | ICD-10-CM | POA: Diagnosis not present

## 2022-01-12 DIAGNOSIS — N5089 Other specified disorders of the male genital organs: Secondary | ICD-10-CM | POA: Diagnosis not present

## 2022-01-12 DIAGNOSIS — I4891 Unspecified atrial fibrillation: Secondary | ICD-10-CM | POA: Diagnosis not present

## 2022-01-12 DIAGNOSIS — I059 Rheumatic mitral valve disease, unspecified: Secondary | ICD-10-CM

## 2022-01-12 DIAGNOSIS — Z7901 Long term (current) use of anticoagulants: Secondary | ICD-10-CM

## 2022-01-12 DIAGNOSIS — R531 Weakness: Secondary | ICD-10-CM

## 2022-01-12 DIAGNOSIS — I480 Paroxysmal atrial fibrillation: Secondary | ICD-10-CM

## 2022-01-12 DIAGNOSIS — E1159 Type 2 diabetes mellitus with other circulatory complications: Secondary | ICD-10-CM

## 2022-01-12 LAB — POCT INR: INR: 1.5 — AB (ref 2.0–3.0)

## 2022-01-12 MED ORDER — WARFARIN SODIUM 2.5 MG PO TABS: ORAL_TABLET | ORAL | 1 refills | Status: DC

## 2022-01-12 MED ORDER — ATORVASTATIN CALCIUM 80 MG PO TABS: 80.0000 mg | ORAL_TABLET | Freq: Every day | ORAL | 3 refills | Status: DC

## 2022-01-12 MED ORDER — TORSEMIDE 10 MG PO TABS: 10.0000 mg | ORAL_TABLET | Freq: Every day | ORAL | 3 refills | Status: DC

## 2022-01-12 MED ORDER — SPIRONOLACTONE 25 MG PO TABS: 25.0000 mg | ORAL_TABLET | Freq: Every day | ORAL | 3 refills | Status: DC

## 2022-01-12 NOTE — Progress Notes (Signed)
Subjective:  Patient ID: Jon [name], male    DOB: 1945/03/07  Age: 76 y.o. MRN: [id number]  CC: Follow-up (2 week f/u)   HPI Jon [name] presents for scrotal edema, weakness Weakness - better off fenofibrate C/o leg swelling L>R; swollen scrotum - L testis w/pain  Outpatient Medications Prior to Visit  Medication Sig Dispense Refill   acetaminophen (TYLENOL) 500 MG tablet Take 500-1,000 mg by mouth daily as needed for headache or mild pain.     amiodarone (PACERONE) 200 MG tablet Take '200mg'$  twice per day for 7 days then take '200mg'$  once per day thereafter 60 tablet 1   aspirin EC 81 MG tablet Take 1 tablet (81 mg total) by mouth daily. Swallow whole. 30 tablet 12   Cholecalciferol (VITAMIN D-3 PO) Take 1 capsule by mouth daily.     EPINEPHrine 0.3 mg/0.3 mL IJ SOAJ injection Inject 0.3 mg into the muscle as needed for anaphylaxis. 1 each 0   metFORMIN (GLUCOPHAGE) 1000 MG tablet TAKE 1 TABLET(1000 MG) BY MOUTH TWICE DAILY WITH A MEAL (Patient taking differently: Take 1,000 mg by mouth 2 (two) times daily with a meal.) 180 tablet 2   Multiple Vitamin (MULTIVITAMIN WITH MINERALS) TABS tablet Take 1 tablet by mouth daily.     nateglinide (STARLIX) 120 MG tablet TAKE 1 TABLET(120 MG) BY MOUTH THREE TIMES DAILY WITH MEALS 270 tablet 1   omeprazole (PRILOSEC) 40 MG capsule TAKE 1 CAPSULE BY MOUTH DAILY (Patient taking differently: Take 40 mg by mouth daily.) 90 capsule 3   warfarin (COUMADIN) 2.5 MG tablet TAKE  1.5 TABLETS TO 2 TABLETS BY MOUTH DAILY OR AS DIRECTED BY COUMADIN CLINIC. 50 tablet 1   atorvastatin (LIPITOR) 80 MG tablet Take 1 tablet (80 mg total) by mouth daily. 30 tablet 1   fenofibrate micronized (LOFIBRA) 134 MG capsule TAKE 1 CAPSULE BY MOUTH DAILY BEFORE AND BREAKFAST (Patient taking differently: Take 134 mg by mouth daily before breakfast.) 90 capsule 3   midodrine (PROAMATINE) 5 MG tablet Take 3 tablets (15 mg total) by mouth 3 (three) times daily with meals.  120 tablet 2   torsemide (DEMADEX) 10 MG tablet Take 1-2 tablets (10-20 mg total) by mouth daily as needed. 60 tablet 3   No facility-administered medications prior to visit.    ROS: Review of Systems  Constitutional:  Negative for appetite change, fatigue and unexpected weight change.  HENT:  Negative for congestion, nosebleeds, sneezing, sore throat and trouble swallowing.   Eyes:  Negative for itching and visual disturbance.  Respiratory:  Negative for cough.   Cardiovascular:  Negative for chest pain, palpitations and leg swelling.  Gastrointestinal:  Negative for abdominal distention, blood in stool, diarrhea and nausea.  Genitourinary:  Positive for scrotal swelling. Negative for frequency, genital sores, hematuria and penile swelling.  Musculoskeletal:  Negative for back pain, gait problem, joint swelling and neck pain.  Skin:  Negative for color change and rash.  Neurological:  Negative for dizziness, tremors, speech difficulty and weakness.  Psychiatric/Behavioral:  Negative for agitation, dysphoric mood and sleep disturbance. The patient is not nervous/anxious.     Objective:  BP 100/62 (BP Location: Left Arm)   Pulse 65   Temp 98.4 F (36.9 C) (Oral)   Ht '5\' 10"'$  (1.778 m)   Wt 188 lb 6.4 oz (85.5 kg)   SpO2 96%   BMI 27.03 kg/m   BP Readings from Last 3 Encounters:  01/12/22 100/62  12/29/21 120/72  12/10/21  94/68    Wt Readings from Last 3 Encounters:  01/12/22 188 lb 6.4 oz (85.5 kg)  12/29/21 188 lb (85.3 kg)  12/10/21 186 lb 3.2 oz (84.5 kg)    Physical Exam Constitutional:      General: He is not in acute distress.    Appearance: Normal appearance. He is well-developed.     Comments: NAD  Eyes:     Conjunctiva/sclera: Conjunctivae normal.     Pupils: Pupils are equal, round, and reactive to light.  Neck:     Thyroid: No thyromegaly.     Vascular: No JVD.  Cardiovascular:     Rate and Rhythm: Normal rate and regular rhythm.     Heart sounds:  Normal heart sounds. No murmur heard.    No friction rub. No gallop.  Pulmonary:     Effort: Pulmonary effort is normal. No respiratory distress.     Breath sounds: Normal breath sounds. No wheezing or rales.  Chest:     Chest wall: No tenderness.  Abdominal:     General: Bowel sounds are normal. There is no distension.     Palpations: Abdomen is soft. There is no mass.     Tenderness: There is no abdominal tenderness. There is no guarding or rebound.  Genitourinary:    Penis: Normal.   Musculoskeletal:        General: No tenderness. Normal range of motion.     Cervical back: Normal range of motion.     Right lower leg: Edema present.     Left lower leg: Edema present.  Lymphadenopathy:     Cervical: No cervical adenopathy.  Skin:    General: Skin is warm and dry.     Findings: No rash.  Neurological:     Mental Status: He is alert and oriented to person, place, and time.     Cranial Nerves: No cranial nerve deficit.     Motor: No abnormal muscle tone.     Coordination: Coordination normal.     Gait: Gait normal.     Deep Tendon Reflexes: Reflexes are normal and symmetric.  Psychiatric:        Behavior: Behavior normal.        Thought Content: Thought content normal.        Judgment: Judgment normal.    Scrotum w/less swelling R testis appendages - sensitive Lab Results  Component Value Date   WBC 11.2 (H) 11/01/2021   HGB 8.2 (L) 11/01/2021   HCT 25.1 (L) 11/01/2021   PLT 652 (H) 11/01/2021   GLUCOSE 145 (H) 11/02/2021   CHOL 135 10/14/2021   TRIG 164 (H) 10/14/2021   HDL 42 10/14/2021   LDLDIRECT 30.0 11/10/2020   LDLCALC 60 10/14/2021   ALT 29 10/21/2021   AST 30 10/21/2021   NA 138 11/02/2021   K 3.8 11/02/2021   CL 107 11/02/2021   CREATININE 1.28 (H) 11/02/2021   BUN 14 11/02/2021   CO2 23 11/02/2021   TSH 0.466 10/13/2021   PSA 1.38 06/10/2019   INR 1.5 (A) 01/12/2022   HGBA1C 7.1 (H) 09/02/2021    DG Chest 2 View  Result Date:  12/01/2021 CLINICAL DATA:  76 year old male with history of pleural effusion and prior CABG EXAM: CHEST - 2 VIEW COMPARISON:  11/08/2021 FINDINGS: Cardiomediastinal silhouette unchanged in size and contour. Surgical changes of median sternotomy, CABG, mitral valve annuloplasty. Similar appearance of asymmetric elevation the left hemidiaphragm. No meniscus in the sulcus on the lateral view. Linear opacities  persist at the lung bases. No pneumothorax. No confluent airspace disease. Degenerative changes spine with no displaced fracture. IMPRESSION: Interval resolution of pleural fluid, with basilar scarring/atelectasis. Electronically Signed   By: Corrie Mckusick D.O.   On: 12/01/2021 10:07    Assessment & Plan:   Problem List Items Addressed This Visit     Weakness    Resolved off fenofibrate      Edema of scrotum    Better. Scrotal US ordered due to pain. Will use 10 mg Torsemide and 12.5 mg Spironolactone Hydrate well Monitor GFR       Relevant Orders   US SCROTUM   Diabetes mellitus, type 2 ([place]) - Primary    On Starlix      Relevant Medications   atorvastatin (LIPITOR) 80 MG tablet   Other Relevant Orders   Comprehensive metabolic panel   Hemoglobin A1c   AKI (acute kidney injury) ([place])    Hydrate well Monitor GFR Using diuretics w/caution          Meds ordered this encounter  Medications   atorvastatin (LIPITOR) 80 MG tablet    Sig: Take 1 tablet (80 mg total) by mouth daily.    Dispense:  90 tablet    Refill:  3   spironolactone (ALDACTONE) 25 MG tablet    Sig: Take 1 tablet (25 mg total) by mouth daily.    Dispense:  30 tablet    Refill:  3   torsemide (DEMADEX) 10 MG tablet    Sig: Take 1 tablet (10 mg total) by mouth daily.    Dispense:  30 tablet    Refill:  3      Follow-up: Return in about 3 weeks (around 02/02/2022) for a follow-up visit.  Walker Kehr, MD

## 2022-01-12 NOTE — Assessment & Plan Note (Addendum)
Better. Scrotal US ordered due to pain. Will use 10 mg Torsemide and 12.5 mg Spironolactone Hydrate well Monitor GFR

## 2022-01-12 NOTE — Patient Instructions (Signed)
Description   Spoke with [name], [place] and advised to have pt take 2 tablets today and 2.5 tablets tomorrow and then start taking 2 tablets daily except 1 tablet on  Wednesdays and Fridays. Stay consistent with greens each week.  Recheck INR in 1 week by [facility]  Coumadin Clinic [phone] * Amio '200mg'$  BID X 7 days, then '200mg'$  daily*

## 2022-01-12 NOTE — Assessment & Plan Note (Signed)
Recovering - slow progress Treat edema

## 2022-01-12 NOTE — Assessment & Plan Note (Signed)
Resolved off fenofibrate

## 2022-01-12 NOTE — Patient Instructions (Signed)
Take 10 mg Torsemide and 12.5 mg Spironolactone every morning Hydrate well Taper off Midodrin

## 2022-01-12 NOTE — Assessment & Plan Note (Signed)
On Starlix

## 2022-01-12 NOTE — Assessment & Plan Note (Signed)
Hydrate well Monitor GFR Using diuretics w/caution

## 2022-01-12 NOTE — Assessment & Plan Note (Signed)
D/c fenofibrate

## 2022-01-17 ENCOUNTER — Telehealth (HOSPITAL_COMMUNITY): Payer: Self-pay

## 2022-01-17 NOTE — Telephone Encounter (Signed)
Called patient to see if he was interested in participating in the Cardiac Rehab Program. Patient stated yes. Patient will come in for orientation on 01/20/2022'@10'$ :15am and will attend the 10:15am exercise class.

## 2022-01-19 ENCOUNTER — Telehealth (HOSPITAL_COMMUNITY): Payer: Self-pay

## 2022-01-19 ENCOUNTER — Telehealth: Payer: Self-pay | Admitting: Internal Medicine

## 2022-01-19 ENCOUNTER — Ambulatory Visit (INDEPENDENT_AMBULATORY_CARE_PROVIDER_SITE_OTHER): Payer: Medicare Other

## 2022-01-19 DIAGNOSIS — I059 Rheumatic mitral valve disease, unspecified: Secondary | ICD-10-CM

## 2022-01-19 DIAGNOSIS — I4891 Unspecified atrial fibrillation: Secondary | ICD-10-CM

## 2022-01-19 DIAGNOSIS — R600 Localized edema: Secondary | ICD-10-CM

## 2022-01-19 DIAGNOSIS — N5089 Other specified disorders of the male genital organs: Secondary | ICD-10-CM

## 2022-01-19 DIAGNOSIS — Z7901 Long term (current) use of anticoagulants: Secondary | ICD-10-CM | POA: Diagnosis not present

## 2022-01-19 LAB — POCT INR: INR: 1.5 — AB (ref 2.0–3.0)

## 2022-01-19 NOTE — Telephone Encounter (Signed)
[name] from [facility] called for clarification on the pt's midodrine (PROAMATINE) 5 MG tablet RX.  [name] is aware the medication has been discontinued but wants to know what dosage the pt should be taking until he is out of the medication.   Please call [name]: [phone]

## 2022-01-19 NOTE — Telephone Encounter (Signed)
Called [name] no answer LMOM RTC.Marland KitchenJohny [name]

## 2022-01-19 NOTE — Patient Instructions (Signed)
Description   Spoke with [name], [place] and advised to have pt take 2 tablets today and 2.5 tablets tomorrow and then start taking 2 tablets daily. Stay consistent with greens each week. Recheck INR in 1 week by [facility]  Coumadin Clinic [phone] * Amio '200mg'$  BID X 7 days, then '200mg'$  daily*

## 2022-01-20 ENCOUNTER — Telehealth: Payer: Self-pay | Admitting: Internal Medicine

## 2022-01-20 ENCOUNTER — Other Ambulatory Visit: Payer: Self-pay | Admitting: Internal Medicine

## 2022-01-20 ENCOUNTER — Ambulatory Visit
Admission: RE | Admit: 2022-01-20 | Discharge: 2022-01-20 | Disposition: A | Discharge status: Home / Self Care | Payer: Medicare Other | Source: Ambulatory Visit | Attending: Internal Medicine | Admitting: Internal Medicine

## 2022-01-20 ENCOUNTER — Encounter (HOSPITAL_COMMUNITY)
Admission: RE | Admit: 2022-01-20 | Discharge: 2022-01-20 | Disposition: A | Discharge status: Home / Self Care | Payer: Medicare Other | Source: Ambulatory Visit | Attending: Cardiovascular Disease | Admitting: Cardiovascular Disease

## 2022-01-20 VITALS — BP 98/60 | HR 56 | Ht 69.5 in | Wt 190.0 lb

## 2022-01-20 DIAGNOSIS — Z9889 Other specified postprocedural states: Secondary | ICD-10-CM | POA: Insufficient documentation

## 2022-01-20 DIAGNOSIS — N5089 Other specified disorders of the male genital organs: Secondary | ICD-10-CM

## 2022-01-20 DIAGNOSIS — N433 Hydrocele, unspecified: Secondary | ICD-10-CM | POA: Diagnosis not present

## 2022-01-20 DIAGNOSIS — Z951 Presence of aortocoronary bypass graft: Secondary | ICD-10-CM | POA: Insufficient documentation

## 2022-01-20 DIAGNOSIS — I861 Scrotal varices: Secondary | ICD-10-CM | POA: Diagnosis not present

## 2022-01-20 DIAGNOSIS — N503 Cyst of epididymis: Secondary | ICD-10-CM | POA: Diagnosis not present

## 2022-01-20 LAB — GLUCOSE, CAPILLARY
Glucose-Capillary: 61 mg/dL — ABNORMAL LOW (ref 70–99)
Glucose-Capillary: 64 mg/dL — ABNORMAL LOW (ref 70–99)
Glucose-Capillary: 98 mg/dL (ref 70–99)

## 2022-01-20 MED ORDER — NATEGLINIDE 120 MG PO TABS: 60.0000 mg | ORAL_TABLET | Freq: Three times a day (TID) | ORAL | 1 refills | Status: DC

## 2022-01-20 NOTE — Assessment & Plan Note (Signed)
We are tapering off midodrine due to scrotal and leg edema; he is not dizzy or lightheaded.  Will watch.  May restart midodrine later.

## 2022-01-20 NOTE — Progress Notes (Signed)
Good morning Dr Doristine Devoid. I have Mr Moch here with me for cardiac rehab orientation he is Dr Camillia Herter Mr Kropf's CBG was 64 this morning.. Patient asymptomatic. Patient ate cereal and a banana for breakfast this morning. Patient was given a ginger ale and graham crackers. Repeat CBG 61. Fingers were cold. Warm water was placed on both hands. 3rd CBG check was 98. He is taking Starlix 3 times a day and metformin 1000 mg twice a day. Mr Delker says when he checks his blood sugars in the morning they are less than 100. Would you please consider decreasing his diabetic medications. I could not see what his last HGB AIC is. I appreciate your help in advance. Message to Dr Alain Marion.[name] RN BSN  Makaiyah Schweiger, pls tell him to take Starlix 1/2 tab w/meals. Thank you, AP Response from Dr Alain Marion.[name] RN BSN

## 2022-01-20 NOTE — Progress Notes (Signed)
Cardiac Rehab Medication Review by a Nurse  Does the patient  feel that his/her medications are working for him/her?  yes  Has the patient been experiencing any side effects to the medications prescribed?  yes  Does the patient measure his/her own blood pressure or blood glucose at home?  yes   Does the patient have any problems obtaining medications due to transportation or finances?   no  Understanding of regimen: good Understanding of indications: good Potential of compliance: good    Nurse comments: Kalvyn is taking his medications as prescribed and has a good understanding of what his medications are for. Cornie is on Midodrine for low blood pressure. Orthostatic BP's were checked today. Herschell checks his CBG's once a day. Menachem has a BP cuff he does not check his blood pressure on a daily basis.    Christa See [facility] RN 01/20/2022 10:24 AM

## 2022-01-20 NOTE — Telephone Encounter (Signed)
A nurse called said patient was at their office for cardiac rehab. She stated the patient's blood sugar was low, it was 64. She said we could call her back as the patient was drinking a ginger ale and eating some crackers and she would recheck it. The best number to reach her is [phone].

## 2022-01-20 NOTE — Telephone Encounter (Signed)
[name] called back she states they figured it out yesterday the pt had not been taking the med three times a day, but they are curious why MD is tapering him off the Midodrine. Pt BP has been running low.Marland KitchenJohny Chess

## 2022-01-20 NOTE — Telephone Encounter (Signed)
We are tapering off midodrine due to scrotal and leg edema; he is not dizzy or lightheaded.  Will watch.  May restart midodrine later.

## 2022-01-20 NOTE — Telephone Encounter (Signed)
Spoke with Verdis Frederickson again regarding this matter. Stated they were able to get PT's blood sugar up after having some ginger ale but they are concerned with it dipping back again later on. PT stated they had consistently been getting sub 100 readings on their blood sugar for a while and PT has no recent A1C done on our end.  Verdis Frederickson wants to speak with Dr.Plotnikov to assess current blood sugar medications for PT.

## 2022-01-20 NOTE — Progress Notes (Addendum)
Incomplete Session Note  Patient Details  Name: Jon [name] MRN: [id number] Date of Birth: [birth year] Referring Provider:    [name] did not complete his rehab session.  CBG 64. Patient asymptomatic. Patient ate cereal and a banana for breakfast this morning. Patient was given a ginger ale and graham crackers. Repeat CBG 61. Fingers were cold. Warm water was placed on both hands. Third  CBG check was 98. Patient was counseled by the dietitian today.  Dr Alain Marion notified about the patient's CBG readings today. Will not be able to proceed with cardiac rehab orientation due to low CBG. Patient has been rescheduled to return to cardiac rehab orientation on Tuesday at 1:15 pm. Dr Alain Marion wants the patient to decrease his Starlix to a 1/2 tablet 3 times day. Patient notified and is aware of the medication change.[name] RN BSN

## 2022-01-25 ENCOUNTER — Encounter (HOSPITAL_COMMUNITY)
Admission: RE | Admit: 2022-01-25 | Discharge: 2022-01-25 | Disposition: A | Discharge status: Home / Self Care | Payer: Medicare Other | Source: Ambulatory Visit | Attending: Cardiovascular Disease | Admitting: Cardiovascular Disease

## 2022-01-25 VITALS — BP 99/66 | HR 57 | Ht 69.5 in | Wt 190.5 lb

## 2022-01-25 DIAGNOSIS — Z951 Presence of aortocoronary bypass graft: Secondary | ICD-10-CM

## 2022-01-25 DIAGNOSIS — Z9889 Other specified postprocedural states: Secondary | ICD-10-CM

## 2022-01-25 LAB — GLUCOSE, CAPILLARY
Glucose-Capillary: 97 mg/dL (ref 70–99)
Glucose-Capillary: 105 mg/dL — ABNORMAL HIGH (ref 70–99)
Glucose-Capillary: 104 mg/dL — ABNORMAL HIGH (ref 70–99)

## 2022-01-25 NOTE — Progress Notes (Signed)
[place] returns today to complete his cardiac rehab orientation-  6 minute walk test.  Pt CBG checked 97. Arye was quite surprised as he had a prime rib sandwich and coconut cake prior to his arrival for this appt.  Hakan had cereal and fruit for breakfast.  Although he is to take his Starlix with each meal he had it this morning for breakfast but did not take it with his lunch in hopes his CBG would be "good" enough to complete his appt.   Keny reports routinely home fasting CBG run in the 90's.  Pt given 30g carbohydrate snack along with 2 small oranges.  Rechecked in 15 minutes 105.  Proceeded with 6 minute walk test and rechecked post CBG 104.  Math is to return tomorrow for exercise, advised to hold his starlix and metformin prior to his appt.  Pt to eat a balanced meal with protein and carbohydrates.  Examples given.   Pt verbalized understanding.  Will route this note to Dr. Alain Marion. Cherre Huger, BSN Cardiac and Training and development officer

## 2022-01-25 NOTE — Progress Notes (Signed)
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 Swallow whole., Disp: 30 tablet, Rfl: 12   atorvastatin (LIPITOR) 80 MG tablet, Take 1 tablet (80 mg total) by mouth daily. (Patient taking differently: Take 40 mg by mouth daily.), Disp: 90 tablet, Rfl: 3   Cholecalciferol (VITAMIN D-3 PO), Take 1 capsule by mouth daily., Disp: , Rfl:    EPINEPHrine 0.3 mg/0.3 mL IJ SOAJ injection, Inject 0.3 mg into the muscle as needed for anaphylaxis., Disp: 1 each, Rfl: 0   metFORMIN (GLUCOPHAGE) 1000 MG tablet, TAKE 1 TABLET(1000 MG) BY MOUTH TWICE DAILY WITH A MEAL (Patient taking differently: Take 1,000 mg by mouth 2 (two) times daily with a meal.), Disp: 180 tablet, Rfl: 2   midodrine (PROAMATINE) 5 MG tablet, Take 5 mg by mouth 2 (two) times daily with a meal. PT IS BEING TAPERED OFF OF MEDICATION, Disp: , Rfl:    Multiple Vitamin (MULTIVITAMIN WITH MINERALS) TABS tablet, Take 1  tablet by mouth 3 (three) times a week., Disp: , Rfl:    Multiple Vitamins-Minerals (MULTIVITAMIN ADULTS 50+ PO), Take 1 tablet by mouth daily., Disp: , Rfl:    nateglinide (STARLIX) 120 MG tablet, Take 0.5 tablets (60 mg total) by mouth 3 (three) times daily with meals., Disp: 270 tablet, Rfl: 1   omeprazole (PRILOSEC) 40 MG capsule, TAKE 1 CAPSULE BY MOUTH DAILY (Patient taking differently: Take 40 mg by mouth daily.), Disp: 90 capsule, Rfl: 3   spironolactone (ALDACTONE) 25 MG tablet, Take 1 tablet (25 mg total) by mouth daily. (Patient taking differently: Take 12.5 mg by mouth daily.), Disp: 30 tablet, Rfl: 3   torsemide (DEMADEX) 10 MG tablet, Take 1 tablet (10 mg total) by mouth daily., Disp: 30 tablet, Rfl: 3   warfarin (COUMADIN) 2.5 MG tablet, TAKE  1.5 TABLETS TO 2 TABLETS BY MOUTH DAILY OR AS DIRECTED BY COUMADIN CLINIC. (Patient taking differently: Take 5 mg by mouth daily. TAKE  1.5 TABLETS TO 2 TABLETS BY MOUTH DAILY OR AS DIRECTED BY COUMADIN CLINIC), Disp: 50 tablet, Rfl: 1  Past Medical History: Past Medical History:  Diagnosis Date   Acute intractable headache 09/02/2021   Adenomatous colon polyp    Anemia    Atrial fibrillation ([place])    Cataract    Diabetes mellitus without complication (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 pylori infection 2011   Heart murmur    History of radiation therapy 09/20/10 thru 10/29/10   gastroesophageal junction/gastric cardia   Hypertension    Neuromuscular disorder ([place])    Other and unspecified hyperlipidemia    Sleep apnea    On CPAP   Status post chemotherapy 09/22/10 thru 10/27/10   concurrent w/radiation   Unspecified sleep apnea     Tobacco Use: Social History   Tobacco Use  Smoking Status Former   Packs/day: 1.00   Years: 40.00   Total pack years: 40.00   Types: Cigarettes   Quit date: 01/31/2005    Years since quitting: 16.9  Smokeless Tobacco Never    Labs: Review Flowsheet  More data exists      Latest Ref Rng & Units 09/02/2021 10/13/2021 10/14/2021 10/21/2021 10/22/2021  Labs for ITP Cardiac and Pulmonary Rehab  Cholestrol 0 - 200 mg/dL - - 135  - -  LDL (calc) 0 - 99 mg/dL - - 60  - -  HDL-C >40 mg/dL - - 42  - -  Trlycerides <150 mg/dL - - 164  - -  Hemoglobin A1c 4.6 - 6.5 % 7.1  - - - -  PH, Arterial 7.35 - 7.45 - 7.276  7.336  - 7.45  7.293  7.310  7.316  7.356  7.497  7.449  7.335   PCO2 arterial 32 - 48 mmHg - 48.3  39.4  - 35  38.4  38.7  36.0  37.8  33.9  37.7  52.6   Bicarbonate 20.0 - 28.0 mmol/L - 19.2  22.5  21.1  - 24.3  18.4  19.4  18.4  21.4  26.2  26.1  25.3  28.0   TCO2 22 - 32 mmol/L - '20  24  22  21  '$ - - '20  21  19  23  25  27  26  27  26  27  30  25  29   '$ Acid-base deficit 0.0 - 2.0 mmol/L - 6.0  5.0  4.0  - - 7.0  6.0  7.0  4.0  1.0   O2 Saturation % - 96  76  100  - 99.2  97  98  98  99  100  100  89  100     Capillary Blood Glucose: Lab Results  Component Value Date   GLUCAP 104 (H) 01/25/2022   GLUCAP 105 (H) 01/25/2022   GLUCAP 97 01/25/2022   GLUCAP 98 01/20/2022   GLUCAP 61 (L) 01/20/2022     Exercise Target Goals: Exercise Program Goal: Individual exercise prescription set using results from initial 6 min walk test and THRR while considering  patient's activity barriers and safety.   Exercise Prescription Goal: Initial exercise prescription builds to 30-45 minutes a day of aerobic activity, 2-3 days per week.  Home exercise guidelines will be given to patient during program as part of exercise prescription that the participant will acknowledge.  Activity Barriers & Risk Stratification:  Activity Barriers & Cardiac Risk Stratification - 01/25/22 1528       Activity Barriers & Cardiac Risk Stratification   Activity Barriers Deconditioning;Back Problems;Balance Concerns;Muscular Weakness    Cardiac Risk Stratification High              6 Minute Walk:  6 Minute Walk     Row Name 01/25/22 1419         6 Minute  Walk   Phase Initial     Distance 1346 feet     Walk Time 6 minutes     # of Rest Breaks 0     MPH 2.55     METS 2.6     RPE 11     Perceived Dyspnea  0     VO2 Peak 9.01     Symptoms No     Resting HR 57 bpm     Resting BP 99/66     Resting Oxygen Saturation  98 %     Exercise Oxygen Saturation  during 6 min walk 100 %     Max Ex. HR 85 bpm     Max Ex. BP 137/90     2 Minute Post BP 123/84              Oxygen Initial Assessment:   Oxygen Re-Evaluation:   Oxygen Discharge (Final Oxygen Re-Evaluation):   Initial Exercise Prescription:  Initial Exercise Prescription - 01/25/22 1500       Date of Initial Exercise RX and Referring Provider   Date 01/25/22    Referring Provider [name], MD    Expected Discharge Date 03/18/22      Recumbant Bike   Level 2    RPM 60    Watts 15    Minutes 15    METs 2.6      Arm Ergometer   Level 2    Watts 37    RPM 60    Minutes 15    METs 2.6      Prescription Details   Frequency (times per week) 3    Duration Progress to 30 minutes of continuous aerobic without signs/symptoms of physical distress      Intensity   THRR 40-80% of Max Heartrate 58-115    Ratings of Perceived Exertion 11-13    Perceived Dyspnea 0-4      Progression   Progression Continue progressive overload as per policy without signs/symptoms or physical distress.      Resistance Training   Training Prescription Yes    Weight 3 lbs    Reps 10-15             Perform Capillary Blood Glucose checks as needed.  Exercise Prescription Changes:   Exercise Comments:   Exercise Goals and Review:   Exercise Goals     Row Name 01/25/22 1530             Exercise Goals   Increase Physical Activity Yes       Intervention Provide advice, education, support and counseling about physical activity/exercise needs.;Develop an individualized  exercise prescription for aerobic and resistive training based on initial evaluation findings, risk stratification, comorbidities and participant's personal goals.       Expected Outcomes Short Term: Attend rehab on a regular basis to increase amount of physical activity.;Long Term: Add in home exercise to make exercise part of routine and to increase amount of physical activity.;Long Term: Exercising regularly at least 3-5 days a week.       Increase Strength and Stamina Yes       Intervention Provide advice, education, support and counseling about physical activity/exercise needs.;Develop an individualized exercise prescription for aerobic and resistive training based on initial evaluation findings, risk stratification, comorbidities and participant's personal goals.       Expected Outcomes Short Term: Increase workloads from initial exercise prescription for resistance, speed, and METs.;Short Term: Perform resistance training exercises routinely during rehab and  add in resistance training at home;Long Term: Improve cardiorespiratory fitness, muscular endurance and strength as measured by increased METs and functional capacity (6MWT)       Able to understand and use rate of perceived exertion (RPE) scale Yes       Intervention Provide education and explanation on how to use RPE scale       Expected Outcomes Short Term: Able to use RPE daily in rehab to express subjective intensity level;Long Term:  Able to use RPE to guide intensity level when exercising independently       Knowledge and understanding of Target Heart Rate Range (THRR) Yes       Intervention Provide education and explanation of THRR including how the numbers were predicted and where they are located for reference       Expected Outcomes Long Term: Able to use THRR to govern intensity when exercising independently;Short Term: Able to use daily as guideline for intensity in rehab;Short Term: Able to state/look up THRR       Understanding  of Exercise Prescription Yes       Intervention Provide education, explanation, and written materials on patient's individual exercise prescription       Expected Outcomes Short Term: Able to explain program exercise prescription;Long Term: Able to explain home exercise prescription to exercise independently                Exercise Goals Re-Evaluation :   Discharge Exercise Prescription (Final Exercise Prescription Changes):   Nutrition:  Target Goals: Understanding of nutrition guidelines, daily intake of sodium '1500mg'$ , cholesterol '200mg'$ , calories 30% from fat and 7% or less from saturated fats, daily to have 5 or more servings of fruits and vegetables.  Biometrics:  Pre Biometrics - 01/25/22 1315       Pre Biometrics   Waist Circumference 40 inches    Hip Circumference 42.5 inches    Waist to Hip Ratio 0.94 %    Triceps Skinfold 11 mm    % Body Fat 26.4 %    Grip Strength 32 kg    Flexibility 17.5 in    Single Leg Stand 3.12 seconds              Nutrition Therapy Plan and Nutrition Goals:   Nutrition Assessments:  Nutrition Assessments - 01/20/22 1624       Rate Your Plate Scores   Pre Score 59            MEDIFICTS Score Key: ?70 Need to make dietary changes  40-70 Heart Healthy Diet ? 40 Therapeutic Level Cholesterol Diet   Flowsheet Row INTENSIVE CARDIAC REHAB ORIENT from 01/20/2022 in [facility] for Heart, Vascular, & Lung Health  Picture Your Plate Total Score on Admission 59      Picture Your Plate Scores: <56 Unhealthy dietary pattern with much room for improvement. 41-50 Dietary pattern unlikely to meet recommendations for good health and room for improvement. 51-60 More healthful dietary pattern, with some room for improvement.  >60 Healthy dietary pattern, although there may be some specific behaviors that could be improved.    Nutrition Goals Re-Evaluation:   Nutrition Goals Re-Evaluation:   Nutrition  Goals Discharge (Final Nutrition Goals Re-Evaluation):   Psychosocial: Target Goals: Acknowledge presence or absence of significant depression and/or stress, maximize coping skills, provide positive support system. Participant is able to verbalize types and ability to use techniques and skills needed for reducing stress and depression.  Initial Review & Psychosocial Screening:  Initial Psych Review & Screening - 01/21/22 0840       Initial Review   Current issues with None Identified      Family Dynamics   Good Support System? Yes   Keidan lives alone he has a brother, sister, friends and neighbors for support     Barriers   Psychosocial barriers to participate in program There are no identifiable barriers or psychosocial needs.      Screening Interventions   Interventions Encouraged to exercise             Quality of Life Scores:  Quality of Life - 01/20/22 1457       Quality of Life   Select Quality of Life      Quality of Life Scores   Health/Function Pre 28.37 %    Socioeconomic Pre 27.36 %    Psych/Spiritual Pre 28.29 %    Family Pre 26.4 %    GLOBAL Pre 27.85 %            Scores of 19 and below usually indicate a poorer quality of life in these areas.  A difference of  2-3 points is a clinically meaningful difference.  A difference of 2-3 points in the total score of the Quality of Life Index has been associated with significant improvement in overall quality of life, self-image, physical symptoms, and general health in studies assessing change in quality of life.  PHQ-9: Review Flowsheet  More data exists      01/21/2022 01/20/2022 12/29/2021 09/10/2021 08/31/2021  Depression screen PHQ 2/9  Decreased Interest 0 0 0 0 0  Down, Depressed, Hopeless 0 0 0 0 0  PHQ - 2 Score 0 0 0 0 0  Altered sleeping - - - 0 -  Tired, decreased energy - - - 0 -  Change in appetite - - - 0 -  Feeling bad or failure about yourself  - - - 0 -  Trouble concentrating - - - 0  -  Moving slowly or fidgety/restless - - - 0 -  Suicidal thoughts - - - 0 -  PHQ-9 Score - - - 0 -   Interpretation of Total Score  Total Score Depression Severity:  1-4 = Minimal depression, 5-9 = Mild depression, 10-14 = Moderate depression, 15-19 = Moderately severe depression, 20-27 = Severe depression   Psychosocial Evaluation and Intervention:   Psychosocial Re-Evaluation:   Psychosocial Discharge (Final Psychosocial Re-Evaluation):   Vocational Rehabilitation: Provide vocational rehab assistance to qualifying candidates.   Vocational Rehab Evaluation & Intervention:  Vocational Rehab - 01/21/22 3810       Initial Vocational Rehab Evaluation & Intervention   Assessment shows need for Vocational Rehabilitation No   Lannis is retired and does not need vocational rehab at this time            Education: Education Goals: Education classes will be provided on a weekly basis, covering required topics. Participant will state understanding/return demonstration of topics presented.     Core Videos: Exercise    Move It!  Clinical staff conducted group or individual video education with verbal and written material and guidebook.  Patient learns the recommended Pritikin exercise program. Exercise with the goal of living a long, healthy life. Some of the health benefits of exercise include controlled diabetes, healthier blood pressure levels, improved cholesterol levels, improved heart and lung capacity, improved sleep, and better body composition. Everyone should speak with their doctor before starting or changing an exercise routine.  Biomechanical Limitations Clinical staff conducted group or individual video education with verbal and written material and guidebook.  Patient learns how biomechanical limitations can impact exercise and how we can mitigate and possibly overcome limitations to have an impactful and balanced exercise routine.  Body Composition Clinical staff  conducted group or individual video education with verbal and written material and guidebook.  Patient learns that body composition (ratio of muscle mass to fat mass) is a key component to assessing overall fitness, rather than body weight alone. Increased fat mass, especially visceral belly fat, can put [place] at increased risk for metabolic syndrome, type 2 diabetes, heart disease, and even death. It is recommended to combine diet and exercise (cardiovascular and resistance training) to improve your body composition. Seek guidance from your physician and exercise physiologist before implementing an exercise routine.  Exercise Action Plan Clinical staff conducted group or individual video education with verbal and written material and guidebook.  Patient learns the recommended strategies to achieve and enjoy long-term exercise adherence, including variety, self-motivation, self-efficacy, and positive decision making. Benefits of exercise include fitness, good health, weight management, more energy, better sleep, less stress, and overall well-being.  Medical   Heart Disease Risk Reduction Clinical staff conducted group or individual video education with verbal and written material and guidebook.  Patient learns our heart is our most vital organ as it circulates oxygen, nutrients, white blood cells, and hormones throughout the entire body, and carries waste away. Data supports a plant-based eating plan like the Pritikin Program for its effectiveness in slowing progression of and reversing heart disease. The video provides a number of recommendations to address heart disease.   Metabolic Syndrome and Belly Fat  Clinical staff conducted group or individual video education with verbal and written material and guidebook.  Patient learns what metabolic syndrome is, how it leads to heart disease, and how one can reverse it and keep it from coming back. You have metabolic syndrome if you have 3 of the following 5  criteria: abdominal obesity, high blood pressure, high triglycerides, low HDL cholesterol, and high blood sugar.  Hypertension and Heart Disease Clinical staff conducted group or individual video education with verbal and written material and guidebook.  Patient learns that high blood pressure, or hypertension, is very common in the [place]. Hypertension is largely due to excessive salt intake, but other important risk factors include being overweight, physical inactivity, drinking too much alcohol, smoking, and not eating enough potassium from fruits and vegetables. High blood pressure is a leading risk factor for heart attack, stroke, congestive heart failure, dementia, kidney failure, and premature death. Long-term effects of excessive salt intake include stiffening of the arteries and thickening of heart muscle and organ damage. Recommendations include ways to reduce hypertension and the risk of heart disease.  Diseases of Our Time - Focusing on Diabetes Clinical staff conducted group or individual video education with verbal and written material and guidebook.  Patient learns why the best way to stop diseases of our time is prevention, through food and other lifestyle changes. Medicine (such as prescription pills and surgeries) is often only a Band-Aid on the problem, not a long-term solution. Most common diseases of our time include obesity, type 2 diabetes, hypertension, heart disease, and cancer. The Pritikin Program is recommended and has been proven to help reduce, reverse, and/or prevent the damaging effects of metabolic syndrome.  Nutrition   Overview of the Pritikin Eating Plan  Clinical staff conducted group or individual video education with  verbal and written material and guidebook.  Patient learns about the [place] for disease risk reduction. The [place] emphasizes a wide variety of unrefined, minimally-processed carbohydrates, like fruits, vegetables,  whole grains, and legumes. Go, Caution, and Stop food choices are explained. Plant-based and lean animal proteins are emphasized. Rationale provided for low sodium intake for blood pressure control, low added sugars for blood sugar stabilization, and low added fats and oils for coronary artery disease risk reduction and weight management.  Calorie Density  Clinical staff conducted group or individual video education with verbal and written material and guidebook.  Patient learns about calorie density and how it impacts the Pritikin Eating Plan. Knowing the characteristics of the food you choose will help you decide whether those foods will lead to weight gain or weight loss, and whether you want to consume more or less of them. Weight loss is usually a side effect of the Pritikin Eating Plan because of its focus on low calorie-dense foods.  Label Reading  Clinical staff conducted group or individual video education with verbal and written material and guidebook.  Patient learns about the Pritikin recommended label reading guidelines and corresponding recommendations regarding calorie density, added sugars, sodium content, and whole grains.  Dining Out - Part 1  Clinical staff conducted group or individual video education with verbal and written material and guidebook.  Patient learns that restaurant meals can be sabotaging because they can be so high in calories, fat, sodium, and/or sugar. Patient learns recommended strategies on how to positively address this and avoid unhealthy pitfalls.  Facts on Fats  Clinical staff conducted group or individual video education with verbal and written material and guidebook.  Patient learns that lifestyle modifications can be just as effective, if not more so, as many medications for lowering your risk of heart disease. A Pritikin lifestyle can help to reduce your risk of inflammation and atherosclerosis (cholesterol build-up, or plaque, in the artery walls).  Lifestyle interventions such as dietary choices and physical activity address the cause of atherosclerosis. A review of the types of fats and their impact on blood cholesterol levels, along with dietary recommendations to reduce fat intake is also included.  Nutrition Action Plan  Clinical staff conducted group or individual video education with verbal and written material and guidebook.  Patient learns how to incorporate Pritikin recommendations into their lifestyle. Recommendations include planning and keeping personal health goals in mind as an important part of their success.  Healthy Mind-Set    Healthy Minds, Bodies, Hearts  Clinical staff conducted group or individual video education with verbal and written material and guidebook.  Patient learns how to identify when they are stressed. Video will discuss the impact of that stress, as well as the many benefits of stress management. Patient will also be introduced to stress management techniques. The way we think, act, and feel has an impact on our hearts.  How Our Thoughts Can Heal Our Hearts  Clinical staff conducted group or individual video education with verbal and written material and guidebook.  Patient learns that negative thoughts can cause depression and anxiety. This can result in negative lifestyle behavior and serious health problems. Cognitive behavioral therapy is an effective method to help control our thoughts in order to change and improve our emotional outlook.  Additional Videos:  Exercise    Improving Performance  Clinical staff conducted group or individual video education with verbal and written material and guidebook.  Patient learns to use a non-linear  approach by alternating intensity levels and lengths of time spent exercising to help burn more calories and lose more body fat. Cardiovascular exercise helps improve heart health, metabolism, hormonal balance, blood sugar control, and recovery from fatigue. Resistance  training improves strength, endurance, balance, coordination, reaction time, metabolism, and muscle mass. Flexibility exercise improves circulation, posture, and balance. Seek guidance from your physician and exercise physiologist before implementing an exercise routine and learn your capabilities and proper form for all exercise.  Introduction to Yoga  Clinical staff conducted group or individual video education with verbal and written material and guidebook.  Patient learns about yoga, a discipline of the coming together of mind, breath, and body. The benefits of yoga include improved flexibility, improved range of motion, better posture and core strength, increased lung function, weight loss, and positive self-image. Yoga's heart health benefits include lowered blood pressure, healthier heart rate, decreased cholesterol and triglyceride levels, improved immune function, and reduced stress. Seek guidance from your physician and exercise physiologist before implementing an exercise routine and learn your capabilities and proper form for all exercise.  Medical   Aging: Enhancing Your Quality of Life  Clinical staff conducted group or individual video education with verbal and written material and guidebook.  Patient learns key strategies and recommendations to stay in good physical health and enhance quality of life, such as prevention strategies, having an advocate, securing a [place], and keeping a list of medications and system for tracking them. It also discusses how to avoid risk for bone loss.  Biology of Weight Control  Clinical staff conducted group or individual video education with verbal and written material and guidebook.  Patient learns that weight gain occurs because we consume more calories than we burn (eating more, moving less). Even if your body weight is normal, you may have higher ratios of fat compared to muscle mass. Too much body fat puts you at  increased risk for cardiovascular disease, heart attack, stroke, type 2 diabetes, and obesity-related cancers. In addition to exercise, following the [place] can help reduce your risk.  Decoding Lab Results  Clinical staff conducted group or individual video education with verbal and written material and guidebook.  Patient learns that lab test reflects one measurement whose values change over time and are influenced by many factors, including medication, stress, sleep, exercise, food, hydration, pre-existing medical conditions, and more. It is recommended to use the knowledge from this video to become more involved with your lab results and evaluate your numbers to speak with your doctor.   Diseases of Our Time - Overview  Clinical staff conducted group or individual video education with verbal and written material and guidebook.  Patient learns that according to the CDC, 50% to 70% of chronic diseases (such as obesity, type 2 diabetes, elevated lipids, hypertension, and heart disease) are avoidable through lifestyle improvements including healthier food choices, listening to satiety cues, and increased physical activity.  Sleep Disorders Clinical staff conducted group or individual video education with verbal and written material and guidebook.  Patient learns how good quality and duration of sleep are important to overall health and well-being. Patient also learns about sleep disorders and how they impact health along with recommendations to address them, including discussing with a physician.  Nutrition  Dining Out - Part 2 Clinical staff conducted group or individual video education with verbal and written material and guidebook.  Patient learns how to plan ahead and communicate in order to maximize  their dining experience in a healthy and nutritious manner. Included are recommended food choices based on the type of restaurant the patient is visiting.   Fueling a Insurance account manager conducted group or individual video education with verbal and written material and guidebook.  There is a strong connection between our food choices and our health. Diseases like obesity and type 2 diabetes are very prevalent and are in large-part due to lifestyle choices. The Pritikin Eating Plan provides plenty of food and hunger-curbing satisfaction. It is easy to follow, affordable, and helps reduce health risks.  Menu Workshop  Clinical staff conducted group or individual video education with verbal and written material and guidebook.  Patient learns that restaurant meals can sabotage health goals because they are often packed with calories, fat, sodium, and sugar. Recommendations include strategies to plan ahead and to communicate with the manager, chef, or server to help order a healthier meal.  Planning Your Eating Strategy  Clinical staff conducted group or individual video education with verbal and written material and guidebook.  Patient learns about the [place] and its benefit of reducing the risk of disease. The [place] does not focus on calories. Instead, it emphasizes high-quality, nutrient-rich foods. By knowing the characteristics of the foods, we choose, we can determine their calorie density and make informed decisions.  Targeting Your Nutrition Priorities  Clinical staff conducted group or individual video education with verbal and written material and guidebook.  Patient learns that lifestyle habits have a tremendous impact on disease risk and progression. This video provides eating and physical activity recommendations based on your personal health goals, such as reducing LDL cholesterol, losing weight, preventing or controlling type 2 diabetes, and reducing high blood pressure.  Vitamins and Minerals  Clinical staff conducted group or individual video education with verbal and written material and guidebook.  Patient learns different  ways to obtain key vitamins and minerals, including through a recommended healthy diet. It is important to discuss all supplements you take with your doctor.   Healthy Mind-Set    Smoking Cessation  Clinical staff conducted group or individual video education with verbal and written material and guidebook.  Patient learns that cigarette smoking and tobacco addiction pose a serious health risk which affects millions of people. Stopping smoking will significantly reduce the risk of heart disease, lung disease, and many forms of cancer. Recommended strategies for quitting are covered, including working with your doctor to develop a successful plan.  Culinary   Becoming a Financial trader conducted group or individual video education with verbal and written material and guidebook.  Patient learns that cooking at home can be healthy, cost-effective, quick, and puts them in control. Keys to cooking healthy recipes will include looking at your recipe, assessing your equipment needs, planning ahead, making it simple, choosing cost-effective seasonal ingredients, and limiting the use of added fats, salts, and sugars.  Cooking - Breakfast and Snacks  Clinical staff conducted group or individual video education with verbal and written material and guidebook.  Patient learns how important breakfast is to satiety and nutrition through the entire day. Recommendations include key foods to eat during breakfast to help stabilize blood sugar levels and to prevent overeating at meals later in the day. Planning ahead is also a key component.  Cooking - Human resources officer conducted group or individual video education with verbal and written material and guidebook.  Patient learns eating strategies to improve  overall health, including an approach to cook more at home. Recommendations include thinking of animal protein as a side on your plate rather than center stage and focusing instead on  lower calorie dense options like vegetables, fruits, whole grains, and plant-based proteins, such as beans. Making sauces in large quantities to freeze for later and leaving the skin on your vegetables are also recommended to maximize your experience.  Cooking - Healthy Salads and Dressing Clinical staff conducted group or individual video education with verbal and written material and guidebook.  Patient learns that vegetables, fruits, whole grains, and legumes are the foundations of the [place]. Recommendations include how to incorporate each of these in flavorful and healthy salads, and how to create homemade salad dressings. Proper handling of ingredients is also covered. Cooking - Soups and [employer] - Soups and Desserts Clinical staff conducted group or individual video education with verbal and written material and guidebook.  Patient learns that Pritikin soups and desserts make for easy, nutritious, and delicious snacks and meal components that are low in sodium, fat, sugar, and calorie density, while high in vitamins, minerals, and filling fiber. Recommendations include simple and healthy ideas for soups and desserts.   Overview     The Pritikin Solution Program Overview Clinical staff conducted group or individual video education with verbal and written material and guidebook.  Patient learns that the results of the [place] Program have been documented in more than 100 articles published in peer-reviewed journals, and the benefits include reducing risk factors for (and, in some cases, even reversing) high cholesterol, high blood pressure, type 2 diabetes, obesity, and more! An overview of the three key pillars of the Pritikin Program will be covered: eating well, doing regular exercise, and having a healthy mind-set.  WORKSHOPS  Exercise: Exercise Basics: Building Your Action Plan Clinical staff led group instruction and group discussion with PowerPoint presentation  and patient guidebook. To enhance the learning environment the use of posters, models and videos may be added. At the conclusion of this workshop, patients will comprehend the difference between physical activity and exercise, as well as the benefits of incorporating both, into their routine. Patients will understand the FITT (Frequency, Intensity, Time, and Type) principle and how to use it to build an exercise action plan. In addition, safety concerns and other considerations for exercise and cardiac rehab will be addressed by the presenter. The purpose of this lesson is to promote a comprehensive and effective weekly exercise routine in order to improve patients' overall level of fitness.   Managing Heart Disease: Your Path to a Healthier Heart Clinical staff led group instruction and group discussion with PowerPoint presentation and patient guidebook. To enhance the learning environment the use of posters, models and videos may be added.At the conclusion of this workshop, patients will understand the anatomy and physiology of the heart. Additionally, they will understand how Pritikin's three pillars impact the risk factors, the progression, and the management of heart disease.  The purpose of this lesson is to provide a high-level overview of the heart, heart disease, and how the Pritikin lifestyle positively impacts risk factors.  Exercise Biomechanics Clinical staff led group instruction and group discussion with PowerPoint presentation and patient guidebook. To enhance the learning environment the use of posters, models and videos may be added. Patients will learn how the structural parts of their bodies function and how these functions impact their daily activities, movement, and exercise. Patients will learn how to promote a neutral  spine, learn how to manage pain, and identify ways to improve their physical movement in order to promote healthy living. The purpose of this lesson is to  expose patients to common physical limitations that impact physical activity. Participants will learn practical ways to adapt and manage aches and pains, and to minimize their effect on regular exercise. Patients will learn how to maintain good posture while sitting, walking, and lifting.  Balance Training and Fall Prevention  Clinical staff led group instruction and group discussion with PowerPoint presentation and patient guidebook. To enhance the learning environment the use of posters, models and videos may be added. At the conclusion of this workshop, patients will understand the importance of their sensorimotor skills (vision, proprioception, and the vestibular system) in maintaining their ability to balance as they age. Patients will apply a variety of balancing exercises that are appropriate for their current level of function. Patients will understand the common causes for poor balance, possible solutions to these problems, and ways to modify their physical environment in order to minimize their fall risk. The purpose of this lesson is to teach patients about the importance of maintaining balance as they age and ways to minimize their risk of falling.  WORKSHOPS   Nutrition:  Fueling a Scientist, research (physical sciences) led group instruction and group discussion with PowerPoint presentation and patient guidebook. To enhance the learning environment the use of posters, models and videos may be added. Patients will review the foundational principles of the [place] and understand what constitutes a serving size in each of the food groups. Patients will also learn Pritikin-friendly foods that are better choices when away from home and review make-ahead meal and snack options. Calorie density will be reviewed and applied to three nutrition priorities: weight maintenance, weight loss, and weight gain. The purpose of this lesson is to reinforce (in a group setting) the key concepts around  what patients are recommended to eat and how to apply these guidelines when away from home by planning and selecting Pritikin-friendly options. Patients will understand how calorie density may be adjusted for different weight management goals.  Mindful Eating  Clinical staff led group instruction and group discussion with PowerPoint presentation and patient guidebook. To enhance the learning environment the use of posters, models and videos may be added. Patients will briefly review the concepts of the [place] and the importance of low-calorie dense foods. The concept of mindful eating will be introduced as well as the importance of paying attention to internal hunger signals. Triggers for non-hunger eating and techniques for dealing with triggers will be explored. The purpose of this lesson is to provide patients with the opportunity to review the basic principles of the [place], discuss the value of eating mindfully and how to measure internal cues of hunger and fullness using the Hunger Scale. Patients will also discuss reasons for non-hunger eating and learn strategies to use for controlling emotional eating.  Targeting Your Nutrition Priorities Clinical staff led group instruction and group discussion with PowerPoint presentation and patient guidebook. To enhance the learning environment the use of posters, models and videos may be added. Patients will learn how to determine their genetic susceptibility to disease by reviewing their family history. Patients will gain insight into the importance of diet as part of an overall healthy lifestyle in mitigating the impact of genetics and other environmental insults. The purpose of this lesson is to provide patients with the opportunity to assess their personal nutrition priorities  by looking at their family history, their own health history and current risk factors. Patients will also be able to discuss ways of prioritizing and  modifying the [place] for their highest risk areas  Menu  Clinical staff led group instruction and group discussion with PowerPoint presentation and patient guidebook. To enhance the learning environment the use of posters, models and videos may be added. Using menus brought in from [employer], or printed from [employer], patients will apply the [place] dining out guidelines that were presented in the [employer] video. Patients will also be able to practice these guidelines in a variety of provided scenarios. The purpose of this lesson is to provide patients with the opportunity to practice hands-on learning of the [place] with actual menus and practice scenarios.  Label Reading Clinical staff led group instruction and group discussion with PowerPoint presentation and patient guidebook. To enhance the learning environment the use of posters, models and videos may be added. Patients will review and discuss the Pritikin label reading guidelines presented in Pritikin's Label Reading Educational series video. Using fool labels brought in from local grocery stores and markets, patients will apply the label reading guidelines and determine if the packaged food meet the Pritikin guidelines. The purpose of this lesson is to provide patients with the opportunity to review, discuss, and practice hands-on learning of the Pritikin Label Reading guidelines with actual packaged food labels. [place] Workshops are designed to teach patients ways to prepare quick, simple, and affordable recipes at home. The importance of nutrition's role in chronic disease risk reduction is reflected in its emphasis in the overall Pritikin program. By learning how to prepare essential core Pritikin Eating Plan recipes, patients will increase control over what they eat; be able to customize the flavor of foods without the use of added salt,  sugar, or fat; and improve the quality of the food they consume. By learning a set of core recipes which are easily assembled, quickly prepared, and affordable, patients are more likely to prepare more healthy foods at home. These workshops focus on convenient breakfasts, simple entres, side dishes, and desserts which can be prepared with minimal effort and are consistent with nutrition recommendations for cardiovascular risk reduction. Cooking [employer] are taught by a Engineer, materials (RD) who has been trained by the [employer]. The chef or RD has a clear understanding of the importance of minimizing - if not completely eliminating - added fat, sugar, and sodium in recipes. Throughout the series of [place] Workshop sessions, patients will learn about healthy ingredients and efficient methods of cooking to build confidence in their capability to prepare    Cooking School weekly topics:  Adding Flavor- Sodium-Free  Fast and Healthy Breakfasts  Powerhouse Plant-Based Proteins  Satisfying Salads and Dressings  Simple Sides and Sauces  International Cuisine-Spotlight on the [place] Zones  Delicious Desserts  Savory Soups  Efficiency Cooking - Meals in a Snap  Tasty Appetizers and Snacks  Comforting Weekend Breakfasts  One-Pot Wonders   Fast Evening Meals  Easy [place] (Psychosocial): New Thoughts, New Behaviors Clinical staff led group instruction and group discussion with PowerPoint presentation and patient guidebook. To enhance the learning environment the use of posters, models and videos may be added. Patients will learn and practice techniques for developing effective health and lifestyle goals. Patients will be able  to effectively apply the goal setting process learned to develop at least one new personal goal.  The purpose of this lesson is to expose patients to a new skill set of  behavior modification techniques such as techniques setting SMART goals, overcoming barriers, and achieving new thoughts and new behaviors.  Managing Moods and Relationships Clinical staff led group instruction and group discussion with PowerPoint presentation and patient guidebook. To enhance the learning environment the use of posters, models and videos may be added. Patients will learn how emotional and chronic stress factors can impact their health and relationships. They will learn healthy ways to manage their moods and utilize positive coping mechanisms. In addition, ICR patients will learn ways to improve communication skills. The purpose of this lesson is to expose patients to ways of understanding how one's mood and health are intimately connected. Developing a healthy outlook can help build positive relationships and connections with others. Patients will understand the importance of utilizing effective communication skills that include actively listening and being heard. They will learn and understand the importance of the "4 Cs" and especially Connections in fostering of a Healthy Mind-Set.  Healthy Sleep for a Healthy Heart Clinical staff led group instruction and group discussion with PowerPoint presentation and patient guidebook. To enhance the learning environment the use of posters, models and videos may be added. At the conclusion of this workshop, patients will be able to demonstrate knowledge of the importance of sleep to overall health, well-being, and quality of life. They will understand the symptoms of, and treatments for, common sleep disorders. Patients will also be able to identify daytime and nighttime behaviors which impact sleep, and they will be able to apply these tools to help manage sleep-related challenges. The purpose of this lesson is to provide patients with a general overview of sleep and outline the importance of quality sleep. Patients will learn about a few of the most  common sleep disorders. Patients will also be introduced to the concept of "sleep hygiene," and discover ways to self-manage certain sleeping problems through simple daily behavior changes. Finally, the workshop will motivate patients by clarifying the links between quality sleep and their goals of heart-healthy living.   Recognizing and Reducing Stress Clinical staff led group instruction and group discussion with PowerPoint presentation and patient guidebook. To enhance the learning environment the use of posters, models and videos may be added. At the conclusion of this workshop, patients will be able to understand the types of stress reactions, differentiate between acute and chronic stress, and recognize the impact that chronic stress has on their health. They will also be able to apply different coping mechanisms, such as reframing negative self-talk. Patients will have the opportunity to practice a variety of stress management techniques, such as deep abdominal breathing, progressive muscle relaxation, and/or guided imagery.  The purpose of this lesson is to educate patients on the role of stress in their lives and to provide healthy techniques for coping with it.  Learning Barriers/Preferences:  Learning Barriers/Preferences - 01/20/22 1547       Learning Barriers/Preferences   Learning Barriers Sight   wears glasses   Learning Preferences Audio;Skilled Demonstration;Computer/Internet;Verbal Instruction;Group Instruction;Video;Individual Instruction;Written Material;Pictoral             Education Topics:  Knowledge Questionnaire Score:  Knowledge Questionnaire Score - 01/20/22 1458       Knowledge Questionnaire Score   Pre Score 19/24             Core Components/Risk  Factors/Patient Goals at Admission:  Personal Goals and Risk Factors at Admission - 01/20/22 1547       Core Components/Risk Factors/Patient Goals on Admission    Weight Management Yes;Weight Maintenance     Intervention Weight Management: Develop a combined nutrition and exercise program designed to reach desired caloric intake, while maintaining appropriate intake of nutrient and fiber, sodium and fats, and appropriate energy expenditure required for the weight goal.;Weight Management: Provide education and appropriate resources to help participant work on and attain dietary goals.    Expected Outcomes Weight Maintenance: Understanding of the daily nutrition guidelines, which includes 25-35% calories from fat, 7% or less cal from saturated fats, less than '200mg'$  cholesterol, less than 1.5gm of sodium, & 5 or more servings of fruits and vegetables daily;Weight Loss: Understanding of general recommendations for a balanced deficit meal plan, which promotes 1-2 lb weight loss per week and includes a negative energy balance of [id number] kcal/d    Diabetes Yes    Intervention Provide education about signs/symptoms and action to take for hypo/hyperglycemia.;Provide education about proper nutrition, including hydration, and aerobic/resistive exercise prescription along with prescribed medications to achieve blood glucose in normal ranges: Fasting glucose 65-99 mg/dL    Expected Outcomes Short Term: Participant verbalizes understanding of the signs/symptoms and immediate care of hyper/hypoglycemia, proper foot care and importance of medication, aerobic/resistive exercise and nutrition plan for blood glucose control.;Long Term: Attainment of HbA1C < 7%.    Hypertension Yes    Intervention Provide education on lifestyle modifcations including regular physical activity/exercise, weight management, moderate sodium restriction and increased consumption of fresh fruit, vegetables, and low fat dairy, alcohol moderation, and smoking cessation.;Monitor prescription use compliance.    Expected Outcomes Short Term: Continued assessment and intervention until BP is < 140/74m HG in hypertensive participants. < 130/875mHG in  hypertensive participants with diabetes, heart failure or chronic kidney disease.;Long Term: Maintenance of blood pressure at goal levels.    Lipids Yes    Intervention Provide education and support for participant on nutrition & aerobic/resistive exercise along with prescribed medications to achieve LDL '70mg'$ , HDL >'40mg'$ .    Expected Outcomes Short Term: Participant states understanding of desired cholesterol values and is compliant with medications prescribed. Participant is following exercise prescription and nutrition guidelines.;Long Term: Cholesterol controlled with medications as prescribed, with individualized exercise RX and with personalized nutrition plan. Value goals: LDL < '70mg'$ , HDL > 40 mg.             Core Components/Risk Factors/Patient Goals Review:    Core Components/Risk Factors/Patient Goals at Discharge (Final Review):    ITP Comments:  ITP Comments     Row Name 01/21/22 0839           ITP Comments Introduction to [facility]/ Intensive Cardiac Rehab. Initial Orientatin Packet Reviewed with the patient                Comments: Participant attended orientation for the cardiac rehabilitation program on  01/25/2022  to perform initial intake and exercise walk test. [place] and orientation packet was reviewed. Completed 6-minute walk test, measurements, initial ITP, and exercise prescription. Vital signs stable. Telemetry-normal sinus rhythm, rare PVC,asymptomatic.   Service time was from 1305 to 1443.

## 2022-01-26 ENCOUNTER — Ambulatory Visit (INDEPENDENT_AMBULATORY_CARE_PROVIDER_SITE_OTHER): Payer: Medicare Other

## 2022-01-26 ENCOUNTER — Other Ambulatory Visit: Payer: Self-pay | Admitting: Internal Medicine

## 2022-01-26 ENCOUNTER — Encounter (HOSPITAL_COMMUNITY)
Admission: RE | Admit: 2022-01-26 | Discharge: 2022-01-26 | Disposition: A | Discharge status: Home / Self Care | Payer: Medicare Other | Source: Ambulatory Visit | Attending: Cardiovascular Disease | Admitting: Cardiovascular Disease

## 2022-01-26 DIAGNOSIS — Z9889 Other specified postprocedural states: Secondary | ICD-10-CM

## 2022-01-26 DIAGNOSIS — I059 Rheumatic mitral valve disease, unspecified: Secondary | ICD-10-CM

## 2022-01-26 DIAGNOSIS — I4891 Unspecified atrial fibrillation: Secondary | ICD-10-CM | POA: Diagnosis not present

## 2022-01-26 DIAGNOSIS — Z7901 Long term (current) use of anticoagulants: Secondary | ICD-10-CM | POA: Diagnosis not present

## 2022-01-26 DIAGNOSIS — Z951 Presence of aortocoronary bypass graft: Secondary | ICD-10-CM

## 2022-01-26 LAB — GLUCOSE, CAPILLARY: Glucose-Capillary: 106 mg/dL — ABNORMAL HIGH (ref 70–99)

## 2022-01-26 LAB — POCT INR: INR: 1.6 — AB (ref 2.0–3.0)

## 2022-01-26 NOTE — Patient Instructions (Signed)
Description   Spoke with [name], [place] and advised to have pt take 3 tablets today and then START taking 2 tablets daily EXCEPT 2.5 tablets on Sundays, Tuesdays, and Thursdays.  Stay consistent with greens each week.  Recheck INR in 1 week - discharged from [facility] [phone] * Amio '200mg'$  BID X 7 days, then '200mg'$  daily*

## 2022-01-26 NOTE — Telephone Encounter (Signed)
[facility] calls today to inform us that they are still not able to start PT's rehab. PT's blood sugar level read 68 this morning . They were able to get it up close to 106 but still not high enough for PT to be performing any sort of exercise. PT is still taking their prescribed dosage of the Metformin but has cut back their Starlix. I did inform [facility] that Dr.Plotnikov is still out of the office and does not have anyone currently covering for him.  Is there a provider that would be able to advise on this?

## 2022-01-26 NOTE — Progress Notes (Signed)
Incomplete Session Note  Patient Details  Name: Jon [name] MRN: [id number] Date of Birth: Jan 17, 1946 Referring Provider:   Flowsheet Row INTENSIVE CARDIAC REHAB ORIENT from 01/25/2022 in [facility] [place]  Referring Provider [name], MD       [name] did not complete his rehab session.  CBG 94 this morning. Bohdan had cereal banana and coffee and an egg sandwich. I had the patient wash his hands with warm water recheck CBG 104. CBG's are still running below 100 at home. Having to still eat a lot to keep his CBg at an acceptable level. Gordon said that he decreased his starlix to 60 mg 3 times a day as Dr Alain Marion ordered. Dr Plotnikov's office was called and notified spoke with Alroy Dust. Mishon did not exercise today due to low CBG. Elber plans to return to exercise when able. Will forward today note to Dr Alain Marion for review.[name] RN BSN

## 2022-01-27 DIAGNOSIS — R001 Bradycardia, unspecified: Secondary | ICD-10-CM

## 2022-01-27 DIAGNOSIS — I119 Hypertensive heart disease without heart failure: Secondary | ICD-10-CM

## 2022-01-27 DIAGNOSIS — Z87891 Personal history of nicotine dependence: Secondary | ICD-10-CM

## 2022-01-27 DIAGNOSIS — G709 Myoneural disorder, unspecified: Secondary | ICD-10-CM

## 2022-01-27 DIAGNOSIS — Z48812 Encounter for surgical aftercare following surgery on the circulatory system: Secondary | ICD-10-CM

## 2022-01-27 DIAGNOSIS — Z9221 Personal history of antineoplastic chemotherapy: Secondary | ICD-10-CM

## 2022-01-27 DIAGNOSIS — I25110 Atherosclerotic heart disease of native coronary artery with unstable angina pectoris: Secondary | ICD-10-CM | POA: Diagnosis not present

## 2022-01-27 DIAGNOSIS — G5701 Lesion of sciatic nerve, right lower limb: Secondary | ICD-10-CM

## 2022-01-27 DIAGNOSIS — Z5181 Encounter for therapeutic drug level monitoring: Secondary | ICD-10-CM

## 2022-01-27 DIAGNOSIS — Z951 Presence of aortocoronary bypass graft: Secondary | ICD-10-CM

## 2022-01-27 DIAGNOSIS — R55 Syncope and collapse: Secondary | ICD-10-CM

## 2022-01-27 DIAGNOSIS — K219 Gastro-esophageal reflux disease without esophagitis: Secondary | ICD-10-CM

## 2022-01-27 DIAGNOSIS — Z955 Presence of coronary angioplasty implant and graft: Secondary | ICD-10-CM

## 2022-01-27 DIAGNOSIS — I081 Rheumatic disorders of both mitral and tricuspid valves: Secondary | ICD-10-CM

## 2022-01-27 DIAGNOSIS — I4892 Unspecified atrial flutter: Secondary | ICD-10-CM | POA: Diagnosis not present

## 2022-01-27 DIAGNOSIS — H409 Unspecified glaucoma: Secondary | ICD-10-CM

## 2022-01-27 DIAGNOSIS — I480 Paroxysmal atrial fibrillation: Secondary | ICD-10-CM | POA: Diagnosis not present

## 2022-01-27 DIAGNOSIS — E119 Type 2 diabetes mellitus without complications: Secondary | ICD-10-CM

## 2022-01-27 DIAGNOSIS — Z7982 Long term (current) use of aspirin: Secondary | ICD-10-CM

## 2022-01-27 DIAGNOSIS — Z85028 Personal history of other malignant neoplasm of stomach: Secondary | ICD-10-CM

## 2022-01-27 DIAGNOSIS — D62 Acute posthemorrhagic anemia: Secondary | ICD-10-CM

## 2022-01-27 DIAGNOSIS — I252 Old myocardial infarction: Secondary | ICD-10-CM | POA: Diagnosis not present

## 2022-01-27 DIAGNOSIS — G4733 Obstructive sleep apnea (adult) (pediatric): Secondary | ICD-10-CM

## 2022-01-27 DIAGNOSIS — K5790 Diverticulosis of intestine, part unspecified, without perforation or abscess without bleeding: Secondary | ICD-10-CM

## 2022-01-27 DIAGNOSIS — N179 Acute kidney failure, unspecified: Secondary | ICD-10-CM

## 2022-01-27 DIAGNOSIS — Z923 Personal history of irradiation: Secondary | ICD-10-CM

## 2022-01-27 DIAGNOSIS — Z79891 Long term (current) use of opiate analgesic: Secondary | ICD-10-CM

## 2022-01-27 DIAGNOSIS — Z7901 Long term (current) use of anticoagulants: Secondary | ICD-10-CM

## 2022-01-27 NOTE — Telephone Encounter (Signed)
Okay to stop metformin Thank you

## 2022-01-27 NOTE — Telephone Encounter (Signed)
Called [facility] spoke w/ Carlette inform them that pt need ov for low BS, per chart has one schedule 02/04/22. Carlette states will hold pt PT (physically) until BS has gown up.Marland KitchenChryl Heck

## 2022-01-27 NOTE — Telephone Encounter (Signed)
Called pt no answer LMOM RTC.../lmb 

## 2022-01-27 NOTE — Telephone Encounter (Signed)
Called [name] back no answer LMOM w/ MD response,,,/lmb

## 2022-01-28 ENCOUNTER — Encounter (HOSPITAL_COMMUNITY): Payer: Medicare Other

## 2022-01-28 ENCOUNTER — Telehealth (HOSPITAL_COMMUNITY): Payer: Self-pay | Admitting: *Deleted

## 2022-01-28 NOTE — Telephone Encounter (Signed)
Pt return call gave MD response.Marland KitchenJohny [name]

## 2022-01-28 NOTE — Telephone Encounter (Signed)
Mr Capri came by and said that his metformin was discontinued. Mr Doshi said that Dr Alain Marion discontinued his metformin. Mr Gulbranson said that his home CBG was 107. Will hold off on the patient starting exercise until he follows up with Dr Alain Marion next week Patient states understanding. Will cancel next weeks appointments.[name] RN BSN

## 2022-02-01 NOTE — Progress Notes (Signed)
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 Swallow whole., Disp: 30 tablet, Rfl: 12   atorvastatin (LIPITOR) 80 MG tablet, Take 1 tablet (80 mg total) by mouth daily. (Patient taking differently: Take 40 mg by mouth daily.), Disp: 90 tablet, Rfl: 3   Cholecalciferol (VITAMIN D-3 PO), Take 1 capsule by mouth daily., Disp: , Rfl:    EPINEPHrine 0.3 mg/0.3 mL IJ SOAJ injection, Inject 0.3 mg into the muscle as needed for anaphylaxis., Disp: 1 each, Rfl: 0   midodrine (PROAMATINE) 5 MG tablet, Take 5 mg by mouth 2 (two) times daily with a meal. PT IS BEING TAPERED OFF OF MEDICATION, Disp: , Rfl:    Multiple Vitamin (MULTIVITAMIN WITH MINERALS) TABS tablet, Take 1 tablet by mouth 3 (three) times a week., Disp: , Rfl:    Multiple Vitamins-Minerals (MULTIVITAMIN ADULTS 50+ PO), Take 1 tablet by mouth daily., Disp: , Rfl:    nateglinide (STARLIX) 120 MG tablet, Take 0.5  tablets (60 mg total) by mouth 3 (three) times daily with meals., Disp: 270 tablet, Rfl: 1   spironolactone (ALDACTONE) 25 MG tablet, Take 1 tablet (25 mg total) by mouth daily. (Patient taking differently: Take 12.5 mg by mouth daily.), Disp: 30 tablet, Rfl: 3   torsemide (DEMADEX) 10 MG tablet, Take 1 tablet (10 mg total) by mouth daily., Disp: 30 tablet, Rfl: 3   warfarin (COUMADIN) 2.5 MG tablet, TAKE  1.5 TABLETS TO 2 TABLETS BY MOUTH DAILY OR AS DIRECTED BY COUMADIN CLINIC. (Patient taking differently: Take 5 mg by mouth daily. 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 pylori infection 2011   Heart murmur    History of radiation therapy 09/20/10 thru 10/29/10   gastroesophageal junction/gastric cardia   Hypertension    Neuromuscular disorder ([place])    Other and  unspecified hyperlipidemia    Sleep apnea    On CPAP   Status post chemotherapy 09/22/10 thru 10/27/10   concurrent w/radiation   Unspecified sleep apnea     Tobacco Use: Social History   Tobacco Use  Smoking Status Former   Packs/day: 1.00   Years: 40.00   Total pack years: 40.00   Types: Cigarettes   Quit date: 01/31/2005   Years since quitting: 17.0  Smokeless Tobacco Never    Labs: Review Flowsheet  More data exists      Latest Ref Rng & Units 09/02/2021 10/13/2021 10/14/2021 10/21/2021 10/22/2021  Labs for ITP Cardiac and Pulmonary Rehab  Cholestrol 0 - 200 mg/dL - - 135  - -   LDL (calc) 0 - 99 mg/dL - - 60  - -  HDL-C >40 mg/dL - - 42  - -  Trlycerides <150 mg/dL - - 164  - -  Hemoglobin A1c 4.6 - 6.5 % 7.1  - - - -  PH, Arterial 7.35 - 7.45 - 7.276  7.336  - 7.45  7.293  7.310  7.316  7.356  7.497  7.449  7.335   PCO2 arterial 32 - 48 mmHg - 48.3  39.4  - 35  38.4  38.7  36.0  37.8  33.9  37.7  52.6   Bicarbonate 20.0 - 28.0 mmol/L - 19.2  22.5  21.1  - 24.3  18.4  19.4  18.4  21.4  26.2  26.1  25.3  28.0   TCO2 22 - 32 mmol/L - '20  24  22  21  '$ - - '20  21  19  23  25  27  26  27  26  27  30  25  29   '$ Acid-base deficit 0.0 - 2.0 mmol/L - 6.0  5.0  4.0  - - 7.0  6.0  7.0  4.0  1.0   O2 Saturation % - 96  76  100  - 99.2  97  98  98  99  100  100  89  100     Capillary Blood Glucose: Lab Results  Component Value Date   GLUCAP 106 (H) 01/26/2022   GLUCAP 104 (H) 01/25/2022   GLUCAP 105 (H) 01/25/2022   GLUCAP 97 01/25/2022   GLUCAP 98 01/20/2022     Exercise Target Goals: Exercise Program Goal: Individual exercise prescription set using results from initial 6 min walk test and THRR while considering  patient's activity barriers and safety.   Exercise Prescription Goal: Initial exercise prescription builds to 30-45 minutes a day of aerobic activity, 2-3 days per week.  Home exercise guidelines will be given to patient during program as part of exercise prescription that the participant will acknowledge.  Activity Barriers & Risk Stratification:  Activity Barriers & Cardiac Risk Stratification - 01/25/22 1528       Activity Barriers & Cardiac Risk Stratification   Activity Barriers Deconditioning;Back Problems;Balance Concerns;Muscular Weakness    Cardiac Risk Stratification High             6 Minute Walk:  6 Minute Walk     Row Name 01/25/22 1419         6 Minute Walk   Phase Initial     Distance 1346 feet     Walk Time 6 minutes     # of Rest Breaks 0     MPH 2.55     METS 2.6     RPE 11  Perceived Dyspnea  0     VO2 Peak 9.01      Symptoms No     Resting HR 57 bpm     Resting BP 99/66     Resting Oxygen Saturation  98 %     Exercise Oxygen Saturation  during 6 min walk 100 %     Max Ex. HR 85 bpm     Max Ex. BP 137/90     2 Minute Post BP 123/84              Oxygen Initial Assessment:   Oxygen Re-Evaluation:   Oxygen Discharge (Final Oxygen Re-Evaluation):   Initial Exercise Prescription:  Initial Exercise Prescription - 01/25/22 1500       Date of Initial Exercise RX and Referring Provider   Date 01/25/22    Referring Provider [name], MD    Expected Discharge Date 03/18/22      Recumbant Bike   Level 2    RPM 60    Watts 15    Minutes 15    METs 2.6      Arm Ergometer   Level 2    Watts 37    RPM 60    Minutes 15    METs 2.6      Prescription Details   Frequency (times per week) 3    Duration Progress to 30 minutes of continuous aerobic without signs/symptoms of physical distress      Intensity   THRR 40-80% of Max Heartrate 58-115    Ratings of Perceived Exertion 11-13    Perceived Dyspnea 0-4      Progression   Progression Continue progressive overload as per policy without signs/symptoms or physical distress.      Resistance Training   Training Prescription Yes    Weight 3 lbs    Reps 10-15             Perform Capillary Blood Glucose checks as needed.  Exercise Prescription Changes:   Exercise Comments:   Exercise Goals and Review:   Exercise Goals     Row Name 01/25/22 1530             Exercise Goals   Increase Physical Activity Yes       Intervention Provide advice, education, support and counseling about physical activity/exercise needs.;Develop an individualized exercise prescription for aerobic and resistive training based on initial evaluation findings, risk stratification, comorbidities and participant's personal goals.       Expected Outcomes Short Term: Attend rehab on a regular basis to increase amount of physical  activity.;Long Term: Add in home exercise to make exercise part of routine and to increase amount of physical activity.;Long Term: Exercising regularly at least 3-5 days a week.       Increase Strength and Stamina Yes       Intervention Provide advice, education, support and counseling about physical activity/exercise needs.;Develop an individualized exercise prescription for aerobic and resistive training based on initial evaluation findings, risk stratification, comorbidities and participant's personal goals.       Expected Outcomes Short Term: Increase workloads from initial exercise prescription for resistance, speed, and METs.;Short Term: Perform resistance training exercises routinely during rehab and add in resistance training at home;Long Term: Improve cardiorespiratory fitness, muscular endurance and strength as measured by increased METs and functional capacity (6MWT)       Able to understand and use rate of perceived exertion (RPE) scale Yes       Intervention Provide education  and explanation on how to use RPE scale       Expected Outcomes Short Term: Able to use RPE daily in rehab to express subjective intensity level;Long Term:  Able to use RPE to guide intensity level when exercising independently       Knowledge and understanding of Target Heart Rate Range (THRR) Yes       Intervention Provide education and explanation of THRR including how the numbers were predicted and where they are located for reference       Expected Outcomes Long Term: Able to use THRR to govern intensity when exercising independently;Short Term: Able to use daily as guideline for intensity in rehab;Short Term: Able to state/look up THRR       Understanding of Exercise Prescription Yes       Intervention Provide education, explanation, and written materials on patient's individual exercise prescription       Expected Outcomes Short Term: Able to explain program exercise prescription;Long Term: Able to explain home  exercise prescription to exercise independently                Exercise Goals Re-Evaluation :  Exercise Goals Re-Evaluation     Row Name 02/01/22 0820             Exercise Goal Re-Evaluation   Comments Patient unable to exercise due to low blood sugar. Will resume exercise on 02/07/22.       Expected Outcomes Review goals up return to exercise.                Discharge Exercise Prescription (Final Exercise Prescription Changes):   Nutrition:  Target Goals: Understanding of nutrition guidelines, daily intake of sodium '1500mg'$ , cholesterol '200mg'$ , calories 30% from fat and 7% or less from saturated fats, daily to have 5 or more servings of fruits and vegetables.  Biometrics:  Pre Biometrics - 01/25/22 1315       Pre Biometrics   Waist Circumference 40 inches    Hip Circumference 42.5 inches    Waist to Hip Ratio 0.94 %    Triceps Skinfold 11 mm    % Body Fat 26.4 %    Grip Strength 32 kg    Flexibility 17.5 in    Single Leg Stand 3.12 seconds              Nutrition Therapy Plan and Nutrition Goals:   Nutrition Assessments:  Nutrition Assessments - 01/20/22 1624       Rate Your Plate Scores   Pre Score 59            MEDIFICTS Score Key: ?70 Need to make dietary changes  40-70 Heart Healthy Diet ? 40 Therapeutic Level Cholesterol Diet   Flowsheet Row INTENSIVE CARDIAC REHAB ORIENT from 01/20/2022 in [facility]  Picture Your Plate Total Score on Admission 59      Picture Your Plate Scores: <97 Unhealthy dietary pattern with much room for improvement. 41-50 Dietary pattern unlikely to meet recommendations for good health and room for improvement. 51-60 More healthful dietary pattern, with some room for improvement.  >60 Healthy dietary pattern, although there may be some specific behaviors that could be improved.    Nutrition Goals Re-Evaluation:   Nutrition Goals  Re-Evaluation:   Nutrition Goals Discharge (Final Nutrition Goals Re-Evaluation):   Psychosocial: Target Goals: Acknowledge presence or absence of significant depression and/or stress, maximize coping skills, provide positive support system. Participant is able to  verbalize types and ability to use techniques and skills needed for reducing stress and depression.  Initial Review & Psychosocial Screening:  Initial Psych Review & Screening - 01/21/22 0840       Initial Review   Current issues with None Identified      Family Dynamics   Good Support System? Yes   Safi lives alone he has a brother, sister, friends and neighbors for support     Barriers   Psychosocial barriers to participate in program There are no identifiable barriers or psychosocial needs.      Screening Interventions   Interventions Encouraged to exercise             Quality of Life Scores:  Quality of Life - 01/20/22 1457       Quality of Life   Select Quality of Life      Quality of Life Scores   Health/Function Pre 28.37 %    Socioeconomic Pre 27.36 %    Psych/Spiritual Pre 28.29 %    Family Pre 26.4 %    GLOBAL Pre 27.85 %            Scores of 19 and below usually indicate a poorer quality of life in these areas.  A difference of  2-3 points is a clinically meaningful difference.  A difference of 2-3 points in the total score of the Quality of Life Index has been associated with significant improvement in overall quality of life, self-image, physical symptoms, and general health in studies assessing change in quality of life.  PHQ-9: Review Flowsheet  More data exists      01/21/2022 01/20/2022 12/29/2021 09/10/2021 08/31/2021  Depression screen PHQ 2/9  Decreased Interest 0 0 0 0 0  Down, Depressed, Hopeless 0 0 0 0 0  PHQ - 2 Score 0 0 0 0 0  Altered sleeping - - - 0 -  Tired, decreased energy - - - 0 -  Change in appetite - - - 0 -  Feeling bad or failure about yourself  - - - 0 -   Trouble concentrating - - - 0 -  Moving slowly or fidgety/restless - - - 0 -  Suicidal thoughts - - - 0 -  PHQ-9 Score - - - 0 -   Interpretation of Total Score  Total Score Depression Severity:  1-4 = Minimal depression, 5-9 = Mild depression, 10-14 = Moderate depression, 15-19 = Moderately severe depression, 20-27 = Severe depression   Psychosocial Evaluation and Intervention:   Psychosocial Re-Evaluation:   Psychosocial Discharge (Final Psychosocial Re-Evaluation):   Vocational Rehabilitation: Provide vocational rehab assistance to qualifying candidates.   Vocational Rehab Evaluation & Intervention:  Vocational Rehab - 01/21/22 1884       Initial Vocational Rehab Evaluation & Intervention   Assessment shows need for Vocational Rehabilitation No   Coreon is retired and does not need vocational rehab at this time            Education: Education Goals: Education classes will be provided on a weekly basis, covering required topics. Participant will state understanding/return demonstration of topics presented.     Core Videos: Exercise    Move It!  Clinical staff conducted group or individual video education with verbal and written material and guidebook.  Patient learns the recommended Pritikin exercise program. Exercise with the goal of living a long, healthy life. Some of the health benefits of exercise include controlled diabetes, healthier blood pressure levels, improved cholesterol levels, improved heart and  lung capacity, improved sleep, and better body composition. Everyone should speak with their doctor before starting or changing an exercise routine.  Biomechanical Limitations Clinical staff conducted group or individual video education with verbal and written material and guidebook.  Patient learns how biomechanical limitations can impact exercise and how we can mitigate and possibly overcome limitations to have an impactful and balanced exercise  routine.  Body Composition Clinical staff conducted group or individual video education with verbal and written material and guidebook.  Patient learns that body composition (ratio of muscle mass to fat mass) is a key component to assessing overall fitness, rather than body weight alone. Increased fat mass, especially visceral belly fat, can put [place] at increased risk for metabolic syndrome, type 2 diabetes, heart disease, and even death. It is recommended to combine diet and exercise (cardiovascular and resistance training) to improve your body composition. Seek guidance from your physician and exercise physiologist before implementing an exercise routine.  Exercise Action Plan Clinical staff conducted group or individual video education with verbal and written material and guidebook.  Patient learns the recommended strategies to achieve and enjoy long-term exercise adherence, including variety, self-motivation, self-efficacy, and positive decision making. Benefits of exercise include fitness, good health, weight management, more energy, better sleep, less stress, and overall well-being.  Medical   Heart Disease Risk Reduction Clinical staff conducted group or individual video education with verbal and written material and guidebook.  Patient learns our heart is our most vital organ as it circulates oxygen, nutrients, white blood cells, and hormones throughout the entire body, and carries waste away. Data supports a plant-based eating plan like the Pritikin Program for its effectiveness in slowing progression of and reversing heart disease. The video provides a number of recommendations to address heart disease.   Metabolic Syndrome and Belly Fat  Clinical staff conducted group or individual video education with verbal and written material and guidebook.  Patient learns what metabolic syndrome is, how it leads to heart disease, and how one can reverse it and keep it from coming back. You have  metabolic syndrome if you have 3 of the following 5 criteria: abdominal obesity, high blood pressure, high triglycerides, low HDL cholesterol, and high blood sugar.  Hypertension and Heart Disease Clinical staff conducted group or individual video education with verbal and written material and guidebook.  Patient learns that high blood pressure, or hypertension, is very common in the [place]. Hypertension is largely due to excessive salt intake, but other important risk factors include being overweight, physical inactivity, drinking too much alcohol, smoking, and not eating enough potassium from fruits and vegetables. High blood pressure is a leading risk factor for heart attack, stroke, congestive heart failure, dementia, kidney failure, and premature death. Long-term effects of excessive salt intake include stiffening of the arteries and thickening of heart muscle and organ damage. Recommendations include ways to reduce hypertension and the risk of heart disease.  Diseases of Our Time - Focusing on Diabetes Clinical staff conducted group or individual video education with verbal and written material and guidebook.  Patient learns why the best way to stop diseases of our time is prevention, through food and other lifestyle changes. Medicine (such as prescription pills and surgeries) is often only a Band-Aid on the problem, not a long-term solution. Most common diseases of our time include obesity, type 2 diabetes, hypertension, heart disease, and cancer. The Pritikin Program is recommended and has been proven to help reduce, reverse, and/or prevent the damaging effects of  metabolic syndrome.  Nutrition   Overview of the Pritikin Eating Plan  Clinical staff conducted group or individual video education with verbal and written material and guidebook.  Patient learns about the [place] for disease risk reduction. The [place] emphasizes a wide variety of unrefined,  minimally-processed carbohydrates, like fruits, vegetables, whole grains, and legumes. Go, Caution, and Stop food choices are explained. Plant-based and lean animal proteins are emphasized. Rationale provided for low sodium intake for blood pressure control, low added sugars for blood sugar stabilization, and low added fats and oils for coronary artery disease risk reduction and weight management.  Calorie Density  Clinical staff conducted group or individual video education with verbal and written material and guidebook.  Patient learns about calorie density and how it impacts the Pritikin Eating Plan. Knowing the characteristics of the food you choose will help you decide whether those foods will lead to weight gain or weight loss, and whether you want to consume more or less of them. Weight loss is usually a side effect of the Pritikin Eating Plan because of its focus on low calorie-dense foods.  Label Reading  Clinical staff conducted group or individual video education with verbal and written material and guidebook.  Patient learns about the Pritikin recommended label reading guidelines and corresponding recommendations regarding calorie density, added sugars, sodium content, and whole grains.  Dining Out - Part 1  Clinical staff conducted group or individual video education with verbal and written material and guidebook.  Patient learns that restaurant meals can be sabotaging because they can be so high in calories, fat, sodium, and/or sugar. Patient learns recommended strategies on how to positively address this and avoid unhealthy pitfalls.  Facts on Fats  Clinical staff conducted group or individual video education with verbal and written material and guidebook.  Patient learns that lifestyle modifications can be just as effective, if not more so, as many medications for lowering your risk of heart disease. A Pritikin lifestyle can help to reduce your risk of inflammation and atherosclerosis  (cholesterol build-up, or plaque, in the artery walls). Lifestyle interventions such as dietary choices and physical activity address the cause of atherosclerosis. A review of the types of fats and their impact on blood cholesterol levels, along with dietary recommendations to reduce fat intake is also included.  Nutrition Action Plan  Clinical staff conducted group or individual video education with verbal and written material and guidebook.  Patient learns how to incorporate Pritikin recommendations into their lifestyle. Recommendations include planning and keeping personal health goals in mind as an important part of their success.  Healthy Mind-Set    Healthy Minds, Bodies, Hearts  Clinical staff conducted group or individual video education with verbal and written material and guidebook.  Patient learns how to identify when they are stressed. Video will discuss the impact of that stress, as well as the many benefits of stress management. Patient will also be introduced to stress management techniques. The way we think, act, and feel has an impact on our hearts.  How Our Thoughts Can Heal Our Hearts  Clinical staff conducted group or individual video education with verbal and written material and guidebook.  Patient learns that negative thoughts can cause depression and anxiety. This can result in negative lifestyle behavior and serious health problems. Cognitive behavioral therapy is an effective method to help control our thoughts in order to change and improve our emotional outlook.  Additional Videos:  Exercise    Improving Performance  Clinical staff conducted group or individual video education with verbal and written material and guidebook.  Patient learns to use a non-linear approach by alternating intensity levels and lengths of time spent exercising to help burn more calories and lose more body fat. Cardiovascular exercise helps improve heart health, metabolism, hormonal balance,  blood sugar control, and recovery from fatigue. Resistance training improves strength, endurance, balance, coordination, reaction time, metabolism, and muscle mass. Flexibility exercise improves circulation, posture, and balance. Seek guidance from your physician and exercise physiologist before implementing an exercise routine and learn your capabilities and proper form for all exercise.  Introduction to Yoga  Clinical staff conducted group or individual video education with verbal and written material and guidebook.  Patient learns about yoga, a discipline of the coming together of mind, breath, and body. The benefits of yoga include improved flexibility, improved range of motion, better posture and core strength, increased lung function, weight loss, and positive self-image. Yoga's heart health benefits include lowered blood pressure, healthier heart rate, decreased cholesterol and triglyceride levels, improved immune function, and reduced stress. Seek guidance from your physician and exercise physiologist before implementing an exercise routine and learn your capabilities and proper form for all exercise.  Medical   Aging: Enhancing Your Quality of Life  Clinical staff conducted group or individual video education with verbal and written material and guidebook.  Patient learns key strategies and recommendations to stay in good physical health and enhance quality of life, such as prevention strategies, having an advocate, securing a [place], and keeping a list of medications and system for tracking them. It also discusses how to avoid risk for bone loss.  Biology of Weight Control  Clinical staff conducted group or individual video education with verbal and written material and guidebook.  Patient learns that weight gain occurs because we consume more calories than we burn (eating more, moving less). Even if your body weight is normal, you may have higher ratios of fat  compared to muscle mass. Too much body fat puts you at increased risk for cardiovascular disease, heart attack, stroke, type 2 diabetes, and obesity-related cancers. In addition to exercise, following the [place] can help reduce your risk.  Decoding Lab Results  Clinical staff conducted group or individual video education with verbal and written material and guidebook.  Patient learns that lab test reflects one measurement whose values change over time and are influenced by many factors, including medication, stress, sleep, exercise, food, hydration, pre-existing medical conditions, and more. It is recommended to use the knowledge from this video to become more involved with your lab results and evaluate your numbers to speak with your doctor.   Diseases of Our Time - Overview  Clinical staff conducted group or individual video education with verbal and written material and guidebook.  Patient learns that according to the CDC, 50% to 70% of chronic diseases (such as obesity, type 2 diabetes, elevated lipids, hypertension, and heart disease) are avoidable through lifestyle improvements including healthier food choices, listening to satiety cues, and increased physical activity.  Sleep Disorders Clinical staff conducted group or individual video education with verbal and written material and guidebook.  Patient learns how good quality and duration of sleep are important to overall health and well-being. Patient also learns about sleep disorders and how they impact health along with recommendations to address them, including discussing with a physician.  Nutrition  Dining Out - Part 2 Clinical staff conducted group or individual  video education with verbal and written material and guidebook.  Patient learns how to plan ahead and communicate in order to maximize their dining experience in a healthy and nutritious manner. Included are recommended food choices based on the type of restaurant  the patient is visiting.   Fueling a Best boy conducted group or individual video education with verbal and written material and guidebook.  There is a strong connection between our food choices and our health. Diseases like obesity and type 2 diabetes are very prevalent and are in large-part due to lifestyle choices. The Pritikin Eating Plan provides plenty of food and hunger-curbing satisfaction. It is easy to follow, affordable, and helps reduce health risks.  Menu Workshop  Clinical staff conducted group or individual video education with verbal and written material and guidebook.  Patient learns that restaurant meals can sabotage health goals because they are often packed with calories, fat, sodium, and sugar. Recommendations include strategies to plan ahead and to communicate with the manager, chef, or server to help order a healthier meal.  Planning Your Eating Strategy  Clinical staff conducted group or individual video education with verbal and written material and guidebook.  Patient learns about the [place] and its benefit of reducing the risk of disease. The [place] does not focus on calories. Instead, it emphasizes high-quality, nutrient-rich foods. By knowing the characteristics of the foods, we choose, we can determine their calorie density and make informed decisions.  Targeting Your Nutrition Priorities  Clinical staff conducted group or individual video education with verbal and written material and guidebook.  Patient learns that lifestyle habits have a tremendous impact on disease risk and progression. This video provides eating and physical activity recommendations based on your personal health goals, such as reducing LDL cholesterol, losing weight, preventing or controlling type 2 diabetes, and reducing high blood pressure.  Vitamins and Minerals  Clinical staff conducted group or individual video education with verbal and written  material and guidebook.  Patient learns different ways to obtain key vitamins and minerals, including through a recommended healthy diet. It is important to discuss all supplements you take with your doctor.   Healthy Mind-Set    Smoking Cessation  Clinical staff conducted group or individual video education with verbal and written material and guidebook.  Patient learns that cigarette smoking and tobacco addiction pose a serious health risk which affects millions of people. Stopping smoking will significantly reduce the risk of heart disease, lung disease, and many forms of cancer. Recommended strategies for quitting are covered, including working with your doctor to develop a successful plan.  Culinary   Becoming a Financial trader conducted group or individual video education with verbal and written material and guidebook.  Patient learns that cooking at home can be healthy, cost-effective, quick, and puts them in control. Keys to cooking healthy recipes will include looking at your recipe, assessing your equipment needs, planning ahead, making it simple, choosing cost-effective seasonal ingredients, and limiting the use of added fats, salts, and sugars.  Cooking - Breakfast and Snacks  Clinical staff conducted group or individual video education with verbal and written material and guidebook.  Patient learns how important breakfast is to satiety and nutrition through the entire day. Recommendations include key foods to eat during breakfast to help stabilize blood sugar levels and to prevent overeating at meals later in the day. Planning ahead is also a key component.  Cooking - Human resources officer  Clinical staff conducted group or individual video education with verbal and written material and guidebook.  Patient learns eating strategies to improve overall health, including an approach to cook more at home. Recommendations include thinking of animal protein as a side on your plate  rather than center stage and focusing instead on lower calorie dense options like vegetables, fruits, whole grains, and plant-based proteins, such as beans. Making sauces in large quantities to freeze for later and leaving the skin on your vegetables are also recommended to maximize your experience.  Cooking - Healthy Salads and Dressing Clinical staff conducted group or individual video education with verbal and written material and guidebook.  Patient learns that vegetables, fruits, whole grains, and legumes are the foundations of the [place]. Recommendations include how to incorporate each of these in flavorful and healthy salads, and how to create homemade salad dressings. Proper handling of ingredients is also covered. Cooking - Soups and [employer] - Soups and Desserts Clinical staff conducted group or individual video education with verbal and written material and guidebook.  Patient learns that Pritikin soups and desserts make for easy, nutritious, and delicious snacks and meal components that are low in sodium, fat, sugar, and calorie density, while high in vitamins, minerals, and filling fiber. Recommendations include simple and healthy ideas for soups and desserts.   Overview     The Pritikin Solution Program Overview Clinical staff conducted group or individual video education with verbal and written material and guidebook.  Patient learns that the results of the [place] Program have been documented in more than 100 articles published in peer-reviewed journals, and the benefits include reducing risk factors for (and, in some cases, even reversing) high cholesterol, high blood pressure, type 2 diabetes, obesity, and more! An overview of the three key pillars of the Pritikin Program will be covered: eating well, doing regular exercise, and having a healthy mind-set.  WORKSHOPS  Exercise: Exercise Basics: Building Your Action Plan Clinical staff led group instruction  and group discussion with PowerPoint presentation and patient guidebook. To enhance the learning environment the use of posters, models and videos may be added. At the conclusion of this workshop, patients will comprehend the difference between physical activity and exercise, as well as the benefits of incorporating both, into their routine. Patients will understand the FITT (Frequency, Intensity, Time, and Type) principle and how to use it to build an exercise action plan. In addition, safety concerns and other considerations for exercise and cardiac rehab will be addressed by the presenter. The purpose of this lesson is to promote a comprehensive and effective weekly exercise routine in order to improve patients' overall level of fitness.   Managing Heart Disease: Your Path to a Healthier Heart Clinical staff led group instruction and group discussion with PowerPoint presentation and patient guidebook. To enhance the learning environment the use of posters, models and videos may be added.At the conclusion of this workshop, patients will understand the anatomy and physiology of the heart. Additionally, they will understand how Pritikin's three pillars impact the risk factors, the progression, and the management of heart disease.  The purpose of this lesson is to provide a high-level overview of the heart, heart disease, and how the Pritikin lifestyle positively impacts risk factors.  Exercise Biomechanics Clinical staff led group instruction and group discussion with PowerPoint presentation and patient guidebook. To enhance the learning environment the use of posters, models and videos may be added. Patients will learn how the structural parts of  their bodies function and how these functions impact their daily activities, movement, and exercise. Patients will learn how to promote a neutral spine, learn how to manage pain, and identify ways to improve their physical movement in order to  promote healthy living. The purpose of this lesson is to expose patients to common physical limitations that impact physical activity. Participants will learn practical ways to adapt and manage aches and pains, and to minimize their effect on regular exercise. Patients will learn how to maintain good posture while sitting, walking, and lifting.  Balance Training and Fall Prevention  Clinical staff led group instruction and group discussion with PowerPoint presentation and patient guidebook. To enhance the learning environment the use of posters, models and videos may be added. At the conclusion of this workshop, patients will understand the importance of their sensorimotor skills (vision, proprioception, and the vestibular system) in maintaining their ability to balance as they age. Patients will apply a variety of balancing exercises that are appropriate for their current level of function. Patients will understand the common causes for poor balance, possible solutions to these problems, and ways to modify their physical environment in order to minimize their fall risk. The purpose of this lesson is to teach patients about the importance of maintaining balance as they age and ways to minimize their risk of falling.  WORKSHOPS   Nutrition:  Fueling a Scientist, research (physical sciences) led group instruction and group discussion with PowerPoint presentation and patient guidebook. To enhance the learning environment the use of posters, models and videos may be added. Patients will review the foundational principles of the [place] and understand what constitutes a serving size in each of the food groups. Patients will also learn Pritikin-friendly foods that are better choices when away from home and review make-ahead meal and snack options. Calorie density will be reviewed and applied to three nutrition priorities: weight maintenance, weight loss, and weight gain. The purpose of this lesson is to  reinforce (in a group setting) the key concepts around what patients are recommended to eat and how to apply these guidelines when away from home by planning and selecting Pritikin-friendly options. Patients will understand how calorie density may be adjusted for different weight management goals.  Mindful Eating  Clinical staff led group instruction and group discussion with PowerPoint presentation and patient guidebook. To enhance the learning environment the use of posters, models and videos may be added. Patients will briefly review the concepts of the [place] and the importance of low-calorie dense foods. The concept of mindful eating will be introduced as well as the importance of paying attention to internal hunger signals. Triggers for non-hunger eating and techniques for dealing with triggers will be explored. The purpose of this lesson is to provide patients with the opportunity to review the basic principles of the [place], discuss the value of eating mindfully and how to measure internal cues of hunger and fullness using the Hunger Scale. Patients will also discuss reasons for non-hunger eating and learn strategies to use for controlling emotional eating.  Targeting Your Nutrition Priorities Clinical staff led group instruction and group discussion with PowerPoint presentation and patient guidebook. To enhance the learning environment the use of posters, models and videos may be added. Patients will learn how to determine their genetic susceptibility to disease by reviewing their family history. Patients will gain insight into the importance of diet as part of an overall healthy lifestyle in mitigating the impact of genetics  and other environmental insults. The purpose of this lesson is to provide patients with the opportunity to assess their personal nutrition priorities by looking at their family history, their own health history and current risk factors. Patients will  also be able to discuss ways of prioritizing and modifying the [place] for their highest risk areas  Menu  Clinical staff led group instruction and group discussion with PowerPoint presentation and patient guidebook. To enhance the learning environment the use of posters, models and videos may be added. Using menus brought in from [employer], or printed from [employer], patients will apply the [place] dining out guidelines that were presented in the [employer] video. Patients will also be able to practice these guidelines in a variety of provided scenarios. The purpose of this lesson is to provide patients with the opportunity to practice hands-on learning of the [place] with actual menus and practice scenarios.  Label Reading Clinical staff led group instruction and group discussion with PowerPoint presentation and patient guidebook. To enhance the learning environment the use of posters, models and videos may be added. Patients will review and discuss the Pritikin label reading guidelines presented in Pritikin's Label Reading Educational series video. Using fool labels brought in from local grocery stores and markets, patients will apply the label reading guidelines and determine if the packaged food meet the Pritikin guidelines. The purpose of this lesson is to provide patients with the opportunity to review, discuss, and practice hands-on learning of the Pritikin Label Reading guidelines with actual packaged food labels. [place] Workshops are designed to teach patients ways to prepare quick, simple, and affordable recipes at home. The importance of nutrition's role in chronic disease risk reduction is reflected in its emphasis in the overall Pritikin program. By learning how to prepare essential core Pritikin Eating Plan recipes, patients will increase control over what they eat; be able to customize the  flavor of foods without the use of added salt, sugar, or fat; and improve the quality of the food they consume. By learning a set of core recipes which are easily assembled, quickly prepared, and affordable, patients are more likely to prepare more healthy foods at home. These workshops focus on convenient breakfasts, simple entres, side dishes, and desserts which can be prepared with minimal effort and are consistent with nutrition recommendations for cardiovascular risk reduction. Cooking [employer] are taught by a Engineer, materials (RD) who has been trained by the [employer]. The chef or RD has a clear understanding of the importance of minimizing - if not completely eliminating - added fat, sugar, and sodium in recipes. Throughout the series of [place] Workshop sessions, patients will learn about healthy ingredients and efficient methods of cooking to build confidence in their capability to prepare    Cooking School weekly topics:  Adding Flavor- Sodium-Free  Fast and Healthy Breakfasts  Powerhouse Plant-Based Proteins  Satisfying Salads and Dressings  Simple Sides and Sauces  International Cuisine-Spotlight on the [place] Zones  Delicious Desserts  Savory Soups  Efficiency Cooking - Meals in a Snap  Tasty Appetizers and Snacks  Comforting Weekend Breakfasts  One-Pot Wonders   Fast Evening Meals  Easy [place] (Psychosocial): New Thoughts, New Behaviors Clinical staff led group instruction and group discussion with PowerPoint presentation and patient guidebook. To enhance the learning environment the use of posters, models  and videos may be added. Patients will learn and practice techniques for developing effective health and lifestyle goals. Patients will be able to effectively apply the goal setting process learned to develop at least one new personal goal.  The purpose of this  lesson is to expose patients to a new skill set of behavior modification techniques such as techniques setting SMART goals, overcoming barriers, and achieving new thoughts and new behaviors.  Managing Moods and Relationships Clinical staff led group instruction and group discussion with PowerPoint presentation and patient guidebook. To enhance the learning environment the use of posters, models and videos may be added. Patients will learn how emotional and chronic stress factors can impact their health and relationships. They will learn healthy ways to manage their moods and utilize positive coping mechanisms. In addition, ICR patients will learn ways to improve communication skills. The purpose of this lesson is to expose patients to ways of understanding how one's mood and health are intimately connected. Developing a healthy outlook can help build positive relationships and connections with others. Patients will understand the importance of utilizing effective communication skills that include actively listening and being heard. They will learn and understand the importance of the "4 Cs" and especially Connections in fostering of a Healthy Mind-Set.  Healthy Sleep for a Healthy Heart Clinical staff led group instruction and group discussion with PowerPoint presentation and patient guidebook. To enhance the learning environment the use of posters, models and videos may be added. At the conclusion of this workshop, patients will be able to demonstrate knowledge of the importance of sleep to overall health, well-being, and quality of life. They will understand the symptoms of, and treatments for, common sleep disorders. Patients will also be able to identify daytime and nighttime behaviors which impact sleep, and they will be able to apply these tools to help manage sleep-related challenges. The purpose of this lesson is to provide patients with a general overview of sleep and outline the importance of quality  sleep. Patients will learn about a few of the most common sleep disorders. Patients will also be introduced to the concept of "sleep hygiene," and discover ways to self-manage certain sleeping problems through simple daily behavior changes. Finally, the workshop will motivate patients by clarifying the links between quality sleep and their goals of heart-healthy living.   Recognizing and Reducing Stress Clinical staff led group instruction and group discussion with PowerPoint presentation and patient guidebook. To enhance the learning environment the use of posters, models and videos may be added. At the conclusion of this workshop, patients will be able to understand the types of stress reactions, differentiate between acute and chronic stress, and recognize the impact that chronic stress has on their health. They will also be able to apply different coping mechanisms, such as reframing negative self-talk. Patients will have the opportunity to practice a variety of stress management techniques, such as deep abdominal breathing, progressive muscle relaxation, and/or guided imagery.  The purpose of this lesson is to educate patients on the role of stress in their lives and to provide healthy techniques for coping with it.  Learning Barriers/Preferences:  Learning Barriers/Preferences - 01/20/22 1547       Learning Barriers/Preferences   Learning Barriers Sight   wears glasses   Learning Preferences Audio;Skilled Demonstration;Computer/Internet;Verbal Instruction;Group Instruction;Video;Individual Instruction;Written Material;Pictoral             Education Topics:  Knowledge Questionnaire Score:  Knowledge Questionnaire Score - 01/20/22 1458  Knowledge Questionnaire Score   Pre Score 19/24             Core Components/Risk Factors/Patient Goals at Admission:  Personal Goals and Risk Factors at Admission - 01/20/22 1547       Core Components/Risk Factors/Patient Goals on  Admission    Weight Management Yes;Weight Maintenance    Intervention Weight Management: Develop a combined nutrition and exercise program designed to reach desired caloric intake, while maintaining appropriate intake of nutrient and fiber, sodium and fats, and appropriate energy expenditure required for the weight goal.;Weight Management: Provide education and appropriate resources to help participant work on and attain dietary goals.    Expected Outcomes Weight Maintenance: Understanding of the daily nutrition guidelines, which includes 25-35% calories from fat, 7% or less cal from saturated fats, less than '200mg'$  cholesterol, less than 1.5gm of sodium, & 5 or more servings of fruits and vegetables daily;Weight Loss: Understanding of general recommendations for a balanced deficit meal plan, which promotes 1-2 lb weight loss per week and includes a negative energy balance of [phone] kcal/d    Diabetes Yes    Intervention Provide education about signs/symptoms and action to take for hypo/hyperglycemia.;Provide education about proper nutrition, including hydration, and aerobic/resistive exercise prescription along with prescribed medications to achieve blood glucose in normal ranges: Fasting glucose 65-99 mg/dL    Expected Outcomes Short Term: Participant verbalizes understanding of the signs/symptoms and immediate care of hyper/hypoglycemia, proper foot care and importance of medication, aerobic/resistive exercise and nutrition plan for blood glucose control.;Long Term: Attainment of HbA1C < 7%.    Hypertension Yes    Intervention Provide education on lifestyle modifcations including regular physical activity/exercise, weight management, moderate sodium restriction and increased consumption of fresh fruit, vegetables, and low fat dairy, alcohol moderation, and smoking cessation.;Monitor prescription use compliance.    Expected Outcomes Short Term: Continued assessment and intervention until BP is < 140/37m  HG in hypertensive participants. < 130/817mHG in hypertensive participants with diabetes, heart failure or chronic kidney disease.;Long Term: Maintenance of blood pressure at goal levels.    Lipids Yes    Intervention Provide education and support for participant on nutrition & aerobic/resistive exercise along with prescribed medications to achieve LDL '70mg'$ , HDL >'40mg'$ .    Expected Outcomes Short Term: Participant states understanding of desired cholesterol values and is compliant with medications prescribed. Participant is following exercise prescription and nutrition guidelines.;Long Term: Cholesterol controlled with medications as prescribed, with individualized exercise RX and with personalized nutrition plan. Value goals: LDL < '70mg'$ , HDL > 40 mg.             Core Components/Risk Factors/Patient Goals Review:    Core Components/Risk Factors/Patient Goals at Discharge (Final Review):    ITP Comments:  ITP Comments     Row Name 01/21/22 0839 02/01/22 0851         ITP Comments Introduction to [facility]/ Intensive Cardiac Rehab. Initial Orientatin Packet Reviewed with the patient 30 Day ITP review. Rahil's cardiac rehab is currently on hold until aftter he sees Dr PlAlain Marionn 02/04/22 as his CBG's have been low. HeApolinarhould start intensive cardiac rehab on 02/07/22.               Comments: See ITP Comments

## 2022-02-02 ENCOUNTER — Encounter (HOSPITAL_COMMUNITY): Payer: Medicare Other

## 2022-02-04 ENCOUNTER — Encounter: Payer: Self-pay | Admitting: Internal Medicine

## 2022-02-04 ENCOUNTER — Encounter (HOSPITAL_COMMUNITY): Payer: Medicare Other

## 2022-02-04 ENCOUNTER — Other Ambulatory Visit: Payer: Self-pay | Admitting: *Deleted

## 2022-02-04 ENCOUNTER — Ambulatory Visit: Payer: Medicare Other | Attending: Cardiology | Admitting: *Deleted

## 2022-02-04 ENCOUNTER — Telehealth (HOSPITAL_COMMUNITY): Payer: Self-pay | Admitting: *Deleted

## 2022-02-04 ENCOUNTER — Ambulatory Visit (INDEPENDENT_AMBULATORY_CARE_PROVIDER_SITE_OTHER): Payer: Medicare Other | Admitting: Internal Medicine

## 2022-02-04 VITALS — BP 118/68 | HR 59 | Temp 99.1°F | Ht 70.0 in | Wt 189.6 lb

## 2022-02-04 DIAGNOSIS — R600 Localized edema: Secondary | ICD-10-CM | POA: Diagnosis not present

## 2022-02-04 DIAGNOSIS — Z8679 Personal history of other diseases of the circulatory system: Secondary | ICD-10-CM | POA: Diagnosis not present

## 2022-02-04 DIAGNOSIS — Z7901 Long term (current) use of anticoagulants: Secondary | ICD-10-CM

## 2022-02-04 DIAGNOSIS — I480 Paroxysmal atrial fibrillation: Secondary | ICD-10-CM

## 2022-02-04 DIAGNOSIS — I4891 Unspecified atrial fibrillation: Secondary | ICD-10-CM

## 2022-02-04 DIAGNOSIS — I2510 Atherosclerotic heart disease of native coronary artery without angina pectoris: Secondary | ICD-10-CM | POA: Diagnosis not present

## 2022-02-04 DIAGNOSIS — I059 Rheumatic mitral valve disease, unspecified: Secondary | ICD-10-CM | POA: Diagnosis not present

## 2022-02-04 DIAGNOSIS — Z9889 Other specified postprocedural states: Secondary | ICD-10-CM | POA: Diagnosis not present

## 2022-02-04 DIAGNOSIS — E1159 Type 2 diabetes mellitus with other circulatory complications: Secondary | ICD-10-CM

## 2022-02-04 DIAGNOSIS — I2583 Coronary atherosclerosis due to lipid rich plaque: Secondary | ICD-10-CM

## 2022-02-04 LAB — POCT INR: INR: 1.6 — AB (ref 2.0–3.0)

## 2022-02-04 MED ORDER — WARFARIN SODIUM 2.5 MG PO TABS: ORAL_TABLET | ORAL | 1 refills | Status: DC

## 2022-02-04 NOTE — Assessment & Plan Note (Signed)
In NSR D/c Amiodarone On ASA, Coumadin

## 2022-02-04 NOTE — Assessment & Plan Note (Signed)
Read about K2 and MK7 vitamins ASA, Lipitor S/p CABG x3 on Sept 22, 2023

## 2022-02-04 NOTE — Telephone Encounter (Signed)
Spoke with Vignesh he plans to return to exercise on Monday and says that his CBG's are better since his metformin has been discontinued.[name] RN BSN

## 2022-02-04 NOTE — Assessment & Plan Note (Signed)
On ASA, Coumadin In NSR

## 2022-02-04 NOTE — Patient Instructions (Addendum)
Description   Today take 3 tablets of warfarin then start taking 2.5 tablets daily EXCEPT 2 tablets on Wednesdays. Stay consistent with greens each week. Recheck INR in 1 week. Coumadin Clinic [phone] * Amio '200mg'$  BID X 7 days, then '200mg'$  daily*

## 2022-02-04 NOTE — Patient Instructions (Signed)
You can try CoQ10  Read about K2 and MK7 vitamins

## 2022-02-04 NOTE — Progress Notes (Signed)
Subjective:  Patient ID: Jon [name], male    DOB: April 19, 1945  Age: 77 y.o. MRN: [id number]  CC: Follow-up (3 week f/u- pt states he is out of the Amiodarone not sure if he need to take. Med was rx by one of the Dr @ hosp)   HPI Jon [name] presents for edema - better, weakness - better  Outpatient Medications Prior to Visit  Medication Sig Dispense Refill   acetaminophen (TYLENOL) 500 MG tablet Take 500-1,000 mg by mouth daily as needed for headache or mild pain.     aspirin EC 81 MG tablet Take 1 tablet (81 mg total) by mouth daily. Swallow whole. 30 tablet 12   atorvastatin (LIPITOR) 80 MG tablet Take 1 tablet (80 mg total) by mouth daily. (Patient taking differently: Take 40 mg by mouth daily.) 90 tablet 3   Cholecalciferol (VITAMIN D-3 PO) Take 1 capsule by mouth daily.     EPINEPHrine 0.3 mg/0.3 mL IJ SOAJ injection Inject 0.3 mg into the muscle as needed for anaphylaxis. 1 each 0   midodrine (PROAMATINE) 5 MG tablet Take 5 mg by mouth 2 (two) times daily with a meal. PT IS BEING TAPERED OFF OF MEDICATION     Multiple Vitamin (MULTIVITAMIN WITH MINERALS) TABS tablet Take 1 tablet by mouth 3 (three) times a week.     Multiple Vitamins-Minerals (MULTIVITAMIN ADULTS 50+ PO) Take 1 tablet by mouth daily.     nateglinide (STARLIX) 120 MG tablet Take 0.5 tablets (60 mg total) by mouth 3 (three) times daily with meals. 270 tablet 1   omeprazole (PRILOSEC) 40 MG capsule TAKE 1 CAPSULE BY MOUTH DAILY 90 capsule 3   spironolactone (ALDACTONE) 25 MG tablet Take 1 tablet (25 mg total) by mouth daily. (Patient taking differently: Take 12.5 mg by mouth daily.) 30 tablet 3   torsemide (DEMADEX) 10 MG tablet Take 1 tablet (10 mg total) by mouth daily. 30 tablet 3   warfarin (COUMADIN) 2.5 MG tablet TAKE  1.5 TABLETS TO 2 TABLETS BY MOUTH DAILY OR AS DIRECTED BY COUMADIN CLINIC. (Patient taking differently: Take 5 mg by mouth daily. TAKE  1.5 TABLETS TO 2 TABLETS BY MOUTH DAILY OR AS  DIRECTED BY COUMADIN CLINIC) 50 tablet 1   amiodarone (PACERONE) 200 MG tablet Take '200mg'$  twice per day for 7 days then take '200mg'$  once per day thereafter (Patient not taking: Reported on 02/04/2022) 60 tablet 1   No facility-administered medications prior to visit.    ROS: Review of Systems  Constitutional:  Negative for appetite change, fatigue and unexpected weight change.  HENT:  Negative for congestion, nosebleeds, sneezing, sore throat and trouble swallowing.   Eyes:  Negative for itching and visual disturbance.  Respiratory:  Negative for cough.   Cardiovascular:  Negative for chest pain, palpitations and leg swelling.  Gastrointestinal:  Negative for abdominal distention, blood in stool, diarrhea and nausea.  Genitourinary:  Negative for frequency and hematuria.  Musculoskeletal:  Negative for back pain, gait problem, joint swelling and neck pain.  Skin:  Negative for rash.  Neurological:  Positive for weakness. Negative for dizziness, tremors and speech difficulty.  Psychiatric/Behavioral:  Negative for agitation, dysphoric mood and sleep disturbance. The patient is not nervous/anxious.     Objective:  BP 118/68 (BP Location: Left Arm)   Pulse (!) 59   Temp 99.1 F (37.3 C) (Oral)   Ht '5\' 10"'$  (1.778 m)   Wt 189 lb 9.6 oz (86 kg)   SpO2  97%   BMI 27.20 kg/m   BP Readings from Last 3 Encounters:  02/04/22 118/68  01/25/22 99/66  01/20/22 98/60    Wt Readings from Last 3 Encounters:  02/04/22 189 lb 9.6 oz (86 kg)  01/25/22 190 lb 7.6 oz (86.4 kg)  01/20/22 190 lb 0.6 oz (86.2 kg)    Physical Exam Constitutional:      General: He is not in acute distress.    Appearance: Normal appearance. He is well-developed.     Comments: NAD  Eyes:     Conjunctiva/sclera: Conjunctivae normal.     Pupils: Pupils are equal, round, and reactive to light.  Neck:     Thyroid: No thyromegaly.     Vascular: No JVD.  Cardiovascular:     Rate and Rhythm: Normal rate and regular  rhythm.     Heart sounds: Normal heart sounds. No murmur heard.    No friction rub. No gallop.  Pulmonary:     Effort: Pulmonary effort is normal. No respiratory distress.     Breath sounds: Normal breath sounds. No wheezing or rales.  Chest:     Chest wall: No tenderness.  Abdominal:     General: Bowel sounds are normal. There is no distension.     Palpations: Abdomen is soft. There is no mass.     Tenderness: There is no abdominal tenderness. There is no guarding or rebound.  Musculoskeletal:        General: No tenderness. Normal range of motion.     Cervical back: Normal range of motion.  Lymphadenopathy:     Cervical: No cervical adenopathy.  Skin:    General: Skin is warm and dry.     Findings: No rash.  Neurological:     Mental Status: He is alert and oriented to person, place, and time.     Cranial Nerves: No cranial nerve deficit.     Motor: No abnormal muscle tone.     Coordination: Coordination normal.     Gait: Gait normal.     Deep Tendon Reflexes: Reflexes are normal and symmetric.  Psychiatric:        Behavior: Behavior normal.        Thought Content: Thought content normal.        Judgment: Judgment normal.     Lab Results  Component Value Date   WBC 11.2 (H) 11/01/2021   HGB 8.2 (L) 11/01/2021   HCT 25.1 (L) 11/01/2021   PLT 652 (H) 11/01/2021   GLUCOSE 145 (H) 11/02/2021   CHOL 135 10/14/2021   TRIG 164 (H) 10/14/2021   HDL 42 10/14/2021   LDLDIRECT 30.0 11/10/2020   LDLCALC 60 10/14/2021   ALT 29 10/21/2021   AST 30 10/21/2021   NA 138 11/02/2021   K 3.8 11/02/2021   CL 107 11/02/2021   CREATININE 1.28 (H) 11/02/2021   BUN 14 11/02/2021   CO2 23 11/02/2021   TSH 0.466 10/13/2021   PSA 1.38 06/10/2019   INR 1.6 (A) 02/04/2022   HGBA1C 7.1 (H) 09/02/2021    No results found.  Assessment & Plan:   Problem List Items Addressed This Visit       Cardiovascular and Mediastinum   PAF (paroxysmal atrial fibrillation) (HCC)    In NSR D/c  Amiodarone On ASA, Coumadin      Relevant Orders   CBC with Differential/Platelet   Comprehensive metabolic panel   Coronary artery disease - Primary    Read about K2 and MK7 vitamins  ASA, Lipitor S/p CABG x3 on Sept 22, 2023      Atrial fibrillation (HCC)    On ASA, Coumadin In NSR        Endocrine   Diabetes mellitus, type 2 ([place])   Relevant Orders   Comprehensive metabolic panel   Hemoglobin A1c     Other   Edema    Resolving Torsemide prn         No orders of the defined types were placed in this encounter.     Follow-up: No follow-ups on file.  Walker Kehr, MD

## 2022-02-04 NOTE — Assessment & Plan Note (Signed)
Resolving Torsemide prn

## 2022-02-07 ENCOUNTER — Inpatient Hospital Stay: Payer: Medicare Other | Attending: Oncology | Admitting: Oncology

## 2022-02-07 ENCOUNTER — Encounter (HOSPITAL_COMMUNITY)
Admission: RE | Admit: 2022-02-07 | Discharge: 2022-02-07 | Disposition: A | Discharge status: Home / Self Care | Payer: Medicare Other | Source: Ambulatory Visit | Attending: Cardiovascular Disease | Admitting: Cardiovascular Disease

## 2022-02-07 ENCOUNTER — Ambulatory Visit: Payer: Medicare Other | Admitting: Oncology

## 2022-02-07 ENCOUNTER — Other Ambulatory Visit (HOSPITAL_BASED_OUTPATIENT_CLINIC_OR_DEPARTMENT_OTHER): Payer: Self-pay

## 2022-02-07 ENCOUNTER — Inpatient Hospital Stay: Payer: Medicare Other

## 2022-02-07 VITALS — BP 125/84 | HR 63 | Temp 98.1°F | Resp 20 | Ht 70.0 in | Wt 190.8 lb

## 2022-02-07 DIAGNOSIS — I4891 Unspecified atrial fibrillation: Secondary | ICD-10-CM | POA: Diagnosis not present

## 2022-02-07 DIAGNOSIS — C169 Malignant neoplasm of stomach, unspecified: Secondary | ICD-10-CM

## 2022-02-07 DIAGNOSIS — Z8501 Personal history of malignant neoplasm of esophagus: Secondary | ICD-10-CM | POA: Diagnosis not present

## 2022-02-07 DIAGNOSIS — Z951 Presence of aortocoronary bypass graft: Secondary | ICD-10-CM

## 2022-02-07 DIAGNOSIS — E119 Type 2 diabetes mellitus without complications: Secondary | ICD-10-CM | POA: Insufficient documentation

## 2022-02-07 DIAGNOSIS — D509 Iron deficiency anemia, unspecified: Secondary | ICD-10-CM | POA: Insufficient documentation

## 2022-02-07 DIAGNOSIS — Z923 Personal history of irradiation: Secondary | ICD-10-CM | POA: Diagnosis not present

## 2022-02-07 DIAGNOSIS — E785 Hyperlipidemia, unspecified: Secondary | ICD-10-CM | POA: Insufficient documentation

## 2022-02-07 DIAGNOSIS — Z9221 Personal history of antineoplastic chemotherapy: Secondary | ICD-10-CM | POA: Insufficient documentation

## 2022-02-07 DIAGNOSIS — R970 Elevated carcinoembryonic antigen [CEA]: Secondary | ICD-10-CM | POA: Insufficient documentation

## 2022-02-07 DIAGNOSIS — G4730 Sleep apnea, unspecified: Secondary | ICD-10-CM | POA: Insufficient documentation

## 2022-02-07 DIAGNOSIS — Z9889 Other specified postprocedural states: Secondary | ICD-10-CM

## 2022-02-07 LAB — GLUCOSE, CAPILLARY: Glucose-Capillary: 92 mg/dL (ref 70–99)

## 2022-02-07 LAB — CEA (ACCESS): CEA (CHCC): 2.75 ng/mL (ref 0.00–5.00)

## 2022-02-07 MED ORDER — COMIRNATY 30 MCG/0.3ML IM SUSY
PREFILLED_SYRINGE | INTRAMUSCULAR | 0 refills | Status: DC
  Filled 2022-02-07: qty 0.3, 1d supply, fill #0

## 2022-02-07 NOTE — Progress Notes (Signed)
[facility] OFFICE PROGRESS NOTE   Diagnosis: Gastroesophageal cancer  INTERVAL HISTORY:   Jon [name] returns as scheduled.  He feels well.  No dysphagia.  He Coronary artery bypass surgery and mitral valve repair in September after presenting with chest pain.  He is starting cardiac rehab this week.  Objective:  Vital signs in last 24 hours:  Blood pressure 125/84, pulse 63, temperature 98.1 F (36.7 C), temperature source Oral, resp. rate 20, height '5\' 10"'$  (1.778 m), weight 190 lb 12.8 oz (86.5 kg), SpO2 98 %.     Lymphatics: No cervical, supraclavicular, axillary, or inguinal nodes Resp: Coarse end inspiratory rhonchi at the left posterior base, no respiratory distress Cardio: Regular rate and rhythm GI: No hepatosplenomegaly, no mass, nontender Vascular: Trace edema to left greater than right lower leg  Skin: Hyperpigmented mole at the right upper back (he reports this has been evaluated by dermatology)   Lab Results:  Lab Results  Component Value Date   WBC 11.2 (H) 11/01/2021   HGB 8.2 (L) 11/01/2021   HCT 25.1 (L) 11/01/2021   MCV 87.5 11/01/2021   PLT 652 (H) 11/01/2021   NEUTROABS 3.6 10/13/2021    CMP  Lab Results  Component Value Date   NA 138 11/02/2021   K 3.8 11/02/2021   CL 107 11/02/2021   CO2 23 11/02/2021   GLUCOSE 145 (H) 11/02/2021   BUN 14 11/02/2021   CREATININE 1.28 (H) 11/02/2021   CALCIUM 8.2 (L) 11/02/2021   PROT 7.3 10/21/2021   ALBUMIN 4.0 10/21/2021   AST 30 10/21/2021   ALT 29 10/21/2021   ALKPHOS 49 10/21/2021   BILITOT 0.8 10/21/2021   GFRNONAA 58 (L) 11/02/2021   GFRAA 52 (L) 11/23/2015    Lab Results  Component Value Date   CEA1 1.02 02/07/2020   CEA 2.75 02/07/2022     Medications: I have reviewed the patient's current medications.   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 Staging CT  scan 08/27/2010 revealed small gastrohepatic ligament, periportal and retrocrural lymph nodes without other evidence of metastatic disease. Staging PET scan on 09/21/2010 showed abnormal malignant range FDG uptake at the site of the biopsy-proven gastric cancer. Small perigastric lymph nodes were not FDG avid above the background level. The radiologist commented that this may reflect a benign etiology or a false-negative appearance due to the small lymph node size. There was no evidence for distant metastatic disease. The CEA was markedly elevated at 482 on 09/14/2010. He completed radiation 09/20/2010 through 10/29/2010. He received concurrent weekly Taxol/carboplatin chemotherapy 09/22/2010 through 10/27/2010. Restaging PET scan 11/22/2010 showed interval resolution of previously demonstrated hypermetabolic activity within the proximal stomach. There was no evidence of metastatic disease. Restaging PET scan 05/10/2011 showed no evidence of gastroesophageal carcinoma recurrence or metastasis. Status post upper endoscopy 12/17/2010-the very distal portion of the esophagus was ulcerated. The proximal stomach revealed a malignant appearing ulcerative mass in the region of the cardia/gastroesophageal junction. The overall bulk of the lesion was significantly improved but still quite abnormal and large. Multiple biopsies were taken. Pathology was negative for malignancy. Elevated CEA (482) on 09/14/2010.   Dysphagia secondary to the lower esophagus/upper gastric tumor, resolved. Microcytic anemia. The hemoglobin was normal on 03/31/2011. There was persistent red cell microcytosis. Hemoglobin and MCV are normal Sleep apnea. Early diabetes. History of atrial fibrillation. Hyperlipidemia. History of colon polyps. History of Helicobacter pylori infection in 2011. History  of neutropenia secondary to chemotherapy, resolved. CT of the chest on 02/15/2011 with left lower lung density/air bronchograms-? Radiation  change. Colonoscopy 03/27/2013. Mild melanosis found throughout the entire examined colon. 2 diminutive polyps found in the descending colon (Tubular adenoma. No high-grade dysplasia or malignancy noted). Moderate diverticulosis in the left colon. Followup colonoscopy 06/20/2018- polyps removed from the descending and transverse colon, tubular adenomas Nodular hyperpigmented mole at the right upper back .   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 Ragsdale is in clinical remission from gastroesophageal cancer.  The CEA is slightly higher in the normal range today.  He will return for a repeat CEA in 4 months.  He would like to continue follow-up at the Cancer center.  He will return for an office visit in 1 year.  Betsy Coder, MD  02/07/2022  9:26 AM

## 2022-02-07 NOTE — Progress Notes (Signed)
Incomplete Session Note  Patient Details  Name: Jon [name] MRN: [id number] Date of Birth: 1945-12-12 Referring Provider:   Flowsheet Row INTENSIVE CARDIAC REHAB ORIENT from 01/25/2022 in [facility] for Heart, Vascular, & [place]  Referring Provider [name], MD       [name] did not complete his rehab session.  [name]'s CBG was 92 this morning. No exercise per protocol due to low CBG. Patient said that he started taking 1/2 of a metformin twice a day as his CBG's had gotten into the 190's. [name] said that he ate cereal and a banana for breakfast this morning at 0730. Reminded [name] again to eat breakfast closer to time he is supposed to come to class. Patient states understanding and plans to return to exercise on Wednesday. Metformin is not on [name]'s medication list currently.Barnet Pall, RN,BSN 02/07/2022 10:52 AM

## 2022-02-09 ENCOUNTER — Encounter (HOSPITAL_COMMUNITY)
Admission: RE | Admit: 2022-02-09 | Discharge: 2022-02-09 | Disposition: A | Discharge status: Home / Self Care | Payer: Medicare Other | Source: Ambulatory Visit | Attending: Cardiovascular Disease | Admitting: Cardiovascular Disease

## 2022-02-09 DIAGNOSIS — Z951 Presence of aortocoronary bypass graft: Secondary | ICD-10-CM | POA: Diagnosis not present

## 2022-02-09 DIAGNOSIS — Z9889 Other specified postprocedural states: Secondary | ICD-10-CM

## 2022-02-09 LAB — GLUCOSE, CAPILLARY
Glucose-Capillary: 117 mg/dL — ABNORMAL HIGH (ref 70–99)
Glucose-Capillary: 132 mg/dL — ABNORMAL HIGH (ref 70–99)

## 2022-02-09 NOTE — Progress Notes (Signed)
Daily Session Note  Patient Details  Name: HRIDHAAN YOHN MRN: [id number] Date of Birth: [birth year] Referring Provider:   Flowsheet Row INTENSIVE CARDIAC REHAB ORIENT from 01/25/2022 in [facility] for Heart, Vascular, & [place]  Referring Provider [name], MD       Encounter Date: 02/09/2022  Check In:  Session Check In - 02/09/22 1042       Check-In   Supervising physician immediately available to respond to emergencies [facility] MD immediately available    Physician(s) Nicholes Rough PA-C    Location [facility]    Staff Present Barnet Pall, RN, Deland Pretty, MS, ACSM-CEP, Exercise Physiologist;Jetta Gilford Rile BS, ACSM-CEP, Exercise Physiologist;David Makemson, MS, ACSM-CEP, CCRP, Exercise Physiologist;Johnny Starleen Blue, MS, Exercise Physiologist;Samantha [place], RD, LDN    Virtual Visit No    Medication changes reported     No    Fall or balance concerns reported    No    Tobacco Cessation No Change    Current number of cigarettes/nicotine per day     0    Warm-up and Cool-down Performed as group-led instruction   Did not exercise today due to low CBG   Resistance Training Performed No    VAD Patient? No    PAD/SET Patient? No      Pain Assessment   Currently in Pain? No/denies    Pain Score 0-No pain    Multiple Pain Sites No             Capillary Blood Glucose: No results found for this or any previous visit (from the past 24 hour(s)).   Exercise Prescription Changes - 02/09/22 1033       Response to Exercise   Blood Pressure (Admit) 92/60    Blood Pressure (Exercise) 118/70    Blood Pressure (Exit) 105/68    Heart Rate (Admit) 66 bpm    Heart Rate (Exercise) 109 bpm    Heart Rate (Exit) 68 bpm    Rating of Perceived Exertion (Exercise) 10    Symptoms None    Comments Off to a good start with exercise.    Duration Continue with 30 min of aerobic exercise without signs/symptoms of physical distress.     Intensity THRR unchanged      Progression   Progression Continue to progress workloads to maintain intensity without signs/symptoms of physical distress.    Average METs 2.5      Resistance Training   Training Prescription No   relaxation day, no weights     Interval Training   Interval Training No      Recumbant Bike   Level 2    Minutes 15    METs 2.1      NuStep   Level 2    SPM 108    Minutes 15    METs 3             Social History   Tobacco Use  Smoking Status Former   Packs/day: 1.00   Years: 40.00   Total pack years: 40.00   Types: Cigarettes   Quit date: 01/31/2005   Years since quitting: 17.0  Smokeless Tobacco Never    Goals Met:  Exercise tolerated well No report of concerns or symptoms today  Goals Unmet:  Not Applicable  Comments: Pt started cardiac rehab today.  Pt tolerated light exercise without difficulty. VSS, telemetry-Sinus Rhythm, asymptomatic.  Medication list reconciled. Pt denies barriers to medicaiton compliance.  PSYCHOSOCIAL ASSESSMENT:  PHQ-0. Pt  exhibits positive coping skills, hopeful outlook with supportive family. No psychosocial needs identified at this time, no psychosocial interventions necessary.    Pt enjoys working on cars and fishing .   Pt oriented to exercise equipment and routine.    Understanding verbalized.[name] RN BSN    Dr. Fransico Him is Medical Director for Cardiac Rehab at [facility].

## 2022-02-10 ENCOUNTER — Ambulatory Visit
Payer: Medicare Other | Attending: Internal Medicine | Admitting: Pharmacist Clinician (PhC)/ Clinical Pharmacy Specialist

## 2022-02-10 DIAGNOSIS — Z9889 Other specified postprocedural states: Secondary | ICD-10-CM | POA: Diagnosis not present

## 2022-02-10 DIAGNOSIS — Z8679 Personal history of other diseases of the circulatory system: Secondary | ICD-10-CM | POA: Diagnosis not present

## 2022-02-10 DIAGNOSIS — I4891 Unspecified atrial fibrillation: Secondary | ICD-10-CM

## 2022-02-10 DIAGNOSIS — I480 Paroxysmal atrial fibrillation: Secondary | ICD-10-CM | POA: Diagnosis not present

## 2022-02-10 DIAGNOSIS — Z7901 Long term (current) use of anticoagulants: Secondary | ICD-10-CM | POA: Diagnosis not present

## 2022-02-10 DIAGNOSIS — I059 Rheumatic mitral valve disease, unspecified: Secondary | ICD-10-CM

## 2022-02-10 LAB — POCT INR: INR: 1.5 — AB (ref 2.0–3.0)

## 2022-02-10 NOTE — Patient Instructions (Signed)
Take 3 tablets today (Thursday January 11) then increase dose to 2.5 tablets daily except 3 tablets each Monday, Wednesday and Friday.  Repeat INR in 10 days

## 2022-02-11 ENCOUNTER — Encounter (HOSPITAL_COMMUNITY)
Admission: RE | Admit: 2022-02-11 | Discharge: 2022-02-11 | Disposition: A | Discharge status: Home / Self Care | Payer: Medicare Other | Source: Ambulatory Visit | Attending: Cardiovascular Disease | Admitting: Cardiovascular Disease

## 2022-02-11 DIAGNOSIS — Z9889 Other specified postprocedural states: Secondary | ICD-10-CM

## 2022-02-11 DIAGNOSIS — Z951 Presence of aortocoronary bypass graft: Secondary | ICD-10-CM | POA: Diagnosis not present

## 2022-02-11 LAB — GLUCOSE, CAPILLARY
Glucose-Capillary: 136 mg/dL — ABNORMAL HIGH (ref 70–99)
Glucose-Capillary: 134 mg/dL — ABNORMAL HIGH (ref 70–99)

## 2022-02-14 ENCOUNTER — Encounter (HOSPITAL_COMMUNITY)
Admission: RE | Admit: 2022-02-14 | Discharge: 2022-02-14 | Disposition: A | Discharge status: Home / Self Care | Payer: Medicare Other | Source: Ambulatory Visit | Attending: Cardiovascular Disease | Admitting: Cardiovascular Disease

## 2022-02-14 DIAGNOSIS — Z9889 Other specified postprocedural states: Secondary | ICD-10-CM | POA: Diagnosis not present

## 2022-02-14 DIAGNOSIS — Z951 Presence of aortocoronary bypass graft: Secondary | ICD-10-CM | POA: Diagnosis not present

## 2022-02-16 ENCOUNTER — Encounter (HOSPITAL_COMMUNITY)
Admission: RE | Admit: 2022-02-16 | Discharge: 2022-02-16 | Disposition: A | Discharge status: Home / Self Care | Payer: Medicare Other | Source: Ambulatory Visit | Attending: Cardiovascular Disease | Admitting: Cardiovascular Disease

## 2022-02-16 DIAGNOSIS — Z9889 Other specified postprocedural states: Secondary | ICD-10-CM | POA: Diagnosis not present

## 2022-02-16 DIAGNOSIS — Z951 Presence of aortocoronary bypass graft: Secondary | ICD-10-CM

## 2022-02-18 ENCOUNTER — Encounter (HOSPITAL_COMMUNITY)
Admission: RE | Admit: 2022-02-18 | Discharge: 2022-02-18 | Disposition: A | Discharge status: Home / Self Care | Payer: Medicare Other | Source: Ambulatory Visit | Attending: Cardiovascular Disease | Admitting: Cardiovascular Disease

## 2022-02-18 DIAGNOSIS — Z9889 Other specified postprocedural states: Secondary | ICD-10-CM

## 2022-02-18 DIAGNOSIS — Z951 Presence of aortocoronary bypass graft: Secondary | ICD-10-CM | POA: Diagnosis not present

## 2022-02-21 ENCOUNTER — Encounter (HOSPITAL_COMMUNITY)
Admission: RE | Admit: 2022-02-21 | Discharge: 2022-02-21 | Disposition: A | Discharge status: Home / Self Care | Payer: Medicare Other | Source: Ambulatory Visit | Attending: Cardiovascular Disease | Admitting: Cardiovascular Disease

## 2022-02-21 ENCOUNTER — Ambulatory Visit: Payer: Medicare Other | Attending: Thoracic Surgery (Cardiothoracic Vascular Surgery) | Admitting: *Deleted

## 2022-02-21 DIAGNOSIS — Z7901 Long term (current) use of anticoagulants: Secondary | ICD-10-CM

## 2022-02-21 DIAGNOSIS — Z951 Presence of aortocoronary bypass graft: Secondary | ICD-10-CM

## 2022-02-21 DIAGNOSIS — I4891 Unspecified atrial fibrillation: Secondary | ICD-10-CM | POA: Diagnosis not present

## 2022-02-21 DIAGNOSIS — Z8679 Personal history of other diseases of the circulatory system: Secondary | ICD-10-CM | POA: Diagnosis not present

## 2022-02-21 DIAGNOSIS — I480 Paroxysmal atrial fibrillation: Secondary | ICD-10-CM | POA: Diagnosis not present

## 2022-02-21 DIAGNOSIS — Z9889 Other specified postprocedural states: Secondary | ICD-10-CM

## 2022-02-21 DIAGNOSIS — I059 Rheumatic mitral valve disease, unspecified: Secondary | ICD-10-CM | POA: Diagnosis not present

## 2022-02-21 LAB — POCT INR: INR: 2 (ref 2.0–3.0)

## 2022-02-21 NOTE — Patient Instructions (Addendum)
Description   Take 3.5 tablets today then start taking warfarin 3 tablets daily except 2.5 tablets each Sunday, Tuesday, and Thursday. Repeat INR in 2 weeks. Anticoagulation Clinic [phone]

## 2022-02-23 ENCOUNTER — Encounter (HOSPITAL_COMMUNITY)
Admission: RE | Admit: 2022-02-23 | Discharge: 2022-02-23 | Disposition: A | Discharge status: Home / Self Care | Payer: Medicare Other | Source: Ambulatory Visit | Attending: Cardiovascular Disease | Admitting: Cardiovascular Disease

## 2022-02-23 DIAGNOSIS — Z951 Presence of aortocoronary bypass graft: Secondary | ICD-10-CM

## 2022-02-23 DIAGNOSIS — Z9889 Other specified postprocedural states: Secondary | ICD-10-CM

## 2022-02-23 LAB — GLUCOSE, CAPILLARY: Glucose-Capillary: 86 mg/dL (ref 70–99)

## 2022-02-23 NOTE — Progress Notes (Signed)
Incomplete Session Note  Patient Details  Name: Jon [name] MRN: [id number] Date of Birth: [birth year] Referring Provider:   Flowsheet Row INTENSIVE CARDIAC REHAB ORIENT from 01/25/2022 in [facility] for Heart, Vascular, & [place]  Referring Provider [name], MD       [name] did not complete his rehab session. [name] reported that his blood sugars were running in the 80's and low 90's.

## 2022-02-25 ENCOUNTER — Encounter (HOSPITAL_COMMUNITY)
Admission: RE | Admit: 2022-02-25 | Discharge: 2022-02-25 | Disposition: A | Discharge status: Home / Self Care | Payer: Medicare Other | Source: Ambulatory Visit | Attending: Cardiovascular Disease | Admitting: Cardiovascular Disease

## 2022-02-25 DIAGNOSIS — Z9889 Other specified postprocedural states: Secondary | ICD-10-CM

## 2022-02-25 DIAGNOSIS — Z951 Presence of aortocoronary bypass graft: Secondary | ICD-10-CM | POA: Diagnosis not present

## 2022-02-28 ENCOUNTER — Encounter (HOSPITAL_COMMUNITY): Payer: Medicare Other

## 2022-03-01 NOTE — Progress Notes (Signed)
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 Swallow whole., Disp: 30 tablet, Rfl: 12   atorvastatin (LIPITOR) 80 MG tablet, Take 1 tablet (80 mg total) by mouth daily. (Patient taking differently: Take 40 mg by mouth daily.), Disp: 90 tablet, Rfl: 3   Cholecalciferol (VITAMIN D-3 PO), Take 1 capsule by mouth daily., Disp: , Rfl:    COVID-19 mRNA vaccine 2023-2024 (COMIRNATY) syringe, Inject into the muscle., Disp: 0.3 mL, Rfl: 0   EPINEPHrine 0.3 mg/0.3 mL IJ SOAJ injection, Inject 0.3 mg into the muscle as needed for anaphylaxis. (Patient not taking: Reported on 02/07/2022), Disp: 1 each, Rfl: 0   Multiple Vitamin (MULTIVITAMIN WITH MINERALS) TABS tablet, Take 1 tablet by mouth 3 (three) times a week., Disp: , Rfl:    Multiple Vitamins-Minerals (MULTIVITAMIN ADULTS 50+ PO), Take 1 tablet by mouth daily., Disp: , Rfl:    nateglinide (STARLIX) 120 MG tablet, Take 0.5 tablets (60 mg total) by mouth 3 (three) times daily with meals., Disp: 270 tablet, Rfl: 1   omeprazole (PRILOSEC) 40 MG capsule, TAKE 1 CAPSULE BY MOUTH DAILY, Disp: 90 capsule, Rfl: 3    spironolactone (ALDACTONE) 25 MG tablet, Take 1 tablet (25 mg total) by mouth daily. 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 pylori infection 2011   Heart murmur    History of radiation therapy 09/20/10 thru 10/29/10   gastroesophageal junction/gastric cardia   Hypertension    Neuromuscular disorder ([place])    Other and unspecified hyperlipidemia    Sleep apnea    On CPAP   Status post chemotherapy 09/22/10 thru 10/27/10   concurrent w/radiation   Unspecified sleep apnea     Tobacco Use: Social History   Tobacco Use  Smoking Status Former   Packs/day: 1.00   Years: 40.00   Total pack years: 40.00   Types: Cigarettes   Quit date: 01/31/2005   Years since  quitting: 17.0  Smokeless Tobacco Never    Labs: Review Flowsheet  More data exists      Latest Ref Rng & Units 09/02/2021 10/13/2021 10/14/2021 10/21/2021 10/22/2021  Labs for ITP Cardiac and Pulmonary Rehab  Cholestrol 0 - 200 mg/dL - - 135  - -  LDL (calc) 0 - 99 mg/dL - - 60  - -  HDL-C >40 mg/dL - - 42  - -  Trlycerides <150 mg/dL - - 164  - -  Hemoglobin A1c 4.6 - 6.5 % 7.1  - - - -  PH, Arterial 7.35 - 7.45 - 7.276  7.336  - 7.45  7.293  7.310  7.316  7.356  7.497  7.449  7.335   PCO2 arterial 32 - 48 mmHg - 48.3  39.4  - 35  38.4  38.7  36.0  37.8  33.9  37.7  52.6    Bicarbonate 20.0 - 28.0 mmol/L - 19.2  22.5  21.1  - 24.3  18.4  19.4  18.4  21.4  26.2  26.1  25.3  28.0   TCO2 22 - 32 mmol/L - '20  24  22  21  '$ - - '20  21  19  23  25  27  26  27  26  27  30  25  29   '$ Acid-base deficit 0.0 - 2.0 mmol/L - 6.0  5.0  4.0  - - 7.0  6.0  7.0  4.0  1.0   O2 Saturation % - 96  76  100  - 99.2  97  98  98  99  100  100  89  100     Capillary Blood Glucose: Lab Results  Component Value Date   GLUCAP 86 02/23/2022   GLUCAP 134 (H) 02/11/2022   GLUCAP 136 (H) 02/11/2022   GLUCAP 117 (H) 02/09/2022   GLUCAP 132 (H) 02/09/2022     Exercise Target Goals: Exercise Program Goal: Individual exercise prescription set using results from initial 6 min walk test and THRR while considering  patient's activity barriers and safety.   Exercise Prescription Goal: Initial exercise prescription builds to 30-45 minutes a day of aerobic activity, 2-3 days per week.  Home exercise guidelines will be given to patient during program as part of exercise prescription that the participant will acknowledge.  Activity Barriers & Risk Stratification:  Activity Barriers & Cardiac Risk Stratification - 01/25/22 1528       Activity Barriers & Cardiac Risk Stratification   Activity Barriers Deconditioning;Back Problems;Balance Concerns;Muscular Weakness    Cardiac Risk Stratification High             6 Minute Walk:  6 Minute Walk     Row Name 01/25/22 1419         6 Minute Walk   Phase Initial     Distance 1346 feet     Walk Time 6 minutes     # of Rest Breaks 0     MPH 2.55     METS 2.6     RPE 11     Perceived Dyspnea  0     VO2 Peak 9.01     Symptoms No     Resting HR 57 bpm     Resting BP 99/66     Resting Oxygen Saturation  98 %     Exercise Oxygen Saturation  during 6 min walk 100 %     Max Ex. HR 85 bpm  Max Ex. BP 137/90     2 Minute Post BP 123/84              Oxygen Initial Assessment:   Oxygen Re-Evaluation:   Oxygen Discharge  (Final Oxygen Re-Evaluation):   Initial Exercise Prescription:  Initial Exercise Prescription - 01/25/22 1500       Date of Initial Exercise RX and Referring Provider   Date 01/25/22    Referring Provider [name], MD    Expected Discharge Date 03/18/22      Recumbant Bike   Level 2    RPM 60    Watts 15    Minutes 15    METs 2.6      Arm Ergometer   Level 2    Watts 37    RPM 60    Minutes 15    METs 2.6      Prescription Details   Frequency (times per week) 3    Duration Progress to 30 minutes of continuous aerobic without signs/symptoms of physical distress      Intensity   THRR 40-80% of Max Heartrate 58-115    Ratings of Perceived Exertion 11-13    Perceived Dyspnea 0-4      Progression   Progression Continue progressive overload as per policy without signs/symptoms or physical distress.      Resistance Training   Training Prescription Yes    Weight 3 lbs    Reps 10-15             Perform Capillary Blood Glucose checks as needed.  Exercise Prescription Changes:   Exercise Prescription Changes     Row Name 02/09/22 1033 02/25/22 1026           Response to Exercise   Blood Pressure (Admit) 92/60 102/68      Blood Pressure (Exercise) 118/70 124/64      Blood Pressure (Exit) 105/68 102/60      Heart Rate (Admit) 66 bpm 70 bpm      Heart Rate (Exercise) 109 bpm 106 bpm      Heart Rate (Exit) 68 bpm 72 bpm      Rating of Perceived Exertion (Exercise) 10 12      Symptoms None None      Comments Off to a good start with exercise. --      Duration Continue with 30 min of aerobic exercise without signs/symptoms of physical distress. Continue with 30 min of aerobic exercise without signs/symptoms of physical distress.      Intensity THRR unchanged THRR unchanged        Progression   Progression Continue to progress workloads to maintain intensity without signs/symptoms of physical distress. Continue to progress workloads to maintain  intensity without signs/symptoms of physical distress.      Average METs 2.5 3        Resistance Training   Training Prescription No  relaxation day, no weights Yes      Weight -- 3 lbs      Reps -- 10-15      Time -- 10 Minutes        Interval Training   Interval Training No No        Recumbant Bike   Level 2 3      Minutes 15 15      METs 2.1 2.6        NuStep   Level 2 3      SPM 108 119      Minutes  15 15      METs 3 3.5               Exercise Comments:   Exercise Comments     Row Name 02/09/22 1126           Exercise Comments Patient tolerated first session of exercise well without symptoms.                Exercise Goals and Review:   Exercise Goals     Row Name 01/25/22 1530             Exercise Goals   Increase Physical Activity Yes       Intervention Provide advice, education, support and counseling about physical activity/exercise needs.;Develop an individualized exercise prescription for aerobic and resistive training based on initial evaluation findings, risk stratification, comorbidities and participant's personal goals.       Expected Outcomes Short Term: Attend rehab on a regular basis to increase amount of physical activity.;Long Term: Add in home exercise to make exercise part of routine and to increase amount of physical activity.;Long Term: Exercising regularly at least 3-5 days a week.       Increase Strength and Stamina Yes       Intervention Provide advice, education, support and counseling about physical activity/exercise needs.;Develop an individualized exercise prescription for aerobic and resistive training based on initial evaluation findings, risk stratification, comorbidities and participant's personal goals.       Expected Outcomes Short Term: Increase workloads from initial exercise prescription for resistance, speed, and METs.;Short Term: Perform resistance training exercises routinely during rehab and add in resistance  training at home;Long Term: Improve cardiorespiratory fitness, muscular endurance and strength as measured by increased METs and functional capacity (6MWT)       Able to understand and use rate of perceived exertion (RPE) scale Yes       Intervention Provide education and explanation on how to use RPE scale       Expected Outcomes Short Term: Able to use RPE daily in rehab to express subjective intensity level;Long Term:  Able to use RPE to guide intensity level when exercising independently       Knowledge and understanding of Target Heart Rate Range (THRR) Yes       Intervention Provide education and explanation of THRR including how the numbers were predicted and where they are located for reference       Expected Outcomes Long Term: Able to use THRR to govern intensity when exercising independently;Short Term: Able to use daily as guideline for intensity in rehab;Short Term: Able to state/look up THRR       Understanding of Exercise Prescription Yes       Intervention Provide education, explanation, and written materials on patient's individual exercise prescription       Expected Outcomes Short Term: Able to explain program exercise prescription;Long Term: Able to explain home exercise prescription to exercise independently                Exercise Goals Re-Evaluation :  Exercise Goals Re-Evaluation     Row Name 02/01/22 0820 02/09/22 1126           Exercise Goal Re-Evaluation   Exercise Goals Review -- Increase Physical Activity;Able to understand and use rate of perceived exertion (RPE) scale      Comments Patient unable to exercise due to low blood sugar. Will resume exercise on 02/07/22. Patient able to understand and use RPE scale appropriately,  Expected Outcomes Review goals up return to exercise. Progress workloads as tolerated to increase cardiorespiratory fitness.               Discharge Exercise Prescription (Final Exercise Prescription Changes):  Exercise  Prescription Changes - 02/25/22 1026       Response to Exercise   Blood Pressure (Admit) 102/68    Blood Pressure (Exercise) 124/64    Blood Pressure (Exit) 102/60    Heart Rate (Admit) 70 bpm    Heart Rate (Exercise) 106 bpm    Heart Rate (Exit) 72 bpm    Rating of Perceived Exertion (Exercise) 12    Symptoms None    Duration Continue with 30 min of aerobic exercise without signs/symptoms of physical distress.    Intensity THRR unchanged      Progression   Progression Continue to progress workloads to maintain intensity without signs/symptoms of physical distress.    Average METs 3      Resistance Training   Training Prescription Yes    Weight 3 lbs    Reps 10-15    Time 10 Minutes      Interval Training   Interval Training No      Recumbant Bike   Level 3    Minutes 15    METs 2.6      NuStep   Level 3    SPM 119    Minutes 15    METs 3.5             Nutrition:  Target Goals: Understanding of nutrition guidelines, daily intake of sodium '1500mg'$ , cholesterol '200mg'$ , calories 30% from fat and 7% or less from saturated fats, daily to have 5 or more servings of fruits and vegetables.  Biometrics:  Pre Biometrics - 01/25/22 1315       Pre Biometrics   Waist Circumference 40 inches    Hip Circumference 42.5 inches    Waist to Hip Ratio 0.94 %    Triceps Skinfold 11 mm    % Body Fat 26.4 %    Grip Strength 32 kg    Flexibility 17.5 in    Single Leg Stand 3.12 seconds              Nutrition Therapy Plan and Nutrition Goals:  Nutrition Therapy & Goals - 02/09/22 1325       Nutrition Therapy   Diet Heart Healthy Diet    Drug/Food Interactions Statins/Certain Fruits;Coumadin/Vit K      Personal Nutrition Goals   Nutrition Goal Patient to identify strategies for reducing cardiovascular risk by attending the Pritikin education and nutrition series weekly    Personal Goal #2 Patient to identify strategies for weight gain of 0.5-2.0# per week     Personal Goal #3 Patient to improve diet quality by using the Plate method as guide for meal planning to include lean protein/plant protein, fruits, vegetables, whole grains, lowfat dairy as part of balanced diet    Comments Bjorn reports motivation to gain up to 205#; he reports losing lean muscle mass during cancer treatment in 2012. He continues 1 protein shake daily (35g protein); discussed alternative strategies for weight gain including protein/nutrition supplements, eating frequency, and increasing calories from fat (nuts/seeds, nut butters, etc). He did recently stop metformin due to blood sugar lows. Alicia will continue to benefit from participation in intensive cardiac rehab for nutrition education, exercise,and lifestyle modification.      Intervention Plan   Intervention Prescribe, educate and counsel regarding individualized specific dietary  modifications aiming towards targeted core components such as weight, hypertension, lipid management, diabetes, heart failure and other comorbidities.;Nutrition handout(s) given to patient.    Expected Outcomes Short Term Goal: Understand basic principles of dietary content, such as calories, fat, sodium, cholesterol and nutrients.;Long Term Goal: Adherence to prescribed nutrition plan.             Nutrition Assessments:  Nutrition Assessments - 01/20/22 1624       Rate Your Plate Scores   Pre Score 59            MEDIFICTS Score Key: ?70 Need to make dietary changes  40-70 Heart Healthy Diet ? 40 Therapeutic Level Cholesterol Diet   Flowsheet Row INTENSIVE CARDIAC REHAB ORIENT from 01/20/2022 in [facility] for Heart, Vascular, & Lung Health  Picture Your Plate Total Score on Admission 59      Picture Your Plate Scores: <66 Unhealthy dietary pattern with much room for improvement. 41-50 Dietary pattern unlikely to meet recommendations for good health and room for improvement. 51-60 More healthful dietary  pattern, with some room for improvement.  >60 Healthy dietary pattern, although there may be some specific behaviors that could be improved.    Nutrition Goals Re-Evaluation:  Nutrition Goals Re-Evaluation     [place] Name 02/09/22 1325             Goals   Current Weight 194 lb 3.6 oz (88.1 kg)       Comment A1c 7.1, triglycerides 164       Expected Outcome Delbert reports motivation to gain up to 205#; he reports losing lean muscle mass during cancer treatment in 2012. He continues 1 protein shake daily (35g protein); discussed alternative strategies for weight gain including protein/nutrition supplements, eating frequency, and increasing calories from fat (nuts/seeds, nut butters, etc). He eats three regular meals and multiple snacks throughout the day. He did recently stop metformin due to blood sugar lows. Kayl will continue to benefit from participation in intensive cardiac rehab for nutrition education, exercise,and lifestyle modification.                Nutrition Goals Re-Evaluation:  Nutrition Goals Re-Evaluation     [place] Name 02/09/22 1325             Goals   Current Weight 194 lb 3.6 oz (88.1 kg)       Comment A1c 7.1, triglycerides 164       Expected Outcome Stancil reports motivation to gain up to 205#; he reports losing lean muscle mass during cancer treatment in 2012. He continues 1 protein shake daily (35g protein); discussed alternative strategies for weight gain including protein/nutrition supplements, eating frequency, and increasing calories from fat (nuts/seeds, nut butters, etc). He eats three regular meals and multiple snacks throughout the day. He did recently stop metformin due to blood sugar lows. Josealfredo will continue to benefit from participation in intensive cardiac rehab for nutrition education, exercise,and lifestyle modification.                Nutrition Goals Discharge (Final Nutrition Goals Re-Evaluation):  Nutrition Goals Re-Evaluation - 02/09/22  1325       Goals   Current Weight 194 lb 3.6 oz (88.1 kg)    Comment A1c 7.1, triglycerides 164    Expected Outcome Timotheus reports motivation to gain up to 205#; he reports losing lean muscle mass during cancer treatment in 2012. He continues 1 protein shake daily (35g protein); discussed alternative strategies for  weight gain including protein/nutrition supplements, eating frequency, and increasing calories from fat (nuts/seeds, nut butters, etc). He eats three regular meals and multiple snacks throughout the day. He did recently stop metformin due to blood sugar lows. Jackston will continue to benefit from participation in intensive cardiac rehab for nutrition education, exercise,and lifestyle modification.             Psychosocial: Target Goals: Acknowledge presence or absence of significant depression and/or stress, maximize coping skills, provide positive support system. Participant is able to verbalize types and ability to use techniques and skills needed for reducing stress and depression.  Initial Review & Psychosocial Screening:  Initial Psych Review & Screening - 01/21/22 0840       Initial Review   Current issues with None Identified      Family Dynamics   Good Support System? Yes   Lynford lives alone he has a brother, sister, friends and neighbors for support     Barriers   Psychosocial barriers to participate in program There are no identifiable barriers or psychosocial needs.      Screening Interventions   Interventions Encouraged to exercise             Quality of Life Scores:  Quality of Life - 01/20/22 1457       Quality of Life   Select Quality of Life      Quality of Life Scores   Health/Function Pre 28.37 %    Socioeconomic Pre 27.36 %    Psych/Spiritual Pre 28.29 %    Family Pre 26.4 %    GLOBAL Pre 27.85 %            Scores of 19 and below usually indicate a poorer quality of life in these areas.  A difference of  2-3 points is a clinically  meaningful difference.  A difference of 2-3 points in the total score of the Quality of Life Index has been associated with significant improvement in overall quality of life, self-image, physical symptoms, and general health in studies assessing change in quality of life.  PHQ-9: Review Flowsheet  More data exists      01/21/2022 01/20/2022 12/29/2021 09/10/2021 08/31/2021  Depression screen PHQ 2/9  Decreased Interest 0 0 0 0 0  Down, Depressed, Hopeless 0 0 0 0 0  PHQ - 2 Score 0 0 0 0 0  Altered sleeping - - - 0 -  Tired, decreased energy - - - 0 -  Change in appetite - - - 0 -  Feeling bad or failure about yourself  - - - 0 -  Trouble concentrating - - - 0 -  Moving slowly or fidgety/restless - - - 0 -  Suicidal thoughts - - - 0 -  PHQ-9 Score - - - 0 -   Interpretation of Total Score  Total Score Depression Severity:  1-4 = Minimal depression, 5-9 = Mild depression, 10-14 = Moderate depression, 15-19 = Moderately severe depression, 20-27 = Severe depression   Psychosocial Evaluation and Intervention:   Psychosocial Re-Evaluation:  Psychosocial Re-Evaluation     Row Name 03/01/22 1556             Psychosocial Re-Evaluation   Current issues with None Identified       Interventions Encouraged to attend Cardiac Rehabilitation for the exercise       Continue Psychosocial Services  No Follow up required                Psychosocial  Discharge (Final Psychosocial Re-Evaluation):  Psychosocial Re-Evaluation - 03/01/22 1556       Psychosocial Re-Evaluation   Current issues with None Identified    Interventions Encouraged to attend Cardiac Rehabilitation for the exercise    Continue Psychosocial Services  No Follow up required             Vocational Rehabilitation: Provide vocational rehab assistance to qualifying candidates.   Vocational Rehab Evaluation & Intervention:  Vocational Rehab - 01/21/22 3419       Initial Vocational Rehab Evaluation &  Intervention   Assessment shows need for Vocational Rehabilitation No   Alexa is retired and does not need vocational rehab at this time            Education: Education Goals: Education classes will be provided on a weekly basis, covering required topics. Participant will state understanding/return demonstration of topics presented.    Education     Row Name 02/09/22 1300     Education   Cardiac Education Topics [employer]   Educator Dietitian   Weekly Topic Personalizing Your Pritikin Plate   Instruction Review Code 1- Verbalizes Understanding   Class Start Time 1145   Class Stop Time 1227   Class Time Calculation (min) 42 min    [place] Name 02/11/22 1200     Education   Cardiac Education Topics Pritikin   Charity fundraiser Exercise Physiologist   Select Psychosocial   Psychosocial Healthy Minds, Bodies, Hearts   Instruction Review Code 1- Verbalizes Understanding   Class Start Time 1140   Class Stop Time 1216   Class Time Calculation (min) 36 min    [place] Name 02/14/22 1200     Education   Cardiac Education Topics Pritikin   IT sales professional Nutrition   Nutrition Workshop Label Reading   Instruction Review Code 1- Verbalizes Understanding   Class Start Time 1145   Class Stop Time 1230   Class Time Calculation (min) 45 min    [place] Name 02/16/22 1300     Education   Cardiac Education Topics [employer]   Educator Dietitian   Weekly Topic [employer] Desserts   Instruction Review Code 1- Verbalizes Understanding   Class Start Time 1138   Class Stop Time 1225   Class Time Calculation (min) 47 min    [place] Name 02/18/22 1200     Education   Cardiac Education Topics [employer]ritikin   Scientist, research (life sciences)   Educator Dietitian   Select Nutrition   Nutrition Other  Label Reading   Instruction  Review Code 1- Verbalizes Understanding   Class Start Time 1140   Class Stop Time 1225   Class Time Calculation (min) 45 min    [place] Name 02/21/22 [place]   Select Workshops     Workshops   Educator Exercise Physiologist   Select Psychosocial   Psychosocial Workshop Other  Focused goals and sustainable changes   Instruction Review Code 1- Verbalizes Understanding   Class Start Time 1138   Class Stop Time 1220   Class Time Calculation (min) 42 min    [place] Name 02/25/22 1300     Education   Cardiac Education Topics  Pritikin   Charity fundraiser Dietitian   Nutrition Calorie Density   Instruction Review Code 1- Verbalizes Understanding   Class Start Time 1142   Class Stop Time 1223   Class Time Calculation (min) 41 min            Core Videos: Exercise    Move It!  Clinical staff conducted group or individual video education with verbal and written material and guidebook.  Patient learns the recommended Pritikin exercise program. Exercise with the goal of living a long, healthy life. Some of the health benefits of exercise include controlled diabetes, healthier blood pressure levels, improved cholesterol levels, improved heart and lung capacity, improved sleep, and better body composition. Everyone should speak with their doctor before starting or changing an exercise routine.  Biomechanical Limitations Clinical staff conducted group or individual video education with verbal and written material and guidebook.  Patient learns how biomechanical limitations can impact exercise and how we can mitigate and possibly overcome limitations to have an impactful and balanced exercise routine.  Body Composition Clinical staff conducted group or individual video education with verbal and written material and guidebook.  Patient learns that body composition (ratio of muscle mass to fat mass) is a key component  to assessing overall fitness, rather than body weight alone. Increased fat mass, especially visceral belly fat, can put [place] at increased risk for metabolic syndrome, type 2 diabetes, heart disease, and even death. It is recommended to combine diet and exercise (cardiovascular and resistance training) to improve your body composition. Seek guidance from your physician and exercise physiologist before implementing an exercise routine.  Exercise Action Plan Clinical staff conducted group or individual video education with verbal and written material and guidebook.  Patient learns the recommended strategies to achieve and enjoy long-term exercise adherence, including variety, self-motivation, self-efficacy, and positive decision making. Benefits of exercise include fitness, good health, weight management, more energy, better sleep, less stress, and overall well-being.  Medical   Heart Disease Risk Reduction Clinical staff conducted group or individual video education with verbal and written material and guidebook.  Patient learns our heart is our most vital organ as it circulates oxygen, nutrients, white blood cells, and hormones throughout the entire body, and carries waste away. Data supports a plant-based eating plan like the Pritikin Program for its effectiveness in slowing progression of and reversing heart disease. The video provides a number of recommendations to address heart disease.   Metabolic Syndrome and Belly Fat  Clinical staff conducted group or individual video education with verbal and written material and guidebook.  Patient learns what metabolic syndrome is, how it leads to heart disease, and how one can reverse it and keep it from coming back. You have metabolic syndrome if you have 3 of the following 5 criteria: abdominal obesity, high blood pressure, high triglycerides, low HDL cholesterol, and high blood sugar.  Hypertension and Heart Disease Clinical staff conducted group or  individual video education with verbal and written material and guidebook.  Patient learns that high blood pressure, or hypertension, is very common in the [place]. Hypertension is largely due to excessive salt intake, but other important risk factors include being overweight, physical inactivity, drinking too much alcohol, smoking, and not eating enough potassium from fruits and vegetables. High blood pressure is a leading risk factor for heart attack, stroke, congestive heart failure, dementia, kidney failure, and premature death. Long-term effects of excessive salt intake include stiffening  of the arteries and thickening of heart muscle and organ damage. Recommendations include ways to reduce hypertension and the risk of heart disease.  Diseases of Our Time - Focusing on Diabetes Clinical staff conducted group or individual video education with verbal and written material and guidebook.  Patient learns why the best way to stop diseases of our time is prevention, through food and other lifestyle changes. Medicine (such as prescription pills and surgeries) is often only a Band-Aid on the problem, not a long-term solution. Most common diseases of our time include obesity, type 2 diabetes, hypertension, heart disease, and cancer. The Pritikin Program is recommended and has been proven to help reduce, reverse, and/or prevent the damaging effects of metabolic syndrome.  Nutrition   Overview of the Pritikin Eating Plan  Clinical staff conducted group or individual video education with verbal and written material and guidebook.  Patient learns about the [place] for disease risk reduction. The [place] emphasizes a wide variety of unrefined, minimally-processed carbohydrates, like fruits, vegetables, whole grains, and legumes. Go, Caution, and Stop food choices are explained. Plant-based and lean animal proteins are emphasized. Rationale provided for low sodium intake for blood  pressure control, low added sugars for blood sugar stabilization, and low added fats and oils for coronary artery disease risk reduction and weight management.  Calorie Density  Clinical staff conducted group or individual video education with verbal and written material and guidebook.  Patient learns about calorie density and how it impacts the Pritikin Eating Plan. Knowing the characteristics of the food you choose will help you decide whether those foods will lead to weight gain or weight loss, and whether you want to consume more or less of them. Weight loss is usually a side effect of the Pritikin Eating Plan because of its focus on low calorie-dense foods.  Label Reading  Clinical staff conducted group or individual video education with verbal and written material and guidebook.  Patient learns about the Pritikin recommended label reading guidelines and corresponding recommendations regarding calorie density, added sugars, sodium content, and whole grains.  Dining Out - Part 1  Clinical staff conducted group or individual video education with verbal and written material and guidebook.  Patient learns that restaurant meals can be sabotaging because they can be so high in calories, fat, sodium, and/or sugar. Patient learns recommended strategies on how to positively address this and avoid unhealthy pitfalls.  Facts on Fats  Clinical staff conducted group or individual video education with verbal and written material and guidebook.  Patient learns that lifestyle modifications can be just as effective, if not more so, as many medications for lowering your risk of heart disease. A Pritikin lifestyle can help to reduce your risk of inflammation and atherosclerosis (cholesterol build-up, or plaque, in the artery walls). Lifestyle interventions such as dietary choices and physical activity address the cause of atherosclerosis. A review of the types of fats and their impact on blood cholesterol levels,  along with dietary recommendations to reduce fat intake is also included.  Nutrition Action Plan  Clinical staff conducted group or individual video education with verbal and written material and guidebook.  Patient learns how to incorporate Pritikin recommendations into their lifestyle. Recommendations include planning and keeping personal health goals in mind as an important part of their success.  Healthy Mind-Set    Healthy Minds, Bodies, Hearts  Clinical staff conducted group or individual video education with verbal and written material and guidebook.  Patient learns how to  identify when they are stressed. Video will discuss the impact of that stress, as well as the many benefits of stress management. Patient will also be introduced to stress management techniques. The way we think, act, and feel has an impact on our hearts.  How Our Thoughts Can Heal Our Hearts  Clinical staff conducted group or individual video education with verbal and written material and guidebook.  Patient learns that negative thoughts can cause depression and anxiety. This can result in negative lifestyle behavior and serious health problems. Cognitive behavioral therapy is an effective method to help control our thoughts in order to change and improve our emotional outlook.  Additional Videos:  Exercise    Improving Performance  Clinical staff conducted group or individual video education with verbal and written material and guidebook.  Patient learns to use a non-linear approach by alternating intensity levels and lengths of time spent exercising to help burn more calories and lose more body fat. Cardiovascular exercise helps improve heart health, metabolism, hormonal balance, blood sugar control, and recovery from fatigue. Resistance training improves strength, endurance, balance, coordination, reaction time, metabolism, and muscle mass. Flexibility exercise improves circulation, posture, and balance. Seek  guidance from your physician and exercise physiologist before implementing an exercise routine and learn your capabilities and proper form for all exercise.  Introduction to Yoga  Clinical staff conducted group or individual video education with verbal and written material and guidebook.  Patient learns about yoga, a discipline of the coming together of mind, breath, and body. The benefits of yoga include improved flexibility, improved range of motion, better posture and core strength, increased lung function, weight loss, and positive self-image. Yoga's heart health benefits include lowered blood pressure, healthier heart rate, decreased cholesterol and triglyceride levels, improved immune function, and reduced stress. Seek guidance from your physician and exercise physiologist before implementing an exercise routine and learn your capabilities and proper form for all exercise.  Medical   Aging: Enhancing Your Quality of Life  Clinical staff conducted group or individual video education with verbal and written material and guidebook.  Patient learns key strategies and recommendations to stay in good physical health and enhance quality of life, such as prevention strategies, having an advocate, securing a [place], and keeping a list of medications and system for tracking them. It also discusses how to avoid risk for bone loss.  Biology of Weight Control  Clinical staff conducted group or individual video education with verbal and written material and guidebook.  Patient learns that weight gain occurs because we consume more calories than we burn (eating more, moving less). Even if your body weight is normal, you may have higher ratios of fat compared to muscle mass. Too much body fat puts you at increased risk for cardiovascular disease, heart attack, stroke, type 2 diabetes, and obesity-related cancers. In addition to exercise, following the [place] can help  reduce your risk.  Decoding Lab Results  Clinical staff conducted group or individual video education with verbal and written material and guidebook.  Patient learns that lab test reflects one measurement whose values change over time and are influenced by many factors, including medication, stress, sleep, exercise, food, hydration, pre-existing medical conditions, and more. It is recommended to use the knowledge from this video to become more involved with your lab results and evaluate your numbers to speak with your doctor.   Diseases of Our Time - Overview  Clinical staff conducted group or individual video  education with verbal and written material and guidebook.  Patient learns that according to the CDC, 50% to 70% of chronic diseases (such as obesity, type 2 diabetes, elevated lipids, hypertension, and heart disease) are avoidable through lifestyle improvements including healthier food choices, listening to satiety cues, and increased physical activity.  Sleep Disorders Clinical staff conducted group or individual video education with verbal and written material and guidebook.  Patient learns how good quality and duration of sleep are important to overall health and well-being. Patient also learns about sleep disorders and how they impact health along with recommendations to address them, including discussing with a physician.  Nutrition  Dining Out - Part 2 Clinical staff conducted group or individual video education with verbal and written material and guidebook.  Patient learns how to plan ahead and communicate in order to maximize their dining experience in a healthy and nutritious manner. Included are recommended food choices based on the type of restaurant the patient is visiting.   Fueling a Best boy conducted group or individual video education with verbal and written material and guidebook.  There is a strong connection between our food choices and our health.  Diseases like obesity and type 2 diabetes are very prevalent and are in large-part due to lifestyle choices. The Pritikin Eating Plan provides plenty of food and hunger-curbing satisfaction. It is easy to follow, affordable, and helps reduce health risks.  Menu Workshop  Clinical staff conducted group or individual video education with verbal and written material and guidebook.  Patient learns that restaurant meals can sabotage health goals because they are often packed with calories, fat, sodium, and sugar. Recommendations include strategies to plan ahead and to communicate with the manager, chef, or server to help order a healthier meal.  Planning Your Eating Strategy  Clinical staff conducted group or individual video education with verbal and written material and guidebook.  Patient learns about the [place] and its benefit of reducing the risk of disease. The [place] does not focus on calories. Instead, it emphasizes high-quality, nutrient-rich foods. By knowing the characteristics of the foods, we choose, we can determine their calorie density and make informed decisions.  Targeting Your Nutrition Priorities  Clinical staff conducted group or individual video education with verbal and written material and guidebook.  Patient learns that lifestyle habits have a tremendous impact on disease risk and progression. This video provides eating and physical activity recommendations based on your personal health goals, such as reducing LDL cholesterol, losing weight, preventing or controlling type 2 diabetes, and reducing high blood pressure.  Vitamins and Minerals  Clinical staff conducted group or individual video education with verbal and written material and guidebook.  Patient learns different ways to obtain key vitamins and minerals, including through a recommended healthy diet. It is important to discuss all supplements you take with your doctor.   Healthy Mind-Set     Smoking Cessation  Clinical staff conducted group or individual video education with verbal and written material and guidebook.  Patient learns that cigarette smoking and tobacco addiction pose a serious health risk which affects millions of people. Stopping smoking will significantly reduce the risk of heart disease, lung disease, and many forms of cancer. Recommended strategies for quitting are covered, including working with your doctor to develop a successful plan.  Culinary   Becoming a Financial trader conducted group or individual video education with verbal and written material and guidebook.  Patient learns that  cooking at home can be healthy, cost-effective, quick, and puts them in control. Keys to cooking healthy recipes will include looking at your recipe, assessing your equipment needs, planning ahead, making it simple, choosing cost-effective seasonal ingredients, and limiting the use of added fats, salts, and sugars.  Cooking - Breakfast and Snacks  Clinical staff conducted group or individual video education with verbal and written material and guidebook.  Patient learns how important breakfast is to satiety and nutrition through the entire day. Recommendations include key foods to eat during breakfast to help stabilize blood sugar levels and to prevent overeating at meals later in the day. Planning ahead is also a key component.  Cooking - Human resources officer conducted group or individual video education with verbal and written material and guidebook.  Patient learns eating strategies to improve overall health, including an approach to cook more at home. Recommendations include thinking of animal protein as a side on your plate rather than center stage and focusing instead on lower calorie dense options like vegetables, fruits, whole grains, and plant-based proteins, such as beans. Making sauces in large quantities to freeze for later and leaving the skin  on your vegetables are also recommended to maximize your experience.  Cooking - Healthy Salads and Dressing Clinical staff conducted group or individual video education with verbal and written material and guidebook.  Patient learns that vegetables, fruits, whole grains, and legumes are the foundations of the [place]. Recommendations include how to incorporate each of these in flavorful and healthy salads, and how to create homemade salad dressings. Proper handling of ingredients is also covered. Cooking - Soups and [employer] - Soups and Desserts Clinical staff conducted group or individual video education with verbal and written material and guidebook.  Patient learns that Pritikin soups and desserts make for easy, nutritious, and delicious snacks and meal components that are low in sodium, fat, sugar, and calorie density, while high in vitamins, minerals, and filling fiber. Recommendations include simple and healthy ideas for soups and desserts.   Overview     The Pritikin Solution Program Overview Clinical staff conducted group or individual video education with verbal and written material and guidebook.  Patient learns that the results of the [place] Program have been documented in more than 100 articles published in peer-reviewed journals, and the benefits include reducing risk factors for (and, in some cases, even reversing) high cholesterol, high blood pressure, type 2 diabetes, obesity, and more! An overview of the three key pillars of the Pritikin Program will be covered: eating well, doing regular exercise, and having a healthy mind-set.  WORKSHOPS  Exercise: Exercise Basics: Building Your Action Plan Clinical staff led group instruction and group discussion with PowerPoint presentation and patient guidebook. To enhance the learning environment the use of posters, models and videos may be added. At the conclusion of this workshop, patients will comprehend the  difference between physical activity and exercise, as well as the benefits of incorporating both, into their routine. Patients will understand the FITT (Frequency, Intensity, Time, and Type) principle and how to use it to build an exercise action plan. In addition, safety concerns and other considerations for exercise and cardiac rehab will be addressed by the presenter. The purpose of this lesson is to promote a comprehensive and effective weekly exercise routine in order to improve patients' overall level of fitness.   Managing Heart Disease: Your Path to a Healthier Heart Clinical staff led group instruction and group discussion  with PowerPoint presentation and patient guidebook. To enhance the learning environment the use of posters, models and videos may be added.At the conclusion of this workshop, patients will understand the anatomy and physiology of the heart. Additionally, they will understand how Pritikin's three pillars impact the risk factors, the progression, and the management of heart disease.  The purpose of this lesson is to provide a high-level overview of the heart, heart disease, and how the Pritikin lifestyle positively impacts risk factors.  Exercise Biomechanics Clinical staff led group instruction and group discussion with PowerPoint presentation and patient guidebook. To enhance the learning environment the use of posters, models and videos may be added. Patients will learn how the structural parts of their bodies function and how these functions impact their daily activities, movement, and exercise. Patients will learn how to promote a neutral spine, learn how to manage pain, and identify ways to improve their physical movement in order to promote healthy living. The purpose of this lesson is to expose patients to common physical limitations that impact physical activity. Participants will learn practical ways to adapt and manage aches and pains, and to minimize their  effect on regular exercise. Patients will learn how to maintain good posture while sitting, walking, and lifting.  Balance Training and Fall Prevention  Clinical staff led group instruction and group discussion with PowerPoint presentation and patient guidebook. To enhance the learning environment the use of posters, models and videos may be added. At the conclusion of this workshop, patients will understand the importance of their sensorimotor skills (vision, proprioception, and the vestibular system) in maintaining their ability to balance as they age. Patients will apply a variety of balancing exercises that are appropriate for their current level of function. Patients will understand the common causes for poor balance, possible solutions to these problems, and ways to modify their physical environment in order to minimize their fall risk. The purpose of this lesson is to teach patients about the importance of maintaining balance as they age and ways to minimize their risk of falling.  WORKSHOPS   Nutrition:  Fueling a Scientist, research (physical sciences) led group instruction and group discussion with PowerPoint presentation and patient guidebook. To enhance the learning environment the use of posters, models and videos may be added. Patients will review the foundational principles of the [place] and understand what constitutes a serving size in each of the food groups. Patients will also learn Pritikin-friendly foods that are better choices when away from home and review make-ahead meal and snack options. Calorie density will be reviewed and applied to three nutrition priorities: weight maintenance, weight loss, and weight gain. The purpose of this lesson is to reinforce (in a group setting) the key concepts around what patients are recommended to eat and how to apply these guidelines when away from home by planning and selecting Pritikin-friendly options. Patients will understand how calorie  density may be adjusted for different weight management goals.  Mindful Eating  Clinical staff led group instruction and group discussion with PowerPoint presentation and patient guidebook. To enhance the learning environment the use of posters, models and videos may be added. Patients will briefly review the concepts of the [place] and the importance of low-calorie dense foods. The concept of mindful eating will be introduced as well as the importance of paying attention to internal hunger signals. Triggers for non-hunger eating and techniques for dealing with triggers will be explored. The purpose of this lesson is to provide patients  with the opportunity to review the basic principles of the [place], discuss the value of eating mindfully and how to measure internal cues of hunger and fullness using the Hunger Scale. Patients will also discuss reasons for non-hunger eating and learn strategies to use for controlling emotional eating.  Targeting Your Nutrition Priorities Clinical staff led group instruction and group discussion with PowerPoint presentation and patient guidebook. To enhance the learning environment the use of posters, models and videos may be added. Patients will learn how to determine their genetic susceptibility to disease by reviewing their family history. Patients will gain insight into the importance of diet as part of an overall healthy lifestyle in mitigating the impact of genetics and other environmental insults. The purpose of this lesson is to provide patients with the opportunity to assess their personal nutrition priorities by looking at their family history, their own health history and current risk factors. Patients will also be able to discuss ways of prioritizing and modifying the [place] for their highest risk areas  Menu  Clinical staff led group instruction and group discussion with PowerPoint presentation and patient guidebook. To  enhance the learning environment the use of posters, models and videos may be added. Using menus brought in from [employer], or printed from [employer], patients will apply the [place] dining out guidelines that were presented in the [employer] video. Patients will also be able to practice these guidelines in a variety of provided scenarios. The purpose of this lesson is to provide patients with the opportunity to practice hands-on learning of the [place] with actual menus and practice scenarios.  Label Reading Clinical staff led group instruction and group discussion with PowerPoint presentation and patient guidebook. To enhance the learning environment the use of posters, models and videos may be added. Patients will review and discuss the Pritikin label reading guidelines presented in [employer]ritikin's Label Reading Educational series video. Using fool labels brought in from local grocery stores and markets, patients will apply the label reading guidelines and determine if the packaged food meet the Pritikin guidelines. The purpose of this lesson is to provide patients with the opportunity to review, discuss, and practice hands-on learning of the Pritikin Label Reading guidelines with actual packaged food labels. [facility] Workshops are designed to teach patients ways to prepare quick, simple, and affordable recipes at home. The importance of nutrition's role in chronic disease risk reduction is reflected in its emphasis in the overall Pritikin program. By learning how to prepare essential core Pritikin Eating Plan recipes, patients will increase control over what they eat; be able to customize the flavor of foods without the use of added salt, sugar, or fat; and improve the quality of the food they consume. By learning a set of core recipes which are easily assembled, quickly prepared, and affordable, patients are more likely to  prepare more healthy foods at home. These workshops focus on convenient breakfasts, simple entres, side dishes, and desserts which can be prepared with minimal effort and are consistent with nutrition recommendations for cardiovascular risk reduction. Cooking [employer] are taught by a Engineer, materials (RD) who has been trained by the [employer]. The chef or RD has a clear understanding of the importance of minimizing - if not completely eliminating - added fat, sugar, and sodium in recipes. Throughout the series of [facility] Workshop sessions, patients will learn about healthy ingredients and efficient  methods of cooking to build confidence in their capability to prepare    Cooking School weekly topics:  Adding Flavor- Sodium-Free  Fast and Healthy Breakfasts  Powerhouse Plant-Based Proteins  Satisfying Salads and Dressings  Simple Sides and Sauces  International Cuisine-Spotlight on the [place] Zones  Delicious Desserts  Savory Soups  [employer] - Meals in a Snap  Tasty Appetizers and Snacks  Comforting Weekend Breakfasts  One-Pot Wonders   Fast Evening Meals  Easy Entertaining  Personalizing Your Pritikin Plate  WORKSHOPS   Healthy Mindset (Psychosocial): New Thoughts, New Behaviors Clinical staff led group instruction and group discussion with PowerPoint presentation and patient guidebook. To enhance the learning environment the use of posters, models and videos may be added. Patients will learn and practice techniques for developing effective health and lifestyle goals. Patients will be able to effectively apply the goal setting process learned to develop at least one new personal goal.  The purpose of this lesson is to expose patients to a new skill set of behavior modification techniques such as techniques setting SMART goals, overcoming barriers, and achieving new thoughts and new behaviors.  Managing Moods and Relationships Clinical  staff led group instruction and group discussion with PowerPoint presentation and patient guidebook. To enhance the learning environment the use of posters, models and videos may be added. Patients will learn how emotional and chronic stress factors can impact their health and relationships. They will learn healthy ways to manage their moods and utilize positive coping mechanisms. In addition, ICR patients will learn ways to improve communication skills. The purpose of this lesson is to expose patients to ways of understanding how one's mood and health are intimately connected. Developing a healthy outlook can help build positive relationships and connections with others. Patients will understand the importance of utilizing effective communication skills that include actively listening and being heard. They will learn and understand the importance of the "4 Cs" and especially Connections in fostering of a Healthy Mind-Set.  Healthy Sleep for a Healthy Heart Clinical staff led group instruction and group discussion with PowerPoint presentation and patient guidebook. To enhance the learning environment the use of posters, models and videos may be added. At the conclusion of this workshop, patients will be able to demonstrate knowledge of the importance of sleep to overall health, well-being, and quality of life. They will understand the symptoms of, and treatments for, common sleep disorders. Patients will also be able to identify daytime and nighttime behaviors which impact sleep, and they will be able to apply these tools to help manage sleep-related challenges. The purpose of this lesson is to provide patients with a general overview of sleep and outline the importance of quality sleep. Patients will learn about a few of the most common sleep disorders. Patients will also be introduced to the concept of "sleep hygiene," and discover ways to self-manage certain sleeping problems through simple daily behavior  changes. Finally, the workshop will motivate patients by clarifying the links between quality sleep and their goals of heart-healthy living.   Recognizing and Reducing Stress Clinical staff led group instruction and group discussion with PowerPoint presentation and patient guidebook. To enhance the learning environment the use of posters, models and videos may be added. At the conclusion of this workshop, patients will be able to understand the types of stress reactions, differentiate between acute and chronic stress, and recognize the impact that chronic stress has on their health. They will also be able to apply different coping mechanisms, such  as reframing negative self-talk. Patients will have the opportunity to practice a variety of stress management techniques, such as deep abdominal breathing, progressive muscle relaxation, and/or guided imagery.  The purpose of this lesson is to educate patients on the role of stress in their lives and to provide healthy techniques for coping with it.  Learning Barriers/Preferences:  Learning Barriers/Preferences - 01/20/22 1547       Learning Barriers/Preferences   Learning Barriers Sight   wears glasses   Learning Preferences Audio;Skilled Demonstration;Computer/Internet;Verbal Instruction;Group Instruction;Video;Individual Instruction;Written Material;Pictoral             Education Topics:  Knowledge Questionnaire Score:  Knowledge Questionnaire Score - 01/20/22 1458       Knowledge Questionnaire Score   Pre Score 19/24             Core Components/Risk Factors/Patient Goals at Admission:  Personal Goals and Risk Factors at Admission - 01/20/22 1547       Core Components/Risk Factors/Patient Goals on Admission    Weight Management Yes;Weight Maintenance    Intervention Weight Management: Develop a combined nutrition and exercise program designed to reach desired caloric intake, while maintaining appropriate intake of nutrient and  fiber, sodium and fats, and appropriate energy expenditure required for the weight goal.;Weight Management: Provide education and appropriate resources to help participant work on and attain dietary goals.    Expected Outcomes Weight Maintenance: Understanding of the daily nutrition guidelines, which includes 25-35% calories from fat, 7% or less cal from saturated fats, less than '200mg'$  cholesterol, less than 1.5gm of sodium, & 5 or more servings of fruits and vegetables daily;Weight Loss: Understanding of general recommendations for a balanced deficit meal plan, which promotes 1-2 lb weight loss per week and includes a negative energy balance of [phone] kcal/d    Diabetes Yes    Intervention Provide education about signs/symptoms and action to take for hypo/hyperglycemia.;Provide education about proper nutrition, including hydration, and aerobic/resistive exercise prescription along with prescribed medications to achieve blood glucose in normal ranges: Fasting glucose 65-99 mg/dL    Expected Outcomes Short Term: Participant verbalizes understanding of the signs/symptoms and immediate care of hyper/hypoglycemia, proper foot care and importance of medication, aerobic/resistive exercise and nutrition plan for blood glucose control.;Long Term: Attainment of HbA1C < 7%.    Hypertension Yes    Intervention Provide education on lifestyle modifcations including regular physical activity/exercise, weight management, moderate sodium restriction and increased consumption of fresh fruit, vegetables, and low fat dairy, alcohol moderation, and smoking cessation.;Monitor prescription use compliance.    Expected Outcomes Short Term: Continued assessment and intervention until BP is < 140/57m HG in hypertensive participants. < 130/827mHG in hypertensive participants with diabetes, heart failure or chronic kidney disease.;Long Term: Maintenance of blood pressure at goal levels.    Lipids Yes    Intervention Provide  education and support for participant on nutrition & aerobic/resistive exercise along with prescribed medications to achieve LDL '70mg'$ , HDL >'40mg'$ .    Expected Outcomes Short Term: Participant states understanding of desired cholesterol values and is compliant with medications prescribed. Participant is following exercise prescription and nutrition guidelines.;Long Term: Cholesterol controlled with medications as prescribed, with individualized exercise RX and with personalized nutrition plan. Value goals: LDL < '70mg'$ , HDL > 40 mg.             Core Components/Risk Factors/Patient Goals Review:   Goals and Risk Factor Review     Row Name 03/01/22 1556  Core Components/Risk Factors/Patient Goals Review   Personal Goals Review Weight Management/Obesity;Hypertension;Diabetes;Lipids       Review Adaiah has been doing well with exercise. Vital signs have been stable. CBg's have been variable. Diabetic meds have been decreased by Dr Alain Marion       Expected Outcomes Alamin will continue to participate in intensive cardiac rehab for exercise, nutrition and lifestyle modifications                Core Components/Risk Factors/Patient Goals at Discharge (Final Review):   Goals and Risk Factor Review - 03/01/22 1556       Core Components/Risk Factors/Patient Goals Review   Personal Goals Review Weight Management/Obesity;Hypertension;Diabetes;Lipids    Review Ziair has been doing well with exercise. Vital signs have been stable. CBg's have been variable. Diabetic meds have been decreased by Dr Alain Marion    Expected Outcomes Lawerance will continue to participate in intensive cardiac rehab for exercise, nutrition and lifestyle modifications             ITP Comments:  ITP Comments     Row Name 01/21/22 4403 02/01/22 0851 03/01/22 1554       ITP Comments Introduction to Pritikin Education Program/ Intensive Cardiac Rehab. Initial Orientatin Packet Reviewed with the patient 30 Day  ITP review. Rainer's cardiac rehab is currently on hold until aftter he sees Dr Alain Marion on 02/04/22 as his CBG's have been low. Spyros should start intensive cardiac rehab on 02/07/22. 30 Day ITP review. Oniel has good attendance and participation in intensive cardiac rehab when in attendance              Comments: See ITP Comments

## 2022-03-02 ENCOUNTER — Encounter (HOSPITAL_COMMUNITY)
Admission: RE | Admit: 2022-03-02 | Discharge: 2022-03-02 | Disposition: A | Discharge status: Home / Self Care | Payer: Medicare Other | Source: Ambulatory Visit | Attending: Cardiovascular Disease | Admitting: Cardiovascular Disease

## 2022-03-02 DIAGNOSIS — Z9889 Other specified postprocedural states: Secondary | ICD-10-CM

## 2022-03-02 DIAGNOSIS — Z951 Presence of aortocoronary bypass graft: Secondary | ICD-10-CM

## 2022-03-02 NOTE — Progress Notes (Signed)
Reviewed home exercise guidelines with patient including endpoints, temperature precautions, target heart rate and rate of perceived exertion. Patient is currently riding his stationary bike 15-30 minutes 4/days/week, walking daily, and using his resistance bands as his mode of home exercise. Patient voices understanding of instructions given.  Sol Passer, MS, ACSM CEP

## 2022-03-04 ENCOUNTER — Encounter (HOSPITAL_COMMUNITY): Payer: Medicare Other

## 2022-03-04 DIAGNOSIS — G4733 Obstructive sleep apnea (adult) (pediatric): Secondary | ICD-10-CM | POA: Diagnosis not present

## 2022-03-07 ENCOUNTER — Other Ambulatory Visit: Payer: Self-pay | Admitting: Physician Assistant

## 2022-03-07 ENCOUNTER — Ambulatory Visit: Payer: Medicare Other | Attending: Cardiology | Admitting: *Deleted

## 2022-03-07 ENCOUNTER — Encounter (HOSPITAL_COMMUNITY)
Admission: RE | Admit: 2022-03-07 | Discharge: 2022-03-07 | Disposition: A | Discharge status: Home / Self Care | Payer: Medicare Other | Source: Ambulatory Visit | Attending: Cardiovascular Disease | Admitting: Cardiovascular Disease

## 2022-03-07 DIAGNOSIS — Z951 Presence of aortocoronary bypass graft: Secondary | ICD-10-CM | POA: Insufficient documentation

## 2022-03-07 DIAGNOSIS — I059 Rheumatic mitral valve disease, unspecified: Secondary | ICD-10-CM | POA: Diagnosis not present

## 2022-03-07 DIAGNOSIS — Z7901 Long term (current) use of anticoagulants: Secondary | ICD-10-CM | POA: Diagnosis not present

## 2022-03-07 DIAGNOSIS — I4891 Unspecified atrial fibrillation: Secondary | ICD-10-CM

## 2022-03-07 DIAGNOSIS — Z9889 Other specified postprocedural states: Secondary | ICD-10-CM

## 2022-03-07 DIAGNOSIS — Z48812 Encounter for surgical aftercare following surgery on the circulatory system: Secondary | ICD-10-CM | POA: Diagnosis not present

## 2022-03-07 DIAGNOSIS — I480 Paroxysmal atrial fibrillation: Secondary | ICD-10-CM

## 2022-03-07 DIAGNOSIS — Z8679 Personal history of other diseases of the circulatory system: Secondary | ICD-10-CM | POA: Diagnosis not present

## 2022-03-07 LAB — POCT INR: INR: 2.8 (ref 2.0–3.0)

## 2022-03-07 MED ORDER — WARFARIN SODIUM 2.5 MG PO TABS: ORAL_TABLET | ORAL | 1 refills | Status: DC

## 2022-03-07 NOTE — Patient Instructions (Signed)
Description   Continue taking warfarin 3 tablets daily except 2.5 tablets each Sunday, Tuesday, and Thursday. Repeat INR in 3 weeks. Anticoagulation Clinic [phone]

## 2022-03-09 ENCOUNTER — Encounter (HOSPITAL_COMMUNITY)
Admission: RE | Admit: 2022-03-09 | Discharge: 2022-03-09 | Disposition: A | Discharge status: Home / Self Care | Payer: Medicare Other | Source: Ambulatory Visit | Attending: Cardiovascular Disease | Admitting: Cardiovascular Disease

## 2022-03-09 DIAGNOSIS — Z9889 Other specified postprocedural states: Secondary | ICD-10-CM

## 2022-03-09 DIAGNOSIS — Z951 Presence of aortocoronary bypass graft: Secondary | ICD-10-CM

## 2022-03-09 DIAGNOSIS — Z48812 Encounter for surgical aftercare following surgery on the circulatory system: Secondary | ICD-10-CM | POA: Diagnosis not present

## 2022-03-11 ENCOUNTER — Encounter (HOSPITAL_COMMUNITY)
Admission: RE | Admit: 2022-03-11 | Discharge: 2022-03-11 | Disposition: A | Discharge status: Home / Self Care | Payer: Medicare Other | Source: Ambulatory Visit | Attending: Cardiovascular Disease | Admitting: Cardiovascular Disease

## 2022-03-11 DIAGNOSIS — Z951 Presence of aortocoronary bypass graft: Secondary | ICD-10-CM

## 2022-03-11 DIAGNOSIS — Z48812 Encounter for surgical aftercare following surgery on the circulatory system: Secondary | ICD-10-CM | POA: Diagnosis not present

## 2022-03-11 DIAGNOSIS — Z9889 Other specified postprocedural states: Secondary | ICD-10-CM

## 2022-03-14 ENCOUNTER — Encounter (HOSPITAL_COMMUNITY)
Admission: RE | Admit: 2022-03-14 | Discharge: 2022-03-14 | Disposition: A | Discharge status: Home / Self Care | Payer: Medicare Other | Source: Ambulatory Visit | Attending: Cardiovascular Disease | Admitting: Cardiovascular Disease

## 2022-03-14 DIAGNOSIS — Z951 Presence of aortocoronary bypass graft: Secondary | ICD-10-CM | POA: Diagnosis not present

## 2022-03-14 DIAGNOSIS — Z9889 Other specified postprocedural states: Secondary | ICD-10-CM

## 2022-03-14 DIAGNOSIS — Z48812 Encounter for surgical aftercare following surgery on the circulatory system: Secondary | ICD-10-CM | POA: Diagnosis not present

## 2022-03-16 ENCOUNTER — Encounter (HOSPITAL_COMMUNITY)
Admission: RE | Admit: 2022-03-16 | Discharge: 2022-03-16 | Disposition: A | Discharge status: Home / Self Care | Payer: Medicare Other | Source: Ambulatory Visit | Attending: Cardiovascular Disease | Admitting: Cardiovascular Disease

## 2022-03-16 DIAGNOSIS — Z951 Presence of aortocoronary bypass graft: Secondary | ICD-10-CM

## 2022-03-16 DIAGNOSIS — Z9889 Other specified postprocedural states: Secondary | ICD-10-CM | POA: Diagnosis not present

## 2022-03-16 DIAGNOSIS — Z48812 Encounter for surgical aftercare following surgery on the circulatory system: Secondary | ICD-10-CM | POA: Diagnosis not present

## 2022-03-17 ENCOUNTER — Encounter (HOSPITAL_BASED_OUTPATIENT_CLINIC_OR_DEPARTMENT_OTHER): Payer: Self-pay | Admitting: Pulmonary Disease

## 2022-03-17 ENCOUNTER — Ambulatory Visit (INDEPENDENT_AMBULATORY_CARE_PROVIDER_SITE_OTHER): Payer: Medicare Other | Admitting: Pulmonary Disease

## 2022-03-17 VITALS — BP 102/60 | HR 63 | Ht 70.0 in | Wt 196.0 lb

## 2022-03-17 DIAGNOSIS — G4733 Obstructive sleep apnea (adult) (pediatric): Secondary | ICD-10-CM | POA: Diagnosis not present

## 2022-03-17 NOTE — Progress Notes (Signed)
   Subjective:    Patient ID: Jon [name], male    DOB: 1945-07-23, 77 y.o.   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  He has lost 12 pounds to his current weight of 195 and feels like he has lost muscle.  He remains very compliant with his CPAP machine and denies any problems with his nasal mask or pressure.  He is settled down with nasal pillows He reports dryness of mouth    Significant tests/ events reviewed NPSG 2007:  AHI 74/hr    Review of Systems neg for any significant sore throat, dysphagia, itching, sneezing, nasal congestion or excess/ purulent secretions, fever, chills, sweats, unintended wt loss, pleuritic or exertional cp, hempoptysis, orthopnea pnd or change in chronic leg swelling. Also denies presyncope, palpitations, heartburn, abdominal pain, nausea, vomiting, diarrhea or change in bowel or urinary habits, dysuria,hematuria, rash, arthralgias, visual complaints, headache, numbness weakness or ataxia.     Objective:   Physical Exam  Gen. Pleasant, well-nourished, in no distress ENT - no thrush, no pallor/icterus,no post nasal drip Neck: No JVD, no thyromegaly, no carotid bruits Lungs: no use of accessory muscles, no dullness to percussion, clear without rales or rhonchi  Cardiovascular: Rhythm regular, heart sounds  normal, no murmurs or gallops, no peripheral edema Musculoskeletal: No deformities, no cyanosis or clubbing        Assessment & Plan:

## 2022-03-17 NOTE — Assessment & Plan Note (Signed)
CPAP download was reviewed which shows good control of events on auto settings 8 to 15 cm with average pressure of 13 maximum pressure of 14 cm.  He is very compliant he has a moderate leak but this does not seem to bother him too much.  CPAP is only helped improve his daytime somnolence and fatigue  compliance with goal of at least 4-6 hrs every night is the expectation. Advised against medications with sedative side effects Cautioned against driving when sleepy - understanding that sleepiness will vary on a day to day basis

## 2022-03-17 NOTE — Progress Notes (Signed)
Office Visit    Patient Name: Jon [name] Date of Encounter: 03/18/2022  Primary Care Provider:  [name], MD Primary Cardiologist:  None Primary Electrophysiologist: None  Chief Complaint    Jon [name] is a 77 y.o. male with PMH of PAF, HTN, DM type II, GERD, sleep apnea (on CPAP), tobacco abuse, CAD s/p CABG x3 with mitral valve replacement who presents today for 3 month  visit of recent CABG.   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 pylori infection 2011   Heart murmur    History of radiation therapy 09/20/10 thru 10/29/10   gastroesophageal junction/gastric cardia   Hypertension    Neuromuscular disorder ([place])    Other and unspecified hyperlipidemia    Sleep apnea    On CPAP   Status post chemotherapy 09/22/10 thru 10/27/10   concurrent w/radiation   Unspecified sleep apnea    Past Surgical History:  Procedure Laterality Date   CATARACT EXTRACTION     bilateral   COLONOSCOPY     CORONARY ARTERY BYPASS GRAFT N/A 10/22/2021   Procedure: CORONARY ARTERY BYPASS GRAFTING (CABG) X THREE, USING LEFT INTERNAL MAMMARY ARTERY AND RIGHT LEG GREATER SAPHENOUS VEIN HARVESTED ENDOSCOPICALLY;  Surgeon: Coralie Common, MD;  Location: [facility];  Service: Open Heart Surgery;  Laterality: N/A;   CORONARY BALLOON ANGIOPLASTY N/A 10/13/2021   Procedure: CORONARY BALLOON ANGIOPLASTY;  Surgeon: Sherren Mocha, MD;  Location: [facility];  Service: Cardiovascular;  Laterality: N/A;   CORONARY BALLOON ANGIOPLASTY N/A 10/15/2021   Procedure: CORONARY BALLOON ANGIOPLASTY;  Surgeon: [name], MD;  Location: [facility];  Service: Cardiovascular;  Laterality: N/A;   ELBOW BURSA SURGERY     LEFT HEART CATH AND CORONARY ANGIOGRAPHY N/A 10/13/2021   Procedure: LEFT HEART CATH AND CORONARY ANGIOGRAPHY;  Surgeon: Sherren Mocha, MD;  Location: [facility];  Service: Cardiovascular;  Laterality: N/A;   LEFT HEART CATH AND CORONARY ANGIOGRAPHY N/A 10/15/2021   Procedure: LEFT HEART CATH AND CORONARY ANGIOGRAPHY;  Surgeon: [name], MD;  Location: [facility];  Service: Cardiovascular;  Laterality: N/A;   LEG SURGERY  1980   right leg    MAZE N/A 10/22/2021   Procedure: MAZE;  Surgeon: Coralie Common, MD;  Location: [facility];  Service: Open Heart Surgery;  Laterality: N/A;   MITRAL VALVE REPAIR N/A 10/22/2021   Procedure: MITRAL VALVE REPAIR (MVR) USING MCCARTHY-ADAMS RING SIZE 30MM;  Surgeon: Coralie Common, MD;  Location: [facility];  Service: Open Heart Surgery;  Laterality: N/A;   SKIN BIOPSY  08/22/11   shave biopsy =benign   TEE WITHOUT CARDIOVERSION N/A 10/22/2021   Procedure: TRANSESOPHAGEAL ECHOCARDIOGRAM (TEE);  Surgeon: Coralie Common, MD;  Location: [facility];  Service: Open Heart Surgery;  Laterality: N/A;   TONSILLECTOMY      Allergies  Allergies  Allergen Reactions   Fenofibrate     Weakness, pain    History of Present Illness   Jon [name]  is a 77 year old male with the above mention past medical  history who presents today for [facility] visit of recent CABG and MVR repair.  [name] was seen by Dr. Harrington Challenger in 2011 for management of atrial fibrillation and chest pressure.  He was lost to follow-up until 10/2021 when he was seen in the ED for complaint of substernal burning chest discomfort.  He was found to have AF with slow RVR and initial troponins were negative.  Patient signed out AMA and while walking the triage she is severe dizziness episode with diaphoresis and shortness of breath.  He was transported back to room and troponins trended up to 1100.  EKG completed  which showed ST elevation in inferior leads with reciprocal ST depression in V1-V3.  Patient had LHC performed that showed occluded posterior lateral branch and severe mid LAD lesion.  Patient had unsuccessful PCI to RPL V and LAD and was arranged for CABG.  2D echo was performed showing EF of 60-65%, no RWMA with inferior lateral severe hypokinesis and grade 2 DD with the regurgitation.  Patient underwent CABG x3 on 10/22/2021 with postop complicated by junctional rhythm requiring temporary pacing in the 50s with AF with RVR that required amiodarone infusion.  Due to persistent arrhythmias, EP consult was obtained.  They did not feel PPM placement was indicated.  The patient was found to be in NSR with some mild intraventricular delay.  Patient also has some bradycardia and nodal blocking agents were advised to not be given.  Patient was discharged with amiodarone and creatinine was stable.  He was seen by his PCP and was given prescription for torsemide due to lower extremity edema.  He was seen in follow-up on 11/12/2021 and denied any complaints of dizziness or bleeding with Coumadin.  He was walking in his driveway and was referred to cardiac rehab.  Blood pressure was stable at 96/62 with a rate of 69 bpm.  He was seen by Dr. Lavonna Monarch on 10/30 and was granted clearance for driving and lifting 10 pounds.  He will require ASA and Plavix for 1 year and if maintains sinus rhythm can discontinue Coumadin.  He was seen for 1 month follow-up 12/10/2021 and patient reported doing well with no complaints of palpitations or chest pain.  He was maintaining good fluid intake but did note some swelling that improved with as needed torsemide 10 mg twice daily.  He was cleared to start cardiac rehab and I encouraged him to use compression stockings elevate feet for lower extremity edema.   Since last being seen in the office patient reports that he is doing well with no new cardiac complaints.  He is currently participating  in cardiac rehab and reports no cardiac limitations or shortness of breath.  He is using the recumbent bike and participating with stretches and is currently enjoying the class information.  His blood pressure today is well-controlled at 100/64 and he is compliant with his current medication regimen.  He is euvolemic on exam today and is using torsemide as needed.  Patient denies chest pain, palpitations, dyspnea, PND, orthopnea, nausea, vomiting, dizziness, syncope, edema, weight gain, or early satiety.  Home Medications    Current Outpatient Medications  Medication Sig Dispense Refill   acetaminophen (TYLENOL) 500 MG tablet Take 500-1,000 mg by mouth daily as needed for headache or mild pain.     aspirin EC 81 MG tablet Take 1 tablet (81 mg total) by mouth daily. Swallow whole. 30 tablet 12   atorvastatin (LIPITOR) 80 MG tablet Take 1 tablet (80 mg total)  by mouth daily. 90 tablet 3   Cholecalciferol (VITAMIN D-3 PO) Take 1 capsule by mouth daily.     COVID-19 mRNA vaccine 2023-2024 (COMIRNATY) syringe Inject into the muscle. 0.3 mL 0   EPINEPHrine 0.3 mg/0.3 mL IJ SOAJ injection Inject 0.3 mg into the muscle as needed for anaphylaxis. 1 each 0   Multiple Vitamin (MULTIVITAMIN WITH MINERALS) TABS tablet Take 1 tablet by mouth 3 (three) times a week.     Multiple Vitamins-Minerals (MULTIVITAMIN ADULTS 50+ PO) Take 1 tablet by mouth daily.     nateglinide (STARLIX) 120 MG tablet Take 0.5 tablets (60 mg total) by mouth 3 (three) times daily with meals. 270 tablet 1   omeprazole (PRILOSEC) 40 MG capsule TAKE 1 CAPSULE BY MOUTH DAILY 90 capsule 3   spironolactone (ALDACTONE) 25 MG tablet Take 1 tablet (25 mg total) by mouth daily. (Patient taking differently: Take 12.5 mg by mouth daily.) 30 tablet 3   torsemide (DEMADEX) 10 MG tablet Take 1 tablet (10 mg total) by mouth daily. 30 tablet 3   warfarin (COUMADIN) 2.5 MG tablet TAKE 2.5 TABLETS TO 3 TABLETS BY MOUTH DAILY OR AS DIRECTED BY COUMADIN  CLINIC. 90 tablet 1   No current facility-administered medications for this visit.     Review of Systems  Please see the history of present illness.      All other systems reviewed and are otherwise negative except as noted above.  Physical Exam    Wt Readings from Last 3 Encounters:  03/18/22 192 lb 3.2 oz (87.2 kg)  03/17/22 195 lb 15.8 oz (88.9 kg)  02/07/22 190 lb 12.8 oz (86.5 kg)   VS: Vitals:   03/18/22 1009  BP: 100/64  Pulse: (!) 58  SpO2: 95%  ,Body mass index is 27.58 kg/m.  Constitutional:      Appearance: Healthy appearance. Not in distress.  Neck:     Vascular: JVD normal.  Pulmonary:     Effort: Pulmonary effort is normal.     Breath sounds: No wheezing. No rales. Diminished in the bases Cardiovascular:     Normal rate. Regular rhythm. Normal S1. Normal S2.      Murmurs: There is no murmur.  Edema:    Peripheral edema absent.  Abdominal:     Palpations: Abdomen is soft non tender. There is no hepatomegaly.  Skin:    General: Skin is warm and dry.  Neurological:     General: No focal deficit present.     Mental Status: Alert and oriented to person, place and time.     Cranial Nerves: Cranial nerves are intact.  EKG/LABS/Other Studies Reviewed    ECG personally reviewed by me today -sinus bradycardia with first-degree AVB and incomplete RBBB with rate of 59 bpm and no acute changes consistent with previous EKG.  Risk Assessment/Calculations:    CHA2DS2-VASc Score = 4   This indicates a 4.8% annual risk of stroke. The patient's score is based upon: CHF History: 0 HTN History: 1 Diabetes History: 1 Stroke History: 0 Vascular Disease History: 0 Age Score: 2 Gender Score: 0           Lab Results  Component Value Date   WBC 11.2 (H) 11/01/2021   HGB 8.2 (L) 11/01/2021   HCT 25.1 (L) 11/01/2021   MCV 87.5 11/01/2021   PLT 652 (H) 11/01/2021   Lab Results  Component Value Date   CREATININE 1.28 (H) 11/02/2021   BUN 14 11/02/2021    NA 138  11/02/2021   K 3.8 11/02/2021   CL 107 11/02/2021   CO2 23 11/02/2021   Lab Results  Component Value Date   ALT 29 10/21/2021   AST 30 10/21/2021   ALKPHOS 49 10/21/2021   BILITOT 0.8 10/21/2021   Lab Results  Component Value Date   CHOL 135 10/14/2021   HDL 42 10/14/2021   LDLCALC 60 10/14/2021   LDLDIRECT 30.0 11/10/2020   TRIG 164 (H) 10/14/2021   CHOLHDL 3.2 10/14/2021    Lab Results  Component Value Date   HGBA1C 7.1 (H) 09/02/2021    Assessment & Plan    1.  Coronary artery disease: -s/p CABG x3 with mitral valve replacement -Today patient reports he is feeling much better and has no complaints of anginal chest pain or shortness of breath -Continue current GDMT with ASA 81 mg, atorvastatin 80 mg, -He is tolerating cardiac rehab with no cardiac complaints at this time.   2.  Mitral valve repair -2D echo completed with EF noted at 55% and no evidence of mitral stenosis with mitral valve peak gradient of 9 mmHg and mean gradient of 4 mmHg -SBE prophylaxis discussed for dental procedures -Continue Coumadin per Coumadin clinic with INR goal of 2-3   3.  Paroxysmal atrial fibrillation -Avoid nodal blocking agents due to bradycardia -Today patient is sinus rhythm rhythm with RBBB and rate of 69 bpm -Continue warfarin per Coumadin clinic -Continue amiodarone 200 mg daily CHA2DS2-VASc Score = 4 [CHF History: 0, HTN History: 1, Diabetes History: 1, Stroke History: 0, Vascular Disease History: 0, Age Score: 2, Gender Score: 0].  Therefore, the patient's annual risk of stroke is 4.8 %.       4.  Aortic stenosis: -Mild aortic stenosis noted by most recent 2D echo -Continue fluid volume management and blood pressure control   5.  Lower extremity edema: -Patient's lower extremity edema has resolved and is currently taking torsemide as needed.   Disposition: Follow-up with None or APP in 6 months    Medication Adjustments/Labs and Tests Ordered: Current  medicines are reviewed at length with the patient today.  Concerns regarding medicines are outlined above.   Signed, Mable Fill, Marissa Nestle, NP 03/18/2022, 11:09 AM [facility]  Note:  This document was prepared using Dragon voice recognition software and may include unintentional dictation errors.

## 2022-03-17 NOTE — Patient Instructions (Signed)
CPAP supplies will be renewed x 1 year 

## 2022-03-18 ENCOUNTER — Ambulatory Visit: Payer: Medicare Other | Attending: Nurse Practitioner | Admitting: Nurse Practitioner

## 2022-03-18 ENCOUNTER — Encounter: Payer: Self-pay | Admitting: Nurse Practitioner

## 2022-03-18 VITALS — BP 100/64 | HR 58 | Ht 70.0 in | Wt 192.2 lb

## 2022-03-18 DIAGNOSIS — Z9889 Other specified postprocedural states: Secondary | ICD-10-CM | POA: Diagnosis not present

## 2022-03-18 DIAGNOSIS — I480 Paroxysmal atrial fibrillation: Secondary | ICD-10-CM

## 2022-03-18 DIAGNOSIS — I350 Nonrheumatic aortic (valve) stenosis: Secondary | ICD-10-CM | POA: Diagnosis not present

## 2022-03-18 DIAGNOSIS — R600 Localized edema: Secondary | ICD-10-CM

## 2022-03-18 DIAGNOSIS — I059 Rheumatic mitral valve disease, unspecified: Secondary | ICD-10-CM

## 2022-03-18 NOTE — Patient Instructions (Addendum)
Medication Instructions:  Your physician recommends that you continue on your current medications as directed. Please refer to the Current Medication list given to you today. *If you need a refill on your cardiac medications before your next appointment, please call your pharmacy*   Lab Work: None Ordered   Testing/Procedures: None ordered    Follow-Up: At [facility], you and your health needs are our priority.  As part of our continuing mission to provide you with exceptional heart care, we have created designated Provider Care Teams.  These Care Teams include your primary Cardiologist (physician) and Advanced Practice Providers (APPs -  Physician Assistants and Nurse Practitioners) who all work together to provide you with the care you need, when you need it.  We recommend signing up for the patient portal called "MyChart".  Sign up information is provided on this After Visit Summary.  MyChart is used to connect with patients for Virtual Visits (Telemedicine).  Patients are able to view lab/test results, encounter notes, upcoming appointments, etc.  Non-urgent messages can be sent to your provider as well.   To learn more about what you can do with MyChart, go to [email or web address].    Your next appointment:   6 month(s)  Provider:   Dorris Carnes  Other Instructions

## 2022-03-21 ENCOUNTER — Encounter (HOSPITAL_COMMUNITY)
Admission: RE | Admit: 2022-03-21 | Discharge: 2022-03-21 | Disposition: A | Discharge status: Home / Self Care | Payer: Medicare Other | Source: Ambulatory Visit | Attending: Cardiovascular Disease | Admitting: Cardiovascular Disease

## 2022-03-21 DIAGNOSIS — Z9889 Other specified postprocedural states: Secondary | ICD-10-CM | POA: Diagnosis not present

## 2022-03-21 DIAGNOSIS — Z951 Presence of aortocoronary bypass graft: Secondary | ICD-10-CM | POA: Diagnosis not present

## 2022-03-21 DIAGNOSIS — Z48812 Encounter for surgical aftercare following surgery on the circulatory system: Secondary | ICD-10-CM | POA: Diagnosis not present

## 2022-03-22 ENCOUNTER — Telehealth: Payer: Self-pay | Admitting: Internal Medicine

## 2022-03-22 MED ORDER — ATORVASTATIN CALCIUM 80 MG PO TABS: 80.0000 mg | ORAL_TABLET | Freq: Every day | ORAL | 3 refills | Status: DC

## 2022-03-22 NOTE — Telephone Encounter (Signed)
Caller & Relationship to patient:  self   Call back number:   Date of last office visit:  02/04/2022   Date of next office visit:   Medication(s) to be refilled:  Lipitor        Preferred Pharmacy:[facility] on [address]

## 2022-03-22 NOTE — Telephone Encounter (Signed)
Notified pt rx sent to [facility]  

## 2022-03-23 ENCOUNTER — Encounter (HOSPITAL_COMMUNITY)
Admission: RE | Admit: 2022-03-23 | Discharge: 2022-03-23 | Disposition: A | Discharge status: Home / Self Care | Payer: Medicare Other | Source: Ambulatory Visit | Attending: Cardiovascular Disease | Admitting: Cardiovascular Disease

## 2022-03-23 DIAGNOSIS — Z951 Presence of aortocoronary bypass graft: Secondary | ICD-10-CM | POA: Diagnosis not present

## 2022-03-23 DIAGNOSIS — Z9889 Other specified postprocedural states: Secondary | ICD-10-CM

## 2022-03-23 DIAGNOSIS — Z48812 Encounter for surgical aftercare following surgery on the circulatory system: Secondary | ICD-10-CM | POA: Diagnosis not present

## 2022-03-25 ENCOUNTER — Encounter (HOSPITAL_COMMUNITY): Payer: Medicare Other

## 2022-03-25 NOTE — Progress Notes (Signed)
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 Swallow whole., Disp: 30 tablet, Rfl: 12   atorvastatin (LIPITOR) 80 MG tablet, Take 1 tablet (80 mg total) by mouth daily., Disp: 90 tablet, Rfl: 3   Cholecalciferol (VITAMIN D-3 PO), Take 1 capsule by mouth daily., Disp: , Rfl:    COVID-19 mRNA vaccine 2023-2024 (COMIRNATY) syringe, Inject into the muscle., Disp: 0.3 mL, Rfl: 0   EPINEPHrine 0.3 mg/0.3 mL IJ SOAJ injection, Inject 0.3 mg into the muscle as needed for anaphylaxis., Disp: 1 each, Rfl: 0   Multiple Vitamin (MULTIVITAMIN WITH MINERALS) TABS tablet, Take 1 tablet by mouth 3 (three) times a week., Disp: , Rfl:    Multiple Vitamins-Minerals (MULTIVITAMIN ADULTS 50+ PO), Take 1 tablet by mouth daily., Disp: , Rfl:    nateglinide (STARLIX) 120 MG tablet, Take 0.5 tablets (60 mg total) by mouth 3 (three) times daily with meals., Disp: 270 tablet, Rfl: 1   omeprazole (PRILOSEC) 40 MG capsule, TAKE 1 CAPSULE BY MOUTH DAILY, Disp: 90 capsule, Rfl: 3   spironolactone (ALDACTONE) 25 MG tablet, Take 1 tablet (25 mg total) by mouth daily. 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 pylori infection 2011   Heart murmur    History of radiation therapy 09/20/10 thru 10/29/10   gastroesophageal junction/gastric cardia   Hypertension    Neuromuscular disorder ([place])    Other and unspecified hyperlipidemia    Sleep apnea    On CPAP   Status post chemotherapy 09/22/10 thru 10/27/10   concurrent w/radiation   Unspecified sleep apnea     Tobacco Use: Social History   Tobacco Use  Smoking Status Former   Packs/day: 1.00   Years: 40.00   Total pack years: 40.00   Types: Cigarettes   Quit date: 01/31/2005   Years since quitting: 17.1  Smokeless Tobacco Never    Labs: Review Flowsheet  More data exists      Latest Ref Rng & Units 09/02/2021 10/13/2021 10/14/2021 10/21/2021 10/22/2021  Labs for ITP Cardiac and Pulmonary Rehab  Cholestrol 0 - 200 mg/dL - - 135  - -  LDL (calc) 0 - 99 mg/dL - - 60  - -  HDL-C >40 mg/dL - - 42  - -  Trlycerides <150 mg/dL - - 164  - -  Hemoglobin A1c 4.6 - 6.5 % 7.1  - - - -  PH, Arterial 7.35 - 7.45 - 7.276  7.336  - 7.45  7.293  7.310  7.316  7.356  7.497  7.449  7.335   PCO2 arterial 32 - 48 mmHg - 48.3  39.4  - 35  38.4  38.7  36.0  37.8  33.9  37.7  52.6   Bicarbonate 20.0 - 28.0 mmol/L - 19.2  22.5  21.1  - 24.3  18.4  19.4  18.4  21.4  26.2   26.1  25.3  28.0   TCO2 22 - 32 mmol/L - '20  24  22  21  '$ - - '20  21  19  23  25  27  26  27  26  27  30  25  29   '$ Acid-base deficit 0.0 - 2.0 mmol/L - 6.0  5.0  4.0  - - 7.0  6.0  7.0  4.0  1.0   O2 Saturation % - 96  76  100  - 99.2  97  98  98  99  100  100  89  100     Capillary Blood Glucose: Lab Results  Component Value Date   GLUCAP 86 02/23/2022   GLUCAP 134 (H) 02/11/2022   GLUCAP 136 (H) 02/11/2022   GLUCAP 117 (H) 02/09/2022   GLUCAP 132 (H) 02/09/2022     Exercise Target Goals: Exercise Program Goal: Individual exercise prescription set using results from initial 6 min walk test and THRR while considering  patient's activity barriers and safety.   Exercise Prescription Goal: Initial exercise prescription builds to 30-45 minutes a day of aerobic activity, 2-3 days per week.  Home exercise guidelines will be given to patient during program as part of exercise prescription that the participant will acknowledge.  Activity Barriers & Risk Stratification:  Activity Barriers & Cardiac Risk Stratification - 01/25/22 1528       Activity Barriers & Cardiac Risk Stratification   Activity Barriers Deconditioning;Back Problems;Balance Concerns;Muscular Weakness    Cardiac Risk Stratification High             6 Minute Walk:  6 Minute Walk     Row Name 01/25/22 1419         6 Minute Walk   Phase Initial     Distance 1346 feet     Walk Time 6 minutes     # of Rest Breaks 0     MPH 2.55     METS 2.6     RPE 11     Perceived Dyspnea  0     VO2 Peak 9.01     Symptoms No     Resting HR 57 bpm     Resting BP 99/66     Resting Oxygen Saturation  98 %     Exercise Oxygen Saturation  during 6 min walk 100 %     Max Ex. HR 85 bpm     Max Ex. BP 137/90     2 Minute Post BP  123/84              Oxygen Initial Assessment:   Oxygen Re-Evaluation:   Oxygen Discharge (Final Oxygen Re-Evaluation):   Initial Exercise Prescription:  Initial Exercise  Prescription - 01/25/22 1500       Date of Initial Exercise RX and Referring Provider   Date 01/25/22    Referring Provider [name], MD    Expected Discharge Date 03/18/22      Recumbant Bike   Level 2    RPM 60    Watts 15    Minutes 15    METs 2.6      Arm Ergometer   Level 2    Watts 37    RPM 60    Minutes 15    METs 2.6      Prescription Details   Frequency (times per week) 3    Duration Progress to 30 minutes of continuous aerobic without signs/symptoms of physical distress      Intensity   THRR 40-80% of Max Heartrate 58-115    Ratings of Perceived Exertion 11-13    Perceived Dyspnea 0-4      Progression   Progression Continue progressive overload as per policy without signs/symptoms or physical distress.      Resistance Training   Training Prescription Yes    Weight 3 lbs    Reps 10-15             Perform Capillary Blood Glucose checks as needed.  Exercise Prescription Changes:   Exercise Prescription Changes     Row Name 02/09/22 1033 02/25/22 1026 03/11/22 1021 03/21/22 1026       Response to Exercise   Blood Pressure (Admit) 92/60 1'02/68 98/62 98/62 '$    Blood Pressure (Exercise) 118/70 124/64 140/68 122/62    Blood Pressure (Exit) 105/68 102/60 94/58 104/78    Heart Rate (Admit) 66 bpm 70 bpm 65 bpm 54 bpm    Heart Rate (Exercise) 109 bpm 106 bpm 119 bpm 108 bpm    Heart Rate (Exit) 68 bpm 72 bpm 74 bpm 63 bpm    Rating of Perceived Exertion (Exercise) '10 12 12 13    '$ Symptoms None None None None    Comments Off to a good start with exercise. -- -- --    Duration Continue with 30 min of aerobic exercise without signs/symptoms of physical distress. Continue with 30 min of aerobic exercise without signs/symptoms of physical distress. Continue with 30 min of aerobic exercise without signs/symptoms of physical distress. Continue with 30 min of aerobic exercise without signs/symptoms of physical distress.    Intensity THRR unchanged  THRR unchanged THRR unchanged THRR unchanged      Progression   Progression Continue to progress workloads to maintain intensity without signs/symptoms of physical distress. Continue to progress workloads to maintain intensity without signs/symptoms of physical distress. Continue to progress workloads to maintain intensity without signs/symptoms of physical distress. Continue to progress workloads to maintain intensity without signs/symptoms of physical distress.    Average METs 2.5 3 3.6 3.6      Resistance Training   Training Prescription No  relaxation day, no weights Yes Yes Yes    Weight -- 3 lbs 4 lbs 4 lbs    Reps -- 10-15 10-15 10-15    Time -- 10 Minutes 10 Minutes 10 Minutes      Interval Training   Interval Training No No No No      Recumbant Bike   Level 2  $'3 3 3    'd$ RPM -- -- 64 64    Watts -- -- 50 50    Minutes '15 15 15 15    '$ METs 2.1 2.6 3.1 3.1      NuStep   Level '2 3 3 3    '$ SPM 108 119 130 129    Minutes '15 15 15 15    '$ METs 3 3.5 4.1 4.1      Home Exercise Plan   Plans to continue exercise at -- -- Home (comment)  Stationary bike Home (comment)  Stationary bike    Frequency -- -- Add 4 additional days to program exercise sessions. Add 4 additional days to program exercise sessions.    Initial Home Exercises Provided -- -- 03/02/22 03/02/22             Exercise Comments:   Exercise Comments     Row Name 02/09/22 1126 03/02/22 1111 03/21/22 1103       Exercise Comments Patient tolerated first session of exercise well without symptoms. Reviewed home exercise guidelines, METs, and goals with patient. Reviewed METs and goals with patient.              Exercise Goals and Review:   Exercise Goals     Row Name 01/25/22 1530             Exercise Goals   Increase Physical Activity Yes       Intervention Provide advice, education, support and counseling about physical activity/exercise needs.;Develop an individualized exercise prescription for  aerobic and resistive training based on initial evaluation findings, risk stratification, comorbidities and participant's personal goals.       Expected Outcomes Short Term: Attend rehab on a regular basis to increase amount of physical activity.;Long Term: Add in home exercise to make exercise part of routine and to increase amount of physical activity.;Long Term: Exercising regularly at least 3-5 days a week.       Increase Strength and Stamina Yes       Intervention Provide advice, education, support and counseling about physical activity/exercise needs.;Develop an individualized exercise prescription for aerobic and resistive training based on initial evaluation findings, risk stratification, comorbidities and participant's personal goals.       Expected Outcomes Short Term: Increase workloads from initial exercise prescription for resistance, speed, and METs.;Short Term: Perform resistance training exercises routinely during rehab and add in resistance training at home;Long Term: Improve cardiorespiratory fitness, muscular endurance and strength as measured by increased METs and functional capacity (6MWT)       Able to understand and use rate of perceived exertion (RPE) scale Yes       Intervention Provide education and explanation on how to use RPE scale       Expected Outcomes Short Term: Able to use RPE daily in rehab to express subjective intensity level;Long Term:  Able to use RPE to guide intensity level when exercising independently       Knowledge and understanding of Target Heart Rate Range (THRR) Yes       Intervention Provide education and explanation of THRR including how the numbers were predicted and where they are located for reference       Expected Outcomes Long Term: Able to use THRR to govern intensity when exercising independently;Short Term: Able to use daily as guideline for intensity in rehab;Short Term: Able to state/look up THRR       Understanding of Exercise Prescription  Yes  Intervention Provide education, explanation, and written materials on patient's individual exercise prescription       Expected Outcomes Short Term: Able to explain program exercise prescription;Long Term: Able to explain home exercise prescription to exercise independently                Exercise Goals Re-Evaluation :  Exercise Goals Re-Evaluation     Row Name 02/01/22 0820 02/09/22 1126 03/02/22 1111 03/21/22 1103       Exercise Goal Re-Evaluation   Exercise Goals Review -- Increase Physical Activity;Able to understand and use rate of perceived exertion (RPE) scale Increase Physical Activity;Able to understand and use rate of perceived exertion (RPE) scale;Increase Strength and Stamina;Knowledge and understanding of Target Heart Rate Range (THRR);Understanding of Exercise Prescription Increase Physical Activity;Able to understand and use rate of perceived exertion (RPE) scale;Increase Strength and Stamina;Knowledge and understanding of Target Heart Rate Range (THRR);Understanding of Exercise Prescription    Comments Patient unable to exercise due to low blood sugar. Will resume exercise on 02/07/22. Patient able to understand and use RPE scale appropriately, Reviewed exercise prescription with patient. Patient is riding his stationary bike, walking, and using his resistance bands at home. Patient continue to make steady progress with exercise. Patient continues his home exercise routine riding his stationary bike 15 minutes, 4 days/week in addition to exercise at cardiac rehab. Patient states he's getting better.    Expected Outcomes Review goals up return to exercise. Progress workloads as tolerated to increase cardiorespiratory fitness. Continue daily exercise routine. Progress workloads and continue home exercise routine.             Discharge Exercise Prescription (Final Exercise Prescription Changes):  Exercise Prescription Changes - 03/21/22 1026       Response to  Exercise   Blood Pressure (Admit) 98/62    Blood Pressure (Exercise) 122/62    Blood Pressure (Exit) 104/78    Heart Rate (Admit) 54 bpm    Heart Rate (Exercise) 108 bpm    Heart Rate (Exit) 63 bpm    Rating of Perceived Exertion (Exercise) 13    Symptoms None    Duration Continue with 30 min of aerobic exercise without signs/symptoms of physical distress.    Intensity THRR unchanged      Progression   Progression Continue to progress workloads to maintain intensity without signs/symptoms of physical distress.    Average METs 3.6      Resistance Training   Training Prescription Yes    Weight 4 lbs    Reps 10-15    Time 10 Minutes      Interval Training   Interval Training No      Recumbant Bike   Level 3    RPM 64    Watts 50    Minutes 15    METs 3.1      NuStep   Level 3    SPM 129    Minutes 15    METs 4.1      Home Exercise Plan   Plans to continue exercise at Home (comment)   Stationary bike   Frequency Add 4 additional days to program exercise sessions.    Initial Home Exercises Provided 03/02/22             Nutrition:  Target Goals: Understanding of nutrition guidelines, daily intake of sodium '1500mg'$ , cholesterol '200mg'$ , calories 30% from fat and 7% or less from saturated fats, daily to have 5 or more servings of fruits and vegetables.  Biometrics:  Pre  Biometrics - 01/25/22 1315       Pre Biometrics   Waist Circumference 40 inches    Hip Circumference 42.5 inches    Waist to Hip Ratio 0.94 %    Triceps Skinfold 11 mm    % Body Fat 26.4 %    Grip Strength 32 kg    Flexibility 17.5 in    Single Leg Stand 3.12 seconds              Nutrition Therapy Plan and Nutrition Goals:  Nutrition Therapy & Goals - 02/09/22 1325       Nutrition Therapy   Diet Heart Healthy Diet    Drug/Food Interactions Statins/Certain Fruits;Coumadin/Vit K      Personal Nutrition Goals   Nutrition Goal Patient to identify strategies for reducing  cardiovascular risk by attending the [employer] education and nutrition series weekly    Personal Goal #2 Patient to identify strategies for weight gain of 0.5-2.0# per week    Personal Goal #3 Patient to improve diet quality by using the Plate method as guide for meal planning to include lean protein/plant protein, fruits, vegetables, whole grains, lowfat dairy as part of balanced diet    Comments Doan reports motivation to gain up to 205#; he reports losing lean muscle mass during cancer treatment in 2012. He continues 1 protein shake daily (35g protein); discussed alternative strategies for weight gain including protein/nutrition supplements, eating frequency, and increasing calories from fat (nuts/seeds, nut butters, etc). He did recently stop metformin due to blood sugar lows. Larkin will continue to benefit from participation in intensive cardiac rehab for nutrition education, exercise,and lifestyle modification.      Intervention Plan   Intervention Prescribe, educate and counsel regarding individualized specific dietary modifications aiming towards targeted core components such as weight, hypertension, lipid management, diabetes, heart failure and other comorbidities.;Nutrition handout(s) given to patient.    Expected Outcomes Short Term Goal: Understand basic principles of dietary content, such as calories, fat, sodium, cholesterol and nutrients.;Long Term Goal: Adherence to prescribed nutrition plan.             Nutrition Assessments:  Nutrition Assessments - 01/20/22 1624       Rate Your Plate Scores   Pre Score 59            MEDIFICTS Score Key: ?70 Need to make dietary changes  40-70 Heart Healthy Diet ? 40 Therapeutic Level Cholesterol Diet   Flowsheet Row INTENSIVE CARDIAC REHAB ORIENT from 01/20/2022 in [facility] for Heart, Vascular, & Lung Health  Picture Your Plate Total Score on Admission 59      Picture Your Plate Scores: [id number] Unhealthy  dietary pattern with much room for improvement. 41-50 Dietary pattern unlikely to meet recommendations for good health and room for improvement. 51-60 More healthful dietary pattern, with some room for improvement.  >60 Healthy dietary pattern, although there may be some specific behaviors that could be improved.    Nutrition Goals Re-Evaluation:  Nutrition Goals Re-Evaluation     [place] Name 02/09/22 1325             Goals   Current Weight 194 lb 3.6 oz (88.1 kg)       Comment A1c 7.1, triglycerides 164       Expected Outcome Shane reports motivation to gain up to 205#; he reports losing lean muscle mass during cancer treatment in 2012. He continues 1 protein shake daily (35g protein); discussed alternative strategies for weight gain including  protein/nutrition supplements, eating frequency, and increasing calories from fat (nuts/seeds, nut butters, etc). He eats three regular meals and multiple snacks throughout the day. He did recently stop metformin due to blood sugar lows. Cervando will continue to benefit from participation in intensive cardiac rehab for nutrition education, exercise,and lifestyle modification.                Nutrition Goals Re-Evaluation:  Nutrition Goals Re-Evaluation     [place] Name 02/09/22 1325             Goals   Current Weight 194 lb 3.6 oz (88.1 kg)       Comment A1c 7.1, triglycerides 164       Expected Outcome Shaarav reports motivation to gain up to 205#; he reports losing lean muscle mass during cancer treatment in 2012. He continues 1 protein shake daily (35g protein); discussed alternative strategies for weight gain including protein/nutrition supplements, eating frequency, and increasing calories from fat (nuts/seeds, nut butters, etc). He eats three regular meals and multiple snacks throughout the day. He did recently stop metformin due to blood sugar lows. Markavion will continue to benefit from participation in intensive cardiac rehab for nutrition  education, exercise,and lifestyle modification.                Nutrition Goals Discharge (Final Nutrition Goals Re-Evaluation):  Nutrition Goals Re-Evaluation - 02/09/22 1325       Goals   Current Weight 194 lb 3.6 oz (88.1 kg)    Comment A1c 7.1, triglycerides 164    Expected Outcome Zier reports motivation to gain up to 205#; he reports losing lean muscle mass during cancer treatment in 2012. He continues 1 protein shake daily (35g protein); discussed alternative strategies for weight gain including protein/nutrition supplements, eating frequency, and increasing calories from fat (nuts/seeds, nut butters, etc). He eats three regular meals and multiple snacks throughout the day. He did recently stop metformin due to blood sugar lows. Quadarius will continue to benefit from participation in intensive cardiac rehab for nutrition education, exercise,and lifestyle modification.             Psychosocial: Target Goals: Acknowledge presence or absence of significant depression and/or stress, maximize coping skills, provide positive support system. Participant is able to verbalize types and ability to use techniques and skills needed for reducing stress and depression.  Initial Review & Psychosocial Screening:  Initial Psych Review & Screening - 01/21/22 0840       Initial Review   Current issues with None Identified      Family Dynamics   Good Support System? Yes   Jaquinn lives alone he has a brother, sister, friends and neighbors for support     Barriers   Psychosocial barriers to participate in program There are no identifiable barriers or psychosocial needs.      Screening Interventions   Interventions Encouraged to exercise             Quality of Life Scores:  Quality of Life - 01/20/22 1457       Quality of Life   Select Quality of Life      Quality of Life Scores   Health/Function Pre 28.37 %    Socioeconomic Pre 27.36 %    Psych/Spiritual Pre 28.29 %    Family  Pre 26.4 %    GLOBAL Pre 27.85 %            Scores of 19 and below usually indicate a poorer quality of life  in these areas.  A difference of  2-3 points is a clinically meaningful difference.  A difference of 2-3 points in the total score of the Quality of Life Index has been associated with significant improvement in overall quality of life, self-image, physical symptoms, and general health in studies assessing change in quality of life.  PHQ-9: Review Flowsheet  More data exists      01/21/2022 01/20/2022 12/29/2021 09/10/2021 08/31/2021  Depression screen PHQ 2/9  Decreased Interest 0 0 0 0 0  Down, Depressed, Hopeless 0 0 0 0 0  PHQ - 2 Score 0 0 0 0 0  Altered sleeping - - - 0 -  Tired, decreased energy - - - 0 -  Change in appetite - - - 0 -  Feeling bad or failure about yourself  - - - 0 -  Trouble concentrating - - - 0 -  Moving slowly or fidgety/restless - - - 0 -  Suicidal thoughts - - - 0 -  PHQ-9 Score - - - 0 -   Interpretation of Total Score  Total Score Depression Severity:  1-4 = Minimal depression, 5-9 = Mild depression, 10-14 = Moderate depression, 15-19 = Moderately severe depression, 20-27 = Severe depression   Psychosocial Evaluation and Intervention:   Psychosocial Re-Evaluation:  Psychosocial Re-Evaluation     Row Name 03/01/22 1556 03/25/22 1052           Psychosocial Re-Evaluation   Current issues with None Identified None Identified      Interventions Encouraged to attend Cardiac Rehabilitation for the exercise Encouraged to attend Cardiac Rehabilitation for the exercise      Continue Psychosocial Services  No Follow up required No Follow up required               Psychosocial Discharge (Final Psychosocial Re-Evaluation):  Psychosocial Re-Evaluation - 03/25/22 1052       Psychosocial Re-Evaluation   Current issues with None Identified    Interventions Encouraged to attend Cardiac Rehabilitation for the exercise    Continue  Psychosocial Services  No Follow up required             Vocational Rehabilitation: Provide vocational rehab assistance to qualifying candidates.   Vocational Rehab Evaluation & Intervention:  Vocational Rehab - 01/21/22 [id number]       Initial Vocational Rehab Evaluation & Intervention   Assessment shows need for Vocational Rehabilitation No   Eligha is retired and does not need vocational rehab at this time            Education: Education Goals: Education classes will be provided on a weekly basis, covering required topics. Participant will state understanding/return demonstration of topics presented.    Education     Row Name 02/09/22 1300     Education   Cardiac Education Topics [employer]   Educator Dietitian   Weekly Topic Personalizing Your [employer]rit[employer] Plate   Instruction Review Code 1- Verbalizes Understanding   Class Start Time 1145   Class Stop Time 1227   Class Time Calculation (min) 42 min    Row Name 02/11/22 1200     Education   Cardiac Education Topics [employer]   Charity fundraiser Exercise Physiologist   Select Psychosocial   Psychosocial Healthy Minds, Bodies, Hearts   Instruction Review Code 1- Verbalizes Understanding   Class Start Time 1140   Class Stop Time  1216   Class Time Calculation (min) 36 min    [place] Name 02/14/22 1200     Education   Cardiac Education Topics Pritikin   IT sales professional Nutrition   Nutrition Workshop Label Reading   Instruction Review Code 1- Verbalizes Understanding   Class Start Time 1145   Class Stop Time 1230   Class Time Calculation (min) 45 min    Bessie Name 02/16/22 1300     Education   Cardiac Education Topics [employer]   Educator Dietitian   Weekly Topic [employer] Desserts   Instruction Review Code 1- Verbalizes Understanding   Class Start  Time 1138   Class Stop Time 1225   Class Time Calculation (min) 47 min    [place] Name 02/18/22 1200     Education   Cardiac Education Topics Pritikin   Lexicographer Nutrition   Nutrition Other  Label Reading   Instruction Review Code 1- Verbalizes Understanding   Class Start Time 1140   Class Stop Time 1225   Class Time Calculation (min) 45 min    [place] Name 02/21/22 [place]   Select Workshops     Workshops   Educator Exercise Physiologist   Select Psychosocial   Psychosocial Workshop Other  Focused goals and sustainable changes   Instruction Review Code 1- Verbalizes Understanding   Class Start Time 1138   Class Stop Time 1220   Class Time Calculation (min) 42 min    [place] Name 02/25/22 1300     Education   Cardiac Education Topics Pritikin   Select Core Videos     Core Videos   Educator Dietitian   Nutrition Calorie Density   Instruction Review Code 1- Verbalizes Understanding   Class Start Time 1142   Class Stop Time 1223   Class Time Calculation (min) 41 min    [place] Name 03/02/22 1300     Education   Cardiac Education Topics Pritikin   Financial trader   Weekly Topic Efficiency Cooking - Meals in a Snap   Instruction Review Code 1- Verbalizes Understanding   Class Start Time 1145   Class Stop Time 1224   Class Time Calculation (min) 39 min    [place] Name 03/07/22 1300     Education   Cardiac Education Topics Pritikin   Nurse, children's   Select Nutrition   Nutrition Nutrition Action Plan   Instruction Review Code 1- Verbalizes Understanding   Class Start Time 1140   Class Stop Time 1220   Class Time Calculation (min) 40 min    [place] Name 03/09/22 1300     Education   Cardiac Education Topics [place]   Educator Dietitian    Weekly Topic One-Pot Wonders   Instruction Review Code 1- Verbalizes Understanding   Class Start Time 1145   Class Stop Time 1225   Class Time Calculation (min) 40 min    [place] Name 03/11/22 1200     Education   Cardiac Education Topics Pritikin   Administrator, sports     Core Videos  Educator Exercise Training and development officer Education   General Education Hypertension and Heart Disease   Instruction Review Code 1- Verbalizes Understanding   Class Start Time 1145   Class Stop Time 1230   Class Time Calculation (min) 45 min    [place] Name 03/14/22 1200     Education   Cardiac Education Topics Pritikin   [employer] Workshops     Workshops   Educator Exercise Physiologist   Psychosocial Workshop Healthy Sleep for a Healthy Heart   Instruction Review Code 1- Verbalizes Understanding   Class Start Time 0155   Class Stop Time 1238   Class Time Calculation (min) 643 min    Frannie Name 03/16/22 1600     Education   Cardiac Education Topics Pritikin   Financial trader   Weekly Topic Comforting Weekend Breakfasts   Instruction Review Code 1- Verbalizes Understanding   Class Start Time 1155   Class Stop Time 1232   Class Time Calculation (min) 37 min    [place] Name 03/21/22 1300     Education   Cardiac Education Topics Pritikin   Academic librarian Exercise Education   Exercise Education Biomechanial Limitations   Instruction Review Code 1- Verbalizes Understanding   Class Start Time 1150   Class Stop Time 1231   Class Time Calculation (min) 41 min    [place] Name 03/23/22 1215     Education   Cardiac Education Topics Pritikin   Financial trader   Weekly Topic Fast Evening Meals   Instruction Review Code 1- Verbalizes Understanding   Class Start Time 1140   Class Stop Time 1215   Class Time Calculation (min) 35 min             Core Videos: Exercise    Move It!  Clinical staff conducted group or individual video education with verbal and written material and guidebook.  Patient learns the recommended Pritikin exercise program. Exercise with the goal of living a long, healthy life. Some of the health benefits of exercise include controlled diabetes, healthier blood pressure levels, improved cholesterol levels, improved heart and lung capacity, improved sleep, and better body composition. Everyone should speak with their doctor before starting or changing an exercise routine.  Biomechanical Limitations Clinical staff conducted group or individual video education with verbal and written material and guidebook.  Patient learns how biomechanical limitations can impact exercise and how we can mitigate and possibly overcome limitations to have an impactful and balanced exercise routine.  Body Composition Clinical staff conducted group or individual video education with verbal and written material and guidebook.  Patient learns that body composition (ratio of muscle mass to fat mass) is a key component to assessing overall fitness, rather than body weight alone. Increased fat mass, especially visceral belly fat, can put [place] at increased risk for metabolic syndrome, type 2 diabetes, heart disease, and even death. It is recommended to combine diet and exercise (cardiovascular and resistance training) to improve your body composition. Seek guidance from your physician and exercise physiologist before implementing an exercise routine.  Exercise Action Plan Clinical staff conducted group or individual video education with verbal and written material and guidebook.  Patient learns the recommended strategies to achieve and enjoy long-term exercise adherence, including variety, self-motivation, self-efficacy, and positive decision making.  Benefits of exercise include fitness, good health, weight management, more energy,  better sleep, less stress, and overall well-being.  Medical   Heart Disease Risk Reduction Clinical staff conducted group or individual video education with verbal and written material and guidebook.  Patient learns our heart is our most vital organ as it circulates oxygen, nutrients, white blood cells, and hormones throughout the entire body, and carries waste away. Data supports a plant-based eating plan like the Pritikin Program for its effectiveness in slowing progression of and reversing heart disease. The video provides a number of recommendations to address heart disease.   Metabolic Syndrome and Belly Fat  Clinical staff conducted group or individual video education with verbal and written material and guidebook.  Patient learns what metabolic syndrome is, how it leads to heart disease, and how one can reverse it and keep it from coming back. You have metabolic syndrome if you have 3 of the following 5 criteria: abdominal obesity, high blood pressure, high triglycerides, low HDL cholesterol, and high blood sugar.  Hypertension and Heart Disease Clinical staff conducted group or individual video education with verbal and written material and guidebook.  Patient learns that high blood pressure, or hypertension, is very common in the [place]. Hypertension is largely due to excessive salt intake, but other important risk factors include being overweight, physical inactivity, drinking too much alcohol, smoking, and not eating enough potassium from fruits and vegetables. High blood pressure is a leading risk factor for heart attack, stroke, congestive heart failure, dementia, kidney failure, and premature death. Long-term effects of excessive salt intake include stiffening of the arteries and thickening of heart muscle and organ damage. Recommendations include ways to reduce hypertension and the risk of heart disease.  Diseases of Our Time - Focusing on Diabetes Clinical staff conducted group  or individual video education with verbal and written material and guidebook.  Patient learns why the best way to stop diseases of our time is prevention, through food and other lifestyle changes. Medicine (such as prescription pills and surgeries) is often only a Band-Aid on the problem, not a long-term solution. Most common diseases of our time include obesity, type 2 diabetes, hypertension, heart disease, and cancer. The Pritikin Program is recommended and has been proven to help reduce, reverse, and/or prevent the damaging effects of metabolic syndrome.  Nutrition   Overview of the Pritikin Eating Plan  Clinical staff conducted group or individual video education with verbal and written material and guidebook.  Patient learns about the [place] for disease risk reduction. The [place] emphasizes a wide variety of unrefined, minimally-processed carbohydrates, like fruits, vegetables, whole grains, and legumes. Go, Caution, and Stop food choices are explained. Plant-based and lean animal proteins are emphasized. Rationale provided for low sodium intake for blood pressure control, low added sugars for blood sugar stabilization, and low added fats and oils for coronary artery disease risk reduction and weight management.  Calorie Density  Clinical staff conducted group or individual video education with verbal and written material and guidebook.  Patient learns about calorie density and how it impacts the Pritikin Eating Plan. Knowing the characteristics of the food you choose will help you decide whether those foods will lead to weight gain or weight loss, and whether you want to consume more or less of them. Weight loss is usually a side effect of the Pritikin Eating Plan because of its focus on low calorie-dense foods.  Label Reading  Clinical staff conducted group or individual  video education with verbal and written material and guidebook.  Patient learns about the Pritikin  recommended label reading guidelines and corresponding recommendations regarding calorie density, added sugars, sodium content, and whole grains.  Dining Out - Part 1  Clinical staff conducted group or individual video education with verbal and written material and guidebook.  Patient learns that restaurant meals can be sabotaging because they can be so high in calories, fat, sodium, and/or sugar. Patient learns recommended strategies on how to positively address this and avoid unhealthy pitfalls.  Facts on Fats  Clinical staff conducted group or individual video education with verbal and written material and guidebook.  Patient learns that lifestyle modifications can be just as effective, if not more so, as many medications for lowering your risk of heart disease. A Pritikin lifestyle can help to reduce your risk of inflammation and atherosclerosis (cholesterol build-up, or plaque, in the artery walls). Lifestyle interventions such as dietary choices and physical activity address the cause of atherosclerosis. A review of the types of fats and their impact on blood cholesterol levels, along with dietary recommendations to reduce fat intake is also included.  Nutrition Action Plan  Clinical staff conducted group or individual video education with verbal and written material and guidebook.  Patient learns how to incorporate Pritikin recommendations into their lifestyle. Recommendations include planning and keeping personal health goals in mind as an important part of their success.  Healthy Mind-Set    Healthy Minds, Bodies, Hearts  Clinical staff conducted group or individual video education with verbal and written material and guidebook.  Patient learns how to identify when they are stressed. Video will discuss the impact of that stress, as well as the many benefits of stress management. Patient will also be introduced to stress management techniques. The way we think, act, and feel has an impact on  our hearts.  How Our Thoughts Can Heal Our Hearts  Clinical staff conducted group or individual video education with verbal and written material and guidebook.  Patient learns that negative thoughts can cause depression and anxiety. This can result in negative lifestyle behavior and serious health problems. Cognitive behavioral therapy is an effective method to help control our thoughts in order to change and improve our emotional outlook.  Additional Videos:  Exercise    Improving Performance  Clinical staff conducted group or individual video education with verbal and written material and guidebook.  Patient learns to use a non-linear approach by alternating intensity levels and lengths of time spent exercising to help burn more calories and lose more body fat. Cardiovascular exercise helps improve heart health, metabolism, hormonal balance, blood sugar control, and recovery from fatigue. Resistance training improves strength, endurance, balance, coordination, reaction time, metabolism, and muscle mass. Flexibility exercise improves circulation, posture, and balance. Seek guidance from your physician and exercise physiologist before implementing an exercise routine and learn your capabilities and proper form for all exercise.  Introduction to Yoga  Clinical staff conducted group or individual video education with verbal and written material and guidebook.  Patient learns about yoga, a discipline of the coming together of mind, breath, and body. The benefits of yoga include improved flexibility, improved range of motion, better posture and core strength, increased lung function, weight loss, and positive self-image. Yoga's heart health benefits include lowered blood pressure, healthier heart rate, decreased cholesterol and triglyceride levels, improved immune function, and reduced stress. Seek guidance from your physician and exercise physiologist before implementing an exercise routine and learn  your capabilities and  proper form for all exercise.  Medical   Aging: Enhancing Your Quality of Life  Clinical staff conducted group or individual video education with verbal and written material and guidebook.  Patient learns key strategies and recommendations to stay in good physical health and enhance quality of life, such as prevention strategies, having an advocate, securing a [place], and keeping a list of medications and system for tracking them. It also discusses how to avoid risk for bone loss.  Biology of Weight Control  Clinical staff conducted group or individual video education with verbal and written material and guidebook.  Patient learns that weight gain occurs because we consume more calories than we burn (eating more, moving less). Even if your body weight is normal, you may have higher ratios of fat compared to muscle mass. Too much body fat puts you at increased risk for cardiovascular disease, heart attack, stroke, type 2 diabetes, and obesity-related cancers. In addition to exercise, following the [place] can help reduce your risk.  Decoding Lab Results  Clinical staff conducted group or individual video education with verbal and written material and guidebook.  Patient learns that lab test reflects one measurement whose values change over time and are influenced by many factors, including medication, stress, sleep, exercise, food, hydration, pre-existing medical conditions, and more. It is recommended to use the knowledge from this video to become more involved with your lab results and evaluate your numbers to speak with your doctor.   Diseases of Our Time - Overview  Clinical staff conducted group or individual video education with verbal and written material and guidebook.  Patient learns that according to the CDC, 50% to 70% of chronic diseases (such as obesity, type 2 diabetes, elevated lipids, hypertension, and heart disease)  are avoidable through lifestyle improvements including healthier food choices, listening to satiety cues, and increased physical activity.  Sleep Disorders Clinical staff conducted group or individual video education with verbal and written material and guidebook.  Patient learns how good quality and duration of sleep are important to overall health and well-being. Patient also learns about sleep disorders and how they impact health along with recommendations to address them, including discussing with a physician.  Nutrition  Dining Out - Part 2 Clinical staff conducted group or individual video education with verbal and written material and guidebook.  Patient learns how to plan ahead and communicate in order to maximize their dining experience in a healthy and nutritious manner. Included are recommended food choices based on the type of restaurant the patient is visiting.   Fueling a Best boy conducted group or individual video education with verbal and written material and guidebook.  There is a strong connection between our food choices and our health. Diseases like obesity and type 2 diabetes are very prevalent and are in large-part due to lifestyle choices. The Pritikin Eating Plan provides plenty of food and hunger-curbing satisfaction. It is easy to follow, affordable, and helps reduce health risks.  Menu Workshop  Clinical staff conducted group or individual video education with verbal and written material and guidebook.  Patient learns that restaurant meals can sabotage health goals because they are often packed with calories, fat, sodium, and sugar. Recommendations include strategies to plan ahead and to communicate with the manager, chef, or server to help order a healthier meal.  Planning Your Eating Strategy  Clinical staff conducted group or individual video education with verbal and written material and guidebook.  Patient learns about the [place]  and its benefit of reducing the risk of disease. The [place] does not focus on calories. Instead, it emphasizes high-quality, nutrient-rich foods. By knowing the characteristics of the foods, we choose, we can determine their calorie density and make informed decisions.  Targeting Your Nutrition Priorities  Clinical staff conducted group or individual video education with verbal and written material and guidebook.  Patient learns that lifestyle habits have a tremendous impact on disease risk and progression. This video provides eating and physical activity recommendations based on your personal health goals, such as reducing LDL cholesterol, losing weight, preventing or controlling type 2 diabetes, and reducing high blood pressure.  Vitamins and Minerals  Clinical staff conducted group or individual video education with verbal and written material and guidebook.  Patient learns different ways to obtain key vitamins and minerals, including through a recommended healthy diet. It is important to discuss all supplements you take with your doctor.   Healthy Mind-Set    Smoking Cessation  Clinical staff conducted group or individual video education with verbal and written material and guidebook.  Patient learns that cigarette smoking and tobacco addiction pose a serious health risk which affects millions of people. Stopping smoking will significantly reduce the risk of heart disease, lung disease, and many forms of cancer. Recommended strategies for quitting are covered, including working with your doctor to develop a successful plan.  Culinary   Becoming a Financial trader conducted group or individual video education with verbal and written material and guidebook.  Patient learns that cooking at home can be healthy, cost-effective, quick, and puts them in control. Keys to cooking healthy recipes will include looking at your recipe, assessing your equipment needs, planning  ahead, making it simple, choosing cost-effective seasonal ingredients, and limiting the use of added fats, salts, and sugars.  Cooking - Breakfast and Snacks  Clinical staff conducted group or individual video education with verbal and written material and guidebook.  Patient learns how important breakfast is to satiety and nutrition through the entire day. Recommendations include key foods to eat during breakfast to help stabilize blood sugar levels and to prevent overeating at meals later in the day. Planning ahead is also a key component.  Cooking - Human resources officer conducted group or individual video education with verbal and written material and guidebook.  Patient learns eating strategies to improve overall health, including an approach to cook more at home. Recommendations include thinking of animal protein as a side on your plate rather than center stage and focusing instead on lower calorie dense options like vegetables, fruits, whole grains, and plant-based proteins, such as beans. Making sauces in large quantities to freeze for later and leaving the skin on your vegetables are also recommended to maximize your experience.  Cooking - Healthy Salads and Dressing Clinical staff conducted group or individual video education with verbal and written material and guidebook.  Patient learns that vegetables, fruits, whole grains, and legumes are the foundations of the [place]. Recommendations include how to incorporate each of these in flavorful and healthy salads, and how to create homemade salad dressings. Proper handling of ingredients is also covered. Cooking - Soups and [employer] - Soups and Desserts Clinical staff conducted group or individual video education with verbal and written material and guidebook.  Patient learns that Pritikin soups and desserts make for easy, nutritious, and delicious snacks and meal components that are low  in sodium, fat, sugar,  and calorie density, while high in vitamins, minerals, and filling fiber. Recommendations include simple and healthy ideas for soups and desserts.   Overview     The Pritikin Solution Program Overview Clinical staff conducted group or individual video education with verbal and written material and guidebook.  Patient learns that the results of the [place] Program have been documented in more than 100 articles published in peer-reviewed journals, and the benefits include reducing risk factors for (and, in some cases, even reversing) high cholesterol, high blood pressure, type 2 diabetes, obesity, and more! An overview of the three key pillars of the Pritikin Program will be covered: eating well, doing regular exercise, and having a healthy mind-set.  WORKSHOPS  Exercise: Exercise Basics: Building Your Action Plan Clinical staff led group instruction and group discussion with PowerPoint presentation and patient guidebook. To enhance the learning environment the use of posters, models and videos may be added. At the conclusion of this workshop, patients will comprehend the difference between physical activity and exercise, as well as the benefits of incorporating both, into their routine. Patients will understand the FITT (Frequency, Intensity, Time, and Type) principle and how to use it to build an exercise action plan. In addition, safety concerns and other considerations for exercise and cardiac rehab will be addressed by the presenter. The purpose of this lesson is to promote a comprehensive and effective weekly exercise routine in order to improve patients' overall level of fitness.   Managing Heart Disease: Your Path to a Healthier Heart Clinical staff led group instruction and group discussion with PowerPoint presentation and patient guidebook. To enhance the learning environment the use of posters, models and videos may be added.At the conclusion of this workshop, patients will understand  the anatomy and physiology of the heart. Additionally, they will understand how Pritikin's three pillars impact the risk factors, the progression, and the management of heart disease.  The purpose of this lesson is to provide a high-level overview of the heart, heart disease, and how the Pritikin lifestyle positively impacts risk factors.  Exercise Biomechanics Clinical staff led group instruction and group discussion with PowerPoint presentation and patient guidebook. To enhance the learning environment the use of posters, models and videos may be added. Patients will learn how the structural parts of their bodies function and how these functions impact their daily activities, movement, and exercise. Patients will learn how to promote a neutral spine, learn how to manage pain, and identify ways to improve their physical movement in order to promote healthy living. The purpose of this lesson is to expose patients to common physical limitations that impact physical activity. Participants will learn practical ways to adapt and manage aches and pains, and to minimize their effect on regular exercise. Patients will learn how to maintain good posture while sitting, walking, and lifting.  Balance Training and Fall Prevention  Clinical staff led group instruction and group discussion with PowerPoint presentation and patient guidebook. To enhance the learning environment the use of posters, models and videos may be added. At the conclusion of this workshop, patients will understand the importance of their sensorimotor skills (vision, proprioception, and the vestibular system) in maintaining their ability to balance as they age. Patients will apply a variety of balancing exercises that are appropriate for their current level of function. Patients will understand the common causes for poor balance, possible solutions to these problems, and ways to modify their physical environment in order to minimize  their fall risk.  The purpose of this lesson is to teach patients about the importance of maintaining balance as they age and ways to minimize their risk of falling.  WORKSHOPS   Nutrition:  Fueling a Scientist, research (physical sciences) led group instruction and group discussion with PowerPoint presentation and patient guidebook. To enhance the learning environment the use of posters, models and videos may be added. Patients will review the foundational principles of the [place] and understand what constitutes a serving size in each of the food groups. Patients will also learn [facility]-friendly foods that are better choices when away from home and review make-ahead meal and snack options. Calorie density will be reviewed and applied to three nutrition priorities: weight maintenance, weight loss, and weight gain. The purpose of this lesson is to reinforce (in a group setting) the key concepts around what patients are recommended to eat and how to apply these guidelines when away from home by planning and selecting Pritikin-friendly options. Patients will understand how calorie density may be adjusted for different weight management goals.  Mindful Eating  Clinical staff led group instruction and group discussion with PowerPoint presentation and patient guidebook. To enhance the learning environment the use of posters, models and videos may be added. Patients will briefly review the concepts of the [place] and the importance of low-calorie dense foods. The concept of mindful eating will be introduced as well as the importance of paying attention to internal hunger signals. Triggers for non-hunger eating and techniques for dealing with triggers will be explored. The purpose of this lesson is to provide patients with the opportunity to review the basic principles of the [place], discuss the value of eating mindfully and how to measure internal cues of hunger and fullness using the  Hunger Scale. Patients will also discuss reasons for non-hunger eating and learn strategies to use for controlling emotional eating.  Targeting Your Nutrition Priorities Clinical staff led group instruction and group discussion with PowerPoint presentation and patient guidebook. To enhance the learning environment the use of posters, models and videos may be added. Patients will learn how to determine their genetic susceptibility to disease by reviewing their family history. Patients will gain insight into the importance of diet as part of an overall healthy lifestyle in mitigating the impact of genetics and other environmental insults. The purpose of this lesson is to provide patients with the opportunity to assess their personal nutrition priorities by looking at their family history, their own health history and current risk factors. Patients will also be able to discuss ways of prioritizing and modifying the [employer] for their highest risk areas  Menu  Clinical staff led group instruction and group discussion with PowerPoint presentation and patient guidebook. To enhance the learning environment the use of posters, models and videos may be added. Using menus brought in from [employer], or printed from [employer], patients will apply the [place] dining out guidelines that were presented in the [employer] video. Patients will also be able to practice these guidelines in a variety of provided scenarios. The purpose of this lesson is to provide patients with the opportunity to practice hands-on learning of the [place] with actual menus and practice scenarios.  Label Reading Clinical staff led group instruction and group discussion with PowerPoint presentation and patient guidebook. To enhance the learning environment the use of posters, models and videos may be added. Patients will review and discuss the [facility] label reading guidelines  presented in [employer]'s Label Reading Educational series video. Using fool labels brought in from local grocery stores and markets, patients will apply the label reading guidelines and determine if the packaged food meet the [facility]ikin guidelines. The purpose of this lesson is to provide patients with the opportunity to review, discuss, and practice hands-on learning of the [facility] Label Reading guidelines with actual packaged food labels. [place] Workshops are designed to teach patients ways to prepare quick, simple, and affordable recipes at home. The importance of nutrition's role in chronic disease risk reduction is reflected in its emphasis in the overall [facility] program. By learning how to prepare essential core [facility] Eating Plan recipes, patients will increase control over what they eat; be able to customize the flavor of foods without the use of added salt, sugar, or fat; and improve the quality of the food they consume. By learning a set of core recipes which are easily assembled, quickly prepared, and affordable, patients are more likely to prepare more healthy foods at home. These workshops focus on convenient breakfasts, simple entres, side dishes, and desserts which can be prepared with minimal effort and are consistent with nutrition recommendations for cardiovascular risk reduction. Cooking [employer] are taught by a Engineer, materials (RD) who has been trained by the [employer]. The chef or RD has a clear understanding of the importance of minimizing - if not completely eliminating - added fat, sugar, and sodium in recipes. Throughout the series of [place] Workshop sessions, patients will learn about healthy ingredients and efficient methods of cooking to build confidence in their capability to prepare    Cooking School weekly topics:  Adding Flavor- Sodium-Free  Fast and Healthy Breakfasts  Powerhouse Plant-Based  Proteins  Satisfying Salads and Dressings  Simple Sides and Sauces  International Cuisine-Spotlight on the [place] Zones  Delicious Desserts  Savory Soups  Efficiency Cooking - Meals in a Snap  Tasty Appetizers and Snacks  Comforting Weekend Breakfasts  One-Pot Wonders   Fast Evening Meals  Easy [place] (Psychosocial): New Thoughts, New Behaviors Clinical staff led group instruction and group discussion with PowerPoint presentation and patient guidebook. To enhance the learning environment the use of posters, models and videos may be added. Patients will learn and practice techniques for developing effective health and lifestyle goals. Patients will be able to effectively apply the goal setting process learned to develop at least one new personal goal.  The purpose of this lesson is to expose patients to a new skill set of behavior modification techniques such as techniques setting SMART goals, overcoming barriers, and achieving new thoughts and new behaviors.  Managing Moods and Relationships Clinical staff led group instruction and group discussion with PowerPoint presentation and patient guidebook. To enhance the learning environment the use of posters, models and videos may be added. Patients will learn how emotional and chronic stress factors can impact their health and relationships. They will learn healthy ways to manage their moods and utilize positive coping mechanisms. In addition, ICR patients will learn ways to improve communication skills. The purpose of this lesson is to expose patients to ways of understanding how one's mood and health are intimately connected. Developing a healthy outlook can help build positive relationships and connections with others. Patients will understand the importance of utilizing effective communication skills that include actively listening and being heard. They will learn and  understand the importance  of the "4 Cs" and especially Connections in fostering of a Healthy Mind-Set.  Healthy Sleep for a Healthy Heart Clinical staff led group instruction and group discussion with PowerPoint presentation and patient guidebook. To enhance the learning environment the use of posters, models and videos may be added. At the conclusion of this workshop, patients will be able to demonstrate knowledge of the importance of sleep to overall health, well-being, and quality of life. They will understand the symptoms of, and treatments for, common sleep disorders. Patients will also be able to identify daytime and nighttime behaviors which impact sleep, and they will be able to apply these tools to help manage sleep-related challenges. The purpose of this lesson is to provide patients with a general overview of sleep and outline the importance of quality sleep. Patients will learn about a few of the most common sleep disorders. Patients will also be introduced to the concept of "sleep hygiene," and discover ways to self-manage certain sleeping problems through simple daily behavior changes. Finally, the workshop will motivate patients by clarifying the links between quality sleep and their goals of heart-healthy living.   Recognizing and Reducing Stress Clinical staff led group instruction and group discussion with PowerPoint presentation and patient guidebook. To enhance the learning environment the use of posters, models and videos may be added. At the conclusion of this workshop, patients will be able to understand the types of stress reactions, differentiate between acute and chronic stress, and recognize the impact that chronic stress has on their health. They will also be able to apply different coping mechanisms, such as reframing negative self-talk. Patients will have the opportunity to practice a variety of stress management techniques, such as deep abdominal breathing, progressive muscle  relaxation, and/or guided imagery.  The purpose of this lesson is to educate patients on the role of stress in their lives and to provide healthy techniques for coping with it.  Learning Barriers/Preferences:  Learning Barriers/Preferences - 01/20/22 1547       Learning Barriers/Preferences   Learning Barriers Sight   wears glasses   Learning Preferences Audio;Skilled Demonstration;Computer/Internet;Verbal Instruction;Group Instruction;Video;Individual Instruction;Written Material;Pictoral             Education Topics:  Knowledge Questionnaire Score:  Knowledge Questionnaire Score - 01/20/22 1458       Knowledge Questionnaire Score   Pre Score 19/24             Core Components/Risk Factors/Patient Goals at Admission:  Personal Goals and Risk Factors at Admission - 01/20/22 1547       Core Components/Risk Factors/Patient Goals on Admission    Weight Management Yes;Weight Maintenance    Intervention Weight Management: Develop a combined nutrition and exercise program designed to reach desired caloric intake, while maintaining appropriate intake of nutrient and fiber, sodium and fats, and appropriate energy expenditure required for the weight goal.;Weight Management: Provide education and appropriate resources to help participant work on and attain dietary goals.    Expected Outcomes Weight Maintenance: Understanding of the daily nutrition guidelines, which includes 25-35% calories from fat, 7% or less cal from saturated fats, less than '200mg'$  cholesterol, less than 1.5gm of sodium, & 5 or more servings of fruits and vegetables daily;Weight Loss: Understanding of general recommendations for a balanced deficit meal plan, which promotes 1-2 lb weight loss per week and includes a negative energy balance of [id number] kcal/d    Diabetes Yes    Intervention Provide education about signs/symptoms and action to take for hypo/hyperglycemia.;Provide education about proper  nutrition,  including hydration, and aerobic/resistive exercise prescription along with prescribed medications to achieve blood glucose in normal ranges: Fasting glucose 65-99 mg/dL    Expected Outcomes Short Term: Participant verbalizes understanding of the signs/symptoms and immediate care of hyper/hypoglycemia, proper foot care and importance of medication, aerobic/resistive exercise and nutrition plan for blood glucose control.;Long Term: Attainment of HbA1C < 7%.    Hypertension Yes    Intervention Provide education on lifestyle modifcations including regular physical activity/exercise, weight management, moderate sodium restriction and increased consumption of fresh fruit, vegetables, and low fat dairy, alcohol moderation, and smoking cessation.;Monitor prescription use compliance.    Expected Outcomes Short Term: Continued assessment and intervention until BP is < 140/74m HG in hypertensive participants. < 130/849mHG in hypertensive participants with diabetes, heart failure or chronic kidney disease.;Long Term: Maintenance of blood pressure at goal levels.    Lipids Yes    Intervention Provide education and support for participant on nutrition & aerobic/resistive exercise along with prescribed medications to achieve LDL '70mg'$ , HDL >'40mg'$ .    Expected Outcomes Short Term: Participant states understanding of desired cholesterol values and is compliant with medications prescribed. Participant is following exercise prescription and nutrition guidelines.;Long Term: Cholesterol controlled with medications as prescribed, with individualized exercise RX and with personalized nutrition plan. Value goals: LDL < '70mg'$ , HDL > 40 mg.             Core Components/Risk Factors/Patient Goals Review:   Goals and Risk Factor Review     Row Name 03/01/22 1556 03/25/22 1052           Core Components/Risk Factors/Patient Goals Review   Personal Goals Review Weight Management/Obesity;Hypertension;Diabetes;Lipids Weight  Management/Obesity;Hypertension;Diabetes;Lipids      Review HeCiceroas been doing well with exercise. Vital signs have been stable. CBg's have been variable. Diabetic meds have been decreased by Dr PlMarylyn Ishiharaas been doing well with exercise. Vital signs have been stable. CBg's have been WNL. HeKaldenill complete intensive cardiac rehab on 04/01/22      Expected Outcomes HeMottill continue to participate in intensive cardiac rehab for exercise, nutrition and lifestyle modifications HeCandaceill continue to participate in intensive cardiac rehab for exercise, nutrition and lifestyle modifications               Core Components/Risk Factors/Patient Goals at Discharge (Final Review):   Goals and Risk Factor Review - 03/25/22 1052       Core Components/Risk Factors/Patient Goals Review   Personal Goals Review Weight Management/Obesity;Hypertension;Diabetes;Lipids    Review HeTurneras been doing well with exercise. Vital signs have been stable. CBg's have been WNL. HeHarvyill complete intensive cardiac rehab on 04/01/22    Expected Outcomes HeKyreeceill continue to participate in intensive cardiac rehab for exercise, nutrition and lifestyle modifications             ITP Comments:  ITP Comments     Row Name 01/21/22 [id number] 03/01/22 1554 03/25/22 1051     ITP Comments Introduction to [facility] Education Program/ Intensive Cardiac Rehab. Initial Orientatin Packet Reviewed with the patient 30 Day ITP review. Vilas's cardiac rehab is currently on hold until aftter he sees Dr PlAlain Marionn 02/04/22 as his CBG's have been low. HeTavontehould start intensive cardiac rehab on 02/07/22. 30 Day ITP review. HeTobynas good attendance and participation in intensive cardiac rehab when in attendance 30 Day ITP review. HeTrashonas good attendance and participation in intensive cardiac rehab. HeLawarenceill complete intensive cardiac rehab  on 04/01/22             Comments: See ITP Comments

## 2022-03-28 ENCOUNTER — Encounter (HOSPITAL_COMMUNITY)
Admission: RE | Admit: 2022-03-28 | Discharge: 2022-03-28 | Disposition: A | Discharge status: Home / Self Care | Payer: Medicare Other | Source: Ambulatory Visit | Attending: Cardiovascular Disease | Admitting: Cardiovascular Disease

## 2022-03-28 DIAGNOSIS — Z48812 Encounter for surgical aftercare following surgery on the circulatory system: Secondary | ICD-10-CM | POA: Diagnosis not present

## 2022-03-28 DIAGNOSIS — Z951 Presence of aortocoronary bypass graft: Secondary | ICD-10-CM

## 2022-03-28 DIAGNOSIS — Z9889 Other specified postprocedural states: Secondary | ICD-10-CM | POA: Diagnosis not present

## 2022-03-29 ENCOUNTER — Ambulatory Visit: Payer: Medicare Other | Attending: Thoracic Surgery (Cardiothoracic Vascular Surgery)

## 2022-03-29 DIAGNOSIS — Z9889 Other specified postprocedural states: Secondary | ICD-10-CM

## 2022-03-29 DIAGNOSIS — I480 Paroxysmal atrial fibrillation: Secondary | ICD-10-CM

## 2022-03-29 DIAGNOSIS — I059 Rheumatic mitral valve disease, unspecified: Secondary | ICD-10-CM

## 2022-03-29 DIAGNOSIS — Z8679 Personal history of other diseases of the circulatory system: Secondary | ICD-10-CM

## 2022-03-29 DIAGNOSIS — Z7901 Long term (current) use of anticoagulants: Secondary | ICD-10-CM | POA: Diagnosis not present

## 2022-03-29 DIAGNOSIS — I4891 Unspecified atrial fibrillation: Secondary | ICD-10-CM

## 2022-03-29 LAB — POCT INR: INR: 2.5 (ref 2.0–3.0)

## 2022-03-29 NOTE — Patient Instructions (Signed)
Description   Continue taking warfarin 3 tablets daily except 2.5 tablets each Sundays, Tuesdays, and Thursdays. Repeat INR in 4 weeks. Anticoagulation Clinic [phone]

## 2022-03-30 ENCOUNTER — Encounter (HOSPITAL_COMMUNITY)
Admission: RE | Admit: 2022-03-30 | Discharge: 2022-03-30 | Disposition: A | Discharge status: Home / Self Care | Payer: Medicare Other | Source: Ambulatory Visit | Attending: Cardiovascular Disease | Admitting: Cardiovascular Disease

## 2022-03-30 DIAGNOSIS — Z951 Presence of aortocoronary bypass graft: Secondary | ICD-10-CM

## 2022-03-30 DIAGNOSIS — Z9889 Other specified postprocedural states: Secondary | ICD-10-CM

## 2022-03-30 DIAGNOSIS — Z48812 Encounter for surgical aftercare following surgery on the circulatory system: Secondary | ICD-10-CM | POA: Diagnosis not present

## 2022-04-01 ENCOUNTER — Encounter (HOSPITAL_COMMUNITY): Payer: Medicare Other

## 2022-04-02 DIAGNOSIS — G4733 Obstructive sleep apnea (adult) (pediatric): Secondary | ICD-10-CM | POA: Diagnosis not present

## 2022-04-04 ENCOUNTER — Encounter (HOSPITAL_COMMUNITY)
Admission: RE | Admit: 2022-04-04 | Discharge: 2022-04-04 | Disposition: A | Discharge status: Home / Self Care | Payer: Medicare Other | Source: Ambulatory Visit | Attending: Cardiovascular Disease | Admitting: Cardiovascular Disease

## 2022-04-04 DIAGNOSIS — Z9889 Other specified postprocedural states: Secondary | ICD-10-CM | POA: Diagnosis not present

## 2022-04-04 DIAGNOSIS — Z48812 Encounter for surgical aftercare following surgery on the circulatory system: Secondary | ICD-10-CM | POA: Diagnosis not present

## 2022-04-04 DIAGNOSIS — Z951 Presence of aortocoronary bypass graft: Secondary | ICD-10-CM | POA: Diagnosis not present

## 2022-04-06 ENCOUNTER — Encounter (HOSPITAL_COMMUNITY)
Admission: RE | Admit: 2022-04-06 | Discharge: 2022-04-06 | Disposition: A | Discharge status: Home / Self Care | Payer: Medicare Other | Source: Ambulatory Visit | Attending: Cardiovascular Disease | Admitting: Cardiovascular Disease

## 2022-04-06 DIAGNOSIS — Z9889 Other specified postprocedural states: Secondary | ICD-10-CM | POA: Diagnosis not present

## 2022-04-06 DIAGNOSIS — Z951 Presence of aortocoronary bypass graft: Secondary | ICD-10-CM

## 2022-04-06 DIAGNOSIS — Z48812 Encounter for surgical aftercare following surgery on the circulatory system: Secondary | ICD-10-CM | POA: Diagnosis not present

## 2022-04-08 ENCOUNTER — Encounter (HOSPITAL_COMMUNITY)
Admission: RE | Admit: 2022-04-08 | Discharge: 2022-04-08 | Disposition: A | Discharge status: Home / Self Care | Payer: Medicare Other | Source: Ambulatory Visit | Attending: Cardiovascular Disease | Admitting: Cardiovascular Disease

## 2022-04-08 DIAGNOSIS — Z951 Presence of aortocoronary bypass graft: Secondary | ICD-10-CM

## 2022-04-08 DIAGNOSIS — Z9889 Other specified postprocedural states: Secondary | ICD-10-CM | POA: Diagnosis not present

## 2022-04-08 DIAGNOSIS — Z48812 Encounter for surgical aftercare following surgery on the circulatory system: Secondary | ICD-10-CM | POA: Diagnosis not present

## 2022-04-11 ENCOUNTER — Encounter (HOSPITAL_COMMUNITY)
Admission: RE | Admit: 2022-04-11 | Discharge: 2022-04-11 | Disposition: A | Discharge status: Home / Self Care | Payer: Medicare Other | Source: Ambulatory Visit | Attending: Cardiovascular Disease | Admitting: Cardiovascular Disease

## 2022-04-11 DIAGNOSIS — Z951 Presence of aortocoronary bypass graft: Secondary | ICD-10-CM

## 2022-04-11 DIAGNOSIS — Z9889 Other specified postprocedural states: Secondary | ICD-10-CM | POA: Diagnosis not present

## 2022-04-11 DIAGNOSIS — Z48812 Encounter for surgical aftercare following surgery on the circulatory system: Secondary | ICD-10-CM | POA: Diagnosis not present

## 2022-04-13 ENCOUNTER — Encounter (HOSPITAL_COMMUNITY)
Admission: RE | Admit: 2022-04-13 | Discharge: 2022-04-13 | Disposition: A | Discharge status: Home / Self Care | Payer: Medicare Other | Source: Ambulatory Visit | Attending: Cardiovascular Disease | Admitting: Cardiovascular Disease

## 2022-04-13 DIAGNOSIS — Z48812 Encounter for surgical aftercare following surgery on the circulatory system: Secondary | ICD-10-CM | POA: Diagnosis not present

## 2022-04-13 DIAGNOSIS — Z9889 Other specified postprocedural states: Secondary | ICD-10-CM | POA: Diagnosis not present

## 2022-04-13 DIAGNOSIS — Z951 Presence of aortocoronary bypass graft: Secondary | ICD-10-CM

## 2022-04-15 ENCOUNTER — Encounter (HOSPITAL_COMMUNITY)
Admission: RE | Admit: 2022-04-15 | Discharge: 2022-04-15 | Disposition: A | Discharge status: Home / Self Care | Payer: Medicare Other | Source: Ambulatory Visit | Attending: Cardiovascular Disease | Admitting: Cardiovascular Disease

## 2022-04-15 VITALS — BP 94/62 | HR 61 | Ht 69.5 in | Wt 190.7 lb

## 2022-04-15 DIAGNOSIS — Z48812 Encounter for surgical aftercare following surgery on the circulatory system: Secondary | ICD-10-CM | POA: Diagnosis not present

## 2022-04-15 DIAGNOSIS — Z951 Presence of aortocoronary bypass graft: Secondary | ICD-10-CM | POA: Diagnosis not present

## 2022-04-15 DIAGNOSIS — Z9889 Other specified postprocedural states: Secondary | ICD-10-CM

## 2022-04-15 NOTE — Progress Notes (Signed)
Discharge Progress Report  Patient Details  Name: DEMARK BONAVENTURA MRN: [id number] Date of Birth: 13-Jun-1945 Referring Provider:   Flowsheet Row INTENSIVE CARDIAC REHAB ORIENT from 01/25/2022 in [facility]  Referring Provider [name], MD        Number of Visits: 45  Reason for Discharge:  Patient reached a stable level of exercise. Patient independent in their exercise. Patient has met program and personal goals.  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  ADL UCSD:   Initial Exercise Prescription:  Initial Exercise Prescription - 01/25/22 1500       Date of Initial Exercise RX and Referring Provider   Date 01/25/22    Referring Provider [name], MD    Expected Discharge Date 03/18/22      Recumbant Bike   Level 2    RPM 60    Watts 15    Minutes 15    METs 2.6      Arm Ergometer   Level 2    Watts 37    RPM 60    Minutes 15    METs 2.6      Prescription Details   Frequency (times per week) 3    Duration Progress to 30 minutes of continuous aerobic without signs/symptoms of physical distress      Intensity   THRR 40-80% of Max Heartrate 58-115    Ratings of Perceived Exertion 11-13    Perceived Dyspnea 0-4      Progression   Progression Continue progressive overload as per policy without signs/symptoms or physical distress.      Resistance Training   Training Prescription Yes    Weight 3 lbs    Reps 10-15             Discharge Exercise Prescription (Final Exercise Prescription Changes):  Exercise Prescription Changes - 03/21/22 1026        Response to Exercise   Blood Pressure (Admit) 98/62    Blood Pressure (Exercise) 122/62    Blood Pressure (Exit) 104/78    Heart Rate (Admit) 54 bpm    Heart Rate (Exercise) 108 bpm    Heart Rate (Exit) 63 bpm    Rating of Perceived Exertion (Exercise) 13    Symptoms None    Duration Continue with 30 min of aerobic exercise without signs/symptoms of physical distress.    Intensity THRR unchanged      Progression   Progression Continue to progress workloads to maintain intensity without signs/symptoms of physical distress.    Average METs 3.6      Resistance Training   Training Prescription Yes    Weight 4 lbs    Reps 10-15    Time 10 Minutes      Interval Training   Interval Training No      Recumbant Bike   Level 3    RPM 64    Watts 50    Minutes 15    METs 3.1      NuStep   Level 3    SPM 129  Minutes 15    METs 4.1      Home Exercise Plan   Plans to continue exercise at Home (comment)   Stationary bike   Frequency Add 4 additional days to program exercise sessions.    Initial Home Exercises Provided 03/02/22             Functional Capacity:  6 Minute Walk     Row Name 01/25/22 1419 04/13/22 1045       6 Minute Walk   Phase Initial Discharge    Distance 1346 feet 1807 feet    Distance % Change -- 34.25 %    Distance Feet Change -- 461 ft    Walk Time 6 minutes 6 minutes    # of Rest Breaks 0 0    MPH 2.55 3.42    METS 2.6 3.54    RPE 11 8    Perceived Dyspnea  0 0    VO2 Peak 9.01 12.4    Symptoms No No    Resting HR 57 bpm 65 bpm    Resting BP 99/66 96/60    Resting Oxygen Saturation  98 % --    Exercise Oxygen Saturation  during 6 min walk 100 % --    Max Ex. HR 85 bpm 106 bpm    Max Ex. BP 137/90 132/70    2 Minute Post BP 123/84 100/62             Psychological, QOL, Others - Outcomes: PHQ 2/9:    04/15/2022   12:35 PM 01/21/2022    8:44 AM 01/20/2022   11:34 AM 12/29/2021    3:58 PM 09/10/2021    8:58 AM   Depression screen PHQ 2/9  Decreased Interest 0 0 0 0 0  Down, Depressed, Hopeless 0 0 0 0 0  PHQ - 2 Score 0 0 0 0 0  Altered sleeping     0  Tired, decreased energy     0  Change in appetite     0  Feeling bad or failure about yourself      0  Trouble concentrating     0  Moving slowly or fidgety/restless     0  Suicidal thoughts     0  PHQ-9 Score     0    Quality of Life:  Quality of Life - 01/20/22 1457       Quality of Life   Select Quality of Life      Quality of Life Scores   Health/Function Pre 28.37 %    Socioeconomic Pre 27.36 %    Psych/Spiritual Pre 28.29 %    Family Pre 26.4 %    GLOBAL Pre 27.85 %             Personal Goals: Goals established at orientation with interventions provided to work toward goal.  Personal Goals and Risk Factors at Admission - 01/20/22 1547       Core Components/Risk Factors/Patient Goals on Admission    Weight Management Yes;Weight Maintenance    Intervention Weight Management: Develop a combined nutrition and exercise program designed to reach desired caloric intake, while maintaining appropriate intake of nutrient and fiber, sodium and fats, and appropriate energy expenditure required for the weight goal.;Weight Management: Provide education and appropriate resources to help participant work on and attain dietary goals.    Expected Outcomes Weight Maintenance: Understanding of the daily nutrition guidelines, which includes 25-35% calories from fat, 7% or less cal from saturated  fats, less than 200mg  cholesterol, less than 1.5gm of sodium, & 5 or more servings of fruits and vegetables daily;Weight Loss: Understanding of general recommendations for a balanced deficit meal plan, which promotes 1-2 lb weight loss per week and includes a negative energy balance of [phone] kcal/d    Diabetes Yes    Intervention Provide education about signs/symptoms and action to take for hypo/hyperglycemia.;Provide education about proper nutrition,  including hydration, and aerobic/resistive exercise prescription along with prescribed medications to achieve blood glucose in normal ranges: Fasting glucose 65-99 mg/dL    Expected Outcomes Short Term: Participant verbalizes understanding of the signs/symptoms and immediate care of hyper/hypoglycemia, proper foot care and importance of medication, aerobic/resistive exercise and nutrition plan for blood glucose control.;Long Term: Attainment of HbA1C < 7%.    Hypertension Yes    Intervention Provide education on lifestyle modifcations including regular physical activity/exercise, weight management, moderate sodium restriction and increased consumption of fresh fruit, vegetables, and low fat dairy, alcohol moderation, and smoking cessation.;Monitor prescription use compliance.    Expected Outcomes Short Term: Continued assessment and intervention until BP is < 140/24mm HG in hypertensive participants. < 130/54mm HG in hypertensive participants with diabetes, heart failure or chronic kidney disease.;Long Term: Maintenance of blood pressure at goal levels.    Lipids Yes    Intervention Provide education and support for participant on nutrition & aerobic/resistive exercise along with prescribed medications to achieve LDL 70mg , HDL >40mg .    Expected Outcomes Short Term: Participant states understanding of desired cholesterol values and is compliant with medications prescribed. Participant is following exercise prescription and nutrition guidelines.;Long Term: Cholesterol controlled with medications as prescribed, with individualized exercise RX and with personalized nutrition plan. Value goals: LDL < 70mg , HDL > 40 mg.              Personal Goals Discharge:  Goals and Risk Factor Review     Row Name 03/01/22 1556 03/25/22 1052           Core Components/Risk Factors/Patient Goals Review   Personal Goals Review Weight Management/Obesity;Hypertension;Diabetes;Lipids Weight  Management/Obesity;Hypertension;Diabetes;Lipids      Review Yeremy has been doing well with exercise. Vital signs have been stable. CBg's have been variable. Diabetic meds have been decreased by Dr Marylyn Ishihara has been doing well with exercise. Vital signs have been stable. CBg's have been WNL. Zyel will complete intensive cardiac rehab on 04/01/22      Expected Outcomes Draylen will continue to participate in intensive cardiac rehab for exercise, nutrition and lifestyle modifications Song will continue to participate in intensive cardiac rehab for exercise, nutrition and lifestyle modifications               Exercise Goals and Review:  Exercise Goals     Row Name 01/25/22 1530             Exercise Goals   Increase Physical Activity Yes       Intervention Provide advice, education, support and counseling about physical activity/exercise needs.;Develop an individualized exercise prescription for aerobic and resistive training based on initial evaluation findings, risk stratification, comorbidities and participant's personal goals.       Expected Outcomes Short Term: Attend rehab on a regular basis to increase amount of physical activity.;Long Term: Add in home exercise to make exercise part of routine and to increase amount of physical activity.;Long Term: Exercising regularly at least 3-5 days a week.       Increase Strength and Stamina Yes  Intervention Provide advice, education, support and counseling about physical activity/exercise needs.;Develop an individualized exercise prescription for aerobic and resistive training based on initial evaluation findings, risk stratification, comorbidities and participant's personal goals.       Expected Outcomes Short Term: Increase workloads from initial exercise prescription for resistance, speed, and METs.;Short Term: Perform resistance training exercises routinely during rehab and add in resistance training at home;Long Term: Improve  cardiorespiratory fitness, muscular endurance and strength as measured by increased METs and functional capacity (6MWT)       Able to understand and use rate of perceived exertion (RPE) scale Yes       Intervention Provide education and explanation on how to use RPE scale       Expected Outcomes Short Term: Able to use RPE daily in rehab to express subjective intensity level;Long Term:  Able to use RPE to guide intensity level when exercising independently       Knowledge and understanding of Target Heart Rate Range (THRR) Yes       Intervention Provide education and explanation of THRR including how the numbers were predicted and where they are located for reference       Expected Outcomes Long Term: Able to use THRR to govern intensity when exercising independently;Short Term: Able to use daily as guideline for intensity in rehab;Short Term: Able to state/look up THRR       Understanding of Exercise Prescription Yes       Intervention Provide education, explanation, and written materials on patient's individual exercise prescription       Expected Outcomes Short Term: Able to explain program exercise prescription;Long Term: Able to explain home exercise prescription to exercise independently                Exercise Goals Re-Evaluation:  Exercise Goals Re-Evaluation     Row Name 02/01/22 0820 02/09/22 1126 03/02/22 1111 03/21/22 1103 04/13/22 1140     Exercise Goal Re-Evaluation   Exercise Goals Review -- Increase Physical Activity;Able to understand and use rate of perceived exertion (RPE) scale Increase Physical Activity;Able to understand and use rate of perceived exertion (RPE) scale;Increase Strength and Stamina;Knowledge and understanding of Target Heart Rate Range (THRR);Understanding of Exercise Prescription Increase Physical Activity;Able to understand and use rate of perceived exertion (RPE) scale;Increase Strength and Stamina;Knowledge and understanding of Target Heart Rate Range  (THRR);Understanding of Exercise Prescription Increase Physical Activity;Able to understand and use rate of perceived exertion (RPE) scale;Increase Strength and Stamina;Knowledge and understanding of Target Heart Rate Range (THRR);Understanding of Exercise Prescription   Comments Patient unable to exercise due to low blood sugar. Will resume exercise on 02/07/22. Patient able to understand and use RPE scale appropriately, Reviewed exercise prescription with patient. Patient is riding his stationary bike, walking, and using his resistance bands at home. Patient continue to make steady progress with exercise. Patient continues his home exercise routine riding his stationary bike 15 minutes, 4 days/week in addition to exercise at cardiac rehab. Patient states he's getting better. Patient will complete the cardiac rehab program this Friday 04/15/22 and has made excellent progress. Functional capacity increased 34% as measured by 6MWT and strength increased 12% as measured by grip strength test. Patient plans to resume exercise at local gym 60 minutes: 30 mins walking (~3 miles), 30 mins stationary bike (~6 miles), 3 days/week.   Expected Outcomes Review goals up return to exercise. Progress workloads as tolerated to increase cardiorespiratory fitness. Continue daily exercise routine. Progress workloads and continue home exercise routine.  Patient will continue exercise at a local fitness center upon completion of the program.            Nutrition & Weight - Outcomes:  Pre Biometrics - 01/25/22 1315       Pre Biometrics   Waist Circumference 40 inches    Hip Circumference 42.5 inches    Waist to Hip Ratio 0.94 %    Triceps Skinfold 11 mm    % Body Fat 26.4 %    Grip Strength 32 kg    Flexibility 17.5 in    Single Leg Stand 3.12 seconds             Post Biometrics - 04/13/22 1140        Post  Biometrics   Waist Circumference 39 inches    Hip Circumference 42 inches    Waist to Hip Ratio 0.93  %    Triceps Skinfold 11 mm    Grip Strength 36 kg    Flexibility 17.63 in    Single Leg Stand 6.06 seconds             Nutrition:  Nutrition Therapy & Goals - 02/09/22 1325       Nutrition Therapy   Diet Heart Healthy Diet    Drug/Food Interactions Statins/Certain Fruits;Coumadin/Vit K      Personal Nutrition Goals   Nutrition Goal Patient to identify strategies for reducing cardiovascular risk by attending the [employer] education and nutrition series weekly    Personal Goal #2 Patient to identify strategies for weight gain of 0.5-2.0# per week    Personal Goal #3 Patient to improve diet quality by using the Plate method as guide for meal planning to include lean protein/plant protein, fruits, vegetables, whole grains, lowfat dairy as part of balanced diet    Comments Raul reports motivation to gain up to 205#; he reports losing lean muscle mass during cancer treatment in 2012. He continues 1 protein shake daily (35g protein); discussed alternative strategies for weight gain including protein/nutrition supplements, eating frequency, and increasing calories from fat (nuts/seeds, nut butters, etc). He did recently stop metformin due to blood sugar lows. Yojan will continue to benefit from participation in intensive cardiac rehab for nutrition education, exercise,and lifestyle modification.      Intervention Plan   Intervention Prescribe, educate and counsel regarding individualized specific dietary modifications aiming towards targeted core components such as weight, hypertension, lipid management, diabetes, heart failure and other comorbidities.;Nutrition handout(s) given to patient.    Expected Outcomes Short Term Goal: Understand basic principles of dietary content, such as calories, fat, sodium, cholesterol and nutrients.;Long Term Goal: Adherence to prescribed nutrition plan.             Nutrition Discharge:  Nutrition Assessments - 01/20/22 1624       Rate Your Plate  Scores   Pre Score 59             Education Questionnaire Score:  Knowledge Questionnaire Score - 01/20/22 1458       Knowledge Questionnaire Score   Pre Score 19/24             Goals reviewed with patient; copy given to patient.Pt graduates from  Intensive cardiac rehab program on 04/15/22 with completion of  23 exercise and  22 education sessions. Pt maintained good attendance and progressed nicely during their participation in rehab as evidenced by increased MET level.   Medication list reconciled. Repeat  PHQ score- 0 .  Pt has made significant lifestyle  changes and should be commended for their success. Savion achieved their goals during cardiac rehab.   Pt plans to continue exercise at the [facility] Latimer increased his distance on his post exercise walk test by 461 feet. We are proud of Geordie's progress![name] RN BSN

## 2022-04-21 ENCOUNTER — Other Ambulatory Visit: Payer: Self-pay | Admitting: Internal Medicine

## 2022-04-26 ENCOUNTER — Other Ambulatory Visit: Payer: Self-pay | Admitting: Internal Medicine

## 2022-04-26 ENCOUNTER — Ambulatory Visit: Payer: Medicare Other | Attending: Thoracic Surgery (Cardiothoracic Vascular Surgery) | Admitting: *Deleted

## 2022-04-26 DIAGNOSIS — Z8679 Personal history of other diseases of the circulatory system: Secondary | ICD-10-CM

## 2022-04-26 DIAGNOSIS — Z9889 Other specified postprocedural states: Secondary | ICD-10-CM | POA: Diagnosis not present

## 2022-04-26 DIAGNOSIS — I480 Paroxysmal atrial fibrillation: Secondary | ICD-10-CM

## 2022-04-26 LAB — POCT INR: POC INR: 3.6

## 2022-04-26 NOTE — Patient Instructions (Signed)
Description   Hold warfarin today and continue taking warfarin 3 tablets daily except 2.5 tablets each Sundays, Tuesdays, and Thursdays. Repeat INR in 3 weeks. Anticoagulation Clinic [phone]

## 2022-04-29 DIAGNOSIS — Q141 Congenital malformation of retina: Secondary | ICD-10-CM | POA: Diagnosis not present

## 2022-04-29 DIAGNOSIS — H524 Presbyopia: Secondary | ICD-10-CM | POA: Diagnosis not present

## 2022-04-29 DIAGNOSIS — H01021 Squamous blepharitis right upper eyelid: Secondary | ICD-10-CM | POA: Diagnosis not present

## 2022-04-29 DIAGNOSIS — H40023 Open angle with borderline findings, high risk, bilateral: Secondary | ICD-10-CM | POA: Diagnosis not present

## 2022-04-29 DIAGNOSIS — E119 Type 2 diabetes mellitus without complications: Secondary | ICD-10-CM | POA: Diagnosis not present

## 2022-04-29 LAB — HM DIABETES EYE EXAM

## 2022-05-03 ENCOUNTER — Encounter: Payer: Self-pay | Admitting: Internal Medicine

## 2022-05-03 DIAGNOSIS — G4733 Obstructive sleep apnea (adult) (pediatric): Secondary | ICD-10-CM | POA: Diagnosis not present

## 2022-05-09 ENCOUNTER — Other Ambulatory Visit: Payer: Self-pay | Admitting: *Deleted

## 2022-05-09 DIAGNOSIS — I480 Paroxysmal atrial fibrillation: Secondary | ICD-10-CM

## 2022-05-09 MED ORDER — WARFARIN SODIUM 2.5 MG PO TABS: ORAL_TABLET | ORAL | 1 refills | Status: DC

## 2022-05-09 NOTE — Telephone Encounter (Signed)
Prescription refill request received for warfarin Lov: Dick, 03/18/2022 Next INR check: 4/16 Warfarin tablet strength: 2.5 mg

## 2022-05-10 ENCOUNTER — Encounter: Payer: Self-pay | Admitting: Internal Medicine

## 2022-05-10 ENCOUNTER — Ambulatory Visit (INDEPENDENT_AMBULATORY_CARE_PROVIDER_SITE_OTHER): Payer: Medicare Other | Admitting: Internal Medicine

## 2022-05-10 VITALS — BP 118/80 | HR 60 | Temp 98.7°F | Ht 69.5 in | Wt 191.0 lb

## 2022-05-10 DIAGNOSIS — I2510 Atherosclerotic heart disease of native coronary artery without angina pectoris: Secondary | ICD-10-CM | POA: Diagnosis not present

## 2022-05-10 DIAGNOSIS — I4891 Unspecified atrial fibrillation: Secondary | ICD-10-CM | POA: Diagnosis not present

## 2022-05-10 DIAGNOSIS — K2100 Gastro-esophageal reflux disease with esophagitis, without bleeding: Secondary | ICD-10-CM | POA: Diagnosis not present

## 2022-05-10 DIAGNOSIS — I10 Essential (primary) hypertension: Secondary | ICD-10-CM

## 2022-05-10 DIAGNOSIS — R55 Syncope and collapse: Secondary | ICD-10-CM

## 2022-05-10 DIAGNOSIS — Z951 Presence of aortocoronary bypass graft: Secondary | ICD-10-CM

## 2022-05-10 DIAGNOSIS — I2583 Coronary atherosclerosis due to lipid rich plaque: Secondary | ICD-10-CM

## 2022-05-10 DIAGNOSIS — C160 Malignant neoplasm of cardia: Secondary | ICD-10-CM

## 2022-05-10 DIAGNOSIS — I480 Paroxysmal atrial fibrillation: Secondary | ICD-10-CM | POA: Diagnosis not present

## 2022-05-10 DIAGNOSIS — R600 Localized edema: Secondary | ICD-10-CM | POA: Diagnosis not present

## 2022-05-10 DIAGNOSIS — Z9889 Other specified postprocedural states: Secondary | ICD-10-CM

## 2022-05-10 DIAGNOSIS — E1159 Type 2 diabetes mellitus with other circulatory complications: Secondary | ICD-10-CM | POA: Diagnosis not present

## 2022-05-10 LAB — CBC WITH DIFFERENTIAL/PLATELET
Basophils Absolute: 0.1 10*3/uL (ref 0.0–0.1)
Basophils Relative: 0.8 % (ref 0.0–3.0)
Eosinophils Absolute: 0.3 10*3/uL (ref 0.0–0.7)
Eosinophils Relative: 4.4 % (ref 0.0–5.0)
HCT: 36.1 % — ABNORMAL LOW (ref 39.0–52.0)
Hemoglobin: 11.8 g/dL — ABNORMAL LOW (ref 13.0–17.0)
Lymphocytes Relative: 24.5 % (ref 12.0–46.0)
Lymphs Abs: 1.8 10*3/uL (ref 0.7–4.0)
MCHC: 32.7 g/dL (ref 30.0–36.0)
MCV: 78.4 fl (ref 78.0–100.0)
Monocytes Absolute: 0.9 10*3/uL (ref 0.1–1.0)
Monocytes Relative: 12.7 % — ABNORMAL HIGH (ref 3.0–12.0)
Neutro Abs: 4.1 10*3/uL (ref 1.4–7.7)
Neutrophils Relative %: 57.6 % (ref 43.0–77.0)
Platelets: 339 10*3/uL (ref 150.0–400.0)
RBC: 4.61 Mil/uL (ref 4.22–5.81)
RDW: 18 % — ABNORMAL HIGH (ref 11.5–15.5)
WBC: 7.2 10*3/uL (ref 4.0–10.5)

## 2022-05-10 LAB — COMPREHENSIVE METABOLIC PANEL
ALT: 20 U/L (ref 0–53)
AST: 35 U/L (ref 0–37)
Albumin: 4.2 g/dL (ref 3.5–5.2)
Alkaline Phosphatase: 76 U/L (ref 39–117)
BUN: 22 mg/dL (ref 6–23)
CO2: 25 mEq/L (ref 19–32)
Calcium: 9.2 mg/dL (ref 8.4–10.5)
Chloride: 105 mEq/L (ref 96–112)
Creatinine, Ser: 1.14 mg/dL (ref 0.40–1.50)
GFR: 62.29 mL/min (ref >60.00)
Glucose, Bld: 90 mg/dL (ref 70–99)
Potassium: 4 mEq/L (ref 3.5–5.1)
Sodium: 138 mEq/L (ref 135–145)
Total Bilirubin: 0.7 mg/dL (ref 0.2–1.2)
Total Protein: 6.8 g/dL (ref 6.0–8.3)

## 2022-05-10 LAB — TSH: TSH: 0.01 u[IU]/mL — ABNORMAL LOW (ref 0.35–5.50)

## 2022-05-10 LAB — HEMOGLOBIN A1C: Hgb A1c MFr Bld: 6.7 % — ABNORMAL HIGH (ref 4.6–6.5)

## 2022-05-10 NOTE — Assessment & Plan Note (Signed)
Cont on Prilosec ?

## 2022-05-10 NOTE — Assessment & Plan Note (Signed)
[name] ring

## 2022-05-10 NOTE — Assessment & Plan Note (Signed)
F/u w/Dr Truett Perna, Dr Marina Goodell

## 2022-05-10 NOTE — Assessment & Plan Note (Signed)
No angina Cont on ASA, Lipitor S/p CABG x3 on Sept 22, 2023

## 2022-05-10 NOTE — Progress Notes (Signed)
Subjective:  Patient ID: Jon [name], male    DOB: 03/24/1945  Age: 77 y.o. MRN: [id number]  CC: Follow-up (3 [facility] F/U)   HPI Jon SlatesHenry C [name] presents for dyslipidemia, DM, cancer f/u  Outpatient Medications Prior to Visit  Medication Sig Dispense Refill   acetaminophen (TYLENOL) 500 MG tablet Take 500-1,000 mg by mouth daily as needed for headache or mild pain.     aspirin EC 81 MG tablet Take 1 tablet (81 mg total) by mouth daily. Swallow whole. 30 tablet 12   atorvastatin (LIPITOR) 80 MG tablet Take 1 tablet (80 mg total) by mouth daily. 90 tablet 3   Cholecalciferol (VITAMIN D-3 PO) Take 1 capsule by mouth daily.     EPINEPHrine 0.3 mg/0.3 mL IJ SOAJ injection Inject 0.3 mg into the muscle as needed for anaphylaxis. 1 each 0   Multiple Vitamin (MULTIVITAMIN WITH MINERALS) TABS tablet Take 1 tablet by mouth 3 (three) times a week.     Multiple Vitamins-Minerals (MULTIVITAMIN ADULTS 50+ PO) Take 1 tablet by mouth daily.     nateglinide (STARLIX) 120 MG tablet TAKE 1 TABLET(120 MG) BY MOUTH THREE TIMES DAILY WITH MEALS 270 tablet 1   omeprazole (PRILOSEC) 40 MG capsule TAKE 1 CAPSULE BY MOUTH DAILY 90 capsule 3   spironolactone (ALDACTONE) 25 MG tablet Take 1 tablet (25 mg total) by mouth daily. (Patient taking differently: Take 12.5 mg by mouth daily.) 30 tablet 3   torsemide (DEMADEX) 10 MG tablet Take 1 tablet (10 mg total) by mouth daily. 30 tablet 3   warfarin (COUMADIN) 2.5 MG tablet TAKE 2.5 TABLETS TO 3 TABLETS BY MOUTH DAILY OR AS DIRECTED BY COUMADIN CLINIC. 90 tablet 1   No facility-administered medications prior to visit.    ROS: Review of Systems  Constitutional:  Negative for appetite change, fatigue and unexpected weight change.  HENT:  Negative for congestion, nosebleeds, sneezing, sore throat and trouble swallowing.   Eyes:  Negative for itching and visual disturbance.  Respiratory:  Negative for cough.   Cardiovascular:  Negative for chest pain,  palpitations and leg swelling.  Gastrointestinal:  Negative for abdominal distention, blood in stool, diarrhea and nausea.  Genitourinary:  Negative for frequency and hematuria.  Musculoskeletal:  Negative for back pain, gait problem, joint swelling and neck pain.  Skin:  Negative for rash.  Neurological:  Negative for dizziness, tremors, speech difficulty and weakness.  Psychiatric/Behavioral:  Negative for agitation, dysphoric mood and sleep disturbance. The patient is not nervous/anxious.     Objective:  BP 118/80 (BP Location: Right Arm, Patient Position: Sitting, Cuff Size: Normal)   Pulse 60   Temp 98.7 F (37.1 C) (Oral)   Ht 5' 9.5" (1.765 m)   Wt 191 lb (86.6 kg)   SpO2 94%   BMI 27.80 kg/m   BP Readings from Last 3 Encounters:  05/10/22 118/80  04/15/22 94/62  03/18/22 100/64    Wt Readings from Last 3 Encounters:  05/10/22 191 lb (86.6 kg)  04/15/22 190 lb 11.2 oz (86.5 kg)  03/18/22 192 lb 3.2 oz (87.2 kg)    Physical Exam Constitutional:      General: He is not in acute distress.    Appearance: He is well-developed. He is not diaphoretic.     Comments: NAD  HENT:     Head: Normocephalic and atraumatic.     Right Ear: External ear normal.     Left Ear: External ear normal.     Nose: Nose  normal.     Mouth/Throat:     Pharynx: No oropharyngeal exudate.  Eyes:     General: No scleral icterus.       Right eye: No discharge.        Left eye: No discharge.     Conjunctiva/sclera: Conjunctivae normal.     Pupils: Pupils are equal, round, and reactive to light.  Neck:     Thyroid: No thyromegaly.     Vascular: No JVD.     Trachea: No tracheal deviation.  Cardiovascular:     Rate and Rhythm: Normal rate and regular rhythm.     Heart sounds: Normal heart sounds. No murmur heard.    No friction rub. No gallop.  Pulmonary:     Effort: Pulmonary effort is normal. No respiratory distress.     Breath sounds: Normal breath sounds. No stridor. No wheezing or  rales.  Chest:     Chest wall: No tenderness.  Abdominal:     General: Bowel sounds are normal. There is no distension.     Palpations: Abdomen is soft. There is no mass.     Tenderness: There is no abdominal tenderness. There is no guarding or rebound.  Genitourinary:    Penis: No tenderness.   Musculoskeletal:        General: No tenderness. Normal range of motion.     Cervical back: Normal range of motion and neck supple.  Lymphadenopathy:     Cervical: No cervical adenopathy.  Skin:    General: Skin is warm and dry.     Coloration: Skin is not pale.     Findings: No erythema or rash.  Neurological:     Mental Status: He is alert and oriented to person, place, and time.     Cranial Nerves: No cranial nerve deficit.     Motor: No abnormal muscle tone.     Coordination: Coordination normal.     Gait: Gait normal.     Deep Tendon Reflexes: Reflexes are normal and symmetric. Reflexes normal.  Psychiatric:        Behavior: Behavior normal.        Thought Content: Thought content normal.        Judgment: Judgment normal.     Lab Results  Component Value Date   WBC 11.2 (H) 11/01/2021   HGB 8.2 (L) 11/01/2021   HCT 25.1 (L) 11/01/2021   PLT 652 (H) 11/01/2021   GLUCOSE 145 (H) 11/02/2021   CHOL 135 10/14/2021   TRIG 164 (H) 10/14/2021   HDL 42 10/14/2021   LDLDIRECT 30.0 11/10/2020   LDLCALC 60 10/14/2021   ALT 29 10/21/2021   AST 30 10/21/2021   NA 138 11/02/2021   K 3.8 11/02/2021   CL 107 11/02/2021   CREATININE 1.28 (H) 11/02/2021   BUN 14 11/02/2021   CO2 23 11/02/2021   TSH 0.466 10/13/2021   PSA 1.38 06/10/2019   INR 3.6 04/26/2022   HGBA1C 7.1 (H) 09/02/2021    No results found.  Assessment & Plan:   Problem List Items Addressed This Visit       Cardiovascular and Mediastinum   Atrial fibrillation    On ASA, Coumadin In NSR      Coronary artery disease - Primary    No angina Cont on ASA, Lipitor S/p CABG x3 on Sept 22, 2023       Hypertension    On Spironolactone        Digestive   Gastroesophageal cancer  F/u w/Dr Truett Perna, Dr Marina Goodell      GERD    Cont on Prilosec        Other   S/P MVR (mitral valve repair)    Melida Quitter -Adams ring         No orders of the defined types were placed in this encounter.     Follow-up: No follow-ups on file.  Sonda Primes, MD

## 2022-05-10 NOTE — Assessment & Plan Note (Signed)
On Spironolactone 

## 2022-05-10 NOTE — Assessment & Plan Note (Signed)
On ASA, Coumadin In NSR 

## 2022-05-11 ENCOUNTER — Other Ambulatory Visit (INDEPENDENT_AMBULATORY_CARE_PROVIDER_SITE_OTHER): Payer: Medicare Other

## 2022-05-11 DIAGNOSIS — R7989 Other specified abnormal findings of blood chemistry: Secondary | ICD-10-CM | POA: Diagnosis not present

## 2022-05-11 DIAGNOSIS — E0590 Thyrotoxicosis, unspecified without thyrotoxic crisis or storm: Secondary | ICD-10-CM

## 2022-05-11 LAB — T4, FREE: Free T4: 1.65 ng/dL — ABNORMAL HIGH (ref 0.60–1.60)

## 2022-05-17 ENCOUNTER — Ambulatory Visit: Payer: Medicare Other | Attending: Thoracic Surgery (Cardiothoracic Vascular Surgery) | Admitting: *Deleted

## 2022-05-17 DIAGNOSIS — I059 Rheumatic mitral valve disease, unspecified: Secondary | ICD-10-CM

## 2022-05-17 DIAGNOSIS — Z7901 Long term (current) use of anticoagulants: Secondary | ICD-10-CM | POA: Diagnosis not present

## 2022-05-17 DIAGNOSIS — I480 Paroxysmal atrial fibrillation: Secondary | ICD-10-CM | POA: Diagnosis not present

## 2022-05-17 DIAGNOSIS — E0590 Thyrotoxicosis, unspecified without thyrotoxic crisis or storm: Secondary | ICD-10-CM | POA: Insufficient documentation

## 2022-05-17 DIAGNOSIS — Z9889 Other specified postprocedural states: Secondary | ICD-10-CM

## 2022-05-17 DIAGNOSIS — I4891 Unspecified atrial fibrillation: Secondary | ICD-10-CM

## 2022-05-17 DIAGNOSIS — Z8679 Personal history of other diseases of the circulatory system: Secondary | ICD-10-CM | POA: Diagnosis not present

## 2022-05-17 LAB — POCT INR: INR: 3 (ref 2.0–3.0)

## 2022-05-17 NOTE — Assessment & Plan Note (Signed)
New.  Endocrinology referral Monitor TSH/FT4

## 2022-05-17 NOTE — Patient Instructions (Signed)
Description   Continue taking warfarin 3 tablets daily except 2.5 tablets each Sundays, Tuesdays, and Thursdays. Repeat INR in 4 weeks. Anticoagulation Clinic [phone]     

## 2022-06-08 ENCOUNTER — Inpatient Hospital Stay: Payer: Medicare Other | Attending: Oncology

## 2022-06-08 DIAGNOSIS — C169 Malignant neoplasm of stomach, unspecified: Secondary | ICD-10-CM | POA: Diagnosis not present

## 2022-06-08 LAB — CEA (ACCESS): CEA (CHCC): 1.23 ng/mL (ref 0.00–5.00)

## 2022-06-09 ENCOUNTER — Telehealth: Payer: Self-pay

## 2022-06-09 NOTE — Telephone Encounter (Signed)
-----   Message from Ladene Artist, MD sent at 06/08/2022  6:41 PM EDT ----- Please call patient, the CEA is normal, follow-up as scheduled

## 2022-06-09 NOTE — Telephone Encounter (Signed)
I contacted the patient by phone and informed him that his CEA levels are within the normal range. I advised him to reach out if he had any further inquiries.

## 2022-06-14 ENCOUNTER — Ambulatory Visit: Payer: Medicare Other | Attending: Cardiology | Admitting: *Deleted

## 2022-06-14 DIAGNOSIS — Z5181 Encounter for therapeutic drug level monitoring: Secondary | ICD-10-CM

## 2022-06-14 DIAGNOSIS — I480 Paroxysmal atrial fibrillation: Secondary | ICD-10-CM | POA: Diagnosis not present

## 2022-06-14 DIAGNOSIS — Z8679 Personal history of other diseases of the circulatory system: Secondary | ICD-10-CM

## 2022-06-14 DIAGNOSIS — Z9889 Other specified postprocedural states: Secondary | ICD-10-CM

## 2022-06-14 LAB — POCT INR: POC INR: 3.4

## 2022-06-14 NOTE — Patient Instructions (Signed)
Description   Hold warfarin today, then START  taking warfarin 3 tablets daily except 2  tablets each Sundays, Tuesdays, and Thursdays. Repeat INR in 3 weeks. [facility] [phone]

## 2022-06-15 DIAGNOSIS — D6869 Other thrombophilia: Secondary | ICD-10-CM | POA: Diagnosis not present

## 2022-06-15 DIAGNOSIS — Z7982 Long term (current) use of aspirin: Secondary | ICD-10-CM | POA: Diagnosis not present

## 2022-07-05 ENCOUNTER — Ambulatory Visit: Payer: Medicare Other | Attending: Thoracic Surgery (Cardiothoracic Vascular Surgery) | Admitting: *Deleted

## 2022-07-05 DIAGNOSIS — Z5181 Encounter for therapeutic drug level monitoring: Secondary | ICD-10-CM | POA: Diagnosis not present

## 2022-07-05 DIAGNOSIS — Z8679 Personal history of other diseases of the circulatory system: Secondary | ICD-10-CM | POA: Diagnosis not present

## 2022-07-05 DIAGNOSIS — Z9889 Other specified postprocedural states: Secondary | ICD-10-CM

## 2022-07-05 DIAGNOSIS — I480 Paroxysmal atrial fibrillation: Secondary | ICD-10-CM | POA: Diagnosis not present

## 2022-07-05 LAB — POCT INR: POC INR: 2.6

## 2022-07-05 NOTE — Patient Instructions (Signed)
Description   Continue taking warfarin 3 tablets daily except 2  tablets each Sundays, Tuesdays, and Thursdays. Repeat INR in 4 weeks. [facility] [phone]

## 2022-07-12 ENCOUNTER — Other Ambulatory Visit: Payer: Self-pay | Admitting: Nurse Practitioner

## 2022-07-12 DIAGNOSIS — I480 Paroxysmal atrial fibrillation: Secondary | ICD-10-CM

## 2022-07-12 NOTE — Telephone Encounter (Signed)
Prescription refill request received for warfarin Lov: 03/18/22 Jon [name])  Next INR check: 08/02/22 Warfarin tablet strength: 2.5mg  Appropriate dose. Refill sent.

## 2022-07-19 DIAGNOSIS — G4733 Obstructive sleep apnea (adult) (pediatric): Secondary | ICD-10-CM | POA: Diagnosis not present

## 2022-08-02 ENCOUNTER — Ambulatory Visit: Payer: Medicare Other | Attending: Thoracic Surgery (Cardiothoracic Vascular Surgery)

## 2022-08-02 DIAGNOSIS — I480 Paroxysmal atrial fibrillation: Secondary | ICD-10-CM

## 2022-08-02 DIAGNOSIS — Z8679 Personal history of other diseases of the circulatory system: Secondary | ICD-10-CM

## 2022-08-02 DIAGNOSIS — Z7901 Long term (current) use of anticoagulants: Secondary | ICD-10-CM

## 2022-08-02 DIAGNOSIS — I059 Rheumatic mitral valve disease, unspecified: Secondary | ICD-10-CM

## 2022-08-02 DIAGNOSIS — Z9889 Other specified postprocedural states: Secondary | ICD-10-CM

## 2022-08-02 LAB — POCT INR: INR: 2.4 (ref 2.0–3.0)

## 2022-08-02 NOTE — Patient Instructions (Signed)
Continue taking warfarin 3 tablets daily except 2  tablets each Sundays, Tuesdays, and Thursdays. Repeat INR in 6 weeks. [facility]ticoagulation Clinic [phone]

## 2022-08-09 ENCOUNTER — Encounter: Payer: Self-pay | Admitting: Internal Medicine

## 2022-08-09 ENCOUNTER — Ambulatory Visit (INDEPENDENT_AMBULATORY_CARE_PROVIDER_SITE_OTHER): Payer: Medicare Other | Admitting: Internal Medicine

## 2022-08-09 VITALS — BP 110/60 | HR 67 | Temp 98.0°F | Ht 69.5 in | Wt 190.0 lb

## 2022-08-09 DIAGNOSIS — I2510 Atherosclerotic heart disease of native coronary artery without angina pectoris: Secondary | ICD-10-CM

## 2022-08-09 DIAGNOSIS — I480 Paroxysmal atrial fibrillation: Secondary | ICD-10-CM

## 2022-08-09 DIAGNOSIS — E1159 Type 2 diabetes mellitus with other circulatory complications: Secondary | ICD-10-CM

## 2022-08-09 DIAGNOSIS — E162 Hypoglycemia, unspecified: Secondary | ICD-10-CM

## 2022-08-09 DIAGNOSIS — Z9889 Other specified postprocedural states: Secondary | ICD-10-CM | POA: Diagnosis not present

## 2022-08-09 DIAGNOSIS — I2583 Coronary atherosclerosis due to lipid rich plaque: Secondary | ICD-10-CM | POA: Diagnosis not present

## 2022-08-09 DIAGNOSIS — R7309 Other abnormal glucose: Secondary | ICD-10-CM

## 2022-08-09 DIAGNOSIS — E0590 Thyrotoxicosis, unspecified without thyrotoxic crisis or storm: Secondary | ICD-10-CM

## 2022-08-09 LAB — T4, FREE: Free T4: 0.83 ng/dL (ref 0.60–1.60)

## 2022-08-09 LAB — COMPREHENSIVE METABOLIC PANEL
ALT: 17 U/L (ref 0–53)
AST: 26 U/L (ref 0–37)
Albumin: 4.3 g/dL (ref 3.5–5.2)
Alkaline Phosphatase: 78 U/L (ref 39–117)
BUN: 13 mg/dL (ref 6–23)
CO2: 29 mEq/L (ref 19–32)
Calcium: 9.5 mg/dL (ref 8.4–10.5)
Chloride: 103 mEq/L (ref 96–112)
Creatinine, Ser: 1 mg/dL (ref 0.40–1.50)
GFR: 72.77 mL/min (ref >60.00)
Glucose, Bld: 126 mg/dL — ABNORMAL HIGH (ref 70–99)
Potassium: 4.3 mEq/L (ref 3.5–5.1)
Sodium: 138 mEq/L (ref 135–145)
Total Bilirubin: 0.6 mg/dL (ref 0.2–1.2)
Total Protein: 7 g/dL (ref 6.0–8.3)

## 2022-08-09 LAB — TSH: TSH: 0.32 u[IU]/mL — ABNORMAL LOW (ref 0.35–5.50)

## 2022-08-09 LAB — HEMOGLOBIN A1C: Hgb A1c MFr Bld: 6.9 % — ABNORMAL HIGH (ref 4.6–6.5)

## 2022-08-09 MED ORDER — OMEPRAZOLE 40 MG PO CPDR: 40.0000 mg | DELAYED_RELEASE_CAPSULE | Freq: Every day | ORAL | 3 refills | Status: DC

## 2022-08-09 NOTE — Assessment & Plan Note (Signed)
S/p CABG x3 and MVR on Sept 22, 2023 On  Coumadin

## 2022-08-09 NOTE — Assessment & Plan Note (Signed)
No relapse 

## 2022-08-09 NOTE — Progress Notes (Signed)
Subjective:  Patient ID: Jon [name], male    DOB: [birth year]  Age: 77 y.o. MRN: [id number]  CC: Follow-up (3 mnth f/u, sciatic nerve issues in lt leg )   HPI Jon [name] presents for A fib, CAD, HTN S/p MVR - 90% back to nl C/o occasional CBG 62-67 ac  Outpatient Medications Prior to Visit  Medication Sig Dispense Refill   acetaminophen (TYLENOL) 500 MG tablet Take 500-1,000 mg by mouth daily as needed for headache or mild pain.     aspirin EC 81 MG tablet Take 1 tablet (81 mg total) by mouth daily. Swallow whole. 30 tablet 12   atorvastatin (LIPITOR) 80 MG tablet Take 1 tablet (80 mg total) by mouth daily. 90 tablet 3   Cholecalciferol (VITAMIN D-3 PO) Take 1 capsule by mouth daily.     EPINEPHrine 0.3 mg/0.3 mL IJ SOAJ injection Inject 0.3 mg into the muscle as needed for anaphylaxis. 1 each 0   Multiple Vitamin (MULTIVITAMIN WITH MINERALS) TABS tablet Take 1 tablet by mouth 3 (three) times a week.     Multiple Vitamins-Minerals (MULTIVITAMIN ADULTS 50+ PO) Take 1 tablet by mouth daily.     nateglinide (STARLIX) 120 MG tablet TAKE 1 TABLET(120 MG) BY MOUTH THREE TIMES DAILY WITH MEALS 270 tablet 1   spironolactone (ALDACTONE) 25 MG tablet Take 1 tablet (25 mg total) by mouth daily. (Patient taking differently: Take 12.5 mg by mouth daily.) 30 tablet 3   torsemide (DEMADEX) 10 MG tablet Take 1 tablet (10 mg total) by mouth daily. 30 tablet 3   warfarin (COUMADIN) 2.5 MG tablet TAKE 2 AND 1/2 TO 3 TABLETS BY MOUTH DAILY AS DIRECTED BY COUMADIN CLINIC 90 tablet 1   omeprazole (PRILOSEC) 40 MG capsule TAKE 1 CAPSULE BY MOUTH DAILY 90 capsule 3   No facility-administered medications prior to visit.    ROS: Review of Systems  Constitutional:  Negative for appetite change, fatigue and unexpected weight change.  HENT:  Negative for congestion, nosebleeds, sneezing, sore throat and trouble swallowing.   Eyes:  Negative for itching and visual disturbance.  Respiratory:   Negative for cough.   Cardiovascular:  Negative for chest pain, palpitations and leg swelling.  Gastrointestinal:  Negative for abdominal distention, blood in stool, diarrhea and nausea.  Genitourinary:  Negative for frequency and hematuria.  Musculoskeletal:  Negative for back pain, gait problem, joint swelling and neck pain.  Skin:  Negative for rash.  Neurological:  Negative for dizziness, tremors, speech difficulty and weakness.  Psychiatric/Behavioral:  Negative for agitation, dysphoric mood and sleep disturbance. The patient is not nervous/anxious.     Objective:  BP 110/60 (BP Location: Left Arm, Patient Position: Sitting, Cuff Size: Large)   Pulse 67   Temp 98 F (36.7 C) (Oral)   Ht 5' 9.5" (1.765 m)   Wt 190 lb (86.2 kg)   SpO2 95%   BMI 27.66 kg/m   BP Readings from Last 3 Encounters:  08/09/22 110/60  05/10/22 118/80  04/15/22 94/62    Wt Readings from Last 3 Encounters:  08/09/22 190 lb (86.2 kg)  05/10/22 191 lb (86.6 kg)  04/15/22 190 lb 11.2 oz (86.5 kg)    Physical Exam Constitutional:      General: He is not in acute distress.    Appearance: Normal appearance. He is well-developed.     Comments: NAD  Eyes:     Conjunctiva/sclera: Conjunctivae normal.     Pupils: Pupils are equal, round, and  reactive to light.  Neck:     Thyroid: No thyromegaly.     Vascular: No JVD.  Cardiovascular:     Rate and Rhythm: Normal rate and regular rhythm.     Heart sounds: Normal heart sounds. No murmur heard.    No friction rub. No gallop.  Pulmonary:     Effort: Pulmonary effort is normal. No respiratory distress.     Breath sounds: Normal breath sounds. No wheezing or rales.  Chest:     Chest wall: No tenderness.  Abdominal:     General: Bowel sounds are normal. There is no distension.     Palpations: Abdomen is soft. There is no mass.     Tenderness: There is no abdominal tenderness. There is no guarding or rebound.  Musculoskeletal:        General: No  tenderness. Normal range of motion.     Cervical back: Normal range of motion.  Lymphadenopathy:     Cervical: No cervical adenopathy.  Skin:    General: Skin is warm and dry.     Findings: No rash.  Neurological:     Mental Status: He is alert and oriented to person, place, and time.     Cranial Nerves: No cranial nerve deficit.     Motor: No abnormal muscle tone.     Coordination: Coordination normal.     Gait: Gait normal.     Deep Tendon Reflexes: Reflexes are normal and symmetric.  Psychiatric:        Behavior: Behavior normal.        Thought Content: Thought content normal.        Judgment: Judgment normal.     Lab Results  Component Value Date   WBC 7.2 05/10/2022   HGB 11.8 (L) 05/10/2022   HCT 36.1 (L) 05/10/2022   PLT 339.0 05/10/2022   GLUCOSE 90 05/10/2022   CHOL 135 10/14/2021   TRIG 164 (H) 10/14/2021   HDL 42 10/14/2021   LDLDIRECT 30.0 11/10/2020   LDLCALC 60 10/14/2021   ALT 20 05/10/2022   AST 35 05/10/2022   NA 138 05/10/2022   K 4.0 05/10/2022   CL 105 05/10/2022   CREATININE 1.14 05/10/2022   BUN 22 05/10/2022   CO2 25 05/10/2022   TSH 0.01 Repeated and verified X2. (L) 05/10/2022   PSA 1.38 06/10/2019   INR 2.4 08/02/2022   HGBA1C 6.7 (H) 05/10/2022    No results found.  Assessment & Plan:   Problem List Items Addressed This Visit     Hypoglycemia    C/o occasional CBG 62-67 ac Monitor A1c Hold Starlix if low CBGs      PAF (paroxysmal atrial fibrillation) (HCC)    No relapse      Relevant Orders   Comprehensive metabolic panel   TSH   T4, free   Hemoglobin A1c   Diabetes mellitus, type 2 (HCC)    Monitor A1c      Relevant Orders   Comprehensive metabolic panel   TSH   T4, free   Hemoglobin A1c   Coronary artery disease    S/p CABG x3 on Sept 22, 2023 On ASA, Coumadin OnPrilosec      Relevant Orders   Comprehensive metabolic panel   TSH   T4, free   Hemoglobin A1c   S/P MVR (mitral valve repair) - Primary     S/p CABG x3 and MVR on Sept 22, 2023 On  Coumadin       Hyperthyroidism  New.  Endocrinology referral Monitor TSH/FT4         Meds ordered this encounter  Medications   omeprazole (PRILOSEC) 40 MG capsule    Sig: Take 1 capsule (40 mg total) by mouth daily.    Dispense:  90 capsule    Refill:  3      Follow-up: Return in about 4 months (around 12/10/2022) for a follow-up visit.  Sonda Primes, MD

## 2022-08-09 NOTE — Assessment & Plan Note (Signed)
S/p CABG x3 on Sept 22, 2023 On ASA, Coumadin OnPrilosec

## 2022-08-09 NOTE — Assessment & Plan Note (Signed)
Monitor A1c 

## 2022-08-09 NOTE — Assessment & Plan Note (Signed)
New.  Endocrinology referral Monitor TSH/FT4 

## 2022-08-09 NOTE — Assessment & Plan Note (Addendum)
C/o occasional CBG 62-67 ac Monitor A1c Hold Starlix if low CBGs

## 2022-09-05 DIAGNOSIS — K080 Exfoliation of teeth due to systemic causes: Secondary | ICD-10-CM | POA: Diagnosis not present

## 2022-09-07 ENCOUNTER — Other Ambulatory Visit: Payer: Self-pay | Admitting: Internal Medicine

## 2022-09-07 DIAGNOSIS — I480 Paroxysmal atrial fibrillation: Secondary | ICD-10-CM

## 2022-09-08 ENCOUNTER — Ambulatory Visit: Payer: Medicare Other | Admitting: Internal Medicine

## 2022-09-08 NOTE — Telephone Encounter (Signed)
Warfarin 2.5mg  refill Afib Last INR 08/02/22 Last OV 03/18/22

## 2022-09-13 ENCOUNTER — Ambulatory Visit: Payer: Medicare Other

## 2022-09-13 DIAGNOSIS — Z9889 Other specified postprocedural states: Secondary | ICD-10-CM

## 2022-09-13 DIAGNOSIS — Z8679 Personal history of other diseases of the circulatory system: Secondary | ICD-10-CM | POA: Diagnosis not present

## 2022-09-13 DIAGNOSIS — Z7901 Long term (current) use of anticoagulants: Secondary | ICD-10-CM | POA: Diagnosis not present

## 2022-09-13 DIAGNOSIS — I480 Paroxysmal atrial fibrillation: Secondary | ICD-10-CM

## 2022-09-13 DIAGNOSIS — I059 Rheumatic mitral valve disease, unspecified: Secondary | ICD-10-CM

## 2022-09-13 LAB — POCT INR: INR: 2 (ref 2.0–3.0)

## 2022-09-13 NOTE — Patient Instructions (Signed)
Continue taking warfarin 3 tablets daily except 2  tablets each Sundays, Tuesdays, and Thursdays. Repeat INR in 6 weeks. [facility]ticoagulation Clinic [phone]

## 2022-10-26 ENCOUNTER — Ambulatory Visit: Payer: Medicare Other | Attending: Cardiovascular Disease | Admitting: *Deleted

## 2022-10-26 DIAGNOSIS — I480 Paroxysmal atrial fibrillation: Secondary | ICD-10-CM | POA: Diagnosis not present

## 2022-10-26 DIAGNOSIS — I059 Rheumatic mitral valve disease, unspecified: Secondary | ICD-10-CM

## 2022-10-26 DIAGNOSIS — Z9889 Other specified postprocedural states: Secondary | ICD-10-CM | POA: Diagnosis not present

## 2022-10-26 DIAGNOSIS — Z7901 Long term (current) use of anticoagulants: Secondary | ICD-10-CM | POA: Diagnosis not present

## 2022-10-26 DIAGNOSIS — Z8679 Personal history of other diseases of the circulatory system: Secondary | ICD-10-CM | POA: Diagnosis not present

## 2022-10-26 DIAGNOSIS — I4891 Unspecified atrial fibrillation: Secondary | ICD-10-CM | POA: Diagnosis not present

## 2022-10-26 LAB — POCT INR: INR: 2.2 (ref 2.0–3.0)

## 2022-10-26 NOTE — Patient Instructions (Signed)
Description   Continue taking warfarin 3 tablets daily except 2 tablets each Sundays, Tuesdays, and Thursdays. Repeat INR in 6 weeks. [facility]ticoagulation Clinic [phone]

## 2022-12-08 ENCOUNTER — Ambulatory Visit: Payer: Medicare Other | Attending: Cardiology

## 2022-12-08 DIAGNOSIS — Z8679 Personal history of other diseases of the circulatory system: Secondary | ICD-10-CM | POA: Diagnosis not present

## 2022-12-08 DIAGNOSIS — I480 Paroxysmal atrial fibrillation: Secondary | ICD-10-CM | POA: Diagnosis not present

## 2022-12-08 DIAGNOSIS — Z9889 Other specified postprocedural states: Secondary | ICD-10-CM

## 2022-12-08 LAB — POCT INR: INR: 2.2 (ref 2.0–3.0)

## 2022-12-08 NOTE — Patient Instructions (Signed)
Description   Continue taking warfarin 3 tablets daily except 2 tablets each Sundays, Tuesdays, and Thursdays. Repeat INR in 6 weeks. [facility]ticoagulation Clinic [phone]

## 2022-12-10 ENCOUNTER — Other Ambulatory Visit: Payer: Self-pay | Admitting: Cardiovascular Disease

## 2022-12-10 DIAGNOSIS — I480 Paroxysmal atrial fibrillation: Secondary | ICD-10-CM

## 2022-12-11 DIAGNOSIS — G4733 Obstructive sleep apnea (adult) (pediatric): Secondary | ICD-10-CM | POA: Diagnosis not present

## 2022-12-12 ENCOUNTER — Encounter: Payer: Self-pay | Admitting: Internal Medicine

## 2022-12-12 ENCOUNTER — Ambulatory Visit: Payer: Medicare Other | Admitting: Internal Medicine

## 2022-12-12 VITALS — BP 110/68 | HR 54 | Temp 98.6°F | Ht 69.5 in | Wt 200.0 lb

## 2022-12-12 DIAGNOSIS — E1159 Type 2 diabetes mellitus with other circulatory complications: Secondary | ICD-10-CM | POA: Diagnosis not present

## 2022-12-12 DIAGNOSIS — E785 Hyperlipidemia, unspecified: Secondary | ICD-10-CM | POA: Diagnosis not present

## 2022-12-12 DIAGNOSIS — Z7984 Long term (current) use of oral hypoglycemic drugs: Secondary | ICD-10-CM

## 2022-12-12 DIAGNOSIS — I480 Paroxysmal atrial fibrillation: Secondary | ICD-10-CM

## 2022-12-12 DIAGNOSIS — C169 Malignant neoplasm of stomach, unspecified: Secondary | ICD-10-CM | POA: Diagnosis not present

## 2022-12-12 DIAGNOSIS — Z7901 Long term (current) use of anticoagulants: Secondary | ICD-10-CM

## 2022-12-12 LAB — COMPREHENSIVE METABOLIC PANEL
ALT: 14 U/L (ref 0–53)
AST: 24 U/L (ref 0–37)
Albumin: 4.3 g/dL (ref 3.5–5.2)
Alkaline Phosphatase: 63 U/L (ref 39–117)
BUN: 17 mg/dL (ref 6–23)
CO2: 29 meq/L (ref 19–32)
Calcium: 9.5 mg/dL (ref 8.4–10.5)
Chloride: 103 meq/L (ref 96–112)
Creatinine, Ser: 1.16 mg/dL (ref 0.40–1.50)
GFR: 60.75 mL/min (ref >60.00)
Glucose, Bld: 107 mg/dL — ABNORMAL HIGH (ref 70–99)
Potassium: 5 meq/L (ref 3.5–5.1)
Sodium: 138 meq/L (ref 135–145)
Total Bilirubin: 0.8 mg/dL (ref 0.2–1.2)
Total Protein: 7.1 g/dL (ref 6.0–8.3)

## 2022-12-12 LAB — TSH: TSH: 0.52 u[IU]/mL (ref 0.35–5.50)

## 2022-12-12 LAB — T4, FREE: Free T4: 1.04 ng/dL (ref 0.60–1.60)

## 2022-12-12 LAB — HEMOGLOBIN A1C: Hgb A1c MFr Bld: 7 % — ABNORMAL HIGH (ref 4.6–6.5)

## 2022-12-12 MED ORDER — NATEGLINIDE 60 MG PO TABS: 60.0000 mg | ORAL_TABLET | Freq: Three times a day (TID) | ORAL | 3 refills | Status: DC

## 2022-12-12 NOTE — Assessment & Plan Note (Signed)
Cont on Metformin, Starlix Cont on Lipitor, fenofibrate

## 2022-12-12 NOTE — Progress Notes (Signed)
Subjective:  Patient ID: Jon [name], male    DOB: 03/29/1945  Age: 77 y.o. MRN: [id number]  CC: Medical Management of Chronic Issues (4 MNTH F/U)   HPI Jon [name] presents for PAF, MVR, DM C/o occ low CBG - 80s in am, rare  Outpatient Medications Prior to Visit  Medication Sig Dispense Refill   acetaminophen (TYLENOL) 500 MG tablet Take 500-1,000 mg by mouth daily as needed for headache or mild pain.     aspirin EC 81 MG tablet Take 1 tablet (81 mg total) by mouth daily. Swallow whole. 30 tablet 12   atorvastatin (LIPITOR) 80 MG tablet Take 1 tablet (80 mg total) by mouth daily. 90 tablet 3   Cholecalciferol (VITAMIN D-3 PO) Take 1 capsule by mouth daily.     EPINEPHrine 0.3 mg/0.3 mL IJ SOAJ injection Inject 0.3 mg into the muscle as needed for anaphylaxis. 1 each 0   Multiple Vitamin (MULTIVITAMIN WITH MINERALS) TABS tablet Take 1 tablet by mouth 3 (three) times a week.     Multiple Vitamins-Minerals (MULTIVITAMIN ADULTS 50+ PO) Take 1 tablet by mouth daily.     omeprazole (PRILOSEC) 40 MG capsule Take 1 capsule (40 mg total) by mouth daily. 90 capsule 3   spironolactone (ALDACTONE) 25 MG tablet Take 1 tablet (25 mg total) by mouth daily. (Patient taking differently: Take 12.5 mg by mouth daily.) 30 tablet 3   torsemide (DEMADEX) 10 MG tablet Take 1 tablet (10 mg total) by mouth daily. 30 tablet 3   warfarin (COUMADIN) 2.5 MG tablet TAKE 2 TO 3 TABLETS BY MOUTH DAILY AS DIRECTED BY COUMADIN CLINIC 100 tablet 1   nateglinide (STARLIX) 120 MG tablet TAKE 1 TABLET(120 MG) BY MOUTH THREE TIMES DAILY WITH MEALS 270 tablet 1   No facility-administered medications prior to visit.    ROS: Review of Systems  Constitutional:  Negative for appetite change, fatigue and unexpected weight change.  HENT:  Negative for congestion, nosebleeds, sneezing, sore throat and trouble swallowing.   Eyes:  Negative for itching and visual disturbance.  Respiratory:  Negative for cough.    Cardiovascular:  Negative for chest pain, palpitations and leg swelling.  Gastrointestinal:  Negative for abdominal distention, blood in stool, diarrhea and nausea.  Genitourinary:  Negative for frequency and hematuria.  Musculoskeletal:  Negative for back pain, gait problem, joint swelling and neck pain.  Skin:  Negative for rash.  Neurological:  Negative for dizziness, tremors, speech difficulty and weakness.  Psychiatric/Behavioral:  Negative for agitation, dysphoric mood, sleep disturbance and suicidal ideas. The patient is not nervous/anxious.     Objective:  BP 110/68 (BP Location: Left Arm, Patient Position: Sitting, Cuff Size: Normal)   Pulse (!) 54   Temp 98.6 F (37 C) (Oral)   Ht 5' 9.5" (1.765 m)   Wt 200 lb (90.7 kg)   SpO2 96%   BMI 29.11 kg/m   BP Readings from Last 3 Encounters:  12/12/22 110/68  08/09/22 110/60  05/10/22 118/80    Wt Readings from Last 3 Encounters:  12/12/22 200 lb (90.7 kg)  08/09/22 190 lb (86.2 kg)  05/10/22 191 lb (86.6 kg)    Physical Exam Constitutional:      General: He is not in acute distress.    Appearance: He is well-developed.     Comments: NAD  Eyes:     Conjunctiva/sclera: Conjunctivae normal.     Pupils: Pupils are equal, round, and reactive to light.  Neck:  Thyroid: No thyromegaly.     Vascular: No JVD.  Cardiovascular:     Rate and Rhythm: Normal rate and regular rhythm.     Heart sounds: Normal heart sounds. No murmur heard.    No friction rub. No gallop.  Pulmonary:     Effort: Pulmonary effort is normal. No respiratory distress.     Breath sounds: Normal breath sounds. No wheezing or rales.  Chest:     Chest wall: No tenderness.  Abdominal:     General: Bowel sounds are normal. There is no distension.     Palpations: Abdomen is soft. There is no mass.     Tenderness: There is no abdominal tenderness. There is no guarding or rebound.  Musculoskeletal:        General: No tenderness. Normal range of  motion.     Cervical back: Normal range of motion.  Lymphadenopathy:     Cervical: No cervical adenopathy.  Skin:    General: Skin is warm and dry.     Findings: No rash.  Neurological:     Mental Status: He is alert and oriented to person, place, and time.     Cranial Nerves: No cranial nerve deficit.     Motor: No abnormal muscle tone.     Coordination: Coordination normal.     Gait: Gait normal.     Deep Tendon Reflexes: Reflexes are normal and symmetric.  Psychiatric:        Behavior: Behavior normal.        Thought Content: Thought content normal.        Judgment: Judgment normal.     Lab Results  Component Value Date   WBC 7.2 05/10/2022   HGB 11.8 (L) 05/10/2022   HCT 36.1 (L) 05/10/2022   PLT 339.0 05/10/2022   GLUCOSE 126 (H) 08/09/2022   CHOL 135 10/14/2021   TRIG 164 (H) 10/14/2021   HDL 42 10/14/2021   LDLDIRECT 30.0 11/10/2020   LDLCALC 60 10/14/2021   ALT 17 08/09/2022   AST 26 08/09/2022   NA 138 08/09/2022   K 4.3 08/09/2022   CL 103 08/09/2022   CREATININE 1.00 08/09/2022   BUN 13 08/09/2022   CO2 29 08/09/2022   TSH 0.32 (L) 08/09/2022   PSA 1.38 06/10/2019   INR 2.2 12/08/2022   HGBA1C 6.9 (H) 08/09/2022    No results found.  Assessment & Plan:   Problem List Items Addressed This Visit     Dyslipidemia    Cont on Metformin, Starlix Cont on Lipitor, fenofibrate      Relevant Orders   Comprehensive metabolic panel   PAF (paroxysmal atrial fibrillation) (HCC)    No relapse On ASA, Coumadin      Relevant Orders   Comprehensive metabolic panel   T4, free   TSH   Primary adenocarcinoma of GE junction    F/u w/Dr Marina Goodell, Dr Truett Perna      Diabetes mellitus, type 2 (HCC) - Primary    C/o occ low CBG - 80s in am, rare Reduce Starlix to 60 mg tid prn      Relevant Medications   nateglinide (STARLIX) 60 MG tablet   Other Relevant Orders   Comprehensive metabolic panel   Hemoglobin A1c   Long term (current) use of anticoagulants     On ASA, Coumadin         Meds ordered this encounter  Medications   nateglinide (STARLIX) 60 MG tablet    Sig: Take 1 tablet (60  mg total) by mouth 3 (three) times daily with meals.    Dispense:  270 tablet    Refill:  3      Follow-up: Return in about 3 months (around 03/14/2023) for a follow-up visit.  Sonda Primes, MD

## 2022-12-12 NOTE — Assessment & Plan Note (Signed)
No relapse On ASA, Coumadin

## 2022-12-12 NOTE — Assessment & Plan Note (Signed)
F/u w/Dr Marina Goodell, Dr Truett Perna

## 2022-12-12 NOTE — Assessment & Plan Note (Signed)
On ASA, Coumadin

## 2022-12-12 NOTE — Assessment & Plan Note (Signed)
C/o occ low CBG - 80s in am, rare Reduce Starlix to 60 mg tid prn

## 2022-12-27 ENCOUNTER — Ambulatory Visit: Payer: Medicare Other | Attending: Internal Medicine | Admitting: Internal Medicine

## 2022-12-27 ENCOUNTER — Encounter: Payer: Self-pay | Admitting: Internal Medicine

## 2022-12-27 VITALS — BP 115/69 | HR 51 | Ht 70.0 in | Wt 200.0 lb

## 2022-12-27 DIAGNOSIS — E785 Hyperlipidemia, unspecified: Secondary | ICD-10-CM

## 2022-12-27 NOTE — Progress Notes (Unsigned)
Cardiology Office Note   Date:  12/27/2022   ID:  Jon [name], DOB 07/15/1945, MRN [id number]  PCP:  Tresa Garter, MD  Cardiologist:   Dietrich Pates, MD    Pt presents for follow up of CAD      History of Present Illness: Jon [name] is a 77 y.o. male with a history of CAD, HTN, PAF I saw him last in 2014    The pt presented in Sept 2023 with chest discomfort  Found to be in afib  Trop neg   Signed out AMA  Got dizzy, SOB  Came back   This time  trop 1100   EKG with inferior ST elevation LHC showed severe mid LAD stenosis, occluded RPL V   Pt undewent uncessefull intervention and went on to have  CABG x 3 (LIMA to LAD; SVT to PDA sequential to PLAT; RV pulmonary vein isolation and MAZE ablation; MV repair with 30 mm Carpentier Adams ring annuloplasty; closure of LA appendage) The pt developed afib post op   Rx with amiodarone  Converted to SR on 11/01/22  He was last seen in clinic by Inda Coke in Feb 2024   Since seen, the pt  denies CP No SOB   No palpitations   No dizziness     Diet: Breakfast;   Fiber one and banana and coffee(creamer) Lunch  Varies  Sandwich or leftovers    Water  Dinner carrots, cucumbers, onions, celery, meat.  Water    Current Meds  Medication Sig   acetaminophen (TYLENOL) 500 MG tablet Take 500-1,000 mg by mouth daily as needed for headache or mild pain.   aspirin EC 81 MG tablet Take 1 tablet (81 mg total) by mouth daily. Swallow whole.   atorvastatin (LIPITOR) 80 MG tablet Take 1 tablet (80 mg total) by mouth daily. (Patient taking differently: Take 40 mg by mouth daily.)   Cholecalciferol (VITAMIN D-3 PO) Take 1 capsule by mouth daily.   EPINEPHrine 0.3 mg/0.3 mL IJ SOAJ injection Inject 0.3 mg into the muscle as needed for anaphylaxis.   Multiple Vitamins-Minerals (MULTIVITAMIN ADULTS 50+ PO) Take 1 tablet by mouth daily.   nateglinide (STARLIX) 60 MG tablet Take 1 tablet (60 mg total) by mouth 3 (three) times daily with meals.    omeprazole (PRILOSEC) 40 MG capsule Take 1 capsule (40 mg total) by mouth daily.   spironolactone (ALDACTONE) 25 MG tablet Take 1 tablet (25 mg total) by mouth daily. (Patient taking differently: Take 12.5 mg by mouth as needed (elevated bp).)   torsemide (DEMADEX) 10 MG tablet Take 1 tablet (10 mg total) by mouth daily. (Patient taking differently: Take 10 mg by mouth as needed (fluid retention).)   warfarin (COUMADIN) 2.5 MG tablet TAKE 2 TO 3 TABLETS BY MOUTH DAILY AS DIRECTED BY COUMADIN CLINIC     Allergies:   Fenofibrate   Past Medical History:  Diagnosis Date   Acute intractable headache 09/02/2021   Adenomatous colon polyp    Anemia    Atrial fibrillation (HCC)    Cataract    Diabetes mellitus without complication (HCC)    Diverticulosis    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HCC) 2012   Gastroesophageal /radiation Tx and Chemo   GERD (gastroesophageal reflux disease)    Glaucoma    H. pylori infection 2011   Heart murmur    History of radiation therapy 09/20/10 thru 10/29/10   gastroesophageal junction/gastric cardia   Hypertension    Neuromuscular disorder (HCC)  Other and unspecified hyperlipidemia    Sleep apnea    On CPAP   Status post chemotherapy 09/22/10 thru 10/27/10   concurrent w/radiation   Unspecified sleep apnea     Past Surgical History:  Procedure Laterality Date   CATARACT EXTRACTION     bilateral   COLONOSCOPY     CORONARY ARTERY BYPASS GRAFT N/A 10/22/2021   Procedure: CORONARY ARTERY BYPASS GRAFTING (CABG) X THREE, USING LEFT INTERNAL MAMMARY ARTERY AND RIGHT LEG GREATER SAPHENOUS VEIN HARVESTED ENDOSCOPICALLY;  Surgeon: Eugenio Hoes, MD;  Location: [facility] OR;  Service: Open Heart Surgery;  Laterality: N/A;   CORONARY BALLOON ANGIOPLASTY N/A 10/13/2021   Procedure: CORONARY BALLOON ANGIOPLASTY;  Surgeon: Tonny Bollman, MD;  Location: [facility] INVASIVE CV LAB;  Service: Cardiovascular;  Laterality: N/A;   CORONARY BALLOON ANGIOPLASTY N/A 10/15/2021   Procedure:  CORONARY BALLOON ANGIOPLASTY;  Surgeon: Orbie Pyo, MD;  Location: [facility] INVASIVE CV LAB;  Service: Cardiovascular;  Laterality: N/A;   ELBOW BURSA SURGERY     LEFT HEART CATH AND CORONARY ANGIOGRAPHY N/A 10/13/2021   Procedure: LEFT HEART CATH AND CORONARY ANGIOGRAPHY;  Surgeon: Tonny Bollman, MD;  Location: [facility] INVASIVE CV LAB;  Service: Cardiovascular;  Laterality: N/A;   LEFT HEART CATH AND CORONARY ANGIOGRAPHY N/A 10/15/2021   Procedure: LEFT HEART CATH AND CORONARY ANGIOGRAPHY;  Surgeon: Orbie Pyo, MD;  Location: [facility] INVASIVE CV LAB;  Service: Cardiovascular;  Laterality: N/A;   LEG SURGERY  1980   right leg    MAZE N/A 10/22/2021   Procedure: MAZE;  Surgeon: Eugenio Hoes, MD;  Location: [facility] OR;  Service: Open Heart Surgery;  Laterality: N/A;   MITRAL VALVE REPAIR N/A 10/22/2021   Procedure: MITRAL VALVE REPAIR (MVR) USING MCCARTHY-ADAMS RING SIZE ;  Surgeon: Eugenio Hoes, MD;  Location: [facility] OR;  Service: Open Heart Surgery;  Laterality: N/A;   SKIN BIOPSY  08/22/11   shave biopsy =benign   TEE WITHOUT CARDIOVERSION N/A 10/22/2021   Procedure: TRANSESOPHAGEAL ECHOCARDIOGRAM (TEE);  Surgeon: Eugenio Hoes, MD;  Location: [facility] OR;  Service: Open Heart Surgery;  Laterality: N/A;   TONSILLECTOMY       Social History:  The patient  reports that he quit smoking about 17 years ago. His smoking use included cigarettes. He started smoking about 57 years ago. He has a 40 pack-year smoking history. He has never used smokeless tobacco. He reports current alcohol use. He reports that he does not use drugs.   Family History:  The patient's family history includes Arthritis in his mother; Diabetes in his brother and father.    ROS:  Please see the history of present illness. All other systems are reviewed and  Negative to the above problem except as noted.    PHYSICAL EXAM: VS:  BP 115/69   Pulse (!) 51   Ht 5\' 10"  (1.778 m)   Wt 200 lb (90.7 kg)   SpO2 95%   BMI 28.70 kg/m   GEN:  Overweight 77 yo in no acute distress  HEENT: normal  Neck: no JVD, no carotid bruits Cardiac: RRR; no murmurs  No Le  edema  Respiratory:  clear to auscultation bilaterally GI: soft, nontender, No hepatomegaly     EKG:  EKG is not ordered today.  Echo November  2023   1. Left ventricular ejection fraction, by estimation, is 60 to 65%. The  left ventricle has normal function. The left ventricle has no regional  wall motion abnormalities. Left ventricular diastolic parameters are  indeterminate.   2. Right ventricular systolic function is moderately reduced. The right  ventricular size is moderately enlarged. There is normal pulmonary artery  systolic pressure.   3. MV mean gradient 2 mmHg. Normal prosthesis. No evidence of mitral  valve regurgitation. There is a 30 mm MCCARTHY-ADAMS RING present in the  mitral position. Procedure Date: 10/22/2021.   4. Aortic valve regurgitation is not visualized. Aortic valve  sclerosis/calcification is present, without any evidence of aortic  stenosis.   5. The inferior vena cava is dilated in size with >50% respiratory  variability, suggesting right atrial pressure of 8 mmHg.   Comparison(s): No significant change from prior study.    LHC   Sept 15, 2023    Prox LAD to Mid LAD lesion is 90% stenosed.   Mid LAD lesion is 80% stenosed.   Prox RCA lesion is 50% stenosed.   RPAV lesion is 95% stenosed.   RPDA lesion is 60% stenosed.   1.  Heavily calcified and acutely angulated proximal LAD lesion treated with balloon angioplasty only; the lesion could not be crossed for atherectomy (see procedural details).   2.  Recanalized RPLV branch. 3.  LVEDP of 9 mmHg.   Recommendation: After review with team see attending Dr. Clifton James, further attempts at PCI will be deferred and the patient will be referred for consideration for coronary artery bypass grafting and possible aortic valve replacement given how heavily calcified aortic valve on  echocardiogram.  The patient's Brilinta will be discontinued and a heparin drip initiated.    LHC  Sept 13, 2023  1.  Total occlusion of the right posterolateral branch with associated thrombus, unsuccessful PTCA due to inability to establish antegrade flow 2.  Marked calcification, ectasia, and tortuosity of the native RCA with mild to moderate nonobstructive plaquing throughout the vessel 3.  Patent left main 4.  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  Patent left circumflex without significant stenosis 6.  Successful placement of right internal jugular central venous line with CVP 11 mmHg and mixed venous oxygen saturation 76% on norepinephrine 5 mics   Recommendations: Dual antiplatelet therapy with aspirin and ticagrelor at least 12 months without interruption.  Consider staged atherectomy and PCI of the LAD prior to discharge.  Findings and plan discussed with patient's son.   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  The patient does not have concerns regarding  medicines.  Signed, Dietrich Pates, MD  12/27/2022 4:15 PM    [facility] [address] [place], [place], [place]  [address] Phone: [phone]; Fax: [phone]

## 2022-12-27 NOTE — Patient Instructions (Signed)
Medication Instructions:   *If you need a refill on your cardiac medications before your next appointment, please call your pharmacy*   Lab Work: NMR  If you have labs (blood work) drawn today and your tests are completely normal, you will receive your results only by: MyChart Message (if you have MyChart) OR A paper copy in the mail If you have any lab test that is abnormal or we need to change your treatment, we will call you to review the results.   Testing/Procedures:    Follow-Up: At [facility], you and your health needs are our priority.  As part of our continuing mission to provide you with exceptional heart care, we have created designated Provider Care Teams.  These Care Teams include your primary Cardiologist (physician) and Advanced Practice Providers (APPs -  Physician Assistants and Nurse Practitioners) who all work together to provide you with the care you need, when you need it.  We recommend signing up for the patient portal called "MyChart".  Sign up information is provided on this After Visit Summary.  MyChart is used to connect with patients for Virtual Visits (Telemedicine).  Patients are able to view lab/test results, encounter notes, upcoming appointments, etc.  Non-urgent messages can be sent to your provider as well.   To learn more about what you can do with MyChart, go to [email or web address].    Your next appointment:   6 month(s) DR Dietrich Pates If Card or EP not listed click to update   DO NOT delete brackets or number around this link :1}   Other Instructions

## 2022-12-28 LAB — NMR, LIPOPROFILE
Cholesterol, Total: 75 mg/dL — ABNORMAL LOW (ref 100–199)
HDL Particle Number: 27.8 umol/L — ABNORMAL LOW (ref >=30.5)
HDL-C: 37 mg/dL — ABNORMAL LOW (ref >39)
LDL Particle Number: <300 nmol/L (ref <1000)
LDL-C (NIH Calc): 23 mg/dL (ref 0–99)
LP-IR Score: 50 — ABNORMAL HIGH (ref <=45)
Small LDL Particle Number: 142 nmol/L (ref <=527)
Triglycerides: 68 mg/dL (ref 0–149)

## 2023-01-01 DIAGNOSIS — E119 Type 2 diabetes mellitus without complications: Secondary | ICD-10-CM | POA: Diagnosis not present

## 2023-01-19 ENCOUNTER — Ambulatory Visit: Payer: Medicare Other | Attending: Internal Medicine | Admitting: *Deleted

## 2023-01-19 DIAGNOSIS — I480 Paroxysmal atrial fibrillation: Secondary | ICD-10-CM

## 2023-01-19 DIAGNOSIS — I4891 Unspecified atrial fibrillation: Secondary | ICD-10-CM

## 2023-01-19 DIAGNOSIS — Z8679 Personal history of other diseases of the circulatory system: Secondary | ICD-10-CM

## 2023-01-19 DIAGNOSIS — I059 Rheumatic mitral valve disease, unspecified: Secondary | ICD-10-CM

## 2023-01-19 DIAGNOSIS — Z9889 Other specified postprocedural states: Secondary | ICD-10-CM | POA: Diagnosis not present

## 2023-01-19 DIAGNOSIS — Z7901 Long term (current) use of anticoagulants: Secondary | ICD-10-CM

## 2023-01-19 LAB — POCT INR: INR: 2.5 (ref 2.0–3.0)

## 2023-01-19 NOTE — Patient Instructions (Signed)
Description   Continue taking warfarin 3 tablets daily except 2 tablets each Sundays, Tuesdays, and Thursdays. Repeat INR in 6 weeks. [facility]ticoagulation Clinic [phone]

## 2023-02-01 DIAGNOSIS — E119 Type 2 diabetes mellitus without complications: Secondary | ICD-10-CM | POA: Diagnosis not present

## 2023-02-08 ENCOUNTER — Inpatient Hospital Stay: Payer: Medicare Other | Attending: Oncology | Admitting: Oncology

## 2023-02-08 ENCOUNTER — Inpatient Hospital Stay: Payer: Medicare Other

## 2023-02-08 ENCOUNTER — Other Ambulatory Visit (HOSPITAL_BASED_OUTPATIENT_CLINIC_OR_DEPARTMENT_OTHER): Payer: Self-pay

## 2023-02-08 ENCOUNTER — Other Ambulatory Visit: Payer: Medicare Other

## 2023-02-08 VITALS — BP 115/87 | HR 60 | Temp 98.1°F | Resp 18 | Ht 70.0 in | Wt 199.4 lb

## 2023-02-08 DIAGNOSIS — I4891 Unspecified atrial fibrillation: Secondary | ICD-10-CM | POA: Insufficient documentation

## 2023-02-08 DIAGNOSIS — Z9221 Personal history of antineoplastic chemotherapy: Secondary | ICD-10-CM | POA: Diagnosis not present

## 2023-02-08 DIAGNOSIS — R970 Elevated carcinoembryonic antigen [CEA]: Secondary | ICD-10-CM | POA: Insufficient documentation

## 2023-02-08 DIAGNOSIS — C169 Malignant neoplasm of stomach, unspecified: Secondary | ICD-10-CM

## 2023-02-08 DIAGNOSIS — Z923 Personal history of irradiation: Secondary | ICD-10-CM | POA: Insufficient documentation

## 2023-02-08 DIAGNOSIS — E119 Type 2 diabetes mellitus without complications: Secondary | ICD-10-CM | POA: Diagnosis not present

## 2023-02-08 DIAGNOSIS — Z8501 Personal history of malignant neoplasm of esophagus: Secondary | ICD-10-CM | POA: Insufficient documentation

## 2023-02-08 DIAGNOSIS — G4730 Sleep apnea, unspecified: Secondary | ICD-10-CM | POA: Diagnosis not present

## 2023-02-08 DIAGNOSIS — D509 Iron deficiency anemia, unspecified: Secondary | ICD-10-CM | POA: Diagnosis not present

## 2023-02-08 LAB — CEA (ACCESS): CEA (CHCC): 1.66 ng/mL (ref 0.00–5.00)

## 2023-02-08 MED ORDER — COMIRNATY 30 MCG/0.3ML IM SUSY
0.3000 mL | PREFILLED_SYRINGE | Freq: Once | INTRAMUSCULAR | 0 refills | Status: AC
  Filled 2023-02-08: qty 0.3, 1d supply, fill #0

## 2023-02-08 NOTE — Progress Notes (Signed)
 [facility] OFFICE PROGRESS NOTE   Diagnosis: Gastroesophageal cancer  INTERVAL HISTORY:   Jon [name] returns as scheduled.  He feels well.  Good appetite.  No difficulty with swallowing.  He is exercising. He lost weight following coronary artery bypass surgery/mitral valve repair in 2023.  Objective:  Vital signs in last 24 hours:  Blood pressure 115/87, pulse 60, temperature 98.1 F (36.7 C), temperature source Temporal, resp. rate 18, height 5' 10 (1.778 m), weight 199 lb 6.4 oz (90.4 kg), SpO2 98%.     Lymphatics: No cervical, supraclavicular, axillary, or inguinal nodes Resp: End inspiratory rhonchi at the left greater than right posterior base, no respiratory distress Cardio: Regular rate and rhythm GI: No hepatosplenomegaly, no mass, nontender Vascular: No leg edema  Lab Results:  Lab Results  Component Value Date   WBC 7.2 05/10/2022   HGB 11.8 (L) 05/10/2022   HCT 36.1 (L) 05/10/2022   MCV 78.4 05/10/2022   PLT 339.0 05/10/2022   NEUTROABS 4.1 05/10/2022    CMP  Lab Results  Component Value Date   NA 138 12/12/2022   K 5.0 12/12/2022   CL 103 12/12/2022   CO2 29 12/12/2022   GLUCOSE 107 (H) 12/12/2022   BUN 17 12/12/2022   CREATININE 1.16 12/12/2022   CALCIUM  9.5 12/12/2022   PROT 7.1 12/12/2022   ALBUMIN  4.3 12/12/2022   AST 24 12/12/2022   ALT 14 12/12/2022   ALKPHOS 63 12/12/2022   BILITOT 0.8 12/12/2022   GFRNONAA 58 (L) 11/02/2021   GFRAA 52 (L) 11/23/2015    Lab Results  Component Value Date   CEA1 1.02 02/07/2020   CEA 1.66 02/08/2023    Medications: I have reviewed the patient's current medications.   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 Staging CT scan 08/27/2010 revealed small gastrohepatic ligament, periportal and retrocrural lymph nodes without other evidence of metastatic disease. Staging PET  scan on 09/21/2010 showed abnormal malignant range FDG uptake at the site of the biopsy-proven gastric cancer. Small perigastric lymph nodes were not FDG avid above the background level. The radiologist commented that this may reflect a benign etiology or a false-negative appearance due to the small lymph node size. There was no evidence for distant metastatic disease. The CEA was markedly elevated at 482 on 09/14/2010. He completed radiation 09/20/2010 through 10/29/2010. He received concurrent weekly Taxol/carboplatin chemotherapy 09/22/2010 through 10/27/2010. Restaging PET scan 11/22/2010 showed interval resolution of previously demonstrated hypermetabolic activity within the proximal stomach. There was no evidence of metastatic disease. Restaging PET scan 05/10/2011 showed no evidence of gastroesophageal carcinoma recurrence or metastasis. Status post upper endoscopy 12/17/2010-the very distal portion of the esophagus was ulcerated. The proximal stomach revealed a malignant appearing ulcerative mass in the region of the cardia/gastroesophageal junction. The overall bulk of the lesion was significantly improved but still quite abnormal and large. Multiple biopsies were taken. Pathology was negative for malignancy. Elevated CEA (482) on 09/14/2010.   Dysphagia secondary to the lower esophagus/upper gastric tumor, resolved. Microcytic anemia. The hemoglobin was normal on 03/31/2011. There was persistent red cell microcytosis. Hemoglobin and MCV are normal Sleep apnea. Early diabetes. History of atrial fibrillation. Hyperlipidemia. History of colon polyps. History of Helicobacter pylori infection in 2011. History of neutropenia secondary to chemotherapy, resolved. CT of the chest on 02/15/2011 with left lower lung density/air bronchograms-? Radiation change. Colonoscopy 03/27/2013. Mild melanosis found throughout the entire examined colon. 2 diminutive  polyps found in the descending colon (Tubular  adenoma. No high-grade dysplasia or malignancy noted). Moderate diverticulosis in the left colon. Followup colonoscopy 06/20/2018- polyps removed from the descending and transverse colon, tubular adenomas Nodular hyperpigmented mole at the right upper back .   Referred to dermatology 02/07/2020 CAD-status post coronary artery bypass surgery September 2023 Mitral valve repair September 2023      Disposition: Jon [name] is in clinical remission from gastroesophageal cancer.  He is now greater than 12 years out from diagnosis.  He would like to continue follow-up at the cancer center.  He will return for an office visit and CEA in 1 year.  Arley Hof, MD  02/08/2023  10:08 AM

## 2023-02-17 ENCOUNTER — Other Ambulatory Visit: Payer: Self-pay | Admitting: Cardiovascular Disease

## 2023-02-17 DIAGNOSIS — I480 Paroxysmal atrial fibrillation: Secondary | ICD-10-CM

## 2023-03-02 ENCOUNTER — Ambulatory Visit: Payer: Medicare Other | Attending: Thoracic Surgery (Cardiothoracic Vascular Surgery) | Admitting: *Deleted

## 2023-03-02 DIAGNOSIS — Z8679 Personal history of other diseases of the circulatory system: Secondary | ICD-10-CM | POA: Diagnosis not present

## 2023-03-02 DIAGNOSIS — Z7901 Long term (current) use of anticoagulants: Secondary | ICD-10-CM | POA: Diagnosis not present

## 2023-03-02 DIAGNOSIS — I059 Rheumatic mitral valve disease, unspecified: Secondary | ICD-10-CM

## 2023-03-02 DIAGNOSIS — Z9889 Other specified postprocedural states: Secondary | ICD-10-CM

## 2023-03-02 DIAGNOSIS — I480 Paroxysmal atrial fibrillation: Secondary | ICD-10-CM | POA: Diagnosis not present

## 2023-03-02 DIAGNOSIS — I4891 Unspecified atrial fibrillation: Secondary | ICD-10-CM | POA: Diagnosis not present

## 2023-03-02 LAB — POCT INR: INR: 2.1 (ref 2.0–3.0)

## 2023-03-02 NOTE — Patient Instructions (Signed)
Description   Continue taking warfarin 3 tablets daily except 2 tablets each Sundays, Tuesdays, and Thursdays. Repeat INR in 6 weeks. Anticoagulation Clinic [phone]

## 2023-03-04 DIAGNOSIS — E119 Type 2 diabetes mellitus without complications: Secondary | ICD-10-CM | POA: Diagnosis not present

## 2023-03-14 ENCOUNTER — Encounter: Payer: Self-pay | Admitting: Internal Medicine

## 2023-03-14 ENCOUNTER — Ambulatory Visit: Payer: Medicare Other | Admitting: Internal Medicine

## 2023-03-14 VITALS — BP 110/70 | HR 52 | Temp 98.6°F | Ht 70.0 in | Wt 198.0 lb

## 2023-03-14 DIAGNOSIS — E1159 Type 2 diabetes mellitus with other circulatory complications: Secondary | ICD-10-CM | POA: Diagnosis not present

## 2023-03-14 DIAGNOSIS — I480 Paroxysmal atrial fibrillation: Secondary | ICD-10-CM | POA: Diagnosis not present

## 2023-03-14 DIAGNOSIS — K2100 Gastro-esophageal reflux disease with esophagitis, without bleeding: Secondary | ICD-10-CM

## 2023-03-14 DIAGNOSIS — C169 Malignant neoplasm of stomach, unspecified: Secondary | ICD-10-CM | POA: Diagnosis not present

## 2023-03-14 LAB — COMPREHENSIVE METABOLIC PANEL
ALT: 18 U/L (ref 0–53)
AST: 30 U/L (ref 0–37)
Albumin: 4.6 g/dL (ref 3.5–5.2)
Alkaline Phosphatase: 72 U/L (ref 39–117)
BUN: 15 mg/dL (ref 6–23)
CO2: 31 meq/L (ref 19–32)
Calcium: 9.6 mg/dL (ref 8.4–10.5)
Chloride: 102 meq/L (ref 96–112)
Creatinine, Ser: 1.11 mg/dL (ref 0.40–1.50)
GFR: 63.94 mL/min (ref >60.00)
Glucose, Bld: 127 mg/dL — ABNORMAL HIGH (ref 70–99)
Potassium: 4.6 meq/L (ref 3.5–5.1)
Sodium: 138 meq/L (ref 135–145)
Total Bilirubin: 0.7 mg/dL (ref 0.2–1.2)
Total Protein: 7.6 g/dL (ref 6.0–8.3)

## 2023-03-14 LAB — CBC WITH DIFFERENTIAL/PLATELET
Basophils Absolute: 0.1 10*3/uL (ref 0.0–0.1)
Basophils Relative: 0.9 % (ref 0.0–3.0)
Eosinophils Absolute: 0.4 10*3/uL (ref 0.0–0.7)
Eosinophils Relative: 5.4 % — ABNORMAL HIGH (ref 0.0–5.0)
HCT: 42.8 % (ref 39.0–52.0)
Hemoglobin: 14 g/dL (ref 13.0–17.0)
Lymphocytes Relative: 24.6 % (ref 12.0–46.0)
Lymphs Abs: 1.8 10*3/uL (ref 0.7–4.0)
MCHC: 32.8 g/dL (ref 30.0–36.0)
MCV: 81 fL (ref 78.0–100.0)
Monocytes Absolute: 0.8 10*3/uL (ref 0.1–1.0)
Monocytes Relative: 10.5 % (ref 3.0–12.0)
Neutro Abs: 4.4 10*3/uL (ref 1.4–7.7)
Neutrophils Relative %: 58.6 % (ref 43.0–77.0)
Platelets: 318 10*3/uL (ref 150.0–400.0)
RBC: 5.29 Mil/uL (ref 4.22–5.81)
RDW: 19.2 % — ABNORMAL HIGH (ref 11.5–15.5)
WBC: 7.4 10*3/uL (ref 4.0–10.5)

## 2023-03-14 NOTE — Assessment & Plan Note (Signed)
Cont on Prilosec ?

## 2023-03-14 NOTE — Progress Notes (Signed)
Subjective:  Patient ID: Jon [name], male    DOB: [birth year]  Age: 78 y.o. MRN: [id number]  CC: No chief complaint on file.   HPI Jon [name] presents for DM, dyslipidemia, GERD f/u  Outpatient Medications Prior to Visit  Medication Sig Dispense Refill   acetaminophen (TYLENOL) 500 MG tablet Take 500-1,000 mg by mouth daily as needed for headache or mild pain. (Patient not taking: Reported on 02/08/2023)     aspirin EC 81 MG tablet Take 1 tablet (81 mg total) by mouth daily. Swallow whole. 30 tablet 12   atorvastatin (LIPITOR) 80 MG tablet Take 1 tablet (80 mg total) by mouth daily. (Patient taking differently: Take 40 mg by mouth daily.) 90 tablet 3   Cholecalciferol (VITAMIN D-3 PO) Take 1 capsule by mouth daily.     EPINEPHrine 0.3 mg/0.3 mL IJ SOAJ injection Inject 0.3 mg into the muscle as needed for anaphylaxis. (Patient not taking: Reported on 02/08/2023) 1 each 0   Multiple Vitamins-Minerals (MULTIVITAMIN ADULTS 50+ PO) Take 1 tablet by mouth daily.     nateglinide (STARLIX) 60 MG tablet Take 1 tablet (60 mg total) by mouth 3 (three) times daily with meals. 270 tablet 3   omeprazole (PRILOSEC) 40 MG capsule Take 1 capsule (40 mg total) by mouth daily. 90 capsule 3   spironolactone (ALDACTONE) 25 MG tablet Take 1 tablet (25 mg total) by mouth daily. (Patient not taking: Reported on 02/08/2023) 30 tablet 3   torsemide (DEMADEX) 10 MG tablet Take 1 tablet (10 mg total) by mouth daily. (Patient not taking: Reported on 02/08/2023) 30 tablet 3   warfarin (COUMADIN) 2.5 MG tablet TAKE 2 TO 3 TABLETS BY MOUTH DAILY AS DIRECTED BY COUMADIN CLINIC 100 tablet 1   No facility-administered medications prior to visit.    ROS: Review of Systems  Constitutional:  Negative for appetite change, fatigue and unexpected weight change.  HENT:  Negative for congestion, nosebleeds, sneezing, sore throat and trouble swallowing.   Eyes:  Negative for itching and visual disturbance.  Respiratory:   Negative for cough.   Cardiovascular:  Negative for chest pain, palpitations and leg swelling.  Gastrointestinal:  Negative for abdominal distention, blood in stool, diarrhea and nausea.  Genitourinary:  Negative for frequency and hematuria.  Musculoskeletal:  Positive for arthralgias. Negative for back pain, gait problem, joint swelling and neck pain.  Skin:  Negative for rash.  Neurological:  Negative for dizziness, tremors, speech difficulty and weakness.  Psychiatric/Behavioral:  Negative for agitation, dysphoric mood, sleep disturbance and suicidal ideas. The patient is not nervous/anxious.     Objective:  BP 110/70 (BP Location: Left Arm, Patient Position: Sitting, Cuff Size: Normal)   Pulse (!) 52   Temp 98.6 F (37 C) (Oral)   Ht 5\' 10"  (1.778 m)   Wt 198 lb (89.8 kg)   SpO2 99%   BMI 28.41 kg/m   BP Readings from Last 3 Encounters:  03/14/23 110/70  02/08/23 115/87  12/27/22 115/69    Wt Readings from Last 3 Encounters:  03/14/23 198 lb (89.8 kg)  02/08/23 199 lb 6.4 oz (90.4 kg)  12/27/22 200 lb (90.7 kg)    Physical Exam Constitutional:      General: He is not in acute distress.    Appearance: He is well-developed. He is obese.     Comments: NAD  Eyes:     Conjunctiva/sclera: Conjunctivae normal.     Pupils: Pupils are equal, round, and reactive to light.  Neck:     Thyroid: No thyromegaly.     Vascular: No JVD.  Cardiovascular:     Rate and Rhythm: Normal rate and regular rhythm.     Heart sounds: Normal heart sounds. No murmur heard.    No friction rub. No gallop.  Pulmonary:     Effort: Pulmonary effort is normal. No respiratory distress.     Breath sounds: Normal breath sounds. No wheezing or rales.  Chest:     Chest wall: No tenderness.  Abdominal:     General: Bowel sounds are normal. There is no distension.     Palpations: Abdomen is soft. There is no mass.     Tenderness: There is no abdominal tenderness. There is no guarding or rebound.   Musculoskeletal:        General: Deformity present. No tenderness. Normal range of motion.     Cervical back: Normal range of motion.     Right lower leg: No edema.     Left lower leg: No edema.  Lymphadenopathy:     Cervical: No cervical adenopathy.  Skin:    General: Skin is warm and dry.     Findings: No rash.  Neurological:     Mental Status: He is alert and oriented to person, place, and time.     Cranial Nerves: No cranial nerve deficit.     Motor: No abnormal muscle tone.     Coordination: Coordination normal.     Gait: Gait normal.     Deep Tendon Reflexes: Reflexes are normal and symmetric.  Psychiatric:        Behavior: Behavior normal.        Thought Content: Thought content normal.        Judgment: Judgment normal.   L elbow bursa w/swelling, NT  Lab Results  Component Value Date   WBC 7.2 05/10/2022   HGB 11.8 (L) 05/10/2022   HCT 36.1 (L) 05/10/2022   PLT 339.0 05/10/2022   GLUCOSE 107 (H) 12/12/2022   CHOL 135 10/14/2021   TRIG 164 (H) 10/14/2021   HDL 42 10/14/2021   LDLDIRECT 30.0 11/10/2020   LDLCALC 60 10/14/2021   ALT 14 12/12/2022   AST 24 12/12/2022   NA 138 12/12/2022   K 5.0 12/12/2022   CL 103 12/12/2022   CREATININE 1.16 12/12/2022   BUN 17 12/12/2022   CO2 29 12/12/2022   TSH 0.52 12/12/2022   PSA 1.38 06/10/2019   INR 2.1 03/02/2023   HGBA1C 7.0 (H) 12/12/2022    No results found.  Assessment & Plan:   Problem List Items Addressed This Visit     GERD   Cont on Prilosec      PAF (paroxysmal atrial fibrillation) (HCC) - Primary   No relapse On ASA, Coumadin      Relevant Orders   CBC with Differential/Platelet   Comprehensive metabolic panel   Hemoglobin A1c   Primary adenocarcinoma of GE junction   F/u w/Dr Marina Goodell, Dr Truett Perna EGD is due this year      Diabetes mellitus, type 2 (HCC)   C/o occ low CBG - 80s in am, rare Reduce Starlix to 60 mg tid prn Check A1c      Relevant Orders   CBC with  Differential/Platelet   Comprehensive metabolic panel   Hemoglobin A1c      No orders of the defined types were placed in this encounter.     Follow-up: Return in about 6 months (around 09/11/2023) for Wellness Exam.  Sonda Primes, MD

## 2023-03-14 NOTE — Assessment & Plan Note (Signed)
C/o occ low CBG - 80s in am, rare Reduce Starlix to 60 mg tid prn Check A1c

## 2023-03-14 NOTE — Assessment & Plan Note (Addendum)
F/u w/Dr Marina Goodell, Dr Truett Perna EGD is due this year

## 2023-03-14 NOTE — Assessment & Plan Note (Signed)
No relapse On ASA, Coumadin

## 2023-03-15 DIAGNOSIS — G4733 Obstructive sleep apnea (adult) (pediatric): Secondary | ICD-10-CM | POA: Diagnosis not present

## 2023-03-15 LAB — HEMOGLOBIN A1C: Hgb A1c MFr Bld: 7.1 % — ABNORMAL HIGH (ref 4.6–6.5)

## 2023-03-17 ENCOUNTER — Ambulatory Visit (HOSPITAL_BASED_OUTPATIENT_CLINIC_OR_DEPARTMENT_OTHER): Payer: Medicare Other | Admitting: Pulmonary Disease

## 2023-03-17 ENCOUNTER — Encounter (HOSPITAL_BASED_OUTPATIENT_CLINIC_OR_DEPARTMENT_OTHER): Payer: Self-pay | Admitting: Pulmonary Disease

## 2023-03-17 VITALS — BP 118/76 | HR 74 | Ht 70.0 in | Wt 197.8 lb

## 2023-03-17 DIAGNOSIS — G4733 Obstructive sleep apnea (adult) (pediatric): Secondary | ICD-10-CM

## 2023-03-17 NOTE — Patient Instructions (Signed)
CPAP supplies will be renewed for a year. CPAP is working well

## 2023-03-17 NOTE — Progress Notes (Signed)
   Subjective:    Patient ID: Jon [name], male    DOB: March 12, 1945, 78 y.o.   MRN: [id number]  HPI   78 yo for follow-up of OSA New CPAP 2022   PMH : - chemo-RT for gastroesophageal adenoCA in 2012 ([facility] & [facility] Goodell) complicated by LLL radiation pneumonitis -PAF  - CABG x 3, with mitral valve repair and maze procedure 10/2021   Annual follow-up.  He is recovering well from CABG last year.  He has resumed normal activity.  He denies any daytime somnolence or fatigue.  He is very compliant with his CPAP machine, denies any problems with mask or pressure  CPAP download was reviewed which shows excellent control of events on auto settings 8 to 15 cm with average pressure of 13 cm.  He has a large leak.  He is very compliant close to 7 hours per night.  CPAP is only helped improve his daytime somnolence and fatigue   Significant tests/ events reviewed NPSG 2007:  AHI 74/hr    Review of Systems neg for any significant sore throat, dysphagia, itching, sneezing, nasal congestion or excess/ purulent secretions, fever, chills, sweats, unintended wt loss, pleuritic or exertional cp, hempoptysis, orthopnea pnd or change in chronic leg swelling. Also denies presyncope, palpitations, heartburn, abdominal pain, nausea, vomiting, diarrhea or change in bowel or urinary habits, dysuria,hematuria, rash, arthralgias, visual complaints, headache, numbness weakness or ataxia.     Objective:   Physical Exam  Gen. Pleasant, in no distress ENT - no lesions, no post nasal drip Neck: No JVD, no thyromegaly, no carotid bruits Lungs: no use of accessory muscles, no dullness to percussion, decreased without rales or rhonchi  Cardiovascular: Rhythm regular, heart sounds  normal, no murmurs or gallops, no peripheral edema Musculoskeletal: No deformities, no cyanosis or clubbing , no tremors       Assessment & Plan:    OSA on CPAP -continue current auto settings 8 to 15 cm CPAP supplies will be  renewed for a year compliance with goal of at least 6 hrs every night is the expectation and he is doing very well. Advised against medications with sedative side effects Cautioned against driving when sleepy - understanding that sleepiness will vary on a day to day basis

## 2023-04-01 DIAGNOSIS — E119 Type 2 diabetes mellitus without complications: Secondary | ICD-10-CM | POA: Diagnosis not present

## 2023-04-13 ENCOUNTER — Ambulatory Visit: Payer: Medicare Other | Attending: Cardiology

## 2023-04-13 DIAGNOSIS — Z9889 Other specified postprocedural states: Secondary | ICD-10-CM | POA: Diagnosis not present

## 2023-04-13 DIAGNOSIS — I480 Paroxysmal atrial fibrillation: Secondary | ICD-10-CM | POA: Diagnosis not present

## 2023-04-13 DIAGNOSIS — Z8679 Personal history of other diseases of the circulatory system: Secondary | ICD-10-CM | POA: Diagnosis not present

## 2023-04-13 LAB — POCT INR: INR: 2.4 (ref 2.0–3.0)

## 2023-04-13 NOTE — Patient Instructions (Signed)
 Description   Continue taking warfarin 3 tablets daily except 2 tablets each Sundays, Tuesdays, and Thursdays. Repeat INR in 6 weeks. Anticoagulation Clinic [phone]

## 2023-04-18 ENCOUNTER — Other Ambulatory Visit: Payer: Self-pay | Admitting: Internal Medicine

## 2023-04-24 DIAGNOSIS — H524 Presbyopia: Secondary | ICD-10-CM | POA: Diagnosis not present

## 2023-04-24 DIAGNOSIS — H01024 Squamous blepharitis left upper eyelid: Secondary | ICD-10-CM | POA: Diagnosis not present

## 2023-04-24 DIAGNOSIS — H01021 Squamous blepharitis right upper eyelid: Secondary | ICD-10-CM | POA: Diagnosis not present

## 2023-04-24 DIAGNOSIS — Q141 Congenital malformation of retina: Secondary | ICD-10-CM | POA: Diagnosis not present

## 2023-04-24 DIAGNOSIS — H40023 Open angle with borderline findings, high risk, bilateral: Secondary | ICD-10-CM | POA: Diagnosis not present

## 2023-04-24 DIAGNOSIS — E119 Type 2 diabetes mellitus without complications: Secondary | ICD-10-CM | POA: Diagnosis not present

## 2023-04-24 LAB — HM DIABETES EYE EXAM

## 2023-05-02 DIAGNOSIS — E119 Type 2 diabetes mellitus without complications: Secondary | ICD-10-CM | POA: Diagnosis not present

## 2023-05-08 DIAGNOSIS — K080 Exfoliation of teeth due to systemic causes: Secondary | ICD-10-CM | POA: Diagnosis not present

## 2023-05-16 ENCOUNTER — Other Ambulatory Visit: Payer: Self-pay

## 2023-05-16 DIAGNOSIS — I480 Paroxysmal atrial fibrillation: Secondary | ICD-10-CM

## 2023-05-16 MED ORDER — WARFARIN SODIUM 2.5 MG PO TABS: ORAL_TABLET | ORAL | 1 refills | Status: DC

## 2023-05-25 ENCOUNTER — Ambulatory Visit: Attending: Cardiology | Admitting: *Deleted

## 2023-05-25 DIAGNOSIS — Z9889 Other specified postprocedural states: Secondary | ICD-10-CM | POA: Diagnosis not present

## 2023-05-25 DIAGNOSIS — Z7901 Long term (current) use of anticoagulants: Secondary | ICD-10-CM

## 2023-05-25 DIAGNOSIS — I480 Paroxysmal atrial fibrillation: Secondary | ICD-10-CM

## 2023-05-25 DIAGNOSIS — Z8679 Personal history of other diseases of the circulatory system: Secondary | ICD-10-CM

## 2023-05-25 DIAGNOSIS — I4891 Unspecified atrial fibrillation: Secondary | ICD-10-CM | POA: Diagnosis not present

## 2023-05-25 DIAGNOSIS — I059 Rheumatic mitral valve disease, unspecified: Secondary | ICD-10-CM

## 2023-05-25 LAB — POCT INR: INR: 2.4 (ref 2.0–3.0)

## 2023-05-25 NOTE — Patient Instructions (Signed)
 Description   Continue taking warfarin 3 tablets daily except 2 tablets each Sundays, Tuesdays, and Thursdays. Repeat INR in 6 weeks. Anticoagulation Clinic [phone]

## 2023-06-01 DIAGNOSIS — E119 Type 2 diabetes mellitus without complications: Secondary | ICD-10-CM | POA: Diagnosis not present

## 2023-06-13 ENCOUNTER — Other Ambulatory Visit: Payer: Self-pay

## 2023-06-13 ENCOUNTER — Encounter (HOSPITAL_BASED_OUTPATIENT_CLINIC_OR_DEPARTMENT_OTHER): Payer: Self-pay | Admitting: Emergency Medicine

## 2023-06-13 ENCOUNTER — Emergency Department (HOSPITAL_COMMUNITY)
Admission: EM | Admit: 2023-06-13 | Discharge: 2023-06-13 | Disposition: A | Discharge status: Home / Self Care | Attending: Emergency Medicine | Admitting: Emergency Medicine

## 2023-06-13 ENCOUNTER — Emergency Department (HOSPITAL_BASED_OUTPATIENT_CLINIC_OR_DEPARTMENT_OTHER)
Admission: EM | Admit: 2023-06-13 | Discharge: 2023-06-14 | Disposition: A | Discharge status: Home / Self Care | Attending: Emergency Medicine | Admitting: Emergency Medicine

## 2023-06-13 ENCOUNTER — Encounter (HOSPITAL_COMMUNITY): Payer: Self-pay | Admitting: Pharmacy Technician

## 2023-06-13 DIAGNOSIS — Z5321 Procedure and treatment not carried out due to patient leaving prior to being seen by health care provider: Secondary | ICD-10-CM | POA: Insufficient documentation

## 2023-06-13 DIAGNOSIS — M542 Cervicalgia: Secondary | ICD-10-CM | POA: Diagnosis not present

## 2023-06-13 DIAGNOSIS — Z7982 Long term (current) use of aspirin: Secondary | ICD-10-CM | POA: Insufficient documentation

## 2023-06-13 DIAGNOSIS — M25519 Pain in unspecified shoulder: Secondary | ICD-10-CM | POA: Diagnosis not present

## 2023-06-13 DIAGNOSIS — M436 Torticollis: Secondary | ICD-10-CM | POA: Diagnosis not present

## 2023-06-13 DIAGNOSIS — R519 Headache, unspecified: Secondary | ICD-10-CM | POA: Insufficient documentation

## 2023-06-13 DIAGNOSIS — Z7901 Long term (current) use of anticoagulants: Secondary | ICD-10-CM | POA: Diagnosis not present

## 2023-06-13 NOTE — ED Triage Notes (Addendum)
 Bilateral neck and shoulder pain since Saturday-Had been moving equipment in yard. No radiation- no numbness in hands. Denies falling/injury. Denies CP SOB. Left [facility] ED earlier today due to wait.

## 2023-06-13 NOTE — ED Triage Notes (Signed)
 Pt here with reports of muscles in his shoulders hurting, radiating up to the neck and into the head.

## 2023-06-14 MED ORDER — METHOCARBAMOL 500 MG PO TABS: 500.0000 mg | ORAL_TABLET | Freq: Two times a day (BID) | ORAL | 0 refills | Status: DC

## 2023-06-14 MED ORDER — KETOROLAC TROMETHAMINE 15 MG/ML IJ SOLN
15.0000 mg | Freq: Once | INTRAMUSCULAR | Status: AC
  Administered 2023-06-14: 15 mg via INTRAMUSCULAR
  Filled 2023-06-14: qty 1

## 2023-06-14 MED ORDER — OXYCODONE HCL 5 MG PO TABS
5.0000 mg | ORAL_TABLET | Freq: Once | ORAL | Status: AC
  Administered 2023-06-14: 5 mg via ORAL
  Filled 2023-06-14: qty 1

## 2023-06-14 MED ORDER — DICLOFENAC SODIUM 1 % EX GEL: 4.0000 g | Freq: Four times a day (QID) | CUTANEOUS | 0 refills | Status: DC

## 2023-06-14 MED ORDER — METHYLPREDNISOLONE 4 MG PO TBPK: ORAL_TABLET | ORAL | 0 refills | Status: DC

## 2023-06-14 MED ORDER — ACETAMINOPHEN 500 MG PO TABS
1000.0000 mg | ORAL_TABLET | Freq: Once | ORAL | Status: AC
  Administered 2023-06-14: 1000 mg via ORAL
  Filled 2023-06-14: qty 2

## 2023-06-14 MED ORDER — DOXYCYCLINE HYCLATE 100 MG PO CAPS: 100.0000 mg | ORAL_CAPSULE | Freq: Two times a day (BID) | ORAL | 0 refills | Status: DC

## 2023-06-14 NOTE — ED Provider Notes (Signed)
 [place] EMERGENCY DEPARTMENT AT [facility] Provider Note   CSN: [id number] Arrival date & time: 06/13/23  2254     History  Chief Complaint  Patient presents with   Neck Pain    SOLIMAN PULEIO is a 78 y.o. male.  78 yo M with a chief complaints of neck pain.  This been going on to bilateral sides of his neck.  Patient states that he had been doing some work in the yard and had developed some aches to his shoulders bilaterally which is not abnormal for him but he feels like it has now traveled up into his neck.  Worse with twisting turning.  Feels he gets shooting pain.  Denies numbness or weakness to the arms or legs.  Denies trauma.   Neck Pain      Home Medications Prior to Admission medications   Medication Sig Start Date End Date Taking? Authorizing Provider  diclofenac Sodium (VOLTAREN) 1 % GEL Apply 4 g topically 4 (four) times daily. 06/14/23  Yes Lafawn Lenoir, DO  doxycycline (VIBRAMYCIN) 100 MG capsule Take 1 capsule (100 mg total) by mouth 2 (two) times daily. One po bid x 7 days 06/14/23  Yes Albertus Hughs, DO  methocarbamol (ROBAXIN) 500 MG tablet Take 1 tablet (500 mg total) by mouth 2 (two) times daily. 06/14/23  Yes Albertus Hughs, DO  methylPREDNISolone  (MEDROL  DOSEPAK) 4 MG TBPK tablet Day 1: 8mg  before breakfast, 4 mg after lunch, 4 mg after supper, and 8 mg at bedtime Day 2: 4 mg before breakfast, 4 mg after lunch, 4 mg  after supper, and 8 mg  at bedtime Day 3:  4 mg  before breakfast, 4 mg  after lunch, 4 mg after supper, and 4 mg  at bedtime Day 4: 4 mg  before breakfast, 4 mg  after lunch, and 4 mg at bedtime Day 5: 4 mg  before breakfast and 4 mg at bedtime Day 6: 4 mg  before breakfast 06/14/23  Yes Jahmeer Porche, DO  acetaminophen  (TYLENOL ) 500 MG tablet Take 500-1,000 mg by mouth daily as needed for headache or mild pain (pain score 1-3).    [provider]  aspirin  EC 81 MG tablet Take 1 tablet (81 mg total) by mouth daily. Swallow whole. 11/02/21    Stehler, Barba Bonnet, PA-C  atorvastatin  (LIPITOR ) 80 MG tablet Take 1 tablet (80 mg total) by mouth daily. Patient taking differently: Take 40 mg by mouth daily. 03/22/22   Plotnikov, Aleksei V, MD  Cholecalciferol (VITAMIN D-3 PO) Take 1 capsule by mouth daily.    [provider]  EPINEPHrine  0.3 mg/0.3 mL IJ SOAJ injection Inject 0.3 mg into the muscle as needed for anaphylaxis. 08/22/21   Reena Canning, NP  Multiple Vitamins-Minerals (MULTIVITAMIN ADULTS 50+ PO) Take 1 tablet by mouth daily.    [provider]  nateglinide  (STARLIX ) 60 MG tablet Take 1 tablet (60 mg total) by mouth 3 (three) times daily with meals. 12/12/22   Plotnikov, Oakley Bellman, MD  omeprazole  (PRILOSEC) 40 MG capsule Take 1 capsule (40 mg total) by mouth daily. 08/09/22   Plotnikov, Aleksei V, MD  spironolactone  (ALDACTONE ) 25 MG tablet Take 1 tablet (25 mg total) by mouth daily. 01/12/22   Plotnikov, Aleksei V, MD  torsemide  (DEMADEX ) 10 MG tablet Take 1 tablet (10 mg total) by mouth daily. 01/12/22   Plotnikov, Aleksei V, MD  warfarin (COUMADIN ) 2.5 MG tablet TAKE 2 TO 3 TABLETS BY MOUTH DAILY AS DIRECTED  BY COUMADIN  CLINIC 05/16/23   Odie Benne, MD      Allergies    Fenofibrate     Review of Systems   Review of Systems  Musculoskeletal:  Positive for neck pain.    Physical Exam Updated Vital Signs BP 100/82   Pulse 63   Temp 98.4 F (36.9 C)   Resp 12   SpO2 96%  Physical Exam Vitals and nursing note reviewed.  Constitutional:      Appearance: He is well-developed.  HENT:     Head: Normocephalic and atraumatic.  Eyes:     Pupils: Pupils are equal, round, and reactive to light.  Neck:     Vascular: No JVD.  Cardiovascular:     Rate and Rhythm: Normal rate and regular rhythm.     Heart sounds: No murmur heard.    No friction rub. No gallop.  Pulmonary:     Effort: No respiratory distress.     Breath sounds: No wheezing.  Abdominal:     General: There is no distension.      Tenderness: There is no abdominal tenderness. There is no guarding or rebound.  Musculoskeletal:        General: Normal range of motion.     Cervical back: Normal range of motion and neck supple.     Comments: Pulse motor and sensation intact in bilateral upper extremities.  Patient has some pain along the trapezius bilaterally about the cervical area and the attachment to the occiput  Skin:    Coloration: Skin is not pale.     Findings: No rash.  Neurological:     Mental Status: He is alert and oriented to person, place, and time.  Psychiatric:        Behavior: Behavior normal.     ED Results / Procedures / Treatments   Labs (all labs ordered are listed, but only abnormal results are displayed) Labs Reviewed - No data to display  EKG None  Radiology No results found.  Procedures Procedures    Medications Ordered in ED Medications  acetaminophen  (TYLENOL ) tablet 1,000 mg (1,000 mg Oral Given 06/14/23 0035)  ketorolac  (TORADOL ) 15 MG/ML injection 15 mg (15 mg Intramuscular Given 06/14/23 0037)  oxyCODONE  (Oxy IR/ROXICODONE ) immediate release tablet 5 mg (5 mg Oral Given 06/14/23 0035)    ED Course/ Medical Decision Making/ A&P                                 Medical Decision Making Risk OTC drugs. Prescription drug management.   78 yo M with a chief complaint of neck pain.  By history it sounds musculoskeletal.  Atraumatic.  Will treat supportively.  PCP follow-up.  12:53 AM:  I have discussed the diagnosis/risks/treatment options with the patient and family.  Evaluation and diagnostic testing in the emergency department does not suggest an emergent condition requiring admission or immediate intervention beyond what has been performed at this time.  They will follow up with PCP. We also discussed returning to the ED immediately if new or worsening sx occur. We discussed the sx which are most concerning (e.g., sudden worsening pain, fever, inability to tolerate by  mouth) that necessitate immediate return. Medications administered to the patient during their visit and any new prescriptions provided to the patient are listed below.  Medications given during this visit Medications  acetaminophen  (TYLENOL ) tablet 1,000 mg (1,000 mg Oral Given 06/14/23 0035)  ketorolac  (TORADOL )  15 MG/ML injection 15 mg (15 mg Intramuscular Given 06/14/23 0037)  oxyCODONE  (Oxy IR/ROXICODONE ) immediate release tablet 5 mg (5 mg Oral Given 06/14/23 0035)     The patient appears reasonably screen and/or stabilized for discharge and I doubt any other medical condition or other [facility] requiring further screening, evaluation, or treatment in the ED at this time prior to discharge.          Final Clinical Impression(s) / ED Diagnoses Final diagnoses:  Torticollis    Rx / DC Orders ED Discharge Orders          Ordered    methylPREDNISolone  (MEDROL  DOSEPAK) 4 MG TBPK tablet        06/14/23 0023    diclofenac Sodium (VOLTAREN) 1 % GEL  4 times daily        06/14/23 0023    methocarbamol (ROBAXIN) 500 MG tablet  2 times daily        06/14/23 0023    doxycycline (VIBRAMYCIN) 100 MG capsule  2 times daily        06/14/23 0024              Karsten Vaughn, DO 06/14/23 [phone]

## 2023-06-14 NOTE — Discharge Instructions (Addendum)
 Follow-up with your family doctor in the office.  Please return for numbness or weakness to your arms or legs.  I prescribed you a steroid to try and help with your neck pain as well as a gel that you can rub on the area that hurts you.  You can also take Tylenol , max dosing would be two Extra strength tablets 4 times a day.  I did also prescribe you antibiotics in case this is due to a tickborne illness.

## 2023-06-15 ENCOUNTER — Encounter: Payer: Self-pay | Admitting: Internal Medicine

## 2023-06-15 ENCOUNTER — Other Ambulatory Visit: Payer: Self-pay | Admitting: Internal Medicine

## 2023-06-15 ENCOUNTER — Ambulatory Visit: Admitting: Internal Medicine

## 2023-06-15 ENCOUNTER — Ambulatory Visit (INDEPENDENT_AMBULATORY_CARE_PROVIDER_SITE_OTHER)

## 2023-06-15 VITALS — BP 118/70 | HR 83 | Temp 98.6°F | Ht 70.0 in | Wt 197.0 lb

## 2023-06-15 DIAGNOSIS — I10 Essential (primary) hypertension: Secondary | ICD-10-CM

## 2023-06-15 DIAGNOSIS — E119 Type 2 diabetes mellitus without complications: Secondary | ICD-10-CM

## 2023-06-15 DIAGNOSIS — M47812 Spondylosis without myelopathy or radiculopathy, cervical region: Secondary | ICD-10-CM | POA: Diagnosis not present

## 2023-06-15 DIAGNOSIS — I480 Paroxysmal atrial fibrillation: Secondary | ICD-10-CM | POA: Diagnosis not present

## 2023-06-15 DIAGNOSIS — E1159 Type 2 diabetes mellitus with other circulatory complications: Secondary | ICD-10-CM | POA: Diagnosis not present

## 2023-06-15 DIAGNOSIS — M40202 Unspecified kyphosis, cervical region: Secondary | ICD-10-CM | POA: Diagnosis not present

## 2023-06-15 DIAGNOSIS — M4802 Spinal stenosis, cervical region: Secondary | ICD-10-CM | POA: Diagnosis not present

## 2023-06-15 DIAGNOSIS — M542 Cervicalgia: Secondary | ICD-10-CM

## 2023-06-15 LAB — BASIC METABOLIC PANEL WITH GFR
BUN: 18 mg/dL (ref 6–23)
CO2: 28 meq/L (ref 19–32)
Calcium: 10 mg/dL (ref 8.4–10.5)
Chloride: 97 meq/L (ref 96–112)
Creatinine, Ser: 1.05 mg/dL (ref 0.40–1.50)
GFR: 68.22 mL/min (ref >60.00)
Glucose, Bld: 138 mg/dL — ABNORMAL HIGH (ref 70–99)
Potassium: 4.8 meq/L (ref 3.5–5.1)
Sodium: 132 meq/L — ABNORMAL LOW (ref 135–145)

## 2023-06-15 LAB — HEMOGLOBIN A1C: Hgb A1c MFr Bld: 6.9 % — ABNORMAL HIGH (ref 4.6–6.5)

## 2023-06-15 MED ORDER — HYDROCODONE-ACETAMINOPHEN 7.5-325 MG PO TABS: 1.0000 | ORAL_TABLET | ORAL | 0 refills | Status: DC | PRN

## 2023-06-15 MED ORDER — DIAZEPAM 5 MG PO TABS: 5.0000 mg | ORAL_TABLET | Freq: Four times a day (QID) | ORAL | 1 refills | Status: DC | PRN

## 2023-06-15 NOTE — Telephone Encounter (Signed)
 Copied from CRM [id number]. Topic: Clinical - Prescription Issue >> Jun 15, 2023  3:35 PM Freya Jesus wrote: Reason for CRM: Patient stated that he's at the pharmacy to pick up his prescription: HYDROcodone -acetaminophen  (NORCO) 7.5-325 MG tablet [[id number]] but the insurance company said they will only cover a quantity of 6. So he is requesting we resend prescription for a quantity of 6 please. - requesting a follow up.

## 2023-06-15 NOTE — Patient Instructions (Signed)
 Hold Atorvastatin  Foam neck brace

## 2023-06-15 NOTE — Assessment & Plan Note (Signed)
 On steroids - started this am

## 2023-06-15 NOTE — Progress Notes (Signed)
 Subjective:  Patient ID: Jon [name], male    DOB: Dec 01, 1945  Age: 78 y.o. MRN: [id number]  CC: Follow-up (Pt has not slept x2 days due to pain in neck. Pt states he had been bitten by 2 ticks x1 month ago and noticed the sxs started afterwards, which has progressively gotten worse. Pt is unable to move head and neck. Pt states it has gotten hard to swallow as well due to the feeling that his throat is swelling or closing up.//**Pt is wanting lab work a/w imaging if possible to help determine what is the cause of his sxs.)   HPI Jon [name] presents for being sick since Fri last week: B shoulder soreness, neck  Pt has not slept x2 days due to pain in neck. Pt states he had been bitten by 2 ticks x1 month ago and noticed the sxs started afterwards, which has progressively gotten worse. Pt is unable to move head and neck. Pt states it has gotten hard to swallow as well due to the feeling that his throat is swelling or closing up. Pt is wanting lab work a/w imaging if possible to help determine what is the cause of his sxs.  Pt went to ER - got steroids, Doxy   Outpatient Medications Prior to Visit  Medication Sig Dispense Refill   acetaminophen  (TYLENOL ) 500 MG tablet Take 500-1,000 mg by mouth daily as needed for headache or mild pain (pain score 1-3).     aspirin  EC 81 MG tablet Take 1 tablet (81 mg total) by mouth daily. Swallow whole. 30 tablet 12   atorvastatin  (LIPITOR ) 80 MG tablet Take 1 tablet (80 mg total) by mouth daily. (Patient taking differently: Take 40 mg by mouth daily.) 90 tablet 3   Cholecalciferol (VITAMIN D-3 PO) Take 1 capsule by mouth daily.     diclofenac Sodium (VOLTAREN) 1 % GEL Apply 4 g topically 4 (four) times daily. 100 g 0   doxycycline (VIBRAMYCIN) 100 MG capsule Take 1 capsule (100 mg total) by mouth 2 (two) times daily. One po bid x 7 days 14 capsule 0   EPINEPHrine  0.3 mg/0.3 mL IJ SOAJ injection Inject 0.3 mg into the muscle as needed for  anaphylaxis. 1 each 0   methocarbamol (ROBAXIN) 500 MG tablet Take 1 tablet (500 mg total) by mouth 2 (two) times daily. 10 tablet 0   methylPREDNISolone  (MEDROL  DOSEPAK) 4 MG TBPK tablet Day 1: 8mg  before breakfast, 4 mg after lunch, 4 mg after supper, and 8 mg at bedtime Day 2: 4 mg before breakfast, 4 mg after lunch, 4 mg  after supper, and 8 mg  at bedtime Day 3:  4 mg  before breakfast, 4 mg  after lunch, 4 mg after supper, and 4 mg  at bedtime Day 4: 4 mg  before breakfast, 4 mg  after lunch, and 4 mg at bedtime Day 5: 4 mg  before breakfast and 4 mg at bedtime Day 6: 4 mg  before breakfast 1 each 0   Multiple Vitamins-Minerals (MULTIVITAMIN ADULTS 50+ PO) Take 1 tablet by mouth daily.     nateglinide  (STARLIX ) 60 MG tablet Take 1 tablet (60 mg total) by mouth 3 (three) times daily with meals. 270 tablet 3   omeprazole  (PRILOSEC) 40 MG capsule Take 1 capsule (40 mg total) by mouth daily. 90 capsule 3   spironolactone  (ALDACTONE ) 25 MG tablet Take 1 tablet (25 mg total) by mouth daily. 30 tablet 3   torsemide  (DEMADEX )  10 MG tablet Take 1 tablet (10 mg total) by mouth daily. 30 tablet 3   warfarin (COUMADIN ) 2.5 MG tablet TAKE 2 TO 3 TABLETS BY MOUTH DAILY AS DIRECTED BY COUMADIN  CLINIC 100 tablet 1   No facility-administered medications prior to visit.    ROS: Review of Systems  Constitutional:  Positive for fatigue. Negative for appetite change and unexpected weight change.  HENT:  Negative for congestion, nosebleeds, sneezing, sore throat and trouble swallowing.   Eyes:  Negative for itching and visual disturbance.  Respiratory:  Negative for cough.   Cardiovascular:  Negative for chest pain, palpitations and leg swelling.  Gastrointestinal:  Negative for abdominal distention, blood in stool, diarrhea and nausea.  Genitourinary:  Negative for frequency and hematuria.  Musculoskeletal:  Positive for neck pain and neck stiffness. Negative for back pain, gait problem and joint swelling.   Skin:  Negative for rash.  Neurological:  Negative for dizziness, tremors, speech difficulty and weakness.  Psychiatric/Behavioral:  Positive for sleep disturbance. Negative for agitation, dysphoric mood and suicidal ideas. The patient is not nervous/anxious.     Objective:  BP 118/70   Pulse 83   Temp 98.6 F (37 C) (Oral)   Ht 5\' 10"  (1.778 m)   Wt 197 lb (89.4 kg)   SpO2 92%   BMI 28.27 kg/m   BP Readings from Last 3 Encounters:  06/15/23 118/70  06/14/23 100/82  06/13/23 120/89    Wt Readings from Last 3 Encounters:  06/15/23 197 lb (89.4 kg)  03/17/23 197 lb 12.8 oz (89.7 kg)  03/14/23 198 lb (89.8 kg)    Physical Exam Constitutional:      General: He is not in acute distress.    Appearance: He is well-developed.     Comments: NAD  Eyes:     Conjunctiva/sclera: Conjunctivae normal.     Pupils: Pupils are equal, round, and reactive to light.  Neck:     Thyroid : No thyromegaly.     Vascular: No JVD.  Cardiovascular:     Rate and Rhythm: Normal rate and regular rhythm.     Heart sounds: Normal heart sounds. No murmur heard.    No friction rub. No gallop.  Pulmonary:     Effort: Pulmonary effort is normal. No respiratory distress.     Breath sounds: Normal breath sounds. No wheezing or rales.  Chest:     Chest wall: No tenderness.  Abdominal:     General: Bowel sounds are normal. There is no distension.     Palpations: Abdomen is soft. There is no mass.     Tenderness: There is no abdominal tenderness. There is no guarding or rebound.  Musculoskeletal:        General: Tenderness present. Normal range of motion.     Cervical back: Normal range of motion.  Lymphadenopathy:     Cervical: No cervical adenopathy.  Skin:    General: Skin is warm and dry.     Findings: No rash.  Neurological:     Mental Status: He is alert and oriented to person, place, and time.     Cranial Nerves: No cranial nerve deficit.     Motor: No abnormal muscle tone.      Coordination: Coordination normal.     Gait: Gait abnormal.     Deep Tendon Reflexes: Reflexes are normal and symmetric.  Psychiatric:        Behavior: Behavior normal.        Thought Content: Thought content normal.  Judgment: Judgment normal.   A very stiff neck, painful; no ROM  Lab Results  Component Value Date   WBC 7.4 03/14/2023   HGB 14.0 03/14/2023   HCT 42.8 03/14/2023   PLT 318.0 03/14/2023   GLUCOSE 127 (H) 03/14/2023   CHOL 135 10/14/2021   TRIG 164 (H) 10/14/2021   HDL 42 10/14/2021   LDLDIRECT 30.0 11/10/2020   LDLCALC 60 10/14/2021   ALT 18 03/14/2023   AST 30 03/14/2023   NA 138 03/14/2023   K 4.6 03/14/2023   CL 102 03/14/2023   CREATININE 1.11 03/14/2023   BUN 15 03/14/2023   CO2 31 03/14/2023   TSH 0.52 12/12/2022   PSA 1.38 06/10/2019   INR 2.4 05/25/2023   HGBA1C 7.1 (H) 03/14/2023    No results found.  Assessment & Plan:   Problem List Items Addressed This Visit     PAF (paroxysmal atrial fibrillation) (HCC)   No relapse On ASA, Coumadin       Diabetes mellitus, type 2 (HCC)   On steroids - started this am      Cervical pain - Primary   Severe pain  X ray Norco prn  Potential benefits of a short term opioids use as well as potential risks (i.e. addiction risk, apnea etc) and complications (i.e. Somnolence, constipation and others) were explained to the patient and were aknowledged. Diazepam  prn  Potential benefits of a short term benzodiazepines  use as well as potential risks  and complications were explained to the patient and were aknowledged. Hold Atorvastatin  Foam neck brace       Relevant Orders   DG Cervical Spine Complete      Meds ordered this encounter  Medications   HYDROcodone -acetaminophen  (NORCO) 7.5-325 MG tablet    Sig: Take 1 tablet by mouth every 4 (four) hours as needed for severe pain (pain score 7-10).    Dispense:  20 tablet    Refill:  0   diazepam  (VALIUM ) 5 MG tablet    Sig: Take 1 tablet  (5 mg total) by mouth every 6 (six) hours as needed for anxiety.    Dispense:  40 tablet    Refill:  1      Follow-up: Return in about 2 weeks (around 06/29/2023) for a follow-up visit.  Anitra Barn, MD

## 2023-06-15 NOTE — Assessment & Plan Note (Signed)
 No relapse On ASA, Coumadin

## 2023-06-15 NOTE — Assessment & Plan Note (Signed)
 Severe pain  X ray Norco prn  Potential benefits of a short term opioids use as well as potential risks (i.e. addiction risk, apnea etc) and complications (i.e. Somnolence, constipation and others) were explained to the patient and were aknowledged. Diazepam  prn  Potential benefits of a short term benzodiazepines  use as well as potential risks  and complications were explained to the patient and were aknowledged. Hold Atorvastatin  Foam neck brace

## 2023-06-16 ENCOUNTER — Telehealth: Payer: Self-pay | Admitting: Pharmacy Technician

## 2023-06-16 ENCOUNTER — Ambulatory Visit: Payer: Self-pay | Admitting: Internal Medicine

## 2023-06-16 ENCOUNTER — Other Ambulatory Visit (HOSPITAL_COMMUNITY): Payer: Self-pay

## 2023-06-16 NOTE — Telephone Encounter (Signed)
 Pharmacy Patient Advocate Encounter   Received notification from Onbase that prior authorization for HYDROCODONE /APAP 7.5/325MG  TABLETS is required/requested.   Insurance verification completed.   The patient is insured through [employer] .   Per test claim: Refill too soon. PA is not needed at this time. Medication was filled 06/15/2023. Next eligible fill date is 06/18/2023.

## 2023-06-19 DIAGNOSIS — H01021 Squamous blepharitis right upper eyelid: Secondary | ICD-10-CM | POA: Diagnosis not present

## 2023-06-19 DIAGNOSIS — H01024 Squamous blepharitis left upper eyelid: Secondary | ICD-10-CM | POA: Diagnosis not present

## 2023-06-19 DIAGNOSIS — B880 Other acariasis: Secondary | ICD-10-CM | POA: Diagnosis not present

## 2023-06-19 MED ORDER — HYDROCODONE-ACETAMINOPHEN 7.5-325 MG PO TABS: 1.0000 | ORAL_TABLET | ORAL | 0 refills | Status: DC | PRN

## 2023-07-02 DIAGNOSIS — E119 Type 2 diabetes mellitus without complications: Secondary | ICD-10-CM | POA: Diagnosis not present

## 2023-07-06 ENCOUNTER — Ambulatory Visit: Attending: Thoracic Surgery (Cardiothoracic Vascular Surgery) | Admitting: *Deleted

## 2023-07-06 DIAGNOSIS — Z9889 Other specified postprocedural states: Secondary | ICD-10-CM | POA: Diagnosis not present

## 2023-07-06 DIAGNOSIS — Z8679 Personal history of other diseases of the circulatory system: Secondary | ICD-10-CM | POA: Diagnosis not present

## 2023-07-06 DIAGNOSIS — I480 Paroxysmal atrial fibrillation: Secondary | ICD-10-CM | POA: Diagnosis not present

## 2023-07-06 LAB — POCT INR: POC INR: 2.8

## 2023-07-06 NOTE — Patient Instructions (Signed)
 Description   Continue taking warfarin 3 tablets daily except 2 tablets each Sundays, Tuesdays, and Thursdays. Repeat INR in 6 weeks. Anticoagulation Clinic [phone]

## 2023-07-07 DIAGNOSIS — Z85020 Personal history of malignant carcinoid tumor of stomach: Secondary | ICD-10-CM | POA: Diagnosis not present

## 2023-07-07 DIAGNOSIS — F419 Anxiety disorder, unspecified: Secondary | ICD-10-CM | POA: Diagnosis not present

## 2023-07-12 ENCOUNTER — Other Ambulatory Visit: Payer: Self-pay | Admitting: Internal Medicine

## 2023-07-18 DIAGNOSIS — I2510 Atherosclerotic heart disease of native coronary artery without angina pectoris: Secondary | ICD-10-CM | POA: Diagnosis not present

## 2023-07-18 DIAGNOSIS — E119 Type 2 diabetes mellitus without complications: Secondary | ICD-10-CM | POA: Diagnosis not present

## 2023-07-18 DIAGNOSIS — I480 Paroxysmal atrial fibrillation: Secondary | ICD-10-CM | POA: Diagnosis not present

## 2023-07-18 DIAGNOSIS — I10 Essential (primary) hypertension: Secondary | ICD-10-CM | POA: Diagnosis not present

## 2023-07-25 DIAGNOSIS — G4733 Obstructive sleep apnea (adult) (pediatric): Secondary | ICD-10-CM | POA: Diagnosis not present

## 2023-07-27 ENCOUNTER — Other Ambulatory Visit: Payer: Self-pay

## 2023-07-27 DIAGNOSIS — I480 Paroxysmal atrial fibrillation: Secondary | ICD-10-CM

## 2023-07-27 MED ORDER — WARFARIN SODIUM 2.5 MG PO TABS: ORAL_TABLET | ORAL | 0 refills | Status: DC

## 2023-07-27 NOTE — Telephone Encounter (Signed)
 Prescription refill request received for warfarin Lov: 12/27/22 Sheri)  Next INR check: 08/17/23 Warfarin tablet strength: 2.5mg   Appropriate dose. Refill sent.

## 2023-07-28 NOTE — Progress Notes (Signed)
 Anticoag encounter

## 2023-08-01 DIAGNOSIS — E119 Type 2 diabetes mellitus without complications: Secondary | ICD-10-CM | POA: Diagnosis not present

## 2023-08-08 ENCOUNTER — Other Ambulatory Visit (INDEPENDENT_AMBULATORY_CARE_PROVIDER_SITE_OTHER)

## 2023-08-08 ENCOUNTER — Ambulatory Visit

## 2023-08-08 VITALS — BP 120/80 | HR 68 | Ht 70.0 in | Wt 197.0 lb

## 2023-08-08 DIAGNOSIS — Z1159 Encounter for screening for other viral diseases: Secondary | ICD-10-CM

## 2023-08-08 DIAGNOSIS — E1159 Type 2 diabetes mellitus with other circulatory complications: Secondary | ICD-10-CM

## 2023-08-08 DIAGNOSIS — Z0000 Encounter for general adult medical examination without abnormal findings: Secondary | ICD-10-CM | POA: Diagnosis not present

## 2023-08-08 DIAGNOSIS — K635 Polyp of colon: Secondary | ICD-10-CM | POA: Diagnosis not present

## 2023-08-08 LAB — MICROALBUMIN / CREATININE URINE RATIO
Creatinine,U: 56.9 mg/dL
Microalb Creat Ratio: 70.2 mg/g — ABNORMAL HIGH (ref 0.0–30.0)
Microalb, Ur: 4 mg/dL — ABNORMAL HIGH (ref 0.0–1.9)

## 2023-08-08 NOTE — Progress Notes (Addendum)
 Subjective:   Jon [name] is a 78 y.o. who presents for a Medicare Wellness preventive visit.  As a reminder, Annual Wellness Visits don't include a physical exam, and some assessments may be limited, especially if this visit is performed virtually. We may recommend an in-person follow-up visit with your provider if needed.  Visit Complete: In person  Persons Participating in Visit: Patient.  AWV Questionnaire: No: Patient Medicare AWV questionnaire was not completed prior to this visit.  Cardiac Risk Factors include: advanced age (>43men, >62 women);diabetes mellitus;dyslipidemia;hypertension;male gender     Objective:    Today's Vitals   08/08/23 1001  BP: 120/80  Pulse: 68  SpO2: 97%  Weight: 197 lb (89.4 kg)  Height: 5' 10 (1.778 m)   Body mass index is 28.27 kg/m.     08/08/2023   10:01 AM 06/13/2023   11:02 PM 06/13/2023    5:43 PM 02/08/2023    8:59 AM 02/07/2022    8:57 AM 10/14/2021   11:00 AM 08/31/2021    8:50 AM  Advanced Directives  Does Patient Have a Medical Advance Directive? Yes No No No No Yes No  Type of Estate agent of [place];Living will     Healthcare Power of Attorney   Does patient want to make changes to medical advance directive?    No - Patient declined No - Patient declined No - Patient declined   Copy of Healthcare Power of Attorney in Chart? No - copy requested     No - copy requested   Would patient like information on creating a medical advance directive?       No - Patient declined    Current Medications (verified) Outpatient Encounter Medications as of 08/08/2023  Medication Sig   acetaminophen  (TYLENOL ) 500 MG tablet Take 500-1,000 mg by mouth daily as needed for headache or mild pain (pain score 1-3).   aspirin  EC 81 MG tablet Take 1 tablet (81 mg total) by mouth daily. Swallow whole.   atorvastatin  (LIPITOR ) 80 MG tablet Take 0.5 tablets (40 mg total) by mouth daily.   Cholecalciferol (VITAMIN D-3 PO) Take 1  capsule by mouth daily.   diazepam  (VALIUM ) 5 MG tablet Take 1 tablet (5 mg total) by mouth every 6 (six) hours as needed for anxiety.   diclofenac  Sodium (VOLTAREN ) 1 % GEL Apply 4 g topically 4 (four) times daily.   EPINEPHrine  0.3 mg/0.3 mL IJ SOAJ injection Inject 0.3 mg into the muscle as needed for anaphylaxis.   methocarbamol  (ROBAXIN ) 500 MG tablet Take 1 tablet (500 mg total) by mouth 2 (two) times daily.   Multiple Vitamins-Minerals (MULTIVITAMIN ADULTS 50+ PO) Take 1 tablet by mouth daily.   nateglinide  (STARLIX ) 60 MG tablet Take 1 tablet (60 mg total) by mouth 3 (three) times daily with meals.   omeprazole  (PRILOSEC) 40 MG capsule Take 1 capsule (40 mg total) by mouth daily.   spironolactone  (ALDACTONE ) 25 MG tablet Take 1 tablet (25 mg total) by mouth daily.   torsemide  (DEMADEX ) 10 MG tablet Take 1 tablet (10 mg total) by mouth daily.   warfarin (COUMADIN ) 2.5 MG tablet TAKE 2 TO 3 TABLETS BY MOUTH DAILY AS DIRECTED BY COUMADIN  CLINIC   doxycycline  (VIBRAMYCIN ) 100 MG capsule Take 1 capsule (100 mg total) by mouth 2 (two) times daily. One po bid x 7 days (Patient not taking: Reported on 08/08/2023)   HYDROcodone -acetaminophen  (NORCO) 7.5-325 MG tablet Take 1 tablet by mouth every 4 (four) hours as  needed for severe pain (pain score 7-10). (Patient not taking: Reported on 08/08/2023)   methylPREDNISolone  (MEDROL  DOSEPAK) 4 MG TBPK tablet Day 1: 8mg  before breakfast, 4 mg after lunch, 4 mg after supper, and 8 mg at bedtime Day 2: 4 mg before breakfast, 4 mg after lunch, 4 mg  after supper, and 8 mg  at bedtime Day 3:  4 mg  before breakfast, 4 mg  after lunch, 4 mg after supper, and 4 mg  at bedtime Day 4: 4 mg  before breakfast, 4 mg  after lunch, and 4 mg at bedtime Day 5: 4 mg  before breakfast and 4 mg at bedtime Day 6: 4 mg  before breakfast (Patient not taking: Reported on 08/08/2023)   No facility-administered encounter medications on file as of 08/08/2023.    Allergies  (verified) Fenofibrate    History: Past Medical History:  Diagnosis Date   Acute intractable headache 09/02/2021   Adenomatous colon polyp    Anemia    Atrial fibrillation (HCC)    Cataract    Diabetes mellitus without complication (HCC)    Diverticulosis    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HCC) 2012   Gastroesophageal /radiation Tx and Chemo   GERD (gastroesophageal reflux disease)    Glaucoma    H. pylori infection 2011   Heart murmur    History of radiation therapy 09/20/10 thru 10/29/10   gastroesophageal junction/gastric cardia   Hypertension    Neuromuscular disorder (HCC)    Other and unspecified hyperlipidemia    Sleep apnea    On CPAP   Status post chemotherapy 09/22/10 thru 10/27/10   concurrent w/radiation   Unspecified sleep apnea    Past Surgical History:  Procedure Laterality Date   CATARACT EXTRACTION     bilateral   COLONOSCOPY     CORONARY ARTERY BYPASS GRAFT N/A 10/22/2021   Procedure: CORONARY ARTERY BYPASS GRAFTING (CABG) X THREE, USING LEFT INTERNAL MAMMARY ARTERY AND RIGHT LEG GREATER SAPHENOUS VEIN HARVESTED ENDOSCOPICALLY;  Surgeon: Maryjane Mt, MD;  Location: [facility] OR;  Service: Open Heart Surgery;  Laterality: N/A;   CORONARY BALLOON ANGIOPLASTY N/A 10/13/2021   Procedure: CORONARY BALLOON ANGIOPLASTY;  Surgeon: Wonda Sharper, MD;  Location: [facility] INVASIVE CV LAB;  Service: Cardiovascular;  Laterality: N/A;   CORONARY BALLOON ANGIOPLASTY N/A 10/15/2021   Procedure: CORONARY BALLOON ANGIOPLASTY;  Surgeon: Wendel Lurena POUR, MD;  Location: [facility] INVASIVE CV LAB;  Service: Cardiovascular;  Laterality: N/A;   ELBOW BURSA SURGERY     LEFT HEART CATH AND CORONARY ANGIOGRAPHY N/A 10/13/2021   Procedure: LEFT HEART CATH AND CORONARY ANGIOGRAPHY;  Surgeon: Wonda Sharper, MD;  Location: [facility] INVASIVE CV LAB;  Service: Cardiovascular;  Laterality: N/A;   LEFT HEART CATH AND CORONARY ANGIOGRAPHY N/A 10/15/2021   Procedure: LEFT HEART CATH AND CORONARY ANGIOGRAPHY;  Surgeon: Wendel Lurena POUR, MD;  Location: [facility] INVASIVE CV LAB;  Service: Cardiovascular;  Laterality: N/A;   LEG SURGERY  1980   right leg    MAZE N/A 10/22/2021   Procedure: MAZE;  Surgeon: Maryjane Mt, MD;  Location: [facility] OR;  Service: Open Heart Surgery;  Laterality: N/A;   MITRAL VALVE REPAIR N/A 10/22/2021   Procedure: MITRAL VALVE REPAIR (MVR) USING MCCARTHY-ADAMS RING SIZE ;  Surgeon: Maryjane Mt, MD;  Location: [facility] OR;  Service: Open Heart Surgery;  Laterality: N/A;   SKIN BIOPSY  08/22/11   shave biopsy =benign   TEE WITHOUT CARDIOVERSION N/A 10/22/2021   Procedure: TRANSESOPHAGEAL ECHOCARDIOGRAM (TEE);  Surgeon: Maryjane Mt, MD;  Location: [facility] OR;  Service: Open Heart Surgery;  Laterality: N/A;   TONSILLECTOMY     Family History  Problem Relation Age of Onset   Arthritis Mother    Diabetes Father    Diabetes Brother    Coronary artery disease Neg Hx    Colon cancer Neg Hx    Esophageal cancer Neg Hx    Rectal cancer Neg Hx    Stomach cancer Neg Hx    Social History   Socioeconomic History   Marital status: Widowed    Spouse name: Not on file   Number of children: 2   Years of education: Not on file   Highest education level: Not on file  Occupational History   Occupation: retired    Associate Professor: RETIRED  Tobacco Use   Smoking status: Former    Current packs/day: 0.00    Average packs/day: 1 pack/day for 40.0 years (40.0 ttl pk-yrs)    Types: Cigarettes    Start date: 01/31/1965    Quit date: 01/31/2005    Years since quitting: 18.5   Smokeless tobacco: Never  Substance and Sexual Activity   Alcohol use: Not Currently    Alcohol/week: 1.0 standard drink of alcohol    Types: 1 Glasses of wine per week    Comment: 2 drinks last 6 months per pt   Drug use: No   Sexual activity: Yes  Other Topics Concern   Not on file  Social History Narrative   Widowed   Social Drivers of Health   Financial Resource Strain: Low Risk  (08/08/2023)   Overall Financial Resource Strain (CARDIA)     Difficulty of Paying Living Expenses: Not hard at all  Food Insecurity: No Food Insecurity (08/08/2023)   Hunger Vital Sign    Worried About Running Out of Food in the Last Year: Never true    Ran Out of Food in the Last Year: Never true  Transportation Needs: No Transportation Needs (08/08/2023)   PRAPARE - Administrator, Civil Service (Medical): No    Lack of Transportation (Non-Medical): No  Physical Activity: Sufficiently Active (08/08/2023)   Exercise Vital Sign    Days of Exercise per Week: 7 days    Minutes of Exercise per Session: 60 min  Stress: No Stress Concern Present (08/08/2023)   [employer] of Occupational Health - Occupational Stress Questionnaire    Feeling of Stress: Not at all  Social Connections: Moderately Integrated (08/08/2023)   Social Connection and Isolation Panel    Frequency of Communication with Friends and Family: More than three times a week    Frequency of Social Gatherings with Friends and Family: Once a week    Attends Religious Services: 1 to 4 times per year    Active Member of [employer] or Organizations: Yes    Attends Banker Meetings: 1 to 4 times per year    Marital Status: Widowed    Tobacco Counseling Counseling given: No    Clinical Intake:  Pre-visit preparation completed: Yes  Pain : No/denies pain     BMI - recorded: 28.27 Nutritional Status: BMI 25 -29 Overweight Nutritional Risks: None Diabetes: Yes CBG done?: Yes CBG resulted in Enter/ Edit results?: Yes (fasting - 108) Did pt. bring in CBG monitor from home?: No  Lab Results  Component Value Date   HGBA1C 6.9 (H) 06/15/2023   HGBA1C 7.1 (H) 03/14/2023   HGBA1C 7.0 (H) 12/12/2022     How often do you need to  have someone help you when you read instructions, pamphlets, or other written materials from your doctor or pharmacy?: 1 - Never  Interpreter Needed?: No  Information entered by :: Verdie Saba, CMA   Activities of Daily Living      08/08/2023   10:06 AM  In your present state of health, do you have any difficulty performing the following activities:  Hearing? 0  Vision? 0  Difficulty concentrating or making decisions? 0  Walking or climbing stairs? 0  Dressing or bathing? 0  Doing errands, shopping? 0  Preparing Food and eating ? N  Using the Toilet? N  In the past six months, have you accidently leaked urine? N  Do you have problems with loss of bowel control? N  Managing your Medications? N  Managing your Finances? N  Housekeeping or managing your Housekeeping? N    Patient Care Team: Plotnikov, Karlynn GAILS, MD as PCP - General Abran Norleen SAILOR, MD (Gastroenterology) Okey Vina GAILS, MD (Cardiology) Clance, Francis HERO, MD (Pulmonary Disease) Cloretta Arley NOVAK, MD as Consulting Physician (Internal Medicine) Dewey Norleen, MD as Consulting Physician (Radiation Oncology) Aron Shoulders, MD as Consulting Physician (General Surgery) Abran Norleen SAILOR, MD as Consulting Physician (Gastroenterology) Brantley Nancyann SQUIBB, MD (Thoracic Surgery) Okey Vina GAILS, MD as Consulting Physician (Cardiology) Lelon Lonni BIRCH, MD as Consulting Physician (Ophthalmology) Fleeta Zerita DASEN, MD as Consulting Physician (Ophthalmology)  I have updated your Care Teams any recent Medical Services you may have received from other providers in the past year.     Assessment:   This is a routine wellness examination for Jon [name].  Hearing/Vision screen Hearing Screening - Comments:: Denies hearing difficulties   Vision Screening - Comments:: Wears rx glasses - up to date with routine eye exams with Dr Fleeta   Goals Addressed               This Visit's Progress     Patient Stated (pt-stated)        Patient stated he plans to continue exercising daily       Depression Screen     08/08/2023   10:07 AM 12/12/2022    1:35 PM 08/09/2022    2:03 PM 05/10/2022    2:19 PM 04/15/2022   12:35 PM 01/21/2022    8:44 AM 01/20/2022   11:34 AM  PHQ 2/9  Scores  PHQ - 2 Score 0 0 0 0 0 0 0  PHQ- 9 Score 0          Fall Risk     08/08/2023   10:06 AM 12/12/2022    1:34 PM 08/09/2022    2:03 PM 05/10/2022    2:19 PM 04/15/2022   10:11 AM  Fall Risk   Falls in the past year? 0 0 0 0 0  Number falls in past yr: 0 0 0 0 0  Injury with Fall? 0 0 0 0 0  Risk for fall due to : No Fall Risks No Fall Risks No Fall Risks No Fall Risks Impaired balance/gait;Impaired mobility  Follow up Falls evaluation completed;Falls prevention discussed Falls evaluation completed Falls evaluation completed Falls evaluation completed Falls evaluation completed    MEDICARE RISK AT HOME:  Medicare Risk at Home Any stairs in or around the home?: No If so, are there any without handrails?: No Home free of loose throw rugs in walkways, pet beds, electrical cords, etc?: Yes Adequate lighting in your home to reduce risk of falls?: Yes Life alert?: No Use of  a cane, walker or w/c?: No Grab bars in the bathroom?: No Shower chair or bench in shower?: No Elevated toilet seat or a handicapped toilet?: No  TIMED UP AND GO:  Was the test performed?  No  Cognitive Function: 6CIT completed    06/18/2015   10:31 AM  MMSE - Mini Mental State Exam  Not completed: --        08/08/2023   10:09 AM 08/31/2021    9:02 AM  6CIT Screen  What Year? 0 points 0 points  What month? 0 points 0 points  What time? 0 points 0 points  Count back from 20 0 points 0 points  Months in reverse 0 points 0 points  Repeat phrase 0 points 0 points  Total Score 0 points 0 points    Immunizations Immunization History  Administered Date(s) Administered   PFIZER(Purple Top)SARS-COV-2 Vaccination 03/23/2019, 04/16/2019, 12/23/2019, 09/02/2020   Pfizer Covid-19 Vaccine Bivalent Booster 45yrs & up 02/08/2021   Pfizer(Comirnaty )Fall Seasonal Vaccine 12 years and older 02/08/2022, 02/08/2023   Pneumococcal Conjugate-13 12/18/2012, 12/16/2019   Pneumococcal Polysaccharide-23 03/15/2011,  03/21/2017   Td 03/18/2004, 06/18/2014   Zoster Recombinant(Shingrix) 01/30/2017, 04/05/2017   Zoster, Live 12/31/2009    Screening Tests Health Maintenance  Topic Date Due   Diabetic kidney evaluation - Urine ACR  Never done   Hepatitis C Screening  Never done   FOOT EXAM  12/18/2013   Colonoscopy  06/20/2023   COVID-19 Vaccine (8 - Pfizer risk 2024-25 season) 08/08/2023   HEMOGLOBIN A1C  12/16/2023   OPHTHALMOLOGY EXAM  04/23/2024   Diabetic kidney evaluation - eGFR measurement  06/14/2024   DTaP/Tdap/Td (3 - Tdap) 06/17/2024   Medicare Annual Wellness (AWV)  08/07/2024   Pneumococcal Vaccine: 50+ Years  Completed   Zoster Vaccines- Shingrix  Completed   Hepatitis B Vaccines  Aged Out   HPV VACCINES  Aged Out   Meningococcal B Vaccine  Aged Out   INFLUENZA VACCINE  Discontinued    Health Maintenance  Health Maintenance Due  Topic Date Due   Diabetic kidney evaluation - Urine ACR  Never done   Hepatitis C Screening  Never done   FOOT EXAM  12/18/2013   Colonoscopy  06/20/2023   COVID-19 Vaccine (8 - Pfizer risk 2024-25 season) 08/08/2023   Health Maintenance Items Addressed:  Labs Ordered: Hepatitis C Screening and Diabetic Kidney Urine ACR  Referral to  GI - Dr Abran to have a repeat Colonoscopy (h/o Colon Polyps)  Additional Screening:  Vision Screening: Recommended annual ophthalmology exams for early detection of glaucoma and other disorders of the eye. Would you like a referral to an eye doctor? No    Dental Screening: Recommended annual dental exams for proper oral hygiene  Community Resource Referral / Chronic Care Management: CRR required this visit?  No   CCM required this visit?  No   Plan:    I have personally reviewed and noted the following in the patient's chart:   Medical and social history Use of alcohol, tobacco or illicit drugs  Current medications and supplements including opioid prescriptions. Patient is not currently taking  opioid prescriptions. Functional ability and status Nutritional status Physical activity Advanced directives List of other physicians Hospitalizations, surgeries, and ER visits in previous 12 months Vitals Screenings to include cognitive, depression, and falls Referrals and appointments  In addition, I have reviewed and discussed with patient certain preventive protocols, quality metrics, and best practice recommendations. A written personalized care plan for preventive services  as well as general preventive health recommendations were provided to patient.   Verdie CHRISTELLA Saba, CMA   08/08/2023   After Visit Summary: (In Person-Printed) AVS printed and given to the patient  Notes: Nothing significant to report at this time.  Medical screening examination/treatment/procedure(s) were performed by non-physician practitioner and as supervising physician I was immediately available for consultation/collaboration.  I agree with above. Karlynn Noel, MD

## 2023-08-08 NOTE — Patient Instructions (Addendum)
 [name] , Thank you for taking time out of your busy schedule to complete your Annual Wellness Visit with me. I enjoyed our conversation and look forward to speaking with you again next year. I, as well as your care team,  appreciate your ongoing commitment to your health goals. Please review the following plan we discussed and let me know if I can assist you in the future. Your Game plan/ To Do List    Referrals: If you haven't heard from the office you've been referred to, please reach out to them at the phone provided.  Ordered labs - Diabetic Kidney Urine ACR and Hepatitis C Screening; Referral to [facility] for a repeat Colonoscopy. Follow up Visits: Next Medicare AWV with our clinical staff: 08/09/2024   Have you seen your provider in the last 6 months (3 months if uncontrolled diabetes)? Yes Next Office Visit with your provider: 09/11/2023  Clinician Recommendations:  Aim for 30 minutes of exercise or brisk walking, 6-8 glasses of water, and 5 servings of fruits and vegetables each day.       This is a list of the screening recommended for you and due dates:  Health Maintenance  Topic Date Due   Yearly kidney health urinalysis for diabetes  Never done   Hepatitis C Screening  Never done   Complete foot exam   12/18/2013   Colon Cancer Screening  06/20/2023   COVID-19 Vaccine (8 - Pfizer risk 2024-25 season) 08/08/2023   Hemoglobin A1C  12/16/2023   Eye exam for diabetics  04/23/2024   Yearly kidney function blood test for diabetes  06/14/2024   DTaP/Tdap/Td vaccine (3 - Tdap) 06/17/2024   Medicare Annual Wellness Visit  08/07/2024   Pneumococcal Vaccine for age over 45  Completed   Zoster (Shingles) Vaccine  Completed   Hepatitis B Vaccine  Aged Out   HPV Vaccine  Aged Out   Meningitis B Vaccine  Aged Out   Flu Shot  Discontinued    Advanced directives: (Copy Requested) Please bring a copy of your health care power of attorney and living will to the office to be added  to your chart at your convenience. You can mail to [facility] [address] [place], [place] [address] or email to [email or web address] Advance Care Planning is important because it:  [x]  Makes sure you receive the medical care that is consistent with your values, goals, and preferences  [x]  It provides guidance to your family and loved ones and reduces their decisional burden about whether or not they are making the right decisions based on your wishes.  Follow the link provided in your after visit summary or read over the paperwork we have mailed to you to help you started getting your Advance Directives in place. If you need assistance in completing these, please reach out to us  so that we can help you!

## 2023-08-09 LAB — HEPATITIS C ANTIBODY: Hepatitis C Ab: NONREACTIVE

## 2023-08-11 ENCOUNTER — Ambulatory Visit: Payer: Self-pay | Admitting: Internal Medicine

## 2023-08-17 ENCOUNTER — Ambulatory Visit: Attending: Cardiology | Admitting: *Deleted

## 2023-08-17 DIAGNOSIS — Z5181 Encounter for therapeutic drug level monitoring: Secondary | ICD-10-CM | POA: Diagnosis not present

## 2023-08-17 DIAGNOSIS — Z8679 Personal history of other diseases of the circulatory system: Secondary | ICD-10-CM | POA: Diagnosis not present

## 2023-08-17 DIAGNOSIS — I480 Paroxysmal atrial fibrillation: Secondary | ICD-10-CM

## 2023-08-17 DIAGNOSIS — Z9889 Other specified postprocedural states: Secondary | ICD-10-CM | POA: Diagnosis not present

## 2023-08-17 LAB — POCT INR: POC INR: 2.1

## 2023-08-17 NOTE — Patient Instructions (Signed)
 Description   Continue taking warfarin 3 tablets daily except 2 tablets each Sundays, Tuesdays, and Thursdays. Repeat INR in 6 weeks. Anticoagulation Clinic [phone]

## 2023-08-17 NOTE — Progress Notes (Signed)
Please see anticoagulation encounter.

## 2023-09-01 DIAGNOSIS — E119 Type 2 diabetes mellitus without complications: Secondary | ICD-10-CM | POA: Diagnosis not present

## 2023-09-02 ENCOUNTER — Encounter: Payer: Self-pay | Admitting: Internal Medicine

## 2023-09-08 DIAGNOSIS — I10 Essential (primary) hypertension: Secondary | ICD-10-CM | POA: Diagnosis not present

## 2023-09-08 DIAGNOSIS — I2510 Atherosclerotic heart disease of native coronary artery without angina pectoris: Secondary | ICD-10-CM | POA: Diagnosis not present

## 2023-09-08 DIAGNOSIS — I480 Paroxysmal atrial fibrillation: Secondary | ICD-10-CM | POA: Diagnosis not present

## 2023-09-11 ENCOUNTER — Ambulatory Visit: Payer: Medicare Other | Admitting: Internal Medicine

## 2023-09-11 ENCOUNTER — Encounter: Payer: Self-pay | Admitting: Internal Medicine

## 2023-09-11 VITALS — BP 102/65 | HR 49 | Temp 97.7°F | Ht 70.0 in | Wt 198.0 lb

## 2023-09-11 DIAGNOSIS — I480 Paroxysmal atrial fibrillation: Secondary | ICD-10-CM

## 2023-09-11 DIAGNOSIS — Z0000 Encounter for general adult medical examination without abnormal findings: Secondary | ICD-10-CM

## 2023-09-11 DIAGNOSIS — R809 Proteinuria, unspecified: Secondary | ICD-10-CM | POA: Insufficient documentation

## 2023-09-11 DIAGNOSIS — C160 Malignant neoplasm of cardia: Secondary | ICD-10-CM

## 2023-09-11 DIAGNOSIS — E1159 Type 2 diabetes mellitus with other circulatory complications: Secondary | ICD-10-CM

## 2023-09-11 DIAGNOSIS — E785 Hyperlipidemia, unspecified: Secondary | ICD-10-CM

## 2023-09-11 MED ORDER — DAPAGLIFLOZIN PROPANEDIOL 5 MG PO TABS: 5.0000 mg | ORAL_TABLET | Freq: Every day | ORAL | 11 refills | Status: AC

## 2023-09-11 NOTE — Assessment & Plan Note (Signed)
 No relapse On ASA, Coumadin 

## 2023-09-11 NOTE — Assessment & Plan Note (Signed)
  Cont on Lipitor , fenofibrate

## 2023-09-11 NOTE — Assessment & Plan Note (Addendum)
 New  Microalb Creat Ratio 70.2 High    Options discussed: Kerendia or Farxiga  Will start Farxiga 

## 2023-09-11 NOTE — Assessment & Plan Note (Signed)
Monitor A1c 

## 2023-09-11 NOTE — Assessment & Plan Note (Signed)
 F/u w/Dr Cloretta, Dr Abran

## 2023-09-11 NOTE — Progress Notes (Signed)
 Subjective:  Patient ID: KYIAN OBST, male    DOB: 1945-06-12  Age: 78 y.o. MRN: [id number]  CC: Annual Exam   HPI SURAJ RAMDASS presents for a well exam F/u on DM, neck pain, h/o cancer  Outpatient Medications Prior to Visit  Medication Sig Dispense Refill   acetaminophen  (TYLENOL ) 500 MG tablet Take 500-1,000 mg by mouth daily as needed for headache or mild pain (pain score 1-3).     aspirin  EC 81 MG tablet Take 1 tablet (81 mg total) by mouth daily. Swallow whole. 30 tablet 12   atorvastatin  (LIPITOR ) 80 MG tablet Take 0.5 tablets (40 mg total) by mouth daily. 90 tablet 3   Cholecalciferol (VITAMIN D-3 PO) Take 1 capsule by mouth daily.     diazepam  (VALIUM ) 5 MG tablet Take 1 tablet (5 mg total) by mouth every 6 (six) hours as needed for anxiety. 40 tablet 1   diclofenac  Sodium (VOLTAREN ) 1 % GEL Apply 4 g topically 4 (four) times daily. 100 g 0   EPINEPHrine  0.3 mg/0.3 mL IJ SOAJ injection Inject 0.3 mg into the muscle as needed for anaphylaxis. 1 each 0   methocarbamol  (ROBAXIN ) 500 MG tablet Take 1 tablet (500 mg total) by mouth 2 (two) times daily. 10 tablet 0   Multiple Vitamins-Minerals (MULTIVITAMIN ADULTS 50+ PO) Take 1 tablet by mouth daily.     nateglinide  (STARLIX ) 60 MG tablet Take 1 tablet (60 mg total) by mouth 3 (three) times daily with meals. 270 tablet 3   omeprazole  (PRILOSEC) 40 MG capsule Take 1 capsule (40 mg total) by mouth daily. 90 capsule 3   spironolactone  (ALDACTONE ) 25 MG tablet Take 1 tablet (25 mg total) by mouth daily. 30 tablet 3   torsemide  (DEMADEX ) 10 MG tablet Take 1 tablet (10 mg total) by mouth daily. 30 tablet 3   warfarin (COUMADIN ) 2.5 MG tablet TAKE 2 TO 3 TABLETS BY MOUTH DAILY AS DIRECTED BY COUMADIN  CLINIC 220 tablet 0   HYDROcodone -acetaminophen  (NORCO) 7.5-325 MG tablet Take 1 tablet by mouth every 4 (four) hours as needed for severe pain (pain score 7-10). (Patient not taking: Reported on 09/11/2023) 20 tablet 0   doxycycline   (VIBRAMYCIN ) 100 MG capsule Take 1 capsule (100 mg total) by mouth 2 (two) times daily. One po bid x 7 days (Patient not taking: Reported on 09/11/2023) 14 capsule 0   methylPREDNISolone  (MEDROL  DOSEPAK) 4 MG TBPK tablet Day 1: 8mg  before breakfast, 4 mg after lunch, 4 mg after supper, and 8 mg at bedtime Day 2: 4 mg before breakfast, 4 mg after lunch, 4 mg  after supper, and 8 mg  at bedtime Day 3:  4 mg  before breakfast, 4 mg  after lunch, 4 mg after supper, and 4 mg  at bedtime Day 4: 4 mg  before breakfast, 4 mg  after lunch, and 4 mg at bedtime Day 5: 4 mg  before breakfast and 4 mg at bedtime Day 6: 4 mg  before breakfast (Patient not taking: Reported on 09/11/2023) 1 each 0   No facility-administered medications prior to visit.    ROS: Review of Systems  Constitutional:  Negative for appetite change, fatigue and unexpected weight change.  HENT:  Negative for congestion, nosebleeds, sneezing, sore throat and trouble swallowing.   Eyes:  Negative for itching and visual disturbance.  Respiratory:  Negative for cough.   Cardiovascular:  Negative for chest pain, palpitations and leg swelling.  Gastrointestinal:  Negative for abdominal distention,  blood in stool, diarrhea and nausea.  Genitourinary:  Negative for frequency and hematuria.  Musculoskeletal:  Positive for arthralgias. Negative for back pain, gait problem, joint swelling and neck pain.  Skin:  Negative for rash.  Neurological:  Negative for dizziness, tremors, speech difficulty and weakness.  Psychiatric/Behavioral:  Negative for agitation, dysphoric mood and sleep disturbance. The patient is not nervous/anxious.     Objective:  BP 102/65   Pulse (!) 49   Temp 97.7 F (36.5 C) (Oral)   Ht 5' 10 (1.778 m)   Wt 198 lb (89.8 kg)   SpO2 96%   BMI 28.41 kg/m   BP Readings from Last 3 Encounters:  09/11/23 102/65  08/08/23 120/80  06/15/23 118/70    Wt Readings from Last 3 Encounters:  09/11/23 198 lb (89.8 kg)   08/08/23 197 lb (89.4 kg)  06/15/23 197 lb (89.4 kg)    Physical Exam Constitutional:      General: He is not in acute distress.    Appearance: Normal appearance. He is well-developed.     Comments: NAD  Eyes:     Conjunctiva/sclera: Conjunctivae normal.     Pupils: Pupils are equal, round, and reactive to light.  Neck:     Thyroid : No thyromegaly.     Vascular: No JVD.  Cardiovascular:     Rate and Rhythm: Normal rate and regular rhythm.     Heart sounds: Normal heart sounds. No murmur heard.    No friction rub. No gallop.  Pulmonary:     Effort: Pulmonary effort is normal. No respiratory distress.     Breath sounds: Normal breath sounds. No wheezing or rales.  Chest:     Chest wall: No tenderness.  Abdominal:     General: Bowel sounds are normal. There is no distension.     Palpations: Abdomen is soft. There is no mass.     Tenderness: There is no abdominal tenderness. There is no guarding or rebound.  Musculoskeletal:        General: No tenderness. Normal range of motion.     Cervical back: Normal range of motion.  Lymphadenopathy:     Cervical: No cervical adenopathy.  Skin:    General: Skin is warm and dry.     Findings: No rash.  Neurological:     Mental Status: He is alert and oriented to person, place, and time.     Cranial Nerves: No cranial nerve deficit.     Motor: No abnormal muscle tone.     Coordination: Coordination normal.     Gait: Gait normal.     Deep Tendon Reflexes: Reflexes are normal and symmetric.  Psychiatric:        Behavior: Behavior normal.        Thought Content: Thought content normal.        Judgment: Judgment normal.     Lab Results  Component Value Date   WBC 7.4 03/14/2023   HGB 14.0 03/14/2023   HCT 42.8 03/14/2023   PLT 318.0 03/14/2023   GLUCOSE 138 (H) 06/15/2023   CHOL 135 10/14/2021   TRIG 164 (H) 10/14/2021   HDL 42 10/14/2021   LDLDIRECT 30.0 11/10/2020   LDLCALC 60 10/14/2021   ALT 18 03/14/2023   AST 30  03/14/2023   NA 132 (L) 06/15/2023   K 4.8 06/15/2023   CL 97 06/15/2023   CREATININE 1.05 06/15/2023   BUN 18 06/15/2023   CO2 28 06/15/2023   TSH 0.52 12/12/2022  PSA 1.38 06/10/2019   INR 2.1 08/17/2023   HGBA1C 6.9 (H) 06/15/2023   MICROALBUR 4.0 (H) 08/08/2023    No results found.  Assessment & Plan:   Problem List Items Addressed This Visit     Diabetes mellitus, type 2 (HCC)   Monitor A1c      Relevant Medications   dapagliflozin  propanediol (FARXIGA ) 5 MG TABS tablet   Other Relevant Orders   Comprehensive metabolic panel with GFR   Hemoglobin A1c   Dyslipidemia    Cont on Lipitor , fenofibrate       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HCC)   F/u w/Dr [facility], Dr Abran      Microalbuminuria   New  Microalb Creat Ratio 70.2 High    Options discussed: Kerendia or Farxiga  Will start Farxiga       PAF (paroxysmal atrial fibrillation) (HCC)   No relapse On ASA, Coumadin       Well adult exam - Primary    We discussed age appropriate health related issues, including available/recomended screening tests and vaccinations. Labs were ordered to be later reviewed . All questions were answered. We discussed one or more of the following - seat belt use, use of sunscreen/sun exposure exercise, fall risk reduction, second hand smoke exposure, firearm use and storage, seat belt use, a need for adhering to healthy diet and exercise. Labs were ordered.  All questions were answered. Last colon 2020 Dr Abran          Meds ordered this encounter  Medications   dapagliflozin  propanediol (FARXIGA ) 5 MG TABS tablet    Sig: Take 1 tablet (5 mg total) by mouth daily before breakfast.    Dispense:  30 tablet    Refill:  11      Follow-up: Return in about 3 months (around 12/12/2023) for a follow-up visit.  Marolyn Noel, MD

## 2023-09-11 NOTE — Assessment & Plan Note (Signed)
  We discussed age appropriate health related issues, including available/recomended screening tests and vaccinations. Labs were ordered to be later reviewed . All questions were answered. We discussed one or more of the following - seat belt use, use of sunscreen/sun exposure exercise, fall risk reduction, second hand smoke exposure, firearm use and storage, seat belt use, a need for adhering to healthy diet and exercise. Labs were ordered.  All questions were answered. Last colon 2020 Dr Abran

## 2023-09-28 ENCOUNTER — Ambulatory Visit: Attending: Thoracic Surgery (Cardiothoracic Vascular Surgery) | Admitting: *Deleted

## 2023-09-28 DIAGNOSIS — I480 Paroxysmal atrial fibrillation: Secondary | ICD-10-CM | POA: Diagnosis not present

## 2023-09-28 DIAGNOSIS — Z8679 Personal history of other diseases of the circulatory system: Secondary | ICD-10-CM

## 2023-09-28 DIAGNOSIS — Z9889 Other specified postprocedural states: Secondary | ICD-10-CM

## 2023-09-28 DIAGNOSIS — Z5181 Encounter for therapeutic drug level monitoring: Secondary | ICD-10-CM

## 2023-09-28 LAB — POCT INR: POC INR: 2.3

## 2023-09-28 NOTE — Patient Instructions (Signed)
 Description   Continue taking warfarin 3 tablets daily except 2 tablets each Sundays, Tuesdays, and Thursdays. Repeat INR in 6 weeks. Anticoagulation Clinic [phone]

## 2023-09-28 NOTE — Progress Notes (Signed)
 INR 2.3,  Please see anticoagulation encounter  Lab Results  Component Value Date   INR 2.3 09/28/2023   INR 2.1 08/17/2023   INR 2.8 07/06/2023    Description   Continue taking warfarin 3 tablets daily except 2 tablets each Sundays, Tuesdays, and Thursdays. Repeat INR in 6 weeks. Anticoagulation Clinic [phone]

## 2023-10-02 DIAGNOSIS — E119 Type 2 diabetes mellitus without complications: Secondary | ICD-10-CM | POA: Diagnosis not present

## 2023-10-07 ENCOUNTER — Other Ambulatory Visit: Payer: Self-pay | Admitting: Internal Medicine

## 2023-10-07 DIAGNOSIS — I480 Paroxysmal atrial fibrillation: Secondary | ICD-10-CM

## 2023-11-01 DIAGNOSIS — E119 Type 2 diabetes mellitus without complications: Secondary | ICD-10-CM | POA: Diagnosis not present

## 2023-11-06 DIAGNOSIS — G4733 Obstructive sleep apnea (adult) (pediatric): Secondary | ICD-10-CM | POA: Diagnosis not present

## 2023-11-09 ENCOUNTER — Ambulatory Visit: Attending: Thoracic Surgery (Cardiothoracic Vascular Surgery)

## 2023-11-09 DIAGNOSIS — Z7901 Long term (current) use of anticoagulants: Secondary | ICD-10-CM

## 2023-11-09 DIAGNOSIS — I480 Paroxysmal atrial fibrillation: Secondary | ICD-10-CM | POA: Diagnosis not present

## 2023-11-09 DIAGNOSIS — Z8679 Personal history of other diseases of the circulatory system: Secondary | ICD-10-CM

## 2023-11-09 DIAGNOSIS — I059 Rheumatic mitral valve disease, unspecified: Secondary | ICD-10-CM

## 2023-11-09 DIAGNOSIS — Z9889 Other specified postprocedural states: Secondary | ICD-10-CM | POA: Diagnosis not present

## 2023-11-09 LAB — POCT INR: INR: 2 (ref 2.0–3.0)

## 2023-11-09 NOTE — Patient Instructions (Signed)
 Continue taking warfarin 3 tablets daily except 2 tablets each Sundays, Tuesdays, and Thursdays. Repeat INR in 6 weeks. Anticoagulation Clinic [phone]  Have office fax colonoscopy clearance to our office

## 2023-11-09 NOTE — Progress Notes (Signed)
 INR 2.0 Please see anticoagulation encounter Continue taking warfarin 3 tablets daily except 2 tablets each Sundays, Tuesdays, and Thursdays. Repeat INR in 6 weeks. Anticoagulation Clinic [phone]  Have office fax colonoscopy clearance to our office

## 2023-11-16 ENCOUNTER — Encounter: Payer: Self-pay | Admitting: Internal Medicine

## 2023-11-16 ENCOUNTER — Ambulatory Visit: Admitting: Internal Medicine

## 2023-11-16 ENCOUNTER — Telehealth: Payer: Self-pay

## 2023-11-16 VITALS — BP 110/72 | HR 68 | Ht 70.0 in | Wt 194.0 lb

## 2023-11-16 DIAGNOSIS — Z860100 Personal history of colon polyps, unspecified: Secondary | ICD-10-CM

## 2023-11-16 DIAGNOSIS — K219 Gastro-esophageal reflux disease without esophagitis: Secondary | ICD-10-CM

## 2023-11-16 DIAGNOSIS — Z7901 Long term (current) use of anticoagulants: Secondary | ICD-10-CM | POA: Diagnosis not present

## 2023-11-16 MED ORDER — NA SULFATE-K SULFATE-MG SULF 17.5-3.13-1.6 GM/177ML PO SOLN: 1.0000 | Freq: Once | ORAL | 0 refills | Status: AC

## 2023-11-16 NOTE — Progress Notes (Signed)
 HISTORY OF PRESENT ILLNESS:  Jon [name] is a 78 y.o. male with multiple significant medical problems as listed below including coronary artery disease and valvular heart disease with prior CABG, MAZE procedure and mitral valve repair.  He is on chronic Coumadin  therapy.  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  He has GERD, and is on omeprazole  40 mg daily with good control of symptoms.  Patient has a history of multiple adenomatous colon polyps.  Last colonoscopy 2020.  Follow-up in 5 years recommended.  Patient denies any active GI complaints.  Review of blood work from May 2025 shows hemoglobin A1c of 6.9.  Comprehensive metabolic panel from February 2025 shows normal liver test.  CBC at that time unremarkable with hemoglobin 14.0.  No issues with intubation during surgery.  Good EF  REVIEW OF SYSTEMS:  All non-GI ROS negative. Past Medical History:  Diagnosis Date   Acute intractable headache 09/02/2021   Adenomatous colon polyp    Anemia    Atrial fibrillation (HCC)    Cataract    Diabetes mellitus without complication (HCC)    Diverticulosis    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HCC) 2012   Gastroesophageal /radiation Tx and Chemo   GERD (gastroesophageal reflux disease)    Glaucoma    H. pylori infection 2011   Heart murmur    History of radiation therapy 09/20/10 thru 10/29/10   gastroesophageal junction/gastric cardia   Hypertension    Neuromuscular disorder (HCC)    Other and unspecified hyperlipidemia    Sleep apnea    On CPAP   Status post chemotherapy 09/22/10 thru 10/27/10   concurrent w/radiation   Unspecified sleep apnea     Past Surgical History:  Procedure Laterality Date   CATARACT EXTRACTION     bilateral   COLONOSCOPY     CORONARY ARTERY BYPASS GRAFT N/A 10/22/2021   Procedure: CORONARY ARTERY BYPASS GRAFTING (CABG) X THREE, USING LEFT INTERNAL MAMMARY ARTERY AND RIGHT LEG GREATER SAPHENOUS  VEIN HARVESTED ENDOSCOPICALLY;  Surgeon: Maryjane Mt, MD;  Location: [facility] OR;  Service: Open Heart Surgery;  Laterality: N/A;   CORONARY BALLOON ANGIOPLASTY N/A 10/13/2021   Procedure: CORONARY BALLOON ANGIOPLASTY;  Surgeon: Wonda Sharper, MD;  [facility] INV[facility] CV LAB;  Service: Cardiovascular;  Laterality: N/A;   CORONARY BALLOON ANGIOPLASTY N/A 10/15/2021   Procedure: CORONARY BALLOON ANGIOPLASTY;  Surgeon: Wendel Lurena POUR, MD;  Location: [facility] INVASIVE CV LAB;  Service: Cardiovascular;  Laterality: N/A;   ELBOW BURSA SURGERY     LEFT HEART CATH AND CORONARY ANGIOGRAPHY N/A 10/13/2021   Procedure: LEFT HEART CATH AND CORONARY ANGIOGRAPHY;  Surgeon: Wonda Sharper, MD;  Location: [facility] INVASIVE CV LAB;  Service: Cardiovascular;  Laterality: N/A;   LEFT HEART CATH AND CORONARY ANGIOGRAPHY N/A 10/15/2021   Procedure: LEFT HEART CATH AND CORONARY ANGIOGRAPHY;  Surgeon: Wendel Lurena POUR, MD;  Location: [facility] INVASIVE CV LAB;  Service: Cardiovascular;  Laterality: N/A;   LEG SURGERY  1980   right leg    MAZE N/A 10/22/2021   Procedure: MAZE;  Surgeon: Maryjane Mt, MD;  Location: [facility] OR;  Service: Open Heart Surgery;  Laterality: N/A;   MITRAL VALVE REPAIR N/A 10/22/2021   Procedure: MITRAL VALVE REPAIR (MVR) USING MCCARTHY-ADAMS RING SIZE ;  Surgeon: Maryjane Mt, MD;  Location: [facility] OR;  Service: Open Heart Surgery;  Laterality: N/A;   SKIN BIOPSY  08/22/11   shave biopsy =benign   TEE WITHOUT CARDIOVERSION N/A  10/22/2021   Procedure: TRANSESOPHAGEAL ECHOCARDIOGRAM (TEE);  Surgeon: Maryjane Mt, MD;  Location: [facility] OR;  Service: Open Heart Surgery;  Laterality: N/A;   TONSILLECTOMY      Social History [name]  reports that he quit smoking about 18 years ago. His smoking use included cigarettes. He started smoking about 58 years ago. He has a 40 pack-year smoking history. He has never used smokeless tobacco. He reports that he does not currently use alcohol after a past usage of about 1.0  standard drink of alcohol per week. He reports that he does not use drugs.  family history includes Arthritis in his mother; Diabetes in his brother and father.  Allergies  Allergen Reactions   Fenofibrate      Weakness, pain      PHYSICAL EXAMINATION: Vital signs: BP 110/72   Pulse 68   Ht 5' 10 (1.778 m)   Wt 194 lb (88 kg)   BMI 27.84 kg/m   Constitutional: generally well-appearing, no acute distress Psychiatric: alert and oriented x3, cooperative Eyes: extraocular movements intact, anicteric, conjunctiva pink Mouth: oral pharynx moist, no lesions Neck: supple no lymphadenopathy Cardiovascular: heart regular rate and rhythm, no murmur Lungs: clear to auscultation bilaterally Abdomen: soft, nontender, nondistended, no obvious ascites, no peritoneal signs, normal bowel sounds, no organomegaly Rectal: Deferred until colonoscopy Extremities: no lower extremity edema bilaterally Skin: no clubbing, cyanosis, or lesions on visible extremities Neuro: No focal deficits.  Cranial nerves intact  ASSESSMENT:  1.  Personal history of multiple adenomatous colon polyps.  Due for surveillance. 2.  Personal history of gastric cancer remotely.  Successfully treated 3.  Multiple medical problems 4.  Multiple cardiac problems with prior CABG, maze procedure, and valvular repair.  On Coumadin  5.  GERD.  On PPI  PLAN:  1.  Surveillance colonoscopy.  Patient is high risk given his comorbidities and the need to address chronic anticoagulation.  Will discuss management of his anticoagulation with his cardiologist Dr. Okey.The nature of the procedure, as well as the risks, benefits, and alternatives were carefully and thoroughly reviewed with the patient. Ample time for discussion and questions allowed. The patient understood, was satisfied, and agreed to proceed. 2.  Reflux precautions 3.  Continue PPI 4.  Ongoing general medical care with PCP and multiple specialists A total time of 60  minutes was spent preparing to see the patient, obtaining comprehensive history, performing medically appropriate physical exam, counseling and educating the patient regarding the above listed issues, ordering endoscopic procedure, conferring with other specialist regarding his anticoagulation, and documenting clinical information in the health record

## 2023-11-16 NOTE — Patient Instructions (Signed)
 You have been scheduled for a colonoscopy. Please follow written instructions given to you at your visit today.   If you use inhalers (even only as needed), please bring them with you on the day of your procedure.  DO NOT TAKE 7 DAYS PRIOR TO TEST- Trulicity (dulaglutide) Ozempic, Wegovy (semaglutide) Mounjaro (tirzepatide) Bydureon Bcise (exanatide extended release)  DO NOT TAKE 1 DAY PRIOR TO YOUR TEST Rybelsus (semaglutide) Adlyxin (lixisenatide) Victoza (liraglutide) Byetta (exanatide) ___________________________________________________________________________  _______________________________________________________  If your blood pressure at your visit was 140/90 or greater, please contact your primary care physician to follow up on this.  _______________________________________________________  If you are age 18 or older, your body mass index should be between 23-30. Your Body mass index is 27.84 kg/m. If this is out of the aforementioned range listed, please consider follow up with your Primary Care Provider.  If you are age 40 or younger, your body mass index should be between 19-25. Your Body mass index is 27.84 kg/m. If this is out of the aformentioned range listed, please consider follow up with your Primary Care Provider.   ________________________________________________________  The [facility] providers would like to encourage you to use MYCHART to communicate with providers for non-urgent requests or questions.  Due to long hold times on the telephone, sending your provider a message by [facility] may be a faster and more efficient way to get a response.  Please allow 48 business hours for a response.  Please remember that this is for non-urgent requests.  _______________________________________________________  [facility] is using a team-based approach to care.  Your team is made up of your doctor and two to three APPS. Our APPS (Nurse Practitioners and  Physician Assistants) work with your physician to ensure care continuity for you. They are fully qualified to address your health concerns and develop a treatment plan. They communicate directly with your gastroenterologist to care for you. Seeing the Advanced Practice Practitioners on your physician's team can help you by facilitating care more promptly, often allowing for earlier appointments, access to diagnostic testing, procedures, and other specialty referrals.

## 2023-11-16 NOTE — Telephone Encounter (Signed)
 [facility] Pre-operative Risk Assessment     Request for surgical clearance:     Endoscopy Procedure  What type of surgery is being performed?     Colonoscopy  When is this surgery scheduled?     12/20/2023  What type of clearance is required ?   Pharmacy  Are there any medications that need to be held prior to surgery and how long? Coumadin  - 5 days  Practice name and name of physician performing surgery?      [facility]  What is your office phone and fax number?      Phone- [phone]  Fax- [phone]  Anesthesia type (None, local, MAC, general) ?       MAC   Please route your response to [facility]

## 2023-11-21 DIAGNOSIS — N182 Chronic kidney disease, stage 2 (mild): Secondary | ICD-10-CM | POA: Diagnosis not present

## 2023-11-21 DIAGNOSIS — G4733 Obstructive sleep apnea (adult) (pediatric): Secondary | ICD-10-CM | POA: Diagnosis not present

## 2023-11-21 DIAGNOSIS — I480 Paroxysmal atrial fibrillation: Secondary | ICD-10-CM | POA: Diagnosis not present

## 2023-11-21 DIAGNOSIS — I2510 Atherosclerotic heart disease of native coronary artery without angina pectoris: Secondary | ICD-10-CM | POA: Diagnosis not present

## 2023-11-24 ENCOUNTER — Telehealth: Payer: Self-pay

## 2023-11-24 NOTE — Telephone Encounter (Signed)
   Patient Name: Jon [name]  DOB: [birth year] MRN: [id number]  Primary Cardiologist: Dr. Vina Gull  Clinical pharmacists have reviewed the patient's past medical history, labs, and current medications as part of preoperative protocol coverage. The following recommendations have been made:  Patient with diagnosis of A Fib on warfarin for anticoagulation.     Procedure: colonoscopy Date of procedure: 12/20/23     CHA2DS2-VASc Score = 5  This indicates a 7.2% annual risk of stroke. The patient's score is based upon: CHF History: 0 HTN History: 1 Diabetes History: 1 Stroke History: 0 Vascular Disease History: 1 Age Score: 2 Gender Score: 0     CrCl 72 ml/min Platelet count 318K   Patient has not had an Afib/aflutter ablation in the last 3 months, DCCV within the last 4 weeks or a watchman implanted in the last 45 days      Per office protocol, patient can hold warfarin for 5 days prior to procedure.     Patient will not need bridging with Lovenox  (enoxaparin ) around procedure.   I will route this recommendation to the requesting party via Epic fax function and remove from pre-op pool.  Please call with questions.  Lamarr Satterfield, NP 11/24/2023, 10:19 AM

## 2023-11-24 NOTE — Telephone Encounter (Signed)
 Lm on vm that per Dr. Okey patient could hold Coumadin  for 5 days prior to his procedure.  Asked that he call back to let me know he got this message.

## 2023-11-24 NOTE — Telephone Encounter (Signed)
 Patient with diagnosis of A Fib on warfarin for anticoagulation.    Procedure: colonoscopy Date of procedure: 12/20/23   CHA2DS2-VASc Score = 5  This indicates a 7.2% annual risk of stroke. The patient's score is based upon: CHF History: 0 HTN History: 1 Diabetes History: 1 Stroke History: 0 Vascular Disease History: 1 Age Score: 2 Gender Score: 0    CrCl 72 ml/min Platelet count 318K  Patient has not had an Afib/aflutter ablation in the last 3 months, DCCV within the last 4 weeks or a watchman implanted in the last 45 days    Per office protocol, patient can hold warfarin for 5 days prior to procedure.    Patient will not need bridging with Lovenox  (enoxaparin ) around procedure.  **This guidance is not considered finalized until pre-operative APP has relayed final recommendations.**

## 2023-11-28 NOTE — Telephone Encounter (Signed)
 Lm on vm

## 2023-11-29 NOTE — Telephone Encounter (Signed)
 noted

## 2023-11-29 NOTE — Telephone Encounter (Signed)
 Inbound call from patient advising he received the message. Not further questions.   Thank you

## 2023-12-02 DIAGNOSIS — E119 Type 2 diabetes mellitus without complications: Secondary | ICD-10-CM | POA: Diagnosis not present

## 2023-12-12 ENCOUNTER — Encounter: Payer: Self-pay | Admitting: Internal Medicine

## 2023-12-12 ENCOUNTER — Ambulatory Visit: Admitting: Internal Medicine

## 2023-12-12 VITALS — BP 118/76 | HR 58 | Temp 98.1°F | Ht 70.0 in | Wt 195.8 lb

## 2023-12-12 DIAGNOSIS — Z7901 Long term (current) use of anticoagulants: Secondary | ICD-10-CM

## 2023-12-12 DIAGNOSIS — I480 Paroxysmal atrial fibrillation: Secondary | ICD-10-CM

## 2023-12-12 DIAGNOSIS — E1159 Type 2 diabetes mellitus with other circulatory complications: Secondary | ICD-10-CM | POA: Diagnosis not present

## 2023-12-12 DIAGNOSIS — I4891 Unspecified atrial fibrillation: Secondary | ICD-10-CM | POA: Diagnosis not present

## 2023-12-12 LAB — COMPREHENSIVE METABOLIC PANEL WITH GFR
ALT: 19 U/L (ref 0–53)
AST: 30 U/L (ref 0–37)
Albumin: 4.6 g/dL (ref 3.5–5.2)
Alkaline Phosphatase: 80 U/L (ref 39–117)
BUN: 21 mg/dL (ref 6–23)
CO2: 29 meq/L (ref 19–32)
Calcium: 9.3 mg/dL (ref 8.4–10.5)
Chloride: 101 meq/L (ref 96–112)
Creatinine, Ser: 1.3 mg/dL (ref 0.40–1.50)
GFR: 52.62 mL/min — ABNORMAL LOW (ref >60.00)
Glucose, Bld: 116 mg/dL — ABNORMAL HIGH (ref 70–99)
Potassium: 4 meq/L (ref 3.5–5.1)
Sodium: 138 meq/L (ref 135–145)
Total Bilirubin: 0.7 mg/dL (ref 0.2–1.2)
Total Protein: 7.5 g/dL (ref 6.0–8.3)

## 2023-12-12 LAB — HEMOGLOBIN A1C: Hgb A1c MFr Bld: 7.1 % — ABNORMAL HIGH (ref 4.6–6.5)

## 2023-12-12 NOTE — Progress Notes (Signed)
 Subjective:  Patient ID: Jon [name], male    DOB: [birth year]  Age: 78 y.o. MRN: [id number]  CC: Medical Management of Chronic Issues (3 Month follow up)   HPI Jon [name] presents for DM, h/o stomach cancer, colon polyps, A fib on Coumadin   Outpatient Medications Prior to Visit  Medication Sig Dispense Refill   acetaminophen  (TYLENOL ) 500 MG tablet Take 500-1,000 mg by mouth daily as needed for headache or mild pain (pain score 1-3).     aspirin  EC 81 MG tablet Take 1 tablet (81 mg total) by mouth daily. Swallow whole. 30 tablet 12   atorvastatin  (LIPITOR ) 80 MG tablet Take 0.5 tablets (40 mg total) by mouth daily. 90 tablet 3   Cholecalciferol (VITAMIN D-3 PO) Take 1 capsule by mouth daily.     dapagliflozin  propanediol (FARXIGA ) 5 MG TABS tablet Take 1 tablet (5 mg total) by mouth daily before breakfast. 30 tablet 11   diclofenac  Sodium (VOLTAREN ) 1 % GEL Apply 4 g topically 4 (four) times daily. (Patient taking differently: Apply 1 Application topically 4 (four) times daily. As needed) 100 g 0   EPINEPHrine  0.3 mg/0.3 mL IJ SOAJ injection Inject 0.3 mg into the muscle as needed for anaphylaxis. 1 each 0   Multiple Vitamins-Minerals (MULTIVITAMIN ADULTS 50+ PO) Take 1 tablet by mouth daily.     nateglinide  (STARLIX ) 60 MG tablet Take 1 tablet (60 mg total) by mouth 3 (three) times daily with meals. 270 tablet 3   omeprazole  (PRILOSEC) 40 MG capsule TAKE 1 CAPSULE(40 MG) BY MOUTH DAILY 90 capsule 3   warfarin (COUMADIN ) 2.5 MG tablet TAKE 2 TO 3 TABLETS BY MOUTH DAILY AS DIRECTED BY COUMADIN  CLINIC 220 tablet 0   diazepam  (VALIUM ) 5 MG tablet Take 1 tablet (5 mg total) by mouth every 6 (six) hours as needed for anxiety. (Patient not taking: Reported on 12/12/2023) 40 tablet 1   HYDROcodone -acetaminophen  (NORCO) 7.5-325 MG tablet Take 1 tablet by mouth every 4 (four) hours as needed for severe pain (pain score 7-10). (Patient not taking: Reported on 12/12/2023) 20 tablet 0    methocarbamol  (ROBAXIN ) 500 MG tablet Take 1 tablet (500 mg total) by mouth 2 (two) times daily. (Patient not taking: Reported on 12/12/2023) 10 tablet 0   spironolactone  (ALDACTONE ) 25 MG tablet Take 1 tablet (25 mg total) by mouth daily. (Patient not taking: Reported on 12/12/2023) 30 tablet 3   torsemide  (DEMADEX ) 10 MG tablet Take 1 tablet (10 mg total) by mouth daily. (Patient not taking: Reported on 12/12/2023) 30 tablet 3   No facility-administered medications prior to visit.    ROS: Review of Systems  Constitutional:  Negative for appetite change, fatigue and unexpected weight change.  HENT:  Negative for congestion, nosebleeds, sneezing, sore throat and trouble swallowing.   Eyes:  Negative for itching and visual disturbance.  Respiratory:  Negative for cough.   Cardiovascular:  Negative for chest pain, palpitations and leg swelling.  Gastrointestinal:  Negative for abdominal distention, blood in stool, diarrhea and nausea.  Genitourinary:  Negative for frequency and hematuria.  Musculoskeletal:  Negative for back pain, gait problem, joint swelling and neck pain.  Skin:  Negative for rash.  Neurological:  Negative for dizziness, tremors, speech difficulty and weakness.  Psychiatric/Behavioral:  Negative for agitation, dysphoric mood and sleep disturbance. The patient is not nervous/anxious.     Objective:  BP 118/76   Pulse (!) 58   Temp 98.1 F (36.7 C)   Ht 5'  10 (1.778 m)   Wt 195 lb 12.8 oz (88.8 kg)   SpO2 99%   BMI 28.09 kg/m   BP Readings from Last 3 Encounters:  12/12/23 118/76  11/16/23 110/72  09/11/23 102/65    Wt Readings from Last 3 Encounters:  12/12/23 195 lb 12.8 oz (88.8 kg)  11/16/23 194 lb (88 kg)  09/11/23 198 lb (89.8 kg)    Physical Exam Constitutional:      General: He is not in acute distress.    Appearance: He is well-developed.     Comments: NAD  Eyes:     Conjunctiva/sclera: Conjunctivae normal.     Pupils: Pupils are equal,  round, and reactive to light.  Neck:     Thyroid : No thyromegaly.     Vascular: No JVD.  Cardiovascular:     Rate and Rhythm: Normal rate and regular rhythm.     Heart sounds: Normal heart sounds. No murmur heard.    No friction rub. No gallop.  Pulmonary:     Effort: Pulmonary effort is normal. No respiratory distress.     Breath sounds: Normal breath sounds. No wheezing or rales.  Chest:     Chest wall: No tenderness.  Abdominal:     General: Bowel sounds are normal. There is no distension.     Palpations: Abdomen is soft. There is no mass.     Tenderness: There is no abdominal tenderness. There is no guarding or rebound.  Musculoskeletal:        General: No tenderness. Normal range of motion.     Cervical back: Normal range of motion.     Right lower leg: No edema.     Left lower leg: No edema.  Lymphadenopathy:     Cervical: No cervical adenopathy.  Skin:    General: Skin is warm and dry.     Findings: No rash.  Neurological:     Mental Status: He is alert and oriented to person, place, and time.     Cranial Nerves: No cranial nerve deficit.     Motor: No abnormal muscle tone.     Coordination: Coordination normal.     Gait: Gait normal.     Deep Tendon Reflexes: Reflexes are normal and symmetric.  Psychiatric:        Behavior: Behavior normal.        Thought Content: Thought content normal.        Judgment: Judgment normal.     Lab Results  Component Value Date   WBC 7.4 03/14/2023   HGB 14.0 03/14/2023   HCT 42.8 03/14/2023   PLT 318.0 03/14/2023   GLUCOSE 138 (H) 06/15/2023   CHOL 135 10/14/2021   TRIG 164 (H) 10/14/2021   HDL 42 10/14/2021   LDLDIRECT 30.0 11/10/2020   LDLCALC 60 10/14/2021   ALT 18 03/14/2023   AST 30 03/14/2023   NA 132 (L) 06/15/2023   K 4.8 06/15/2023   CL 97 06/15/2023   CREATININE 1.05 06/15/2023   BUN 18 06/15/2023   CO2 28 06/15/2023   TSH 0.52 12/12/2022   PSA 1.38 06/10/2019   INR 2.0 11/09/2023   HGBA1C 6.9 (H)  06/15/2023   MICROALBUR 4.0 (H) 08/08/2023    No results found.  Assessment & Plan:   Problem List Items Addressed This Visit     Atrial fibrillation (HCC)   On ASA, Coumadin  Sop anticoagulation prior to colonoscopy as per Dr Okey      Diabetes mellitus, type 2 (HCC) -  Primary   Monitor A1c On Starlix  to 60 mg tid prn, Farxiga       Long term (current) use of anticoagulants   On ASA, Coumadin  Sop anticoagulation prior to colonoscopy as per Dr Okey      PAF (paroxysmal atrial fibrillation) (HCC)   On ASA, Coumadin          No orders of the defined types were placed in this encounter.     Follow-up: Return in about 3 months (around 03/13/2024) for a follow-up visit.  Marolyn Noel, MD

## 2023-12-12 NOTE — Assessment & Plan Note (Signed)
 Monitor A1c On Starlix  to 60 mg tid prn, Farxiga 

## 2023-12-12 NOTE — Assessment & Plan Note (Signed)
 On ASA, Coumadin

## 2023-12-12 NOTE — Assessment & Plan Note (Signed)
 On ASA, Coumadin  Sop anticoagulation prior to colonoscopy as per Dr Okey

## 2023-12-20 ENCOUNTER — Ambulatory Visit (AMBULATORY_SURGERY_CENTER): Admitting: Internal Medicine

## 2023-12-20 ENCOUNTER — Encounter: Payer: Self-pay | Admitting: Internal Medicine

## 2023-12-20 VITALS — BP 117/78 | HR 55 | Temp 98.3°F | Resp 14 | Ht 70.0 in | Wt 194.0 lb

## 2023-12-20 DIAGNOSIS — D123 Benign neoplasm of transverse colon: Secondary | ICD-10-CM

## 2023-12-20 DIAGNOSIS — Z85028 Personal history of other malignant neoplasm of stomach: Secondary | ICD-10-CM | POA: Diagnosis not present

## 2023-12-20 DIAGNOSIS — Z860101 Personal history of adenomatous and serrated colon polyps: Secondary | ICD-10-CM | POA: Diagnosis not present

## 2023-12-20 DIAGNOSIS — K648 Other hemorrhoids: Secondary | ICD-10-CM

## 2023-12-20 DIAGNOSIS — Z860100 Personal history of colon polyps, unspecified: Secondary | ICD-10-CM

## 2023-12-20 DIAGNOSIS — K5140 Inflammatory polyps of colon without complications: Secondary | ICD-10-CM | POA: Diagnosis not present

## 2023-12-20 DIAGNOSIS — K5730 Diverticulosis of large intestine without perforation or abscess without bleeding: Secondary | ICD-10-CM | POA: Diagnosis not present

## 2023-12-20 DIAGNOSIS — D122 Benign neoplasm of ascending colon: Secondary | ICD-10-CM

## 2023-12-20 DIAGNOSIS — Z1211 Encounter for screening for malignant neoplasm of colon: Secondary | ICD-10-CM | POA: Diagnosis not present

## 2023-12-20 MED ORDER — DEXTROSE 5 % IV SOLN: INTRAVENOUS | Status: AC

## 2023-12-20 MED ORDER — SODIUM CHLORIDE 0.9 % IV SOLN: 500.0000 mL | INTRAVENOUS | Status: DC

## 2023-12-20 NOTE — Op Note (Signed)
 [facility] Patient Name: Jon [name] Procedure Date: 12/20/2023 11:16 AM MRN: [id number] Endoscopist: Norleen SAILOR. Abran , MD, [id number] Age: 78 Referring MD:  Date of Birth: [birth year] Gender: Male Account #: [id number] Procedure:                Colonoscopy with cold snare polypectomy x 5 Indications:              High risk colon cancer surveillance: Personal                            history of multiple (3 or more) adenomas. Previous                            examinations 2004, 2010, 2015, 2020 Medicines:                Monitored Anesthesia Care Procedure:                Pre-Anesthesia Assessment:                           - Prior to the procedure, a History and Physical                            was performed, and patient medications and                            allergies were reviewed. The patient's tolerance of                            previous anesthesia was also reviewed. The risks                            and benefits of the procedure and the sedation                            options and risks were discussed with the patient.                            All questions were answered, and informed consent                            was obtained. Prior Anticoagulants: The patient has                            taken Coumadin  (warfarin), last dose was 6 days                            prior to procedure. ASA Grade Assessment: III - A                            patient with severe systemic disease. After                            reviewing the risks and benefits, the patient was  deemed in satisfactory condition to undergo the                            procedure.                           After obtaining informed consent, the colonoscope                            was passed under direct vision. Throughout the                            procedure, the patient's blood pressure, pulse, and                            oxygen saturations  were monitored continuously. The                            CF [id number] #[id number] was introduced through the anus                            and advanced to the the cecum, identified by                            appendiceal orifice and ileocecal valve. The                            ileocecal valve, appendiceal orifice, and rectum                            were photographed. The quality of the bowel                            preparation was excellent. The colonoscopy was                            performed without difficulty. The patient tolerated                            the procedure well. The bowel preparation used was                            SUPREP via split dose instruction. Scope In: 11:35:52 AM Scope Out: 11:53:12 AM Scope Withdrawal Time: 0 hours 12 minutes 37 seconds  Total Procedure Duration: 0 hours 17 minutes 20 seconds  Findings:                 Five polyps were found in the transverse colon and                            ascending colon. The polyps were 2 to 5 mm in size.                            These polyps were removed with a cold snare.  Resection and retrieval were complete.                           Multiple diverticula were found in the sigmoid                            colon. Internal hemorrhoids.                           The exam was otherwise without abnormality on                            direct and retroflexion views. Complications:            No immediate complications. Estimated blood loss:                            None. Estimated Blood Loss:     Estimated blood loss: none. Impression:               - Five 2 to 5 mm polyps in the transverse colon and                            in the ascending colon, removed with a cold snare.                            Resected and retrieved.                           - Diverticulosis in the sigmoid colon. Internal                            hemorrhoids.                           - The  examination was otherwise normal on direct                            and retroflexion views. Recommendation:           - Repeat colonoscopy is not recommended for                            surveillance.                           - Resume Coumadin  (warfarin) today at prior dose.                           - Patient has a contact number available for                            emergencies. The signs and symptoms of potential                            delayed complications were discussed with the  patient. Return to normal activities tomorrow.                            Written discharge instructions were provided to the                            patient.                           - Resume previous diet.                           - Continue present medications.                           - Await pathology results. Norleen SAILOR. Abran, MD 12/20/2023 12:05:08 PM This report has been signed electronically.

## 2023-12-20 NOTE — Progress Notes (Signed)
 Pt's states no medical or surgical changes since previsit or office visit.

## 2023-12-20 NOTE — Progress Notes (Signed)
 Expand All Collapse All HISTORY OF PRESENT ILLNESS:   Jon [name] is a 78 y.o. male with multiple significant medical problems as listed below including coronary artery disease and valvular heart disease with prior CABG, MAZE procedure and mitral valve repair.  He is on chronic Coumadin  therapy.  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  He has GERD, and is on omeprazole  40 mg daily with good control of symptoms.   Patient has a history of multiple adenomatous colon polyps.  Last colonoscopy 2020.  Follow-up in 5 years recommended.  Patient denies any active GI complaints.   Review of blood work from May 2025 shows hemoglobin A1c of 6.9.  Comprehensive metabolic panel from February 2025 shows normal liver test.  CBC at that time unremarkable with hemoglobin 14.0.   No issues with intubation during surgery.  Good EF   REVIEW OF SYSTEMS:   All non-GI ROS negative.     Past Medical History:  Diagnosis Date   Acute intractable headache 09/02/2021   Adenomatous colon polyp     Anemia     Atrial fibrillation (HCC)     Cataract     Diabetes mellitus without complication (HCC)     Diverticulosis     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HCC) 2012    Gastroesophageal /radiation Tx and Chemo   GERD (gastroesophageal reflux disease)     Glaucoma     H. pylori infection 2011   Heart murmur     History of radiation therapy 09/20/10 thru 10/29/10    gastroesophageal junction/gastric cardia   Hypertension     Neuromuscular disorder (HCC)     Other and unspecified hyperlipidemia     Sleep apnea      On CPAP   Status post chemotherapy 09/22/10 thru 10/27/10    concurrent w/radiation   Unspecified sleep apnea                 Past Surgical History:  Procedure Laterality Date   CATARACT EXTRACTION        bilateral   COLONOSCOPY       CORONARY ARTERY BYPASS GRAFT N/A 10/22/2021    Procedure: CORONARY ARTERY BYPASS GRAFTING (CABG) X  THREE, USING LEFT INTERNAL MAMMARY ARTERY AND RIGHT LEG GREATER SAPHENOUS VEIN HARVESTED ENDOSCOPICALLY;  Surgeon: Maryjane Mt, MD;  Location: [facility] OR;  Service: Open Heart Surgery;  Laterality: N/A;   CORONARY BALLOON ANGIOPLASTY N/A 10/13/2021    Procedure: CORONARY BALLOON ANGIOPLASTY;  Surgeon: Wonda Sharper, MD;  Location: [facility] INVASIVE CV LAB;  Service: Cardiovascular;  Laterality: N/A;   CORONARY BALLOON ANGIOPLASTY N/A 10/15/2021    Procedure: CORONARY BALLOON ANGIOPLASTY;  Surgeon: Wendel Lurena POUR, MD;  Location: [facility] INVASIVE CV LAB;  Service: Cardiovascular;  Laterality: N/A;   ELBOW BURSA SURGERY       LEFT HEART CATH AND CORONARY ANGIOGRAPHY N/A 10/13/2021    Procedure: LEFT HEART CATH AND CORONARY ANGIOGRAPHY;  Surgeon: Wonda Sharper, MD;  Location: [facility] INVASIVE CV LAB;  Service: Cardiovascular;  Laterality: N/A;   LEFT HEART CATH AND CORONARY ANGIOGRAPHY N/A 10/15/2021    Procedure: LEFT HEART CATH AND CORONARY ANGIOGRAPHY;  Surgeon: Wendel Lurena POUR, MD;  Location: [facility] INVASIVE CV LAB;  Service: Cardiovascular;  Laterality: N/A;   LEG SURGERY   1980    right leg    MAZE N/A 10/22/2021    Procedure: MAZE;  Surgeon: Maryjane Mt, MD;  Location: [facility] OR;  Service: Open  Heart Surgery;  Laterality: N/A;   MITRAL VALVE REPAIR N/A 10/22/2021    Procedure: MITRAL VALVE REPAIR (MVR) USING MCCARTHY-ADAMS RING SIZE ;  Surgeon: Maryjane Mt, MD;  Location: [facility] OR;  Service: Open Heart Surgery;  Laterality: N/A;   SKIN BIOPSY   08/22/11    shave biopsy =benign   TEE WITHOUT CARDIOVERSION N/A 10/22/2021    Procedure: TRANSESOPHAGEAL ECHOCARDIOGRAM (TEE);  Surgeon: Maryjane Mt, MD;  Location: [facility] OR;  Service: Open Heart Surgery;  Laterality: N/A;   TONSILLECTOMY              Social History Jon [name]  reports that he quit smoking about 18 years ago. His smoking use included cigarettes. He started smoking about 58 years ago. He has a 40 pack-year smoking history. He has never used  smokeless tobacco. He reports that he does not currently use alcohol after a past usage of about 1.0 standard drink of alcohol per week. He reports that he does not use drugs.   family history includes Arthritis in his mother; Diabetes in his brother and father.   Allergies       Allergies  Allergen Reactions   Fenofibrate         Weakness, pain          PHYSICAL EXAMINATION: Vital signs: BP 110/72   Pulse 68   Ht 5' 10 (1.778 m)   Wt 194 lb (88 kg)   BMI 27.84 kg/m   Constitutional: generally well-appearing, no acute distress Psychiatric: alert and oriented x3, cooperative Eyes: extraocular movements intact, anicteric, conjunctiva pink Mouth: oral pharynx moist, no lesions Neck: supple no lymphadenopathy Cardiovascular: heart regular rate and rhythm, no murmur Lungs: clear to auscultation bilaterally Abdomen: soft, nontender, nondistended, no obvious ascites, no peritoneal signs, normal bowel sounds, no organomegaly Rectal: Deferred until colonoscopy Extremities: no lower extremity edema bilaterally Skin: no clubbing, cyanosis, or lesions on visible extremities Neuro: No focal deficits.  Cranial nerves intact   ASSESSMENT:   1.  Personal history of multiple adenomatous colon polyps.  Due for surveillance. 2.  Personal history of gastric cancer remotely.  Successfully treated 3.  Multiple medical problems 4.  Multiple cardiac problems with prior CABG, maze procedure, and valvular repair.  On Coumadin  5.  GERD.  On PPI   PLAN:   1.  Surveillance colonoscopy.  Patient is high risk given his comorbidities and the need to address chronic anticoagulation.  Will discuss management of his anticoagulation with his cardiologist Dr. Okey.The nature of the procedure, as well as the risks, benefits, and alternatives were carefully and thoroughly reviewed with the patient. Ample time for discussion and questions allowed. The patient understood, was satisfied, and agreed to proceed. 2.   Reflux precautions 3.  Continue PPI

## 2023-12-20 NOTE — Progress Notes (Signed)
 Called to room to assist during endoscopic procedure.  Patient ID and intended procedure confirmed with present staff. Received instructions for my participation in the procedure from the performing physician.

## 2023-12-20 NOTE — Progress Notes (Signed)
 Transferred to PACU via stretcher.  Not responding to stimulation at this time.  VSS upon leaving procedure room.

## 2023-12-20 NOTE — Progress Notes (Signed)
 Pt BS reported to this nurse 65. Pt asymptomatic stated that he did take his diabetes medication this am at 0615am. Reported to MD and CRNA who stated that pt was fine to proceed with pre assessment. Rechecked BS was 61. Instructed by CRNA to start D5% IV @KVO . Pt started on D5% IV @KVO . Rechecked BS after 15 min was 93. Reported to CRNA. Pt will proceed with procedure. Pt stable. No noted distress.

## 2023-12-20 NOTE — Patient Instructions (Signed)
 Repeat colonoscopy is not recommended for surveillance. Resume Coumadin  (warfarin) today at prior dose. Resume previous diet. Continue present medications. Awaiting pathology results. Handouts provided on polyps and diverticulosis.   YOU HAD AN ENDOSCOPIC PROCEDURE TODAY AT THE [facility]:   Refer to the procedure report that was given to you for any specific questions about what was found during the examination.  If the procedure report does not answer your questions, please call your gastroenterologist to clarify.  If you requested that your care partner not be given the details of your procedure findings, then the procedure report has been included in a sealed envelope for you to review at your convenience later.  YOU SHOULD EXPECT: Some feelings of bloating in the abdomen. Passage of more gas than usual.  Walking can help get rid of the air that was put into your GI tract during the procedure and reduce the bloating. If you had a lower endoscopy (such as a colonoscopy or flexible sigmoidoscopy) you may notice spotting of blood in your stool or on the toilet paper. If you underwent a bowel prep for your procedure, you may not have a normal bowel movement for a few days.  Please Note:  You might notice some irritation and congestion in your nose or some drainage.  This is from the oxygen used during your procedure.  There is no need for concern and it should clear up in a day or so.  SYMPTOMS TO REPORT IMMEDIATELY:  Following lower endoscopy (colonoscopy or flexible sigmoidoscopy):  Excessive amounts of blood in the stool  Significant tenderness or worsening of abdominal pains  Swelling of the abdomen that is new, acute  Fever of 100F or higher  For urgent or emergent issues, a gastroenterologist can be reached at any hour by calling [phone]. Do not use MyChart messaging for urgent concerns.    DIET:  We do recommend a small meal at first, but then you may proceed to  your regular diet.  Drink plenty of fluids but you should avoid alcoholic beverages for 24 hours.  ACTIVITY:  You should plan to take it easy for the rest of today and you should NOT DRIVE or use heavy machinery until tomorrow (because of the sedation medicines used during the test).    FOLLOW UP: Our staff will call the number listed on your records the next business day following your procedure.  We will call around 7:15- 8:00 am to check on you and address any questions or concerns that you may have regarding the information given to you following your procedure. If we do not reach you, we will leave a message.     If any biopsies were taken you will be contacted by phone or by letter within the next 1-3 weeks.  Please call us  at [phone] if you have not heard about the biopsies in 3 weeks.    SIGNATURES/CONFIDENTIALITY: You and/or your care partner have signed paperwork which will be entered into your electronic medical record.  These signatures attest to the fact that that the information above on your After Visit Summary has been reviewed and is understood.  Full responsibility of the confidentiality of this discharge information lies with you and/or your care-partner.

## 2023-12-21 ENCOUNTER — Telehealth: Payer: Self-pay

## 2023-12-21 ENCOUNTER — Ambulatory Visit

## 2023-12-21 NOTE — Telephone Encounter (Signed)
 Post procedure follow up call, no answer

## 2023-12-22 ENCOUNTER — Ambulatory Visit: Payer: Self-pay | Admitting: Internal Medicine

## 2023-12-22 LAB — SURGICAL PATHOLOGY

## 2023-12-23 ENCOUNTER — Ambulatory Visit: Payer: Self-pay | Admitting: Internal Medicine

## 2023-12-27 ENCOUNTER — Other Ambulatory Visit: Payer: Self-pay | Admitting: Internal Medicine

## 2023-12-27 DIAGNOSIS — I480 Paroxysmal atrial fibrillation: Secondary | ICD-10-CM

## 2024-01-03 ENCOUNTER — Ambulatory Visit: Attending: Cardiology | Admitting: *Deleted

## 2024-01-03 DIAGNOSIS — Z7901 Long term (current) use of anticoagulants: Secondary | ICD-10-CM | POA: Diagnosis not present

## 2024-01-03 DIAGNOSIS — I480 Paroxysmal atrial fibrillation: Secondary | ICD-10-CM

## 2024-01-03 DIAGNOSIS — Z8679 Personal history of other diseases of the circulatory system: Secondary | ICD-10-CM | POA: Diagnosis not present

## 2024-01-03 DIAGNOSIS — I059 Rheumatic mitral valve disease, unspecified: Secondary | ICD-10-CM

## 2024-01-03 DIAGNOSIS — I4891 Unspecified atrial fibrillation: Secondary | ICD-10-CM

## 2024-01-03 DIAGNOSIS — Z9889 Other specified postprocedural states: Secondary | ICD-10-CM

## 2024-01-03 LAB — POCT INR: INR: 2 (ref 2.0–3.0)

## 2024-01-03 NOTE — Patient Instructions (Signed)
 Description   INR-2.0; Continue taking warfarin 3 tablets daily except 2 tablets each Sundays, Tuesdays, and Thursdays. Repeat INR in 6 weeks. Anticoagulation Clinic [phone]

## 2024-01-03 NOTE — Progress Notes (Signed)
 Description   INR-2.0; Continue taking warfarin 3 tablets daily except 2 tablets each Sundays, Tuesdays, and Thursdays. Repeat INR in 6 weeks. Anticoagulation Clinic [phone]

## 2024-01-05 ENCOUNTER — Other Ambulatory Visit: Payer: Self-pay | Admitting: Internal Medicine

## 2024-01-08 DIAGNOSIS — K080 Exfoliation of teeth due to systemic causes: Secondary | ICD-10-CM | POA: Diagnosis not present

## 2024-02-02 ENCOUNTER — Telehealth: Payer: Self-pay | Admitting: Oncology

## 2024-02-02 NOTE — Telephone Encounter (Signed)
 Reached out to PT to let him know about rescheduled appt, voicemail is not set up

## 2024-02-07 ENCOUNTER — Other Ambulatory Visit: Payer: Medicare Other

## 2024-02-07 ENCOUNTER — Inpatient Hospital Stay: Attending: Oncology

## 2024-02-07 ENCOUNTER — Ambulatory Visit: Payer: Medicare Other | Admitting: Oncology

## 2024-02-07 DIAGNOSIS — Z8501 Personal history of malignant neoplasm of esophagus: Secondary | ICD-10-CM | POA: Diagnosis present

## 2024-02-07 DIAGNOSIS — Z923 Personal history of irradiation: Secondary | ICD-10-CM | POA: Diagnosis not present

## 2024-02-07 DIAGNOSIS — Z9221 Personal history of antineoplastic chemotherapy: Secondary | ICD-10-CM | POA: Diagnosis not present

## 2024-02-07 DIAGNOSIS — C169 Malignant neoplasm of stomach, unspecified: Secondary | ICD-10-CM

## 2024-02-07 DIAGNOSIS — Z08 Encounter for follow-up examination after completed treatment for malignant neoplasm: Secondary | ICD-10-CM | POA: Insufficient documentation

## 2024-02-07 LAB — CEA (ACCESS): CEA (CHCC): 1.93 ng/mL (ref 0.00–5.00)

## 2024-02-12 ENCOUNTER — Inpatient Hospital Stay: Admitting: Oncology

## 2024-02-12 ENCOUNTER — Other Ambulatory Visit (HOSPITAL_BASED_OUTPATIENT_CLINIC_OR_DEPARTMENT_OTHER): Payer: Self-pay

## 2024-02-12 VITALS — BP 111/76 | HR 60 | Temp 98.1°F | Resp 18 | Ht 70.0 in | Wt 199.0 lb

## 2024-02-12 DIAGNOSIS — Z08 Encounter for follow-up examination after completed treatment for malignant neoplasm: Secondary | ICD-10-CM | POA: Diagnosis not present

## 2024-02-12 DIAGNOSIS — C169 Malignant neoplasm of stomach, unspecified: Secondary | ICD-10-CM

## 2024-02-12 MED ORDER — COMIRNATY 30 MCG/0.3ML IM SUSY
0.3000 mL | PREFILLED_SYRINGE | Freq: Once | INTRAMUSCULAR | 0 refills | Status: AC
  Filled 2024-02-12: qty 0.3, 1d supply, fill #0

## 2024-02-12 NOTE — Progress Notes (Signed)
 " [facility] OFFICE PROGRESS NOTE   Diagnosis: Gastroesophageal cancer  INTERVAL HISTORY:   Jon [name] returns as scheduled.  He feels well.  Good appetite.  No dysphagia.  No complaint.  Objective:  Vital signs in last 24 hours:  Blood pressure 111/76, pulse 60, temperature 98.1 F (36.7 C), temperature source Temporal, resp. rate 18, height 5' 10 (1.778 m), weight 199 lb (90.3 kg), SpO2 100%.    Lymphatics: No cervical, supraclavicular, axillary, or inguinal nodes Resp: Inspiratory rhonchi at the low posterolateral chest on the left greater than right, no respiratory distress Cardio: Regular rate and rhythm, 2/6 systolic murmur GI: No hepatosplenomegaly, no mass, no apparent ascites, nontender Vascular: No leg edema   Lab Results:  Lab Results  Component Value Date   WBC 7.4 03/14/2023   HGB 14.0 03/14/2023   HCT 42.8 03/14/2023   MCV 81.0 03/14/2023   PLT 318.0 03/14/2023   NEUTROABS 4.4 03/14/2023    CMP  Lab Results  Component Value Date   NA 138 12/12/2023   K 4.0 12/12/2023   CL 101 12/12/2023   CO2 29 12/12/2023   GLUCOSE 116 (H) 12/12/2023   BUN 21 12/12/2023   CREATININE 1.30 12/12/2023   CALCIUM  9.3 12/12/2023   PROT 7.5 12/12/2023   ALBUMIN  4.6 12/12/2023   AST 30 12/12/2023   ALT 19 12/12/2023   ALKPHOS 80 12/12/2023   BILITOT 0.7 12/12/2023   GFRNONAA 58 (L) 11/02/2021   GFRAA 52 (L) 11/23/2015    Lab Results  Component Value Date   CEA1 1.02 02/07/2020   CEA 1.93 02/07/2024    Lab Results  Component Value Date   INR 2.0 01/03/2024   LABPROT 19.5 (H) 11/02/2021     Medications: I have reviewed the patient's current medications.   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 Staging CT scan 08/27/2010 revealed small gastrohepatic ligament, periportal and retrocrural lymph nodes without other evidence of  metastatic disease. Staging PET scan on 09/21/2010 showed abnormal malignant range FDG uptake at the site of the biopsy-proven gastric cancer. Small perigastric lymph nodes were not FDG avid above the background level. The radiologist commented that this may reflect a benign etiology or a false-negative appearance due to the small lymph node size. There was no evidence for distant metastatic disease. The CEA was markedly elevated at 482 on 09/14/2010. He completed radiation 09/20/2010 through 10/29/2010. He received concurrent weekly Taxol/carboplatin chemotherapy 09/22/2010 through 10/27/2010. Restaging PET scan 11/22/2010 showed interval resolution of previously demonstrated hypermetabolic activity within the proximal stomach. There was no evidence of metastatic disease. Restaging PET scan 05/10/2011 showed no evidence of gastroesophageal carcinoma recurrence or metastasis. Status post upper endoscopy 12/17/2010-the very distal portion of the esophagus was ulcerated. The proximal stomach revealed a malignant appearing ulcerative mass in the region of the cardia/gastroesophageal junction. The overall bulk of the lesion was significantly improved but still quite abnormal and large. Multiple biopsies were taken. Pathology was negative for malignancy. Elevated CEA (482) on 09/14/2010.   Dysphagia secondary to the lower esophagus/upper gastric tumor, resolved. Microcytic anemia. The hemoglobin was normal on 03/31/2011. There was persistent red cell microcytosis. Hemoglobin and MCV are normal Sleep apnea. Early diabetes. History of atrial fibrillation. Hyperlipidemia. History of colon polyps. History of Helicobacter pylori infection in 2011. History of neutropenia secondary to chemotherapy, resolved. CT of the chest on 02/15/2011 with left lower lung density/air bronchograms-? Radiation change. Colonoscopy  03/27/2013. Mild melanosis found throughout the entire examined colon. 2 diminutive polyps found in  the descending colon (Tubular adenoma. No high-grade dysplasia or malignancy noted). Moderate diverticulosis in the left colon. Followup colonoscopy 06/20/2018- polyps removed from the descending and transverse colon, tubular adenomas Nodular hyperpigmented mole at the right upper back .   Referred to dermatology 02/07/2020 CAD-status post coronary artery bypass surgery September 2023 Mitral valve repair September 2023       Disposition: Jon [name] remains in clinical remission from gastroesophageal cancer.  He would like to continue follow-up at the Cancer center.  He will return for an office visit in 1 year.  Arley Hof, MD  02/12/2024  3:10 PM   "

## 2024-02-14 ENCOUNTER — Ambulatory Visit: Attending: Cardiology | Admitting: *Deleted

## 2024-02-14 DIAGNOSIS — Z7901 Long term (current) use of anticoagulants: Secondary | ICD-10-CM

## 2024-02-14 DIAGNOSIS — Z9889 Other specified postprocedural states: Secondary | ICD-10-CM | POA: Diagnosis not present

## 2024-02-14 DIAGNOSIS — I349 Nonrheumatic mitral valve disorder, unspecified: Secondary | ICD-10-CM | POA: Diagnosis not present

## 2024-02-14 DIAGNOSIS — I480 Paroxysmal atrial fibrillation: Secondary | ICD-10-CM | POA: Diagnosis not present

## 2024-02-14 DIAGNOSIS — Z8679 Personal history of other diseases of the circulatory system: Secondary | ICD-10-CM

## 2024-02-14 DIAGNOSIS — I059 Rheumatic mitral valve disease, unspecified: Secondary | ICD-10-CM

## 2024-02-14 DIAGNOSIS — I4891 Unspecified atrial fibrillation: Secondary | ICD-10-CM

## 2024-02-14 LAB — POCT INR: INR: 2.6 (ref 2.0–3.0)

## 2024-02-14 NOTE — Progress Notes (Signed)
 Description   INR-2.6; Continue taking warfarin 3 tablets daily except 2 tablets each Sundays, Tuesdays, and Thursdays. Repeat INR in 6 weeks. Anticoagulation Clinic [phone]

## 2024-02-14 NOTE — Patient Instructions (Signed)
 Description   INR-2.6; Continue taking warfarin 3 tablets daily except 2 tablets each Sundays, Tuesdays, and Thursdays. Repeat INR in 6 weeks. Anticoagulation Clinic [phone]

## 2024-02-18 ENCOUNTER — Other Ambulatory Visit: Payer: Self-pay

## 2024-02-18 ENCOUNTER — Encounter (HOSPITAL_BASED_OUTPATIENT_CLINIC_OR_DEPARTMENT_OTHER): Payer: Self-pay

## 2024-02-18 ENCOUNTER — Emergency Department (HOSPITAL_BASED_OUTPATIENT_CLINIC_OR_DEPARTMENT_OTHER): Admission: EM | Admit: 2024-02-18 | Discharge: 2024-02-18 | Disposition: A | Discharge status: Home / Self Care

## 2024-02-18 ENCOUNTER — Emergency Department (HOSPITAL_BASED_OUTPATIENT_CLINIC_OR_DEPARTMENT_OTHER): Admitting: Radiology

## 2024-02-18 DIAGNOSIS — Z7982 Long term (current) use of aspirin: Secondary | ICD-10-CM | POA: Diagnosis not present

## 2024-02-18 DIAGNOSIS — I10 Essential (primary) hypertension: Secondary | ICD-10-CM | POA: Insufficient documentation

## 2024-02-18 DIAGNOSIS — E119 Type 2 diabetes mellitus without complications: Secondary | ICD-10-CM | POA: Insufficient documentation

## 2024-02-18 DIAGNOSIS — Z7901 Long term (current) use of anticoagulants: Secondary | ICD-10-CM | POA: Insufficient documentation

## 2024-02-18 DIAGNOSIS — M25511 Pain in right shoulder: Secondary | ICD-10-CM | POA: Diagnosis present

## 2024-02-18 MED ORDER — LIDOCAINE 5 % EX PTCH: 1.0000 | MEDICATED_PATCH | CUTANEOUS | 0 refills | Status: AC

## 2024-02-18 MED ORDER — DICLOFENAC SODIUM 1 % EX GEL: 4.0000 g | Freq: Four times a day (QID) | CUTANEOUS | 0 refills | Status: AC

## 2024-02-18 MED ORDER — DICLOFENAC SODIUM 1 % EX GEL: 4.0000 g | Freq: Four times a day (QID) | CUTANEOUS | 0 refills | Status: DC

## 2024-02-18 MED ORDER — OXYCODONE-ACETAMINOPHEN 5-325 MG PO TABS
1.0000 | ORAL_TABLET | Freq: Once | ORAL | Status: AC
  Administered 2024-02-18: 1 via ORAL
  Filled 2024-02-18: qty 1

## 2024-02-18 MED ORDER — OXYCODONE-ACETAMINOPHEN 5-325 MG PO TABS: 1.0000 | ORAL_TABLET | Freq: Four times a day (QID) | ORAL | 0 refills | Status: AC | PRN

## 2024-02-18 MED ORDER — OXYCODONE-ACETAMINOPHEN 5-325 MG PO TABS: 1.0000 | ORAL_TABLET | Freq: Four times a day (QID) | ORAL | 0 refills | Status: DC | PRN

## 2024-02-18 MED ORDER — LIDOCAINE 5 % EX PTCH: 1.0000 | MEDICATED_PATCH | CUTANEOUS | 0 refills | Status: DC

## 2024-02-18 NOTE — ED Provider Notes (Signed)
 " [place] EMERGENCY DEPARTMENT AT [facility] Provider Note   CSN: [id number] Arrival date & time: 02/18/24  1140     Patient presents with: Arm Pain   Jon [name] is a 79 y.o. male.   79 year old male with past medical history of atrial fibrillation, diabetes, and hypertension presenting to the emergency department today with right shoulder pain.  The patient states has been hurting now for the past few days.  He states that he slept on it wrong last week and that he was also working under his house yesterday and that the pain was worse today.  He states that the pain is mostly there if he tries to move it actively.  Reports that he has not really had any fevers and denies any major injuries that he is aware of.   Arm Pain       Prior to Admission medications  Medication Sig Start Date End Date Taking? Authorizing Provider  lidocaine  (LIDODERM ) 5 % Place 1 patch onto the skin daily. Remove & Discard patch within 12 hours or as directed by MD 02/18/24  Yes Ula Prentice SAUNDERS, MD  oxyCODONE -acetaminophen  (PERCOCET/ROXICET) 5-325 MG tablet Take 1 tablet by mouth every 6 (six) hours as needed for severe pain (pain score 7-10). 02/18/24  Yes Ula Prentice SAUNDERS, MD  acetaminophen  (TYLENOL ) 500 MG tablet Take 500-1,000 mg by mouth daily as needed for headache or mild pain (pain score 1-3). Patient not taking: Reported on 02/12/2024    [provider]  aspirin  EC 81 MG tablet Take 1 tablet (81 mg total) by mouth daily. Swallow whole. 11/02/21   Raguel Con RAMAN, PA-C  atorvastatin  (LIPITOR ) 80 MG tablet Take 0.5 tablets (40 mg total) by mouth daily. 07/12/23   Plotnikov, Aleksei V, MD  Cholecalciferol (VITAMIN D-3 PO) Take 1 capsule by mouth daily.    [provider]  dapagliflozin  propanediol (FARXIGA ) 5 MG TABS tablet Take 1 tablet (5 mg total) by mouth daily before breakfast. 09/11/23   Plotnikov, Aleksei V, MD  diclofenac  Sodium (VOLTAREN ) 1 % GEL Apply 4 g  topically 4 (four) times daily. 02/18/24   Ula Prentice SAUNDERS, MD  EPINEPHrine  0.3 mg/0.3 mL IJ SOAJ injection Inject 0.3 mg into the muscle as needed for anaphylaxis. Patient not taking: Reported on 02/12/2024 08/22/21   Teresa Shelba SAUNDERS, NP  Multiple Vitamins-Minerals (MULTIVITAMIN ADULTS 50+ PO) Take 1 tablet by mouth daily.    [provider]  nateglinide  (STARLIX ) 60 MG tablet TAKE 1 TABLET(60 MG) BY MOUTH THREE TIMES DAILY WITH MEALS 01/07/24   Plotnikov, Aleksei V, MD  omeprazole  (PRILOSEC) 40 MG capsule TAKE 1 CAPSULE(40 MG) BY MOUTH DAILY 10/09/23   Plotnikov, Aleksei V, MD  warfarin (COUMADIN ) 2.5 MG tablet TAKE 2 TO 3 TABLETS BY MOUTH DAILY AS DIRECTED BY COUMADIN  CLINIC 12/27/23   Okey Vina GAILS, MD    Allergies: Fenofibrate     Review of Systems  Musculoskeletal:  Positive for arthralgias.  All other systems reviewed and are negative.   Updated Vital Signs BP 138/80 (BP Location: Left Arm)   Pulse 61   Temp 98.6 F (37 C)   Resp 18   Ht 5' 10 (1.778 m)   Wt 86.2 kg   SpO2 100%   BMI 27.26 kg/m   Physical Exam Vitals and nursing note reviewed.   Gen: NAD Eyes: PERRL, EOMI HEENT: no oropharyngeal swelling Neck: trachea midline Resp: clear to auscultation bilaterally Card: RRR, no murmurs, rubs, or gallops Abd:  nontender, nondistended Extremities: no calf tenderness, no edema, the patient has pain with active range of motion of his right shoulder, there is no significant pain with passive range of motion, positive empty can test and does seem to hurt worse with abduction Vascular: 2+ radial pulses bilaterally, 2+ DP pulses bilaterally Skin: no rashes Psyc: acting appropriately   (all labs ordered are listed, but only abnormal results are displayed) Labs Reviewed - No data to display  EKG: None  Radiology: DG Shoulder Right Result Date: 02/18/2024 EXAM: 1 VIEW(S) XRAY OF THE RIGHT SHOULDER 02/18/2024 12:16:00 PM COMPARISON: None available. CLINICAL HISTORY:  Pain Pain Pain FINDINGS: BONES AND JOINTS: There is mild glenohumeral arthrosis. Glenohumeral joint is normally aligned. No acute fracture. No malalignment. The [facility] joint is unremarkable. SOFT TISSUES: No abnormal calcifications. Visualized lung is unremarkable. IMPRESSION: 1. Mild glenohumeral arthrosis. Electronically signed by: Evalene Coho MD 02/18/2024 12:57 PM EST RP Workstation: [id number]     Procedures   Medications Ordered in the ED  oxyCODONE -acetaminophen  (PERCOCET/ROXICET) 5-325 MG per tablet 1 tablet (1 tablet Oral Given 02/18/24 1212)                                    Medical Decision Making 79 year old male with past medical history of diabetes and atrial fibrillation on anticoagulation presenting to the emergency department today with right shoulder pain.  Based on exam suspect this might be due to rotator cuff tendinopathy but given age will obtain x-ray to evaluate acute bony abnormalities such as lytic or blastic lesion or pathologic fracture.  Give patient Percocet for pain.  He does not really have any pain with passive range of motion so suspicion for septic arthritis is low at this time.  Will give Percocet for pain and reevaluate.  The patient's x-ray did show some degenerative changes.  Symptoms improved with medications.  He is discharged with orthopedic follow-up with return precautions.  Amount and/or Complexity of Data Reviewed Radiology: ordered.  Risk Prescription drug management.        Final diagnoses:  Acute pain of right shoulder    ED Discharge Orders          Ordered    oxyCODONE -acetaminophen  (PERCOCET/ROXICET) 5-325 MG tablet  Every 6 hours PRN        02/18/24 1204    diclofenac  Sodium (VOLTAREN ) 1 % GEL  4 times daily        02/18/24 1319    lidocaine  (LIDODERM ) 5 %  Every 24 hours        02/18/24 1321               Ula Prentice SAUNDERS, MD 02/18/24 1321  "

## 2024-02-18 NOTE — ED Triage Notes (Signed)
 Pt reports R arm pain from shoulder down to hand. Pt reports he was working under the house yesterday.

## 2024-02-18 NOTE — Discharge Instructions (Addendum)
 Your x-ray did show some wear and tear in your right shoulder.  Please use the Voltaren  gel and lidocaine  patches as the first-line treatment.  If you are still having pain you may take the Percocet.  Do not drive or drink alcohol while taking this as it may make you drowsy.  Please follow-up with the orthopedic doctor at the number provided.  Return to the ER for fevers or worsening symptoms.

## 2024-03-12 ENCOUNTER — Inpatient Hospital Stay

## 2024-03-12 ENCOUNTER — Inpatient Hospital Stay: Admitting: Oncology

## 2024-03-13 ENCOUNTER — Ambulatory Visit: Admitting: Internal Medicine

## 2024-03-27 ENCOUNTER — Ambulatory Visit

## 2024-08-09 ENCOUNTER — Ambulatory Visit

## 2025-02-11 ENCOUNTER — Inpatient Hospital Stay

## 2025-02-11 ENCOUNTER — Inpatient Hospital Stay: Admitting: Oncology
